# Patient Record
Sex: Female | Born: 1940
Health system: Southern US, Community
[De-identification: ages and names within clinical notes are randomized; demographics above are authoritative.]

## PROBLEM LIST (undated history)

## (undated) DIAGNOSIS — M5030 Other cervical disc degeneration, unspecified cervical region: Secondary | ICD-10-CM

## (undated) DIAGNOSIS — Z9889 Other specified postprocedural states: Secondary | ICD-10-CM

## (undated) DIAGNOSIS — R0600 Dyspnea, unspecified: Secondary | ICD-10-CM

## (undated) DIAGNOSIS — I700 Atherosclerosis of aorta: Secondary | ICD-10-CM

## (undated) DIAGNOSIS — F411 Generalized anxiety disorder: Secondary | ICD-10-CM

## (undated) DIAGNOSIS — K269 Duodenal ulcer, unspecified as acute or chronic, without hemorrhage or perforation: Secondary | ICD-10-CM

## (undated) DIAGNOSIS — Z8489 Family history of other specified conditions: Secondary | ICD-10-CM

## (undated) DIAGNOSIS — I10 Essential (primary) hypertension: Secondary | ICD-10-CM

## (undated) DIAGNOSIS — Z87898 Personal history of other specified conditions: Secondary | ICD-10-CM

## (undated) DIAGNOSIS — R519 Headache, unspecified: Secondary | ICD-10-CM

## (undated) DIAGNOSIS — M1990 Unspecified osteoarthritis, unspecified site: Secondary | ICD-10-CM

## (undated) DIAGNOSIS — J45909 Unspecified asthma, uncomplicated: Secondary | ICD-10-CM

## (undated) DIAGNOSIS — Z923 Personal history of irradiation: Secondary | ICD-10-CM

## (undated) DIAGNOSIS — Z9221 Personal history of antineoplastic chemotherapy: Secondary | ICD-10-CM

## (undated) DIAGNOSIS — K644 Residual hemorrhoidal skin tags: Secondary | ICD-10-CM

## (undated) DIAGNOSIS — R112 Nausea with vomiting, unspecified: Secondary | ICD-10-CM

## (undated) DIAGNOSIS — R001 Bradycardia, unspecified: Secondary | ICD-10-CM

## (undated) DIAGNOSIS — I351 Nonrheumatic aortic (valve) insufficiency: Secondary | ICD-10-CM

## (undated) DIAGNOSIS — E7800 Pure hypercholesterolemia, unspecified: Secondary | ICD-10-CM

## (undated) DIAGNOSIS — K859 Acute pancreatitis without necrosis or infection, unspecified: Secondary | ICD-10-CM

## (undated) DIAGNOSIS — C50919 Malignant neoplasm of unspecified site of unspecified female breast: Secondary | ICD-10-CM

## (undated) DIAGNOSIS — R51 Headache: Secondary | ICD-10-CM

## (undated) DIAGNOSIS — C801 Malignant (primary) neoplasm, unspecified: Secondary | ICD-10-CM

## (undated) DIAGNOSIS — E119 Type 2 diabetes mellitus without complications: Secondary | ICD-10-CM

## (undated) DIAGNOSIS — IMO0001 Reserved for inherently not codable concepts without codable children: Secondary | ICD-10-CM

## (undated) DIAGNOSIS — I639 Cerebral infarction, unspecified: Secondary | ICD-10-CM

## (undated) HISTORY — DX: Bradycardia, unspecified: R00.1

## (undated) HISTORY — DX: Acute pancreatitis without necrosis or infection, unspecified: K85.90

## (undated) HISTORY — DX: Cerebral infarction, unspecified: I63.9

## (undated) HISTORY — DX: Atherosclerosis of aorta: I70.0

## (undated) HISTORY — DX: Generalized anxiety disorder: F41.1

## (undated) HISTORY — PX: APPENDECTOMY: SHX54

## (undated) HISTORY — DX: Residual hemorrhoidal skin tags: K64.4

## (undated) HISTORY — DX: Headache, unspecified: R51.9

## (undated) HISTORY — DX: Pure hypercholesterolemia, unspecified: E78.00

## (undated) HISTORY — PX: ABDOMINAL HYSTERECTOMY: SHX81

## (undated) HISTORY — PX: CHOLECYSTECTOMY: SHX55

## (undated) HISTORY — DX: Malignant (primary) neoplasm, unspecified: C80.1

## (undated) HISTORY — DX: Headache: R51

## (undated) HISTORY — DX: Duodenal ulcer, unspecified as acute or chronic, without hemorrhage or perforation: K26.9

## (undated) HISTORY — DX: Nonrheumatic aortic (valve) insufficiency: I35.1

## (undated) HISTORY — PX: BREAST SURGERY: SHX581

## (undated) HISTORY — DX: Personal history of other specified conditions: Z87.898

## (undated) HISTORY — PX: TUBAL LIGATION: SHX77

## (undated) HISTORY — PX: OTHER SURGICAL HISTORY: SHX169

---

## 1991-09-13 HISTORY — PX: MASTECTOMY: SHX3

## 1998-01-17 ENCOUNTER — Emergency Department (HOSPITAL_COMMUNITY): Admission: EM | Admit: 1998-01-17 | Discharge: 1998-01-17 | Payer: Self-pay | Admitting: Emergency Medicine

## 1998-01-18 ENCOUNTER — Emergency Department (HOSPITAL_COMMUNITY): Admission: EM | Admit: 1998-01-18 | Discharge: 1998-01-18 | Payer: Self-pay | Admitting: Emergency Medicine

## 1998-04-08 ENCOUNTER — Other Ambulatory Visit: Admission: RE | Admit: 1998-04-08 | Discharge: 1998-04-08 | Payer: Self-pay | Admitting: Hematology and Oncology

## 1998-05-28 ENCOUNTER — Emergency Department (HOSPITAL_COMMUNITY): Admission: EM | Admit: 1998-05-28 | Discharge: 1998-05-28 | Payer: Self-pay | Admitting: Emergency Medicine

## 1999-08-26 ENCOUNTER — Encounter: Admission: RE | Admit: 1999-08-26 | Discharge: 1999-08-26 | Payer: Self-pay | Admitting: Hematology and Oncology

## 1999-08-26 ENCOUNTER — Encounter: Payer: Self-pay | Admitting: Hematology and Oncology

## 1999-10-04 ENCOUNTER — Encounter: Admission: RE | Admit: 1999-10-04 | Discharge: 1999-10-04 | Payer: Self-pay | Admitting: Family Medicine

## 1999-10-04 ENCOUNTER — Encounter: Payer: Self-pay | Admitting: Family Medicine

## 1999-11-29 ENCOUNTER — Encounter: Admission: RE | Admit: 1999-11-29 | Discharge: 1999-11-29 | Payer: Self-pay | Admitting: Hematology and Oncology

## 1999-11-29 ENCOUNTER — Encounter: Payer: Self-pay | Admitting: Oncology

## 2001-05-11 ENCOUNTER — Encounter: Payer: Self-pay | Admitting: Oncology

## 2001-05-11 ENCOUNTER — Encounter: Admission: RE | Admit: 2001-05-11 | Discharge: 2001-05-11 | Payer: Self-pay | Admitting: Oncology

## 2001-07-16 ENCOUNTER — Encounter: Payer: Self-pay | Admitting: Family Medicine

## 2001-07-16 ENCOUNTER — Encounter: Admission: RE | Admit: 2001-07-16 | Discharge: 2001-07-16 | Payer: Self-pay | Admitting: Family Medicine

## 2002-01-30 ENCOUNTER — Encounter: Admission: RE | Admit: 2002-01-30 | Discharge: 2002-01-30 | Payer: Self-pay | Admitting: Family Medicine

## 2002-01-30 ENCOUNTER — Encounter: Payer: Self-pay | Admitting: Family Medicine

## 2002-06-11 ENCOUNTER — Encounter: Payer: Self-pay | Admitting: Oncology

## 2002-06-11 ENCOUNTER — Encounter: Admission: RE | Admit: 2002-06-11 | Discharge: 2002-06-11 | Payer: Self-pay | Admitting: Oncology

## 2003-09-13 DIAGNOSIS — IMO0001 Reserved for inherently not codable concepts without codable children: Secondary | ICD-10-CM

## 2003-09-13 HISTORY — DX: Reserved for inherently not codable concepts without codable children: IMO0001

## 2003-12-25 ENCOUNTER — Inpatient Hospital Stay (HOSPITAL_COMMUNITY): Admission: EM | Admit: 2003-12-25 | Discharge: 2003-12-25 | Payer: Self-pay | Admitting: *Deleted

## 2004-02-18 ENCOUNTER — Encounter: Admission: RE | Admit: 2004-02-18 | Discharge: 2004-02-18 | Payer: Self-pay | Admitting: Obstetrics and Gynecology

## 2005-09-19 ENCOUNTER — Encounter: Admission: RE | Admit: 2005-09-19 | Discharge: 2005-09-19 | Payer: Self-pay | Admitting: Family Medicine

## 2006-03-24 DIAGNOSIS — Z87898 Personal history of other specified conditions: Secondary | ICD-10-CM

## 2006-03-24 HISTORY — DX: Personal history of other specified conditions: Z87.898

## 2006-03-28 DIAGNOSIS — I351 Nonrheumatic aortic (valve) insufficiency: Secondary | ICD-10-CM

## 2006-03-28 HISTORY — DX: Nonrheumatic aortic (valve) insufficiency: I35.1

## 2006-03-30 ENCOUNTER — Ambulatory Visit (HOSPITAL_COMMUNITY): Admission: RE | Admit: 2006-03-30 | Discharge: 2006-03-30 | Payer: Self-pay | Admitting: Cardiovascular Disease

## 2007-01-14 ENCOUNTER — Emergency Department (HOSPITAL_COMMUNITY): Admission: EM | Admit: 2007-01-14 | Discharge: 2007-01-14 | Payer: Self-pay | Admitting: Family Medicine

## 2007-02-07 ENCOUNTER — Ambulatory Visit: Payer: Self-pay | Admitting: Internal Medicine

## 2007-03-01 ENCOUNTER — Ambulatory Visit: Payer: Self-pay | Admitting: Internal Medicine

## 2007-04-19 ENCOUNTER — Ambulatory Visit: Payer: Self-pay | Admitting: Internal Medicine

## 2007-04-23 ENCOUNTER — Ambulatory Visit (HOSPITAL_COMMUNITY): Admission: RE | Admit: 2007-04-23 | Discharge: 2007-04-23 | Payer: Self-pay | Admitting: Internal Medicine

## 2007-06-28 ENCOUNTER — Emergency Department (HOSPITAL_COMMUNITY): Admission: EM | Admit: 2007-06-28 | Discharge: 2007-06-28 | Payer: Self-pay | Admitting: Emergency Medicine

## 2007-06-30 DIAGNOSIS — J45909 Unspecified asthma, uncomplicated: Secondary | ICD-10-CM | POA: Insufficient documentation

## 2007-06-30 DIAGNOSIS — R058 Other specified cough: Secondary | ICD-10-CM | POA: Insufficient documentation

## 2007-06-30 DIAGNOSIS — C50919 Malignant neoplasm of unspecified site of unspecified female breast: Secondary | ICD-10-CM | POA: Insufficient documentation

## 2007-06-30 DIAGNOSIS — R05 Cough: Secondary | ICD-10-CM

## 2007-07-02 ENCOUNTER — Ambulatory Visit: Payer: Self-pay | Admitting: Internal Medicine

## 2007-11-13 ENCOUNTER — Encounter: Payer: Self-pay | Admitting: Internal Medicine

## 2007-11-26 ENCOUNTER — Ambulatory Visit: Payer: Self-pay | Admitting: Internal Medicine

## 2007-11-28 ENCOUNTER — Telehealth: Payer: Self-pay | Admitting: Internal Medicine

## 2007-12-17 ENCOUNTER — Ambulatory Visit: Payer: Self-pay | Admitting: Internal Medicine

## 2008-01-06 ENCOUNTER — Encounter: Admission: RE | Admit: 2008-01-06 | Discharge: 2008-01-06 | Payer: Self-pay | Admitting: Family Medicine

## 2008-01-21 ENCOUNTER — Encounter: Payer: Self-pay | Admitting: Internal Medicine

## 2008-01-31 ENCOUNTER — Encounter: Payer: Self-pay | Admitting: Internal Medicine

## 2008-02-13 ENCOUNTER — Encounter: Payer: Self-pay | Admitting: Internal Medicine

## 2008-02-18 ENCOUNTER — Ambulatory Visit: Payer: Self-pay | Admitting: Internal Medicine

## 2008-02-27 ENCOUNTER — Encounter: Payer: Self-pay | Admitting: Internal Medicine

## 2008-02-29 ENCOUNTER — Encounter: Payer: Self-pay | Admitting: Internal Medicine

## 2008-03-19 ENCOUNTER — Ambulatory Visit: Payer: Self-pay | Admitting: Internal Medicine

## 2008-03-24 DIAGNOSIS — K219 Gastro-esophageal reflux disease without esophagitis: Secondary | ICD-10-CM | POA: Insufficient documentation

## 2008-08-27 ENCOUNTER — Encounter: Admission: RE | Admit: 2008-08-27 | Discharge: 2008-08-27 | Payer: Self-pay | Admitting: Family Medicine

## 2008-09-19 ENCOUNTER — Ambulatory Visit: Payer: Self-pay | Admitting: Internal Medicine

## 2010-05-08 ENCOUNTER — Emergency Department (HOSPITAL_COMMUNITY): Admission: EM | Admit: 2010-05-08 | Discharge: 2010-05-08 | Payer: Self-pay | Admitting: Family Medicine

## 2010-05-12 ENCOUNTER — Encounter: Admission: RE | Admit: 2010-05-12 | Discharge: 2010-05-12 | Payer: Self-pay | Admitting: Family Medicine

## 2010-11-07 ENCOUNTER — Ambulatory Visit (INDEPENDENT_AMBULATORY_CARE_PROVIDER_SITE_OTHER): Payer: Medicare Other

## 2010-11-07 ENCOUNTER — Inpatient Hospital Stay (INDEPENDENT_AMBULATORY_CARE_PROVIDER_SITE_OTHER)
Admission: RE | Admit: 2010-11-07 | Discharge: 2010-11-07 | Disposition: A | Discharge status: Home / Self Care | Payer: Medicare Other | Source: Ambulatory Visit | Attending: Emergency Medicine | Admitting: Emergency Medicine

## 2010-11-07 DIAGNOSIS — S90129A Contusion of unspecified lesser toe(s) without damage to nail, initial encounter: Secondary | ICD-10-CM

## 2010-12-08 ENCOUNTER — Inpatient Hospital Stay (INDEPENDENT_AMBULATORY_CARE_PROVIDER_SITE_OTHER)
Admission: RE | Admit: 2010-12-08 | Discharge: 2010-12-08 | Disposition: A | Discharge status: Home / Self Care | Payer: Medicare Other | Source: Ambulatory Visit | Attending: Family Medicine | Admitting: Family Medicine

## 2010-12-08 DIAGNOSIS — J069 Acute upper respiratory infection, unspecified: Secondary | ICD-10-CM

## 2011-01-25 NOTE — Assessment & Plan Note (Signed)
[facility]                             PULMONARY OFFICE NOTE   NAME:[name], [name]                        MRN:          [id number]  DATE:04/19/2007                            DOB:          [birth year]    PROBLEM:  1. Cough.  2. Asthma.  3. History of breast cancer/radiation/chemotherapy.   HISTORY:  Returns for followup reporting cough has been better, but  noting that cold drinks tend to trigger cough.  She feels she needs to  clear her throat repeatedly.  Symbicort seems to have helped some.   MEDICATIONS:  1. Singulair 10 mg.  2. Symbicort 160/4.5 two puffs b.i.d.  3. She has had some Phenergan with codeine cough syrup.   NO MEDICATION ALLERGY.   OBJECTIVE:  Weight 165 pounds.  BP 122/74.  Pulse 56.  Room air  saturation 97%.  Pharynx is reddened.  Voice quality is normal.  There is no hoarseness  or stridor.  No adenopathy.  No thyromegaly.  LUNGS:  Quiet and clear with no coughing noted now.  HEART:  Sounds normal.   IMPRESSION:  Relationship to cold drinks suggests that she may be  aspirating, or at least having some laryngopharyngeal penetration, which  we may be able to assess with a modified barium swallow evaluation.   PLAN:  1. Refill Singulair 10 mg.  2. Refill Symbicort 160/4.5.  3. Schedule modified barium swallow with speech therapy assist.  4. Schedule return in 1 month.  We may need to do a standard barium      swallow, depending on results of the speech therapy evaluation.     Clinton D. [name], MD, Tonny Bollman, FACP  Electronically Signed   CDY/MedQ  DD: 04/21/2007  DT: 04/22/2007  Job #: [id number]   cc:   Vesta Mixer, M.D.  Elsworth Soho, M.D.

## 2011-01-25 NOTE — Assessment & Plan Note (Signed)
[facility]                             PULMONARY OFFICE NOTE   NAME:[name], [name]                        MRN:          [id number]  DATE:02/07/2007                            DOB:          [birth year]    PROBLEM:  Pulmonary consultation at the kind request of Dr. Elease Hashimoto for  this 70 year old woman who complains of cough and intermittent dyspnea.   HISTORY:  She says that this has been going on episodically for six  months. She describes coughing spells during which she loses her breath.  She has to do a lot of throat clearing. She has averaged bronchitis  about twice a year with no real pattern. There is usually scant mucus  coughed up. She wakes occasionally feeling somewhat smothered and has to  sit up for a bit to get her breath. She does not recognize seasonal  rhinitis symptoms. She has had some wheeze occasionally and was told at  an urgent care that she had asthma. She recognizes no relationship to  swallowing or eating except that cold liquids sometimes will trigger  cough. There is no pattern of season, weather, location, or exposure.  She notices sinus drainage only when she has had a flare up of her  bronchitis. She completely denies reflux. Her cough is felt mostly at  the level of her throat. She has been Singulair about a year.   MEDICATIONS:  Limited to Singulair 10 mg.   ALLERGIES:  No medication allergy.   REVIEW OF SYSTEMS:  Shortness of breath with activity and at rest,  productive cough. Between flare ups she usually feels well. She is never  aware of reflux or heartburn and she denies fever, sweat, adenopathy, or  rash. Her weight has been stable. She tends to feel tired, but does  not give me symptoms otherwise suggestive of sleep apnea, except that  she wakes occasionally feeling smothered. She cannot define that very  well.   PAST HISTORY:  Breast cancer in 1993 with 9 months of chemotherapy,  right mastectomy and  radiation therapy. Asthma as a child and diagnosed  by an urgent care subsequently as an adult. She says that a work up by  Dr. Elease Hashimoto was negative for cardiac issues related to complaints of  chest pain which is now improved. She has had cholecystectomy, complete  hysterectomy, right mastectomy, and appendectomy. No problems with  latex, contrast dye, or aspirin.   SOCIAL HISTORY:  Never smoked. No alcohol or street drugs. 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   FAMILY HISTORY:  Mother with asthma, both parents with heart disease,  sister had colon cancer.   OBJECTIVE:  VITAL SIGNS:  Weight 160 pounds, blood pressure 148/80,  pulse 87, room air saturation 97%.  GENERAL:  This is a comfortable appearing woman.  SKIN:  No evident rash.  ADENOPATHY:  None at the neck or supraclavicular areas.  HEENT:  Ears, nose, and throat are clear. She has her teeth. Voice  quality is normal, but I  do notice a frequent throat clearing. I see no  glandularity, erythema, or other evidence of reflux to the throat level.  CHEST:  Clear, quiet, breath sounds, unlabored.  HEART:  Sounds are regular without murmur or gallop.  ABDOMEN:  No enlargement of liver or spleen.  EXTREMITIES:  No cyanosis, clubbing, or edema.   LABORATORY DATA:  Chest x-ray done May 8th described no acute  cardiopulmonary findings. There was some mild chronic bronchitic change  and post-op changes of a right mastectomy with lymph node dissection.   IMPRESSION:  1. Multifactorial cough.  2. Asthma.  3. History of breast cancer with radiation and chemotherapy.   PLAN:  1. Schedule PFT.  2. Try Symbicort 160/4.5 2 puffs b.i.d. with discussion.  3. Phenergan with codeine cough syrup, 5 ml, every 6 hours p.r.n.,      occasional use only when cough is a significant problem.  4. Reflux precautions.  5. Schedule return 2-4 weeks as able. We will look again at her      symptoms  and consider whether additional tests or medication      modifications would be important for her. Consider GI evaluation      for status of her reflux complaints.     Leslie D. [name], MD, Tonny Bollman, FACP  Electronically Signed    CDY/MedQ  DD: 02/11/2007  DT: 02/12/2007  Job #: [id number]   cc:   Vesta Mixer, M.D.  Elsworth Soho, M.D.

## 2011-01-25 NOTE — Assessment & Plan Note (Signed)
[facility]                             PULMONARY OFFICE NOTE   NAME:[name]                        MRN:          [id number]  DATE:07/02/2007                            DOB:          [birth year]    PROBLEM LIST:  1. Cough.  2. Asthma with bronchitis.  3. History of breast cancer with radiation and chemotherapy.  4. Rhinosinusitis.   HISTORY:  She returns now reporting 4 days on amoxicillin (or  Augmentin?) b.i.d. from an urgent care for sinusitis.  She is using a  nasal decongestant spray.  Purulent, thick nasal discharge, head stopped  up, no fever, some cough with this illness.  Otherwise, her cough had  done much better using Symbicort 160/4.5 and Singulair.  She has had flu  shot.  Modified barium swallow on August 11showed adequate clearance, no  definite penetration, although she did cough to protect her airway.  They recommended only that she eat and drink only when alert and follow  standard reflux precautions with regular diet and thin liquids.   MEDICATIONS:  1. Singulair 10 mg.  2. Symbicort 160/4.5.   She does have some codeine cough syrup used occasionally.   No medication allergy.   Currently on an amoxicillin product.   OBJECTIVE:  Weight 162 pounds, BP 116/64, pulse 68, room air saturation  98%.  Her turbinates are somewhat edematous.  Pharynx is a little bit  reddened.  There is no adenopathy, no obvious postnasal drainage.  Tympanic membranes are clear.  Conjunctivae are clear.  CHEST:  Clear.   IMPRESSION:  Rhinosinusitis.  Plain amoxicillin would not likely be  sufficient prescribed for 5 days.  If she is on generic Augmentin that  may prove sufficient, but I have discussed giving her an extension on  her antibiotic coverage.  Cough has responded to bronchodilators, and is  not clearly associated with a reflux aspiration process, so we will  treat it as an asthmatic bronchitis.   PLAN:  1. Finish current  antibiotic prescription.  If she is not cleared by      that time, she is given doxycycline 100 mg b.i.d. for 7 days.  2. Neo-Synephrine inhalation with Depo-Medrol 40 mg IM.  3. Continue Singulair and Symbicort.  4. May need saline lavage.  5. Schedule return 4 months, earlier p.r.n.     Clinton D. [name], MD, Tonny Bollman, FACP  Electronically Signed    CDY/MedQ  DD: 07/02/2007  DT: 07/03/2007  Job #: [id number]   cc:   L. Lupe Carney, M.D.  Vesta Mixer, M.D.

## 2011-01-28 NOTE — Consult Note (Signed)
[name], [name]                           ACCOUNT NO.:  [id number]   MEDICAL RECORD NO.:  [id number]                   PATIENT TYPE:  INP   LOCATION:  4742                                 FACILITY:  [facility]   PHYSICIAN:  Lesleigh Noe, M.D.            DATE OF BIRTH:  03-08-1941   DATE OF CONSULTATION:  12/25/2003  DATE OF DISCHARGE:  12/25/2003                                   CONSULTATION   REASON FOR CONSULTATION:  Chest discomfort.   CONCLUSION:  1. Chest discomfort characterized as being a fluttering in her chest lasting     minutes, etiology uncertain.  Rule out arrhythmia.  Rule out coronary     artery disease.  2. History of breast cancer, status post right mastectomy in 1995.   RECOMMENDATIONS:  1. Enzymes to rule out myocardial infarction.  2. Serial EKGs.  3. Replete potassium which could be causing palpitations/arrhythmia.  4. Cardiolite, rule out ischemic heart disease.   HISTORY OF PRESENT ILLNESS:  The patient is 41 and has a rather sudden onset  and history of fluttering/chest discomfort in the midsternal region.  She  had never before had this and trying to get her to describe this it was  really more of a fluttering than an actual discomfort.  These episodes will  last off and on for up to 30 minutes.  There was no radiation, shortness of  breath, diaphoresis, or other complaints.  Physical activity did not seem to  influence it.  She is currently without any symptoms.   SOCIAL HISTORY:  Married.  Has children.  She works as a Midwife.   FAMILY HISTORY:  Father died of coronary disease as did her mother.  One  sister has had a myocardial infarction.   ALLERGIES:  None.   MEDICATIONS:  None.   PAST MEDICAL HISTORY:  Breast cancer.   PHYSICAL EXAMINATION:  VITAL SIGNS:  Blood pressure 130/70, heart rate 60.  O2 saturation 99% on room air.  HEENT:  Unremarkable.  NECK:  No JVD, adenopathy, or thyromegaly.  LUNGS:  Clear.  CARDIOVASCULAR:  No gallop, no click, no rub, and no murmur.  ABDOMEN:  Soft.  Bowel sounds normal.  EXTREMITIES:  No edema.  PULSES:  Pulses are 2+ and symmetric in the upper and lower extremities.   LABORATORY DATA:  EKG is normal.   Potassium of 3.3.  BUN and creatinine are normal.  Hemoglobin 12.8.  CK-MB  and troponins x 2 were negative.                                               Lesleigh Noe, M.D.    HWS/MEDQ  D:  12/25/2003  T:  12/25/2003  Job:  [id number]

## 2011-04-20 ENCOUNTER — Inpatient Hospital Stay (INDEPENDENT_AMBULATORY_CARE_PROVIDER_SITE_OTHER)
Admission: RE | Admit: 2011-04-20 | Discharge: 2011-04-20 | Disposition: A | Discharge status: Home / Self Care | Payer: Medicare Other | Source: Ambulatory Visit | Attending: Emergency Medicine | Admitting: Emergency Medicine

## 2011-04-20 DIAGNOSIS — J45909 Unspecified asthma, uncomplicated: Secondary | ICD-10-CM

## 2011-05-12 ENCOUNTER — Other Ambulatory Visit: Payer: Self-pay | Admitting: Family Medicine

## 2011-05-12 DIAGNOSIS — Z1231 Encounter for screening mammogram for malignant neoplasm of breast: Secondary | ICD-10-CM

## 2011-05-12 DIAGNOSIS — Z9011 Acquired absence of right breast and nipple: Secondary | ICD-10-CM

## 2011-05-20 ENCOUNTER — Ambulatory Visit: Payer: Medicare Other

## 2011-05-24 ENCOUNTER — Ambulatory Visit: Payer: Medicare Other

## 2011-05-31 ENCOUNTER — Ambulatory Visit: Payer: Medicare Other

## 2011-08-31 ENCOUNTER — Ambulatory Visit
Admission: RE | Admit: 2011-08-31 | Discharge: 2011-08-31 | Disposition: A | Discharge status: Home / Self Care | Payer: Medicare Other | Source: Ambulatory Visit | Attending: Family Medicine | Admitting: Family Medicine

## 2011-08-31 DIAGNOSIS — Z1231 Encounter for screening mammogram for malignant neoplasm of breast: Secondary | ICD-10-CM

## 2011-08-31 DIAGNOSIS — Z9011 Acquired absence of right breast and nipple: Secondary | ICD-10-CM

## 2011-11-25 DIAGNOSIS — L259 Unspecified contact dermatitis, unspecified cause: Secondary | ICD-10-CM | POA: Diagnosis not present

## 2011-12-29 DIAGNOSIS — N760 Acute vaginitis: Secondary | ICD-10-CM | POA: Diagnosis not present

## 2011-12-29 DIAGNOSIS — T180XXA Foreign body in mouth, initial encounter: Secondary | ICD-10-CM | POA: Diagnosis not present

## 2012-01-12 DIAGNOSIS — M25539 Pain in unspecified wrist: Secondary | ICD-10-CM | POA: Diagnosis not present

## 2012-01-12 DIAGNOSIS — M654 Radial styloid tenosynovitis [de Quervain]: Secondary | ICD-10-CM | POA: Diagnosis not present

## 2012-01-18 ENCOUNTER — Encounter: Payer: Self-pay | Admitting: *Deleted

## 2012-01-18 DIAGNOSIS — Z87898 Personal history of other specified conditions: Secondary | ICD-10-CM | POA: Insufficient documentation

## 2012-01-18 DIAGNOSIS — R001 Bradycardia, unspecified: Secondary | ICD-10-CM | POA: Insufficient documentation

## 2012-01-18 DIAGNOSIS — I351 Nonrheumatic aortic (valve) insufficiency: Secondary | ICD-10-CM | POA: Insufficient documentation

## 2012-03-23 DIAGNOSIS — H02839 Dermatochalasis of unspecified eye, unspecified eyelid: Secondary | ICD-10-CM | POA: Diagnosis not present

## 2012-03-23 DIAGNOSIS — H02059 Trichiasis without entropian unspecified eye, unspecified eyelid: Secondary | ICD-10-CM | POA: Diagnosis not present

## 2012-03-23 DIAGNOSIS — H02109 Unspecified ectropion of unspecified eye, unspecified eyelid: Secondary | ICD-10-CM | POA: Diagnosis not present

## 2012-03-26 DIAGNOSIS — H1010 Acute atopic conjunctivitis, unspecified eye: Secondary | ICD-10-CM | POA: Diagnosis not present

## 2012-04-09 DIAGNOSIS — H0230 Blepharochalasis unspecified eye, unspecified eyelid: Secondary | ICD-10-CM | POA: Diagnosis not present

## 2012-04-16 ENCOUNTER — Telehealth: Payer: Self-pay | Admitting: Cardiovascular Disease

## 2012-04-16 NOTE — Telephone Encounter (Signed)
Cp off and on/ pt states cant tell if its from neck pain, sob with exertion but c/o cough/ no SOB in speech, denies edema, neck pain at base of neck/ right side, symptoms relieved with Alieve but returns when meds wear off. Started 3 days ago. Pt was seen several years ago and is only on ASA. Asked pt to start with pcp for eval and they will refer to cardiology if need. Pt was given a f/u/ re establish app for 2 months. Pt agreed to plan.

## 2012-04-16 NOTE — Telephone Encounter (Signed)
Pt having chest pain and pain in neck, SOB, for 4 days, pt states last time seen 1-2 years ago, pls advise

## 2012-04-19 DIAGNOSIS — R5383 Other fatigue: Secondary | ICD-10-CM | POA: Diagnosis not present

## 2012-04-19 DIAGNOSIS — R5381 Other malaise: Secondary | ICD-10-CM | POA: Diagnosis not present

## 2012-04-19 DIAGNOSIS — R059 Cough, unspecified: Secondary | ICD-10-CM | POA: Diagnosis not present

## 2012-04-19 DIAGNOSIS — M542 Cervicalgia: Secondary | ICD-10-CM | POA: Diagnosis not present

## 2012-04-19 DIAGNOSIS — R05 Cough: Secondary | ICD-10-CM | POA: Diagnosis not present

## 2012-04-23 DIAGNOSIS — Z961 Presence of intraocular lens: Secondary | ICD-10-CM | POA: Diagnosis not present

## 2012-05-22 ENCOUNTER — Encounter: Payer: Self-pay | Admitting: Cardiovascular Disease

## 2012-05-24 ENCOUNTER — Encounter: Payer: Self-pay | Admitting: Cardiovascular Disease

## 2012-05-30 ENCOUNTER — Telehealth: Payer: Self-pay | Admitting: *Deleted

## 2012-05-30 ENCOUNTER — Ambulatory Visit: Payer: Medicare Other | Admitting: Cardiovascular Disease

## 2012-05-30 NOTE — Telephone Encounter (Signed)
Spoke to patient and she states her PCP told her she looks fine. Pt states she is doing fine. Pt states she will call back back to reschedule an appointment.Marland Kitchen

## 2012-06-04 ENCOUNTER — Encounter: Payer: Self-pay | Admitting: Cardiovascular Disease

## 2012-07-23 DIAGNOSIS — R079 Chest pain, unspecified: Secondary | ICD-10-CM | POA: Diagnosis not present

## 2012-08-30 DIAGNOSIS — A088 Other specified intestinal infections: Secondary | ICD-10-CM | POA: Diagnosis not present

## 2012-11-06 DIAGNOSIS — R030 Elevated blood-pressure reading, without diagnosis of hypertension: Secondary | ICD-10-CM | POA: Diagnosis not present

## 2012-11-15 DIAGNOSIS — I10 Essential (primary) hypertension: Secondary | ICD-10-CM | POA: Diagnosis not present

## 2012-11-15 DIAGNOSIS — J309 Allergic rhinitis, unspecified: Secondary | ICD-10-CM | POA: Diagnosis not present

## 2012-11-15 DIAGNOSIS — Z0000 Encounter for general adult medical examination without abnormal findings: Secondary | ICD-10-CM | POA: Diagnosis not present

## 2012-11-15 DIAGNOSIS — Z1211 Encounter for screening for malignant neoplasm of colon: Secondary | ICD-10-CM | POA: Diagnosis not present

## 2012-11-15 DIAGNOSIS — K2100 Gastro-esophageal reflux disease with esophagitis, without bleeding: Secondary | ICD-10-CM | POA: Diagnosis not present

## 2012-11-15 DIAGNOSIS — Z136 Encounter for screening for cardiovascular disorders: Secondary | ICD-10-CM | POA: Diagnosis not present

## 2012-11-15 DIAGNOSIS — Z23 Encounter for immunization: Secondary | ICD-10-CM | POA: Diagnosis not present

## 2012-11-15 DIAGNOSIS — R7301 Impaired fasting glucose: Secondary | ICD-10-CM | POA: Diagnosis not present

## 2012-12-14 DIAGNOSIS — R059 Cough, unspecified: Secondary | ICD-10-CM | POA: Diagnosis not present

## 2012-12-14 DIAGNOSIS — J45901 Unspecified asthma with (acute) exacerbation: Secondary | ICD-10-CM | POA: Diagnosis not present

## 2012-12-14 DIAGNOSIS — R05 Cough: Secondary | ICD-10-CM | POA: Diagnosis not present

## 2012-12-20 DIAGNOSIS — J209 Acute bronchitis, unspecified: Secondary | ICD-10-CM | POA: Diagnosis not present

## 2012-12-20 DIAGNOSIS — J45909 Unspecified asthma, uncomplicated: Secondary | ICD-10-CM | POA: Diagnosis not present

## 2013-01-04 ENCOUNTER — Institutional Professional Consult (permissible substitution): Payer: Medicare Other | Admitting: Internal Medicine

## 2013-02-11 DIAGNOSIS — L259 Unspecified contact dermatitis, unspecified cause: Secondary | ICD-10-CM | POA: Diagnosis not present

## 2013-02-11 DIAGNOSIS — R109 Unspecified abdominal pain: Secondary | ICD-10-CM | POA: Diagnosis not present

## 2013-02-18 DIAGNOSIS — Z1211 Encounter for screening for malignant neoplasm of colon: Secondary | ICD-10-CM | POA: Diagnosis not present

## 2013-04-22 DIAGNOSIS — H10509 Unspecified blepharoconjunctivitis, unspecified eye: Secondary | ICD-10-CM | POA: Diagnosis not present

## 2013-05-09 DIAGNOSIS — Z961 Presence of intraocular lens: Secondary | ICD-10-CM | POA: Diagnosis not present

## 2013-05-15 DIAGNOSIS — H04129 Dry eye syndrome of unspecified lacrimal gland: Secondary | ICD-10-CM | POA: Diagnosis not present

## 2013-05-22 DIAGNOSIS — J069 Acute upper respiratory infection, unspecified: Secondary | ICD-10-CM | POA: Diagnosis not present

## 2013-06-11 ENCOUNTER — Other Ambulatory Visit: Payer: Self-pay

## 2013-06-11 DIAGNOSIS — Z1231 Encounter for screening mammogram for malignant neoplasm of breast: Secondary | ICD-10-CM

## 2013-06-11 DIAGNOSIS — Z9011 Acquired absence of right breast and nipple: Secondary | ICD-10-CM

## 2013-06-11 DIAGNOSIS — Z853 Personal history of malignant neoplasm of breast: Secondary | ICD-10-CM

## 2013-06-12 DIAGNOSIS — J45909 Unspecified asthma, uncomplicated: Secondary | ICD-10-CM | POA: Diagnosis not present

## 2013-06-12 DIAGNOSIS — M546 Pain in thoracic spine: Secondary | ICD-10-CM | POA: Diagnosis not present

## 2013-06-12 DIAGNOSIS — R05 Cough: Secondary | ICD-10-CM | POA: Diagnosis not present

## 2013-06-12 DIAGNOSIS — N644 Mastodynia: Secondary | ICD-10-CM | POA: Diagnosis not present

## 2013-06-12 DIAGNOSIS — R059 Cough, unspecified: Secondary | ICD-10-CM | POA: Diagnosis not present

## 2013-06-12 DIAGNOSIS — K2100 Gastro-esophageal reflux disease with esophagitis, without bleeding: Secondary | ICD-10-CM | POA: Diagnosis not present

## 2013-06-13 ENCOUNTER — Ambulatory Visit
Admission: RE | Admit: 2013-06-13 | Discharge: 2013-06-13 | Disposition: A | Discharge status: Home / Self Care | Payer: Medicare Other | Source: Ambulatory Visit

## 2013-06-13 DIAGNOSIS — Z853 Personal history of malignant neoplasm of breast: Secondary | ICD-10-CM

## 2013-06-13 DIAGNOSIS — Z9011 Acquired absence of right breast and nipple: Secondary | ICD-10-CM

## 2013-06-13 DIAGNOSIS — Z1231 Encounter for screening mammogram for malignant neoplasm of breast: Secondary | ICD-10-CM

## 2013-08-15 DIAGNOSIS — H04129 Dry eye syndrome of unspecified lacrimal gland: Secondary | ICD-10-CM | POA: Diagnosis not present

## 2013-08-15 DIAGNOSIS — Z961 Presence of intraocular lens: Secondary | ICD-10-CM | POA: Diagnosis not present

## 2013-08-15 DIAGNOSIS — D492 Neoplasm of unspecified behavior of bone, soft tissue, and skin: Secondary | ICD-10-CM | POA: Diagnosis not present

## 2013-08-19 DIAGNOSIS — R059 Cough, unspecified: Secondary | ICD-10-CM | POA: Diagnosis not present

## 2013-08-19 DIAGNOSIS — R05 Cough: Secondary | ICD-10-CM | POA: Diagnosis not present

## 2013-09-07 ENCOUNTER — Encounter (HOSPITAL_COMMUNITY): Payer: Self-pay | Admitting: Emergency Medicine

## 2013-09-07 ENCOUNTER — Emergency Department (INDEPENDENT_AMBULATORY_CARE_PROVIDER_SITE_OTHER)
Admission: EM | Admit: 2013-09-07 | Discharge: 2013-09-07 | Disposition: A | Discharge status: Home / Self Care | Payer: Medicare Other | Source: Home / Self Care | Attending: Emergency Medicine | Admitting: Emergency Medicine

## 2013-09-07 DIAGNOSIS — J329 Chronic sinusitis, unspecified: Secondary | ICD-10-CM

## 2013-09-07 DIAGNOSIS — R0982 Postnasal drip: Secondary | ICD-10-CM

## 2013-09-07 MED ORDER — BENZONATATE 100 MG PO CAPS: 100.0000 mg | ORAL_CAPSULE | Freq: Three times a day (TID) | ORAL | Status: DC | PRN

## 2013-09-07 MED ORDER — IPRATROPIUM BROMIDE 0.06 % NA SOLN: 2.0000 | Freq: Four times a day (QID) | NASAL | Status: DC

## 2013-09-07 NOTE — ED Notes (Signed)
Pt c/o cold sxs onset 2 days w/sxs that include: cough, runny nose, chest congestion, nauseas, fevers Denies: v/n/d, SOB, wheezing... Taking OTC cold meds w/no relief Alert w/no signs of acute distress.

## 2013-09-07 NOTE — ED Provider Notes (Signed)
Medical screening examination/treatment/procedure(s) were performed by non-physician practitioner and as supervising physician I was immediately available for consultation/collaboration.  Leslee Home, M.D.  Reuben Likes, MD 09/07/13 [phone]

## 2013-09-07 NOTE — ED Provider Notes (Signed)
CSN: [id number]     Arrival date & time 09/07/13  0907 History   First MD Initiated Contact with Patient 09/07/13 [phone]     Chief Complaint  Patient presents with  . URI   (Consider location/radiation/quality/duration/timing/severity/associated sxs/prior Treatment) HPI Comments: Patient reports she is recovering from recent mild URI. She states she has some persistent post nasal drainage and that the mucous accumulates in the back her throat and she tries to cough it up but often feels like she cannot clear it. This is causing some minor throat irritation , but no difficuly speaking, breathing or swallowing. No fever or dyspnea.   The history is provided by the patient.    Past Medical History  Diagnosis Date  . Mild aortic insufficiency 03/28/2006    echo  . H/O chest pain 03/24/2006    normal nuclear  study  . Sinus bradycardia on ECG   . Cancer     right breast mastectomy   Past Surgical History  Procedure Laterality Date  . Mastectomy  1993    right breast  . Other surgical history      appendectomy  . Other surgical history      hysterectomy  . Rectal fissure    . Childbirth      x 2  . Tubal ligation     Family History  Problem Relation Age of Onset  . Heart attack Mother   . Heart disease Mother   . Hypertension Mother   . Heart attack Father   . Heart disease Father   . Hypertension Sister   . Heart attack Sister   . Hypertension Sister   . Heart attack Sister    History  Substance Use Topics  . Smoking status: Never Smoker   . Smokeless tobacco: Not on file  . Alcohol Use: No   OB History   Grav Para Term Preterm Abortions TAB SAB Ect Mult Living                 Review of Systems  All other systems reviewed and are negative.    Allergies  Doxycycline  Home Medications   Current Outpatient Rx  Name  Route  Sig  Dispense  Refill  . aspirin 325 MG tablet   Oral   Take 325 mg by mouth daily.          BP 149/82  Pulse 62  Temp(Src)  98.5 F (36.9 C) (Oral)  Resp 18  SpO2 100% Physical Exam  Nursing note and vitals reviewed. Constitutional: She is oriented to person, place, and time. She appears well-developed and well-nourished. No distress.  HENT:  Head: Normocephalic and atraumatic.  Right Ear: Hearing, tympanic membrane, external ear and ear canal normal. No decreased hearing is noted.  Left Ear: Hearing, tympanic membrane, external ear and ear canal normal.  Nose: Nose normal.  Mouth/Throat: Uvula is midline, oropharynx is clear and moist and mucous membranes are normal.  Eyes: Conjunctivae are normal. Right eye exhibits no discharge. Left eye exhibits no discharge. No scleral icterus.  Neck: Normal range of motion. Neck supple.  Cardiovascular: Normal rate, regular rhythm and normal heart sounds.   Pulmonary/Chest: Effort normal and breath sounds normal. No respiratory distress.  Abdominal: There is no tenderness.  Musculoskeletal: Normal range of motion.  Lymphadenopathy:    She has no cervical adenopathy.  Neurological: She is alert and oriented to person, place, and time.  Skin: Skin is warm and dry.  Psychiatric: She has a  normal mood and affect. Her behavior is normal.    ED Course  Procedures (including critical care time) Labs Review Labs Reviewed - No data to display Imaging Review No results found.  EKG Interpretation    Date/Time:    Ventricular Rate:    PR Interval:    QRS Duration:   QT Interval:    QTC Calculation:   R Axis:     Text Interpretation:              MDM  Patient states she is most bothered by mucous at the back of her throat that she feels she cannot clear or that she needs to cough up. Denies sore throat. Will treat with atrovent nasal spray and tessalon for cough and advise PCP follow up if symptoms do not improve.    Farrell Broerman [place], [place] 09/07/13 [phone]

## 2013-09-09 ENCOUNTER — Encounter (HOSPITAL_COMMUNITY): Payer: Self-pay | Admitting: Emergency Medicine

## 2013-09-09 ENCOUNTER — Emergency Department (INDEPENDENT_AMBULATORY_CARE_PROVIDER_SITE_OTHER)
Admission: EM | Admit: 2013-09-09 | Discharge: 2013-09-09 | Disposition: A | Discharge status: Home / Self Care | Payer: Medicare Other | Source: Home / Self Care | Attending: Emergency Medicine | Admitting: Emergency Medicine

## 2013-09-09 DIAGNOSIS — J45909 Unspecified asthma, uncomplicated: Secondary | ICD-10-CM | POA: Diagnosis not present

## 2013-09-09 MED ORDER — HYDROCOD POLST-CHLORPHEN POLST 10-8 MG/5ML PO LQCR: 5.0000 mL | Freq: Two times a day (BID) | ORAL | Status: DC | PRN

## 2013-09-09 MED ORDER — METHYLPREDNISOLONE ACETATE 80 MG/ML IJ SUSP
INTRAMUSCULAR | Status: AC
  Filled 2013-09-09: qty 1

## 2013-09-09 MED ORDER — ALBUTEROL SULFATE HFA 108 (90 BASE) MCG/ACT IN AERS: 1.0000 | INHALATION_SPRAY | Freq: Four times a day (QID) | RESPIRATORY_TRACT | Status: DC | PRN

## 2013-09-09 MED ORDER — AZITHROMYCIN 250 MG PO TABS: ORAL_TABLET | ORAL | Status: DC

## 2013-09-09 MED ORDER — PREDNISONE 20 MG PO TABS: 20.0000 mg | ORAL_TABLET | Freq: Two times a day (BID) | ORAL | Status: DC

## 2013-09-09 MED ORDER — METHYLPREDNISOLONE ACETATE 80 MG/ML IJ SUSP
80.0000 mg | Freq: Once | INTRAMUSCULAR | Status: AC
  Administered 2013-09-09: 80 mg via INTRAMUSCULAR

## 2013-09-09 NOTE — ED Provider Notes (Signed)
  Chief Complaint   Chief Complaint  Patient presents with  . URI    History of Present Illness   [name] is a 72 year old female who's had a 3 to four-day history of nasal congestion with yellow drainage, headache, sinus pressure, cough productive yellow sputum, wheezing, and nausea. She denies any fever or chills, sore throat, chest pain, vomiting, or diarrhea. She has had no known sick exposures.  Review of Systems   Other than as noted above, the patient denies any of the following symptoms: Systemic:  No fevers, chills, sweats, or myalgias. Eye:  No redness or discharge. ENT:  No ear pain, headache, nasal congestion, drainage, sinus pressure, or sore throat. Neck:  No neck pain, stiffness, or swollen glands. Lungs:  No cough, sputum production, hemoptysis, wheezing, chest tightness, shortness of breath or chest pain. GI:  No abdominal pain, nausea, vomiting or diarrhea.  PMFSH   Past medical history, family history, social history, meds, and allergies were reviewed. She takes a blood pressure medication for high blood pressure.  Physical exam   Vital signs:  BP 136/55  Pulse 71  Temp(Src) 99.9 F (37.7 C) (Oral)  Resp 16  SpO2 100% General:  Alert and oriented.  In no distress.  Skin warm and dry. Eye:  No conjunctival injection or drainage. Lids were normal. ENT:  TMs and canals were normal, without erythema or inflammation.  Nasal mucosa was clear and uncongested, without drainage.  Mucous membranes were moist.  Pharynx was clear with no exudate or drainage.  There were no oral ulcerations or lesions. Neck:  Supple, no adenopathy, tenderness or mass. Lungs:  No respiratory distress.  She has scattered wheezes bilaterally, no rales or rhonchi, good air movement.  Heart:  Regular rhythm, without gallops, murmers or rubs. Skin:  Clear, warm, and dry, without rash or lesions.  Course in Urgent Care Center   She was given Depo-Medrol 80 mg IM.  Assessment      The encounter diagnosis was Asthmatic bronchitis.  Plan    1.  Meds:  The following meds were prescribed:   Discharge Medication List as of 09/09/2013 11:39 AM    START taking these medications   Details  albuterol (PROVENTIL HFA;VENTOLIN HFA) 108 (90 BASE) MCG/ACT inhaler Inhale 1-2 puffs into the lungs every 6 (six) hours as needed for wheezing or shortness of breath., Starting 09/09/2013, Until Discontinued, Normal    azithromycin (ZITHROMAX Z-PAK) 250 MG tablet Take as directed., Normal    chlorpheniramine-HYDROcodone (TUSSIONEX) 10-8 MG/5ML LQCR Take 5 mLs by mouth every 12 (twelve) hours as needed for cough., Starting 09/09/2013, Until Discontinued, Normal    predniSONE (DELTASONE) 20 MG tablet Take 1 tablet (20 mg total) by mouth 2 (two) times daily., Starting 09/09/2013, Until Discontinued, Normal        2.  Patient Education/Counseling:  The patient was given appropriate handouts, self care instructions, and instructed in symptomatic relief.  Instructed to get extra fluids, rest, and use a cool mist vaporizer.   3.  Follow up:  The patient was told to follow up here if no better in 3 to 4 days, or sooner if becoming worse in any way, and given some red flag symptoms such as increasing fever, difficulty breathing, chest pain, or persistent vomiting which would prompt immediate return.  Follow up here as needed.      Reuben Likes, MD 09/09/13 [phone]

## 2013-09-09 NOTE — ED Notes (Signed)
C/o  Productive cough with yellow sputum.  Chest congestion with sob.  Nausea from mucus build up.  Denies fever/v/d.  Pt recently seen on 12/27 and is taking meds as prescribed but having no relief of mucus build up.  Pt has tried mucinex D with no relief.  Symptoms present since 12/27

## 2013-11-18 ENCOUNTER — Ambulatory Visit (INDEPENDENT_AMBULATORY_CARE_PROVIDER_SITE_OTHER): Payer: Medicare Other | Admitting: Internal Medicine

## 2013-11-18 ENCOUNTER — Encounter: Payer: Self-pay | Admitting: Internal Medicine

## 2013-11-18 ENCOUNTER — Telehealth: Payer: Self-pay | Admitting: Internal Medicine

## 2013-11-18 VITALS — BP 118/60 | HR 71 | Temp 97.6°F | Ht 65.0 in | Wt 147.0 lb

## 2013-11-18 DIAGNOSIS — R059 Cough, unspecified: Secondary | ICD-10-CM

## 2013-11-18 DIAGNOSIS — R05 Cough: Secondary | ICD-10-CM

## 2013-11-18 DIAGNOSIS — R058 Other specified cough: Secondary | ICD-10-CM

## 2013-11-18 MED ORDER — ESOMEPRAZOLE MAGNESIUM 40 MG PO CPDR: DELAYED_RELEASE_CAPSULE | ORAL | Status: DC

## 2013-11-18 NOTE — Progress Notes (Signed)
Subjective:     Patient ID: RITHA SAMPEDRO, female   DOB: [birth year]  MRN: [id number]  HPI  45 yowf never smoker told she had asthma as child and does have h/o seasonal rhinitis going back to childhood and dx of asthma but no daily symptoms (just intermittent 3-4x per year) until aorund 2009 eval by Annamaria Boots and dx as ashthma "better on symbicort / singulair" referred back to pulmonary 11/18/2013 by Dr Alroy Dust.   11/18/2013 1st [facility] office visit/ Audrielle Vankuren cc  Chief Complaint  Patient presents with  . Follow-up    Pt feels like she cant breathe unless she coughs and feels as if something is "stuck" in her throat. SOB only when coughing. Denies CP    Day > night, worse x one year    Kouffman Reflux v Neurogenic Cough Differentiator Reflux Comments  Do you awaken from a sound sleep coughing violently?                            With trouble breathing? occ   Do you have choking episodes when you cannot  Get enough air, gasping for air ?              yes   Do you usually cough when you lie down into  The bed, or when you just lie down to rest ?                          No problem sleep with head propped up   Do you usually cough after meals or eating?         no   Do you cough when (or after) you bend over?    no   GERD SCORE     Kouffman Reflux v Neurogenic Cough Differentiator Neurogenic   Do you more-or-less cough all day long? no   Does change of temperature make you cough? no   Does laughing or chuckling cause you to cough? no   Do fumes (perfume, automobile fumes, burned  Toast, etc.,) cause you to cough ?      yes   Does speaking, singing, or talking on the phone cause you to cough   ?               no   Neurogenic/Airway score      Prednisone did not help, inhalers not helping , gerd rx not helpful, still on singulair and no better  Very min yellow mucus  No obvious other patterns in day to day or daytime variabilty or assoc chronic cough or cp or chest tightness, subjective  wheeze overt sinus or hb symptoms. No unusual exp hx or h/o childhood pna/ asthma or knowledge of premature birth.  Sleeping ok without nocturnal  or early am exacerbation  of respiratory  c/o's or need for noct saba. Also denies any obvious fluctuation of symptoms with weather or environmental changes or other aggravating or alleviating factors except as outlined above   Current Medications, Allergies, Complete Past Medical History, Past Surgical History, Family History, and Social History were reviewed in [employer] record.  ROS  The following are not active complaints unless bolded sore throat, dysphagia, dental problems, itching, sneezing,  nasal congestion or excess/ purulent secretions, ear ache,   fever, chills, sweats, unintended wt loss, pleuritic or exertional cp, hemoptysis,  orthopnea pnd or leg swelling, presyncope, palpitations, heartburn, abdominal  pain, anorexia, nausea, vomiting, diarrhea  or change in bowel or urinary habits, change in stools or urine, dysuria,hematuria,  rash, arthralgias, visual complaints, headache, numbness weakness or ataxia or problems with walking or coordination,  change in mood/affect or memory.       Review of Systems     Objective:   Physical Exam    amb wf with freq throat clear   Wt Readings from Last 3 Encounters:  11/18/13 147 lb (66.679 kg)  09/19/08 166 lb 4 oz (75.411 kg)  03/19/08 169 lb 6.1 oz (76.831 kg)     HEENT: nl dentition, turbinates, and orophanx. Nl external ear canals without cough reflex   NECK :  without JVD/Nodes/TM/ nl carotid upstrokes bilaterally   LUNGS: no acc muscle use, clear to A and P bilaterally without cough on insp or exp maneuvers   CV:  RRR  no s3 or murmur or increase in P2, no edema   ABD:  soft and nontender with nl excursion in the supine position. No bruits or organomegaly, bowel sounds nl  MS:  warm without deformities, calf tenderness, cyanosis or clubbing  SKIN: warm  and dry without lesions    NEURO:  alert, approp, no deficits        Assessment:

## 2013-11-18 NOTE — Patient Instructions (Addendum)
Nexium 40 mg Take 30-60 min before first meal of the day and Pepcid ac 20 mg and chlortrimeton 4 mg at bedtime until you return to see if can lie flat  Continue singulair  For throat tickle can also use the chlortrimeton 4 mg every 4 hours but may make you drowsy   GERD (REFLUX)  is an extremely common cause of respiratory symptoms, many times with no significant heartburn at all.    It can be treated with medication, but also with lifestyle changes including avoidance of late meals, excessive alcohol, smoking cessation, and avoid fatty foods, chocolate, peppermint, colas, red wine, and acidic juices such as orange juice.  NO MINT OR MENTHOL PRODUCTS SO NO COUGH DROPS  USE SUGARLESS CANDY INSTEAD (jolley ranchers or Stover's)  NO OIL BASED VITAMINS - use powdered substitutes.    See Tammy NP w/in 2 weeks with all your medications, even over the counter meds, separated in two separate bags, the ones you take no matter what vs the ones you stop once you feel better and take only as needed when you feel you need them.   Tammy  will generate for you a new user friendly medication calendar that will put [place] all on the same page re: your medication use.     Without this process, it simply isn't possible to assure that we are providing  your outpatient care  with  the attention to detail we feel you deserve.   If we cannot assure that you're getting that kind of care,  then we cannot manage your problem effectively from this clinic.  Once you have seen Tammy and we are sure that we're all on the same page with your medication use she will arrange follow up with me.  Late add needs a methacholine challnege and intiate trial of neutontin 100 tid until ov  Also needs updated cxr

## 2013-11-18 NOTE — Telephone Encounter (Signed)
Per patient instructions from today's visit 3.9.15 w/ MW: Patient Instructions     Nexium 40 mg Take 30-60 min before first meal of the day and Pepcid ac 20 mg and chlortrimeton 4 mg at bedtime until you return to see if can lie flat  Continue singulair  For throat tickle can also use the chlortrimeton 4 mg every 4 hours but may make you drowsy  GERD (REFLUX) is an extremely common cause of respiratory symptoms, many times with no significant heartburn at all.  It can be treated with medication, but also with lifestyle changes including avoidance of late meals, excessive alcohol, smoking cessation, and avoid fatty foods, chocolate, peppermint, colas, red wine, and acidic juices such as orange juice.  NO MINT OR MENTHOL PRODUCTS SO NO COUGH DROPS  USE SUGARLESS CANDY INSTEAD (jolley ranchers or Stover's)  NO OIL BASED VITAMINS - use powdered substitutes.  See Tammy NP w/in 2 weeks with all your medications, even over the counter meds, separated in two separate bags, the ones you take no matter what vs the ones you stop once you feel better and take only as needed when you feel you need them. Tammy will generate for you a new user friendly medication calendar that will put [place] all on the same page re: your medication use.  Without this process, it simply isn't possible to assure that we are providing your outpatient care with the attention to detail we feel you deserve. If we cannot assure that you're getting that kind of care, then we cannot manage your problem effectively from this clinic.  Once you have seen Tammy and we are sure that we're all on the same page with your medication use she will arrange follow up with me.  Late add needs a methacholine challnege and intiate trial of neutontin 100 tid until ov    Called spoke with patient and apologized for any inconvenience.  Also advised pt that the pepcid and chlortrimeton are both otc but will be happy to go ahead and call in the nexium.  Pt okay  with this and verbalized her understanding.  Pt denied any further questions or concerns.  Nothing further needed.  Called [facility] and spoke with pharmacist Tammy.  Rx for Nexium 40mg  30-60 mins before first meal of the day #30 with 5 refills given verbally.  Med list updated.  Will sign off.

## 2013-11-20 NOTE — Assessment & Plan Note (Signed)
The most common causes of chronic cough in immunocompetent adults include the following: upper airway cough syndrome (UACS), previously referred to as postnasal drip syndrome (PNDS), which is caused by variety of rhinosinus conditions; (2) asthma; (3) GERD; (4) chronic bronchitis from cigarette smoking or other inhaled environmental irritants; (5) nonasthmatic eosinophilic bronchitis; and (6) bronchiectasis.   These conditions, singly or in combination, have accounted for up to 94% of the causes of chronic cough in prospective studies.   Other conditions have constituted no >6% of the causes in prospective studies These have included bronchogenic carcinoma, chronic interstitial pneumonia, sarcoidosis, left ventricular failure, ACEI-induced cough, and aspiration from a condition associated with pharyngeal dysfunction.    Chronic cough is often simultaneously caused by more than one condition. A single cause has been found from 38 to 82% of the time, multiple causes from 18 to 62%. Multiply caused cough has been the result of three diseases up to 42% of the time.       Absence of response to prednisone against cough variant asthma and strongly favors  Classic Upper airway cough syndrome, so named because it's frequently impossible to sort out how much is  CR/sinusitis with freq throat clearing (which can be related to primary GERD)   vs  causing  secondary (" extra esophageal")  GERD from wide swings in gastric pressure that occur with throat clearing, often  promoting self use of mint and menthol lozenges that reduce the lower esophageal sphincter tone and exacerbate the problem further in a cyclical fashion.   These are the same pts (now being labeled as having "irritable larynx syndrome" by some cough centers) who not infrequently have a history of having failed to tolerate ace inhibitors,  dry powder inhalers or biphosphonates or report having atypical reflux symptoms that don't respond to standard  doses of PPI , and are easily confused as having aecopd or asthma flares by even experienced allergists/ pulmonologists.   For now rx for gerd, pnds and return to regroup with full med reconciliation/ cxr/  Next step is add neurontin/ complete a MCT to be sure re dx of asthma or not.

## 2013-12-02 ENCOUNTER — Encounter: Payer: Medicare Other | Admitting: Adult Health

## 2013-12-08 DIAGNOSIS — T1500XA Foreign body in cornea, unspecified eye, initial encounter: Secondary | ICD-10-CM | POA: Diagnosis not present

## 2013-12-11 DIAGNOSIS — J45909 Unspecified asthma, uncomplicated: Secondary | ICD-10-CM | POA: Diagnosis not present

## 2013-12-11 DIAGNOSIS — R6889 Other general symptoms and signs: Secondary | ICD-10-CM | POA: Diagnosis not present

## 2013-12-11 DIAGNOSIS — K219 Gastro-esophageal reflux disease without esophagitis: Secondary | ICD-10-CM | POA: Diagnosis not present

## 2014-01-02 DIAGNOSIS — H02839 Dermatochalasis of unspecified eye, unspecified eyelid: Secondary | ICD-10-CM | POA: Diagnosis not present

## 2014-01-02 DIAGNOSIS — H04129 Dry eye syndrome of unspecified lacrimal gland: Secondary | ICD-10-CM | POA: Diagnosis not present

## 2014-01-16 DIAGNOSIS — H10029 Other mucopurulent conjunctivitis, unspecified eye: Secondary | ICD-10-CM | POA: Diagnosis not present

## 2014-01-27 DIAGNOSIS — M79609 Pain in unspecified limb: Secondary | ICD-10-CM | POA: Diagnosis not present

## 2014-03-02 ENCOUNTER — Emergency Department (HOSPITAL_COMMUNITY): Payer: Medicare Other

## 2014-03-02 ENCOUNTER — Inpatient Hospital Stay (HOSPITAL_COMMUNITY)
Admission: EM | Admit: 2014-03-02 | Discharge: 2014-03-04 | DRG: 391 | Disposition: A | Discharge status: Home / Self Care | Payer: Medicare Other | Attending: Internal Medicine | Admitting: Internal Medicine

## 2014-03-02 ENCOUNTER — Encounter (HOSPITAL_COMMUNITY): Payer: Self-pay | Admitting: Emergency Medicine

## 2014-03-02 DIAGNOSIS — Z79899 Other long term (current) drug therapy: Secondary | ICD-10-CM

## 2014-03-02 DIAGNOSIS — K279 Peptic ulcer, site unspecified, unspecified as acute or chronic, without hemorrhage or perforation: Secondary | ICD-10-CM

## 2014-03-02 DIAGNOSIS — K219 Gastro-esophageal reflux disease without esophagitis: Secondary | ICD-10-CM

## 2014-03-02 DIAGNOSIS — K449 Diaphragmatic hernia without obstruction or gangrene: Secondary | ICD-10-CM | POA: Diagnosis not present

## 2014-03-02 DIAGNOSIS — R933 Abnormal findings on diagnostic imaging of other parts of digestive tract: Secondary | ICD-10-CM | POA: Diagnosis not present

## 2014-03-02 DIAGNOSIS — R058 Other specified cough: Secondary | ICD-10-CM

## 2014-03-02 DIAGNOSIS — K2970 Gastritis, unspecified, without bleeding: Secondary | ICD-10-CM | POA: Diagnosis not present

## 2014-03-02 DIAGNOSIS — I152 Hypertension secondary to endocrine disorders: Secondary | ICD-10-CM | POA: Diagnosis present

## 2014-03-02 DIAGNOSIS — I351 Nonrheumatic aortic (valve) insufficiency: Secondary | ICD-10-CM

## 2014-03-02 DIAGNOSIS — K2100 Gastro-esophageal reflux disease with esophagitis, without bleeding: Secondary | ICD-10-CM

## 2014-03-02 DIAGNOSIS — R109 Unspecified abdominal pain: Secondary | ICD-10-CM

## 2014-03-02 DIAGNOSIS — Z9010 Acquired absence of unspecified breast and nipple: Secondary | ICD-10-CM | POA: Diagnosis not present

## 2014-03-02 DIAGNOSIS — Z791 Long term (current) use of non-steroidal anti-inflammatories (NSAID): Secondary | ICD-10-CM

## 2014-03-02 DIAGNOSIS — K859 Acute pancreatitis without necrosis or infection, unspecified: Secondary | ICD-10-CM | POA: Diagnosis present

## 2014-03-02 DIAGNOSIS — Z8249 Family history of ischemic heart disease and other diseases of the circulatory system: Secondary | ICD-10-CM

## 2014-03-02 DIAGNOSIS — Z9089 Acquired absence of other organs: Secondary | ICD-10-CM | POA: Diagnosis not present

## 2014-03-02 DIAGNOSIS — K2900 Acute gastritis without bleeding: Principal | ICD-10-CM | POA: Diagnosis present

## 2014-03-02 DIAGNOSIS — I10 Essential (primary) hypertension: Secondary | ICD-10-CM

## 2014-03-02 DIAGNOSIS — R05 Cough: Secondary | ICD-10-CM

## 2014-03-02 DIAGNOSIS — R112 Nausea with vomiting, unspecified: Secondary | ICD-10-CM | POA: Diagnosis present

## 2014-03-02 DIAGNOSIS — K2990 Gastroduodenitis, unspecified, without bleeding: Secondary | ICD-10-CM | POA: Diagnosis not present

## 2014-03-02 DIAGNOSIS — R001 Bradycardia, unspecified: Secondary | ICD-10-CM

## 2014-03-02 DIAGNOSIS — K858 Other acute pancreatitis without necrosis or infection: Secondary | ICD-10-CM

## 2014-03-02 DIAGNOSIS — K269 Duodenal ulcer, unspecified as acute or chronic, without hemorrhage or perforation: Secondary | ICD-10-CM | POA: Diagnosis not present

## 2014-03-02 DIAGNOSIS — E1159 Type 2 diabetes mellitus with other circulatory complications: Secondary | ICD-10-CM | POA: Diagnosis present

## 2014-03-02 DIAGNOSIS — J45909 Unspecified asthma, uncomplicated: Secondary | ICD-10-CM | POA: Diagnosis not present

## 2014-03-02 DIAGNOSIS — R1011 Right upper quadrant pain: Secondary | ICD-10-CM | POA: Diagnosis not present

## 2014-03-02 DIAGNOSIS — Z87898 Personal history of other specified conditions: Secondary | ICD-10-CM

## 2014-03-02 DIAGNOSIS — R1013 Epigastric pain: Secondary | ICD-10-CM | POA: Diagnosis not present

## 2014-03-02 DIAGNOSIS — R091 Pleurisy: Secondary | ICD-10-CM | POA: Diagnosis not present

## 2014-03-02 DIAGNOSIS — C50919 Malignant neoplasm of unspecified site of unspecified female breast: Secondary | ICD-10-CM

## 2014-03-02 HISTORY — DX: Reserved for inherently not codable concepts without codable children: IMO0001

## 2014-03-02 HISTORY — DX: Unspecified asthma, uncomplicated: J45.909

## 2014-03-02 HISTORY — DX: Other specified postprocedural states: Z98.890

## 2014-03-02 HISTORY — DX: Other cervical disc degeneration, unspecified cervical region: M50.30

## 2014-03-02 HISTORY — DX: Other specified postprocedural states: R11.2

## 2014-03-02 LAB — CBC WITH DIFFERENTIAL/PLATELET
Basophils Absolute: 0 10*3/uL (ref 0.0–0.1)
Basophils Relative: 0 % (ref 0–1)
Eosinophils Absolute: 0.2 10*3/uL (ref 0.0–0.7)
Eosinophils Relative: 2 % (ref 0–5)
HEMATOCRIT: 36.3 % (ref 36.0–46.0)
HEMOGLOBIN: 12.7 g/dL (ref 12.0–15.0)
LYMPHS ABS: 1.6 10*3/uL (ref 0.7–4.0)
Lymphocytes Relative: 17 % (ref 12–46)
MCH: 30.3 pg (ref 26.0–34.0)
MCHC: 35 g/dL (ref 30.0–36.0)
MCV: 86.6 fL (ref 78.0–100.0)
MONO ABS: 0.7 10*3/uL (ref 0.1–1.0)
MONOS PCT: 8 % (ref 3–12)
NEUTROS ABS: 7 10*3/uL (ref 1.7–7.7)
NEUTROS PCT: 73 % (ref 43–77)
Platelets: 183 10*3/uL (ref 150–400)
RBC: 4.19 MIL/uL (ref 3.87–5.11)
RDW: 13.1 % (ref 11.5–15.5)
WBC: 9.5 10*3/uL (ref 4.0–10.5)

## 2014-03-02 LAB — COMPREHENSIVE METABOLIC PANEL
ALK PHOS: 84 U/L (ref 39–117)
ALT: 13 U/L (ref 0–35)
AST: 17 U/L (ref 0–37)
Albumin: 3.9 g/dL (ref 3.5–5.2)
BUN: 15 mg/dL (ref 6–23)
CHLORIDE: 102 meq/L (ref 96–112)
CO2: 24 meq/L (ref 19–32)
CREATININE: 0.74 mg/dL (ref 0.50–1.10)
Calcium: 9.6 mg/dL (ref 8.4–10.5)
GFR, EST NON AFRICAN AMERICAN: 82 mL/min — AB (ref >90)
GLUCOSE: 130 mg/dL — AB (ref 70–99)
Potassium: 4.2 mEq/L (ref 3.7–5.3)
Sodium: 142 mEq/L (ref 137–147)
Total Bilirubin: 0.4 mg/dL (ref 0.3–1.2)
Total Protein: 7.3 g/dL (ref 6.0–8.3)

## 2014-03-02 LAB — I-STAT TROPONIN, ED: Troponin i, poc: 0 ng/mL (ref 0.00–0.08)

## 2014-03-02 LAB — LACTIC ACID, PLASMA: Lactic Acid, Venous: 1.4 mmol/L (ref 0.5–2.2)

## 2014-03-02 LAB — TROPONIN I: Troponin I: <0.3 ng/mL (ref <0.30)

## 2014-03-02 LAB — LIPASE, BLOOD: LIPASE: 46 U/L (ref 11–59)

## 2014-03-02 MED ORDER — IOHEXOL 300 MG/ML  SOLN
20.0000 mL | INTRAMUSCULAR | Status: DC
  Administered 2014-03-02: 20 mL via ORAL

## 2014-03-02 MED ORDER — FAMOTIDINE IN NACL 20-0.9 MG/50ML-% IV SOLN
20.0000 mg | Freq: Once | INTRAVENOUS | Status: AC
  Administered 2014-03-02: 20 mg via INTRAVENOUS
  Filled 2014-03-02: qty 50

## 2014-03-02 MED ORDER — MORPHINE SULFATE 4 MG/ML IJ SOLN
4.0000 mg | INTRAMUSCULAR | Status: AC | PRN
  Administered 2014-03-02 – 2014-03-03 (×2): 4 mg via INTRAVENOUS
  Filled 2014-03-02 (×2): qty 1

## 2014-03-02 MED ORDER — ONDANSETRON HCL 4 MG/2ML IJ SOLN
4.0000 mg | INTRAMUSCULAR | Status: AC | PRN
  Administered 2014-03-02 – 2014-03-03 (×2): 4 mg via INTRAVENOUS
  Filled 2014-03-02 (×2): qty 2

## 2014-03-02 MED ORDER — SODIUM CHLORIDE 0.9 % IV SOLN
INTRAVENOUS | Status: DC
  Administered 2014-03-02: 23:00:00 via INTRAVENOUS

## 2014-03-02 MED ORDER — ASPIRIN 81 MG PO CHEW
324.0000 mg | CHEWABLE_TABLET | Freq: Once | ORAL | Status: AC
  Administered 2014-03-02: 324 mg via ORAL
  Filled 2014-03-02: qty 4

## 2014-03-02 NOTE — ED Notes (Addendum)
Patient here with chest pain, shortness of breath, and weakness. Started Friday 02/28/2014. States that it began as pain in her back and progressed to lower mid chest. Endorses no mitigating or agravating factors. No history of similar pain. Significant family history of cardiac problems.

## 2014-03-02 NOTE — ED Provider Notes (Signed)
CSN: [id number]     Arrival date & time 03/02/14  2224 History   First MD Initiated Contact with Patient 03/02/14 2301     Chief Complaint  Patient presents with  . Abdominal Pain      HPI Pt was seen at 2300. Per pt and her spouse, c/o gradual onset and worsening of persistent upper abd "pain" for the past 2 days. Describes the pain as "aching," and radiating around to her back. States she took OTC Tums with minimal relief. States her pain worsened tonight approximately 2 hours after eating. Denies N/V/D, no back pain, no CP/palpitations, no SOB/cough, no fevers, no rash.    Past Medical History  Diagnosis Date  . Mild aortic insufficiency 03/28/2006    echo  . H/O chest pain 03/24/2006    normal nuclear  study  . Sinus bradycardia on ECG   . Cancer     right breast mastectomy  . DDD (degenerative disc disease), cervical   . Normal cardiac stress test 2005   Past Surgical History  Procedure Laterality Date  . Mastectomy  1993    right breast  . Other surgical history      hysterectomy  . Rectal fissure    . Childbirth      x 2  . Tubal ligation    . Appendectomy     Family History  Problem Relation Age of Onset  . Heart attack Mother   . Heart disease Mother   . Hypertension Mother   . Heart attack Father   . Heart disease Father   . Hypertension Sister   . Heart attack Sister   . Hypertension Sister   . Heart attack Sister    History  Substance Use Topics  . Smoking status: Never Smoker   . Smokeless tobacco: Not on file  . Alcohol Use: No    Review of Systems ROS: Statement: All systems negative except as marked or noted in the HPI; Constitutional: Negative for fever and chills. ; ; Eyes: Negative for eye pain, redness and discharge. ; ; ENMT: Negative for ear pain, hoarseness, nasal congestion, sinus pressure and sore throat. ; ; Cardiovascular: Negative for chest pain, palpitations, diaphoresis, dyspnea and peripheral edema. ; ; Respiratory: Negative for  cough, wheezing and stridor. ; ; Gastrointestinal: +abd pain. Negative for nausea, vomiting, diarrhea, blood in stool, hematemesis, jaundice and rectal bleeding. . ; ; Genitourinary: Negative for dysuria, flank pain and hematuria. ; ; Musculoskeletal: Negative for back pain and neck pain. Negative for swelling and trauma.; ; Skin: Negative for pruritus, rash, abrasions, blisters, bruising and skin lesion.; ; Neuro: Negative for headache, lightheadedness and neck stiffness. Negative for weakness, altered level of consciousness , altered mental status, extremity weakness, paresthesias, involuntary movement, seizure and syncope.      Allergies  Doxycycline and Percocet  Home Medications   Prior to Admission medications   Medication Sig Start Date End Date Taking? Authorizing Provider  amLODipine (NORVASC) 5 MG tablet Take 5 mg by mouth daily.   Yes Historical Provider, MD  famotidine (PEPCID) 10 MG tablet Take 10 mg by mouth daily.   Yes Historical Provider, MD  Fluticasone-Salmeterol (ADVAIR) 250-50 MCG/DOSE AEPB Inhale 1 puff into the lungs daily as needed (for cough).   Yes Historical Provider, MD  loratadine (CLARITIN) 10 MG tablet Take 10 mg by mouth daily.   Yes Historical Provider, MD  montelukast (SINGULAIR) 10 MG tablet Take 10 mg by mouth at bedtime.   Yes Historical  Provider, MD  OVER THE COUNTER MEDICATION Take 1 tablet by mouth at bedtime. OTC sinus medication   Yes Historical Provider, MD  albuterol (PROAIR HFA) 108 (90 BASE) MCG/ACT inhaler Inhale 2 puffs into the lungs every 6 (six) hours as needed for wheezing or shortness of breath.    Historical Provider, MD   BP 172/60  Pulse 72  Temp(Src) 98.4 F (36.9 C) (Oral)  Resp 18  Ht 5\' 5"  (1.651 m)  Wt 146 lb (66.225 kg)  BMI 24.30 kg/m2  SpO2 98% Physical Exam 2305: Physical examination:  Nursing notes reviewed; Vital signs and O2 SAT reviewed;  Constitutional: Well developed, Well nourished, Well hydrated, Uncomfortable  appearing.; Head:  Normocephalic, atraumatic; Eyes: EOMI, PERRL, No scleral icterus; ENMT: Mouth and pharynx normal, Mucous membranes moist; Neck: Supple, Full range of motion, No lymphadenopathy; Cardiovascular: Regular rate and rhythm, No gallop; Respiratory: Breath sounds clear & equal bilaterally, No wheezes.  Speaking full sentences with ease, Normal respiratory effort/excursion; Chest: Nontender, Movement normal; Abdomen: Soft, +RUQ and mid-epigastric areas tender to palp. Nondistended, Normal bowel sounds; Genitourinary: No CVA tenderness; Extremities: Pulses normal, No tenderness, No edema, No calf edema or asymmetry.; Neuro: AA&Ox3, Major CN grossly intact.  Speech clear. No gross focal motor or sensory deficits in extremities. Climbs on and off stretcher easily by herself. Gait steady.; Skin: Color normal, Warm, Dry.    ED Course  Procedures     EKG Interpretation   Date/Time:  Sunday March 02 2014 22:34:01 EDT Ventricular Rate:  66 PR Interval:  170 QRS Duration: 80 QT Interval:  384 QTC Calculation: 402 R Axis:   -4 Text Interpretation:  Normal sinus rhythm Artifact Moderate voltage  criteria for LVH, may be normal variant Possible Anterior infarct , age  undetermined Abnormal ECG When compared with ECG of 12/25/2003 Poor data  quality Confirmed by [facility]  MD, Nunzio Cory [id number]) on 03/02/2014 11:16:07  PM     EKG Interpretation  Date/Time:  Sunday March 02 2014 23:23:52 EDT Ventricular Rate:  63 PR Interval:  168 QRS Duration: 74 QT Interval:  425 QTC Calculation: 435 R Axis:   4 Text Interpretation:  Sinus rhythm LVH by voltage Anteroseptal infarct, old When compared with ECG of 12/25/2003 No significant change was found Confirmed by [facility]  MD, Nunzio Cory [id number]) on 03/02/2014 11:29:55 PM         MDM  MDM Reviewed: previous chart, nursing note and vitals Reviewed previous: labs and ECG Interpretation: labs, ECG, x-ray and CT scan    Results for orders placed  during the hospital encounter of 03/02/14  URINALYSIS, ROUTINE W REFLEX MICROSCOPIC      Result Value Ref Range   Color, Urine YELLOW  YELLOW   APPearance CLEAR  CLEAR   Specific Gravity, Urine 1.010  1.005 - 1.030   pH 7.5  5.0 - 8.0   Glucose, UA NEGATIVE  NEGATIVE mg/dL   Hgb urine dipstick NEGATIVE  NEGATIVE   Bilirubin Urine NEGATIVE  NEGATIVE   Ketones, ur NEGATIVE  NEGATIVE mg/dL   Protein, ur NEGATIVE  NEGATIVE mg/dL   Urobilinogen, UA 0.2  0.0 - 1.0 mg/dL   Nitrite NEGATIVE  NEGATIVE   Leukocytes, UA MODERATE (*) NEGATIVE  CBC WITH DIFFERENTIAL      Result Value Ref Range   WBC 9.5  4.0 - 10.5 K/uL   RBC 4.19  3.87 - 5.11 MIL/uL   Hemoglobin 12.7  12.0 - 15.0 g/dL   HCT 36.3  36.0 - 46.0 %  MCV 86.6  78.0 - 100.0 fL   MCH 30.3  26.0 - 34.0 pg   MCHC 35.0  30.0 - 36.0 g/dL   RDW 13.1  11.5 - 15.5 %   Platelets 183  150 - 400 K/uL   Neutrophils Relative % 73  43 - 77 %   Neutro Abs 7.0  1.7 - 7.7 K/uL   Lymphocytes Relative 17  12 - 46 %   Lymphs Abs 1.6  0.7 - 4.0 K/uL   Monocytes Relative 8  3 - 12 %   Monocytes Absolute 0.7  0.1 - 1.0 K/uL   Eosinophils Relative 2  0 - 5 %   Eosinophils Absolute 0.2  0.0 - 0.7 K/uL   Basophils Relative 0  0 - 1 %   Basophils Absolute 0.0  0.0 - 0.1 K/uL  COMPREHENSIVE METABOLIC PANEL      Result Value Ref Range   Sodium 142  137 - 147 mEq/L   Potassium 4.2  3.7 - 5.3 mEq/L   Chloride 102  96 - 112 mEq/L   CO2 24  19 - 32 mEq/L   Glucose, Bld 130 (*) 70 - 99 mg/dL   BUN 15  6 - 23 mg/dL   Creatinine, Ser 0.74  0.50 - 1.10 mg/dL   Calcium 9.6  8.4 - 10.5 mg/dL   Total Protein 7.3  6.0 - 8.3 g/dL   Albumin 3.9  3.5 - 5.2 g/dL   AST 17  0 - 37 U/L   ALT 13  0 - 35 U/L   Alkaline Phosphatase 84  39 - 117 U/L   Total Bilirubin 0.4  0.3 - 1.2 mg/dL   GFR calc non Af Amer 82 (*) >90 mL/min   GFR calc Af Amer >90  >90 mL/min  LIPASE, BLOOD      Result Value Ref Range   Lipase 46  11 - 59 U/L  LACTIC ACID, PLASMA      Result  Value Ref Range   Lactic Acid, Venous 1.4  0.5 - 2.2 mmol/L  TROPONIN I      Result Value Ref Range   Troponin I <0.30  <0.30 ng/mL  URINE MICROSCOPIC-ADD ON      Result Value Ref Range   WBC, UA 3-6  <3 WBC/hpf   RBC / HPF 0-2  <3 RBC/hpf   Bacteria, UA RARE  RARE  I-STAT TROPOININ, ED      Result Value Ref Range   Troponin i, poc 0.00  0.00 - 0.08 ng/mL   Comment 3            Dg Chest 2 View 03/03/2014   CLINICAL DATA:  Epigastric pain and shortness of breath for 3 days.  EXAM: CHEST  2 VIEW  COMPARISON:  12/20/2012  FINDINGS: Bilateral apical pleural thickening. The heart size and mediastinal contours are within normal limits. Both lungs are clear. The visualized skeletal structures are unremarkable. Postoperative changes with right mastectomy and surgical clips in the right axilla. Degenerative changes in the spine and shoulders. Surgical clips in the right upper quadrant.  IMPRESSION: No active cardiopulmonary disease.   Electronically Signed   By: Lucienne Capers M.D.   On: 03/03/2014 00:17   Ct Abdomen Pelvis W Contrast 03/03/2014   CLINICAL DATA:  Upper abdominal and epigastric pain. History of breast cancer.  EXAM: CT ABDOMEN AND PELVIS WITH CONTRAST  TECHNIQUE: Multidetector CT imaging of the abdomen and pelvis was performed using the standard protocol  following bolus administration of intravenous contrast.  CONTRAST:  159mL OMNIPAQUE IOHEXOL 300 MG/ML  SOLN  COMPARISON:  None.  FINDINGS: Atelectasis in the lung bases.  Right breast prosthesis.  Surgical absence of the gallbladder. There is infiltration or edema around the head of the pancreas and extending into the right anterior perirenal fascia and right lateral colonic gutter. Infiltration or edema extends around the duodenum bulb. There is mild intra and extrahepatic bile duct dilatation. Changes likely represent focal pancreatitis in the head of the pancreas possibly related to penetrating ulcer disease in the adjacent duodenum. No  loculated fluid collections. Homogeneous enhancement of the pancreatic parenchyma. No significant pancreatic ductal dilatation.  No focal liver lesions. The spleen, adrenal glands, kidneys, abdominal aorta, inferior vena cava, and retroperitoneal lymph nodes are unremarkable. The stomach, small bowel, and colon are not abnormally distended. No free air in the abdomen.  Pelvis: Appendix is surgically absent. No diverticulitis. Bladder wall is not thickened. Small amount of free fluid in the pelvis is likely related to the upper abdominal inflammatory process. No pelvic mass or lymphadenopathy. No destructive bone lesions.  IMPRESSION: Inflammatory process involving the duodenum and head of pancreas likely representing focal acute pancreatitis and possibly associated with duodenal peptic ulcer disease.   Electronically Signed   By: Lucienne Capers M.D.   On: 03/03/2014 01:13    0150:  Acute pancreatitis on CT scan. Dx and testing d/w pt and family.  Questions answered.  Verb understanding, agreeable to admit.  T/C to [facility]. Hal Hope, case discussed, including:  HPI, pertinent PM/SHx, VS/PE, dx testing, ED course and treatment:  Agreeable to admit, requests to write temporary orders, obtain medical bed to team 10.   Alfonzo Feller, DO 03/04/14 1818

## 2014-03-03 ENCOUNTER — Inpatient Hospital Stay (HOSPITAL_COMMUNITY): Payer: Medicare Other

## 2014-03-03 ENCOUNTER — Emergency Department (HOSPITAL_COMMUNITY): Payer: Medicare Other

## 2014-03-03 ENCOUNTER — Encounter (HOSPITAL_COMMUNITY): Payer: Self-pay

## 2014-03-03 DIAGNOSIS — R112 Nausea with vomiting, unspecified: Secondary | ICD-10-CM | POA: Diagnosis present

## 2014-03-03 DIAGNOSIS — K859 Acute pancreatitis without necrosis or infection, unspecified: Secondary | ICD-10-CM | POA: Diagnosis present

## 2014-03-03 DIAGNOSIS — K219 Gastro-esophageal reflux disease without esophagitis: Secondary | ICD-10-CM

## 2014-03-03 DIAGNOSIS — I10 Essential (primary) hypertension: Secondary | ICD-10-CM | POA: Insufficient documentation

## 2014-03-03 DIAGNOSIS — E1159 Type 2 diabetes mellitus with other circulatory complications: Secondary | ICD-10-CM | POA: Insufficient documentation

## 2014-03-03 DIAGNOSIS — R109 Unspecified abdominal pain: Secondary | ICD-10-CM | POA: Diagnosis present

## 2014-03-03 DIAGNOSIS — K279 Peptic ulcer, site unspecified, unspecified as acute or chronic, without hemorrhage or perforation: Secondary | ICD-10-CM | POA: Diagnosis present

## 2014-03-03 LAB — URINALYSIS, ROUTINE W REFLEX MICROSCOPIC
Bilirubin Urine: NEGATIVE
GLUCOSE, UA: NEGATIVE mg/dL
Hgb urine dipstick: NEGATIVE
KETONES UR: NEGATIVE mg/dL
NITRITE: NEGATIVE
PH: 7.5 (ref 5.0–8.0)
Protein, ur: NEGATIVE mg/dL
SPECIFIC GRAVITY, URINE: 1.01 (ref 1.005–1.030)
Urobilinogen, UA: 0.2 mg/dL (ref 0.0–1.0)

## 2014-03-03 LAB — GLUCOSE, CAPILLARY
GLUCOSE-CAPILLARY: 134 mg/dL — AB (ref 70–99)
GLUCOSE-CAPILLARY: 104 mg/dL — AB (ref 70–99)
Glucose-Capillary: 101 mg/dL — ABNORMAL HIGH (ref 70–99)
Glucose-Capillary: 122 mg/dL — ABNORMAL HIGH (ref 70–99)

## 2014-03-03 LAB — COMPREHENSIVE METABOLIC PANEL
ALBUMIN: 3.5 g/dL (ref 3.5–5.2)
ALK PHOS: 77 U/L (ref 39–117)
ALT: 12 U/L (ref 0–35)
AST: 14 U/L (ref 0–37)
BUN: 10 mg/dL (ref 6–23)
CHLORIDE: 103 meq/L (ref 96–112)
CO2: 24 meq/L (ref 19–32)
Calcium: 8.5 mg/dL (ref 8.4–10.5)
Creatinine, Ser: 0.69 mg/dL (ref 0.50–1.10)
GFR calc Af Amer: >90 mL/min (ref >90)
GFR, EST NON AFRICAN AMERICAN: 84 mL/min — AB (ref >90)
Glucose, Bld: 162 mg/dL — ABNORMAL HIGH (ref 70–99)
POTASSIUM: 3.8 meq/L (ref 3.7–5.3)
Sodium: 138 mEq/L (ref 137–147)
Total Bilirubin: 0.4 mg/dL (ref 0.3–1.2)
Total Protein: 6.4 g/dL (ref 6.0–8.3)

## 2014-03-03 LAB — CBC WITH DIFFERENTIAL/PLATELET
BASOS PCT: 0 % (ref 0–1)
Basophils Absolute: 0 10*3/uL (ref 0.0–0.1)
EOS PCT: 1 % (ref 0–5)
Eosinophils Absolute: 0.1 10*3/uL (ref 0.0–0.7)
HCT: 34.8 % — ABNORMAL LOW (ref 36.0–46.0)
Hemoglobin: 11.8 g/dL — ABNORMAL LOW (ref 12.0–15.0)
Lymphocytes Relative: 10 % — ABNORMAL LOW (ref 12–46)
Lymphs Abs: 0.8 10*3/uL (ref 0.7–4.0)
MCH: 29.4 pg (ref 26.0–34.0)
MCHC: 33.9 g/dL (ref 30.0–36.0)
MCV: 86.8 fL (ref 78.0–100.0)
Monocytes Absolute: 0.4 10*3/uL (ref 0.1–1.0)
Monocytes Relative: 5 % (ref 3–12)
NEUTROS PCT: 84 % — AB (ref 43–77)
Neutro Abs: 7 10*3/uL (ref 1.7–7.7)
PLATELETS: 160 10*3/uL (ref 150–400)
RBC: 4.01 MIL/uL (ref 3.87–5.11)
RDW: 13.2 % (ref 11.5–15.5)
WBC: 8.3 10*3/uL (ref 4.0–10.5)

## 2014-03-03 LAB — LIPID PANEL
CHOLESTEROL: 169 mg/dL (ref 0–200)
HDL: 47 mg/dL (ref >39)
LDL Cholesterol: 102 mg/dL — ABNORMAL HIGH (ref 0–99)
Total CHOL/HDL Ratio: 3.6 RATIO
Triglycerides: 99 mg/dL (ref <150)
VLDL: 20 mg/dL (ref 0–40)

## 2014-03-03 LAB — URINE MICROSCOPIC-ADD ON

## 2014-03-03 LAB — AMYLASE: Amylase: 39 U/L (ref 0–105)

## 2014-03-03 LAB — TROPONIN I: Troponin I: <0.3 ng/mL (ref <0.30)

## 2014-03-03 LAB — LIPASE, BLOOD: LIPASE: 25 U/L (ref 11–59)

## 2014-03-03 MED ORDER — IOHEXOL 300 MG/ML  SOLN
100.0000 mL | Freq: Once | INTRAMUSCULAR | Status: AC | PRN
  Administered 2014-03-03: 100 mL via INTRAVENOUS

## 2014-03-03 MED ORDER — PROMETHAZINE HCL 25 MG/ML IJ SOLN: 12.5000 mg | Freq: Four times a day (QID) | INTRAMUSCULAR | Status: DC | PRN

## 2014-03-03 MED ORDER — SODIUM CHLORIDE 0.9 % IV SOLN
INTRAVENOUS | Status: DC
  Administered 2014-03-03: 75 mL/h via INTRAVENOUS

## 2014-03-03 MED ORDER — ONDANSETRON HCL 4 MG PO TABS: 4.0000 mg | ORAL_TABLET | Freq: Four times a day (QID) | ORAL | Status: DC | PRN

## 2014-03-03 MED ORDER — ENOXAPARIN SODIUM 40 MG/0.4ML ~~LOC~~ SOLN
40.0000 mg | SUBCUTANEOUS | Status: DC
  Administered 2014-03-03 – 2014-03-04 (×2): 40 mg via SUBCUTANEOUS
  Filled 2014-03-03 (×3): qty 0.4

## 2014-03-03 MED ORDER — ALBUTEROL SULFATE (2.5 MG/3ML) 0.083% IN NEBU: 2.5000 mg | INHALATION_SOLUTION | Freq: Four times a day (QID) | RESPIRATORY_TRACT | Status: DC | PRN

## 2014-03-03 MED ORDER — SODIUM CHLORIDE 0.9 % IV SOLN
INTRAVENOUS | Status: DC
  Administered 2014-03-04: 06:00:00 via INTRAVENOUS

## 2014-03-03 MED ORDER — ACETAMINOPHEN 325 MG PO TABS
650.0000 mg | ORAL_TABLET | Freq: Four times a day (QID) | ORAL | Status: DC | PRN
  Administered 2014-03-04: 650 mg via ORAL
  Filled 2014-03-03: qty 2

## 2014-03-03 MED ORDER — MOMETASONE FURO-FORMOTEROL FUM 100-5 MCG/ACT IN AERO
2.0000 | INHALATION_SPRAY | Freq: Two times a day (BID) | RESPIRATORY_TRACT | Status: DC
  Administered 2014-03-03 – 2014-03-04 (×3): 2 via RESPIRATORY_TRACT
  Filled 2014-03-03: qty 8.8

## 2014-03-03 MED ORDER — HYDROMORPHONE HCL PF 1 MG/ML IJ SOLN
1.0000 mg | INTRAMUSCULAR | Status: DC | PRN
  Administered 2014-03-03: 1 mg via INTRAVENOUS
  Filled 2014-03-03: qty 1

## 2014-03-03 MED ORDER — PROCHLORPERAZINE EDISYLATE 5 MG/ML IJ SOLN
10.0000 mg | Freq: Four times a day (QID) | INTRAMUSCULAR | Status: DC | PRN
  Filled 2014-03-03 (×2): qty 2

## 2014-03-03 MED ORDER — ACETAMINOPHEN 650 MG RE SUPP: 650.0000 mg | Freq: Four times a day (QID) | RECTAL | Status: DC | PRN

## 2014-03-03 MED ORDER — SODIUM CHLORIDE 0.9 % IV SOLN
INTRAVENOUS | Status: DC
  Administered 2014-03-03: 02:00:00 via INTRAVENOUS

## 2014-03-03 MED ORDER — PANTOPRAZOLE SODIUM 40 MG IV SOLR
40.0000 mg | Freq: Two times a day (BID) | INTRAVENOUS | Status: DC
  Administered 2014-03-03 – 2014-03-04 (×3): 40 mg via INTRAVENOUS
  Filled 2014-03-03 (×4): qty 40

## 2014-03-03 MED ORDER — ALBUTEROL SULFATE HFA 108 (90 BASE) MCG/ACT IN AERS: 2.0000 | INHALATION_SPRAY | Freq: Four times a day (QID) | RESPIRATORY_TRACT | Status: DC | PRN

## 2014-03-03 MED ORDER — PROMETHAZINE HCL 25 MG/ML IJ SOLN
12.5000 mg | INTRAMUSCULAR | Status: DC
  Filled 2014-03-03: qty 1

## 2014-03-03 MED ORDER — SODIUM CHLORIDE 0.9 % IV SOLN
INTRAVENOUS | Status: DC
  Administered 2014-03-03: 100 mL/h via INTRAVENOUS

## 2014-03-03 MED ORDER — HYDRALAZINE HCL 20 MG/ML IJ SOLN: 10.0000 mg | INTRAMUSCULAR | Status: DC | PRN

## 2014-03-03 MED ORDER — ONDANSETRON HCL 4 MG/2ML IJ SOLN
4.0000 mg | INTRAMUSCULAR | Status: DC | PRN
  Administered 2014-03-03 – 2014-03-04 (×5): 4 mg via INTRAVENOUS
  Filled 2014-03-03 (×5): qty 2

## 2014-03-03 MED ORDER — ONDANSETRON HCL 4 MG/2ML IJ SOLN
4.0000 mg | Freq: Four times a day (QID) | INTRAMUSCULAR | Status: DC | PRN
  Administered 2014-03-03: 4 mg via INTRAVENOUS
  Filled 2014-03-03: qty 2

## 2014-03-03 MED ORDER — MORPHINE SULFATE 2 MG/ML IJ SOLN
2.0000 mg | INTRAMUSCULAR | Status: DC | PRN
  Administered 2014-03-03 (×2): 2 mg via INTRAVENOUS
  Filled 2014-03-03 (×2): qty 1

## 2014-03-03 MED ORDER — ONDANSETRON HCL 4 MG/2ML IJ SOLN: 4.0000 mg | Freq: Four times a day (QID) | INTRAMUSCULAR | Status: DC | PRN

## 2014-03-03 MED ORDER — MORPHINE SULFATE 2 MG/ML IJ SOLN
1.0000 mg | INTRAMUSCULAR | Status: DC | PRN
  Administered 2014-03-03: 1 mg via INTRAVENOUS
  Filled 2014-03-03 (×2): qty 1

## 2014-03-03 NOTE — Progress Notes (Signed)
New Admission Note:   Arrival Method: per stretcher from ED with NT Estill Bamberg and daughter Tammy Mental Orientation: alert, oriented X4, calm, cooperative Telemetry: placed on box 5 after [facility] [employer] has been notified Assessment: Completed Skin: warm, dry, intact, no wounds noted IV: on the left antecubital, site is clean, dry and intact with transparent dressing Pain: 7 on a scale of 0-10, on the upper mid-abdomen. MD notified. PRN pain medicine ordered Tubes: IV line, on continuous pulse oxometry Safety Measures: Safety Fall Prevention Plan has been given, discussed and signed Admission: Completed 6 [place] Orientation: Patient has been orientated to the room, unit and staff.  Family: daughter at the bedside  Orders have been reviewed and implemented. Will continue to monitor the patient. Call light has been placed within reach and bed alarm has been activated.   Georgeanna Harrison BSN, RN [place] [address] [place]

## 2014-03-03 NOTE — ED Notes (Signed)
Report to receiving RN on 6E.

## 2014-03-03 NOTE — H&P (Signed)
[facility] History and Physical  [name] XNA:[id number] DOB: [birth year] DOA: 03/02/2014  Referring physician: ER physician. PCP: Donnie Coffin, MD   Chief Complaint: Abdominal pain.  HPI: [name] is a 73 y.o. female history of hypertension, bronchial asthma/cough, breast cancer status post mastectomy presents to the ER with complaints of abdominal pain. Patient has been having epigastric pain over the last 2 days which has been dull persistent. Patient has had some nausea but did not have any vomiting until patient received contrast for the CT abdomen and pelvis in the ER. Patient's last bowel movement was yesterday he denies any diarrhea. Denies having used any medication travel or any sick contacts. Denies any chest pain or shortness of breath. In the ER labs were unremarkable. CT abdomen and pelvis showed features concerning for acute focal pancreatitis with possible peptic ulcer disease. Patient has been admitted for further management. Patient has had multiple episodes of vomiting since admission and still has epigastric pain. Denies any blood in the vomitus.   Review of Systems: As presented in the history of presenting illness, rest negative.  Past Medical History  Diagnosis Date  . Mild aortic insufficiency 03/28/2006    echo  . H/O chest pain 03/24/2006    normal nuclear  study  . Sinus bradycardia on ECG   . Cancer     right breast mastectomy  . DDD (degenerative disc disease), cervical   . Normal cardiac stress test 2005  . Asthma    Past Surgical History  Procedure Laterality Date  . Mastectomy  1993    right breast  . Other surgical history      hysterectomy  . Rectal fissure    . Childbirth      x 2  . Tubal ligation    . Appendectomy      "30 years ago"  . Cholecystectomy      "15 years ago"   Social History:  reports that she has never smoked. She does not have any smokeless tobacco history on file. She reports that she does not drink alcohol  or use illicit drugs. Where does patient live home. Can patient participate in ADLs? Yes.  Allergies  Allergen Reactions  . Doxycycline     REACTION: GI upset  . Percocet [Oxycodone-Acetaminophen] Nausea And Vomiting    Family History:  Family History  Problem Relation Age of Onset  . Heart attack Mother   . Heart disease Mother   . Hypertension Mother   . Heart attack Father   . Heart disease Father   . Hypertension Sister   . Heart attack Sister   . Hypertension Sister   . Heart attack Sister       Prior to Admission medications   Medication Sig Start Date End Date Taking? Authorizing Provider  amLODipine (NORVASC) 5 MG tablet Take 5 mg by mouth daily.   Yes Historical Provider, MD  famotidine (PEPCID) 10 MG tablet Take 10 mg by mouth daily.   Yes Historical Provider, MD  Fluticasone-Salmeterol (ADVAIR) 250-50 MCG/DOSE AEPB Inhale 1 puff into the lungs daily as needed (for cough).   Yes Historical Provider, MD  loratadine (CLARITIN) 10 MG tablet Take 10 mg by mouth daily.   Yes Historical Provider, MD  montelukast (SINGULAIR) 10 MG tablet Take 10 mg by mouth at bedtime.   Yes Historical Provider, MD  OVER THE COUNTER MEDICATION Take 1 tablet by mouth at bedtime. OTC sinus medication   Yes Historical Provider, MD  albuterol (  PROAIR HFA) 108 (90 BASE) MCG/ACT inhaler Inhale 2 puffs into the lungs every 6 (six) hours as needed for wheezing or shortness of breath.    Historical Provider, MD    Physical Exam: Filed Vitals:   03/03/14 0030 03/03/14 0130 03/03/14 0200 03/03/14 0301  BP: 152/57 145/66 151/68 140/96  Pulse: 61 61 65 67  Temp:    97.8 F (36.6 C)  TempSrc:    Oral  Resp: 11 23 14 16   Height:    5\' 5"  (1.651 m)  Weight:    66.769 kg (147 lb 3.2 oz)  SpO2: 100% 94% 89% 94%     General:  Well-developed well-nourished.  Eyes: Anicteric no pallor.  ENT: No discharge from the ears eyes nose mouth.  Neck: No mass felt.  Cardiovascular: S1-S2  heard.  Respiratory: No rhonchi or crepitations.  Abdomen: Soft mild epigastric tenderness. No guarding or rigidity. Bowel sounds present.  Skin: No rash.  Musculoskeletal: No edema.  Psychiatric: Appears normal.  Neurologic: Alert awake oriented to time place and person. Moves all extremities.  Labs on Admission:  Basic Metabolic Panel:  Recent Labs Lab 03/02/14 2300  NA 142  K 4.2  CL 102  CO2 24  GLUCOSE 130*  BUN 15  CREATININE 0.74  CALCIUM 9.6   Liver Function Tests:  Recent Labs Lab 03/02/14 2300  AST 17  ALT 13  ALKPHOS 84  BILITOT 0.4  PROT 7.3  ALBUMIN 3.9    Recent Labs Lab 03/02/14 2300  LIPASE 46   No results found for this basename: AMMONIA,  in the last 168 hours CBC:  Recent Labs Lab 03/02/14 2300  WBC 9.5  NEUTROABS 7.0  HGB 12.7  HCT 36.3  MCV 86.6  PLT 183   Cardiac Enzymes:  Recent Labs Lab 03/02/14 2300  TROPONINI <0.30    BNP (last 3 results) No results found for this basename: PROBNP,  in the last 8760 hours CBG: No results found for this basename: GLUCAP,  in the last 168 hours  Radiological Exams on Admission: Dg Chest 2 View  03/03/2014   CLINICAL DATA:  Epigastric pain and shortness of breath for 3 days.  EXAM: CHEST  2 VIEW  COMPARISON:  12/20/2012  FINDINGS: Bilateral apical pleural thickening. The heart size and mediastinal contours are within normal limits. Both lungs are clear. The visualized skeletal structures are unremarkable. Postoperative changes with right mastectomy and surgical clips in the right axilla. Degenerative changes in the spine and shoulders. Surgical clips in the right upper quadrant.  IMPRESSION: No active cardiopulmonary disease.   Electronically Signed   By: Lucienne Capers M.D.   On: 03/03/2014 00:17   Ct Abdomen Pelvis W Contrast  03/03/2014   CLINICAL DATA:  Upper abdominal and epigastric pain. History of breast cancer.  EXAM: CT ABDOMEN AND PELVIS WITH CONTRAST  TECHNIQUE:  Multidetector CT imaging of the abdomen and pelvis was performed using the standard protocol following bolus administration of intravenous contrast.  CONTRAST:  128mL OMNIPAQUE IOHEXOL 300 MG/ML  SOLN  COMPARISON:  None.  FINDINGS: Atelectasis in the lung bases.  Right breast prosthesis.  Surgical absence of the gallbladder. There is infiltration or edema around the head of the pancreas and extending into the right anterior perirenal fascia and right lateral colonic gutter. Infiltration or edema extends around the duodenum bulb. There is mild intra and extrahepatic bile duct dilatation. Changes likely represent focal pancreatitis in the head of the pancreas possibly related to penetrating ulcer  disease in the adjacent duodenum. No loculated fluid collections. Homogeneous enhancement of the pancreatic parenchyma. No significant pancreatic ductal dilatation.  No focal liver lesions. The spleen, adrenal glands, kidneys, abdominal aorta, inferior vena cava, and retroperitoneal lymph nodes are unremarkable. The stomach, small bowel, and colon are not abnormally distended. No free air in the abdomen.  Pelvis: Appendix is surgically absent. No diverticulitis. Bladder wall is not thickened. Small amount of free fluid in the pelvis is likely related to the upper abdominal inflammatory process. No pelvic mass or lymphadenopathy. No destructive bone lesions.  IMPRESSION: Inflammatory process involving the duodenum and head of pancreas likely representing focal acute pancreatitis and possibly associated with duodenal peptic ulcer disease.   Electronically Signed   By: Lucienne Capers M.D.   On: 03/03/2014 01:13    EKG: Independently reviewed. Normal sinus rhythm.  Assessment/Plan Principal Problem:   Acute pancreatitis Active Problems:   ASTHMA   PUD (peptic ulcer disease)   HTN (hypertension)   Abdominal pain   1. Abdominal pain most likely secondary to acute pancreatitis versus peptic ulcer disease - patient  has been kept n.p.o. Protonix IV. Pain relief medication. Antiemetics. Gentle hydration. Repeat labs in a.m. including lipase amylase and LFTs. 2. Hypertension - since patient n.p.o. patient intake when necessary IV hydralazine. 3. History of asthma/chronic cough - continue inhalers.    Code Status: Full code.  Family Communication: Patient's daughter at the bedside.  Disposition Plan: Admit to inpatient.Marland Kitchen    KAKRAKANDY,ARSHAD N. [facility] Pager [phone].  If 7PM-7AM, please contact night-coverage [email or web address] Password [facility] 03/03/2014, 4:52 AM

## 2014-03-03 NOTE — Progress Notes (Signed)
Patient ID: RAIZY AUZENNE  female  GYF:[id number]    DOB: Oct 13, 1940    DOA: 03/02/2014  PCP: Donnie Coffin, MD  Assessment/Plan: Principal Problem:  Intractable nausea and vomiting, epigastric and right upper quadrant abdominal pain secondary to Acute pancreatitis versus peptic ulcer disease - Continue aggressive IV fluid hydration, lipase 25, n.p.o., antiemetics and IV pain control - CT abdomen and pelvis showed inflammatory process involving the duodenum and head of pancreas likely representing focal acute pancreatitis, possibly associated with duodenal peptic ulcer disease - The patient is post cholecystectomy, does not drink alcohol, will check lipid panel for triglycerides. - Discussed with gastroenterology, Dr. Benson Norway, recommended possible EGD in a.m. - Abdominal ultrasound unremarkable except acute pancreatitis and duodenal peptic ulcer disease  Active Problems:   ASTHMA: Currently stable, continue dulera    PUD (peptic ulcer disease) - Continue IV Protonix    HTN (hypertension) - Placed on hydralazine as needed  DVT Prophylaxis: Lovenox  Code Status: Full code  Family Communication: Discussed with family member at the bedside  Disposition:  Consultants:  Gastroenterology- Dr. Benson Norway  Procedures:  None  Antibiotics:  None    Subjective: Patient miserable with nausea and vomiting, epigastric pain, no fevers or chills  Objective: Weight change:   Intake/Output Summary (Last 24 hours) at 03/03/14 1042 Last data filed at 03/03/14 0854  Gross per 24 hour  Intake  56.25 ml  Output      0 ml  Net  56.25 ml   Blood pressure 129/78, pulse 61, temperature 97.6 F (36.4 C), temperature source Oral, resp. rate 16, height 5\' 5"  (1.651 m), weight 66.769 kg (147 lb 3.2 oz), SpO2 94.00%.  Physical Exam: General: Alert and awake, oriented x3, uncomfortable, nauseous and dry heaving CVS: S1-S2 clear, no murmur rubs or gallops Chest: clear to auscultation bilaterally,  no wheezing, rales or rhonchi Abdomen: soft epigastric and right upper quadrant tenderness  nondistended, normal bowel sounds  Extremities: no cyanosis, clubbing or edema noted bilaterally Neuro: Cranial nerves II-XII intact, no focal neurological deficits  Lab Results: Basic Metabolic Panel:  Recent Labs Lab 03/02/14 2300 03/03/14 0513  NA 142 138  K 4.2 3.8  CL 102 103  CO2 24 24  GLUCOSE 130* 162*  BUN 15 10  CREATININE 0.74 0.69  CALCIUM 9.6 8.5   Liver Function Tests:  Recent Labs Lab 03/02/14 2300 03/03/14 0513  AST 17 14  ALT 13 12  ALKPHOS 84 77  BILITOT 0.4 0.4  PROT 7.3 6.4  ALBUMIN 3.9 3.5    Recent Labs Lab 03/02/14 2300 03/03/14 0513  LIPASE 46 25  AMYLASE  --  39   No results found for this basename: AMMONIA,  in the last 168 hours CBC:  Recent Labs Lab 03/02/14 2300 03/03/14 0513  WBC 9.5 8.3  NEUTROABS 7.0 7.0  HGB 12.7 11.8*  HCT 36.3 34.8*  MCV 86.6 86.8  PLT 183 160   Cardiac Enzymes:  Recent Labs Lab 03/02/14 2300 03/03/14 0513  TROPONINI <0.30 <0.30   BNP: No components found with this basename: POCBNP,  CBG:  Recent Labs Lab 03/03/14 0546  GLUCAP 134*     Micro Results: No results found for this or any previous visit (from the past 240 hour(s)).  Studies/Results: Dg Chest 2 View  03/03/2014   CLINICAL DATA:  Epigastric pain and shortness of breath for 3 days.  EXAM: CHEST  2 VIEW  COMPARISON:  12/20/2012  FINDINGS: Bilateral apical pleural thickening. The heart size  and mediastinal contours are within normal limits. Both lungs are clear. The visualized skeletal structures are unremarkable. Postoperative changes with right mastectomy and surgical clips in the right axilla. Degenerative changes in the spine and shoulders. Surgical clips in the right upper quadrant.  IMPRESSION: No active cardiopulmonary disease.   Electronically Signed   By: Lucienne Capers M.D.   On: 03/03/2014 00:17   Ct Abdomen Pelvis W  Contrast  03/03/2014   CLINICAL DATA:  Upper abdominal and epigastric pain. History of breast cancer.  EXAM: CT ABDOMEN AND PELVIS WITH CONTRAST  TECHNIQUE: Multidetector CT imaging of the abdomen and pelvis was performed using the standard protocol following bolus administration of intravenous contrast.  CONTRAST:  167mL OMNIPAQUE IOHEXOL 300 MG/ML  SOLN  COMPARISON:  None.  FINDINGS: Atelectasis in the lung bases.  Right breast prosthesis.  Surgical absence of the gallbladder. There is infiltration or edema around the head of the pancreas and extending into the right anterior perirenal fascia and right lateral colonic gutter. Infiltration or edema extends around the duodenum bulb. There is mild intra and extrahepatic bile duct dilatation. Changes likely represent focal pancreatitis in the head of the pancreas possibly related to penetrating ulcer disease in the adjacent duodenum. No loculated fluid collections. Homogeneous enhancement of the pancreatic parenchyma. No significant pancreatic ductal dilatation.  No focal liver lesions. The spleen, adrenal glands, kidneys, abdominal aorta, inferior vena cava, and retroperitoneal lymph nodes are unremarkable. The stomach, small bowel, and colon are not abnormally distended. No free air in the abdomen.  Pelvis: Appendix is surgically absent. No diverticulitis. Bladder wall is not thickened. Small amount of free fluid in the pelvis is likely related to the upper abdominal inflammatory process. No pelvic mass or lymphadenopathy. No destructive bone lesions.  IMPRESSION: Inflammatory process involving the duodenum and head of pancreas likely representing focal acute pancreatitis and possibly associated with duodenal peptic ulcer disease.   Electronically Signed   By: Lucienne Capers M.D.   On: 03/03/2014 01:13    Medications: Scheduled Meds: . enoxaparin (LOVENOX) injection  40 mg Subcutaneous Q24H  . mometasone-formoterol  2 puff Inhalation BID  . pantoprazole  (PROTONIX) IV  40 mg Intravenous Q12H      LOS: 1 day   RAI,RIPUDEEP M.D. [facility] 03/03/2014, 10:42 AM Pager: [phone]  If 7PM-7AM, please contact night-coverage [email or web address] Password [email or web address]  **Disclaimer: This note was dictated with voice recognition software. Similar sounding words can inadvertently be transcribed and this note may contain transcription errors which may not have been corrected upon publication of note.**

## 2014-03-03 NOTE — Consult Note (Signed)
Unassigned Consult  Reason for Consult: Abnormal CT scan and Abdominal pain Referring Physician: [facility]  Francesco Runner HPI: This is a 73 year old female with  PMH of HTN, breast cancer s/p mastectomy, and asthma who presents to the ER with complaints of abdominal pain.  The epigastric pain started on Friday morning and it progressively worsened.  It was trivial initially, but the intensity increased to the point that she presented to the ER.  She denied these symptoms in the past and further evaluation with a CT scan reveals an acute focal pancreatis versus duodenal PUD.  She is s/p lap chole in the past and there is no abnormality in his liver enzymes or any ductal dilation.  No issues with vomiting prior to the ER admission, but it is now a problem for her.  Lipase level was normal.  She uses Aleve occasionally.  The patient denies any melena prior to Friday, but she did have black stools with the use of Peptobismol.  The medication did not relieve or improve her pain.  Past Medical History  Diagnosis Date  . Mild aortic insufficiency 03/28/2006    echo  . H/O chest pain 03/24/2006    normal nuclear  study  . Sinus bradycardia on ECG   . Cancer     right breast mastectomy  . DDD (degenerative disc disease), cervical   . Normal cardiac stress test 2005  . Asthma     Past Surgical History  Procedure Laterality Date  . Mastectomy  1993    right breast  . Other surgical history      hysterectomy  . Rectal fissure    . Childbirth      x 2  . Tubal ligation    . Appendectomy      "30 years ago"  . Cholecystectomy      "15 years ago"    Family History  Problem Relation Age of Onset  . Heart attack Mother   . Heart disease Mother   . Hypertension Mother   . Heart attack Father   . Heart disease Father   . Hypertension Sister   . Heart attack Sister   . Hypertension Sister   . Heart attack Sister     Social History:  reports that she has never smoked. She does not  have any smokeless tobacco history on file. She reports that she does not drink alcohol or use illicit drugs.  Allergies:  Allergies  Allergen Reactions  . Doxycycline     REACTION: GI upset  . Percocet [Oxycodone-Acetaminophen] Nausea And Vomiting    Medications:  Scheduled: . enoxaparin (LOVENOX) injection  40 mg Subcutaneous Q24H  . mometasone-formoterol  2 puff Inhalation BID  . pantoprazole (PROTONIX) IV  40 mg Intravenous Q12H   Continuous: . sodium chloride 125 mL/hr (03/03/14 0629)    Results for orders placed during the hospital encounter of 03/02/14 (from the past 24 hour(s))  CBC WITH DIFFERENTIAL     Status: None   Collection Time    03/02/14 11:00 PM      Result Value Ref Range   WBC 9.5  4.0 - 10.5 K/uL   RBC 4.19  3.87 - 5.11 MIL/uL   Hemoglobin 12.7  12.0 - 15.0 g/dL   HCT 36.3  36.0 - 46.0 %   MCV 86.6  78.0 - 100.0 fL   MCH 30.3  26.0 - 34.0 pg   MCHC 35.0  30.0 - 36.0 g/dL   RDW  13.1  11.5 - 15.5 %   Platelets 183  150 - 400 K/uL   Neutrophils Relative % 73  43 - 77 %   Neutro Abs 7.0  1.7 - 7.7 K/uL   Lymphocytes Relative 17  12 - 46 %   Lymphs Abs 1.6  0.7 - 4.0 K/uL   Monocytes Relative 8  3 - 12 %   Monocytes Absolute 0.7  0.1 - 1.0 K/uL   Eosinophils Relative 2  0 - 5 %   Eosinophils Absolute 0.2  0.0 - 0.7 K/uL   Basophils Relative 0  0 - 1 %   Basophils Absolute 0.0  0.0 - 0.1 K/uL  COMPREHENSIVE METABOLIC PANEL     Status: Abnormal   Collection Time    03/02/14 11:00 PM      Result Value Ref Range   Sodium 142  137 - 147 mEq/L   Potassium 4.2  3.7 - 5.3 mEq/L   Chloride 102  96 - 112 mEq/L   CO2 24  19 - 32 mEq/L   Glucose, Bld 130 (*) 70 - 99 mg/dL   BUN 15  6 - 23 mg/dL   Creatinine, Ser 0.74  0.50 - 1.10 mg/dL   Calcium 9.6  8.4 - 10.5 mg/dL   Total Protein 7.3  6.0 - 8.3 g/dL   Albumin 3.9  3.5 - 5.2 g/dL   AST 17  0 - 37 U/L   ALT 13  0 - 35 U/L   Alkaline Phosphatase 84  39 - 117 U/L   Total Bilirubin 0.4  0.3 - 1.2 mg/dL    GFR calc non Af Amer 82 (*) >90 mL/min   GFR calc Af Amer >90  >90 mL/min  LIPASE, BLOOD     Status: None   Collection Time    03/02/14 11:00 PM      Result Value Ref Range   Lipase 46  11 - 59 U/L  TROPONIN I     Status: None   Collection Time    03/02/14 11:00 PM      Result Value Ref Range   Troponin I <0.30  <0.30 ng/mL  I-STAT TROPOININ, ED     Status: None   Collection Time    03/02/14 11:05 PM      Result Value Ref Range   Troponin i, poc 0.00  0.00 - 0.08 ng/mL   Comment 3           LACTIC ACID, PLASMA     Status: None   Collection Time    03/02/14 11:10 PM      Result Value Ref Range   Lactic Acid, Venous 1.4  0.5 - 2.2 mmol/L  URINALYSIS, ROUTINE W REFLEX MICROSCOPIC     Status: Abnormal   Collection Time    03/03/14 12:25 AM      Result Value Ref Range   Color, Urine YELLOW  YELLOW   APPearance CLEAR  CLEAR   Specific Gravity, Urine 1.010  1.005 - 1.030   pH 7.5  5.0 - 8.0   Glucose, UA NEGATIVE  NEGATIVE mg/dL   Hgb urine dipstick NEGATIVE  NEGATIVE   Bilirubin Urine NEGATIVE  NEGATIVE   Ketones, ur NEGATIVE  NEGATIVE mg/dL   Protein, ur NEGATIVE  NEGATIVE mg/dL   Urobilinogen, UA 0.2  0.0 - 1.0 mg/dL   Nitrite NEGATIVE  NEGATIVE   Leukocytes, UA MODERATE (*) NEGATIVE  URINE MICROSCOPIC-ADD ON     Status: None  Collection Time    03/03/14 12:25 AM      Result Value Ref Range   WBC, UA 3-6  <3 WBC/hpf   RBC / HPF 0-2  <3 RBC/hpf   Bacteria, UA RARE  RARE  COMPREHENSIVE METABOLIC PANEL     Status: Abnormal   Collection Time    03/03/14  5:13 AM      Result Value Ref Range   Sodium 138  137 - 147 mEq/L   Potassium 3.8  3.7 - 5.3 mEq/L   Chloride 103  96 - 112 mEq/L   CO2 24  19 - 32 mEq/L   Glucose, Bld 162 (*) 70 - 99 mg/dL   BUN 10  6 - 23 mg/dL   Creatinine, Ser 0.69  0.50 - 1.10 mg/dL   Calcium 8.5  8.4 - 10.5 mg/dL   Total Protein 6.4  6.0 - 8.3 g/dL   Albumin 3.5  3.5 - 5.2 g/dL   AST 14  0 - 37 U/L   ALT 12  0 - 35 U/L   Alkaline  Phosphatase 77  39 - 117 U/L   Total Bilirubin 0.4  0.3 - 1.2 mg/dL   GFR calc non Af Amer 84 (*) >90 mL/min   GFR calc Af Amer >90  >90 mL/min  CBC WITH DIFFERENTIAL     Status: Abnormal   Collection Time    03/03/14  5:13 AM      Result Value Ref Range   WBC 8.3  4.0 - 10.5 K/uL   RBC 4.01  3.87 - 5.11 MIL/uL   Hemoglobin 11.8 (*) 12.0 - 15.0 g/dL   HCT 34.8 (*) 36.0 - 46.0 %   MCV 86.8  78.0 - 100.0 fL   MCH 29.4  26.0 - 34.0 pg   MCHC 33.9  30.0 - 36.0 g/dL   RDW 13.2  11.5 - 15.5 %   Platelets 160  150 - 400 K/uL   Neutrophils Relative % 84 (*) 43 - 77 %   Neutro Abs 7.0  1.7 - 7.7 K/uL   Lymphocytes Relative 10 (*) 12 - 46 %   Lymphs Abs 0.8  0.7 - 4.0 K/uL   Monocytes Relative 5  3 - 12 %   Monocytes Absolute 0.4  0.1 - 1.0 K/uL   Eosinophils Relative 1  0 - 5 %   Eosinophils Absolute 0.1  0.0 - 0.7 K/uL   Basophils Relative 0  0 - 1 %   Basophils Absolute 0.0  0.0 - 0.1 K/uL  LIPASE, BLOOD     Status: None   Collection Time    03/03/14  5:13 AM      Result Value Ref Range   Lipase 25  11 - 59 U/L  TROPONIN I     Status: None   Collection Time    03/03/14  5:13 AM      Result Value Ref Range   Troponin I <0.30  <0.30 ng/mL  AMYLASE     Status: None   Collection Time    03/03/14  5:13 AM      Result Value Ref Range   Amylase 39  0 - 105 U/L  LIPID PANEL     Status: Abnormal   Collection Time    03/03/14  5:13 AM      Result Value Ref Range   Cholesterol 169  0 - 200 mg/dL   Triglycerides 99  <150 mg/dL   HDL 47  >39 mg/dL  Total CHOL/HDL Ratio 3.6     VLDL 20  0 - 40 mg/dL   LDL Cholesterol 102 (*) 0 - 99 mg/dL  GLUCOSE, CAPILLARY     Status: Abnormal   Collection Time    03/03/14  5:46 AM      Result Value Ref Range   Glucose-Capillary 134 (*) 70 - 99 mg/dL     Dg Chest 2 View  03/03/2014   CLINICAL DATA:  Epigastric pain and shortness of breath for 3 days.  EXAM: CHEST  2 VIEW  COMPARISON:  12/20/2012  FINDINGS: Bilateral apical pleural thickening.  The heart size and mediastinal contours are within normal limits. Both lungs are clear. The visualized skeletal structures are unremarkable. Postoperative changes with right mastectomy and surgical clips in the right axilla. Degenerative changes in the spine and shoulders. Surgical clips in the right upper quadrant.  IMPRESSION: No active cardiopulmonary disease.   Electronically Signed   By: Lucienne Capers M.D.   On: 03/03/2014 00:17   US Abdomen Complete  03/03/2014   CLINICAL DATA:  Abdominal pain.  Pancreatitis.  Ductal dilation.  EXAM: ULTRASOUND ABDOMEN COMPLETE  COMPARISON:  CT abdomen and pelvis 03/03/2014  FINDINGS: Gallbladder:  The patient is status post cholecystectomy.  Common bile duct:  Diameter: 8 mm, within normal limits for age following cholecystectomy. No obstructing lesion is evident.  Liver:  No focal lesion identified. Within normal limits in parenchymal echogenicity.  IVC:  No abnormality visualized.  Pancreas:  Visualized portion unremarkable.  Spleen:  8.4 cm, within normal limits  Right Kidney:  Length: 9.0 cm, within normal limits. Echogenicity within normal limits. No mass or hydronephrosis visualized.  Left Kidney:  Length: 10.4 cm, within normal limits. Echogenicity within normal limits. No mass or hydronephrosis visualized.  Abdominal aorta:  No aneurysm visualized.  Other findings:  None.  IMPRESSION: 1. 8 mm common bile duct is within normal limits for age following cholecystectomy. 2. No obstructing lesion or stone identified within or around the duct. 3. Status post cholecystectomy. 4. Inflammatory changes surrounding the pancreas and duodenum are better appreciated on the CT scan.   Electronically Signed   By: Lawrence Santiago M.D.   On: 03/03/2014 09:14   Ct Abdomen Pelvis W Contrast  03/03/2014   CLINICAL DATA:  Upper abdominal and epigastric pain. History of breast cancer.  EXAM: CT ABDOMEN AND PELVIS WITH CONTRAST  TECHNIQUE: Multidetector CT imaging of the abdomen and  pelvis was performed using the standard protocol following bolus administration of intravenous contrast.  CONTRAST:  129mL OMNIPAQUE IOHEXOL 300 MG/ML  SOLN  COMPARISON:  None.  FINDINGS: Atelectasis in the lung bases.  Right breast prosthesis.  Surgical absence of the gallbladder. There is infiltration or edema around the head of the pancreas and extending into the right anterior perirenal fascia and right lateral colonic gutter. Infiltration or edema extends around the duodenum bulb. There is mild intra and extrahepatic bile duct dilatation. Changes likely represent focal pancreatitis in the head of the pancreas possibly related to penetrating ulcer disease in the adjacent duodenum. No loculated fluid collections. Homogeneous enhancement of the pancreatic parenchyma. No significant pancreatic ductal dilatation.  No focal liver lesions. The spleen, adrenal glands, kidneys, abdominal aorta, inferior vena cava, and retroperitoneal lymph nodes are unremarkable. The stomach, small bowel, and colon are not abnormally distended. No free air in the abdomen.  Pelvis: Appendix is surgically absent. No diverticulitis. Bladder wall is not thickened. Small amount of free fluid in the pelvis  is likely related to the upper abdominal inflammatory process. No pelvic mass or lymphadenopathy. No destructive bone lesions.  IMPRESSION: Inflammatory process involving the duodenum and head of pancreas likely representing focal acute pancreatitis and possibly associated with duodenal peptic ulcer disease.   Electronically Signed   By: Lucienne Capers M.D.   On: 03/03/2014 01:13    ROS:  As stated above in the HPI otherwise negative.  Blood pressure 129/78, pulse 61, temperature 97.6 F (36.4 C), temperature source Oral, resp. rate 16, height 5\' 5"  (1.651 m), weight 147 lb 3.2 oz (66.769 kg), SpO2 94.00%.    PE: Gen: Sleep and sick appearning HEENT:  [place]/AT, EOMI Neck: Supple, no LAD Lungs: CTA Bilaterally CV: RRR without  M/G/R ABM: Soft, epigastric tenderness, +BS Ext: No C/C/E  Assessment/Plan: 1) Acute pancreatitis versus PUD. 2) Epigastric pain. 3) Nausea and vomiting.   It is difficult to discern the etiology and further evaluation with an EGD will be beneficial.  She is hemodynamically stable and her HGB is at 11.8 g/dL.  Plan: 1) EGD tomorrow. 2) Continue with Protonix. 3) Monitor HGB and transfuse if necessary.  HUNG,PATRICK D 03/03/2014, 12:10 PM

## 2014-03-03 NOTE — ED Notes (Addendum)
Patient given ice chips per request. MD Thurnell Garbe approved.

## 2014-03-03 NOTE — Progress Notes (Addendum)
Received a call from Central Telemetry that patient is constantly tachycardic. Tachycardia triggered by vomiting. Parameters increased to 135/min. Will continue to monitor the patient.

## 2014-03-03 NOTE — ED Notes (Signed)
Pt had gone over to radiology for chest xray but states they did not do a CT scan.  Contacted CT.  They were unaware pt had finished oral contrast, which she has.  They will come after her now.

## 2014-03-03 NOTE — ED Notes (Signed)
Pt reports she thought the pain scale was opposite, high numbers were low pain and low numbers were high pain.  Based on this, she reports pain was 8-9 upon arrival in the ED.  Pain has improved with pain medication.

## 2014-03-04 ENCOUNTER — Encounter (HOSPITAL_COMMUNITY): Payer: Self-pay

## 2014-03-04 ENCOUNTER — Encounter (HOSPITAL_COMMUNITY)
Admission: EM | Disposition: A | Discharge status: Home / Self Care | Payer: Self-pay | Source: Home / Self Care | Attending: Internal Medicine

## 2014-03-04 DIAGNOSIS — K2100 Gastro-esophageal reflux disease with esophagitis, without bleeding: Secondary | ICD-10-CM

## 2014-03-04 DIAGNOSIS — J45909 Unspecified asthma, uncomplicated: Secondary | ICD-10-CM

## 2014-03-04 HISTORY — PX: ESOPHAGOGASTRODUODENOSCOPY: SHX5428

## 2014-03-04 LAB — COMPREHENSIVE METABOLIC PANEL
ALT: 10 U/L (ref 0–35)
AST: 18 U/L (ref 0–37)
Albumin: 3 g/dL — ABNORMAL LOW (ref 3.5–5.2)
Alkaline Phosphatase: 63 U/L (ref 39–117)
BILIRUBIN TOTAL: 0.3 mg/dL (ref 0.3–1.2)
BUN: 13 mg/dL (ref 6–23)
CHLORIDE: 109 meq/L (ref 96–112)
CO2: 23 meq/L (ref 19–32)
Calcium: 8.5 mg/dL (ref 8.4–10.5)
Creatinine, Ser: 0.75 mg/dL (ref 0.50–1.10)
GFR calc Af Amer: >90 mL/min (ref >90)
GFR, EST NON AFRICAN AMERICAN: 81 mL/min — AB (ref >90)
GLUCOSE: 88 mg/dL (ref 70–99)
Potassium: 4.2 mEq/L (ref 3.7–5.3)
Sodium: 143 mEq/L (ref 137–147)
Total Protein: 5.7 g/dL — ABNORMAL LOW (ref 6.0–8.3)

## 2014-03-04 LAB — CBC
HCT: 30.2 % — ABNORMAL LOW (ref 36.0–46.0)
Hemoglobin: 10.2 g/dL — ABNORMAL LOW (ref 12.0–15.0)
MCH: 29.7 pg (ref 26.0–34.0)
MCHC: 33.8 g/dL (ref 30.0–36.0)
MCV: 88 fL (ref 78.0–100.0)
PLATELETS: 155 10*3/uL (ref 150–400)
RBC: 3.43 MIL/uL — ABNORMAL LOW (ref 3.87–5.11)
RDW: 13.6 % (ref 11.5–15.5)
WBC: 5.8 10*3/uL (ref 4.0–10.5)

## 2014-03-04 LAB — URINE CULTURE: Colony Count: 60000

## 2014-03-04 LAB — GLUCOSE, CAPILLARY
GLUCOSE-CAPILLARY: 146 mg/dL — AB (ref 70–99)
Glucose-Capillary: 90 mg/dL (ref 70–99)
Glucose-Capillary: 90 mg/dL (ref 70–99)

## 2014-03-04 SURGERY — EGD (ESOPHAGOGASTRODUODENOSCOPY)
Anesthesia: Moderate Sedation

## 2014-03-04 MED ORDER — PANTOPRAZOLE SODIUM 40 MG PO TBEC
40.0000 mg | DELAYED_RELEASE_TABLET | Freq: Two times a day (BID) | ORAL | Status: DC
  Administered 2014-03-04: 40 mg via ORAL
  Filled 2014-03-04: qty 1

## 2014-03-04 MED ORDER — SODIUM CHLORIDE 0.9 % IV SOLN
INTRAVENOUS | Status: DC
  Administered 2014-03-04: 11:00:00 via INTRAVENOUS

## 2014-03-04 MED ORDER — MIDAZOLAM HCL 10 MG/2ML IJ SOLN
INTRAMUSCULAR | Status: DC | PRN
Start: 2014-03-04 — End: 2014-03-04
  Administered 2014-03-04: 2 mg via INTRAVENOUS
  Administered 2014-03-04: 1 mg via INTRAVENOUS

## 2014-03-04 MED ORDER — PANTOPRAZOLE SODIUM 40 MG PO TBEC: 40.0000 mg | DELAYED_RELEASE_TABLET | Freq: Two times a day (BID) | ORAL | Status: DC

## 2014-03-04 MED ORDER — FENTANYL CITRATE 0.05 MG/ML IJ SOLN
INTRAMUSCULAR | Status: AC
  Filled 2014-03-04: qty 2

## 2014-03-04 MED ORDER — ONDANSETRON 4 MG PO TBDP: 4.0000 mg | ORAL_TABLET | Freq: Three times a day (TID) | ORAL | Status: DC | PRN

## 2014-03-04 MED ORDER — TRAMADOL HCL 50 MG PO TABS: 50.0000 mg | ORAL_TABLET | Freq: Three times a day (TID) | ORAL | Status: DC | PRN

## 2014-03-04 MED ORDER — MIDAZOLAM HCL 5 MG/ML IJ SOLN
INTRAMUSCULAR | Status: AC
  Filled 2014-03-04: qty 2

## 2014-03-04 MED ORDER — FENTANYL CITRATE 0.05 MG/ML IJ SOLN
INTRAMUSCULAR | Status: DC | PRN
  Administered 2014-03-04: 15 ug via INTRAVENOUS
  Administered 2014-03-04: 25 ug via INTRAVENOUS

## 2014-03-04 NOTE — Op Note (Signed)
[facility] [place], [id number]   OPERATIVE PROCEDURE REPORT  PATIENT: [name], [name]  MR#: [id number] BIRTHDATE: 03/14/1941  GENDER: Female ENDOSCOPIST: Carol Ada, MD ASSISTANT:   Gunnar Fusi, RN and Corliss Parish, technician PROCEDURE DATE: 03/04/2014 PROCEDURE:   EGD w/ biopsy ASA CLASS:   Class II INDICATIONS:abnormal CT of the GI tract. MEDICATIONS: Versed 4 mg IV and Fentanyl 50 mcg IV TOPICAL ANESTHETIC:   none  DESCRIPTION OF PROCEDURE:   After the risks benefits and alternatives of the procedure were thoroughly explained, informed consent was obtained.  The Pentax Gastroscope [id number]  endoscope was introduced through the mouth  and advanced to the second portion of the duodenum Without limitations.      The instrument was slowly withdrawn as the mucosa was fully examined.    FINIDNGS: The esophagus was normal.  In the gastric lumen there was evidence of a mild gastritis and cold biopsies were obtained.  The duodenal bulb exhibited two large superficial, nonbleeding, clean-based ulcers.  The surrounding mucosa was raised and this may be from inflammation and edema, however, cold biopsies were obtained.   Retroflexed views revealed no abnormalities.     The scope was then withdrawn from the patient and the procedure terminated.  COMPLICATIONS: There were no complications. IMPRESSION: 1) Duodenal ulcers - nobleeding. 2) Gastritis.  RECOMMENDATIONS: 1) Continue with PPI. 2) Advance to a regular diet. 3) Await biopsy results. 4) Okay to D/C home.   _______________________________ eSigned:  Carol Ada, MD 03/04/2014 2:06 PM

## 2014-03-04 NOTE — Discharge Summary (Signed)
Physician Discharge Summary  Patient ID: [name] MRN: [id number] DOB/AGE: [birth year] 73 y.o.  Admit date: 03/02/2014 Discharge date: 03/04/2014  Primary Care Physician:  Donnie Coffin, MD  Discharge Diagnoses:    . Acute gastritis with peptic ulcer disease, with 2 duodenal ulcers  . ASTHMA . acute pancreatitis resolved  . HTN (hypertension) . Abdominal pain . Nausea with vomiting  Consults:  Gastroenterology, Dr. Benson Norway   Recommendations for Outpatient Follow-up:  Patient was recommended PPI bid x4 weeks, then once daily  Followup on the biopsy results  Allergies:   Allergies  Allergen Reactions  . Doxycycline     REACTION: GI upset  . Percocet [Oxycodone-Acetaminophen] Nausea And Vomiting     Discharge Medications:   Medication List    STOP taking these medications       famotidine 10 MG tablet  Commonly known as:  PEPCID      TAKE these medications       amLODipine 5 MG tablet  Commonly known as:  NORVASC  Take 5 mg by mouth daily.     Fluticasone-Salmeterol 250-50 MCG/DOSE Aepb  Commonly known as:  ADVAIR  Inhale 1 puff into the lungs daily as needed (for cough).     loratadine 10 MG tablet  Commonly known as:  CLARITIN  Take 10 mg by mouth daily.     montelukast 10 MG tablet  Commonly known as:  SINGULAIR  Take 10 mg by mouth at bedtime.     ondansetron 4 MG disintegrating tablet  Commonly known as:  ZOFRAN ODT  Take 1 tablet (4 mg total) by mouth every 8 (eight) hours as needed for nausea or vomiting.     OVER THE COUNTER MEDICATION  Take 1 tablet by mouth at bedtime. OTC sinus medication     pantoprazole 40 MG tablet  Commonly known as:  PROTONIX  Take 1 tablet (40 mg total) by mouth 2 (two) times daily before a meal. Twice a day for 4 weeks, then daily     PROAIR HFA 108 (90 BASE) MCG/ACT inhaler  Generic drug:  albuterol  Inhale 2 puffs into the lungs every 6 (six) hours as needed for wheezing or shortness of breath.     traMADol  50 MG tablet  Commonly known as:  ULTRAM  Take 1 tablet (50 mg total) by mouth every 8 (eight) hours as needed for severe pain.         Brief H and P: For complete details please refer to admission H and P, but in brief [name] is a 73 y.o. female history of hypertension, bronchial asthma/cough, breast cancer status post mastectomy presents to the ER with complaints of abdominal pain. Patient has been having epigastric pain over the last 2 days which has been dull persistent. Patient has had some nausea but did not have any vomiting until patient received contrast for the CT abdomen and pelvis in the ER. Patient's last bowel movement was a day before the admission, she denied any diarrhea. She denied having used any medication travel or any sick contacts. She denied any chest pain or shortness of breath. In the ER labs were unremarkable. CT abdomen and pelvis showed features concerning for acute focal pancreatitis with possible peptic ulcer disease. Patient was admitted for further management. Patient patient was still having multiple episodes of vomiting and epigastric pain and hence was admitted for further workup.   Hospital Course:   Acute peptic ulcer disease with duodenal ulcers, gastritis: Likely caused  by NSAIDs at home Patient was admitted with intractable nausea, vomiting and epigastric pain. Lipase was however normal at 46. Patient was placed on n.p.o. status with aggressive IV fluid hydration, antiemetics and pain control. CT abdomen and pelvis showed inflammatory process involving the duodenum and head of pancreas likely representing focal acute pancreatitis, possibly associated with duodenal peptic ulcer disease. The patient is post cholecystectomy, does not drink alcohol, triglycerides 99. Abdominal ultrasound was unremarkable except acute pancreatitis due to peptic ulcer disease. GI was consulted and patient underwent endoscopy which showed mild gastritis with 2 large  superficial, nonbleeding clean-based ulcers, biopsies obtained. Patient is placed on Protonix bid 4 weeks, then thereafter daily. Patient was recommended strongly to stop NSAIDs, she was given prescription for tramadol as needed. She is currently tolerating a solid diet without any difficulty or pain, nausea or vomiting   ASTHMA: Currently stable, continue dulera  PUD (peptic ulcer disease) : Continue PPI   HTN (hypertension) : Continually oral antihypertensives  Endoscopy/EGD 03/04/14 Dr Vilma Prader: The esophagus was normal. In the gastric lumen there was  evidence of a mild gastritis and cold biopsies were obtained. The  duodenal bulb exhibited two large superficial, nonbleeding,  clean-based ulcers. The surrounding mucosa was raised and this may  be from inflammation and edema, however, cold biopsies were  obtained. Retroflexed views revealed no abnormalities. The  scope was then withdrawn from the patient and the procedure  terminated.  COMPLICATIONS: There were no complications.  IMPRESSION:  1) Duodenal ulcers - nobleeding.  2) Gastritis.  RECOMMENDATIONS:  1) Continue with PPI.  2) Advance to a regular diet.  3) Await biopsy results.  Day of Discharge BP 144/62  Pulse 61  Temp(Src) 98 F (36.7 C) (Oral)  Resp 12  Ht 5\' 5"  (1.651 m)  Wt 67.223 kg (148 lb 3.2 oz)  BMI 24.66 kg/m2  SpO2 100%  Physical Exam: General: Alert and awake oriented x3 not in any acute distress. CVS: S1-S2 clear no murmur rubs or gallops Chest: clear to auscultation bilaterally, no wheezing rales or rhonchi Abdomen: soft mild epigastric tenderness significantly improved, nondistended, normal bowel sounds Extremities: no cyanosis, clubbing or edema noted bilaterally Neuro: Cranial nerves II-XII intact, no focal neurological deficits   The results of significant diagnostics from this hospitalization (including imaging, microbiology, ancillary and laboratory) are listed below for reference.     LAB RESULTS: Basic Metabolic Panel:  Recent Labs Lab 03/03/14 0513 03/04/14 0420  NA 138 143  K 3.8 4.2  CL 103 109  CO2 24 23  GLUCOSE 162* 88  BUN 10 13  CREATININE 0.69 0.75  CALCIUM 8.5 8.5   Liver Function Tests:  Recent Labs Lab 03/03/14 0513 03/04/14 0420  AST 14 18  ALT 12 10  ALKPHOS 77 63  BILITOT 0.4 0.3  PROT 6.4 5.7*  ALBUMIN 3.5 3.0*    Recent Labs Lab 03/02/14 2300 03/03/14 0513  LIPASE 46 25  AMYLASE  --  39   No results found for this basename: AMMONIA,  in the last 168 hours CBC:  Recent Labs Lab 03/03/14 0513 03/04/14 0420  WBC 8.3 5.8  NEUTROABS 7.0  --   HGB 11.8* 10.2*  HCT 34.8* 30.2*  MCV 86.8 88.0  PLT 160 155   Cardiac Enzymes:  Recent Labs Lab 03/02/14 2300 03/03/14 0513  TROPONINI <0.30 <0.30   BNP: No components found with this basename: POCBNP,  CBG:  Recent Labs Lab 03/04/14 0526 03/04/14 1125  GLUCAP 90  90    Significant Diagnostic Studies:  Dg Chest 2 View  03/03/2014   CLINICAL DATA:  Epigastric pain and shortness of breath for 3 days.  EXAM: CHEST  2 VIEW  COMPARISON:  12/20/2012  FINDINGS: Bilateral apical pleural thickening. The heart size and mediastinal contours are within normal limits. Both lungs are clear. The visualized skeletal structures are unremarkable. Postoperative changes with right mastectomy and surgical clips in the right axilla. Degenerative changes in the spine and shoulders. Surgical clips in the right upper quadrant.  IMPRESSION: No active cardiopulmonary disease.   Electronically Signed   By: Lucienne Capers M.D.   On: 03/03/2014 00:17   US Abdomen Complete  03/03/2014   CLINICAL DATA:  Abdominal pain.  Pancreatitis.  Ductal dilation.  EXAM: ULTRASOUND ABDOMEN COMPLETE  COMPARISON:  CT abdomen and pelvis 03/03/2014  FINDINGS: Gallbladder:  The patient is status post cholecystectomy.  Common bile duct:  Diameter: 8 mm, within normal limits for age following cholecystectomy. No  obstructing lesion is evident.  Liver:  No focal lesion identified. Within normal limits in parenchymal echogenicity.  IVC:  No abnormality visualized.  Pancreas:  Visualized portion unremarkable.  Spleen:  8.4 cm, within normal limits  Right Kidney:  Length: 9.0 cm, within normal limits. Echogenicity within normal limits. No mass or hydronephrosis visualized.  Left Kidney:  Length: 10.4 cm, within normal limits. Echogenicity within normal limits. No mass or hydronephrosis visualized.  Abdominal aorta:  No aneurysm visualized.  Other findings:  None.  IMPRESSION: 1. 8 mm common bile duct is within normal limits for age following cholecystectomy. 2. No obstructing lesion or stone identified within or around the duct. 3. Status post cholecystectomy. 4. Inflammatory changes surrounding the pancreas and duodenum are better appreciated on the CT scan.   Electronically Signed   By: Lawrence Santiago M.D.   On: 03/03/2014 09:14   Ct Abdomen Pelvis W Contrast  03/03/2014   CLINICAL DATA:  Upper abdominal and epigastric pain. History of breast cancer.  EXAM: CT ABDOMEN AND PELVIS WITH CONTRAST  TECHNIQUE: Multidetector CT imaging of the abdomen and pelvis was performed using the standard protocol following bolus administration of intravenous contrast.  CONTRAST:  126mL OMNIPAQUE IOHEXOL 300 MG/ML  SOLN  COMPARISON:  None.  FINDINGS: Atelectasis in the lung bases.  Right breast prosthesis.  Surgical absence of the gallbladder. There is infiltration or edema around the head of the pancreas and extending into the right anterior perirenal fascia and right lateral colonic gutter. Infiltration or edema extends around the duodenum bulb. There is mild intra and extrahepatic bile duct dilatation. Changes likely represent focal pancreatitis in the head of the pancreas possibly related to penetrating ulcer disease in the adjacent duodenum. No loculated fluid collections. Homogeneous enhancement of the pancreatic parenchyma. No  significant pancreatic ductal dilatation.  No focal liver lesions. The spleen, adrenal glands, kidneys, abdominal aorta, inferior vena cava, and retroperitoneal lymph nodes are unremarkable. The stomach, small bowel, and colon are not abnormally distended. No free air in the abdomen.  Pelvis: Appendix is surgically absent. No diverticulitis. Bladder wall is not thickened. Small amount of free fluid in the pelvis is likely related to the upper abdominal inflammatory process. No pelvic mass or lymphadenopathy. No destructive bone lesions.  IMPRESSION: Inflammatory process involving the duodenum and head of pancreas likely representing focal acute pancreatitis and possibly associated with duodenal peptic ulcer disease.   Electronically Signed   By: Lucienne Capers M.D.   On: 03/03/2014 01:13  Disposition and Follow-up: Discharge Instructions   Diet - low sodium heart healthy    Complete by:  As directed      Discharge instructions    Complete by:  As directed   Please take protonix 40mg  twice a day for 4 weeks then daily after that.     Increase activity slowly    Complete by:  As directed             DISPOSITION: HOME  DIET: HEART HEALTHY   DISCHARGE FOLLOW-UP Follow-up Information   Follow up with [facility], [place]. Schedule an appointment as soon as possible for a visit in 2 weeks. (for hospital follow-up)    Specialty:  Family Medicine   Contact information:   [address] [place] [address] [phone]       Follow up with HUNG,PATRICK D, MD. Schedule an appointment as soon as possible for a visit in 4 weeks. (for hospital follow-up, As needed, If symptoms worsen)    Specialty:  Gastroenterology   Contact information:   [address] [place]  [address] [phone]       Time spent on Discharge: 58 MINS  Signed:   RAI,RIPUDEEP M.D. [facility] 03/04/2014, 3:41 PM Pager: [phone]   **Disclaimer: This note was  dictated with voice recognition software. Similar sounding words can inadvertently be transcribed and this note may contain transcription errors which may not have been corrected upon publication of note.**

## 2014-03-04 NOTE — Interval H&P Note (Signed)
History and Physical Interval Note:  03/04/2014 1:41 PM  [name]  has presented today for surgery, with the diagnosis of Epigastric pain and abnormal CT scan  The various methods of treatment have been discussed with the patient and family. After consideration of risks, benefits and other options for treatment, the patient has consented to  Procedure(s): ESOPHAGOGASTRODUODENOSCOPY (EGD) (N/A) as a surgical intervention .  The patient's history has been reviewed, patient examined, no change in status, stable for surgery.  I have reviewed the patient's chart and labs.  Questions were answered to the patient's satisfaction.     HUNG,PATRICK D

## 2014-03-04 NOTE — Progress Notes (Signed)
Patient ID: [name]  female  OVZ:[id number]    DOB: [birth year]    DOA: 03/02/2014  PCP: Donnie Coffin, MD  Assessment/Plan: Principal Problem:  Intractable nausea and vomiting, epigastric and right upper quadrant abdominal pain secondary to Acute pancreatitis versus peptic ulcer disease -Significantly improved today, no vomiting, still having epigastric pain, decrease IV fluid hydration - CT abdomen and pelvis showed inflammatory process involving the duodenum and head of pancreas likely representing focal acute pancreatitis, possibly associated with duodenal peptic ulcer disease - The patient is post cholecystectomy, does not drink alcohol, triglycerides 99 - Abdominal ultrasound unremarkable except acute pancreatitis and duodenal peptic ulcer disease - GI consulted, Dr. Benson Norway, plan for EGD today  Active Problems:   ASTHMA: Currently stable, continue dulera    PUD (peptic ulcer disease) - Continue IV Protonix    HTN (hypertension) - Placed on hydralazine as needed  DVT Prophylaxis: Lovenox  Code Status: Full code  Family Communication: Discussed with patient's husband at the bedside  Disposition: Will start on liquid diet after the EGD, hopefully will be stable for DC tomorrow  Consultants:  Gastroenterology- Dr. Benson Norway  Procedures:  None  Antibiotics:  None    Subjective: Patient feels a whole lot better today, still having some epigastric pain but no nausea vomiting  Objective: Weight change: 0.998 kg (2 lb 3.2 oz)  Intake/Output Summary (Last 24 hours) at 03/04/14 1221 Last data filed at 03/04/14 0930  Gross per 24 hour  Intake      0 ml  Output      0 ml  Net      0 ml   Blood pressure 132/73, pulse 58, temperature 97.3 F (36.3 C), temperature source Oral, resp. rate 18, height 5\' 5"  (1.651 m), weight 67.223 kg (148 lb 3.2 oz), SpO2 92.00%.  Physical Exam: General: Alert and awake, oriented x3, pleasant CVS: S1-S2 clear, no murmur rubs or  gallops Chest: clear to auscultation bilaterally, no wheezing, rales or rhonchi Abdomen: soft epigastric and right upper quadrant tenderness  nondistended, normal bowel sounds  Extremities: no cyanosis, clubbing or edema noted bilaterally   Lab Results: Basic Metabolic Panel:  Recent Labs Lab 03/03/14 0513 03/04/14 0420  NA 138 143  K 3.8 4.2  CL 103 109  CO2 24 23  GLUCOSE 162* 88  BUN 10 13  CREATININE 0.69 0.75  CALCIUM 8.5 8.5   Liver Function Tests:  Recent Labs Lab 03/03/14 0513 03/04/14 0420  AST 14 18  ALT 12 10  ALKPHOS 77 63  BILITOT 0.4 0.3  PROT 6.4 5.7*  ALBUMIN 3.5 3.0*    Recent Labs Lab 03/02/14 2300 03/03/14 0513  LIPASE 46 25  AMYLASE  --  39   No results found for this basename: AMMONIA,  in the last 168 hours CBC:  Recent Labs Lab 03/03/14 0513 03/04/14 0420  WBC 8.3 5.8  NEUTROABS 7.0  --   HGB 11.8* 10.2*  HCT 34.8* 30.2*  MCV 86.8 88.0  PLT 160 155   Cardiac Enzymes:  Recent Labs Lab 03/02/14 2300 03/03/14 0513  TROPONINI <0.30 <0.30   BNP: No components found with this basename: POCBNP,  CBG:  Recent Labs Lab 03/03/14 1209 03/03/14 1738 03/03/14 2343 03/04/14 0526 03/04/14 1125  GLUCAP 122* 104* 101* 90 90     Micro Results: Recent Results (from the past 240 hour(s))  URINE CULTURE     Status: None   Collection Time    03/03/14 12:25 AM  Result Value Ref Range Status   Specimen Description URINE, RANDOM   Final   Culture  Setup Time     Final   Value: 03/03/2014 01:03     Performed at [facility]     Final   Value: 60,000 COLONIES/ML     Performed at [facility]   Culture     Final   Value: Multiple bacterial morphotypes present, none predominant. Suggest appropriate recollection if clinically indicated.     Performed at [facility]   Report Status 03/04/2014 FINAL   Final    Studies/Results: Dg Chest 2 View  03/03/2014   CLINICAL DATA:  Epigastric  pain and shortness of breath for 3 days.  EXAM: CHEST  2 VIEW  COMPARISON:  12/20/2012  FINDINGS: Bilateral apical pleural thickening. The heart size and mediastinal contours are within normal limits. Both lungs are clear. The visualized skeletal structures are unremarkable. Postoperative changes with right mastectomy and surgical clips in the right axilla. Degenerative changes in the spine and shoulders. Surgical clips in the right upper quadrant.  IMPRESSION: No active cardiopulmonary disease.   Electronically Signed   By: Lucienne Capers M.D.   On: 03/03/2014 00:17   Ct Abdomen Pelvis W Contrast  03/03/2014   CLINICAL DATA:  Upper abdominal and epigastric pain. History of breast cancer.  EXAM: CT ABDOMEN AND PELVIS WITH CONTRAST  TECHNIQUE: Multidetector CT imaging of the abdomen and pelvis was performed using the standard protocol following bolus administration of intravenous contrast.  CONTRAST:  121mL OMNIPAQUE IOHEXOL 300 MG/ML  SOLN  COMPARISON:  None.  FINDINGS: Atelectasis in the lung bases.  Right breast prosthesis.  Surgical absence of the gallbladder. There is infiltration or edema around the head of the pancreas and extending into the right anterior perirenal fascia and right lateral colonic gutter. Infiltration or edema extends around the duodenum bulb. There is mild intra and extrahepatic bile duct dilatation. Changes likely represent focal pancreatitis in the head of the pancreas possibly related to penetrating ulcer disease in the adjacent duodenum. No loculated fluid collections. Homogeneous enhancement of the pancreatic parenchyma. No significant pancreatic ductal dilatation.  No focal liver lesions. The spleen, adrenal glands, kidneys, abdominal aorta, inferior vena cava, and retroperitoneal lymph nodes are unremarkable. The stomach, small bowel, and colon are not abnormally distended. No free air in the abdomen.  Pelvis: Appendix is surgically absent. No diverticulitis. Bladder wall is not  thickened. Small amount of free fluid in the pelvis is likely related to the upper abdominal inflammatory process. No pelvic mass or lymphadenopathy. No destructive bone lesions.  IMPRESSION: Inflammatory process involving the duodenum and head of pancreas likely representing focal acute pancreatitis and possibly associated with duodenal peptic ulcer disease.   Electronically Signed   By: Lucienne Capers M.D.   On: 03/03/2014 01:13    Medications: Scheduled Meds: . enoxaparin (LOVENOX) injection  40 mg Subcutaneous Q24H  . mometasone-formoterol  2 puff Inhalation BID  . pantoprazole (PROTONIX) IV  40 mg Intravenous Q12H      LOS: 2 days   RAI,RIPUDEEP M.D. [facility] 03/04/2014, 12:21 PM Pager: [phone]  If 7PM-7AM, please contact night-coverage [email or web address] Password [email or web address]  **Disclaimer: This note was dictated with voice recognition software. Similar sounding words can inadvertently be transcribed and this note may contain transcription errors which may not have been corrected upon publication of note.**

## 2014-03-04 NOTE — Progress Notes (Signed)
Pt back from endo via bed. Pt alert and oriented.

## 2014-03-04 NOTE — Progress Notes (Signed)
Pt NPO for EGD today. Denies pain. Pleasant, husband at bedside.

## 2014-03-04 NOTE — H&P (View-Only) (Signed)
Patient ID: [name]  female  [id number]    DOB: 02/14/1941    DOA: 03/02/2014  PCP: Donnie Coffin, MD  Assessment/Plan: Principal Problem:  Intractable nausea and vomiting, epigastric and right upper quadrant abdominal pain secondary to Acute pancreatitis versus peptic ulcer disease -Significantly improved today, no vomiting, still having epigastric pain, decrease IV fluid hydration - CT abdomen and pelvis showed inflammatory process involving the duodenum and head of pancreas likely representing focal acute pancreatitis, possibly associated with duodenal peptic ulcer disease - The patient is post cholecystectomy, does not drink alcohol, triglycerides 99 - Abdominal ultrasound unremarkable except acute pancreatitis and duodenal peptic ulcer disease - GI consulted, Dr. Benson Norway, plan for EGD today  Active Problems:   ASTHMA: Currently stable, continue dulera    PUD (peptic ulcer disease) - Continue IV Protonix    HTN (hypertension) - Placed on hydralazine as needed  DVT Prophylaxis: Lovenox  Code Status: Full code  Family Communication: Discussed with patient's husband at the bedside  Disposition: Will start on liquid diet after the EGD, hopefully will be stable for DC tomorrow  Consultants:  Gastroenterology- Dr. Benson Norway  Procedures:  None  Antibiotics:  None    Subjective: Patient feels a whole lot better today, still having some epigastric pain but no nausea vomiting  Objective: Weight change: 0.998 kg (2 lb 3.2 oz)  Intake/Output Summary (Last 24 hours) at 03/04/14 1221 Last data filed at 03/04/14 0930  Gross per 24 hour  Intake      0 ml  Output      0 ml  Net      0 ml   Blood pressure 132/73, pulse 58, temperature 97.3 F (36.3 C), temperature source Oral, resp. rate 18, height 5\' 5"  (1.651 m), weight 67.223 kg (148 lb 3.2 oz), SpO2 92.00%.  Physical Exam: General: Alert and awake, oriented x3, pleasant CVS: S1-S2 clear, no murmur rubs or  gallops Chest: clear to auscultation bilaterally, no wheezing, rales or rhonchi Abdomen: soft epigastric and right upper quadrant tenderness  nondistended, normal bowel sounds  Extremities: no cyanosis, clubbing or edema noted bilaterally   Lab Results: Basic Metabolic Panel:  Recent Labs Lab 03/03/14 0513 03/04/14 0420  NA 138 143  K 3.8 4.2  CL 103 109  CO2 24 23  GLUCOSE 162* 88  BUN 10 13  CREATININE 0.69 0.75  CALCIUM 8.5 8.5   Liver Function Tests:  Recent Labs Lab 03/03/14 0513 03/04/14 0420  AST 14 18  ALT 12 10  ALKPHOS 77 63  BILITOT 0.4 0.3  PROT 6.4 5.7*  ALBUMIN 3.5 3.0*    Recent Labs Lab 03/02/14 2300 03/03/14 0513  LIPASE 46 25  AMYLASE  --  39   No results found for this basename: AMMONIA,  in the last 168 hours CBC:  Recent Labs Lab 03/03/14 0513 03/04/14 0420  WBC 8.3 5.8  NEUTROABS 7.0  --   HGB 11.8* 10.2*  HCT 34.8* 30.2*  MCV 86.8 88.0  PLT 160 155   Cardiac Enzymes:  Recent Labs Lab 03/02/14 2300 03/03/14 0513  TROPONINI <0.30 <0.30   BNP: No components found with this basename: POCBNP,  CBG:  Recent Labs Lab 03/03/14 1209 03/03/14 1738 03/03/14 2343 03/04/14 0526 03/04/14 1125  GLUCAP 122* 104* 101* 90 90     Micro Results: Recent Results (from the past 240 hour(s))  URINE CULTURE     Status: None   Collection Time    03/03/14 12:25 AM  Result Value Ref Range Status   Specimen Description URINE, RANDOM   Final   Culture  Setup Time     Final   Value: 03/03/2014 01:03     Performed at [place]     Final   Value: 60,000 COLONIES/ML     Performed at [facility]   Culture     Final   Value: Multiple bacterial morphotypes present, none predominant. Suggest appropriate recollection if clinically indicated.     Performed at [facility]   Report Status 03/04/2014 FINAL   Final    Studies/Results: Dg Chest 2 View  03/03/2014   CLINICAL DATA:  Epigastric  pain and shortness of breath for 3 days.  EXAM: CHEST  2 VIEW  COMPARISON:  12/20/2012  FINDINGS: Bilateral apical pleural thickening. The heart size and mediastinal contours are within normal limits. Both lungs are clear. The visualized skeletal structures are unremarkable. Postoperative changes with right mastectomy and surgical clips in the right axilla. Degenerative changes in the spine and shoulders. Surgical clips in the right upper quadrant.  IMPRESSION: No active cardiopulmonary disease.   Electronically Signed   By: Lucienne Capers M.D.   On: 03/03/2014 00:17   Ct Abdomen Pelvis W Contrast  03/03/2014   CLINICAL DATA:  Upper abdominal and epigastric pain. History of breast cancer.  EXAM: CT ABDOMEN AND PELVIS WITH CONTRAST  TECHNIQUE: Multidetector CT imaging of the abdomen and pelvis was performed using the standard protocol following bolus administration of intravenous contrast.  CONTRAST:  172mL OMNIPAQUE IOHEXOL 300 MG/ML  SOLN  COMPARISON:  None.  FINDINGS: Atelectasis in the lung bases.  Right breast prosthesis.  Surgical absence of the gallbladder. There is infiltration or edema around the head of the pancreas and extending into the right anterior perirenal fascia and right lateral colonic gutter. Infiltration or edema extends around the duodenum bulb. There is mild intra and extrahepatic bile duct dilatation. Changes likely represent focal pancreatitis in the head of the pancreas possibly related to penetrating ulcer disease in the adjacent duodenum. No loculated fluid collections. Homogeneous enhancement of the pancreatic parenchyma. No significant pancreatic ductal dilatation.  No focal liver lesions. The spleen, adrenal glands, kidneys, abdominal aorta, inferior vena cava, and retroperitoneal lymph nodes are unremarkable. The stomach, small bowel, and colon are not abnormally distended. No free air in the abdomen.  Pelvis: Appendix is surgically absent. No diverticulitis. Bladder wall is not  thickened. Small amount of free fluid in the pelvis is likely related to the upper abdominal inflammatory process. No pelvic mass or lymphadenopathy. No destructive bone lesions.  IMPRESSION: Inflammatory process involving the duodenum and head of pancreas likely representing focal acute pancreatitis and possibly associated with duodenal peptic ulcer disease.   Electronically Signed   By: Lucienne Capers M.D.   On: 03/03/2014 01:13    Medications: Scheduled Meds: . enoxaparin (LOVENOX) injection  40 mg Subcutaneous Q24H  . mometasone-formoterol  2 puff Inhalation BID  . pantoprazole (PROTONIX) IV  40 mg Intravenous Q12H      LOS: 2 days   RAI,RIPUDEEP M.D. [facility] 03/04/2014, 12:21 PM Pager: [phone]  If 7PM-7AM, please contact night-coverage [email or web address] Password [email or web address]  **Disclaimer: This note was dictated with voice recognition software. Similar sounding words can inadvertently be transcribed and this note may contain transcription errors which may not have been corrected upon publication of note.**

## 2014-03-04 NOTE — Progress Notes (Signed)
Pt discharge instructions given, prescriptions given. Pt left floor via wheelchair accompanied by staff and family.

## 2014-03-05 ENCOUNTER — Encounter (HOSPITAL_COMMUNITY): Payer: Self-pay | Admitting: Gastroenterology

## 2014-03-05 DIAGNOSIS — H10029 Other mucopurulent conjunctivitis, unspecified eye: Secondary | ICD-10-CM | POA: Diagnosis not present

## 2014-03-24 DIAGNOSIS — K2970 Gastritis, unspecified, without bleeding: Secondary | ICD-10-CM | POA: Diagnosis not present

## 2014-03-24 DIAGNOSIS — K2990 Gastroduodenitis, unspecified, without bleeding: Secondary | ICD-10-CM | POA: Diagnosis not present

## 2014-03-24 DIAGNOSIS — R933 Abnormal findings on diagnostic imaging of other parts of digestive tract: Secondary | ICD-10-CM | POA: Diagnosis not present

## 2014-03-24 DIAGNOSIS — R1013 Epigastric pain: Secondary | ICD-10-CM | POA: Diagnosis not present

## 2014-03-24 DIAGNOSIS — K269 Duodenal ulcer, unspecified as acute or chronic, without hemorrhage or perforation: Secondary | ICD-10-CM | POA: Diagnosis not present

## 2014-04-08 ENCOUNTER — Other Ambulatory Visit: Payer: Self-pay

## 2014-04-08 DIAGNOSIS — Z1231 Encounter for screening mammogram for malignant neoplasm of breast: Secondary | ICD-10-CM

## 2014-04-08 DIAGNOSIS — Z9011 Acquired absence of right breast and nipple: Secondary | ICD-10-CM

## 2014-06-16 ENCOUNTER — Ambulatory Visit: Payer: Medicare Other

## 2014-06-30 ENCOUNTER — Emergency Department (HOSPITAL_COMMUNITY)
Admission: EM | Admit: 2014-06-30 | Discharge: 2014-07-01 | Disposition: A | Discharge status: Home / Self Care | Payer: Medicare Other | Attending: Emergency Medicine | Admitting: Emergency Medicine

## 2014-06-30 ENCOUNTER — Encounter (HOSPITAL_COMMUNITY): Payer: Self-pay | Admitting: Emergency Medicine

## 2014-06-30 ENCOUNTER — Emergency Department (HOSPITAL_COMMUNITY): Payer: Medicare Other

## 2014-06-30 DIAGNOSIS — Z79899 Other long term (current) drug therapy: Secondary | ICD-10-CM | POA: Diagnosis not present

## 2014-06-30 DIAGNOSIS — I7774 Dissection of vertebral artery: Secondary | ICD-10-CM | POA: Diagnosis not present

## 2014-06-30 DIAGNOSIS — Z7982 Long term (current) use of aspirin: Secondary | ICD-10-CM | POA: Insufficient documentation

## 2014-06-30 DIAGNOSIS — M5030 Other cervical disc degeneration, unspecified cervical region: Secondary | ICD-10-CM | POA: Insufficient documentation

## 2014-06-30 DIAGNOSIS — R42 Dizziness and giddiness: Secondary | ICD-10-CM

## 2014-06-30 DIAGNOSIS — I10 Essential (primary) hypertension: Secondary | ICD-10-CM | POA: Insufficient documentation

## 2014-06-30 DIAGNOSIS — J45909 Unspecified asthma, uncomplicated: Secondary | ICD-10-CM | POA: Insufficient documentation

## 2014-06-30 DIAGNOSIS — I6521 Occlusion and stenosis of right carotid artery: Secondary | ICD-10-CM | POA: Diagnosis not present

## 2014-06-30 DIAGNOSIS — H538 Other visual disturbances: Secondary | ICD-10-CM | POA: Diagnosis not present

## 2014-06-30 DIAGNOSIS — R51 Headache: Secondary | ICD-10-CM | POA: Diagnosis not present

## 2014-06-30 DIAGNOSIS — Z7951 Long term (current) use of inhaled steroids: Secondary | ICD-10-CM | POA: Diagnosis not present

## 2014-06-30 DIAGNOSIS — G450 Vertebro-basilar artery syndrome: Secondary | ICD-10-CM | POA: Diagnosis not present

## 2014-06-30 DIAGNOSIS — R112 Nausea with vomiting, unspecified: Secondary | ICD-10-CM | POA: Diagnosis not present

## 2014-06-30 DIAGNOSIS — Z859 Personal history of malignant neoplasm, unspecified: Secondary | ICD-10-CM | POA: Insufficient documentation

## 2014-06-30 HISTORY — DX: Essential (primary) hypertension: I10

## 2014-06-30 LAB — COMPREHENSIVE METABOLIC PANEL
ALT: 17 U/L (ref 0–35)
ANION GAP: 15 (ref 5–15)
AST: 19 U/L (ref 0–37)
Albumin: 4.4 g/dL (ref 3.5–5.2)
Alkaline Phosphatase: 86 U/L (ref 39–117)
BILIRUBIN TOTAL: 0.4 mg/dL (ref 0.3–1.2)
BUN: 21 mg/dL (ref 6–23)
CHLORIDE: 102 meq/L (ref 96–112)
CO2: 24 meq/L (ref 19–32)
Calcium: 9.6 mg/dL (ref 8.4–10.5)
Creatinine, Ser: 0.98 mg/dL (ref 0.50–1.10)
GFR, EST AFRICAN AMERICAN: 64 mL/min — AB (ref >90)
GFR, EST NON AFRICAN AMERICAN: 55 mL/min — AB (ref >90)
Glucose, Bld: 102 mg/dL — ABNORMAL HIGH (ref 70–99)
Potassium: 4.1 mEq/L (ref 3.7–5.3)
Sodium: 141 mEq/L (ref 137–147)
Total Protein: 7.8 g/dL (ref 6.0–8.3)

## 2014-06-30 LAB — URINALYSIS, ROUTINE W REFLEX MICROSCOPIC
Bilirubin Urine: NEGATIVE
GLUCOSE, UA: NEGATIVE mg/dL
Hgb urine dipstick: NEGATIVE
Ketones, ur: NEGATIVE mg/dL
Nitrite: NEGATIVE
PH: 7 (ref 5.0–8.0)
Protein, ur: 30 mg/dL — AB
SPECIFIC GRAVITY, URINE: 1.017 (ref 1.005–1.030)
Urobilinogen, UA: 0.2 mg/dL (ref 0.0–1.0)

## 2014-06-30 LAB — RAPID URINE DRUG SCREEN, HOSP PERFORMED
Amphetamines: NOT DETECTED
BARBITURATES: NOT DETECTED
Benzodiazepines: NOT DETECTED
Cocaine: NOT DETECTED
Opiates: NOT DETECTED
Tetrahydrocannabinol: NOT DETECTED

## 2014-06-30 LAB — DIFFERENTIAL
BASOS ABS: 0 10*3/uL (ref 0.0–0.1)
Basophils Relative: 1 % (ref 0–1)
EOS PCT: 2 % (ref 0–5)
Eosinophils Absolute: 0.1 10*3/uL (ref 0.0–0.7)
LYMPHS PCT: 35 % (ref 12–46)
Lymphs Abs: 2.1 10*3/uL (ref 0.7–4.0)
MONO ABS: 0.3 10*3/uL (ref 0.1–1.0)
MONOS PCT: 5 % (ref 3–12)
NEUTROS ABS: 3.4 10*3/uL (ref 1.7–7.7)
Neutrophils Relative %: 57 % (ref 43–77)

## 2014-06-30 LAB — I-STAT CHEM 8, ED
BUN: 21 mg/dL (ref 6–23)
CALCIUM ION: 1.15 mmol/L (ref 1.13–1.30)
CREATININE: 1 mg/dL (ref 0.50–1.10)
Chloride: 106 mEq/L (ref 96–112)
GLUCOSE: 104 mg/dL — AB (ref 70–99)
HCT: 42 % (ref 36.0–46.0)
Hemoglobin: 14.3 g/dL (ref 12.0–15.0)
Potassium: 3.9 mEq/L (ref 3.7–5.3)
Sodium: 140 mEq/L (ref 137–147)
TCO2: 23 mmol/L (ref 0–100)

## 2014-06-30 LAB — CBC
HEMATOCRIT: 38.9 % (ref 36.0–46.0)
HEMOGLOBIN: 14.2 g/dL (ref 12.0–15.0)
MCH: 30.9 pg (ref 26.0–34.0)
MCHC: 36.5 g/dL — ABNORMAL HIGH (ref 30.0–36.0)
MCV: 84.7 fL (ref 78.0–100.0)
Platelets: 200 10*3/uL (ref 150–400)
RBC: 4.59 MIL/uL (ref 3.87–5.11)
RDW: 13.1 % (ref 11.5–15.5)
WBC: 6 10*3/uL (ref 4.0–10.5)

## 2014-06-30 LAB — I-STAT TROPONIN, ED: TROPONIN I, POC: 0 ng/mL (ref 0.00–0.08)

## 2014-06-30 LAB — PROTIME-INR
INR: 1 (ref 0.00–1.49)
Prothrombin Time: 13.3 seconds (ref 11.6–15.2)

## 2014-06-30 LAB — URINE MICROSCOPIC-ADD ON

## 2014-06-30 LAB — APTT: aPTT: 28 seconds (ref 24–37)

## 2014-06-30 LAB — ETHANOL: Alcohol, Ethyl (B): <11 mg/dL (ref 0–11)

## 2014-06-30 MED ORDER — ACETAMINOPHEN 325 MG PO TABS
650.0000 mg | ORAL_TABLET | Freq: Once | ORAL | Status: AC
  Administered 2014-06-30: 650 mg via ORAL
  Filled 2014-06-30: qty 2

## 2014-06-30 MED ORDER — PROMETHAZINE HCL 25 MG/ML IJ SOLN
12.5000 mg | Freq: Once | INTRAMUSCULAR | Status: DC
  Filled 2014-06-30: qty 1

## 2014-06-30 MED ORDER — ONDANSETRON HCL 4 MG/2ML IJ SOLN
INTRAMUSCULAR | Status: AC
  Administered 2014-06-30: 4 mg via INTRAVENOUS
  Filled 2014-06-30: qty 2

## 2014-06-30 MED ORDER — ONDANSETRON HCL 4 MG/2ML IJ SOLN
4.0000 mg | Freq: Once | INTRAMUSCULAR | Status: AC
  Administered 2014-06-30: 4 mg via INTRAVENOUS
  Filled 2014-06-30: qty 2

## 2014-06-30 MED ORDER — IOHEXOL 350 MG/ML SOLN
50.0000 mL | Freq: Once | INTRAVENOUS | Status: AC | PRN
  Administered 2014-06-30: 50 mL via INTRAVENOUS

## 2014-06-30 MED ORDER — ASPIRIN 81 MG PO CHEW: 324.0000 mg | CHEWABLE_TABLET | Freq: Every day | ORAL | Status: DC

## 2014-06-30 MED ORDER — ONDANSETRON HCL 4 MG/2ML IJ SOLN
4.0000 mg | Freq: Once | INTRAMUSCULAR | Status: AC
  Administered 2014-06-30: 4 mg via INTRAVENOUS

## 2014-06-30 MED ORDER — MECLIZINE HCL 25 MG PO TABS: 25.0000 mg | ORAL_TABLET | Freq: Three times a day (TID) | ORAL | Status: DC | PRN

## 2014-06-30 NOTE — ED Notes (Addendum)
Pt in c/o sudden onset of dizziness, this caused nausea and vomiting, pt states during this episode her vision was blurred, also reports severe headache at this time- symptoms started around 1630 but they seem to be improving at this time. No neuro deficits noted to extremities.  MD Wilson Singer to triage to evaluate patient.

## 2014-06-30 NOTE — Discharge Instructions (Signed)
Vertebral Artery Dissection The vertebral arteries are major arteries at the base of the neck. They carry blood from the heart to the brain. Vertebral artery dissection is a condition in which a tear develops in the artery wall, allowing blood to collect within the layers of the wall. The blood that collects between the layers may form a clot. This can lead to a stroke if not diagnosed and treated right away. Vertebral artery dissection occurs most often in middle-aged people. It is a common cause of stroke in people younger than 45 years. CAUSES   Injury to the head or neck. The injury may range from mild to severe.  Weak blood vessel walls. The walls may tear even when no outside injury occurs. This is called spontaneous dissection. SYMPTOMS  Symptoms usually appear within days of an injury, but sometimes they do not appear for weeks or years. Symptoms may include:  Pain in the head, neck, eye, or face. The pain is typically stabbing, sharp, and unusual.  Difficulty speaking.  Hoarseness.  Loss of feeling in the torso, legs, or arms.  Loss of sense of taste.  Hiccups.  Vertigo.  Dizziness.  Double vision.  Nausea and vomiting.  Difficulty swallowing.  Loss of balance.  Hearing loss.  Ear pain. DIAGNOSIS  A physical exam will be performed. To diagnose this condition, your caregiver may order various tests, such as:  Computed tomography (CT).  Magnetic resonance imaging (MRI).  Magnetic resonance angiography.  Ultrasonography.  Blood tests. TREATMENT  Immediate treatment is often required to reduce the risk of stroke. Vertebral artery dissection is usually treated with medicines that prevent blood from clotting (anticoagulant and antiplatelet medicines). In some cases, surgery is done to repair the tear in the blood vessel. HOME CARE INSTRUCTIONS  Only take over-the-counter or prescription medicines as directed by your caregiver. Take all medicines exactly as  instructed.  Maintain a healthy weight.  Get regular exercise. Check with your caregiver before starting any new type of exercise.  Do not smoke.  Follow up with your caregiver as directed. SEEK MEDICAL CARE IF:  Your symptoms are not getting better. SEEK IMMEDIATE MEDICAL CARE IF:  You feel weak or dizzy.  You notice changes in your vision or speech.  You have trouble breathing.  You have a sudden, severe headache with no known cause.  You have a loss of balance or coordination.  You have numbness in your face, arm, or leg. Any of these symptoms may represent a serious problem that is an emergency. Do not wait to see if the symptoms will go away. Get medical help right away. Call your local emergency services (911 in [place] Do not drive yourself to the hospital. MAKE SURE YOU:  Understand these instructions.  Will watch your condition.  Will get help right away if you are not doing well or get worse. Document Released: 08/15/2012 Document Reviewed: 08/15/2012 [facility] Patient Information 2015 [place]. This information is not intended to replace advice given to you by your health care provider. Make sure you discuss any questions you have with your health care provider.  Vertigo Vertigo means you feel like you or your surroundings are moving when they are not. Vertigo can be dangerous if it occurs when you are at work, driving, or performing difficult activities.  CAUSES  Vertigo occurs when there is a conflict of signals sent to your brain from the visual and sensory systems in your body. There are many different causes of vertigo, including:  Infections, especially in the inner ear.  A bad reaction to a drug or misuse of alcohol and medicines.  Withdrawal from drugs or alcohol.  Rapidly changing positions, such as lying down or rolling over in bed.  A migraine headache.  Decreased blood flow to the brain.  Increased pressure in the brain from a head  injury, infection, tumor, or bleeding. SYMPTOMS  You may feel as though the world is spinning around or you are falling to the ground. Because your balance is upset, vertigo can cause nausea and vomiting. You may have involuntary eye movements (nystagmus). DIAGNOSIS  Vertigo is usually diagnosed by physical exam. If the cause of your vertigo is unknown, your caregiver may perform imaging tests, such as an MRI scan (magnetic resonance imaging). TREATMENT  Most cases of vertigo resolve on their own, without treatment. Depending on the cause, your caregiver may prescribe certain medicines. If your vertigo is related to body position issues, your caregiver may recommend movements or procedures to correct the problem. In rare cases, if your vertigo is caused by certain inner ear problems, you may need surgery. HOME CARE INSTRUCTIONS   Follow your caregiver's instructions.  Avoid driving.  Avoid operating heavy machinery.  Avoid performing any tasks that would be dangerous to you or others during a vertigo episode.  Tell your caregiver if you notice that certain medicines seem to be causing your vertigo. Some of the medicines used to treat vertigo episodes can actually make them worse in some people. SEEK IMMEDIATE MEDICAL CARE IF:   Your medicines do not relieve your vertigo or are making it worse.  You develop problems with talking, walking, weakness, or using your arms, hands, or legs.  You develop severe headaches.  Your nausea or vomiting continues or gets worse.  You develop visual changes.  A family member notices behavioral changes.  Your condition gets worse. MAKE SURE YOU:  Understand these instructions.  Will watch your condition.  Will get help right away if you are not doing well or get worse. Document Released: 06/08/2005 Document Revised: 11/21/2011 Document Reviewed: 03/17/2011 [employer] Patient Information 2015 [place], [place]. This information is not intended to  replace advice given to you by your health care provider. Make sure you discuss any questions you have with your health care provider.

## 2014-06-30 NOTE — ED Notes (Signed)
Pt reports continues nausea and 8/10 HA. Neuro remains intact. Pt denies wanting any medication at this time. AO x4.

## 2014-06-30 NOTE — ED Provider Notes (Signed)
CSN: [id number]     Arrival date & time 06/30/14  1717 History   First MD Initiated Contact with Patient 06/30/14 1727     Chief Complaint  Patient presents with  . Dizziness     (Consider location/radiation/quality/duration/timing/severity/associated sxs/prior Treatment) Patient is a 73 y.o. female presenting with dizziness. The history is provided by the patient.  Dizziness Associated symptoms: headaches   Associated symptoms: no chest pain, no diarrhea, no nausea, no shortness of breath and no vomiting    patient had acute onset of vertigo while sitting down today. She states it felt as if she is floating her everything was spinning around. States it was hard to look at things. Some nausea no vomiting. She also had a headache in the back or head began prior to this. She states the dizziness is improved somewhat. No other numbness or weakness. No confusion. No fevers. No trauma. No history of stroke.  Past Medical History  Diagnosis Date  . Mild aortic insufficiency 03/28/2006    echo  . H/O chest pain 03/24/2006    normal nuclear  study  . Sinus bradycardia on ECG   . Cancer     right breast mastectomy  . DDD (degenerative disc disease), cervical   . Normal cardiac stress test 2005  . Asthma   . PONV (postoperative nausea and vomiting)   . Hypertension    Past Surgical History  Procedure Laterality Date  . Mastectomy  1993    right breast  . Other surgical history      hysterectomy  . Rectal fissure    . Childbirth      x 2  . Tubal ligation    . Appendectomy      "30 years ago"  . Cholecystectomy      "15 years ago"  . Esophagogastroduodenoscopy N/A 03/04/2014    Procedure: ESOPHAGOGASTRODUODENOSCOPY (EGD);  Surgeon: [name], MD;  Location: [facility] ENDOSCOPY;  Service: Endoscopy;  Laterality: N/A;   Family History  Problem Relation Age of Onset  . Heart attack Mother   . Heart disease Mother   . Hypertension Mother   . Heart attack Father   . Heart disease  Father   . Hypertension Sister   . Heart attack Sister   . Hypertension Sister   . Heart attack Sister    History  Substance Use Topics  . Smoking status: Never Smoker   . Smokeless tobacco: Not on file  . Alcohol Use: No   OB History   Grav Para Term Preterm Abortions TAB SAB Ect Mult Living                 Review of Systems  Constitutional: Negative for activity change and appetite change.  Eyes: Negative for pain.  Respiratory: Negative for chest tightness and shortness of breath.   Cardiovascular: Negative for chest pain and leg swelling.  Gastrointestinal: Negative for nausea, vomiting, abdominal pain and diarrhea.  Genitourinary: Negative for flank pain.  Musculoskeletal: Negative for back pain and neck stiffness.  Skin: Negative for rash.  Neurological: Positive for dizziness and headaches. Negative for weakness and numbness.  Psychiatric/Behavioral: Negative for behavioral problems.      Allergies  Doxycycline and Percocet  Home Medications   Prior to Admission medications   Medication Sig Start Date End Date Taking? Authorizing Provider  albuterol (PROAIR HFA) 108 (90 BASE) MCG/ACT inhaler Inhale 2 puffs into the lungs every 6 (six) hours as needed for wheezing or shortness of breath.  Yes Historical Provider, MD  amLODipine (NORVASC) 5 MG tablet Take 5 mg by mouth daily.   Yes Historical Provider, MD  Fluticasone-Salmeterol (ADVAIR) 250-50 MCG/DOSE AEPB Inhale 1 puff into the lungs daily as needed (for cough).   Yes Historical Provider, MD  loratadine (CLARITIN) 10 MG tablet Take 10 mg by mouth daily.   Yes Historical Provider, MD  montelukast (SINGULAIR) 10 MG tablet Take 10 mg by mouth at bedtime.   Yes Historical Provider, MD  OVER THE COUNTER MEDICATION Take 1 tablet by mouth at bedtime. OTC sinus medication   Yes Historical Provider, MD  pantoprazole (PROTONIX) 40 MG tablet Take 1 tablet (40 mg total) by mouth 2 (two) times daily before a meal. Twice a day  for 4 weeks, then daily 03/04/14  Yes Ripudeep Krystal Eaton, MD  aspirin 81 MG chewable tablet Chew 4 tablets (324 mg total) by mouth daily. 06/30/14   Jasper Riling. Alvino Chapel, MD  meclizine (ANTIVERT) 25 MG tablet Take 1 tablet (25 mg total) by mouth 3 (three) times daily as needed for dizziness. 06/30/14   Jasper Riling. Khori Rosevear, MD   BP 181/68  Pulse 57  Temp(Src) 97.8 F (36.6 C) (Oral)  Resp 16  SpO2 99% Physical Exam  Nursing note and vitals reviewed. Constitutional: She is oriented to person, place, and time. She appears well-developed and well-nourished.  HENT:  Head: Normocephalic and atraumatic.  Bilateral TMs normal  Eyes: EOM are normal. Pupils are equal, round, and reactive to light.  Neck: Normal range of motion. Neck supple.  Cardiovascular: Normal rate, regular rhythm and normal heart sounds.   No murmur heard. Pulmonary/Chest: Effort normal and breath sounds normal. No respiratory distress. She has no wheezes. She has no rales.  Abdominal: Soft. Bowel sounds are normal. She exhibits no distension. There is no tenderness. There is no rebound and no guarding.  Musculoskeletal: Normal range of motion.  Neurological: She is alert and oriented to person, place, and time. No cranial nerve deficit.  Finger-nose intact bilaterally. Extraocular movements intact. Nystagmus with end gaze to right and left, but worse on left. Good grip strength bilaterally.  Skin: Skin is warm and dry.  Psychiatric: She has a normal mood and affect. Her speech is normal.    ED Course  Procedures (including critical care time) Labs Review Labs Reviewed  CBC - Abnormal; Notable for the following:    MCHC 36.5 (*)    All other components within normal limits  COMPREHENSIVE METABOLIC PANEL - Abnormal; Notable for the following:    Glucose, Bld 102 (*)    GFR calc non Af Amer 55 (*)    GFR calc Af Amer 64 (*)    All other components within normal limits  URINALYSIS, ROUTINE W REFLEX MICROSCOPIC - Abnormal;  Notable for the following:    Protein, ur 30 (*)    Leukocytes, UA TRACE (*)    All other components within normal limits  I-STAT CHEM 8, ED - Abnormal; Notable for the following:    Glucose, Bld 104 (*)    All other components within normal limits  ETHANOL  PROTIME-INR  APTT  DIFFERENTIAL  URINE RAPID DRUG SCREEN (HOSP PERFORMED)  URINE MICROSCOPIC-ADD ON  I-STAT TROPOININ, ED    Imaging Review Ct Angio Head W/cm &/or Wo Cm  06/30/2014   CLINICAL DATA:  Headache for 1 month, RIGHT neck pain. Acute onset dizziness today. Assess vertigo.  EXAM: CT ANGIOGRAPHY HEAD AND NECK  TECHNIQUE: Multidetector CT imaging of the  head and neck was performed using the standard protocol during bolus administration of intravenous contrast. Multiplanar CT image reconstructions and MIPs were obtained to evaluate the vascular anatomy. Carotid stenosis measurements (when applicable) are obtained utilizing NASCET criteria, using the distal internal carotid diameter as the denominator.  CONTRAST:  73mL OMNIPAQUE IOHEXOL 350 MG/ML SOLN  COMPARISON:  CT of the head June 30, 2014 at 1838 hr and, MRI of the head June 30, 2014 at 2113 hr  FINDINGS: CTA HEAD FINDINGS  Anterior circulation: Normal appearance of the cervical internal carotid arteries, petrous, cavernous and supra clinoid internal carotid arteries. Widely patent anterior communicating artery. Normal appearance of the anterior and middle cerebral arteries with exception of mild distal luminal irregularity suggesting atherosclerosis. Congenitally diminutive versus absent LEFT A1 segment, the LEFT anterior cerebral artery appears arise from the RIGHT A1-2 junction.  Posterior circulation: RIGHT vertebral artery is dominant eccentric calcification of the intradural RIGHT vertebral artery which remains patent, approximately 50% narrowed. Vertebral artery terminates in the posterior inferior cerebellar artery. There is complete occlusion of the basilar artery at  the level the pons, bilateral posterior communicating arteries are present, with retrograde flow into the distal basilar artery. Thready irregular appearance of the RIGHT greater than LEFT P1 segments, posterior cerebral arteries remain patent.  No contrast extravasation or aneurysm within the anterior nor posterior circulation.  Review of the MIP images confirms the above findings.  CTA NECK FINDINGS  Normal appearance of the thoracic arch, normal branch pattern. The origins of the innominate, left Common carotid artery and subclavian artery are widely patent. 2 mm eccentric calcific atherosclerosis at origin LEFT subclavian vein. Mild calcific atherosclerosis of the aortic arch. Luminal irregularity of the RIGHT subclavian artery most consistent with atherosclerosis, for example carinal 136/179.  Bilateral Common carotid arteries are widely patent, coursing in a straight line fashion. Eccentric intimal thickening and to lesser extent calcific atherosclerosis of the carotid bulbs resultant 50% narrowing of the origin of the RIGHT internal are carotid artery by NASCET criteria, no hemodynamically significant stenosis of LEFT internal carotid artery. Normal appearance of the included internal carotid arteries.  Left vertebral artery is dominant. Normal appearance of the vertebral arteries to the level of the distal foraminal segment, however on the LEFT (coronal 120 1/179) areas there is intimal irregularity of the distal foraminal LEFT vertebral artery without dissection flap. Focal luminal irregularity of the LEFT vertebral artery transitioning to the intradural segment with less than 50% narrowing, axial 86/153. The LEFT vertebral artery terminates in the LEFT posterior inferior cerebellar artery.  No hemodynamically significant stenosis by NASCET criteria. No pseudoaneurysm. No contrast extravasation.  Soft tissues are non acute; RIGHT apical reticular nodular scarring and pleural thickening. Sub cm nodules in  the thyroid gland, below follow-up recommendations. No acute osseous process though bone windows have not been submitted. Mild cervical spondylosis without acute fracture nor malalignment.  Review of the MIP images confirms the above findings.  IMPRESSION: CTA HEAD: Mid basilar artery occlusion, likely chronic. Bilateral posterior communicating arteries present and patent.  At least 50% stenosis of the RIGHT intradural vertebral artery, the LEFT vertebral artery terminates in posterior inferior cerebellar artery.  Luminal irregularity of the posterior cerebral arteries, to lesser extent the anterior and middle cerebral arteries consistent with atherosclerosis.  CTA NECK: Luminal irregularity of the distal foraminal LEFT vertebral artery suggests chronic dissection, without dissection flap or occlusion.  50% stenosis the origin the RIGHT internal carotid artery by NASCET criteria.  Acute findings discussed  with and reconfirmed by Dr.Calin Fantroy on 06/30/2014 at 11:15 pm.   Electronically Signed   By: Elon Alas   On: 06/30/2014 23:20   Ct Head Wo Contrast  06/30/2014   CLINICAL DATA:  Dizziness.  EXAM: CT HEAD WITHOUT CONTRAST  TECHNIQUE: Contiguous axial images were obtained from the base of the skull through the vertex without intravenous contrast.  COMPARISON:  None.  FINDINGS: Bony calvarium appears intact. No mass effect or midline shift is noted. Ventricular size is within normal limits. There is no evidence of mass lesion, hemorrhage or acute infarction.  IMPRESSION: Normal head CT.   Electronically Signed   By: Sabino Dick M.D.   On: 06/30/2014 18:46   Ct Angio Neck W/cm &/or Wo/cm  06/30/2014   CLINICAL DATA:  Headache for 1 month, RIGHT neck pain. Acute onset dizziness today. Assess vertigo.  EXAM: CT ANGIOGRAPHY HEAD AND NECK  TECHNIQUE: Multidetector CT imaging of the head and neck was performed using the standard protocol during bolus administration of intravenous contrast.  Multiplanar CT image reconstructions and MIPs were obtained to evaluate the vascular anatomy. Carotid stenosis measurements (when applicable) are obtained utilizing NASCET criteria, using the distal internal carotid diameter as the denominator.  CONTRAST:  79mL OMNIPAQUE IOHEXOL 350 MG/ML SOLN  COMPARISON:  CT of the head June 30, 2014 at 1838 hr and, MRI of the head June 30, 2014 at 2113 hr  FINDINGS: CTA HEAD FINDINGS  Anterior circulation: Normal appearance of the cervical internal carotid arteries, petrous, cavernous and supra clinoid internal carotid arteries. Widely patent anterior communicating artery. Normal appearance of the anterior and middle cerebral arteries with exception of mild distal luminal irregularity suggesting atherosclerosis. Congenitally diminutive versus absent LEFT A1 segment, the LEFT anterior cerebral artery appears arise from the RIGHT A1-2 junction.  Posterior circulation: RIGHT vertebral artery is dominant eccentric calcification of the intradural RIGHT vertebral artery which remains patent, approximately 50% narrowed. Vertebral artery terminates in the posterior inferior cerebellar artery. There is complete occlusion of the basilar artery at the level the pons, bilateral posterior communicating arteries are present, with retrograde flow into the distal basilar artery. Thready irregular appearance of the RIGHT greater than LEFT P1 segments, posterior cerebral arteries remain patent.  No contrast extravasation or aneurysm within the anterior nor posterior circulation.  Review of the MIP images confirms the above findings.  CTA NECK FINDINGS  Normal appearance of the thoracic arch, normal branch pattern. The origins of the innominate, left Common carotid artery and subclavian artery are widely patent. 2 mm eccentric calcific atherosclerosis at origin LEFT subclavian vein. Mild calcific atherosclerosis of the aortic arch. Luminal irregularity of the RIGHT subclavian artery most  consistent with atherosclerosis, for example carinal 136/179.  Bilateral Common carotid arteries are widely patent, coursing in a straight line fashion. Eccentric intimal thickening and to lesser extent calcific atherosclerosis of the carotid bulbs resultant 50% narrowing of the origin of the RIGHT internal are carotid artery by NASCET criteria, no hemodynamically significant stenosis of LEFT internal carotid artery. Normal appearance of the included internal carotid arteries.  Left vertebral artery is dominant. Normal appearance of the vertebral arteries to the level of the distal foraminal segment, however on the LEFT (coronal 120 1/179) areas there is intimal irregularity of the distal foraminal LEFT vertebral artery without dissection flap. Focal luminal irregularity of the LEFT vertebral artery transitioning to the intradural segment with less than 50% narrowing, axial 86/153. The LEFT vertebral artery terminates in the LEFT posterior inferior  cerebellar artery.  No hemodynamically significant stenosis by NASCET criteria. No pseudoaneurysm. No contrast extravasation.  Soft tissues are non acute; RIGHT apical reticular nodular scarring and pleural thickening. Sub cm nodules in the thyroid gland, below follow-up recommendations. No acute osseous process though bone windows have not been submitted. Mild cervical spondylosis without acute fracture nor malalignment.  Review of the MIP images confirms the above findings.  IMPRESSION: CTA HEAD: Mid basilar artery occlusion, likely chronic. Bilateral posterior communicating arteries present and patent.  At least 50% stenosis of the RIGHT intradural vertebral artery, the LEFT vertebral artery terminates in posterior inferior cerebellar artery.  Luminal irregularity of the posterior cerebral arteries, to lesser extent the anterior and middle cerebral arteries consistent with atherosclerosis.  CTA NECK: Luminal irregularity of the distal foraminal LEFT vertebral artery  suggests chronic dissection, without dissection flap or occlusion.  50% stenosis the origin the RIGHT internal carotid artery by NASCET criteria.  Acute findings discussed with and reconfirmed by Dr.Lashawnda Hancox on 06/30/2014 at 11:15 pm.   Electronically Signed   By: Elon Alas   On: 06/30/2014 23:20   [name] Contrast  06/30/2014   CLINICAL DATA:  Sudden onset of dizziness, nausea and vomiting. Blurred vision. Severe headache. Symptoms began at 1630 today but are improving.  EXAM: MRI HEAD WITHOUT CONTRAST  TECHNIQUE: Multiplanar, multiecho pulse sequences of the brain and surrounding structures were obtained without intravenous contrast.  COMPARISON:  CT head 06/30/2014.  FINDINGS: No evidence for acute infarction, hemorrhage, mass lesion, hydrocephalus, or extra-axial fluid. Mild cerebral and cerebellar atrophy. Mild to moderate subcortical and periventricular white matter T2 and FLAIR hyperintensity consistent with chronic microvascular ischemic change.  Flow voids are maintained throughout the BILATERAL carotid and BILATERAL vertebral arteries. The basilar artery appears mildly thick-walled, without as robust the flow void as the other vessels. Consider CTA of the head/neck for further evaluation, to exclude a symptomatic basilar stenosis, given in the symptoms suggestive of vertebrobasilar insufficiency.  There are no areas of chronic hemorrhage. Pituitary, pineal, and cerebellar tonsils unremarkable. No upper cervical lesions. Visualized calvarium, skull base, and upper cervical osseous structures unremarkable. Scalp and extracranial soft tissues, orbits, sinuses, and mastoids show no acute process. Shotty cervical lymph nodes. Good general agreement with prior normal CT.  IMPRESSION: No acute stroke or intracranial mass lesion.  Mild atrophy and mild-to-moderate small vessel disease.  The basilar artery appears diffusely thick-walled, without as robust a flow void as might be expected  from the size of the carotid arteries and vertebral arteries. Consider CTA head/neck for further evaluation.   Electronically Signed   By: Rolla Flatten M.D.   On: 06/30/2014 21:40     EKG Interpretation None      MDM   Final diagnoses:  Vertigo  Basilar insufficiency  Vertebral artery dissection    Patient with vertigo. Improved. CT scan reassuring. Discussed with neurology and will get MRIs since symptoms have improved. MRI showed some questionable vertebral basilar issues. CTA done and showed chronic basilar insufficiency and chronic vertebral artery dissection. Discussed with neurology again, he recommended starting aspirin and followup as an outpatient with neurology. Will discharge home    Jasper Riling. Alvino Chapel, MD 06/30/14 [phone]

## 2014-07-01 ENCOUNTER — Telehealth (HOSPITAL_COMMUNITY): Payer: Self-pay

## 2014-07-02 ENCOUNTER — Encounter: Payer: Self-pay | Admitting: Neurology

## 2014-07-02 ENCOUNTER — Ambulatory Visit (INDEPENDENT_AMBULATORY_CARE_PROVIDER_SITE_OTHER): Payer: Medicare Other | Admitting: Neurology

## 2014-07-02 VITALS — BP 143/71 | HR 59 | Temp 96.9°F | Resp 14 | Ht 65.5 in | Wt 151.0 lb

## 2014-07-02 DIAGNOSIS — J4520 Mild intermittent asthma, uncomplicated: Secondary | ICD-10-CM

## 2014-07-02 DIAGNOSIS — G43801 Other migraine, not intractable, with status migrainosus: Secondary | ICD-10-CM | POA: Diagnosis not present

## 2014-07-02 DIAGNOSIS — G450 Vertebro-basilar artery syndrome: Secondary | ICD-10-CM

## 2014-07-02 DIAGNOSIS — J45909 Unspecified asthma, uncomplicated: Secondary | ICD-10-CM | POA: Insufficient documentation

## 2014-07-02 DIAGNOSIS — K2100 Gastro-esophageal reflux disease with esophagitis, without bleeding: Secondary | ICD-10-CM

## 2014-07-02 DIAGNOSIS — H8110 Benign paroxysmal vertigo, unspecified ear: Secondary | ICD-10-CM

## 2014-07-02 MED ORDER — TIZANIDINE HCL 4 MG PO TABS: 4.0000 mg | ORAL_TABLET | Freq: Four times a day (QID) | ORAL | Status: DC | PRN

## 2014-07-02 MED ORDER — MECLIZINE HCL 25 MG PO TABS: 25.0000 mg | ORAL_TABLET | Freq: Three times a day (TID) | ORAL | Status: DC | PRN

## 2014-07-02 MED ORDER — PANTOPRAZOLE SODIUM 40 MG PO TBEC: 40.0000 mg | DELAYED_RELEASE_TABLET | Freq: Two times a day (BID) | ORAL | Status: DC

## 2014-07-02 NOTE — Progress Notes (Signed)
Provider:  Larey Seat, M D  Referring Provider: Donnie Coffin, MD Primary Care Physician:  Donnie Coffin, MD  Chief Complaint  Patient presents with  . NP Dizziness, headaches    Rm 10, Husband, daughter, [name]    HPI:  [name] is a 73 y.o. female , who is seen here as a referral  from Dr. Alroy Dust for a dizziness evaluation;   Mrs. Richer reports that she had suffered from a headache characterized by a stiff neck for about a month - she had severe coughing spells at the onset , the HA remained for several weeks  before she became dizzy and vertiginous. She stated that she felt limited in her range of motion of the neck , and this was hindering her to turn her head while driving.  There was neck pain and occipital headaches with that maneuver, and the headaches lasted well over many hours.  These headaches arise from the neck and are associated with a pounding pulsation to the temples and forehead. . Now the headaches have remained for several days uninterrupted, waxing and waning. On the 19th of October she became nauseated, unable to stand without fear of falling and she felt completely disoriented to the room, as if floating . H she was nauseated and vomited.   This occurred after esting on the couch- there was no trigger- just suddenly the room seemed to move. The stance made it all worse. Her husband brought  her to the ED at [facility].   The patient underwent a CT angiogram which revealed a left vertebral artery dissection , chronic - and the patient was placed on 4 baby ASA daily .  BrainMRI was unremarkable , without contrast.  Vertigo stopped , but headaches persitsed.           Review of Systems: Out of a complete 14 system review, the patient complains of only the following symptoms, and all other reviewed systems are negative. Nausea, vomiting, wrenching, coughing, sore neck .  Eye pain, headaches worsening with activity, keeping her form sleep  and associated with photophobia. Not nausea.    History   Social History  . Marital Status: Married    Spouse Name: N/A    Number of Children: N/A  . Years of Education: N/A   Occupational History  . Not on file.   Social History Main Topics  . Smoking status: Never Smoker   . Smokeless tobacco: Not on file  . Alcohol Use: No  . Drug Use: No  . Sexual Activity: Not Currently   Other Topics Concern  . Not on file   Social History Narrative   Right handed, caffeine 2 cups daily, married, 4 kids, Retired.  [employer] Grad.     Family History  Problem Relation Age of Onset  . Heart attack Mother   . Heart disease Mother   . Hypertension Mother   . Heart attack Father   . Heart disease Father   . Hypertension Sister   . Heart attack Sister   . Hypertension Sister   . Heart attack Sister     Past Medical History  Diagnosis Date  . Mild aortic insufficiency 03/28/2006    echo  . H/O chest pain 03/24/2006    normal nuclear  study  . Sinus bradycardia on ECG   . Cancer     right breast mastectomy  . DDD (degenerative disc disease), cervical   . Normal cardiac stress test 2005  .  Asthma   . PONV (postoperative nausea and vomiting)   . Hypertension     Past Surgical History  Procedure Laterality Date  . Mastectomy  1993    right breast  . Other surgical history      hysterectomy  . Rectal fissure    . Childbirth      x 2  . Tubal ligation    . Appendectomy      "30 years ago"  . Cholecystectomy      "15 years ago"  . Esophagogastroduodenoscopy N/A 03/04/2014    Procedure: ESOPHAGOGASTRODUODENOSCOPY (EGD);  Surgeon: [name], MD;  Location: [facility] ENDOSCOPY;  Service: Endoscopy;  Laterality: N/A;    Current Outpatient Prescriptions  Medication Sig Dispense Refill  . amLODipine (NORVASC) 5 MG tablet Take 5 mg by mouth daily.      Marland Kitchen aspirin 81 MG chewable tablet Chew 4 tablets (324 mg total) by mouth daily.  30 tablet  0  . loratadine (CLARITIN) 10 MG tablet  Take 10 mg by mouth daily.      . meclizine (ANTIVERT) 25 MG tablet Take 1 tablet (25 mg total) by mouth 3 (three) times daily as needed for dizziness.  20 tablet  0  . montelukast (SINGULAIR) 10 MG tablet Take 10 mg by mouth at bedtime.      . pantoprazole (PROTONIX) 40 MG tablet Take 1 tablet (40 mg total) by mouth 2 (two) times daily before a meal. Twice a day for 4 weeks, then daily  60 tablet  3  . albuterol (PROAIR HFA) 108 (90 BASE) MCG/ACT inhaler Inhale 2 puffs into the lungs every 6 (six) hours as needed for wheezing or shortness of breath.      . Fluticasone-Salmeterol (ADVAIR) 250-50 MCG/DOSE AEPB Inhale 1 puff into the lungs daily as needed (for cough).       No current facility-administered medications for this visit.    Allergies as of 07/02/2014 - Review Complete 07/02/2014  Allergen Reaction Noted  . Doxycycline  11/28/2007  . Percocet [oxycodone-acetaminophen] Nausea And Vomiting 03/02/2014    Vitals: BP 143/71  Pulse 59  Temp(Src) 96.9 F (36.1 C) (Oral)  Resp 14  Ht 5' 5.5" (1.664 m)  Wt 151 lb (68.493 kg)  BMI 24.74 kg/m2 Last Weight:  Wt Readings from Last 1 Encounters:  07/02/14 151 lb (68.493 kg)   Last Height:   Ht Readings from Last 1 Encounters:  07/02/14 5' 5.5" (1.664 m)    Physical exam:  General: The patient is awake, alert and appears not in acute distress. The patient is well groomed. Head: Normocephalic, atraumatic. Neck is supple. Mallampati 3 , uvula lower on the left , neck circumference: 14 inches Cardiovascular:  Regular rate and rhythm, without  murmurs or carotid bruit, and without distended neck veins. Respiratory: Lungs are clear to auscultation. Skin:  Without evidence of edema, or rash Trunk: BMI is elevated and patient  has normal posture.  Neurologic exam : The patient is awake and alert, oriented to place and time.  Memory subjective  described as intact.  There is a normal attention span & concentration ability. Speech is  fluent without  dysarthria, dysphonia or aphasia. Mood and affect are appropriate.  Cranial nerves: Pupils are equal and briskly reactive to light. Funduscopic exam without  evidence of pallor or edema. Extraocular movements  in vertical and horizontal planes intact. There was nystagmus to the right , and left - not up or down, and a coarse saccade  correcting the eye movements when turning from left to right.  Visual fields by finger perimetry are restricted in upper peripheral quadrant to the right.  Hearing to finger rub intact.  Rinne weber non lateralizing.  Facial sensation intact to fine touch. Facial motor strength is symmetric and tongue  midline.  Tongue protrusion into either cheek is normal. Shoulder shrug is normal.   Motor exam:  Normal tone, muscle bulk and symmetric  strength in all extremities. Grip intact.   Sensory:  Fine touch, pinprick and vibration were tested in all extremities.  Proprioception was normal.  Coordination: Rapid alternating movements in the fingers/hands were normal. Finger-to-nose maneuver  normal without evidence of ataxia, missed the nose by 1/2 inch dysmetria , no tremor.  Gait and station: Patient walks without assistive device and is able unassisted to climb up to the exam table.  Strength within normal limits. Stance is stable and normal. Tandem gait is unfragmented. Romberg testing is negative .  Deep tendon reflexes: in the  upper and lower extremities are symmetric and intact. Babinski maneuver response is  downgoing.   Assessment:  After physical and neurologic examination, review of laboratory studies, imaging, neurophysiology testing and pre-existing records, assessment is that of :  Chronic vertebral dissection suggested by CT angiogram, left vertebral artery only.  She has had a severe single spell of vertigo with nausea.  She had 4 weeks of headaches prior to the vertigo. This is still unexplained. The photophobia signifies a migraine  .     Plan:  Treatment plan and additional workup :  TCD to evaluate the basilar flow. Vertebral artery flow . ASA to continue.  Migraine headaches with Bickerstaff symptomatology?  No triptan to be used.      Asencion Partridge Nyheim Seufert MD 07/02/2014

## 2014-07-02 NOTE — Patient Instructions (Addendum)
Bickerstaff's Syndrome (Basilar Migraine) CAUSES  When migraine affects the circulation in back of the brain or neck, it can cause basilar migraine or Bickerstaff's syndrome.  SYMPTOMS  It occurs most frequently in young women. Symptoms include:  Dizziness.  Double vision.  Loss of balance.  Confusion.  Slurred speech.  Fainting.  Disorientation.  During the severe (acute) headache, some people lose consciousness. Often these patients are mistakenly thought to be intoxicated, under the influence of drugs, or suffering from other conditions. A previous history of migraine is helpful in making the diagnosis.  TREATMENT  Basilar migraines are treated medically the same as all other migraines. HOME CARE INSTRUCTIONS   If this is your first diagnosed migraine headache, you may simply choose to wait and watch. You can wait to see if you have another headache before deciding on a further treatment.  You may consult your caregiver or do as suggested by the current treating caregiver.  Numerous medications can prevent these headaches if they are recurrent or should they become recurrent. Your caregiver can help you with a medication or treatment program that will be helpful to you.  If this has been a chronic (long-standing) condition, using continuous narcotics is not recommended. Using long-term narcotics can cause recurrent migraines. Narcotics are a temporary measure only. They are used for the infrequent migraine that fails to respond to all other measures. SEEK IMMEDIATE MEDICAL CARE IF:   You do not get relief from the medications given to you or you have a recurrence of pain.  You have an unexplained oral temperature above 102 F (38.9 C), or as your caregiver suggests.  You have a stiff neck.  You have loss of vision.  You have muscular weakness.  You have loss of muscular control.  You develop severe symptoms different from your first symptoms.  You start losing  your balance or have trouble walking.  You feel faint or pass out. MAKE SURE YOU:   Understand these instructions.  Will watch your condition.  Will get help right away if you are not doing well or get worse. Document Released: 08/29/2005 Document Revised: 11/21/2011 Document Reviewed: 04/16/2008 [facility] Patient Information 2015 [place], [place]. This information is not intended to replace advice given to you by your health care provider. Make sure you discuss any questions you have with your health care provider. Vertigo Vertigo means you feel like you or your surroundings are moving when they are not. Vertigo can be dangerous if it occurs when you are at work, driving, or performing difficult activities.  CAUSES  Vertigo occurs when there is a conflict of signals sent to your brain from the visual and sensory systems in your body. There are many different causes of vertigo, including:  Infections, especially in the inner ear.  A bad reaction to a drug or misuse of alcohol and medicines.  Withdrawal from drugs or alcohol.  Rapidly changing positions, such as lying down or rolling over in bed.  A migraine headache.  Decreased blood flow to the brain.  Increased pressure in the brain from a head injury, infection, tumor, or bleeding. SYMPTOMS  You may feel as though the world is spinning around or you are falling to the ground. Because your balance is upset, vertigo can cause nausea and vomiting. You may have involuntary eye movements (nystagmus). DIAGNOSIS  Vertigo is usually diagnosed by physical exam. If the cause of your vertigo is unknown, your caregiver may perform imaging tests, such as an MRI scan (magnetic  resonance imaging). TREATMENT  Most cases of vertigo resolve on their own, without treatment. Depending on the cause, your caregiver may prescribe certain medicines. If your vertigo is related to body position issues, your caregiver may recommend movements or procedures  to correct the problem. In rare cases, if your vertigo is caused by certain inner ear problems, you may need surgery. HOME CARE INSTRUCTIONS   Follow your caregiver's instructions.  Avoid driving.  Avoid operating heavy machinery.  Avoid performing any tasks that would be dangerous to you or others during a vertigo episode.  Tell your caregiver if you notice that certain medicines seem to be causing your vertigo. Some of the medicines used to treat vertigo episodes can actually make them worse in some people. SEEK IMMEDIATE MEDICAL CARE IF:   Your medicines do not relieve your vertigo or are making it worse.  You develop problems with talking, walking, weakness, or using your arms, hands, or legs.  You develop severe headaches.  Your nausea or vomiting continues or gets worse.  You develop visual changes.  A family member notices behavioral changes.  Your condition gets worse. MAKE SURE YOU:  Understand these instructions.  Will watch your condition.  Will get help right away if you are not doing well or get worse. Document Released: 06/08/2005 Document Revised: 11/21/2011 Document Reviewed: 03/17/2011 [facility] Patient Information 2015 [place], [place]. This information is not intended to replace advice given to you by your health care provider. Make sure you discuss any questions you have with your health care provider.

## 2014-07-03 ENCOUNTER — Other Ambulatory Visit: Payer: Self-pay | Admitting: Neurology

## 2014-07-03 DIAGNOSIS — G450 Vertebro-basilar artery syndrome: Secondary | ICD-10-CM

## 2014-07-10 ENCOUNTER — Other Ambulatory Visit: Payer: Medicare Other

## 2014-07-24 ENCOUNTER — Ambulatory Visit (INDEPENDENT_AMBULATORY_CARE_PROVIDER_SITE_OTHER): Payer: Medicare Other

## 2014-07-24 DIAGNOSIS — G450 Vertebro-basilar artery syndrome: Secondary | ICD-10-CM

## 2014-07-31 ENCOUNTER — Encounter: Payer: Self-pay | Admitting: Adult Health

## 2014-07-31 ENCOUNTER — Ambulatory Visit (INDEPENDENT_AMBULATORY_CARE_PROVIDER_SITE_OTHER): Payer: Medicare Other | Admitting: Adult Health

## 2014-07-31 VITALS — BP 154/77 | HR 65 | Ht 65.5 in | Wt 153.0 lb

## 2014-07-31 DIAGNOSIS — I779 Disorder of arteries and arterioles, unspecified: Secondary | ICD-10-CM

## 2014-07-31 DIAGNOSIS — I739 Peripheral vascular disease, unspecified: Secondary | ICD-10-CM

## 2014-07-31 DIAGNOSIS — R51 Headache: Secondary | ICD-10-CM | POA: Diagnosis not present

## 2014-07-31 DIAGNOSIS — R519 Headache, unspecified: Secondary | ICD-10-CM

## 2014-07-31 MED ORDER — NORTRIPTYLINE HCL 10 MG PO CAPS: 10.0000 mg | ORAL_CAPSULE | Freq: Every day | ORAL | Status: DC

## 2014-07-31 MED ORDER — ASPIRIN EC 325 MG PO TBEC: 325.0000 mg | DELAYED_RELEASE_TABLET | Freq: Every day | ORAL | Status: DC

## 2014-07-31 NOTE — Progress Notes (Deleted)
PATIENT: [name] DOB: 1941/01/16  REASON FOR VISIT: follow up HISTORY FROM: patient  HISTORY OF PRESENT ILLNESS: [name] is a 73 year old female with a history of veterbral artery dissection, vertigo and migraines. She returns today for follow-up. She was started on 4 ASA by the ED for vertebral artery dissection. She continued to have migraines she was started on tizanidine. She reports that her headache daily and she will take tylenol. She will also have blurred vision with the headache. She states she is up twice a night to take tylenol. Denies photophobia and phonophobia. She denies any dizziness.    HISTORY 07/02/14 [facility]): [name] reports that she had suffered from a headache characterized by a stiff neck for about a month - she had severe coughing spells at the onset , the HA remained for several weeks  before she became dizzy and vertiginous. She stated that she felt limited in her range of motion of the neck , and this was hindering her to turn her head while driving.  There was neck pain and occipital headaches with that maneuver, and the headaches lasted well over many hours.  These headaches arise from the neck and are associated with a pounding pulsation to the temples and forehead. . Now the headaches have remained for several days uninterrupted, waxing and waning.On the 19th of October she became nauseated, unable to stand without fear of falling and she felt completely disoriented to the room, as if floating . H she was nauseated and vomited.  This occurred after esting on the couch- there was no trigger- just suddenly the room seemed to move. The stance made it all worse. Her husband brought  her to the ED at [facility]. The patient underwent a CT angiogram which revealed a left vertebral artery dissection , chronic - and the patient was placed on 4 baby ASA daily . BrainMRI was unremarkable , without contrast.  Vertigo stopped , but headaches persitsed.   REVIEW OF  SYSTEMS: Out of a complete 14 system review of symptoms, the patient complains only of the following symptoms, and all other reviewed systems are negative.  ALLERGIES: Allergies  Allergen Reactions  . Doxycycline     REACTION: GI upset  . Percocet [Oxycodone-Acetaminophen] Nausea And Vomiting    HOME MEDICATIONS: Outpatient Prescriptions Prior to Visit  Medication Sig Dispense Refill  . albuterol (PROAIR HFA) 108 (90 BASE) MCG/ACT inhaler Inhale 2 puffs into the lungs every 6 (six) hours as needed for wheezing or shortness of breath.    Marland Kitchen amLODipine (NORVASC) 5 MG tablet Take 5 mg by mouth daily.    Marland Kitchen aspirin 81 MG chewable tablet Chew 4 tablets (324 mg total) by mouth daily. 30 tablet 0  . Fluticasone-Salmeterol (ADVAIR) 250-50 MCG/DOSE AEPB Inhale 1 puff into the lungs daily as needed (for cough).    . loratadine (CLARITIN) 10 MG tablet Take 10 mg by mouth daily.    . meclizine (ANTIVERT) 25 MG tablet Take 1 tablet (25 mg total) by mouth 3 (three) times daily as needed for dizziness. 20 tablet 0  . montelukast (SINGULAIR) 10 MG tablet Take 10 mg by mouth at bedtime.    . pantoprazole (PROTONIX) 40 MG tablet Take 1 tablet (40 mg total) by mouth 2 (two) times daily before a meal. Twice a day for 4 weeks, then daily 60 tablet 3  . tiZANidine (ZANAFLEX) 4 MG tablet Take 1 tablet (4 mg total) by mouth every 6 (  six) hours as needed for muscle spasms. 30 tablet 1   No facility-administered medications prior to visit.    PAST MEDICAL HISTORY: Past Medical History  Diagnosis Date  . Mild aortic insufficiency 03/28/2006    echo  . H/O chest pain 03/24/2006    normal nuclear  study  . Sinus bradycardia on ECG   . Cancer     right breast mastectomy  . DDD (degenerative disc disease), cervical   . Normal cardiac stress test 2005  . Asthma   . PONV (postoperative nausea and vomiting)   . Hypertension     PAST SURGICAL HISTORY: Past Surgical History  Procedure Laterality Date  .  Mastectomy  1993    right breast  . Other surgical history      hysterectomy  . Rectal fissure    . Childbirth      x 2  . Tubal ligation    . Appendectomy      "30 years ago"  . Cholecystectomy      "15 years ago"  . Esophagogastroduodenoscopy N/A 03/04/2014    Procedure: ESOPHAGOGASTRODUODENOSCOPY (EGD);  Surgeon: [name], MD;  Location: [facility] ENDOSCOPY;  Service: Endoscopy;  Laterality: N/A;    FAMILY HISTORY: Family History  Problem Relation Age of Onset  . Heart attack Mother   . Heart disease Mother   . Hypertension Mother   . Heart attack Father   . Heart disease Father   . Hypertension Sister   . Heart attack Sister   . Hypertension Sister   . Heart attack Sister     SOCIAL HISTORY: History   Social History  . Marital Status: Married    Spouse Name: Tommy     Number of Children: 4  . Years of Education: 12+   Occupational History  . Not on file.   Social History Main Topics  . Smoking status: Never Smoker   . Smokeless tobacco: Never Used  . Alcohol Use: No  . Drug Use: No  . Sexual Activity: Not Currently   Other Topics Concern  . Not on file   Social History Narrative   Right handed, caffeine 2 cups daily, married, 4 kids, Retired.  [employer] Grad.       PHYSICAL EXAM  Filed Vitals:   07/31/14 0905  BP: 154/77  Pulse: 65  Height: 5' 5.5" (1.664 m)  Weight: 153 lb (69.4 kg)   Body mass index is 25.06 kg/(m^2).  Generalized: Well developed, in no acute distress  Head: normocephalic and atraumatic. Oropharynx benign  Neck: Supple, no carotid bruits  Cardiac: Regular rate rhythm, no murmur  Musculoskeletal: No deformity   Neurological examination  Mentation: Alert oriented to time, place, history taking. Follows all commands speech and language fluent Cranial nerve II-XII: Fundoscopic exam reveals sharp disc margins.Pupils were equal round reactive to light extraocular movements were full, visual field were full on confrontational test.  Facial sensation and strength were normal. hearing was intact to finger rubbing bilaterally. Uvula tongue midline. Head turning and shoulder shrug  were normal and symmetric.Tongue protrusion into cheek strength was normal. Motor: The motor testing reveals 5 over 5 strength of all 4 extremities. Good symmetric motor tone is noted throughout.  Sensory: Sensory testing is intact to pinprick, soft touch, vibration sensation, and position sense on all 4 extremities. No evidence of extinction is noted.  Coordination: Cerebellar testing reveals good finger-nose-finger and heel-to-shin bilaterally.  Gait and station: Gait is normal. Tandem gait is normal. Romberg is  negative. No drift is seen.  Reflexes: Deep tendon reflexes are symmetric and normal bilaterally. Toes are downgoing bilaterally.   DIAGNOSTIC DATA (LABS, IMAGING, TESTING) - I reviewed patient records, labs, notes, testing and imaging myself where available.  Lab Results  Component Value Date   WBC 6.0 06/30/2014   HGB 14.3 06/30/2014   HCT 42.0 06/30/2014   MCV 84.7 06/30/2014   PLT 200 06/30/2014      Component Value Date/Time   NA 140 06/30/2014 1810   K 3.9 06/30/2014 1810   CL 106 06/30/2014 1810   CO2 24 06/30/2014 1745   GLUCOSE 104* 06/30/2014 1810   BUN 21 06/30/2014 1810   CREATININE 1.00 06/30/2014 1810   CALCIUM 9.6 06/30/2014 1745   PROT 7.8 06/30/2014 1745   ALBUMIN 4.4 06/30/2014 1745   AST 19 06/30/2014 1745   ALT 17 06/30/2014 1745   ALKPHOS 86 06/30/2014 1745   BILITOT 0.4 06/30/2014 1745   GFRNONAA 55* 06/30/2014 1745   GFRAA 64* 06/30/2014 1745   Lab Results  Component Value Date   CHOL 169 03/03/2014   HDL 47 03/03/2014   LDLCALC 102* 03/03/2014   TRIG 99 03/03/2014   CHOLHDL 3.6 03/03/2014   No results found for: HGBA1C No results found for: VITAMINB12 No results found for: TSH    ASSESSMENT AND PLAN 73 y.o. year old female  has a past medical history of Mild aortic insufficiency  (03/28/2006); H/O chest pain (03/24/2006); Sinus bradycardia on ECG; Cancer; DDD (degenerative disc disease), cervical; Normal cardiac stress test (2005); Asthma; PONV (postoperative nausea and vomiting); and Hypertension. here with ***     Ward Givens, MSN, NP-C 07/31/2014, 9:17 AM [facility] [address], [place], [place] [address] [phone]  Note: This document was prepared with digital dictation and possible smart phrase technology. Any transcriptional errors that result from this process are unintentional.

## 2014-07-31 NOTE — Progress Notes (Signed)
I agree with the assessment and plan as directed by NP .The patient is known to me .   Careli Luzader, MD  

## 2014-07-31 NOTE — Progress Notes (Signed)
PATIENT: [name] DOB: Jun 30, 1941  REASON FOR VISIT: follow up HISTORY FROM: patient  HISTORY OF PRESENT ILLNESS: Ms. [name] is a 73 year old female with a history of veterbral artery dissection, vertigo and migraines. She returns today for follow-up. She was started on 4 ASA by the ED for vertebral artery dissection. She continued to have headaches. she was started on tizanidine. She reports that she has a headache daily and she will take tylenol with no relief. She states that her headaches seem to be coming for a stiff neck. She states that she was having discomfort turning her head to the right for 2 months prior to going to the ED. She will occasionally have blurred vision with the headache. She states she is up twice a night to take tylenol. Denies photophobia and phonophobia. She denies any dizziness. MRI of the brain was unremarkable. Angiogram showed chronic vertebral artery dissection. The patient had several questions regarding the dissection. I consulted with Dr. Brett Fairy and Dr. Erlinda Hong regarding this.  HISTORY 07/02/14 [facility]): [name] reports that she had suffered from a headache characterized by a stiff neck for about a month - she had severe coughing spells at the onset , the HA remained for several weeks  before she became dizzy and vertiginous. She stated that she felt limited in her range of motion of the neck , and this was hindering her to turn her head while driving.  There was neck pain and occipital headaches with that maneuver, and the headaches lasted well over many hours.  These headaches arise from the neck and are associated with a pounding pulsation to the temples and forehead. . Now the headaches have remained for several days uninterrupted, waxing and waning.On the 19th of October she became nauseated, unable to stand without fear of falling and she felt completely disoriented to the room, as if floating . H she was nauseated and vomited.  This occurred after esting on  the couch- there was no trigger- just suddenly the room seemed to move. The stance made it all worse. Her husband brought  her to the ED at [facility]. The patient underwent a CT angiogram which revealed a left vertebral artery dissection , chronic - and the patient was placed on 4 baby ASA daily . BrainMRI was unremarkable , without contrast.  Vertigo stopped , but headaches persitsed.   REVIEW OF SYSTEMS: Out of a complete 14 system review of symptoms, the patient complains only of the following symptoms, and all other reviewed systems are negative.  ALLERGIES: Allergies  Allergen Reactions  . Doxycycline     REACTION: GI upset  . Percocet [Oxycodone-Acetaminophen] Nausea And Vomiting    HOME MEDICATIONS: Outpatient Prescriptions Prior to Visit  Medication Sig Dispense Refill  . albuterol (PROAIR HFA) 108 (90 BASE) MCG/ACT inhaler Inhale 2 puffs into the lungs every 6 (six) hours as needed for wheezing or shortness of breath.    Marland Kitchen amLODipine (NORVASC) 5 MG tablet Take 5 mg by mouth daily.    Marland Kitchen aspirin 81 MG chewable tablet Chew 4 tablets (324 mg total) by mouth daily. 30 tablet 0  . Fluticasone-Salmeterol (ADVAIR) 250-50 MCG/DOSE AEPB Inhale 1 puff into the lungs daily as needed (for cough).    . loratadine (CLARITIN) 10 MG tablet Take 10 mg by mouth daily.    . meclizine (ANTIVERT) 25 MG tablet Take 1 tablet (25 mg total) by mouth 3 (three) times daily as needed for dizziness. [place]  tablet 0  . montelukast (SINGULAIR) 10 MG tablet Take 10 mg by mouth at bedtime.    . pantoprazole (PROTONIX) 40 MG tablet Take 1 tablet (40 mg total) by mouth 2 (two) times daily before a meal. Twice a day for 4 weeks, then daily 60 tablet 3  . tiZANidine (ZANAFLEX) 4 MG tablet Take 1 tablet (4 mg total) by mouth every 6 (six) hours as needed for muscle spasms. 30 tablet 1   No facility-administered medications prior to visit.    PAST MEDICAL HISTORY: Past Medical History  Diagnosis Date  . Mild aortic  insufficiency 03/28/2006    echo  . H/O chest pain 03/24/2006    normal nuclear  study  . Sinus bradycardia on ECG   . Cancer     right breast mastectomy  . DDD (degenerative disc disease), cervical   . Normal cardiac stress test 2005  . Asthma   . PONV (postoperative nausea and vomiting)   . Hypertension     PAST SURGICAL HISTORY: Past Surgical History  Procedure Laterality Date  . Mastectomy  1993    right breast  . Other surgical history      hysterectomy  . Rectal fissure    . Childbirth      x 2  . Tubal ligation    . Appendectomy      "30 years ago"  . Cholecystectomy      "15 years ago"  . Esophagogastroduodenoscopy N/A 03/04/2014    Procedure: ESOPHAGOGASTRODUODENOSCOPY (EGD);  Surgeon: [name], MD;  Location: [facility] ENDOSCOPY;  Service: Endoscopy;  Laterality: N/A;    FAMILY HISTORY: Family History  Problem Relation Age of Onset  . Heart attack Mother   . Heart disease Mother   . Hypertension Mother   . Heart attack Father   . Heart disease Father   . Hypertension Sister   . Heart attack Sister   . Hypertension Sister   . Heart attack Sister     SOCIAL HISTORY: History   Social History  . Marital Status: Married    Spouse Name: Tommy     Number of Children: 4  . Years of Education: 12+   Occupational History  . Not on file.   Social History Main Topics  . Smoking status: Never Smoker   . Smokeless tobacco: Never Used  . Alcohol Use: No  . Drug Use: No  . Sexual Activity: Not Currently   Other Topics Concern  . Not on file   Social History Narrative   Right handed, caffeine 2 cups daily, married, 4 kids, Retired.  [employer].       PHYSICAL EXAM  Filed Vitals:   07/31/14 0905  BP: 154/77  Pulse: 65  Height: 5' 5.5" (1.664 m)  Weight: 153 lb (69.4 kg)   Body mass index is 25.06 kg/(m^2).  Generalized: Well developed, in no acute distress   Neurological examination  Mentation: Alert oriented to time, place, history taking.  Follows all commands speech and language fluent Cranial nerve II-XII: Pupils were equal round reactive to light. Extraocular movements were full, visual field were full on confrontational test. Facial sensation and strength were normal. Uvula tongue midline. Head turning and shoulder shrug  were normal and symmetric. Discomfort when turning the head to the right. Motor: The motor testing reveals 5 over 5 strength of all 4 extremities. Good symmetric motor tone is noted throughout.  Sensory: Sensory testing is intact to soft touch on all 4 extremities. No  evidence of extinction is noted.  Coordination: Cerebellar testing reveals good finger-nose-finger and heel-to-shin bilaterally.  Gait and station: Gait is normal. Tandem gait is normal. Romberg is negative. No drift is seen.  Reflexes: Deep tendon reflexes are symmetric and normal bilaterally.   DIAGNOSTIC DATA (LABS, IMAGING, TESTING) - I reviewed patient records, labs, notes, testing and imaging myself where available.  Lab Results  Component Value Date   WBC 6.0 06/30/2014   HGB 14.3 06/30/2014   HCT 42.0 06/30/2014   MCV 84.7 06/30/2014   PLT 200 06/30/2014      Component Value Date/Time   NA 140 06/30/2014 1810   K 3.9 06/30/2014 1810   CL 106 06/30/2014 1810   CO2 24 06/30/2014 1745   GLUCOSE 104* 06/30/2014 1810   BUN 21 06/30/2014 1810   CREATININE 1.00 06/30/2014 1810   CALCIUM 9.6 06/30/2014 1745   PROT 7.8 06/30/2014 1745   ALBUMIN 4.4 06/30/2014 1745   AST 19 06/30/2014 1745   ALT 17 06/30/2014 1745   ALKPHOS 86 06/30/2014 1745   BILITOT 0.4 06/30/2014 1745   GFRNONAA 55* 06/30/2014 1745   GFRAA 64* 06/30/2014 1745   Lab Results  Component Value Date   CHOL 169 03/03/2014   HDL 47 03/03/2014   LDLCALC 102* 03/03/2014   TRIG 99 03/03/2014   CHOLHDL 3.6 03/03/2014      ASSESSMENT AND PLAN 73 y.o. year old female  has a past medical history of Mild aortic insufficiency (03/28/2006); H/O chest pain  (03/24/2006); Sinus bradycardia on ECG; Cancer; DDD (degenerative disc disease), cervical; Normal cardiac stress test (2005); Asthma; PONV (postoperative nausea and vomiting); and Hypertension. here with:  1. Headache 2. Chronic vertebral artery dissection  Patient reports that she is still having daily headaches. She believes this is stemming from her neck. She states that she's had ongoing neck discomfort for over 3 months. She states this started 2 months before going to the emergency room. The patient will stop the tizanidine. She will be started on nortriptyline 10 mg at bedtime. Patient had several questions about the vetebral artery dissection. I consulted with Dr. Brett Fairy who advised the patient to continue Aspirin. The Dissection will heal on it's on. I also consulted with Dr. Erlinda Hong and he agreed that the patient should take 325 mg of Aspirin daily. Continue with all stroke prevention measures. He recommended that an angiogram be rechecked in 3-6 months. I spoke in depth with the patient regarding these suggestions. She reports that her PCP is managing her BP and cholesterol. For now she will take nortriptyline and let us know if it is beneficial for her neck and headache pain. The patient will follow-up in 2 months or sooner if needed.     Ward Givens, MSN, NP-C 07/31/2014, 9:17 AM [facility] [address], [place], [place] [address] [phone]  Note: This document was prepared with digital dictation and possible smart phrase technology. Any transcriptional errors that result from this process are unintentional.

## 2014-08-05 ENCOUNTER — Telehealth: Payer: Self-pay | Admitting: Adult Health

## 2014-08-05 NOTE — Telephone Encounter (Signed)
Pt states she had a test done 2 weeks ago, a ultrasound and she wants to get her results and she would also like to get a copy of her results. She also would like for you to know that she is still having problems with her neck. Please call and advise.

## 2014-08-05 NOTE — Telephone Encounter (Signed)
[name] please call patient

## 2014-08-05 NOTE — Telephone Encounter (Signed)
Doctor,   This patient is asking about the [place] result and wants a copy.

## 2014-08-06 MED ORDER — NORTRIPTYLINE HCL 10 MG PO CAPS: 20.0000 mg | ORAL_CAPSULE | Freq: Every day | ORAL | Status: DC

## 2014-08-06 NOTE — Telephone Encounter (Signed)
Jinny Blossom, is the [place] not scanned into EPIC. Ask Towana Badger , CD

## 2014-08-06 NOTE — Telephone Encounter (Signed)
I called  The patient regarding her transcranial Doppler study. She does have some moderate stenosis in the right and left vertebral arteries. I consulted with Dr. Brett Fairy and at this time the patient should continue taking aspirin daily. The patient verbalized understanding. She also reports that she continues to have headaches. Located in the back of her head. She is also having discomfort when she turns her head to the right. She is currently taking nortriptyline 10 mg at bedtime. I will increase this to 20 mg at bedtime. The patient will let us know if she does not find this beneficial.

## 2014-08-11 DIAGNOSIS — R51 Headache: Secondary | ICD-10-CM | POA: Diagnosis not present

## 2014-08-11 DIAGNOSIS — F419 Anxiety disorder, unspecified: Secondary | ICD-10-CM | POA: Diagnosis not present

## 2014-08-11 DIAGNOSIS — Z23 Encounter for immunization: Secondary | ICD-10-CM | POA: Diagnosis not present

## 2014-08-11 DIAGNOSIS — I7774 Dissection of vertebral artery: Secondary | ICD-10-CM | POA: Diagnosis not present

## 2014-08-27 ENCOUNTER — Ambulatory Visit
Admission: RE | Admit: 2014-08-27 | Discharge: 2014-08-27 | Disposition: A | Discharge status: Home / Self Care | Payer: Medicare Other | Source: Ambulatory Visit

## 2014-08-27 DIAGNOSIS — Z1231 Encounter for screening mammogram for malignant neoplasm of breast: Secondary | ICD-10-CM

## 2014-08-27 DIAGNOSIS — Z9011 Acquired absence of right breast and nipple: Secondary | ICD-10-CM

## 2014-10-21 ENCOUNTER — Ambulatory Visit (INDEPENDENT_AMBULATORY_CARE_PROVIDER_SITE_OTHER): Payer: Medicare Other | Admitting: Adult Health

## 2014-10-21 ENCOUNTER — Encounter: Payer: Self-pay | Admitting: Adult Health

## 2014-10-21 VITALS — BP 156/71 | HR 56 | Ht 65.0 in | Wt 153.0 lb

## 2014-10-21 DIAGNOSIS — G43009 Migraine without aura, not intractable, without status migrainosus: Secondary | ICD-10-CM | POA: Diagnosis not present

## 2014-10-21 DIAGNOSIS — R42 Dizziness and giddiness: Secondary | ICD-10-CM | POA: Diagnosis not present

## 2014-10-21 DIAGNOSIS — I739 Peripheral vascular disease, unspecified: Principal | ICD-10-CM

## 2014-10-21 DIAGNOSIS — I779 Disorder of arteries and arterioles, unspecified: Secondary | ICD-10-CM | POA: Diagnosis not present

## 2014-10-21 MED ORDER — NORTRIPTYLINE HCL 10 MG PO CAPS: 20.0000 mg | ORAL_CAPSULE | Freq: Every day | ORAL | Status: DC

## 2014-10-21 NOTE — Patient Instructions (Signed)
Continue Aspirin 325 mg daily Continue Nortriptyline 20 mg at bedtime. We will repeat CT Angiogram of head and neck. You will be contacted to set up an appointment.  If your symptoms worsen or you develop new symptoms please let us know.

## 2014-10-21 NOTE — Progress Notes (Signed)
PATIENT: [name] DOB: [birth year]  REASON FOR VISIT: follow up- vertebral artery dissection, vertigo, migraines.  HISTORY FROM: patient  HISTORY OF PRESENT ILLNESS: [name] is a 74 year old female with a history of vertebral artery dissection, vertigo and migraines. She returns today for follow-up. The patient was started on Nortriptyline 20 mg at bedtime of neck pain and headaches. She reports that her neck pain and headache has resolved. She states that she may have a small headache every once in a while but that resolves with Tylenol.  Patient continues to take ASA 325 mg daily. She is no longer having dizziness. Overall she feels that she is doing much better this visit.   HISTORY 07/31/14: [name] is a 74 year old female with a history of vertebral artery dissection, vertigo and migraines. She returns today for follow-up. She was started on 4 ASA by the ED for vertebral artery dissection. She continued to have headaches. she was started on tizanidine. She reports that she has a headache daily and she will take tylenol with no relief. She states that her headaches seem to be coming for a stiff neck. She states that she was having discomfort turning her head to the right for 2 months prior to going to the ED. She will occasionally have blurred vision with the headache. She states she is up twice a night to take tylenol. Denies photophobia and phonophobia. She denies any dizziness. MRI of the brain was unremarkable. Angiogram showed chronic vertebral artery dissection. The patient had several questions regarding the dissection. I consulted with Dr. Brett Fairy and Dr. Erlinda Hong regarding this.  HISTORY 07/02/14 [facility]): [name] reports that she had suffered from a headache characterized by a stiff neck for about a month - she had severe coughing spells at the onset , the HA remained for several weeks  before she became dizzy and vertiginous. She stated that she felt limited in her range of motion of  the neck , and this was hindering her to turn her head while driving.  There was neck pain and occipital headaches with that maneuver, and the headaches lasted well over many hours.  These headaches arise from the neck and are associated with a pounding pulsation to the temples and forehead. . Now the headaches have remained for several days uninterrupted, waxing and waning.On the 19th of October she became nauseated, unable to stand without fear of falling and she felt completely disoriented to the room, as if floating . H she was nauseated and vomited.  This occurred after esting on the couch- there was no trigger- just suddenly the room seemed to move. The stance made it all worse. Her husband brought  her to the ED at [facility]. The patient underwent a CT angiogram which revealed a left vertebral artery dissection , chronic - and the patient was placed on 4 baby ASA daily . BrainMRI was unremarkable , without contrast.  Vertigo stopped , but headaches persisted.  REVIEW OF SYSTEMS: Out of a complete 14 system review of symptoms, the patient complains only of the following symptoms, and all other reviewed systems are negative. See HPI  ALLERGIES: Allergies  Allergen Reactions  . Doxycycline     REACTION: GI upset  . Percocet [Oxycodone-Acetaminophen] Nausea And Vomiting    HOME MEDICATIONS: Outpatient Prescriptions Prior to Visit  Medication Sig Dispense Refill  . amLODipine (NORVASC) 5 MG tablet Take 5 mg by mouth daily.    Marland Kitchen aspirin EC  325 MG tablet Take 1 tablet (325 mg total) by mouth daily. 30 tablet 0  . loratadine (CLARITIN) 10 MG tablet Take 10 mg by mouth daily.    . nortriptyline (PAMELOR) 10 MG capsule Take 2 capsules (20 mg total) by mouth at bedtime. 60 capsule 3  . pantoprazole (PROTONIX) 40 MG tablet Take 1 tablet (40 mg total) by mouth 2 (two) times daily before a meal. Twice a day for 4 weeks, then daily 60 tablet 3  . albuterol (PROAIR HFA) 108 (90 BASE) MCG/ACT inhaler  Inhale 2 puffs into the lungs every 6 (six) hours as needed for wheezing or shortness of breath.    . Fluticasone-Salmeterol (ADVAIR) 250-50 MCG/DOSE AEPB Inhale 1 puff into the lungs daily as needed (for cough).    . meclizine (ANTIVERT) 25 MG tablet Take 1 tablet (25 mg total) by mouth 3 (three) times daily as needed for dizziness. (Patient not taking: Reported on 10/21/2014) 20 tablet 0  . montelukast (SINGULAIR) 10 MG tablet Take 10 mg by mouth at bedtime.     No facility-administered medications prior to visit.    PAST MEDICAL HISTORY: Past Medical History  Diagnosis Date  . Mild aortic insufficiency 03/28/2006    echo  . H/O chest pain 03/24/2006    normal nuclear  study  . Sinus bradycardia on ECG   . Cancer     right breast mastectomy  . DDD (degenerative disc disease), cervical   . Normal cardiac stress test 2005  . Asthma   . PONV (postoperative nausea and vomiting)   . Hypertension     PAST SURGICAL HISTORY: Past Surgical History  Procedure Laterality Date  . Mastectomy  1993    right breast  . Other surgical history      hysterectomy  . Rectal fissure    . Childbirth      x 2  . Tubal ligation    . Appendectomy      "30 years ago"  . Cholecystectomy      "15 years ago"  . Esophagogastroduodenoscopy N/A 03/04/2014    Procedure: ESOPHAGOGASTRODUODENOSCOPY (EGD);  Surgeon: [name], MD;  Location: [facility] ENDOSCOPY;  Service: Endoscopy;  Laterality: N/A;    FAMILY HISTORY: Family History  Problem Relation Age of Onset  . Heart attack Mother   . Heart disease Mother   . Hypertension Mother   . Heart attack Father   . Heart disease Father   . Hypertension Sister   . Heart attack Sister   . Hypertension Sister   . Heart attack Sister      PHYSICAL EXAM  Filed Vitals:   10/21/14 0819  BP: 156/71  Pulse: 56  Height: 5\' 5"  (1.651 m)  Weight: 153 lb (69.4 kg)   Body mass index is 25.46 kg/(m^2).  Generalized: Well developed, in no acute distress    Neurological examination  Mentation: Alert oriented to time, place, history taking. Follows all commands speech and language fluent Cranial nerve II-XII: Pupils were equal round reactive to light. Extraocular movements were full, visual field were full on confrontational test. Facial sensation and strength were normal. Uvula tongue midline. Head turning and shoulder shrug  were normal and symmetric. Motor: The motor testing reveals 5 over 5 strength of all 4 extremities. Good symmetric motor tone is noted throughout.  Sensory: Sensory testing is intact to soft touch on all 4 extremities. No evidence of extinction is noted.  Coordination: Cerebellar testing reveals good finger-nose-finger and heel-to-shin bilaterally.  Gait and station: Gait is normal. Tandem gait is normal. Romberg is negative. No drift is seen.  Reflexes: Deep tendon reflexes are symmetric and normal bilaterally.     DIAGNOSTIC DATA (LABS, IMAGING, TESTING) - I reviewed patient records, labs, notes, testing and imaging myself where available.  Lab Results  Component Value Date   WBC 6.0 06/30/2014   HGB 14.3 06/30/2014   HCT 42.0 06/30/2014   MCV 84.7 06/30/2014   PLT 200 06/30/2014      Component Value Date/Time   NA 140 06/30/2014 1810   K 3.9 06/30/2014 1810   CL 106 06/30/2014 1810   CO2 24 06/30/2014 1745   GLUCOSE 104* 06/30/2014 1810   BUN 21 06/30/2014 1810   CREATININE 1.00 06/30/2014 1810   CALCIUM 9.6 06/30/2014 1745   PROT 7.8 06/30/2014 1745   ALBUMIN 4.4 06/30/2014 1745   AST 19 06/30/2014 1745   ALT 17 06/30/2014 1745   ALKPHOS 86 06/30/2014 1745   BILITOT 0.4 06/30/2014 1745   GFRNONAA 55* 06/30/2014 1745   GFRAA 64* 06/30/2014 1745   Lab Results  Component Value Date   CHOL 169 03/03/2014   HDL 47 03/03/2014   LDLCALC 102* 03/03/2014   TRIG 99 03/03/2014   CHOLHDL 3.6 03/03/2014      ASSESSMENT AND PLAN 74 y.o. year old female  has a past medical history of Mild aortic  insufficiency (03/28/2006); H/O chest pain (03/24/2006); Sinus bradycardia on ECG; Cancer; DDD (degenerative disc disease), cervical; Normal cardiac stress test (2005); Asthma; PONV (postoperative nausea and vomiting); and Hypertension. here with:  1. Vertebral artery disease 2. Migraines 3. Vertigo  Overall the patient is doing better. She states she is no longer having the  Migraines and vertigo. Patient will continue Aspirin and nortriptyline. We will also repeat CT angio of head and neck to reevaluation the vertebral artery dissection. If patient symptoms worsen or she develops new symptoms she should let us know. F/U in 6 months or sooner if needed.     Ward Givens, MSN, NP-C 10/21/2014, 8:25 AM [facility] [address], [place],  [address] [phone]  Note: This document was prepared with digital dictation and possible smart phrase technology. Any transcriptional errors that result from this process are unintentional.

## 2014-10-21 NOTE — Progress Notes (Signed)
I agree with the assessment and plan as directed by NP .The patient is known to me .   Davida Falconi, MD  

## 2014-10-25 ENCOUNTER — Other Ambulatory Visit: Payer: Self-pay | Admitting: Neurology

## 2014-11-19 ENCOUNTER — Other Ambulatory Visit: Payer: Self-pay | Admitting: Adult Health

## 2014-11-19 DIAGNOSIS — M489 Spondylopathy, unspecified: Secondary | ICD-10-CM | POA: Diagnosis not present

## 2014-11-19 LAB — CREATININE, SERUM: Creat: 0.9 mg/dL (ref 0.50–1.10)

## 2014-11-19 LAB — BUN: BUN: 13 mg/dL (ref 6–23)

## 2014-11-21 ENCOUNTER — Ambulatory Visit
Admission: RE | Admit: 2014-11-21 | Discharge: 2014-11-21 | Disposition: A | Discharge status: Home / Self Care | Payer: Medicare Other | Source: Ambulatory Visit | Attending: Adult Health | Admitting: Adult Health

## 2014-11-21 ENCOUNTER — Telehealth: Payer: Self-pay | Admitting: Adult Health

## 2014-11-21 DIAGNOSIS — I739 Peripheral vascular disease, unspecified: Principal | ICD-10-CM

## 2014-11-21 DIAGNOSIS — I779 Disorder of arteries and arterioles, unspecified: Secondary | ICD-10-CM

## 2014-11-21 DIAGNOSIS — I6523 Occlusion and stenosis of bilateral carotid arteries: Secondary | ICD-10-CM | POA: Diagnosis not present

## 2014-11-21 MED ORDER — CLOPIDOGREL BISULFATE 75 MG PO TABS: 75.0000 mg | ORAL_TABLET | Freq: Every day | ORAL | Status: DC

## 2014-11-21 MED ORDER — ASPIRIN EC 81 MG PO TBEC: 81.0000 mg | DELAYED_RELEASE_TABLET | Freq: Every day | ORAL | Status: DC

## 2014-11-21 MED ORDER — IOPAMIDOL (ISOVUE-370) INJECTION 76%
75.0000 mL | Freq: Once | INTRAVENOUS | Status: AC | PRN
  Administered 2014-11-21: 75 mL via INTRAVENOUS

## 2014-11-21 NOTE — Telephone Encounter (Signed)
I called the patient. Her angiogram of the head and neck remained unchanged. However there was a new finding of severe distal M1/proximal into left MCA stenosis. I consulted with Dr.Xu and he recommended did start the patient on aspirin 81 mg in conjunction with Plavix 75 mg daily. I have called the patient and reviewed the results as well as the new medication. She verbalized understanding. I also consulted with Dr. Brett Fairy and she agrees with this plan.

## 2014-12-01 DIAGNOSIS — H04129 Dry eye syndrome of unspecified lacrimal gland: Secondary | ICD-10-CM | POA: Diagnosis not present

## 2014-12-02 DIAGNOSIS — I10 Essential (primary) hypertension: Secondary | ICD-10-CM | POA: Diagnosis not present

## 2014-12-02 DIAGNOSIS — K210 Gastro-esophageal reflux disease with esophagitis: Secondary | ICD-10-CM | POA: Diagnosis not present

## 2014-12-02 DIAGNOSIS — R42 Dizziness and giddiness: Secondary | ICD-10-CM | POA: Diagnosis not present

## 2014-12-02 DIAGNOSIS — R51 Headache: Secondary | ICD-10-CM | POA: Diagnosis not present

## 2014-12-02 DIAGNOSIS — I7774 Dissection of vertebral artery: Secondary | ICD-10-CM | POA: Diagnosis not present

## 2014-12-26 DIAGNOSIS — Z780 Asymptomatic menopausal state: Secondary | ICD-10-CM | POA: Diagnosis not present

## 2014-12-26 DIAGNOSIS — K210 Gastro-esophageal reflux disease with esophagitis: Secondary | ICD-10-CM | POA: Diagnosis not present

## 2014-12-26 DIAGNOSIS — I10 Essential (primary) hypertension: Secondary | ICD-10-CM | POA: Diagnosis not present

## 2014-12-26 DIAGNOSIS — J309 Allergic rhinitis, unspecified: Secondary | ICD-10-CM | POA: Diagnosis not present

## 2014-12-26 DIAGNOSIS — Z0000 Encounter for general adult medical examination without abnormal findings: Secondary | ICD-10-CM | POA: Diagnosis not present

## 2014-12-26 DIAGNOSIS — F411 Generalized anxiety disorder: Secondary | ICD-10-CM | POA: Diagnosis not present

## 2014-12-26 DIAGNOSIS — I7774 Dissection of vertebral artery: Secondary | ICD-10-CM | POA: Diagnosis not present

## 2015-03-11 DIAGNOSIS — H04123 Dry eye syndrome of bilateral lacrimal glands: Secondary | ICD-10-CM | POA: Diagnosis not present

## 2015-03-11 DIAGNOSIS — H1033 Unspecified acute conjunctivitis, bilateral: Secondary | ICD-10-CM | POA: Diagnosis not present

## 2015-04-02 DIAGNOSIS — H1033 Unspecified acute conjunctivitis, bilateral: Secondary | ICD-10-CM | POA: Diagnosis not present

## 2015-04-15 DIAGNOSIS — M545 Low back pain: Secondary | ICD-10-CM | POA: Diagnosis not present

## 2015-04-21 ENCOUNTER — Ambulatory Visit: Payer: Medicare Other | Admitting: Adult Health

## 2015-04-22 ENCOUNTER — Ambulatory Visit: Payer: Medicare Other | Admitting: Adult Health

## 2015-04-23 ENCOUNTER — Ambulatory Visit (INDEPENDENT_AMBULATORY_CARE_PROVIDER_SITE_OTHER): Payer: Medicare Other | Admitting: Adult Health

## 2015-04-23 ENCOUNTER — Encounter: Payer: Self-pay | Admitting: Adult Health

## 2015-04-23 VITALS — BP 166/79 | HR 60 | Ht 65.0 in | Wt 153.0 lb

## 2015-04-23 DIAGNOSIS — R51 Headache: Secondary | ICD-10-CM

## 2015-04-23 DIAGNOSIS — I7774 Dissection of vertebral artery: Secondary | ICD-10-CM | POA: Diagnosis not present

## 2015-04-23 DIAGNOSIS — I6602 Occlusion and stenosis of left middle cerebral artery: Secondary | ICD-10-CM

## 2015-04-23 DIAGNOSIS — R519 Headache, unspecified: Secondary | ICD-10-CM

## 2015-04-23 DIAGNOSIS — R42 Dizziness and giddiness: Secondary | ICD-10-CM

## 2015-04-23 NOTE — Progress Notes (Signed)
I agree with the assessment and plan as directed by NP .The patient is known to me .   Giannamarie Paulus, MD  

## 2015-04-23 NOTE — Progress Notes (Signed)
PATIENT: [name] DOB: 05-Nov-1940  REASON FOR VISIT: follow up- vertebral artery dissection, vertigo, migraines HISTORY FROM: patient  HISTORY OF PRESENT ILLNESS: [name] is a 74 year old female with a history of vertebral artery dissection, vertigo and migraines. She returns today for follow-up. The patient continues to take nortriptyline 20 mg at bedtime. She reports that her neck pain and headaches have resolved. She denies having any additional episodes of dizziness. She is currently on aspirin and Plavix.  She reports that since starting Plavix she has had additional bruising.  She states that she did stop aspirin for short period of time to see if it was beneficial. She reports that she continued to have bruising. She denies any strokelike symptoms. She returns today for an evaluation.  HISTORY 10/21/14:  [name] is a 74 year old female with a history of vertebral artery dissection, vertigo and migraines. She returns today for follow-up. The patient was started on Nortriptyline 20 mg at bedtime of neck pain and headaches. She reports that her neck pain and headache has resolved. She states that she may have a small headache every once in a while but that resolves with Tylenol. Patient continues to take ASA 325 mg daily. She is no longer having dizziness. Overall she feels that she is doing much better this visit.   REVIEW OF SYSTEMS: Out of a complete 14 system review of symptoms, the patient complains only of the following symptoms, and all other reviewed systems are negative.  ALLERGIES: Allergies  Allergen Reactions  . Doxycycline     REACTION: GI upset  . Percocet [Oxycodone-Acetaminophen] Nausea And Vomiting    HOME MEDICATIONS: Outpatient Prescriptions Prior to Visit  Medication Sig Dispense Refill  . albuterol (PROAIR HFA) 108 (90 BASE) MCG/ACT inhaler Inhale 2 puffs into the lungs every 6 (six) hours as needed for wheezing or shortness of breath.    Marland Kitchen amLODipine  (NORVASC) 5 MG tablet Take 5 mg by mouth daily.    Marland Kitchen aspirin EC 81 MG tablet Take 1 tablet (81 mg total) by mouth daily. 30 tablet 6  . clopidogrel (PLAVIX) 75 MG tablet Take 1 tablet (75 mg total) by mouth daily. 30 tablet 6  . Fluticasone-Salmeterol (ADVAIR) 250-50 MCG/DOSE AEPB Inhale 1 puff into the lungs daily as needed (for cough).    . loratadine (CLARITIN) 10 MG tablet Take 10 mg by mouth daily.    . meclizine (ANTIVERT) 25 MG tablet TAKE 1 TABLET BY MOUTH 3 TIMES A DAY AS NEEDED 30 tablet 3  . montelukast (SINGULAIR) 10 MG tablet Take 10 mg by mouth at bedtime.    . nortriptyline (PAMELOR) 10 MG capsule Take 2 capsules (20 mg total) by mouth at bedtime. 60 capsule 5  . pantoprazole (PROTONIX) 40 MG tablet Take 1 tablet (40 mg total) by mouth 2 (two) times daily before a meal. Twice a day for 4 weeks, then daily 60 tablet 3   No facility-administered medications prior to visit.    PAST MEDICAL HISTORY: Past Medical History  Diagnosis Date  . Mild aortic insufficiency 03/28/2006    echo  . H/O chest pain 03/24/2006    normal nuclear  study  . Sinus bradycardia on ECG   . Cancer     right breast mastectomy  . DDD (degenerative disc disease), cervical   . Normal cardiac stress test 2005  . Asthma   . PONV (postoperative nausea and vomiting)   . Hypertension     PAST  SURGICAL HISTORY: Past Surgical History  Procedure Laterality Date  . Mastectomy  1993    right breast  . Other surgical history      hysterectomy  . Rectal fissure    . Childbirth      x 2  . Tubal ligation    . Appendectomy      "30 years ago"  . Cholecystectomy      "15 years ago"  . Esophagogastroduodenoscopy N/A 03/04/2014    Procedure: ESOPHAGOGASTRODUODENOSCOPY (EGD);  Surgeon: [name], MD;  Location: [facility] ENDOSCOPY;  Service: Endoscopy;  Laterality: N/A;    FAMILY HISTORY: Family History  Problem Relation Age of Onset  . Heart attack Mother   . Heart disease Mother   . Hypertension  Mother   . Heart attack Father   . Heart disease Father   . Hypertension Sister   . Heart attack Sister   . Hypertension Sister   . Heart attack Sister     SOCIAL HISTORY: Social History   Social History  . Marital Status: Married    Spouse Name: [name]   . Number of Children: 4  . Years of Education: 12+   Occupational History  . Not on file.   Social History Main Topics  . Smoking status: Never Smoker   . Smokeless tobacco: Never Used  . Alcohol Use: No  . Drug Use: No  . Sexual Activity: Not Currently   Other Topics Concern  . Not on file   Social History Narrative   Right handed, caffeine 2 cups daily, married, 4 kids, Retired.  [employer].       PHYSICAL EXAM  Filed Vitals:   04/23/15 0750  BP: 166/79  Pulse: 60  Height: 5\' 5"  (1.651 m)  Weight: 153 lb (69.4 kg)   Body mass index is 25.46 kg/(m^2).  Generalized: Well developed, in no acute distress   Neurological examination  Mentation: Alert oriented to time, place, history taking. Follows all commands speech and language fluent Cranial nerve II-XII: Pupils were equal round reactive to light. Extraocular movements were full, visual field were full on confrontational test. Facial sensation and strength were normal. Uvula tongue midline. Head turning and shoulder shrug  were normal and symmetric. Motor: The motor testing reveals 5 over 5 strength of all 4 extremities. Good symmetric motor tone is noted throughout.  Sensory: Sensory testing is intact to soft touch on all 4 extremities. No evidence of extinction is noted.  Coordination: Cerebellar testing reveals good finger-nose-finger and heel-to-shin bilaterally.  Gait and station: Gait is normal. Tandem gait is normal. Romberg is negative. No drift is seen.  Reflexes: Deep tendon reflexes are symmetric and normal bilaterally.   DIAGNOSTIC DATA (LABS, IMAGING, TESTING) - I reviewed patient records, labs, notes, testing and imaging myself where  available.   CT Angio head and neck 11/21/14:  1. Unchanged irregularity and mild narrowing of the distal left vertebral artery in the neck, which could reflect a chronic dissection or sequelae of atherosclerosis. 2. Unchanged narrowing of the proximal vertebral arteries, mild on the right and moderate on the left. 3. 45% proximal right ICA stenosis, unchanged. 4. Mild to moderate intracranial right vertebral artery stenosis, unchanged. 5. 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 6. New, severe distal M1/proximal M2 left MCA stenosis.   ASSESSMENT AND PLAN 74 y.o. year old female  has a past medical history of Mild aortic insufficiency (03/28/2006); H/O chest pain (03/24/2006);  Sinus bradycardia on ECG; Cancer; DDD (degenerative disc disease), cervical; Normal cardiac stress test (2005); Asthma; PONV (postoperative nausea and vomiting); and Hypertension. here with:  1. Vertebral artery dissection 2. Headache 3. Vertigo  Overall the patient is doing well. She is no longer having headaches or dizziness. She will continue on nortriptyline 20 mg at bedtime.  Patient is questioning if she can stop Plavix and aspirin. I have explained that her last angiogram showed that she had severe stenosis in the left MCA.  I consulted with Dr. Erlinda Hong  And he recommended that the  Patient will get the best benefit being on both of these medications considering the stenosis. However if she has significant bruising we can consider one  medication. At this time the patient has opted to remain on both medications. Patient's blood pressure is elevated today. I advised her to follow-up with her primary care provider provider. I explained that keeping good control of her blood pressure helps minimize her risk for a stroke.  Patient recently had her cholesterol levels checked and she states they were in normal range. Advised the patient that we  will consider repeating the angiogram at the next visit. She will follow-up in 6 months with Dr. Mechele Claude, MSN, NP-C 04/23/2015, 7:46 AM [facility] [address] [place], [place] [address] [phone]

## 2015-04-23 NOTE — Patient Instructions (Signed)
Stop Plavix, Take Aspirin 325 mg daily.  I will consult with Dr. Erlinda Hong and will call you with any changes to our plan.

## 2015-04-27 ENCOUNTER — Telehealth: Payer: Self-pay | Admitting: Neurology

## 2015-04-27 ENCOUNTER — Inpatient Hospital Stay (HOSPITAL_COMMUNITY): Payer: Medicare Other

## 2015-04-27 ENCOUNTER — Inpatient Hospital Stay (HOSPITAL_COMMUNITY)
Admission: EM | Admit: 2015-04-27 | Discharge: 2015-04-28 | DRG: 064 | Disposition: A | Discharge status: Home / Self Care | Payer: Medicare Other | Attending: Internal Medicine | Admitting: Internal Medicine

## 2015-04-27 ENCOUNTER — Ambulatory Visit: Payer: Self-pay | Admitting: Adult Health

## 2015-04-27 ENCOUNTER — Encounter (HOSPITAL_COMMUNITY): Payer: Self-pay | Admitting: *Deleted

## 2015-04-27 ENCOUNTER — Emergency Department (HOSPITAL_COMMUNITY): Payer: Medicare Other

## 2015-04-27 DIAGNOSIS — G43909 Migraine, unspecified, not intractable, without status migrainosus: Secondary | ICD-10-CM | POA: Diagnosis present

## 2015-04-27 DIAGNOSIS — Z9049 Acquired absence of other specified parts of digestive tract: Secondary | ICD-10-CM | POA: Diagnosis present

## 2015-04-27 DIAGNOSIS — Z7902 Long term (current) use of antithrombotics/antiplatelets: Secondary | ICD-10-CM | POA: Diagnosis not present

## 2015-04-27 DIAGNOSIS — E785 Hyperlipidemia, unspecified: Secondary | ICD-10-CM | POA: Diagnosis not present

## 2015-04-27 DIAGNOSIS — I10 Essential (primary) hypertension: Secondary | ICD-10-CM | POA: Diagnosis not present

## 2015-04-27 DIAGNOSIS — Z9011 Acquired absence of right breast and nipple: Secondary | ICD-10-CM | POA: Diagnosis present

## 2015-04-27 DIAGNOSIS — Z7982 Long term (current) use of aspirin: Secondary | ICD-10-CM

## 2015-04-27 DIAGNOSIS — I651 Occlusion and stenosis of basilar artery: Secondary | ICD-10-CM | POA: Diagnosis present

## 2015-04-27 DIAGNOSIS — Z8249 Family history of ischemic heart disease and other diseases of the circulatory system: Secondary | ICD-10-CM | POA: Diagnosis not present

## 2015-04-27 DIAGNOSIS — R4701 Aphasia: Secondary | ICD-10-CM | POA: Diagnosis present

## 2015-04-27 DIAGNOSIS — Z79899 Other long term (current) drug therapy: Secondary | ICD-10-CM | POA: Diagnosis not present

## 2015-04-27 DIAGNOSIS — I6789 Other cerebrovascular disease: Secondary | ICD-10-CM | POA: Diagnosis not present

## 2015-04-27 DIAGNOSIS — Z90710 Acquired absence of both cervix and uterus: Secondary | ICD-10-CM

## 2015-04-27 DIAGNOSIS — K279 Peptic ulcer, site unspecified, unspecified as acute or chronic, without hemorrhage or perforation: Secondary | ICD-10-CM | POA: Diagnosis present

## 2015-04-27 DIAGNOSIS — I639 Cerebral infarction, unspecified: Secondary | ICD-10-CM | POA: Diagnosis present

## 2015-04-27 DIAGNOSIS — Z885 Allergy status to narcotic agent status: Secondary | ICD-10-CM

## 2015-04-27 DIAGNOSIS — I351 Nonrheumatic aortic (valve) insufficiency: Secondary | ICD-10-CM | POA: Diagnosis present

## 2015-04-27 DIAGNOSIS — J45909 Unspecified asthma, uncomplicated: Secondary | ICD-10-CM | POA: Diagnosis present

## 2015-04-27 DIAGNOSIS — I7774 Dissection of vertebral artery: Secondary | ICD-10-CM | POA: Diagnosis present

## 2015-04-27 DIAGNOSIS — I63512 Cerebral infarction due to unspecified occlusion or stenosis of left middle cerebral artery: Principal | ICD-10-CM | POA: Insufficient documentation

## 2015-04-27 DIAGNOSIS — R51 Headache: Secondary | ICD-10-CM

## 2015-04-27 DIAGNOSIS — Z853 Personal history of malignant neoplasm of breast: Secondary | ICD-10-CM | POA: Diagnosis not present

## 2015-04-27 DIAGNOSIS — Z881 Allergy status to other antibiotic agents status: Secondary | ICD-10-CM | POA: Diagnosis not present

## 2015-04-27 DIAGNOSIS — R4781 Slurred speech: Secondary | ICD-10-CM | POA: Diagnosis not present

## 2015-04-27 DIAGNOSIS — I679 Cerebrovascular disease, unspecified: Secondary | ICD-10-CM | POA: Insufficient documentation

## 2015-04-27 DIAGNOSIS — M5030 Other cervical disc degeneration, unspecified cervical region: Secondary | ICD-10-CM | POA: Diagnosis present

## 2015-04-27 DIAGNOSIS — E1159 Type 2 diabetes mellitus with other circulatory complications: Secondary | ICD-10-CM | POA: Diagnosis present

## 2015-04-27 DIAGNOSIS — K219 Gastro-esophageal reflux disease without esophagitis: Secondary | ICD-10-CM | POA: Diagnosis present

## 2015-04-27 DIAGNOSIS — R519 Headache, unspecified: Secondary | ICD-10-CM | POA: Insufficient documentation

## 2015-04-27 DIAGNOSIS — I152 Hypertension secondary to endocrine disorders: Secondary | ICD-10-CM | POA: Diagnosis present

## 2015-04-27 HISTORY — DX: Cerebral infarction, unspecified: I63.9

## 2015-04-27 LAB — DIFFERENTIAL
BASOS PCT: 1 % (ref 0–1)
Basophils Absolute: 0 10*3/uL (ref 0.0–0.1)
EOS PCT: 2 % (ref 0–5)
Eosinophils Absolute: 0.1 10*3/uL (ref 0.0–0.7)
Lymphocytes Relative: 28 % (ref 12–46)
Lymphs Abs: 1.7 10*3/uL (ref 0.7–4.0)
MONO ABS: 0.4 10*3/uL (ref 0.1–1.0)
MONOS PCT: 7 % (ref 3–12)
Neutro Abs: 3.8 10*3/uL (ref 1.7–7.7)
Neutrophils Relative %: 62 % (ref 43–77)

## 2015-04-27 LAB — COMPREHENSIVE METABOLIC PANEL
ALT: 19 U/L (ref 14–54)
ANION GAP: 10 (ref 5–15)
AST: 19 U/L (ref 15–41)
Albumin: 3.7 g/dL (ref 3.5–5.0)
Alkaline Phosphatase: 78 U/L (ref 38–126)
BUN: 14 mg/dL (ref 6–20)
CALCIUM: 9.3 mg/dL (ref 8.9–10.3)
CHLORIDE: 104 mmol/L (ref 101–111)
CO2: 26 mmol/L (ref 22–32)
CREATININE: 0.81 mg/dL (ref 0.44–1.00)
Glucose, Bld: 149 mg/dL — ABNORMAL HIGH (ref 65–99)
Potassium: 4.1 mmol/L (ref 3.5–5.1)
SODIUM: 140 mmol/L (ref 135–145)
Total Bilirubin: 0.3 mg/dL (ref 0.3–1.2)
Total Protein: 6 g/dL — ABNORMAL LOW (ref 6.5–8.1)

## 2015-04-27 LAB — PROTIME-INR
INR: 1.01 (ref 0.00–1.49)
PROTHROMBIN TIME: 13.5 s (ref 11.6–15.2)

## 2015-04-27 LAB — CBC
HEMATOCRIT: 37.5 % (ref 36.0–46.0)
Hemoglobin: 13.1 g/dL (ref 12.0–15.0)
MCH: 30.9 pg (ref 26.0–34.0)
MCHC: 34.9 g/dL (ref 30.0–36.0)
MCV: 88.4 fL (ref 78.0–100.0)
Platelets: 162 10*3/uL (ref 150–400)
RBC: 4.24 MIL/uL (ref 3.87–5.11)
RDW: 13.2 % (ref 11.5–15.5)
WBC: 6.1 10*3/uL (ref 4.0–10.5)

## 2015-04-27 LAB — SEDIMENTATION RATE: Sed Rate: 25 mm/hr — ABNORMAL HIGH (ref 0–22)

## 2015-04-27 LAB — I-STAT TROPONIN, ED: TROPONIN I, POC: 0 ng/mL (ref 0.00–0.08)

## 2015-04-27 LAB — APTT: aPTT: 25 seconds (ref 24–37)

## 2015-04-27 MED ORDER — AMLODIPINE BESYLATE 5 MG PO TABS
5.0000 mg | ORAL_TABLET | Freq: Every day | ORAL | Status: DC
  Administered 2015-04-28: 5 mg via ORAL
  Filled 2015-04-27: qty 1

## 2015-04-27 MED ORDER — ASPIRIN EC 325 MG PO TBEC
325.0000 mg | DELAYED_RELEASE_TABLET | Freq: Every day | ORAL | Status: DC
  Administered 2015-04-27 – 2015-04-28 (×2): 325 mg via ORAL
  Filled 2015-04-27 (×2): qty 1

## 2015-04-27 MED ORDER — NORTRIPTYLINE HCL 10 MG PO CAPS
20.0000 mg | ORAL_CAPSULE | Freq: Every day | ORAL | Status: DC
  Administered 2015-04-27: 20 mg via ORAL
  Filled 2015-04-27 (×2): qty 2

## 2015-04-27 MED ORDER — STROKE: EARLY STAGES OF RECOVERY BOOK
Freq: Once | Status: AC
  Administered 2015-04-27: 19:00:00
  Filled 2015-04-27: qty 1

## 2015-04-27 MED ORDER — ENOXAPARIN SODIUM 40 MG/0.4ML ~~LOC~~ SOLN
40.0000 mg | SUBCUTANEOUS | Status: DC
  Administered 2015-04-27 – 2015-04-28 (×2): 40 mg via SUBCUTANEOUS
  Filled 2015-04-27 (×2): qty 0.4

## 2015-04-27 MED ORDER — CLOPIDOGREL BISULFATE 75 MG PO TABS
75.0000 mg | ORAL_TABLET | Freq: Every day | ORAL | Status: DC
  Administered 2015-04-28: 75 mg via ORAL
  Filled 2015-04-27: qty 1

## 2015-04-27 MED ORDER — SENNOSIDES-DOCUSATE SODIUM 8.6-50 MG PO TABS: 1.0000 | ORAL_TABLET | Freq: Every evening | ORAL | Status: DC | PRN

## 2015-04-27 MED ORDER — ALBUTEROL SULFATE (2.5 MG/3ML) 0.083% IN NEBU: 3.0000 mL | INHALATION_SOLUTION | Freq: Four times a day (QID) | RESPIRATORY_TRACT | Status: DC | PRN

## 2015-04-27 MED ORDER — FAMOTIDINE 20 MG PO TABS
20.0000 mg | ORAL_TABLET | Freq: Every day | ORAL | Status: DC
  Administered 2015-04-27 – 2015-04-28 (×2): 20 mg via ORAL
  Filled 2015-04-27 (×2): qty 1

## 2015-04-27 MED ORDER — ACETAMINOPHEN 325 MG PO TABS
650.0000 mg | ORAL_TABLET | Freq: Once | ORAL | Status: AC
  Administered 2015-04-27: 650 mg via ORAL
  Filled 2015-04-27: qty 2

## 2015-04-27 NOTE — ED Provider Notes (Signed)
CSN: [id number]     Arrival date & time 04/27/15  1132 History   First MD Initiated Contact with Patient 04/27/15 1248     Chief Complaint  Patient presents with  . Aphasia  . Headache  . Fatigue     (Consider location/radiation/quality/duration/timing/severity/associated sxs/prior Treatment) HPI [name] is a 74 y.o. female presents to emergency department complaining of headaches and slurred speech. Patient states symptoms started 4 days ago. States onset of headache is gradual, comes and goes, currently 7 out of 10. States she has also had 4 episodes of slurred speech, last episode this morning. She states during these episodes her speech is slurred to wear her husband could not understand her. Patient states his up in the last few minutes at a time. She denies any head injuries. She denies any neck pain or stiffness. She denies any blurred vision. She denies any nausea, vomiting. No other complaints.  Past Medical History  Diagnosis Date  . Mild aortic insufficiency 03/28/2006    echo  . H/O chest pain 03/24/2006    normal nuclear  study  . Sinus bradycardia on ECG   . Cancer     right breast mastectomy  . DDD (degenerative disc disease), cervical   . Normal cardiac stress test 2005  . Asthma   . PONV (postoperative nausea and vomiting)   . Hypertension    Past Surgical History  Procedure Laterality Date  . Mastectomy  1993    right breast  . Other surgical history      hysterectomy  . Rectal fissure    . Childbirth      x 2  . Tubal ligation    . Appendectomy      "30 years ago"  . Cholecystectomy      "15 years ago"  . Esophagogastroduodenoscopy N/A 03/04/2014    Procedure: ESOPHAGOGASTRODUODENOSCOPY (EGD);  Surgeon: [name], MD;  Location: [facility] ENDOSCOPY;  Service: Endoscopy;  Laterality: N/A;   Family History  Problem Relation Age of Onset  . Heart attack Mother   . Heart disease Mother   . Hypertension Mother   . Heart attack Father   . Heart  disease Father   . Hypertension Sister   . Heart attack Sister   . Hypertension Sister   . Heart attack Sister    Social History  Substance Use Topics  . Smoking status: Never Smoker   . Smokeless tobacco: Never Used  . Alcohol Use: No   OB History    No data available     Review of Systems  Constitutional: Negative for fever and chills.  Respiratory: Negative for cough, chest tightness and shortness of breath.   Cardiovascular: Negative for chest pain, palpitations and leg swelling.  Gastrointestinal: Negative for nausea, vomiting, abdominal pain and diarrhea.  Genitourinary: Negative for dysuria and flank pain.  Musculoskeletal: Negative for myalgias, arthralgias, neck pain and neck stiffness.  Skin: Negative for rash.  Neurological: Positive for speech difficulty and headaches. Negative for dizziness, weakness and light-headedness.  All other systems reviewed and are negative.     Allergies  Doxycycline and Percocet  Home Medications   Prior to Admission medications   Medication Sig Start Date End Date Taking? Authorizing Provider  albuterol (PROAIR HFA) 108 (90 BASE) MCG/ACT inhaler Inhale 2 puffs into the lungs every 6 (six) hours as needed for wheezing or shortness of breath.    Historical Provider, MD  amLODipine (NORVASC) 5 MG tablet Take 5 mg by  mouth daily.    Historical Provider, MD  aspirin EC 81 MG tablet Take 1 tablet (81 mg total) by mouth daily. Patient not taking: Reported on 04/23/2015 11/21/14   Ward Givens, NP  clopidogrel (PLAVIX) 75 MG tablet Take 1 tablet (75 mg total) by mouth daily. 11/21/14   Ward Givens, NP  Fluticasone-Salmeterol (ADVAIR) 250-50 MCG/DOSE AEPB Inhale 1 puff into the lungs daily as needed (for cough).    Historical Provider, MD  loratadine (CLARITIN) 10 MG tablet Take 10 mg by mouth daily.    Historical Provider, MD  meclizine (ANTIVERT) 25 MG tablet TAKE 1 TABLET BY MOUTH 3 TIMES A DAY AS NEEDED 10/25/14   Asencion Partridge Dohmeier, MD   montelukast (SINGULAIR) 10 MG tablet Take 10 mg by mouth at bedtime.    Historical Provider, MD  nortriptyline (PAMELOR) 10 MG capsule Take 2 capsules (20 mg total) by mouth at bedtime. 10/21/14   Ward Givens, NP  pantoprazole (PROTONIX) 40 MG tablet Take 1 tablet (40 mg total) by mouth 2 (two) times daily before a meal. Twice a day for 4 weeks, then daily 07/02/14   Asencion Partridge Dohmeier, MD   BP 160/69 mmHg  Pulse 73  Temp(Src) 98 F (36.7 C) (Oral)  Resp 22  Ht 5\' 4"  (1.626 m)  Wt 151 lb 1.6 oz (68.539 kg)  BMI 25.92 kg/m2  SpO2 99% Physical Exam  Constitutional: She is oriented to person, place, and time. She appears well-developed and well-nourished. No distress.  HENT:  Head: Normocephalic.  Eyes: Conjunctivae and EOM are normal. Pupils are equal, round, and reactive to light.  Neck: Neck supple.  Cardiovascular: Normal rate, regular rhythm and normal heart sounds.   Pulmonary/Chest: Effort normal and breath sounds normal. No respiratory distress. She has no wheezes. She has no rales.  Abdominal: Soft. Bowel sounds are normal. She exhibits no distension. There is no tenderness. There is no rebound.  Musculoskeletal: She exhibits no edema.  Neurological: She is oriented to person, place, and time.  5/5 and equal upper and lower extremity strength bilaterally. Equal grip strength bilaterally. Normal finger to nose and heel to shin. No pronator drift.  Skin: Skin is warm and dry.  Psychiatric: She has a normal mood and affect. Her behavior is normal.  Nursing note and vitals reviewed.   ED Course  Procedures (including critical care time) Labs Review Labs Reviewed  COMPREHENSIVE METABOLIC PANEL - Abnormal; Notable for the following:    Glucose, Bld 149 (*)    Total Protein 6.0 (*)    All other components within normal limits  PROTIME-INR  APTT  CBC  DIFFERENTIAL  Randolm Idol, ED    Imaging Review [name] Contrast  04/27/2015   CLINICAL DATA:  Slurred speech  developed 3 days ago. Difficulty word forming. Headache.  EXAM: MRI HEAD WITHOUT CONTRAST  TECHNIQUE: Multiplanar, multiecho pulse sequences of the brain and surrounding structures were obtained without intravenous contrast.  COMPARISON:  Head CT 11/21/2014.  MRI 06/30/2014.  FINDINGS: Diffusion imaging shows a sub cm acute infarction in the left parietal subcortical white matter. No large vessel territory infarction. The brainstem and cerebellum are normal. The cerebral hemispheres show moderate chronic small-vessel ischemic changes throughout the white matter. No mass lesion, hemorrhage, hydrocephalus or extra-axial collection. No pituitary mass. No inflammatory sinus disease. No skull or skullbase lesion. Major vessels at the base of the brain show flow.  IMPRESSION: Sub cm acute infarction in the left parietal subcortical white matter. No hemorrhage, swelling  or mass effect.  Moderate chronic small vessel ischemic changes elsewhere throughout the brain.  These results will be called to the ordering clinician or representative by the Radiologist Assistant, and communication documented in the PACS or zVision Dashboard.   Electronically Signed   By: Nelson Chimes M.D.   On: 04/27/2015 15:24   I, Jeannett Senior A, personally reviewed and evaluated these images and lab results as part of my medical decision-making.   EKG Interpretation None      MDM   Final diagnoses:  CVA (cerebral vascular accident)     patient with intermittent slurred speech and headache. Blood pressure is elevated, 160/69 here. Exam unremarkable this time. Discussed with Dr. Venora Maples, will get MRI. Labs are back and unremarkable.   3:38 PM MRI showing acute infarct in left parietal subcortical white matter. Will admit for further evaluation.   3:49 PM Spoke with triad. Will admit.    Jeannett Senior, PA-C 04/28/15 [place], MD 05/01/15 [phone]

## 2015-04-27 NOTE — Consult Note (Signed)
Referring Physician: ED    Chief Complaint: HA, dysarthria, stroke on MRI  HPI:                                                                                                                                         [name] is an 74 y.o. female with a past medical history that is relevant for HTN,vertebral artery dissection, vertigo, migraines  breast cancer s/p right mastectomy, and asthma, admitted to [facility] for evaluation of acute left parietal infarct. Patient reports having episodes of getting words out, " my words were not coming out correctly". She said that in addition to that she has been having head pain for several days. She denies associated vertigo, double vision, difficulty swallowing, focal weakness or numbness, imbalance, confusion, or vision impairment. Stated that she was advised to take aspirin and plavix in combination after she was found to have a vertebral artery dissection few months ago but this is causing her to bruise easily and for that reason she stopped it for a short while and resumed it taking both one week ago. MRI.MRA brain performed today were independently reviewed and showed a sub cm acute infarction in the left parietal subcortical white matter as well as mid basilar occlusion, severely diseased distal right VA, and diseased or occluded LEFT M2 MCA trifurcation branch.  Date last known well: uncertain  Time last known well: uncertain tPA Given: no, late presentation   Past Medical History  Diagnosis Date  . Mild aortic insufficiency 03/28/2006    echo  . H/O chest pain 03/24/2006    normal nuclear  study  . Sinus bradycardia on ECG   . Cancer     right breast mastectomy  . DDD (degenerative disc disease), cervical   . Normal cardiac stress test 2005  . Asthma   . PONV (postoperative nausea and vomiting)   . Hypertension     Past Surgical History  Procedure Laterality Date  . Mastectomy  1993    right breast  . Other surgical history     hysterectomy  . Rectal fissure    . Childbirth      x 2  . Tubal ligation    . Appendectomy      "30 years ago"  . Cholecystectomy      "15 years ago"  . Esophagogastroduodenoscopy N/A 03/04/2014    Procedure: ESOPHAGOGASTRODUODENOSCOPY (EGD);  Surgeon: [name], MD;  Location: [facility] ENDOSCOPY;  Service: Endoscopy;  Laterality: N/A;    Family History  Problem Relation Age of Onset  . Heart attack Mother   . Heart disease Mother   . Hypertension Mother   . Heart attack Father   . Heart disease Father   . Hypertension Sister   . Heart attack Sister   . Hypertension Sister   . Heart attack Sister    Social History:  reports that she  has never smoked. She has never used smokeless tobacco. She reports that she does not drink alcohol or use illicit drugs. Family history: no MS, PD, epilepsy, brain tumor, or rupture brain aneurysm Allergies:  Allergies  Allergen Reactions  . Doxycycline     REACTION: GI upset  . Percocet [Oxycodone-Acetaminophen] Nausea And Vomiting    Medications:                                                                                                                           Scheduled: . [START ON 04/28/2015] amLODipine  5 mg Oral Daily  . aspirin EC  325 mg Oral Daily  . [START ON 04/28/2015] clopidogrel  75 mg Oral Daily  . enoxaparin (LOVENOX) injection  40 mg Subcutaneous Q24H  . famotidine  20 mg Oral Daily  . nortriptyline  20 mg Oral QHS    ROS:                                                                                                                                       History obtained from chart review and the patient  General ROS: negative for - chills, fatigue, fever, night sweats, weight gain or weight loss Psychological ROS: negative for - behavioral disorder, hallucinations, memory difficulties, mood swings or suicidal ideation Ophthalmic ROS: negative for - blurry vision, double vision, eye pain or loss of vision ENT ROS:  negative for - epistaxis, nasal discharge, oral lesions, sore throat, tinnitus or vertigo Allergy and Immunology ROS: negative for - hives or itchy/watery eyes Hematological and Lymphatic ROS: negative for - bleeding problems, bruising or swollen lymph nodes Endocrine ROS: negative for - galactorrhea, hair pattern changes, polydipsia/polyuria or temperature intolerance Respiratory ROS: negative for - cough, hemoptysis, shortness of breath or wheezing Cardiovascular ROS: negative for - chest pain, dyspnea on exertion, edema or irregular heartbeat Gastrointestinal ROS: negative for - abdominal pain, diarrhea, hematemesis, nausea/vomiting or stool incontinence Genito-Urinary ROS: negative for - dysuria, hematuria, incontinence or urinary frequency/urgency Musculoskeletal ROS: negative for - joint swelling or muscular weakness Neurological ROS: as noted in HPI Dermatological ROS: negative for rash and skin lesion changes   Physical exam:  Constitutional: well developed, pleasant female in no apparent distress. Blood pressure 141/71, pulse 60, temperature 97.9 F (36.6 C), temperature source Oral, resp. rate 17, height '5\' 4"'  (1.626 m), weight 67.949 kg (149 lb  12.8 oz), SpO2 96 %. Eyes: no jaundice or exophthalmos.  Head: normocephalic. Neck: supple, no bruits, no JVD. Cardiac: no murmurs. Lungs: clear. Abdomen: soft, no tender, no mass. Extremities: no edema, clubbing, or cyanosis.  Skin: no rash  Neurologic Examination:                                                                                                      General: Mental Status: Alert, oriented, thought content appropriate.  Speech fluent without evidence of aphasia.  Able to follow 3 step commands without difficulty. Cranial Nerves: II: Discs flat bilaterally; Visual fields grossly normal, pupils equal, round, reactive to light and accommodation III,IV, VI: ptosis not present, extra-ocular motions intact bilaterally V,VII:  smile symmetric, facial light touch sensation normal bilaterally VIII: hearing normal bilaterally IX,X: uvula rises symmetrically XI: bilateral shoulder shrug XII: midline tongue extension without atrophy or fasciculations Motor: Right : Upper extremity   5/5    Left:     Upper extremity   5/5  Lower extremity   5/5     Lower extremity   5/5 Tone and bulk:normal tone throughout; no atrophy noted Sensory: Pinprick and light touch intact throughout, bilaterally Deep Tendon Reflexes:  Right: Upper Extremity   Left: Upper extremity   biceps (C-5 to C-6) 2/4   biceps (C-5 to C-6) 2/4 tricep (C7) 2/4    triceps (C7) 2/4 Brachioradialis (C6) 2/4  Brachioradialis (C6) 2/4  Lower Extremity Lower Extremity  quadriceps (L-2 to L-4) 2/4   quadriceps (L-2 to L-4) 2/4 Achilles (S1) 2/4   Achilles (S1) 2/4  Plantars: Right: downgoing   Left: downgoing Cerebellar: normal finger-to-nose,  normal heel-to-shin test Gait:  No ataxia    Results for orders placed or performed during the hospital encounter of 04/27/15 (from the past 48 hour(s))  Protime-INR     Status: None   Collection Time: 04/27/15 12:05 PM  Result Value Ref Range   Prothrombin Time 13.5 11.6 - 15.2 seconds   INR 1.01 0.00 - 1.49  APTT     Status: None   Collection Time: 04/27/15 12:05 PM  Result Value Ref Range   aPTT 25 24 - 37 seconds  CBC     Status: None   Collection Time: 04/27/15 12:05 PM  Result Value Ref Range   WBC 6.1 4.0 - 10.5 K/uL   RBC 4.24 3.87 - 5.11 MIL/uL   Hemoglobin 13.1 12.0 - 15.0 g/dL   HCT 37.5 36.0 - 46.0 %   MCV 88.4 78.0 - 100.0 fL   MCH 30.9 26.0 - 34.0 pg   MCHC 34.9 30.0 - 36.0 g/dL   RDW 13.2 11.5 - 15.5 %   Platelets 162 150 - 400 K/uL  Differential     Status: None   Collection Time: 04/27/15 12:05 PM  Result Value Ref Range   Neutrophils Relative % 62 43 - 77 %   Neutro Abs 3.8 1.7 - 7.7 K/uL   Lymphocytes Relative 28 12 - 46 %   Lymphs Abs 1.7 0.7 - 4.0 K/uL   Monocytes  Relative 7 3 - 12 %   Monocytes Absolute 0.4 0.1 - 1.0 K/uL   Eosinophils Relative 2 0 - 5 %   Eosinophils Absolute 0.1 0.0 - 0.7 K/uL   Basophils Relative 1 0 - 1 %   Basophils Absolute 0.0 0.0 - 0.1 K/uL  Comprehensive metabolic panel     Status: Abnormal   Collection Time: 04/27/15 12:05 PM  Result Value Ref Range   Sodium 140 135 - 145 mmol/L   Potassium 4.1 3.5 - 5.1 mmol/L   Chloride 104 101 - 111 mmol/L   CO2 26 22 - 32 mmol/L   Glucose, Bld 149 (H) 65 - 99 mg/dL   BUN 14 6 - 20 mg/dL   Creatinine, Ser 0.81 0.44 - 1.00 mg/dL   Calcium 9.3 8.9 - 10.3 mg/dL   Total Protein 6.0 (L) 6.5 - 8.1 g/dL   Albumin 3.7 3.5 - 5.0 g/dL   AST 19 15 - 41 U/L   ALT 19 14 - 54 U/L   Alkaline Phosphatase 78 38 - 126 U/L   Total Bilirubin 0.3 0.3 - 1.2 mg/dL   GFR calc non Af Amer >60 >60 mL/min   GFR calc Af Amer >60 >60 mL/min    Comment: (NOTE) The eGFR has been calculated using the CKD EPI equation. This calculation has not been validated in all clinical situations. eGFR's persistently <60 mL/min signify possible Chronic Kidney Disease.    Anion gap 10 5 - 15  I-stat troponin, ED (not at [facility], [facility])     Status: None   Collection Time: 04/27/15 12:11 PM  Result Value Ref Range   Troponin i, poc 0.00 0.00 - 0.08 ng/mL   Comment 3            Comment: Due to the release kinetics of cTnI, a negative result within the first hours of the onset of symptoms does not rule out myocardial infarction with certainty. If myocardial infarction is still suspected, repeat the test at appropriate intervals.   Sedimentation rate     Status: Abnormal   Collection Time: 04/27/15  7:58 PM  Result Value Ref Range   Sed Rate 25 (H) 0 - 22 mm/hr   Dg Chest 2 View  04/27/2015   CLINICAL DATA:  Stroke  EXAM: CHEST  2 VIEW  COMPARISON:  03/02/2014  FINDINGS: Lungs are clear.  No pleural effusion or pneumothorax.  The heart is normal in size.  Degenerative changes of the visualized thoracolumbar spine.   Status post right mastectomy with right axillary lymph node dissection.  IMPRESSION: No evidence of acute cardiopulmonary disease.   Electronically Signed   By: Julian Hy M.D.   On: 04/27/2015 18:38   [name] Contrast  04/27/2015   CLINICAL DATA:  Slurred speech developed 3 days ago. Difficulty word forming. Headache.  EXAM: MRI HEAD WITHOUT CONTRAST  TECHNIQUE: Multiplanar, multiecho pulse sequences of the brain and surrounding structures were obtained without intravenous contrast.  COMPARISON:  Head CT 11/21/2014.  MRI 06/30/2014.  FINDINGS: Diffusion imaging shows a sub cm acute infarction in the left parietal subcortical white matter. No large vessel territory infarction. The brainstem and cerebellum are normal. The cerebral hemispheres show moderate chronic small-vessel ischemic changes throughout the white matter. No mass lesion, hemorrhage, hydrocephalus or extra-axial collection. No pituitary mass. No inflammatory sinus disease. No skull or skullbase lesion. Major vessels at the base of the brain show flow.  IMPRESSION: Sub cm acute infarction in the left parietal  subcortical white matter. No hemorrhage, swelling or mass effect.  Moderate chronic small vessel ischemic changes elsewhere throughout the brain.  These results will be called to the ordering clinician or representative by the Radiologist Assistant, and communication documented in the PACS or zVision Dashboard.   Electronically Signed   By: Nelson Chimes M.D.   On: 04/27/2015 15:24   [name] Head/brain Wo Cm  04/27/2015   CLINICAL DATA:  Intermittent slurred speech beginning 3-4 days ago. History of vertebral artery dissection. MRI demonstrated acute LEFT parietal infarct.  EXAM: MRA HEAD WITHOUT CONTRAST  TECHNIQUE: Angiographic images of the Circle of Willis were obtained using MRA technique without intravenous contrast.  COMPARISON:  MRI brain earlier today. MRI brain 06/30/14. CTA head and neck 06/30/14.  FINDINGS: The internal  carotid arteries demonstrate dolichoectasia but otherwise widely patent. The A1 segment of the LEFT anterior cerebral artery is hypoplastic or severely diseased, and does not contribute appreciably to the anterior cerebral flow. The RIGHT A1 ACA is robust and supplies both distal anterior cerebral is. Mild disease in the LEFT A2 segment proximally.  Widely patent RIGHT M1 MCA and RIGHT MCA bifurcation branches. Slightly narrowed distal M1 MCA on the LEFT. Severely diseased or occluded LEFT MCA M2 trifurcation branch, with suspected abrupt termination image 134 series 2.  LEFT vertebral appears to terminate in the LEFT PICA. RIGHT vertebral is the apparent dominant contributor to the basilar, severely diseased in its distal V4 segment. Lack of flow related enhancement in the proximal to mid basilar correlates with prior CTA demonstrating a mid basilar occlusion. Robust posterior cerebral arteries may be fetal in origin, but contribute to distal basilar flow via posterior communicating arteries. The PICA and superior cerebellar arteries are patent. Nonvisualized AICAs. No intracranial aneurysm.  IMPRESSION: Mid basilar occlusion is probably stable from priors. Severely diseased distal RIGHT vertebral is redemonstrated.  Diseased or occluded LEFT M2 MCA trifurcation branch appears qualitatively different when compared with similar area on prior CTA.  The observed pattern of infarction in the LEFT parietal subcortical white matter could represent a watershed insult.   Electronically Signed   By: Staci Righter M.D.   On: 04/27/2015 18:49    Assessment: 74 y.o. female with complains of intermittent episodes of speech/language impairment and HA for the past 3 days. MRI/MRA brain revealed a sub cm acute infarction in the left parietal subcortical white matter as well as mid basilar occlusion, severely diseased distal right VA, and diseased or occluded LEFT M2 MCA trifurcation branch. Patient back on dual antiplatelet  therapy for the past week (stopped aspirin on her own but resumed taking it 1 week ago). Presently, asymptomatic from a neurological standpoint. Patient has symptomatic intracranial atherosclerotic disease, thus continue current therapy pending results stroke work up. Stroke team will resume care tomorrow.  Stroke Risk Factors - age, HTN, symptomatic intracranial atherosclerotic disease.   Dorian Pod, MD [facility] [phone]  04/27/2015, 10:11 PM

## 2015-04-27 NOTE — Telephone Encounter (Signed)
Patient has been having slurred speech intermittently for the past 4 days. She also reports that her headaches have increased. She is worried that she has had a stroke. Advised the patient that if she is having slurred speech she should go to the ED. She refused. I will see her in the office this afternoon.

## 2015-04-27 NOTE — H&P (Signed)
[facility] History and Physical                                                                                    [name], is a 74 y.o. female  MRN: [id number]   DOB - [birth year]  Admit Date - 04/27/2015  Outpatient Primary MD for the patient is Donnie Coffin, MD  Referring Physician:  Dr. Roddie Mc and Lahoma Rocker, ER PA  Chief Complaint:   Chief Complaint  Patient presents with  . Aphasia  . Headache  . Fatigue     HPI  [name]  is a 74 y.o. female, with hypertension and asthma with a history of vertebral artery dissection and breast cancer who presents to the emergency department after being directed here by her neurologist for slurred speech. The patient reports that on Saturday, August 6 she was having a phone conversation with her son when her speech became garbled. Soon afterward the problem resolved but happened again 3-4 days ago. The patient works part-time at [employer] in [employer]. 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 Again the problem resolved. The speech difficulties have happened intermittently several times since. She has also had a severe posterior headache, increased fatigue, and left eye pain for the past 3-4 days. She is concerned because she has missed taking her medications (including Plavix, aspirin, and blood pressure medications) for a day or 2 recently.  She denies dizziness, changes in vision, focal weakness in her extremities, fever, recent illness, vomiting, changes in bowel habits, dysuria, chest pain.  MRI done in the ER shows a subcentimeter acute infarction in the left parietal white matter.  Labs are otherwise normal other than a slightly elevated serum glucose   Review of Systems   In addition to the HPI above, she has a chronic cough that is related to GERD and is nonproductive. No Fever-chills, No problems swallowing food or Liquids, No Chest pain, Cough or Shortness of Breath, No Abdominal pain, No Nausea  or Vomiting, Bowel movements are regular, but sometimes hard to pass  No Blood in stool or Urine, No dysuria, No new skin rashes or bruises, No new joints pains-aches,  No new weakness, tingling, numbness in any extremity, No recent weight gain or loss, A full 10 point Review of Systems was done, except as stated above, all other Review of Systems were negative.  Past Medical History  Past Medical History  Diagnosis Date  . Mild aortic insufficiency 03/28/2006    echo  . H/O chest pain 03/24/2006    normal nuclear  study  . Sinus bradycardia on ECG   . Cancer     right breast mastectomy  . DDD (degenerative disc disease), cervical   . Normal cardiac stress test 2005  . Asthma   . PONV (postoperative nausea and vomiting)   . Hypertension     Past Surgical History  Procedure Laterality Date  . Mastectomy  1993    right breast  . Other surgical history      hysterectomy  . Rectal fissure    . Childbirth      x 2  .  Tubal ligation    . Appendectomy      "30 years ago"  . Cholecystectomy      "15 years ago"  . Esophagogastroduodenoscopy N/A 03/04/2014    Procedure: ESOPHAGOGASTRODUODENOSCOPY (EGD);  Surgeon: [name], MD;  Location: [facility] ENDOSCOPY;  Service: Endoscopy;  Laterality: N/A;      Social History Social History  Substance Use Topics  . Smoking status: Never Smoker   . Smokeless tobacco: Never Used  . 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 Heart attack Mother   . Heart disease Mother   . Hypertension Mother   . Heart attack Father   . Heart disease Father   . Hypertension Sister   . Heart attack Sister   . Hypertension Sister   . Heart attack Sister     Prior to Admission medications   Medication Sig Start Date End Date Taking? Authorizing Provider  amLODipine  (NORVASC) 5 MG tablet Take 5 mg by mouth daily.   Yes Historical Provider, MD  aspirin EC 81 MG tablet Take 1 tablet (81 mg total) by mouth daily. 11/21/14  Yes Ward Givens, NP  clopidogrel (PLAVIX) 75 MG tablet Take 1 tablet (75 mg total) by mouth daily. 11/21/14  Yes Ward Givens, NP  loratadine (CLARITIN) 10 MG tablet Take 10 mg by mouth daily.   Yes Historical Provider, MD  nortriptyline (PAMELOR) 10 MG capsule Take 2 capsules (20 mg total) by mouth at bedtime. 10/21/14  Yes Ward Givens, NP  albuterol (PROAIR HFA) 108 (90 BASE) MCG/ACT inhaler Inhale 2 puffs into the lungs every 6 (six) hours as needed for wheezing or shortness of breath.    Historical Provider, MD    Allergies  Allergen Reactions  . Doxycycline     REACTION: GI upset  . Percocet [Oxycodone-Acetaminophen] Nausea And Vomiting    Physical Exam  Vitals  Blood pressure 153/78, pulse 65, temperature 98 F (36.7 C), temperature source Oral, resp. rate 20, height 5\' 4"  (1.626 m), weight 68.539 kg (151 lb 1.6 oz), SpO2 99 %.   General: Well-developed, well-nourished female, perky, lying in bed in NAD, husband bedside  Psych:  Normal affect and insight, Not Suicidal or Homicidal, Awake Alert, Oriented X 3.  Neuro:   No F.N deficits, ALL C.Nerves Intact, Strength 5/5 all 4 extremities, Sensation intact all 4 extremities.  ENT:  Ears and Eyes appear Normal, Conjunctivae clear, PER. Moist oral mucosa without erythema or exudates.  Neck:  Supple, No lymphadenopathy appreciated  Respiratory:  Symmetrical chest wall movement, Good air movement bilaterally, CTAB.  Cardiac:  RRR, No Murmurs, no LE edema noted, no JVD.    Abdomen:  Positive bowel sounds, Soft, Non tender, Non distended,  No masses appreciated  Skin:  No Cyanosis, Normal Skin Turgor, No Skin Rash or Bruise.  Extremities:  Able to move all 4. 5/5 strength in each,  no effusions.  Data Review  CBC  Recent Labs Lab 04/27/15 1205  WBC 6.1  HGB  13.1  HCT 37.5  PLT 162  MCV 88.4  MCH 30.9  MCHC 34.9  RDW 13.2  LYMPHSABS 1.7  MONOABS 0.4  EOSABS 0.1  BASOSABS 0.0    Chemistries   Recent Labs Lab 04/27/15 1205  NA 140  K 4.1  CL  104  CO2 26  GLUCOSE 149*  BUN 14  CREATININE 0.81  CALCIUM 9.3  AST 19  ALT 19  ALKPHOS 78  BILITOT 0.3     Coagulation profile  Recent Labs Lab 04/27/15 1205  INR 1.01   Urinalysis is pending.  Imaging results:   Mr Herby Abraham Contrast  04/27/2015   CLINICAL DATA:  Slurred speech developed 3 days ago. Difficulty word forming. Headache.  EXAM: MRI HEAD WITHOUT CONTRAST  TECHNIQUE: Multiplanar, multiecho pulse sequences of the brain and surrounding structures were obtained without intravenous contrast.  COMPARISON:  Head CT 11/21/2014.  MRI 06/30/2014.  FINDINGS: Diffusion imaging shows a sub cm acute infarction in the left parietal subcortical white matter. No large vessel territory infarction. The brainstem and cerebellum are normal. The cerebral hemispheres show moderate chronic small-vessel ischemic changes throughout the white matter. No mass lesion, hemorrhage, hydrocephalus or extra-axial collection. No pituitary mass. No inflammatory sinus disease. No skull or skullbase lesion. Major vessels at the base of the brain show flow.  IMPRESSION: Sub cm acute infarction in the left parietal subcortical white matter. No hemorrhage, swelling or mass effect.  Moderate chronic small vessel ischemic changes elsewhere throughout the brain.  These results will be called to the ordering clinician or representative by the Radiologist Assistant, and communication documented in the PACS or zVision Dashboard.   Electronically Signed   By: Nelson Chimes M.D.   On: 04/27/2015 15:24    My personal review of EKG: NSR, No ST changes noted. No QTC prolongation   Assessment & Plan  Principal Problem:   Stroke Active Problems:   G E R D   PUD (peptic ulcer disease)   HTN (hypertension)  Left  parietal infarct The patient was previously taking an 81 mg aspirin and Plavix. The patient actually mentioned that she heard she should not take Protonix with Plavix. I will discontinue her Protonix and place her on Pepcid. Continue Plavix, add 325 mg aspirin. Neurology/Stroke team to finalize antiplatelets therapy. MRA, 2-D echo, carotid Dopplers, serologies, PT, OT, speech evaluations pending Anticipate the patient will be able to discharge 8/16.  GERD Will change Protonix to Pepcid.  Hypertension Patient laments that her systolic blood pressure is usually in the 160s-170s. Will hold amlodipine for now as MR indicates this is an acute stroke. Would increase amlodipine dose to 10 mg at discharge and consider adding a second anti-hypertensive medication.   Consultants Called:  Neurology  Family Communication:   Husband bedside  Code Status:  Full  Condition:  Stable  Potential Disposition: To home in 24-48 hours  Time spent in minutes : [address]  PA-C on 04/27/2015 at 5:14 PM Between 7am to 7pm - Pager - [phone] After 7pm go to [email or web address] - password [email or web address] And look for the night coverage person covering me after hours  [facility]

## 2015-04-27 NOTE — ED Notes (Signed)
Patient transported to CT 

## 2015-04-27 NOTE — Telephone Encounter (Signed)
Pt called, states she is having slurred speech, a lot of headaches, eyes, especially left eye is hurting. She feels like she may be having a stroke and may need to be seen. Yesterday she had a real bad cramp on left side from hip all the way down leg. She was coming back from the beach when this happened. She has never had a cramp like this before. BP is still up, 164/78 last night.

## 2015-04-27 NOTE — ED Notes (Signed)
Family at bedside. 

## 2015-04-27 NOTE — Telephone Encounter (Signed)
Spoke to pt. She has been having slurred speech on and off for four days. She says that when she is trying to say something, it comes out garbled.However, these spells go away. I advised pt that she needs to go to ER immediately if she is having slurred speech, because this is a sign of a stroke. Pt refuses to go to ER at this time. She says that she should come to the office and have an MRI done here. I advised pt that if the pt is having a stroke, she needs immediate treatment that can only be given in the hospital and the longer she waits, the more brain that can be damaged. Pt is insisting that I speak to Dr. Brett Fairy and ask her to order an MRI to be done today so that she does not have to go to the ER.  I was able to speak to [facility], NP and she still advised me to tell the pt to go to the ER. Even if the MRI was ordered by our office today, it still would not be performed today, and that only the ER could do it ermergently. Pt still does not agree with this. Jinny Blossom, NP agreed to speak to pt.

## 2015-04-27 NOTE — ED Notes (Signed)
Secretary states the bed hasn't been assigned and charge RN will call back.

## 2015-04-27 NOTE — ED Notes (Signed)
Pt reports onset 3 days ago of slurred speech, difficulty getting her words out, headache and fatigue. pcp sent her here for MRI. Grips are equal, no arm drift, no facial droop noted.

## 2015-04-27 NOTE — ED Notes (Signed)
Pt and family made aware of bed assignment 

## 2015-04-27 NOTE — Progress Notes (Signed)
Patient arrived to 5W10 from ED. Safety precautions and orders reviewed with patient/family. TELE applied and confirmed. Patient c/o slight headache which starting to get better. No other distress noted. MD paged of arrival. Will continue to monitor.  Ave Filter, RN

## 2015-04-28 ENCOUNTER — Inpatient Hospital Stay (HOSPITAL_COMMUNITY): Payer: Medicare Other

## 2015-04-28 DIAGNOSIS — I63512 Cerebral infarction due to unspecified occlusion or stenosis of left middle cerebral artery: Secondary | ICD-10-CM | POA: Insufficient documentation

## 2015-04-28 DIAGNOSIS — E785 Hyperlipidemia, unspecified: Secondary | ICD-10-CM | POA: Insufficient documentation

## 2015-04-28 DIAGNOSIS — I679 Cerebrovascular disease, unspecified: Secondary | ICD-10-CM

## 2015-04-28 DIAGNOSIS — I6789 Other cerebrovascular disease: Secondary | ICD-10-CM

## 2015-04-28 DIAGNOSIS — I10 Essential (primary) hypertension: Secondary | ICD-10-CM

## 2015-04-28 DIAGNOSIS — I639 Cerebral infarction, unspecified: Secondary | ICD-10-CM

## 2015-04-28 LAB — LIPID PANEL
CHOLESTEROL: 220 mg/dL — AB (ref 0–200)
HDL: 36 mg/dL — ABNORMAL LOW (ref >40)
LDL Cholesterol: 124 mg/dL — ABNORMAL HIGH (ref 0–99)
Total CHOL/HDL Ratio: 6.1 RATIO
Triglycerides: 301 mg/dL — ABNORMAL HIGH (ref <150)
VLDL: 60 mg/dL — ABNORMAL HIGH (ref 0–40)

## 2015-04-28 MED ORDER — ACETAMINOPHEN 325 MG PO TABS
650.0000 mg | ORAL_TABLET | ORAL | Status: DC | PRN
  Administered 2015-04-28: 650 mg via ORAL
  Filled 2015-04-28: qty 2

## 2015-04-28 MED ORDER — ATORVASTATIN CALCIUM 80 MG PO TABS
80.0000 mg | ORAL_TABLET | Freq: Every day | ORAL | Status: DC
  Administered 2015-04-28: 80 mg via ORAL
  Filled 2015-04-28: qty 1

## 2015-04-28 MED ORDER — ASPIRIN 325 MG PO TBEC: 325.0000 mg | DELAYED_RELEASE_TABLET | Freq: Every day | ORAL | Status: DC

## 2015-04-28 MED ORDER — ATORVASTATIN CALCIUM 80 MG PO TABS: 80.0000 mg | ORAL_TABLET | Freq: Every day | ORAL | Status: DC

## 2015-04-28 MED ORDER — FAMOTIDINE 20 MG PO TABS: 20.0000 mg | ORAL_TABLET | Freq: Every day | ORAL | Status: DC

## 2015-04-28 NOTE — Evaluation (Signed)
Speech Language Pathology Evaluation Patient Details Name: [name] MRN: [id number] DOB: 06/30/1941 Today's Date: 04/28/2015 Time: 3664-4034 SLP Time Calculation (min) (ACUTE ONLY): 12 min  Problem List:  Patient Active Problem List   Diagnosis Date Noted  . CVA (cerebral vascular accident) 04/27/2015  . Stroke 04/27/2015  . Cephalalgia   . Extrinsic asthma 07/02/2014  . Migraine variant with status migrainosus 07/02/2014  . Benign paroxysmal positional vertigo 07/02/2014  . 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 Acute pancreatitis 03/03/2014  . PUD (peptic ulcer disease) 03/03/2014  . HTN (hypertension) 03/03/2014  . Abdominal pain 03/03/2014  . Nausea with vomiting 03/03/2014  . Mild aortic insufficiency   . H/O chest pain   . Sinus bradycardia on ECG   . G E R D 03/24/2008  . ADENOCARCINOMA, BREAST, RIGHT 06/30/2007  . ASTHMA 06/30/2007  . Upper airway cough syndrome 06/30/2007   Past Medical History:  Past Medical History  Diagnosis Date  . Mild aortic insufficiency 03/28/2006    echo  . H/O chest pain 03/24/2006    normal nuclear  study  . Sinus bradycardia on ECG   . Cancer     right breast mastectomy  . DDD (degenerative disc disease), cervical   . Normal cardiac stress test 2005  . Asthma   . PONV (postoperative nausea and vomiting)   . Hypertension    Past Surgical History:  Past Surgical History  Procedure Laterality Date  . Mastectomy  1993    right breast  . Other surgical history      hysterectomy  . Rectal fissure    . Childbirth      x 2  . Tubal ligation    . Appendectomy      "30 years ago"  . Cholecystectomy      "15 years ago"  . Esophagogastroduodenoscopy N/A 03/04/2014    Procedure: ESOPHAGOGASTRODUODENOSCOPY (EGD);  Surgeon: [name], MD;  Location: [facility] ENDOSCOPY;  Service: Endoscopy;  Laterality: N/A;   HPI:  [name] is a 74 year old female, with hypertension and asthma with a history of vertebral artery  dissection and breast cancer who presents to the emergency department with slurred speech. The patient reports that on Saturday, August 6 she was having a phone conversation with her son when her speech became garbled. Soon afterward the problem resolved but happened again 3-4 times. MRI shows an acute infarction in the left parietal white matter. Orders recieved for cognitive-linguisitc evaluation.   Assessment / Plan / Recommendation Clinical Impression  Cognitive-linguistic evaluation complete.  Oral motor exam is unremarkable, speech is 100% intelligible and receptive/expressive language abilities are intact.  Patient reports that she is at her baseline again but that when her speech goes she is unintelligible.  Given full intelligibility upon eval no skilled SLP services are warranted at this time.  SLP educated patient regarding s/s of stroke which patient reported not knowing speech changes were one symptom; she verbalized understanding at the end of the session.  SLP signing off.        SLP Assessment  Patient does not need any further Speech Lanaguage Pathology Services    Follow Up Recommendations  None            Pertinent Vitals/Pain Pain Assessment: No/denies pain   SLP Goals  Progression toward goals:  (Eval ) Patient/Family Stated Goal: to go home   SLP Evaluation Prior Functioning  Cognitive/Linguistic Baseline: Within functional limits Vocation: Part time employment   Cognition  Overall Cognitive Status: Within Functional Limits for tasks assessed Arousal/Alertness: Awake/alert Orientation Level: Oriented X4 Attention: Selective Selective Attention: Appears intact Memory: Appears intact Awareness: Appears intact Problem Solving: Appears intact Safety/Judgment: Appears intact    Comprehension  Auditory Comprehension Overall Auditory Comprehension: Appears within functional limits for tasks assessed Visual Recognition/Discrimination Discrimination: Within  Function Limits Reading Comprehension Reading Status: Not tested    Expression Expression Primary Mode of Expression: Verbal Verbal Expression Overall Verbal Expression: Appears within functional limits for tasks assessed Written Expression Written Expression: Not tested   Oral / Motor Oral Motor/Sensory Function Overall Oral Motor/Sensory Function: Appears within functional limits for tasks assessed Motor Speech Overall Motor Speech: Appears within functional limits for tasks assessed   GO    Carmelia Roller., CCC-SLP [phone]  [place] 04/28/2015, 3:10 PM

## 2015-04-28 NOTE — Progress Notes (Signed)
  Echocardiogram 2D Echocardiogram has been performed.  Topher Buenaventura 04/28/2015, 11:35 AM

## 2015-04-28 NOTE — Progress Notes (Signed)
SLP Cancellation Note  Patient Details Name: [name] MRN: [id number] DOB: Mar 01, 1941   Cancelled treatment:       Reason Eval/Treat Not Completed: Patient at procedure or test/unavailable.  SLP will follow up as able.   Gunnar Fusi, M.A., CCC-SLP [phone]  [place] 04/28/2015, 11:33 AM

## 2015-04-28 NOTE — Progress Notes (Signed)
PT Cancellation Note  Patient Details Name: [name] MRN: [id number] DOB: 29-Nov-1940   Cancelled Treatment:    Reason Eval/Treat Not Completed: Patient at procedure or test/unavailable;Other (comment) (second attempt)   Ramond Dial 04/28/2015, 3:48 PM   Mee Hives, PT MS Acute Rehab Dept. Number: [facility] [id number] and [place] [phone]

## 2015-04-28 NOTE — Progress Notes (Signed)
Pt's IV was removed clean, dry, intact. Discharge instructions and prescriptions were provided to pt and her husband. All questions and concerns were addressed. Pt's skin was clean, dry, and intact. Pt was instructed to follow up with Neurology.  Patient and husband have no other questions at this time.

## 2015-04-28 NOTE — Discharge Summary (Signed)
Physician Discharge Summary  [name]:[id number] DOB: 04/25/1941 DOA: 04/27/2015  PCP: Donnie Coffin, MD  Admit date: 04/27/2015 Discharge date: 04/28/2015  Time spent: 35 minutes  Recommendations for Outpatient Follow-up:  1. Outpatient neuro follow up 2. Started statin- FLP and LFT follow up 3. HgbA1C pending  Discharge Diagnoses:  Principal Problem:   Stroke Active Problems:   G E R D   PUD (peptic ulcer disease)   HTN (hypertension)   Cephalalgia   Discharge Condition: improved  Diet recommendation: cardiac  Filed Weights   04/27/15 1154 04/27/15 1800  Weight: 68.539 kg (151 lb 1.6 oz) 67.949 kg (149 lb 12.8 oz)    History of present illness:  [name] is a 74 y.o. female, with hypertension and asthma with a history of vertebral artery dissection and breast cancer who presents to the emergency department after being directed here by her neurologist for slurred speech. The patient reports that on Saturday, August 6 she was having a phone conversation with her son when her speech became garbled. Soon afterward the problem resolved but happened again 3-4 days ago. The patient works part-time at [employer] in [employer]. 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 Again the problem resolved. The speech difficulties have happened intermittently several times since. She has also had a severe posterior headache, increased fatigue, and left eye pain for the past 3-4 days. She is concerned because she has missed taking her medications (including Plavix, aspirin, and blood pressure medications) for a day or 2 recently. She denies dizziness, changes in vision, focal weakness in her extremities, fever, recent illness, vomiting, changes in bowel habits, dysuria, chest pain.  MRI done in the ER shows a subcentimeter acute infarction in the left parietal white matter. Labs are otherwise normal other than a slightly elevated serum glucose   Hospital  Course:  Stroke: left parietal subcortical white matter infarct secondary to intracranial atherosclerosis progression in L M2 since March 2016  MRI L parietal subcortical white matter infarct. small vessel disease   MRA Mid BA occlusion, diseased distal R VA. Diseased or occluded L M2.  Carotid Doppler No significant stenosis  2D Echo ok   LDL 124- high dose statin  HgbA1c pending  Lovenox 40 mg sq daily for VTE prophylaxis  aspirin 81 mg orally every day and clopidogrel 75 mg orally every day prior to admission, now on aspirin 325 mg orally every day and clopidogrel 75 mg orally every day  Patient counseled to be compliant with her antithrombotic medications  Ongoing aggressive stroke risk factor management  Therapy recommendations: no need as patient ambulatory  Follow up with Dr. Erlinda Hong at [facility] (does not want to see Dr. Brett Fairy or Jinny Blossom)  [facility] for discharge per neuro  Follow up Dr. Erlinda Hong in 2 months  Hypertension  Permissive hypertension (OK if < 220/120) but gradually normalize in 5-7 days  Hyperlipidemia  Home meds: No statin  LDL 124, goal < 70  Add statin - lipitor 80 mg daily  Continue statin at discharge No need for PT as patient ambulatory  Procedures:  echo  Consultations:  neuro  Discharge Exam: Filed Vitals:   04/28/15 1011  BP: 146/66  Pulse: 62  Temp: 97.8 F (36.6 C)  Resp: 16    General: A+Ox3, NAD   Discharge Instructions   Discharge Instructions    Ambulatory referral to Neurology    Complete by:  As directed   Dr. Erlinda Hong requests  followup in 2 months. Pt has seen Dohmeier and Megan in the past. She no longer wants to follow up with them. Dr. Erlinda Hong said he would follow up with her.          Current Discharge Medication List    CONTINUE these medications which have NOT CHANGED   Details  amLODipine (NORVASC) 5 MG tablet Take 5 mg by mouth daily.    aspirin EC 81 MG tablet Take 1 tablet (81 mg total) by mouth daily. Qty:  30 tablet, Refills: 6    clopidogrel (PLAVIX) 75 MG tablet Take 1 tablet (75 mg total) by mouth daily. Qty: 30 tablet, Refills: 6    loratadine (CLARITIN) 10 MG tablet Take 10 mg by mouth daily.    nortriptyline (PAMELOR) 10 MG capsule Take 2 capsules (20 mg total) by mouth at bedtime. Qty: 60 capsule, Refills: 5    albuterol (PROAIR HFA) 108 (90 BASE) MCG/ACT inhaler Inhale 2 puffs into the lungs every 6 (six) hours as needed for wheezing or shortness of breath.       Allergies  Allergen Reactions  . Doxycycline     REACTION: GI upset  . Percocet [Oxycodone-Acetaminophen] Nausea And Vomiting   Follow-up Information    Follow up with Xu,Jindong, MD In 2 months.   Specialty:  Neurology   Why:  Stroke Clinic, Office will call you with appointment date & time   Contact information:   [address] [place] [address] [phone]        The results of significant diagnostics from this hospitalization (including imaging, microbiology, ancillary and laboratory) are listed below for reference.    Significant Diagnostic Studies: Dg Chest 2 View  04/27/2015   CLINICAL DATA:  Stroke  EXAM: CHEST  2 VIEW  COMPARISON:  03/02/2014  FINDINGS: Lungs are clear.  No pleural effusion or pneumothorax.  The heart is normal in size.  Degenerative changes of the visualized thoracolumbar spine.  Status post right mastectomy with right axillary lymph node dissection.  IMPRESSION: No evidence of acute cardiopulmonary disease.   Electronically Signed   By: Julian Hy M.D.   On: 04/27/2015 18:38   [name] Contrast  04/27/2015   CLINICAL DATA:  Slurred speech developed 3 days ago. Difficulty word forming. Headache.  EXAM: MRI HEAD WITHOUT CONTRAST  TECHNIQUE: Multiplanar, multiecho pulse sequences of the brain and surrounding structures were obtained without intravenous contrast.  COMPARISON:  Head CT 11/21/2014.  MRI 06/30/2014.  FINDINGS: Diffusion imaging shows a sub cm  acute infarction in the left parietal subcortical white matter. No large vessel territory infarction. The brainstem and cerebellum are normal. The cerebral hemispheres show moderate chronic small-vessel ischemic changes throughout the white matter. No mass lesion, hemorrhage, hydrocephalus or extra-axial collection. No pituitary mass. No inflammatory sinus disease. No skull or skullbase lesion. Major vessels at the base of the brain show flow.  IMPRESSION: Sub cm acute infarction in the left parietal subcortical white matter. No hemorrhage, swelling or mass effect.  Moderate chronic small vessel ischemic changes elsewhere throughout the brain.  These results will be called to the ordering clinician or representative by the Radiologist Assistant, and communication documented in the PACS or zVision Dashboard.   Electronically Signed   By: Nelson Chimes M.D.   On: 04/27/2015 15:24   [name] Head/brain Wo Cm  04/27/2015   CLINICAL DATA:  Intermittent slurred speech beginning 3-4 days ago. History of vertebral artery dissection. MRI demonstrated acute LEFT  parietal infarct.  EXAM: MRA HEAD WITHOUT CONTRAST  TECHNIQUE: Angiographic images of the Circle of Willis were obtained using MRA technique without intravenous contrast.  COMPARISON:  MRI brain earlier today. MRI brain 06/30/14. CTA head and neck 06/30/14.  FINDINGS: The internal carotid arteries demonstrate dolichoectasia but otherwise widely patent. The A1 segment of the LEFT anterior cerebral artery is hypoplastic or severely diseased, and does not contribute appreciably to the anterior cerebral flow. The RIGHT A1 ACA is robust and supplies both distal anterior cerebral is. Mild disease in the LEFT A2 segment proximally.  Widely patent RIGHT M1 MCA and RIGHT MCA bifurcation branches. Slightly narrowed distal M1 MCA on the LEFT. Severely diseased or occluded LEFT MCA M2 trifurcation branch, with suspected abrupt termination image 134 series 2.  LEFT vertebral  appears to terminate in the LEFT PICA. RIGHT vertebral is the apparent dominant contributor to the basilar, severely diseased in its distal V4 segment. Lack of flow related enhancement in the proximal to mid basilar correlates with prior CTA demonstrating a mid basilar occlusion. Robust posterior cerebral arteries may be fetal in origin, but contribute to distal basilar flow via posterior communicating arteries. The PICA and superior cerebellar arteries are patent. Nonvisualized AICAs. No intracranial aneurysm.  IMPRESSION: Mid basilar occlusion is probably stable from priors. Severely diseased distal RIGHT vertebral is redemonstrated.  Diseased or occluded LEFT M2 MCA trifurcation branch appears qualitatively different when compared with similar area on prior CTA.  The observed pattern of infarction in the LEFT parietal subcortical white matter could represent a watershed insult.   Electronically Signed   By: Staci Righter M.D.   On: 04/27/2015 18:49    Microbiology: No results found for this or any previous visit (from the past 240 hour(s)).   Labs: Basic Metabolic Panel:  Recent Labs Lab 04/27/15 1205  NA 140  K 4.1  CL 104  CO2 26  GLUCOSE 149*  BUN 14  CREATININE 0.81  CALCIUM 9.3   Liver Function Tests:  Recent Labs Lab 04/27/15 1205  AST 19  ALT 19  ALKPHOS 78  BILITOT 0.3  PROT 6.0*  ALBUMIN 3.7   No results for input(s): LIPASE, AMYLASE in the last 168 hours. No results for input(s): AMMONIA in the last 168 hours. CBC:  Recent Labs Lab 04/27/15 1205  WBC 6.1  NEUTROABS 3.8  HGB 13.1  HCT 37.5  MCV 88.4  PLT 162   Cardiac Enzymes: No results for input(s): CKTOTAL, CKMB, CKMBINDEX, TROPONINI in the last 168 hours. BNP: BNP (last 3 results) No results for input(s): BNP in the last 8760 hours.  ProBNP (last 3 results) No results for input(s): PROBNP in the last 8760 hours.  CBG: No results for input(s): GLUCAP in the last 168  hours.     SignedEulogio Bear  [facility] 04/28/2015, 2:12 PM

## 2015-04-28 NOTE — Progress Notes (Signed)
Utilization Review completed. Tayvien Kane RN BSN CM 

## 2015-04-28 NOTE — Progress Notes (Signed)
PT Cancellation Note  Patient Details Name: [name] MRN: [id number] DOB: 09/08/1941   Cancelled Treatment:    Reason Eval/Treat Not Completed: Patient at procedure or test/unavailable.  Retry as time allows.   Ramond Dial 04/28/2015, 10:39 AM   Mee Hives, PT MS Acute Rehab Dept. Number: [facility] [id number] and [place] [phone]

## 2015-04-28 NOTE — Care Management Note (Signed)
Case Management Note  Patient Details  Name: REHA MARTINOVICH MRN: [id number] Date of Birth: 07/16/1941  Subjective/Objective:                 Spoke with patient and family at bedside. Patient from home with husband, independent, drives, works part time. Patient anticipates discharge to home. Admitted with CVA.    Action/Plan:  Will continue to follow and offer resources and [facility] as needed.  Expected Discharge Date:                  Expected Discharge Plan:  Home/Self Care  In-House Referral:     Discharge planning Services  CM Consult  Post Acute Care Choice:    Choice offered to:     DME Arranged:    DME Agency:     HH Arranged:    HH Agency:     Status of Service:  In process, will continue to follow  Medicare Important Message Given:    Date Medicare IM Given:    Medicare IM give by:    Date Additional Medicare IM Given:    Additional Medicare Important Message give by:     If discussed at [place] of Stay Meetings, dates discussed:    Additional Comments:  Carles Collet, RN 04/28/2015, 2:53 PM

## 2015-04-28 NOTE — Progress Notes (Signed)
STROKE TEAM PROGRESS NOTE   HISTORY [name] is an 74 y.o. female with a past medical history that is relevant for HTN,vertebral artery dissection, vertigo, migraines breast cancer s/p right mastectomy, and asthma, admitted to [facility] for evaluation of acute left parietal infarct. Patient reports having episodes of getting words out, " my words were not coming out correctly". She said that in addition to that she has been having head pain for several days. She denies associated vertigo, double vision, difficulty swallowing, focal weakness or numbness, imbalance, confusion, or vision impairment. Stated that she was advised to take aspirin and plavix in combination after she was found to have a vertebral artery dissection few months ago but this is causing her to bruise easily and for that reason she stopped it for a short while and resumed it taking both one week ago. MRI.MRA brain performed today were reviewed and showed a sub cm acute infarction in the left parietal subcortical white matter as well as mid basilar occlusion, severely diseased distal right VA, and diseased or occluded LEFT M2 MCA trifurcation branch. Last known well is uncertain. Patient was not administered TPA secondary to late presentation. She was admitted for further evaluation and treatment.   SUBJECTIVE (INTERVAL HISTORY) Her multiple family members are at the bedside (6 people).  Overall she feels her condition is stable. Patient requests that she be followed up in the office, not by a NP but by other MD. She has seen Dr. Brett Fairy in the past, she wants to follow with Dr. Erlinda Hong.  OBJECTIVE Temp:  [97.3 F (36.3 C)-98.2 F (36.8 C)] 97.8 F (36.6 C) (08/16 1011) Pulse Rate:  [60-73] 62 (08/16 1011) Cardiac Rhythm:  [-] Normal sinus rhythm (08/15 1940) Resp:  [12-22] 16 (08/16 1011) BP: (127-180)/(53-78) 146/66 mmHg (08/16 1011) SpO2:  [96 %-100 %] 96 % (08/16 1011) Weight:  [67.949 kg (149 lb 12.8 oz)] 67.949 kg (149 lb 12.8  oz) (08/15 1800)  No results for input(s): GLUCAP in the last 168 hours.  Recent Labs Lab 04/27/15 1205  NA 140  K 4.1  CL 104  CO2 26  GLUCOSE 149*  BUN 14  CREATININE 0.81  CALCIUM 9.3    Recent Labs Lab 04/27/15 1205  AST 19  ALT 19  ALKPHOS 78  BILITOT 0.3  PROT 6.0*  ALBUMIN 3.7    Recent Labs Lab 04/27/15 1205  WBC 6.1  NEUTROABS 3.8  HGB 13.1  HCT 37.5  MCV 88.4  PLT 162   No results for input(s): CKTOTAL, CKMB, CKMBINDEX, TROPONINI in the last 168 hours.  Recent Labs  04/27/15 1205  LABPROT 13.5  INR 1.01   No results for input(s): COLORURINE, LABSPEC, PHURINE, GLUCOSEU, HGBUR, BILIRUBINUR, KETONESUR, PROTEINUR, UROBILINOGEN, NITRITE, LEUKOCYTESUR in the last 72 hours.  Invalid input(s): APPERANCEUR     Component Value Date/Time   CHOL 220* 04/28/2015 0645   TRIG 301* 04/28/2015 0645   HDL 36* 04/28/2015 0645   CHOLHDL 6.1 04/28/2015 0645   VLDL 60* 04/28/2015 0645   LDLCALC 124* 04/28/2015 0645   No results found for: HGBA1C    Component Value Date/Time   LABOPIA NONE DETECTED 06/30/2014 1930   COCAINSCRNUR NONE DETECTED 06/30/2014 1930   LABBENZ NONE DETECTED 06/30/2014 1930   AMPHETMU NONE DETECTED 06/30/2014 1930   THCU NONE DETECTED 06/30/2014 1930   LABBARB NONE DETECTED 06/30/2014 1930    No results for input(s): ETH in the last 168 hours.   IMAGING  Dg Chest 2  View 04/27/2015   No evidence of acute cardiopulmonary disease.     [name] Contrast 04/27/2015   Sub cm acute infarction in the left parietal subcortical white matter. No hemorrhage, swelling or mass effect.  Moderate chronic small vessel ischemic changes elsewhere throughout the brain.    [name] Head/brain Wo Cm 04/27/2015   Mid basilar occlusion is probably stable from priors. Severely diseased distal RIGHT vertebral is redemonstrated.  Diseased or occluded LEFT M2 MCA trifurcation branch appears qualitatively different when compared with similar area on prior  CTA.  The observed pattern of infarction in the LEFT parietal subcortical white matter could represent a watershed insult.     CUS - There is 1-39% bilateral ICA stenosis. Vertebral artery flow is antegrade.    2D echo - - Normal LV size with mild LV hypertrophy. EF 55-60%. Normal RV size and systolic function. Mild AI.  PHYSICAL EXAM Physical exam  Temp:  [97.3 F (36.3 C)-98.2 F (36.8 C)] 97.8 F (36.6 C) (08/16 1011) Pulse Rate:  [61-66] 62 (08/16 1011) Resp:  [12-16] 16 (08/16 1011) BP: (142-180)/(59-66) 146/66 mmHg (08/16 1011) SpO2:  [96 %-100 %] 96 % (08/16 1011)  General - Well nourished, well developed, in no apparent distress.  Ophthalmologic - Sharp disc margins OU. F  Cardiovascular - Regular rate and rhythm with no murmur.  Mental Status -  Level of arousal and orientation to time, place, and person were intact. Language including expression, naming, repetition, comprehension was assessed and found intact. Fund of Knowledge was assessed and was intact.  Cranial Nerves II - XII - II - Visual field intact OU. III, IV, VI - Extraocular movements intact. V - Facial sensation intact bilaterally. VII - Facial movement intact bilaterally. VIII - Hearing & vestibular intact bilaterally. X - Palate elevates symmetrically. XI - Chin turning & shoulder shrug intact bilaterally. XII - Tongue protrusion intact.  Motor Strength - The patient's strength was normal in all extremities and pronator drift was absent.  Bulk was normal and fasciculations were absent.   Motor Tone - Muscle tone was assessed at the neck and appendages and was normal.  Reflexes - The patient's reflexes were 1+ in all extremities and she had no pathological reflexes.  Sensory - Light touch, temperature/pinprick were assessed and were symmetrical.    Coordination - The patient had normal movements in the hands and feet with no ataxia or dysmetria.  Tremor was absent.  Gait and Station - The  patient's transfers, posture, gait, station, and turns were observed as normal.   ASSESSMENT/PLAN Ms. [name] is a 74 y.o. female with history of HTN, vertebral artery stenosis vs. dissection, vertigo, migraines, breast cancer s/p right mastectomy, and asthma presenting with slurred speech. She did not receive IV t-PA due to delay in arrival.   Stroke:  left parietal subcortical white matter infarct secondary to intracranial atherosclerosis progression in L M2 since March 2016  MRI  L parietal subcortical white matter infarct.   MRA  Mid BA occlusion, diseased distal R VA. Occluded L M2 which is progressed from 11/2014 CTA   Carotid Doppler  No significant stenosis   2D Echo  unremarkable   LDL 124, not at goal  HgbA1c pending  Lovenox 40 mg sq daily for VTE prophylaxis  aspirin 81 mg orally every day and clopidogrel 75 mg orally every day prior to admission, now on aspirin 325 mg orally every day and clopidogrel 75 mg orally every day  Patient counseled to be compliant with her antithrombotic medications  Ongoing aggressive stroke risk factor management  Therapy recommendations:  pending   Disposition:  pending (anticipate return home)  Follow up with Dr. Erlinda Hong at [facility] (does not want to see Dr. Brett Fairy or Jinny Blossom)  [facility] for discharge once stroke work up completed  Follow up Dr. Erlinda Hong in 2 months  Intracranial stenosis  Left M2 occlusion which is progressed from 11/2014 CTA which showed stenosis of left M2  Right VA high grade stenosis distally.  Left VA non dominant and irregularity question dissection vs. Stenosis  Mid BA occlusion, bilateral PCOM patent to supply b/l PCAs  Need maximized medical therapy with dural antiplatelet and high dose statin  Close observation  If recurrent stroke, may consider stenting  BP goal 120-150  Hypertension Permissive hypertension (OK if < 220/120) but gradually normalize in 5-7 days Long term BP goal 120-150 due to intracranial  athero On norvasc 5mg    Hyperlipidemia  Home meds:  No statin  LDL 124, goal < 70  Add statin - lipitor 80 mg daily  Continue statin at discharge  Other Stroke Risk Factors  Advanced age  Other Active Problems  GERD  Migraine HA - continue nortriptyline  Hospital day # 1  Neurology will sign off. Please call with questions. Pt will follow up with Dr. Erlinda Hong at [facility] in about 2 months. Thanks for the consult.  Rosalin Hawking, MD PhD Stroke Neurology 04/28/2015 10:52 PM   To contact Stroke Continuity provider, please refer to [email or web address] After hours, contact General Neurology

## 2015-04-28 NOTE — Progress Notes (Signed)
*  PRELIMINARY RESULTS* Vascular Ultrasound Carotid Duplex (Doppler) has been completed.   Findings suggest 1-39% internal carotid artery stenosis bilaterally. Vertebral arteries are patent with antegrade flow.  04/28/2015 11:07 AM Maudry Mayhew, RVT, RDCS, RDMS

## 2015-04-29 LAB — HEMOGLOBIN A1C
HEMOGLOBIN A1C: 5.9 % — AB (ref 4.8–5.6)
MEAN PLASMA GLUCOSE: 123 mg/dL

## 2015-05-03 ENCOUNTER — Emergency Department (HOSPITAL_COMMUNITY): Payer: Medicare Other

## 2015-05-03 ENCOUNTER — Emergency Department (HOSPITAL_COMMUNITY)
Admission: EM | Admit: 2015-05-03 | Discharge: 2015-05-03 | Disposition: A | Discharge status: Home / Self Care | Payer: Medicare Other | Attending: Emergency Medicine | Admitting: Emergency Medicine

## 2015-05-03 ENCOUNTER — Encounter (HOSPITAL_COMMUNITY): Payer: Self-pay | Admitting: *Deleted

## 2015-05-03 DIAGNOSIS — I639 Cerebral infarction, unspecified: Secondary | ICD-10-CM | POA: Diagnosis not present

## 2015-05-03 DIAGNOSIS — R471 Dysarthria and anarthria: Secondary | ICD-10-CM | POA: Insufficient documentation

## 2015-05-03 DIAGNOSIS — Z79899 Other long term (current) drug therapy: Secondary | ICD-10-CM | POA: Insufficient documentation

## 2015-05-03 DIAGNOSIS — Z853 Personal history of malignant neoplasm of breast: Secondary | ICD-10-CM | POA: Diagnosis not present

## 2015-05-03 DIAGNOSIS — Z7902 Long term (current) use of antithrombotics/antiplatelets: Secondary | ICD-10-CM | POA: Diagnosis not present

## 2015-05-03 DIAGNOSIS — I10 Essential (primary) hypertension: Secondary | ICD-10-CM | POA: Insufficient documentation

## 2015-05-03 DIAGNOSIS — R4781 Slurred speech: Secondary | ICD-10-CM | POA: Diagnosis not present

## 2015-05-03 DIAGNOSIS — Z7982 Long term (current) use of aspirin: Secondary | ICD-10-CM | POA: Diagnosis not present

## 2015-05-03 DIAGNOSIS — J45909 Unspecified asthma, uncomplicated: Secondary | ICD-10-CM | POA: Diagnosis not present

## 2015-05-03 DIAGNOSIS — G459 Transient cerebral ischemic attack, unspecified: Secondary | ICD-10-CM

## 2015-05-03 DIAGNOSIS — R4701 Aphasia: Secondary | ICD-10-CM | POA: Diagnosis present

## 2015-05-03 LAB — COMPREHENSIVE METABOLIC PANEL
ALT: 42 U/L (ref 14–54)
ANION GAP: 9 (ref 5–15)
AST: 38 U/L (ref 15–41)
Albumin: 4.4 g/dL (ref 3.5–5.0)
Alkaline Phosphatase: 94 U/L (ref 38–126)
BILIRUBIN TOTAL: 0.9 mg/dL (ref 0.3–1.2)
BUN: 15 mg/dL (ref 6–20)
CALCIUM: 9.4 mg/dL (ref 8.9–10.3)
CO2: 24 mmol/L (ref 22–32)
Chloride: 103 mmol/L (ref 101–111)
Creatinine, Ser: 0.99 mg/dL (ref 0.44–1.00)
GFR calc Af Amer: >60 mL/min (ref >60)
GFR, EST NON AFRICAN AMERICAN: 54 mL/min — AB (ref >60)
Glucose, Bld: 109 mg/dL — ABNORMAL HIGH (ref 65–99)
POTASSIUM: 3.7 mmol/L (ref 3.5–5.1)
Sodium: 136 mmol/L (ref 135–145)
TOTAL PROTEIN: 7.3 g/dL (ref 6.5–8.1)

## 2015-05-03 LAB — I-STAT CHEM 8, ED
BUN: 18 mg/dL (ref 6–20)
CALCIUM ION: 1.28 mmol/L (ref 1.13–1.30)
CREATININE: 1 mg/dL (ref 0.44–1.00)
Chloride: 103 mmol/L (ref 101–111)
Glucose, Bld: 110 mg/dL — ABNORMAL HIGH (ref 65–99)
HEMATOCRIT: 48 % — AB (ref 36.0–46.0)
HEMOGLOBIN: 16.3 g/dL — AB (ref 12.0–15.0)
Potassium: 3.7 mmol/L (ref 3.5–5.1)
Sodium: 140 mmol/L (ref 135–145)
TCO2: 22 mmol/L (ref 0–100)

## 2015-05-03 LAB — CBC
HEMATOCRIT: 42.7 % (ref 36.0–46.0)
HEMOGLOBIN: 15.3 g/dL — AB (ref 12.0–15.0)
MCH: 31.2 pg (ref 26.0–34.0)
MCHC: 35.8 g/dL (ref 30.0–36.0)
MCV: 87 fL (ref 78.0–100.0)
Platelets: 197 10*3/uL (ref 150–400)
RBC: 4.91 MIL/uL (ref 3.87–5.11)
RDW: 13 % (ref 11.5–15.5)
WBC: 7 10*3/uL (ref 4.0–10.5)

## 2015-05-03 LAB — DIFFERENTIAL
Basophils Absolute: 0 10*3/uL (ref 0.0–0.1)
Basophils Relative: 0 % (ref 0–1)
EOS ABS: 0.1 10*3/uL (ref 0.0–0.7)
EOS PCT: 2 % (ref 0–5)
LYMPHS ABS: 1.5 10*3/uL (ref 0.7–4.0)
Lymphocytes Relative: 22 % (ref 12–46)
MONOS PCT: 6 % (ref 3–12)
Monocytes Absolute: 0.4 10*3/uL (ref 0.1–1.0)
Neutro Abs: 4.9 10*3/uL (ref 1.7–7.7)
Neutrophils Relative %: 70 % (ref 43–77)

## 2015-05-03 LAB — I-STAT TROPONIN, ED: TROPONIN I, POC: 0 ng/mL (ref 0.00–0.08)

## 2015-05-03 LAB — APTT: aPTT: 28 seconds (ref 24–37)

## 2015-05-03 LAB — PROTIME-INR
INR: 1.04 (ref 0.00–1.49)
Prothrombin Time: 13.8 seconds (ref 11.6–15.2)

## 2015-05-03 NOTE — ED Notes (Signed)
Pt placed into gown and on monitor upon arrival to room. Pt monitored by blood pressure, pulse ox, and 5 lead. pts family at bedside.

## 2015-05-03 NOTE — Discharge Instructions (Signed)
Follow-up with the neurologist as you have been instructed

## 2015-05-03 NOTE — Consult Note (Signed)
Referring Physician: ED    Chief Complaint: transient dysarthria, HA, sleepiness.   HPI:                                                                                                                                         [name] is an 74 y.o. female with a past medical history that is relevant for HTN,vertebral artery dissection, vertigo, migraines breast cancer s/p right mastectomy, and asthma, released from [facility] just 1 week ago after sustaining a small acute left parietal infarct, returns to ED today for evaluation of the aforementioned symptoms. She indicated that since discharge from the hospital she has been experiencing transient episodes of slurred speech lasting from few seconds to a couple of minutes. Today, her family became concerned because she had another episode that this time was associated with increased sleepiness, she put her head down and they were afraid that she was having another stroke. Patient denies associated vertigo, double vision, focal weakness or numbness, language or speech impairment. She has been taking aspirin, plavix, and statin faithfully. MRI brain performed today was personally reviewed and showed no acute intracranial abnormality, perhaps slight increase in side of recent left parietal infarct. It it worth mentioning that patient has been under significant stress due to death of close family members in the last days.    Date last known well: 05/03/15 Time last known well: uncertain tPA Given: no, symptoms resolved   Past Medical History  Diagnosis Date  . Mild aortic insufficiency 03/28/2006    echo  . H/O chest pain 03/24/2006    normal nuclear  study  . Sinus bradycardia on ECG   . Cancer     right breast mastectomy  . DDD (degenerative disc disease), cervical   . Normal cardiac stress test 2005  . Asthma   . PONV (postoperative nausea and vomiting)   . Hypertension     Past Surgical History  Procedure Laterality Date  . Mastectomy   1993    right breast  . Other surgical history      hysterectomy  . Rectal fissure    . Childbirth      x 2  . Tubal ligation    . Appendectomy      "30 years ago"  . Cholecystectomy      "15 years ago"  . Esophagogastroduodenoscopy N/A 03/04/2014    Procedure: ESOPHAGOGASTRODUODENOSCOPY (EGD);  Surgeon: [name], MD;  Location: [facility] ENDOSCOPY;  Service: Endoscopy;  Laterality: N/A;    Family History  Problem Relation Age of Onset  . Heart attack Mother   . Heart disease Mother   . Hypertension Mother   . Heart attack Father   . Heart disease Father   . Hypertension Sister   . Heart attack Sister   . Hypertension Sister   . Heart attack Sister    Social History:  reports  that she has never smoked. She has never used smokeless tobacco. She reports that she does not drink alcohol or use illicit drugs. Family history: no MS, epilepsy, brain tumor, or brain aneurysm Allergies:  Allergies  Allergen Reactions  . Doxycycline Nausea And Vomiting    REACTION: GI upset  . Percocet [Oxycodone-Acetaminophen] Nausea And Vomiting    Medications:                                                                                                                           I have reviewed the patient's current medications.  ROS:                                                                                                                                       History obtained from family, chart review and the patient  General ROS: negative for - chills, fatigue, fever, night sweats, weight gain or weight loss Psychological ROS: negative for - behavioral disorder, hallucinations, memory difficulties, mood swings or suicidal ideation Ophthalmic ROS: negative for - blurry vision, double vision, eye pain or loss of vision ENT ROS: negative for - epistaxis, nasal discharge, oral lesions, sore throat, tinnitus or vertigo Allergy and Immunology ROS: negative for - hives or itchy/watery  eyes Hematological and Lymphatic ROS: negative for - bleeding problems, bruising or swollen lymph nodes Endocrine ROS: negative for - galactorrhea, hair pattern changes, polydipsia/polyuria or temperature intolerance Respiratory ROS: negative for - cough, hemoptysis, shortness of breath or wheezing Cardiovascular ROS: negative for - chest pain, dyspnea on exertion, edema or irregular heartbeat Gastrointestinal ROS: negative for - abdominal pain, hematemesis, nausea/vomiting or stool incontinence Genito-Urinary ROS: negative for - dysuria, hematuria, incontinence or urinary frequency/urgency Musculoskeletal ROS: negative for - joint swelling or muscular weakness Neurological ROS: as noted in HPI Dermatological ROS: negative for rash and skin lesion changes    Physical exam:  Constitutional: well developed, pleasant female in no apparent distress. Blood pressure 139/58, pulse 62, temperature 98.3 F (36.8 C), temperature source Oral, resp. rate 17, SpO2 98 %. Eyes: no jaundice or exophthalmos.  Head: normocephalic. Neck: supple, no bruits, no JVD. Cardiac: no murmurs. Lungs: clear. Abdomen: soft, no tender, no mass. Extremities: no edema, clubbing, or cyanosis.  Skin: no rash   Neurologic Examination:  General: Mental Status: Alert, oriented, thought content appropriate.  Speech fluent without evidence of aphasia.  Able to follow 3 step commands without difficulty. Cranial Nerves: II: Discs flat bilaterally; Visual fields grossly normal, pupils equal, round, reactive to light and accommodation III,IV, VI: ptosis not present, extra-ocular motions intact bilaterally V,VII: smile symmetric, facial light touch sensation normal bilaterally VIII: hearing normal bilaterally IX,X: uvula rises symmetrically XI: bilateral shoulder shrug XII: midline tongue extension without atrophy or  fasciculations  Motor: Right : Upper extremity   5/5    Left:     Upper extremity   5/5  Lower extremity   5/5     Lower extremity   5/5 Tone and bulk:normal tone throughout; no atrophy noted Sensory: Pinprick and light touch intact throughout, bilaterally Deep Tendon Reflexes:  1+ all over Plantars: Right: downgoing   Left: downgoing Cerebellar: normal finger-to-nose,  normal heel-to-shin test Gait:  No tested due to multiple leads  Results for orders placed or performed during the hospital encounter of 05/03/15 (from the past 48 hour(s))  I-Stat Chem 8, ED  (not at [facility], [facility])     Status: Abnormal   Collection Time: 05/03/15  4:48 PM  Result Value Ref Range   Sodium 140 135 - 145 mmol/L   Potassium 3.7 3.5 - 5.1 mmol/L   Chloride 103 101 - 111 mmol/L   BUN 18 6 - 20 mg/dL   Creatinine, Ser 1.00 0.44 - 1.00 mg/dL   Glucose, Bld 110 (H) 65 - 99 mg/dL   Calcium, Ion 1.28 1.13 - 1.30 mmol/L   TCO2 22 0 - 100 mmol/L   Hemoglobin 16.3 (H) 12.0 - 15.0 g/dL   HCT 48.0 (H) 36.0 - 46.0 %  I-stat troponin, ED (not at [facility], [facility])     Status: None   Collection Time: 05/03/15  5:03 PM  Result Value Ref Range   Troponin i, poc 0.00 0.00 - 0.08 ng/mL   Comment 3            Comment: Due to the release kinetics of cTnI, a negative result within the first hours of the onset of symptoms does not rule out myocardial infarction with certainty. If myocardial infarction is still suspected, repeat the test at appropriate intervals.   Protime-INR     Status: None   Collection Time: 05/03/15  5:10 PM  Result Value Ref Range   Prothrombin Time 13.8 11.6 - 15.2 seconds   INR 1.04 0.00 - 1.49  APTT     Status: None   Collection Time: 05/03/15  5:10 PM  Result Value Ref Range   aPTT 28 24 - 37 seconds  CBC     Status: Abnormal   Collection Time: 05/03/15  5:10 PM  Result Value Ref Range   WBC 7.0 4.0 - 10.5 K/uL   RBC 4.91 3.87 - 5.11 MIL/uL   Hemoglobin 15.3 (H) 12.0 - 15.0 g/dL   HCT 42.7  36.0 - 46.0 %   MCV 87.0 78.0 - 100.0 fL   MCH 31.2 26.0 - 34.0 pg   MCHC 35.8 30.0 - 36.0 g/dL   RDW 13.0 11.5 - 15.5 %   Platelets 197 150 - 400 K/uL  Differential     Status: None   Collection Time: 05/03/15  5:10 PM  Result Value Ref Range   Neutrophils Relative % 70 43 - 77 %   Neutro Abs 4.9 1.7 - 7.7 K/uL   Lymphocytes Relative 22 12 - 46 %  Lymphs Abs 1.5 0.7 - 4.0 K/uL   Monocytes Relative 6 3 - 12 %   Monocytes Absolute 0.4 0.1 - 1.0 K/uL   Eosinophils Relative 2 0 - 5 %   Eosinophils Absolute 0.1 0.0 - 0.7 K/uL   Basophils Relative 0 0 - 1 %   Basophils Absolute 0.0 0.0 - 0.1 K/uL  Comprehensive metabolic panel     Status: Abnormal   Collection Time: 05/03/15  5:10 PM  Result Value Ref Range   Sodium 136 135 - 145 mmol/L   Potassium 3.7 3.5 - 5.1 mmol/L   Chloride 103 101 - 111 mmol/L   CO2 24 22 - 32 mmol/L   Glucose, Bld 109 (H) 65 - 99 mg/dL   BUN 15 6 - 20 mg/dL   Creatinine, Ser 0.99 0.44 - 1.00 mg/dL   Calcium 9.4 8.9 - 10.3 mg/dL   Total Protein 7.3 6.5 - 8.1 g/dL   Albumin 4.4 3.5 - 5.0 g/dL   AST 38 15 - 41 U/L   ALT 42 14 - 54 U/L   Alkaline Phosphatase 94 38 - 126 U/L   Total Bilirubin 0.9 0.3 - 1.2 mg/dL   GFR calc non Af Amer 54 (L) >60 mL/min   GFR calc Af Amer >60 >60 mL/min    Comment: (NOTE) The eGFR has been calculated using the CKD EPI equation. This calculation has not been validated in all clinical situations. eGFR's persistently <60 mL/min signify possible Chronic Kidney Disease.    Anion gap 9 5 - 15   [name] Contrast  05/03/2015   CLINICAL DATA:  TIA. Recent stroke. New onset of aphasia and headache today.  EXAM: MRI HEAD WITHOUT CONTRAST  TECHNIQUE: Multiplanar, multiecho pulse sequences of the brain and surrounding structures were obtained without intravenous contrast.  COMPARISON:  MRI 04/27/2015  FINDINGS: Sub cm area of restricted diffusion in the left parietal white matter similar to the prior study. This may shows slight  progression in size. No other areas of restricted diffusion are identified.  Mild to moderate chronic microvascular ischemic change in the cerebral white matter. Brainstem and cerebellum intact.  Negative for hemorrhage or mass lesion  Ventricle size normal.  No shift of the midline structures.  Paranasal sinuses clear.  Normal orbit.  Normal pituitary size  IMPRESSION: Subacute infarct left parietal white matter is similar to the prior study and may show slight interval enlargement. No other areas of acute infarct.   Electronically Signed   By: Franchot Gallo M.D.   On: 05/03/2015 20:25    Assessment: 74 y.o. female presents with complains of recurrent, very transient episodes of slurred speech that today was associated with HA and increased sleepiness. Non focal neuro-exam. MRI brain revealed no new stroke. Patient had complete stroke work up a week ago, is on dual antiplatelet therapy and statins. Lot of stress due to death in the family. No further neurological intervention needed at this moment. Continue current secondary stroke prevention regimen.  Stroke Risk Factors - age, HTN,vertebral artery dissection,   Dorian Pod, MD [facility] [phone]  05/03/2015, 8:42 PM

## 2015-05-03 NOTE — ED Notes (Signed)
Pt transported to MRI 

## 2015-05-03 NOTE — ED Notes (Signed)
Dr Allen at bedside  

## 2015-05-03 NOTE — ED Notes (Signed)
Pt states that she suffered a stroke on Tuesday, symptoms were headache and aphasia. States that she was discharged on Wednesday with no residual symptoms. Pt states that she has been under a lot of stress with her family and this morning at [place] suffered a short period of aphasia and headache. Symptoms have resolved.

## 2015-05-03 NOTE — ED Provider Notes (Signed)
CSN: [id number]     Arrival date & time 05/03/15  1532 History   First MD Initiated Contact with Patient 05/03/15 1747     Chief Complaint  Patient presents with  . Aphasia     (Consider location/radiation/quality/duration/timing/severity/associated sxs/prior Treatment) HPI Comments: Patient here complaining of intermittent expressive aphasia which began today. Recently discharged after having a stroke. Denies any focal weakness. No confusion noted. Patient states that she can fill when the symptoms started and described as trouble getting worse. Symptoms last less than a minute. Nothing exacerbates this. Nothing makes it better. She says that she is unsure if this is similar to what she had when she had a prior CVA symptoms. Denies any somatic complaints  The history is provided by the patient.    Past Medical History  Diagnosis Date  . Mild aortic insufficiency 03/28/2006    echo  . H/O chest pain 03/24/2006    normal nuclear  study  . Sinus bradycardia on ECG   . Cancer     right breast mastectomy  . DDD (degenerative disc disease), cervical   . Normal cardiac stress test 2005  . Asthma   . PONV (postoperative nausea and vomiting)   . Hypertension    Past Surgical History  Procedure Laterality Date  . Mastectomy  1993    right breast  . Other surgical history      hysterectomy  . Rectal fissure    . Childbirth      x 2  . Tubal ligation    . Appendectomy      "30 years ago"  . Cholecystectomy      "15 years ago"  . Esophagogastroduodenoscopy N/A 03/04/2014    Procedure: ESOPHAGOGASTRODUODENOSCOPY (EGD);  Surgeon: [name], MD;  Location: [facility] ENDOSCOPY;  Service: Endoscopy;  Laterality: N/A;   Family History  Problem Relation Age of Onset  . Heart attack Mother   . Heart disease Mother   . Hypertension Mother   . Heart attack Father   . Heart disease Father   . Hypertension Sister   . Heart attack Sister   . Hypertension Sister   . Heart attack Sister     Social History  Substance Use Topics  . Smoking status: Never Smoker   . Smokeless tobacco: Never Used  . Alcohol Use: No   OB History    No data available     Review of Systems  All other systems reviewed and are negative.     Allergies  Doxycycline and Percocet  Home Medications   Prior to Admission medications   Medication Sig Start Date End Date Taking? Authorizing Provider  albuterol (PROAIR HFA) 108 (90 BASE) MCG/ACT inhaler Inhale 2 puffs into the lungs every 6 (six) hours as needed for wheezing or shortness of breath.    Historical Provider, MD  amLODipine (NORVASC) 5 MG tablet Take 5 mg by mouth daily.    Historical Provider, MD  aspirin EC 325 MG EC tablet Take 1 tablet (325 mg total) by mouth daily. 04/28/15   Geradine Girt, DO  atorvastatin (LIPITOR) 80 MG tablet Take 1 tablet (80 mg total) by mouth daily at 6 PM. 04/28/15   Geradine Girt, DO  clopidogrel (PLAVIX) 75 MG tablet Take 1 tablet (75 mg total) by mouth daily. 11/21/14   Ward Givens, NP  famotidine (PEPCID) 20 MG tablet Take 1 tablet (20 mg total) by mouth daily. 04/28/15   Geradine Girt, DO  loratadine (CLARITIN)  10 MG tablet Take 10 mg by mouth daily.    Historical Provider, MD  nortriptyline (PAMELOR) 10 MG capsule Take 2 capsules (20 mg total) by mouth at bedtime. 10/21/14   Ward Givens, NP   BP 140/65 mmHg  Pulse 59  Temp(Src) 98.2 F (36.8 C) (Oral)  Resp 21  SpO2 100% Physical Exam  Constitutional: She is oriented to person, place, and time. She appears well-developed and well-nourished.  Non-toxic appearance. No distress.  HENT:  Head: Normocephalic and atraumatic.  Eyes: Conjunctivae, EOM and lids are normal. Pupils are equal, round, and reactive to light.  Neck: Normal range of motion. Neck supple. No tracheal deviation present. No thyroid mass present.  Cardiovascular: Normal rate, regular rhythm and normal heart sounds.  Exam reveals no gallop.   No murmur heard. Pulmonary/Chest:  Effort normal and breath sounds normal. No stridor. No respiratory distress. She has no decreased breath sounds. She has no wheezes. She has no rhonchi. She has no rales.  Abdominal: Soft. Normal appearance and bowel sounds are normal. She exhibits no distension. There is no tenderness. There is no rebound and no CVA tenderness.  Musculoskeletal: Normal range of motion. She exhibits no edema or tenderness.  Neurological: She is alert and oriented to person, place, and time. She has normal strength. No cranial nerve deficit or sensory deficit. GCS eye subscore is 4. GCS verbal subscore is 5. GCS motor subscore is 6.  Skin: Skin is warm and dry. No abrasion and no rash noted.  Psychiatric: She has a normal mood and affect. Her speech is normal and behavior is normal.  Nursing note and vitals reviewed.   ED Course  Procedures (including critical care time) Labs Review Labs Reviewed  CBC - Abnormal; Notable for the following:    Hemoglobin 15.3 (*)    All other components within normal limits  COMPREHENSIVE METABOLIC PANEL - Abnormal; Notable for the following:    Glucose, Bld 109 (*)    GFR calc non Af Amer 54 (*)    All other components within normal limits  I-STAT CHEM 8, ED - Abnormal; Notable for the following:    Glucose, Bld 110 (*)    Hemoglobin 16.3 (*)    HCT 48.0 (*)    All other components within normal limits  PROTIME-INR  APTT  DIFFERENTIAL  I-STAT TROPOININ, ED    Imaging Review No results found. I have personally reviewed and evaluated these images and lab results as part of my medical decision-making.   EKG Interpretation   Date/Time:  Sunday May 03 2015 15:56:36 EDT Ventricular Rate:  75 PR Interval:  154 QRS Duration: 78 QT Interval:  400 QTC Calculation: 446 R Axis:   12 Text Interpretation:  Normal sinus rhythm Septal infarct , age  undetermined Abnormal ECG No significant change since last tracing  Confirmed by Dannetta Lekas  MD, Elwyn Klosinski ([id number]) on  05/03/2015 5:48:47 PM      MDM   Final diagnoses:  None    Patient seen by neurology and they reviewed the patient's MRI and patient stable for discharge per them    Lacretia Leigh, MD 05/03/15 2044

## 2015-05-05 DIAGNOSIS — I639 Cerebral infarction, unspecified: Secondary | ICD-10-CM | POA: Diagnosis not present

## 2015-05-05 DIAGNOSIS — F439 Reaction to severe stress, unspecified: Secondary | ICD-10-CM | POA: Diagnosis not present

## 2015-05-19 ENCOUNTER — Telehealth: Payer: Self-pay | Admitting: Adult Health

## 2015-05-19 NOTE — Telephone Encounter (Signed)
Patient is calling and states that Lipitor 80 mg gives her headaches and nausea, also weakness. Please call.  Thanks!

## 2015-05-20 DIAGNOSIS — N760 Acute vaginitis: Secondary | ICD-10-CM | POA: Diagnosis not present

## 2015-05-20 DIAGNOSIS — R51 Headache: Secondary | ICD-10-CM | POA: Diagnosis not present

## 2015-05-20 NOTE — Telephone Encounter (Addendum)
Called and spoke to patient and she relayed she still has a headache . Headache is at a level 8 . Patient states she has had three death's in the last two weeks and she also saw Dr. Erlinda Hong in the hospital for stroke. Patient has follow in October with Dr.Xu. Dr. Brett Fairy what is your advise on this . Thanks [name].

## 2015-05-20 NOTE — Telephone Encounter (Signed)
Mrs. Tyndall indicated that her brother-in-law suddenly died and the next following night her nephew died. Her sister suffered a heart attack and she found her the day after. This has been a terrible stressful time for her. She describes her headache as a vice around the head- clearly a tension component ! I asked her to take Tylenol but not to exceed 2 a day. I also encouraged her to speak to her primary care physician to see if an antidepressive and or antianxiety medication is in order. She may attend grief self-help groups. She understood my instructions and thanked me for my phone call.

## 2015-06-02 ENCOUNTER — Ambulatory Visit: Payer: Medicare Other | Admitting: Neurology

## 2015-06-17 ENCOUNTER — Encounter: Payer: Self-pay | Admitting: Neurology

## 2015-06-17 ENCOUNTER — Ambulatory Visit (INDEPENDENT_AMBULATORY_CARE_PROVIDER_SITE_OTHER): Payer: Medicare Other | Admitting: Neurology

## 2015-06-17 VITALS — BP 146/74 | HR 62 | Ht 65.0 in | Wt 150.0 lb

## 2015-06-17 DIAGNOSIS — I679 Cerebrovascular disease, unspecified: Secondary | ICD-10-CM

## 2015-06-17 DIAGNOSIS — I63512 Cerebral infarction due to unspecified occlusion or stenosis of left middle cerebral artery: Secondary | ICD-10-CM

## 2015-06-17 DIAGNOSIS — I639 Cerebral infarction, unspecified: Secondary | ICD-10-CM | POA: Diagnosis not present

## 2015-06-17 DIAGNOSIS — I10 Essential (primary) hypertension: Secondary | ICD-10-CM | POA: Diagnosis not present

## 2015-06-17 DIAGNOSIS — E785 Hyperlipidemia, unspecified: Secondary | ICD-10-CM | POA: Insufficient documentation

## 2015-06-17 DIAGNOSIS — R002 Palpitations: Secondary | ICD-10-CM | POA: Diagnosis not present

## 2015-06-17 NOTE — Patient Instructions (Signed)
-   continue ASA and plavix and lipitor for stroke prevention - will do 30 day cardiac montioring to rule out irregular heart beat - Follow up with your primary care physician for stroke risk factor modification. Recommend maintain blood pressure goal <130/80, diabetes with hemoglobin A1c goal below 6.5% and lipids with LDL cholesterol goal below 70 mg/dL.  - check BP at home and BP goal 130-150. Avoid low BP - healthy diet and regular exercise - follow up in 3 months.

## 2015-06-17 NOTE — Progress Notes (Signed)
STROKE NEUROLOGY FOLLOW UP NOTE  NAME: [name] DOB: November 08, 1940  REASON FOR VISIT: stroke follow up HISTORY FROM: pt and chart  Today we had the pleasure of seeing [name] in follow-up at our Neurology Clinic. Pt was accompanied by no one.   History Summary Ms. [name] is a 74 y.o. female with history of HTN, vertebral artery stenosis vs. dissection, vertigo, migraines, breast cancer s/p right mastectomy, and asthma was admitted 04/27/15 for slurred speech vs. aphasia. MRI showed left parietal subcortical WM infarct, MRA showed left M2 occlusion, mid BA occlusion, high-grade stenosis distal R VA, and left VA nondominant but irregular question dissection vs stenosis. Her stroke etiology is likely due to large vessel atherosclerosis and intracranial atherosclerosis. CUS and 2D echo unremarkable, LDL 124 and A1c 5.9. She was continued on dual antiplatelet except increased aspirin 81 to 325. Started high-dose Lipitor 80. Blood pressure goal 120-150.   Interval History During the interval time, the patient has been doing well. Blood pressure 146/74 today, on Norvasc. He had ED visit on 05/03/2015 due to transient dysarthria, headache, and sleepiness. MRI negative for acute stroke. However, patient denied the symptoms. Instead, she stated that around that time, there is 3 family deaths in just 2 days, which made her very stressed, depressed and not herself. She stated compliance with medications.  REVIEW OF SYSTEMS: Full 14 system review of systems performed and notable only for those listed below and in HPI above, all others are negative:  Constitutional:   Cardiovascular:  Ear/Nose/Throat:   Skin:  Eyes:   Respiratory:   Gastroitestinal:   Genitourinary:  Hematology/Lymphatic:   Endocrine:  Musculoskeletal:   Allergy/Immunology:   Neurological:   Psychiatric:  Sleep:   The following represents the patient's updated allergies and side effects list: Allergies  Allergen  Reactions  . Doxycycline Nausea And Vomiting    REACTION: GI upset  . Percocet [Oxycodone-Acetaminophen] Nausea And Vomiting    The neurologically relevant items on the patient's problem list were reviewed on today's visit.  Neurologic Examination  A problem focused neurological exam (12 or more points of the single system neurologic examination, vital signs counts as 1 point, cranial nerves count for 8 points) was performed.  Blood pressure 146/74, pulse 62, height 5\' 5"  (1.651 m), weight 150 lb (68.04 kg).  General - Well nourished, well developed, in no apparent distress.  Ophthalmologic - Sharp disc margins OU.   Cardiovascular - Regular rate and rhythm with no murmur.  Mental Status -  Level of arousal and orientation to time, place, and person were intact. Language including expression, naming, repetition, comprehension was assessed and found intact. Fund of Knowledge was assessed and was intact.  Cranial Nerves II - XII - II - Visual field intact OU. III, IV, VI - Extraocular movements intact. V - Facial sensation intact bilaterally. VII - Facial movement intact bilaterally. VIII - Hearing & vestibular intact bilaterally. X - Palate elevates symmetrically. XI - Chin turning & shoulder shrug intact bilaterally. XII - Tongue protrusion intact.  Motor Strength - The patient's strength was normal in all extremities and pronator drift was absent.  Bulk was normal and fasciculations were absent.   Motor Tone - Muscle tone was assessed at the neck and appendages and was normal.  Reflexes - The patient's reflexes were 1+ in all extremities and she had no pathological reflexes.  Sensory - Light touch, temperature/pinprick were assessed and were normal.    Coordination - The  patient had normal movements in the hands and feet with no ataxia or dysmetria.  Tremor was absent.  Gait and Station - The patient's transfers, posture, gait, station, and turns were observed as  normal.  Data reviewed: I personally reviewed the images and agree with the radiology interpretations.  Dg Chest 2 View 04/27/2015 No evidence of acute cardiopulmonary disease.   [name] Contrast 04/27/2015 Sub cm acute infarction in the left parietal subcortical white matter. No hemorrhage, swelling or mass effect. Moderate chronic small vessel ischemic changes elsewhere throughout the brain.  05/03/15 -  Subacute infarct left parietal white matter is similar to the prior study and may show slight interval enlargement. No other areas of acute infarct.  [name] Head/brain Wo Cm 04/27/2015 Mid basilar occlusion is probably stable from priors. Severely diseased distal RIGHT vertebral is redemonstrated. Diseased or occluded LEFT M2 MCA trifurcation branch appears qualitatively different when compared with similar area on prior CTA. The observed pattern of infarction in the LEFT parietal subcortical white matter could represent a watershed insult.   CUS - There is 1-39% bilateral ICA stenosis. Vertebral artery flow is antegrade.   2D echo - Normal LV size with mild LV hypertrophy. EF 55-60%. Normal RV size and systolic function. Mild AI.  Component     Latest Ref Rng 04/27/2015 04/28/2015  Cholesterol     0 - 200 mg/dL  220 (H)  Triglycerides     <150 mg/dL  301 (H)  HDL Cholesterol     >40 mg/dL  36 (L)  Total CHOL/HDL Ratio       6.1  VLDL     0 - 40 mg/dL  60 (H)  LDL (calc)     0 - 99 mg/dL  124 (H)  Hemoglobin A1C     4.8 - 5.6 %  5.9 (H)  Mean Plasma Glucose       123  Sed Rate     0 - 22 mm/hr 25 (H)     Assessment: As you may recall, she is a 74 y.o. Caucasian female with PMH of HTN, vertebral artery stenosis vs. dissection, vertigo, migraines, breast cancer s/p right mastectomy, and asthma was admitted 04/27/15 for slurred speech vs. aphasia. MRI showed left parietal subcortical WM infarct, MRA showed left M2 occlusion, mid BA occlusion, high-grade  stenosis distal R VA, and left VA nondominant but irregular question dissection vs stenosis. Her stroke etiology is likely due to large vessel atherosclerosis and intracranial atherosclerosis. CUS and 2D echo unremarkable, LDL 124 and A1c 5.9. She was continued on dual antiplatelet except increased aspirin from 81 to 325. Started high-dose Lipitor 80. Blood pressure goal 120-150. During the interval time, she was doing well. Had one ED visit for AMS but MRI negative, likely due to extreme stress from death of family members. Due to new left M2 occlusion this time comparing with CTA in 11/2014, and lack of significant stroke risk factors, will do 30 day cardiac event monitoring.  Plan:  - continue ASA 325 and plavix and lipitor for stroke prevention - 30 day cardiac montioring to rule out afib. - Follow up with your primary care physician for stroke risk factor modification. Recommend maintain blood pressure goal <130/80, diabetes with hemoglobin A1c goal below 6.5% and lipids with LDL cholesterol goal below 70 mg/dL.  - check BP at home and BP goal 130-150. Avoid low BP - healthy diet and regular exercise - RTC in 3 months.  I spent more than  25 minutes of face to face time with the patient. Greater than 50% of time was spent in counseling and coordination of care.    Orders Placed This Encounter  Procedures  . Cardiac event monitor    Standing Status: Future     Number of Occurrences:      Standing Expiration Date: 06/17/2016    Scheduling Instructions:     Request cardionet setup. Thank you.    Order Specific Question:  Where should this test be performed?    Answer:  [facility]    Meds ordered this encounter  Medications  . ketoconazole (NIZORAL) 2 % cream    Sig: APPLY TO AFFECTED AREA EVERY DAY    Refill:  0    Patient Instructions  - continue ASA and plavix and lipitor for stroke prevention - will do 30 day cardiac montioring to rule out irregular heart beat - Follow up with  your primary care physician for stroke risk factor modification. Recommend maintain blood pressure goal <130/80, diabetes with hemoglobin A1c goal below 6.5% and lipids with LDL cholesterol goal below 70 mg/dL.  - check BP at home and BP goal 130-150. Avoid low BP - healthy diet and regular exercise - follow up in 3 months.    Rosalin Hawking, MD PhD [facility] [address] [place], [place] [address] [phone]

## 2015-06-22 ENCOUNTER — Other Ambulatory Visit: Payer: Self-pay | Admitting: Adult Health

## 2015-06-23 ENCOUNTER — Other Ambulatory Visit: Payer: Self-pay | Admitting: Neurology

## 2015-06-23 DIAGNOSIS — R002 Palpitations: Secondary | ICD-10-CM

## 2015-06-23 DIAGNOSIS — I4891 Unspecified atrial fibrillation: Secondary | ICD-10-CM

## 2015-06-23 DIAGNOSIS — I63512 Cerebral infarction due to unspecified occlusion or stenosis of left middle cerebral artery: Secondary | ICD-10-CM

## 2015-07-06 ENCOUNTER — Telehealth: Payer: Self-pay | Admitting: Neurology

## 2015-07-06 MED ORDER — ATORVASTATIN CALCIUM 80 MG PO TABS: 80.0000 mg | ORAL_TABLET | Freq: Every day | ORAL | Status: AC

## 2015-07-06 NOTE — Telephone Encounter (Signed)
Pt called and needs refill on atorvastatin (LIPITOR) 80 MG tablet. She is out of her medication. Did not know it needed to be authorized. May call pt at [phone]

## 2015-07-06 NOTE — Telephone Encounter (Signed)
Rx has been sent.  Receipt confirmed by pharmacy.  I called back to advise.  Patient expressed understanding.

## 2015-08-12 ENCOUNTER — Emergency Department (INDEPENDENT_AMBULATORY_CARE_PROVIDER_SITE_OTHER): Payer: Medicare Other

## 2015-08-12 ENCOUNTER — Emergency Department (INDEPENDENT_AMBULATORY_CARE_PROVIDER_SITE_OTHER)
Admission: EM | Admit: 2015-08-12 | Discharge: 2015-08-12 | Disposition: A | Discharge status: Home / Self Care | Payer: Medicare Other | Source: Home / Self Care

## 2015-08-12 ENCOUNTER — Encounter (HOSPITAL_COMMUNITY): Payer: Self-pay | Admitting: Emergency Medicine

## 2015-08-12 DIAGNOSIS — M533 Sacrococcygeal disorders, not elsewhere classified: Secondary | ICD-10-CM

## 2015-08-12 DIAGNOSIS — G8929 Other chronic pain: Secondary | ICD-10-CM

## 2015-08-12 DIAGNOSIS — M549 Dorsalgia, unspecified: Secondary | ICD-10-CM | POA: Diagnosis not present

## 2015-08-12 MED ORDER — PREDNISONE 10 MG PO TABS: ORAL_TABLET | ORAL | Status: DC

## 2015-08-12 MED ORDER — TRIAMCINOLONE ACETONIDE 40 MG/ML IJ SUSP
INTRAMUSCULAR | Status: AC
  Filled 2015-08-12: qty 1

## 2015-08-12 MED ORDER — BUPIVACAINE HCL (PF) 0.5 % IJ SOLN
INTRAMUSCULAR | Status: AC
  Filled 2015-08-12: qty 10

## 2015-08-12 NOTE — Discharge Instructions (Signed)
Sacroiliac Joint Dysfunction Sacroiliac joint dysfunction is a condition that causes inflammation on one or both sides of the sacroiliac (SI) joint. The SI joint connects the lower part of the spine (sacrum) with the two upper portions of the pelvis (ilium). This condition causes deep aching or burning pain in the low back. In some cases, the pain may also spread into one or both buttocks or hips or spread down the legs. CAUSES This condition may be caused by:  Pregnancy. During pregnancy, extra stress is put on the SI joints because the pelvis widens.  Injury, such as:  Car accidents.  Sport-related injuries.  Work-related injuries.  Having one leg that is shorter than the other.  Conditions that affect the joints, such as:  Rheumatoid arthritis.  Gout.  Psoriatic arthritis.  Joint infection (septic arthritis). Sometimes, the cause of SI joint dysfunction is not known. SYMPTOMS Symptoms of this condition include:  Aching or burning pain in the lower back. The pain may also spread to other areas, such as:  Buttocks.  Groin.  Thighs and legs.  Muscle spasms in or around the painful areas.  Increased pain when standing, walking, running, stair climbing, bending, or lifting. DIAGNOSIS Your health care provider will do a physical exam and take your medical history. During the exam, the health care provider may move one or both of your legs to different positions to check for pain. Various tests may be done to help verify the diagnosis, including:  Imaging tests to look for other causes of pain. These may include:  MRI.  CT scan.  Bone scan.  Diagnostic injection. A numbing medicine is injected into the SI joint using a needle. If the pain is temporarily improved or stopped after the injection, this can indicate that SI joint dysfunction is the problem. TREATMENT Treatment may vary depending on the cause and severity of your condition. Treatment options may  include:  Applying ice or heat to the lower back area. This can help to reduce pain and muscle spasms.  Medicines to relieve pain or inflammation or to relax the muscles.  Wearing a back brace (sacroiliac brace) to help support the joint while your back is healing.  Physical therapy to increase muscle strength around the joint and flexibility at the joint. This may also involve learning proper body positions and ways of moving to relieve stress on the joint.  Direct manipulation of the SI joint.  Injections of steroid medicine into the joint in order to reduce pain and swelling.  Radiofrequency ablation to burn away nerves that are carrying pain messages from the joint.  Use of a device that provides electrical stimulation in order to reduce pain at the joint.  Surgery to put in screws and plates that limit or prevent joint motion. This is rare. HOME CARE INSTRUCTIONS  Rest as needed. Limit your activities as directed by your health care provider.  Take medicines only as directed by your health care provider.  If directed, apply ice to the affected area:  Put ice in a plastic bag.  Place a towel between your skin and the bag.  Leave the ice on for 20 minutes, 2-3 times per day.  Use a heating pad or a moist heat pack as directed by your health care provider.  Exercise as directed by your health care provider or physical therapist.  Keep all follow-up visits as directed by your health care provider. This is important. SEEK MEDICAL CARE IF:  Your pain is not controlled   with medicine.  You have a fever.  You have increasingly severe pain. SEEK IMMEDIATE MEDICAL CARE IF:  You have weakness, numbness, or tingling in your legs or feet.  You lose control of your bladder or bowel.   This information is not intended to replace advice given to you by your health care provider. Make sure you discuss any questions you have with your health care provider.   Document Released:  11/25/2008 Document Revised: 01/13/2015 Document Reviewed: 05/06/2014 Elsevier Interactive Patient Education 2016 [employer]  

## 2015-08-12 NOTE — ED Provider Notes (Signed)
CSN: [id number]     Arrival date & time 08/12/15  1259 History   None    No chief complaint on file.  (Consider location/radiation/quality/duration/timing/severity/associated sxs/prior Treatment) HPI History obtained from patient:   LOCATION:right lower back SEVERITY: DURATION:over 6 weeks CONTEXT:sudden onset QUALITY: MODIFYING FACTORS: was taking prednisone,  ASSOCIATED SYMPTOMS: pain with sitting TIMING: constant    Past Medical History  Diagnosis Date  . Mild aortic insufficiency 03/28/2006    echo  . H/O chest pain 03/24/2006    normal nuclear  study  . Sinus bradycardia on ECG   . Cancer [facility])     right breast mastectomy  . DDD (degenerative disc disease), cervical   . Normal cardiac stress test 2005  . Asthma   . PONV (postoperative nausea and vomiting)   . Hypertension   . Headache   . Stroke [facility])    Past Surgical History  Procedure Laterality Date  . Mastectomy  1993    right breast  . Other surgical history      hysterectomy  . Rectal fissure    . Childbirth      x 2  . Tubal ligation    . Appendectomy      "30 years ago"  . Cholecystectomy      "15 years ago"  . Esophagogastroduodenoscopy N/A 03/04/2014    Procedure: ESOPHAGOGASTRODUODENOSCOPY (EGD);  Surgeon: [name], MD;  Location: [facility] ENDOSCOPY;  Service: Endoscopy;  Laterality: N/A;   Family History  Problem Relation Age of Onset  . Heart attack Mother   . Heart disease Mother   . Hypertension Mother   . Heart attack Father   . Heart disease Father   . Hypertension Sister   . Heart attack Sister   . Hypertension Sister   . Heart attack Sister    Social History  Substance Use Topics  . Smoking status: Never Smoker   . Smokeless tobacco: Never Used  . Alcohol Use: No   OB History    No data available     Review of Systems ROS +'ve back pain  Denies: HEADACHE, NAUSEA, ABDOMINAL PAIN, CHEST PAIN, CONGESTION, DYSURIA, SHORTNESS OF BREATH  Allergies  Doxycycline and  Percocet  Home Medications   Prior to Admission medications   Medication Sig Start Date End Date Taking? Authorizing Provider  amLODipine (NORVASC) 5 MG tablet Take 5 mg by mouth daily.    Historical Provider, MD  aspirin EC 325 MG EC tablet Take 1 tablet (325 mg total) by mouth daily. 04/28/15   Geradine Girt, DO  atorvastatin (LIPITOR) 80 MG tablet Take 1 tablet (80 mg total) by mouth daily at 6 PM. 07/06/15   Rosalin Hawking, MD  clopidogrel (PLAVIX) 75 MG tablet TAKE 1 TABLET (75 MG TOTAL) BY MOUTH DAILY. 06/22/15   Rosalin Hawking, MD  famotidine (PEPCID) 20 MG tablet Take 1 tablet (20 mg total) by mouth daily. 04/28/15   Geradine Girt, DO  ketoconazole (NIZORAL) 2 % cream APPLY TO AFFECTED AREA EVERY DAY 05/20/15   Historical Provider, MD  loratadine (CLARITIN) 10 MG tablet Take 10 mg by mouth daily as needed for allergies or rhinitis.     Historical Provider, MD  nortriptyline (PAMELOR) 10 MG capsule Take 2 capsules (20 mg total) by mouth at bedtime. Patient taking differently: Take 20 mg by mouth at bedtime.  10/21/14   Ward Givens, NP   Meds Ordered and Administered this Visit  Medications - No data to display  There  were no vitals taken for this visit. No data found.   Physical Exam  Constitutional: She is oriented to person, place, and time. She appears well-developed and well-nourished.  HENT:  Head: Normocephalic and atraumatic.  Pulmonary/Chest: Effort normal and breath sounds normal.  Abdominal: Soft.  Musculoskeletal: She exhibits tenderness.       Lumbar back: She exhibits tenderness and pain.       Back:  Tender over right SI joint  Neurological: She is alert and oriented to person, place, and time.  Skin: Skin is warm and dry.  Psychiatric: She has a normal mood and affect. Her behavior is normal. Judgment and thought content normal.    ED Course  Injection of joint Date/Time: 08/12/2015 2:52 PM Performed by: Konrad Felix Authorized by: Linde Gillis  C Consent: Verbal consent obtained. Consent given by: patient Patient identity confirmed: verbally with patient and arm band Time out: Immediately prior to procedure a "time out" was called to verify the correct patient, procedure, equipment, support staff and site/side marked as required. Preparation: Patient was prepped and draped in the usual sterile fashion. Local anesthesia used: yes Anesthesia: local infiltration Local anesthetic: bupivacaine 0.5% without epinephrine Anesthetic total: 4 ml Patient sedated: no Patient tolerance: Patient tolerated the procedure well with no immediate complications Comments: Pt moving around and feels much better   (including critical care time)  Labs Review Labs Reviewed - No data to display  Imaging Review Dg Lumbar Spine 2-3 Views  08/12/2015  CLINICAL DATA:  Back pain for 6 weeks, some improvement with steroids, increased pain today especially with sitting, intense pain lower RIGHT lumbar, remote history breast cancer, no acute injury EXAM: LUMBAR SPINE - 2-3 VIEW COMPARISON:  None; correlation CT abdomen and pelvis 03/03/2014 FINDINGS: Osseous demineralization. Five non-rib-bearing lumbar vertebra. Dextro convex thoracolumbar scoliosis apex L2. Vertebral body heights maintained. Scattered mild disc space narrowing. Minimal anterolisthesis L4-L5. No acute fracture, additional subluxation or bone destruction. SI joints preserved and symmetric. Surgical clips RIGHT upper quadrant question cholecystectomy. Scattered atherosclerotic calcifications. IMPRESSION: Osseous demineralization with mild degenerative disc disease changes in dextro convex thoracolumbar scoliosis. No acute abnormalities. Electronically Signed   By: Lavonia Dana M.D.   On: 08/12/2015 14:03     Visual Acuity Review  Right Eye Distance:   Left Eye Distance:   Bilateral Distance:    Right Eye Near:   Left Eye Near:    Bilateral Near:         MDM   1. Chronic right SI  joint pain          Konrad Felix, [place] 08/12/15 1453

## 2015-08-12 NOTE — ED Notes (Signed)
Lower back , right side pain.  Pain for 6 weeks.  Patient has tried prednisone.

## 2015-08-24 ENCOUNTER — Other Ambulatory Visit: Payer: Self-pay | Admitting: Adult Health

## 2015-09-09 DIAGNOSIS — B354 Tinea corporis: Secondary | ICD-10-CM | POA: Diagnosis not present

## 2015-09-09 DIAGNOSIS — J309 Allergic rhinitis, unspecified: Secondary | ICD-10-CM | POA: Diagnosis not present

## 2015-09-09 DIAGNOSIS — M109 Gout, unspecified: Secondary | ICD-10-CM | POA: Diagnosis not present

## 2015-09-09 DIAGNOSIS — M461 Sacroiliitis, not elsewhere classified: Secondary | ICD-10-CM | POA: Diagnosis not present

## 2015-09-16 ENCOUNTER — Encounter: Payer: Self-pay | Admitting: *Deleted

## 2015-09-16 NOTE — Progress Notes (Signed)
Patient ID: [name], female   DOB: May 07, 1941, 75 y.o.   MRN: [id number] Patient did not show up for 09/16/2015, 11:00 AM, appointment to have a 30 day cardiac event monitor applied.

## 2015-09-23 ENCOUNTER — Encounter: Payer: Self-pay | Admitting: Neurology

## 2015-09-23 ENCOUNTER — Ambulatory Visit (INDEPENDENT_AMBULATORY_CARE_PROVIDER_SITE_OTHER): Payer: Medicare Other | Admitting: Neurology

## 2015-09-23 VITALS — BP 148/80 | HR 61 | Ht 65.0 in | Wt 150.6 lb

## 2015-09-23 DIAGNOSIS — I6602 Occlusion and stenosis of left middle cerebral artery: Secondary | ICD-10-CM

## 2015-09-23 DIAGNOSIS — I10 Essential (primary) hypertension: Secondary | ICD-10-CM | POA: Diagnosis not present

## 2015-09-23 DIAGNOSIS — E785 Hyperlipidemia, unspecified: Secondary | ICD-10-CM | POA: Diagnosis not present

## 2015-09-23 DIAGNOSIS — I63512 Cerebral infarction due to unspecified occlusion or stenosis of left middle cerebral artery: Secondary | ICD-10-CM | POA: Diagnosis not present

## 2015-09-23 DIAGNOSIS — I679 Cerebrovascular disease, unspecified: Secondary | ICD-10-CM

## 2015-09-23 DIAGNOSIS — I7774 Dissection of vertebral artery: Secondary | ICD-10-CM

## 2015-09-23 NOTE — Patient Instructions (Addendum)
-   discontinue ASA for now - continue plavix and lipitor for stroke prevention  - 30 day cardiac montioring to rule out afib.  - Follow up with your primary care physician for stroke risk factor modification. Recommend maintain blood pressure goal 130-150, diabetes with hemoglobin A1c goal below 6.5% and lipids with LDL cholesterol goal below 70 mg/dL.  - check BP at home and BP goal 130-150. Avoid low BP  - healthy diet and regular exercise - will consider CT vessel imaging next time.  - follow up in 6 months.
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
## 2015-09-24 DIAGNOSIS — R51 Headache: Secondary | ICD-10-CM | POA: Diagnosis not present

## 2015-09-24 DIAGNOSIS — S161XXA Strain of muscle, fascia and tendon at neck level, initial encounter: Secondary | ICD-10-CM | POA: Diagnosis not present

## 2015-09-24 DIAGNOSIS — T148 Other injury of unspecified body region: Secondary | ICD-10-CM | POA: Diagnosis not present

## 2015-09-30 DIAGNOSIS — S46911A Strain of unspecified muscle, fascia and tendon at shoulder and upper arm level, right arm, initial encounter: Secondary | ICD-10-CM | POA: Diagnosis not present

## 2015-09-30 DIAGNOSIS — S139XXA Sprain of joints and ligaments of unspecified parts of neck, initial encounter: Secondary | ICD-10-CM | POA: Diagnosis not present

## 2015-10-01 DIAGNOSIS — M62838 Other muscle spasm: Secondary | ICD-10-CM | POA: Diagnosis not present

## 2015-10-01 DIAGNOSIS — M542 Cervicalgia: Secondary | ICD-10-CM | POA: Diagnosis not present

## 2015-10-07 DIAGNOSIS — M62838 Other muscle spasm: Secondary | ICD-10-CM | POA: Diagnosis not present

## 2015-10-07 DIAGNOSIS — M542 Cervicalgia: Secondary | ICD-10-CM | POA: Diagnosis not present

## 2015-10-08 DIAGNOSIS — M542 Cervicalgia: Secondary | ICD-10-CM | POA: Diagnosis not present

## 2015-10-08 DIAGNOSIS — M62838 Other muscle spasm: Secondary | ICD-10-CM | POA: Diagnosis not present

## 2015-10-14 DIAGNOSIS — M62838 Other muscle spasm: Secondary | ICD-10-CM | POA: Diagnosis not present

## 2015-10-14 DIAGNOSIS — M542 Cervicalgia: Secondary | ICD-10-CM | POA: Diagnosis not present

## 2015-10-27 ENCOUNTER — Other Ambulatory Visit: Payer: Self-pay | Admitting: Neurology

## 2015-10-28 ENCOUNTER — Ambulatory Visit: Payer: Self-pay | Admitting: Neurology

## 2015-10-28 DIAGNOSIS — M542 Cervicalgia: Secondary | ICD-10-CM | POA: Diagnosis not present

## 2015-10-28 DIAGNOSIS — M62838 Other muscle spasm: Secondary | ICD-10-CM | POA: Diagnosis not present

## 2015-10-29 DIAGNOSIS — M62838 Other muscle spasm: Secondary | ICD-10-CM | POA: Diagnosis not present

## 2015-10-29 DIAGNOSIS — M542 Cervicalgia: Secondary | ICD-10-CM | POA: Diagnosis not present

## 2015-11-04 DIAGNOSIS — M62838 Other muscle spasm: Secondary | ICD-10-CM | POA: Diagnosis not present

## 2015-11-04 DIAGNOSIS — M542 Cervicalgia: Secondary | ICD-10-CM | POA: Diagnosis not present

## 2015-11-05 DIAGNOSIS — M62838 Other muscle spasm: Secondary | ICD-10-CM | POA: Diagnosis not present

## 2015-11-05 DIAGNOSIS — M542 Cervicalgia: Secondary | ICD-10-CM | POA: Diagnosis not present

## 2015-11-11 DIAGNOSIS — M62838 Other muscle spasm: Secondary | ICD-10-CM | POA: Diagnosis not present

## 2015-11-11 DIAGNOSIS — M542 Cervicalgia: Secondary | ICD-10-CM | POA: Diagnosis not present

## 2015-11-19 DIAGNOSIS — M542 Cervicalgia: Secondary | ICD-10-CM | POA: Diagnosis not present

## 2015-11-19 DIAGNOSIS — M62838 Other muscle spasm: Secondary | ICD-10-CM | POA: Diagnosis not present

## 2015-11-25 DIAGNOSIS — M542 Cervicalgia: Secondary | ICD-10-CM | POA: Diagnosis not present

## 2015-11-25 DIAGNOSIS — M62838 Other muscle spasm: Secondary | ICD-10-CM | POA: Diagnosis not present

## 2015-11-26 DIAGNOSIS — M62838 Other muscle spasm: Secondary | ICD-10-CM | POA: Diagnosis not present

## 2015-11-26 DIAGNOSIS — M542 Cervicalgia: Secondary | ICD-10-CM | POA: Diagnosis not present

## 2015-12-03 DIAGNOSIS — M542 Cervicalgia: Secondary | ICD-10-CM | POA: Diagnosis not present

## 2015-12-03 DIAGNOSIS — M62838 Other muscle spasm: Secondary | ICD-10-CM | POA: Diagnosis not present

## 2015-12-10 DIAGNOSIS — M62838 Other muscle spasm: Secondary | ICD-10-CM | POA: Diagnosis not present

## 2015-12-10 DIAGNOSIS — M542 Cervicalgia: Secondary | ICD-10-CM | POA: Diagnosis not present

## 2015-12-17 DIAGNOSIS — L2089 Other atopic dermatitis: Secondary | ICD-10-CM | POA: Diagnosis not present

## 2016-01-13 DIAGNOSIS — L259 Unspecified contact dermatitis, unspecified cause: Secondary | ICD-10-CM | POA: Diagnosis not present

## 2016-01-13 DIAGNOSIS — K210 Gastro-esophageal reflux disease with esophagitis: Secondary | ICD-10-CM | POA: Diagnosis not present

## 2016-02-18 DIAGNOSIS — S93601A Unspecified sprain of right foot, initial encounter: Secondary | ICD-10-CM | POA: Diagnosis not present

## 2016-02-24 ENCOUNTER — Ambulatory Visit (INDEPENDENT_AMBULATORY_CARE_PROVIDER_SITE_OTHER): Payer: Medicare Other | Admitting: Podiatry

## 2016-02-24 ENCOUNTER — Encounter: Payer: Self-pay | Admitting: Podiatry

## 2016-02-24 VITALS — BP 145/76 | HR 64

## 2016-02-24 DIAGNOSIS — M722 Plantar fascial fibromatosis: Secondary | ICD-10-CM | POA: Diagnosis not present

## 2016-02-24 DIAGNOSIS — M7740 Metatarsalgia, unspecified foot: Secondary | ICD-10-CM | POA: Diagnosis not present

## 2016-02-24 NOTE — Progress Notes (Signed)
SUBJECTIVE: 75 y.o. year old female presents complaining of foot pain. She has taken a job and on feet 6 hours a day, 5 days a week. Has changed to a new pair shoes and feet are still hurting.   OBJECTIVE: DERMATOLOGIC EXAMINATION: Nails: no abnormal nails. Positive of plantar keratosis great toe.    VASCULAR EXAMINATION OF LOWER LIMBS: Pedal pulses: All pedal pulses are palpable with normal pulsation.  Temperature gradient from tibial crest to dorsum of foot is within normal bilateral.  NEUROLOGIC EXAMINATION OF THE LOWER LIMBS: All epicritic and tactile sensations grossly intact.   MUSCULOSKELETAL EXAMINATION: Positive for forefoot varus. Pain in balls and arches after prolonged standing and walking.  Elevated first ray bilateral.   ASSESSMENT: Metatarsalgia bilateral. Forefoot varus bilateral.   PLAN: Reviewed clinical findings and available treatment options. Calluses debrided.  OTC Orthotics dispensed.

## 2016-02-24 NOTE — Patient Instructions (Signed)
OTC Orthotics dispensed.

## 2016-03-23 ENCOUNTER — Ambulatory Visit (INDEPENDENT_AMBULATORY_CARE_PROVIDER_SITE_OTHER): Payer: Medicare Other | Admitting: Neurology

## 2016-03-23 ENCOUNTER — Encounter: Payer: Self-pay | Admitting: Neurology

## 2016-03-23 VITALS — BP 133/79 | HR 72 | Ht 65.0 in | Wt 148.8 lb

## 2016-03-23 DIAGNOSIS — E785 Hyperlipidemia, unspecified: Secondary | ICD-10-CM | POA: Diagnosis not present

## 2016-03-23 DIAGNOSIS — I10 Essential (primary) hypertension: Secondary | ICD-10-CM

## 2016-03-23 DIAGNOSIS — R002 Palpitations: Secondary | ICD-10-CM

## 2016-03-23 DIAGNOSIS — I679 Cerebrovascular disease, unspecified: Secondary | ICD-10-CM | POA: Diagnosis not present

## 2016-03-23 DIAGNOSIS — L300 Nummular dermatitis: Secondary | ICD-10-CM | POA: Diagnosis not present

## 2016-03-23 DIAGNOSIS — I63512 Cerebral infarction due to unspecified occlusion or stenosis of left middle cerebral artery: Secondary | ICD-10-CM

## 2016-03-23 DIAGNOSIS — N3090 Cystitis, unspecified without hematuria: Secondary | ICD-10-CM | POA: Diagnosis not present

## 2016-03-23 DIAGNOSIS — R3915 Urgency of urination: Secondary | ICD-10-CM | POA: Diagnosis not present
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
## 2016-03-23 NOTE — Patient Instructions (Addendum)
-   continue plavix and lipitor for stroke prevention  - 30 day cardiac montioring to rule out afib.  - Follow up with your primary care physician for stroke risk factor modification. Recommend maintain blood pressure goal 130-150, diabetes with hemoglobin A1c goal below 6.5% and lipids with LDL cholesterol goal below 70 mg/dL.  - check BP at home and BP goal 130-150. Avoid low BP  - healthy diet and regular exercise - will repeat CTA head and neck - follow up in 6 months.

## 2016-03-24 ENCOUNTER — Telehealth: Payer: Self-pay

## 2016-03-24 LAB — COMPREHENSIVE METABOLIC PANEL
ALBUMIN: 4.5 g/dL (ref 3.5–4.8)
ALT: 17 IU/L (ref 0–32)
AST: 16 IU/L (ref 0–40)
Albumin/Globulin Ratio: 2.4 — ABNORMAL HIGH (ref 1.2–2.2)
Alkaline Phosphatase: 97 IU/L (ref 39–117)
BILIRUBIN TOTAL: 0.6 mg/dL (ref 0.0–1.2)
BUN / CREAT RATIO: 28 (ref 12–28)
BUN: 21 mg/dL (ref 8–27)
CHLORIDE: 102 mmol/L (ref 96–106)
CO2: 23 mmol/L (ref 18–29)
Calcium: 9.5 mg/dL (ref 8.7–10.3)
Creatinine, Ser: 0.75 mg/dL (ref 0.57–1.00)
GFR, EST AFRICAN AMERICAN: 90 mL/min/{1.73_m2} (ref >59)
GFR, EST NON AFRICAN AMERICAN: 78 mL/min/{1.73_m2} (ref >59)
GLUCOSE: 107 mg/dL — AB (ref 65–99)
Globulin, Total: 1.9 g/dL (ref 1.5–4.5)
Potassium: 5.2 mmol/L (ref 3.5–5.2)
Sodium: 142 mmol/L (ref 134–144)
Total Protein: 6.4 g/dL (ref 6.0–8.5)

## 2016-03-24 LAB — LIPID PANEL
CHOL/HDL RATIO: 2.2 ratio (ref 0.0–4.4)
Cholesterol, Total: 107 mg/dL (ref 100–199)
HDL: 49 mg/dL (ref >39)
LDL CALC: 40 mg/dL (ref 0–99)
TRIGLYCERIDES: 91 mg/dL (ref 0–149)
VLDL Cholesterol Cal: 18 mg/dL (ref 5–40)

## 2016-03-24 LAB — CBC
HEMATOCRIT: 38.7 % (ref 34.0–46.6)
HEMOGLOBIN: 12.8 g/dL (ref 11.1–15.9)
MCH: 30.2 pg (ref 26.6–33.0)
MCHC: 33.1 g/dL (ref 31.5–35.7)
MCV: 91 fL (ref 79–97)
Platelets: 188 10*3/uL (ref 150–379)
RBC: 4.24 x10E6/uL (ref 3.77–5.28)
RDW: 14 % (ref 12.3–15.4)
WBC: 6.6 10*3/uL (ref 3.4–10.8)

## 2016-03-24 NOTE — Telephone Encounter (Signed)
RN call patient about her blood test. Rn stated she had normal liver function, kidney function and normal cholesterol too. Please continue current treatment. Pt verbalized understanding.

## 2016-03-24 NOTE — Telephone Encounter (Signed)
-----   Message from Rosalin Hawking, MD sent at 03/24/2016  6:52 AM EDT ----- Could you please let the patient know that the blood test done recently in our office was all good. Normal liver, kidney function and normal cholesterol too. Please continue current treatment. Thanks.  Rosalin Hawking, MD PhD Stroke Neurology 03/24/2016 6:52 AM

## 2016-03-30 ENCOUNTER — Other Ambulatory Visit: Payer: Self-pay | Admitting: Neurology

## 2016-03-30 ENCOUNTER — Ambulatory Visit
Admission: RE | Admit: 2016-03-30 | Discharge: 2016-03-30 | Disposition: A | Discharge status: Home / Self Care | Payer: Medicare Other | Source: Ambulatory Visit | Attending: Neurology | Admitting: Neurology

## 2016-03-30 DIAGNOSIS — I63233 Cerebral infarction due to unspecified occlusion or stenosis of bilateral carotid arteries: Secondary | ICD-10-CM | POA: Diagnosis not present

## 2016-03-30 DIAGNOSIS — I63512 Cerebral infarction due to unspecified occlusion or stenosis of left middle cerebral artery: Secondary | ICD-10-CM

## 2016-03-30 DIAGNOSIS — I639 Cerebral infarction, unspecified: Secondary | ICD-10-CM

## 2016-03-30 DIAGNOSIS — I4891 Unspecified atrial fibrillation: Secondary | ICD-10-CM

## 2016-03-30 DIAGNOSIS — R002 Palpitations: Secondary | ICD-10-CM

## 2016-03-30 MED ORDER — IOPAMIDOL (ISOVUE-370) INJECTION 76%
100.0000 mL | Freq: Once | INTRAVENOUS | Status: AC | PRN
  Administered 2016-03-30: 100 mL via INTRAVENOUS

## 2016-03-31 ENCOUNTER — Ambulatory Visit (INDEPENDENT_AMBULATORY_CARE_PROVIDER_SITE_OTHER): Payer: Medicare Other

## 2016-03-31 DIAGNOSIS — I639 Cerebral infarction, unspecified: Secondary | ICD-10-CM | POA: Diagnosis not present

## 2016-03-31 DIAGNOSIS — I4891 Unspecified atrial fibrillation: Secondary | ICD-10-CM | POA: Diagnosis not present

## 2016-03-31 DIAGNOSIS — R002 Palpitations: Secondary | ICD-10-CM

## 2016-04-06 ENCOUNTER — Telehealth: Payer: Self-pay | Admitting: *Deleted

## 2016-04-06 NOTE — Telephone Encounter (Signed)
Per Dr Erlinda Hong, spoke with patient and informed her that her CT angio head and neck study done recently showed essentially the same as the previous finding in the MRI, still showing multiple vessel occlusion and narrowing but no new changes or worsening. Dr Erlinda Hong advised to continue current treatment, medications and avoid low BP. Advised she call sooner than Jan 2018 follow up if needed. She verbalized understanding, appreciation.

## 2016-05-14 DIAGNOSIS — L309 Dermatitis, unspecified: Secondary | ICD-10-CM | POA: Diagnosis not present

## 2016-05-14 DIAGNOSIS — J069 Acute upper respiratory infection, unspecified: Secondary | ICD-10-CM | POA: Diagnosis not present

## 2016-06-03 ENCOUNTER — Other Ambulatory Visit: Payer: Self-pay | Admitting: Neurology

## 2016-06-08 DIAGNOSIS — L309 Dermatitis, unspecified: Secondary | ICD-10-CM | POA: Diagnosis not present

## 2016-06-15 ENCOUNTER — Other Ambulatory Visit: Payer: Self-pay | Admitting: Adult Health

## 2016-07-07 ENCOUNTER — Other Ambulatory Visit: Payer: Self-pay | Admitting: Family Medicine

## 2016-07-07 ENCOUNTER — Ambulatory Visit
Admission: RE | Admit: 2016-07-07 | Discharge: 2016-07-07 | Disposition: A | Discharge status: Home / Self Care | Payer: Medicare Other | Source: Ambulatory Visit | Attending: Family Medicine | Admitting: Family Medicine

## 2016-07-07 DIAGNOSIS — R059 Cough, unspecified: Secondary | ICD-10-CM

## 2016-07-07 DIAGNOSIS — R05 Cough: Secondary | ICD-10-CM

## 2016-07-07 DIAGNOSIS — Z23 Encounter for immunization: Secondary | ICD-10-CM | POA: Diagnosis not present

## 2016-08-07 IMAGING — CT CT ANGIO HEAD
1 of 6 series · 5 of 30 positions shown · IV contrast (APPLIED)
Comparison: MRI April 2015.  CTA 11/21/2014.

CLINICAL DATA: Followup stroke which occurred in Monday April, 2015.
Left M2 occlusion.

EXAM:
CT ANGIOGRAPHY HEAD AND NECK
TECHNIQUE: Multidetector CT imaging of the head and neck was performed using
the standard protocol during bolus administration of intravenous
contrast. Multiplanar CT image reconstructions and MIPs were
obtained to evaluate the vascular anatomy. Carotid stenosis
measurements (when applicable) are obtained utilizing NASCET
criteria, using the distal internal carotid diameter as the
denominator.
CONTRAST:  100 cc Isovue 370

[Series 7: head/neck angio · axial · 0.48mm/px · z∈[+888,+1116]mm · 5 of 114 slices shown]
[im 19/114  brain]
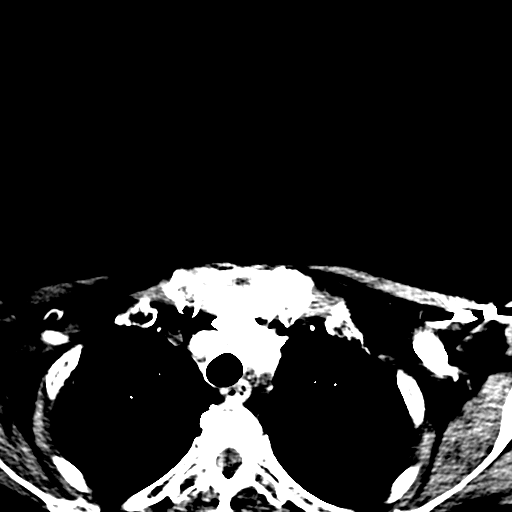
[im 38/114  bone]
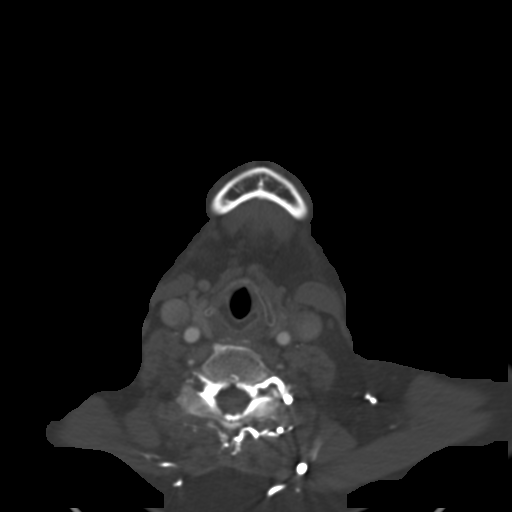
[im 57/114  brain]
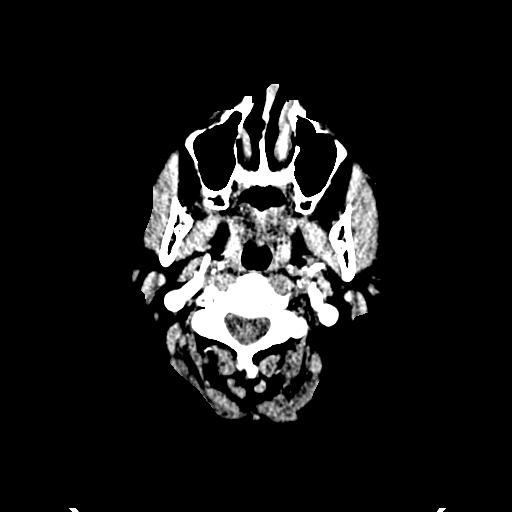
[im 76/114  bone]
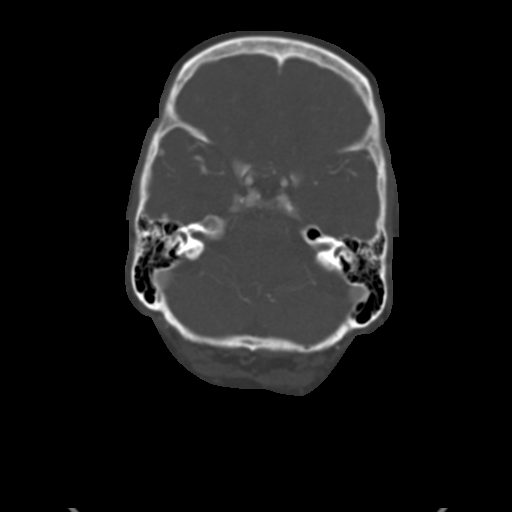
[im 95/114  brain]
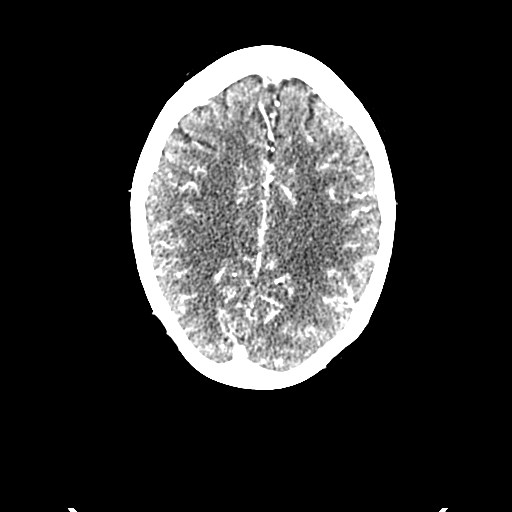

[5 of 30 positions shown; findings below may reference images not displayed]

FINDINGS: CT HEAD

No sign of acute infarction by CT. Chronic small-vessel ischemic
changes evident in the hemispheric white matter, particularly the
left frontoparietal junction region. No evidence of mass lesion,
hemorrhage, hydrocephalus or extra-axial collection. Sinuses are
clear. There is atherosclerotic calcification of the major vessels
at the base of the brain.

CTA NECK

Aortic arch: Aortic atherosclerosis. No aneurysm or dissection.
Branching pattern of the brachiocephalic vessels from the arch is
normal.

Right carotid system: Common carotid artery widely patent to the
bifurcation. Atherosclerotic disease at the carotid bifurcation.
Minimal diameter of the proximal ICA is 3.5 mm. Compared to a more
distal cervical ICA diameter of 5 mm, this indicates a 30% stenosis.

Left carotid system: Common carotid artery widely patent to the
bifurcation. Atherosclerotic disease at the carotid bifurcation.
Minimal diameter of the proximal ICA is 4 mm. Compared to a more
distal cervical ICA diameter of 5 mm, this indicates a 20% stenosis.

Vertebral arteries:50% stenosis of the right vertebral artery. The
right vertebral artery shows mild atherosclerotic irregularity
beyond that in its proximal extent. No more distal stenosis. Left
vertebral artery origin shows 50% stenosis. Beyond that, the vessel
shows atherosclerotic narrowing proximally with the more distal
extent being moderately irregular and narrow.

Skeleton: Ordinary spondylosis.

Other neck: No mass or lymphadenopathy. Pleural and parenchymal
scarring at the lung apices, right worse than left. This is similar
to the previous studies.

CTA HEAD

Anterior circulation: Both internal carotid arteries widely patent
through the skullbase. Atherosclerotic calcification in both carotid
siphon regions. No stenosis greater than 30% suspected in that
region. Supraclinoid internal carotid arteries are widely patent.
Anterior and middle cerebral vessels are patent without measurable
proximal stenosis. There is an missing M2 branch on the left
compared to the right. More distal branch vessels show
atherosclerotic irregularity diffusely.

Posterior circulation: Both vertebral arteries are patent at the
foramen magnum level. The left vertebral artery terminates in PICA
without visible contribution to the basilar. Right vertebral artery
shows focal calcified plaque with stenosis of 50% just beyond the
foramen magnum. The distal vertebral artery is a small vessel which
does supply the proximal basilar artery. The basilar artery is
occluded 1 or 2 mm beyond its origin. No flow is seen in the body of
the basilar artery. There is flow at the basilar tip and in the
superior cerebellar and posterior cerebral arteries do to patent
bilateral posterior communicating arteries. Distal branches are
atherosclerotic.

Venous sinuses: Patent and normal
IMPRESSION: Atherosclerotic disease at both carotid bifurcations. Maximal ICA
stenosis 30% on the right and 20% on the left.

50% vertebral artery stenoses bilaterally. Some atherosclerotic
irregularity in the vessels distal to that.

30% stenosis in the carotid siphon regions bilaterally.

Interval occlusion of the stenotic left M2 branch vessel seen
previously. No other critical anterior circulation stenosis
identified.

Posterior circulation appears the same. Left vertebral artery
terminates in PICA. Right vertebral artery shows 50% stenosis of the
foramen magnum level and is patent to supply the very proximal
basilar artery. Basilar artery is occluded at that point. Posterior
communicating arteries bilaterally supply the superior cerebellar
and posterior cerebral arteries, which show distal atherosclerotic
irregularity.

## 2016-08-11 DIAGNOSIS — R0600 Dyspnea, unspecified: Secondary | ICD-10-CM | POA: Diagnosis not present

## 2016-08-11 DIAGNOSIS — I7774 Dissection of vertebral artery: Secondary | ICD-10-CM | POA: Diagnosis not present

## 2016-08-11 DIAGNOSIS — K210 Gastro-esophageal reflux disease with esophagitis: Secondary | ICD-10-CM | POA: Diagnosis not present

## 2016-08-11 DIAGNOSIS — Z0000 Encounter for general adult medical examination without abnormal findings: Secondary | ICD-10-CM | POA: Diagnosis not present

## 2016-08-11 DIAGNOSIS — J309 Allergic rhinitis, unspecified: Secondary | ICD-10-CM | POA: Diagnosis not present

## 2016-08-11 DIAGNOSIS — I10 Essential (primary) hypertension: Secondary | ICD-10-CM | POA: Diagnosis not present

## 2016-08-11 DIAGNOSIS — N3000 Acute cystitis without hematuria: Secondary | ICD-10-CM | POA: Diagnosis not present

## 2016-08-11 DIAGNOSIS — R51 Headache: Secondary | ICD-10-CM | POA: Diagnosis not present

## 2016-08-11 DIAGNOSIS — R8290 Unspecified abnormal findings in urine: Secondary | ICD-10-CM | POA: Diagnosis not present

## 2016-09-01 DIAGNOSIS — R350 Frequency of micturition: Secondary | ICD-10-CM | POA: Diagnosis not present

## 2016-09-01 DIAGNOSIS — L309 Dermatitis, unspecified: Secondary | ICD-10-CM | POA: Diagnosis not present

## 2016-09-01 DIAGNOSIS — L03012 Cellulitis of left finger: Secondary | ICD-10-CM | POA: Diagnosis not present

## 2016-09-22 ENCOUNTER — Ambulatory Visit: Payer: Medicare Other | Admitting: Neurology

## 2016-11-02 ENCOUNTER — Ambulatory Visit (INDEPENDENT_AMBULATORY_CARE_PROVIDER_SITE_OTHER): Payer: Medicare Other | Admitting: Neurology

## 2016-11-02 ENCOUNTER — Other Ambulatory Visit: Payer: Self-pay | Admitting: Neurology

## 2016-11-02 ENCOUNTER — Encounter: Payer: Self-pay | Admitting: Neurology

## 2016-11-02 VITALS — BP 120/60 | HR 78 | Ht 65.0 in | Wt 156.0 lb

## 2016-11-02 DIAGNOSIS — E785 Hyperlipidemia, unspecified: Secondary | ICD-10-CM | POA: Diagnosis not present

## 2016-11-02 DIAGNOSIS — I63512 Cerebral infarction due to unspecified occlusion or stenosis of left middle cerebral artery: Secondary | ICD-10-CM | POA: Diagnosis not present

## 2016-11-02 DIAGNOSIS — I10 Essential (primary) hypertension: Secondary | ICD-10-CM | POA: Diagnosis not present

## 2016-11-02 DIAGNOSIS — M5481 Occipital neuralgia: Secondary | ICD-10-CM | POA: Diagnosis not present

## 2016-11-02 DIAGNOSIS — I679 Cerebrovascular disease, unspecified: Secondary | ICD-10-CM | POA: Diagnosis not present

## 2016-11-02 NOTE — Progress Notes (Signed)
Procedure Date:  11/02/16 Procedure: Occipital Nerve Block, right  Pre-procedure Diagnosis: Headache  Post-procedure Diagnosis: same as above  Prior to Procedure:  Informed Consent: The risks, benefits, indications, potential complications, and alternatives were explained to the patient/family and informed consent obtained.  Attending Staff:  Dr. Erlinda Hong Skin Prep: Cleansed with alcohol.  Anesthesia: 58ml Lidocaine 1% without epinephrine without added sodium bicarbonate, decadron 29ml (4mg ) Indications: 76 yo F here for right occipital neuralgia. The identity of the patient was confirmed and a bedside time out was performed.  Description of Procedure: Pt's skin was prepped with alcohol and lidocaine 1% and decadron 4mg /ml was injected over the occipital ridge in a fan-like fashion with appropriate procedure on the right. Pt had immediate relief.  Findings: immediate HA relief  Complications: The patient tolerated the procedure well with no complications.  Specimens:none  Estimated blood loss: zero    Rosalin Hawking, MD PhD Stroke Neurology 11/02/2016 3:06 PM

## 2016-11-02 NOTE — Patient Instructions (Addendum)
-   continue plavix and lipitor for stroke prevention  - Follow up with your primary care physician for stroke risk factor modification. Recommend maintain blood pressure goal 130-150, diabetes with hemoglobin A1c goal below 6.5% and lipids with LDL cholesterol goal below 70 mg/dL.  - check BP at home and BP goal 120-150. Avoid low BP  - healthy diet and regular exercise - follow up in 6 months with me.
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
## 2016-11-18 DIAGNOSIS — L290 Pruritus ani: Secondary | ICD-10-CM | POA: Diagnosis not present

## 2016-11-18 DIAGNOSIS — I10 Essential (primary) hypertension: Secondary | ICD-10-CM | POA: Diagnosis not present

## 2016-11-18 DIAGNOSIS — R51 Headache: Secondary | ICD-10-CM | POA: Diagnosis not present

## 2016-11-29 ENCOUNTER — Other Ambulatory Visit: Payer: Self-pay | Admitting: Neurology

## 2017-01-05 ENCOUNTER — Encounter: Payer: Self-pay | Admitting: Obstetrics & Gynecology

## 2017-01-05 ENCOUNTER — Ambulatory Visit (INDEPENDENT_AMBULATORY_CARE_PROVIDER_SITE_OTHER): Payer: Medicare Other | Admitting: Obstetrics & Gynecology

## 2017-01-05 VITALS — BP 130/72 | Ht 64.0 in | Wt 154.0 lb

## 2017-01-05 DIAGNOSIS — L292 Pruritus vulvae: Secondary | ICD-10-CM

## 2017-01-05 DIAGNOSIS — R8290 Unspecified abnormal findings in urine: Secondary | ICD-10-CM

## 2017-01-05 LAB — URINALYSIS W MICROSCOPIC + REFLEX CULTURE
Bilirubin Urine: NEGATIVE
Casts: NONE SEEN [LPF]
Crystals: NONE SEEN [HPF]
Glucose, UA: NEGATIVE
Hgb urine dipstick: NEGATIVE
Ketones, ur: NEGATIVE
Nitrite: NEGATIVE
Protein, ur: NEGATIVE
RBC / HPF: NONE SEEN RBC/HPF (ref <=2)
SPECIFIC GRAVITY, URINE: 1.015 (ref 1.001–1.035)
Yeast: NONE SEEN [HPF]
pH: 7 (ref 5.0–8.0)

## 2017-01-05 LAB — WET PREP FOR TRICH, YEAST, CLUE
CLUE CELLS WET PREP: NONE SEEN
Trich, Wet Prep: NONE SEEN

## 2017-01-05 MED ORDER — CLOBETASOL PROPIONATE 0.05 % EX OINT
1.0000 | TOPICAL_OINTMENT | Freq: Every day | CUTANEOUS | 3 refills | Status: DC
Start: 2017-01-05 — End: 2017-05-24

## 2017-01-05 NOTE — Addendum Note (Signed)
Addended by: Thurnell Garbe A on: 01/05/2017 12:13 PM   Modules accepted: Orders

## 2017-01-05 NOTE — Addendum Note (Signed)
Addended by: Thurnell Garbe A on: 01/05/2017 12:12 PM   Modules accepted: Orders

## 2017-01-05 NOTE — Progress Notes (Signed)
    [name] 1941-07-29 [id number]        76 y.o.  G2P2  Married.  Not sexually active.  H/O TAH/BSO.  Rt Mastectomy for Breast Ca.  H/O Strokes.  RP:  Vulvar itching x many months  Started on Triamcinolone cream 0.1% BID x 4 months without improvement.  C/O odor in urine.  No increase in vaginal d/c.  No pelvic pain.  No recent Annual/Gyn exam.  Past medical history,surgical history, problem list, medications, allergies, family history and social history were all reviewed and documented in the EPIC chart.  Directed ROS with pertinent positives and negatives documented in the history of present illness/assessment and plan.  Exam:  Vitals:   01/05/17 1106  Weight: 154 lb (69.9 kg)  Height: 5\' 4"  (1.626 m)   General appearance:  Normal  Gyn exam:  Assessment/Plan:  76 y.o.   1. Vulvar itching Severe Vulvar atrophy c/w Lichen sclerosis.  Dx/Prognosis and management discussed.    Clobetasol 0.05% ointment on affected areas of Vulva at bed time.  F/U in 3 wks for reassessment and Annual/Gyn exam.   2. Bad odor of urine U/A and Wet prep done.  Counseling >50% x 30 min on above issues  Princess Bruins MD, 11:26 AM 01/05/2017

## 2017-01-07 LAB — URINE CULTURE

## 2017-02-01 ENCOUNTER — Encounter: Payer: Medicare Other | Admitting: Obstetrics & Gynecology

## 2017-02-02 DIAGNOSIS — J069 Acute upper respiratory infection, unspecified: Secondary | ICD-10-CM | POA: Diagnosis not present

## 2017-02-03 DIAGNOSIS — J208 Acute bronchitis due to other specified organisms: Secondary | ICD-10-CM | POA: Diagnosis not present

## 2017-02-03 DIAGNOSIS — J040 Acute laryngitis: Secondary | ICD-10-CM | POA: Diagnosis not present

## 2017-02-03 DIAGNOSIS — J3489 Other specified disorders of nose and nasal sinuses: Secondary | ICD-10-CM | POA: Diagnosis not present

## 2017-02-07 DIAGNOSIS — J208 Acute bronchitis due to other specified organisms: Secondary | ICD-10-CM | POA: Diagnosis not present

## 2017-02-07 DIAGNOSIS — B349 Viral infection, unspecified: Secondary | ICD-10-CM | POA: Diagnosis not present

## 2017-02-08 DIAGNOSIS — J45909 Unspecified asthma, uncomplicated: Secondary | ICD-10-CM | POA: Diagnosis not present

## 2017-02-08 DIAGNOSIS — I10 Essential (primary) hypertension: Secondary | ICD-10-CM | POA: Diagnosis not present

## 2017-02-08 DIAGNOSIS — J209 Acute bronchitis, unspecified: Secondary | ICD-10-CM | POA: Diagnosis not present

## 2017-02-15 DIAGNOSIS — J029 Acute pharyngitis, unspecified: Secondary | ICD-10-CM | POA: Diagnosis not present

## 2017-02-15 DIAGNOSIS — R05 Cough: Secondary | ICD-10-CM | POA: Diagnosis not present

## 2017-04-26 ENCOUNTER — Other Ambulatory Visit: Payer: Self-pay | Admitting: Family Medicine

## 2017-04-26 DIAGNOSIS — R51 Headache: Secondary | ICD-10-CM | POA: Diagnosis not present

## 2017-04-26 DIAGNOSIS — R5383 Other fatigue: Secondary | ICD-10-CM | POA: Diagnosis not present

## 2017-04-26 DIAGNOSIS — L820 Inflamed seborrheic keratosis: Secondary | ICD-10-CM | POA: Diagnosis not present

## 2017-04-26 DIAGNOSIS — I10 Essential (primary) hypertension: Secondary | ICD-10-CM | POA: Diagnosis not present

## 2017-04-26 DIAGNOSIS — F411 Generalized anxiety disorder: Secondary | ICD-10-CM | POA: Diagnosis not present

## 2017-05-03 ENCOUNTER — Encounter: Payer: Self-pay | Admitting: Cardiovascular Disease

## 2017-05-03 ENCOUNTER — Ambulatory Visit: Payer: Medicare Other | Admitting: Neurology

## 2017-05-04 ENCOUNTER — Ambulatory Visit: Payer: Medicare Other | Admitting: Cardiovascular Disease

## 2017-05-11 DIAGNOSIS — R21 Rash and other nonspecific skin eruption: Secondary | ICD-10-CM | POA: Diagnosis not present

## 2017-05-24 ENCOUNTER — Ambulatory Visit (INDEPENDENT_AMBULATORY_CARE_PROVIDER_SITE_OTHER): Payer: Medicare Other | Admitting: Cardiovascular Disease

## 2017-05-24 ENCOUNTER — Encounter: Payer: Self-pay | Admitting: Cardiovascular Disease

## 2017-05-24 VITALS — BP 120/62 | HR 70 | Ht 64.0 in | Wt 157.0 lb

## 2017-05-24 DIAGNOSIS — I10 Essential (primary) hypertension: Secondary | ICD-10-CM | POA: Diagnosis not present

## 2017-05-24 DIAGNOSIS — R0609 Other forms of dyspnea: Secondary | ICD-10-CM | POA: Diagnosis not present

## 2017-05-24 DIAGNOSIS — E782 Mixed hyperlipidemia: Secondary | ICD-10-CM | POA: Diagnosis not present

## 2017-05-24 NOTE — Progress Notes (Signed)
Cardiology Office Note:    Date:  05/24/2017   ID:  [name], DOB 05/22/1941, MRN [id number]  PCP:  [name], MD  Cardiologist:  [name], MD    Referring MD: [name], MD   Chief Complaint  Patient presents with  . Shortness of Breath   Problem list 1. Shortness of breath with exertion 2. Hyperlipidemia 3. Hypertension 4. CVA - on Plavix    History of Present Illness:    [name] is a 76 y.o. female with a hx of DOE Hx of TIA years ago, was started on Plavix at that time Has no energy.   Gets short of breath walking to the mailbox and back  ( 100 yards)  No CP or tightness.    No syncope.    Has had a chronic cough / throat clearing . Had blood work at primary MD Alroy Dust)   Has significant vertebral Luevenia Maxin arterial disease. Dr. Erlinda Hong has given her a minimum  BP goal of 120-150  Dr. Mickle Plumb records from [place] reviewed   Past Medical History:  Diagnosis Date  . Asthma   . Cancer [facility])    right breast mastectomy  . DDD (degenerative disc disease), cervical   . H/O chest pain 03/24/2006   normal nuclear  study  . Headache   . High cholesterol   . Hypertension   . Mild aortic insufficiency 03/28/2006   echo  . Normal cardiac stress test 2005  . PONV (postoperative nausea and vomiting)   . Sinus bradycardia on ECG   . Stroke [facility])     Past Surgical History:  Procedure Laterality Date  . APPENDECTOMY     "30 years ago"  . childbirth     x 2  . CHOLECYSTECTOMY     "15 years ago"  . ESOPHAGOGASTRODUODENOSCOPY N/A 03/04/2014   Procedure: ESOPHAGOGASTRODUODENOSCOPY (EGD);  Surgeon: [name], MD;  Location: [facility] ENDOSCOPY;  Service: Endoscopy;  Laterality: N/A;  . MASTECTOMY  1993   right breast  . OTHER SURGICAL HISTORY     hysterectomy  . rectal fissure    . TUBAL LIGATION      Current Medications: Current Meds  Medication Sig  . ADVAIR DISKUS 250-50 MCG/DOSE AEPB INHALE 1 PUFF INTO LUNGS EVERY 12 HOURS  .  amLODipine (NORVASC) 5 MG tablet Take 5 mg by mouth daily.  Marland Kitchen atorvastatin (LIPITOR) 80 MG tablet Take 1 tablet (80 mg total) by mouth daily at 6 PM.  . clopidogrel (PLAVIX) 75 MG tablet TAKE 1 TABLET (75 MG TOTAL) BY MOUTH DAILY.  Marland Kitchen diclofenac sodium (VOLTAREN) 1 % GEL APPLY 2 TO 4 GRAMS TO AFFECTED AREA 4 TIMES A DAY AS DIRECTED  . famotidine (PEPCID) 20 MG tablet Take 1 tablet (20 mg total) by mouth daily.  Marland Kitchen loratadine (CLARITIN) 10 MG tablet Take 10 mg by mouth daily as needed for allergies or rhinitis.   Marland Kitchen montelukast (SINGULAIR) 10 MG tablet TAKE 1 TABLET BY MOUTH ONCE DAILY AS NEEDED FOR ALLERGIES OR ASTHMA  . triamcinolone cream (KENALOG) 0.1 % APPLY TOPICALLY DAILY FOR 14 DAYS. DO NOT USE ON FACE.     Allergies:   Doxycycline and Percocet [oxycodone-acetaminophen]   Social History   Social History  . Marital status: Married    Spouse name: [name]   . Number of children: 4  . Years of education: 12+   Social History Main Topics  . Smoking status: Never Smoker  . Smokeless tobacco: Never  Used  . Alcohol use No  . Drug use: No  . Sexual activity: Not Currently   Other Topics Concern  . None   Social History Narrative   Right handed, caffeine 2 cups daily, married, 4 kids, Retired.  [employer].      Family History: The patient's family history includes Cancer (age of onset: 55) in her sister; Heart attack in her father, mother, sister, and sister; Heart disease in her father and mother; Hypertension in her mother, sister, and sister. ROS:   Please see the history of present illness.     All other systems reviewed and are negative.  EKGs/Labs/Other Studies Reviewed:    The following studies were reviewed today:   EKG:  EKG is  ordered today.   Sept 12, 2018:    NSR at 70.  Normal ECG   Recent Labs: No results found for requested labs within last 8760 hours.  Recent Lipid Panel    Component Value Date/Time   CHOL 107 03/23/2016 0900   TRIG 91 03/23/2016 0900    HDL 49 03/23/2016 0900   CHOLHDL 2.2 03/23/2016 0900   CHOLHDL 6.1 04/28/2015 0645   VLDL 60 (H) 04/28/2015 0645   LDLCALC 40 03/23/2016 0900    Physical Exam:    VS:  BP 120/62   Pulse 70   Ht 5\' 4"  (1.626 m)   Wt 157 lb (71.2 kg)   BMI 26.95 kg/m     Wt Readings from Last 3 Encounters:  05/24/17 157 lb (71.2 kg)  01/05/17 154 lb (69.9 kg)  11/02/16 156 lb (70.8 kg)     GEN:  Well nourished, well developed in no acute distress HEENT: Normal NECK: No JVD; No carotid bruits LYMPHATICS: No lymphadenopathy CARDIAC: RR, no murmurs, rubs, gallops RESPIRATORY:  Clear to auscultation without rales, wheezing or rhonchi  ABDOMEN: Soft, non-tender, non-distended MUSCULOSKELETAL:  No edema; No deformity  SKIN: Warm and dry NEUROLOGIC:  Alert and oriented x 3 PSYCHIATRIC:  Normal affect   ASSESSMENT:    No diagnosis found. PLAN:    1. Shortness of breath with exertion: Tamsyn presents with several months of shortness of breath with exertion. Her blood pressure is well-controlled. She has also been having some lung issues and is going to see a pulmonologist. We will get an echocardiogram for further evaluation of her left ventricular function. She had an echo in 2016 which revealed normal left ventricle systolic function, grade 1 diastolic dysfunction, mild aortic insufficiency. Her cardiac exam is normal   She denies any angina or other symptoms that would suggest that this is coronary artery disease.  2. Hypertension: Her blood pressure is well-controlled. Continue current medications. Her neurologist has recommended that she keep her blood pressure above the 20 range.  3. Hyperlipidemia: labs from July 2017 shows  Chol = 107, HDL = 49 LDL = 40, Trigs = 91 Continue atorvastatin   In order of problems listed above: Medication Adjustments/Labs and Tests Ordered: Current medicines are reviewed at length with the patient today.  Concerns regarding medicines are outlined above.    No orders of the defined types were placed in this encounter.  No orders of the defined types were placed in this encounter.   Signed, [name], MD  05/24/2017 9:01 AM    [place]

## 2017-05-24 NOTE — Patient Instructions (Signed)
Medication Instructions:  Your physician recommends that you continue on your current medications as directed. Please refer to the Current Medication list given to you today.   Labwork: None Ordered   Testing/Procedures: Your physician has requested that you have an echocardiogram. Echocardiography is a painless test that uses sound waves to create images of your heart. It provides your doctor with information about the size and shape of your heart and how well your heart's chambers and valves are working. This procedure takes approximately one hour. There are no restrictions for this procedure.    Follow-Up: Your physician recommends that you schedule a follow-up appointment in: 3 months with Dr. [name].    If you need a refill on your cardiac medications before your next appointment, please call your pharmacy.   Thank you for choosing [facility]! Michelle Swinyer, RN [phone]    

## 2017-06-01 ENCOUNTER — Other Ambulatory Visit: Payer: Self-pay

## 2017-06-01 MED ORDER — CLOPIDOGREL BISULFATE 75 MG PO TABS: 75.0000 mg | ORAL_TABLET | Freq: Every day | ORAL | 2 refills | Status: DC

## 2017-06-13 ENCOUNTER — Ambulatory Visit (INDEPENDENT_AMBULATORY_CARE_PROVIDER_SITE_OTHER): Payer: Medicare Other | Admitting: Neurology

## 2017-06-13 ENCOUNTER — Encounter: Payer: Self-pay | Admitting: Neurology

## 2017-06-13 VITALS — BP 154/84 | HR 71 | Ht 64.0 in | Wt 155.2 lb

## 2017-06-13 DIAGNOSIS — Z8673 Personal history of transient ischemic attack (TIA), and cerebral infarction without residual deficits: Secondary | ICD-10-CM | POA: Diagnosis not present

## 2017-06-13 DIAGNOSIS — I679 Cerebrovascular disease, unspecified: Secondary | ICD-10-CM

## 2017-06-13 DIAGNOSIS — M5481 Occipital neuralgia: Secondary | ICD-10-CM | POA: Diagnosis not present

## 2017-06-13 DIAGNOSIS — I63512 Cerebral infarction due to unspecified occlusion or stenosis of left middle cerebral artery: Secondary | ICD-10-CM | POA: Diagnosis not present

## 2017-06-13 DIAGNOSIS — I10 Essential (primary) hypertension: Secondary | ICD-10-CM | POA: Diagnosis not present

## 2017-06-13 DIAGNOSIS — E785 Hyperlipidemia, unspecified: Secondary | ICD-10-CM
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
## 2017-06-13 NOTE — Patient Instructions (Addendum)
-   continue plavix and lipitor for stroke prevention  - keep hydrated and avoid dehydration. If has dizzy spells again, sit down or lie down and check BP.  - Follow up with your primary care physician for stroke risk factor modification. Recommend maintain blood pressure goal 130-150, diabetes with hemoglobin A1c goal below 6.5% and lipids with LDL cholesterol goal below 70 mg/dL.  - check BP at home and BP goal 120-150. Avoid low BP  - healthy diet and regular exercise - follow up in one year

## 2017-06-15 ENCOUNTER — Encounter (HOSPITAL_COMMUNITY): Payer: Self-pay

## 2017-06-15 ENCOUNTER — Emergency Department (HOSPITAL_COMMUNITY)
Admission: EM | Admit: 2017-06-15 | Discharge: 2017-06-15 | Disposition: A | Discharge status: Home / Self Care | Payer: Medicare Other | Attending: Emergency Medicine | Admitting: Emergency Medicine

## 2017-06-15 DIAGNOSIS — Z7902 Long term (current) use of antithrombotics/antiplatelets: Secondary | ICD-10-CM | POA: Diagnosis not present

## 2017-06-15 DIAGNOSIS — Z79899 Other long term (current) drug therapy: Secondary | ICD-10-CM | POA: Insufficient documentation

## 2017-06-15 DIAGNOSIS — Z853 Personal history of malignant neoplasm of breast: Secondary | ICD-10-CM | POA: Diagnosis not present

## 2017-06-15 DIAGNOSIS — R350 Frequency of micturition: Secondary | ICD-10-CM | POA: Diagnosis not present

## 2017-06-15 DIAGNOSIS — Z8673 Personal history of transient ischemic attack (TIA), and cerebral infarction without residual deficits: Secondary | ICD-10-CM | POA: Insufficient documentation

## 2017-06-15 DIAGNOSIS — N3090 Cystitis, unspecified without hematuria: Secondary | ICD-10-CM

## 2017-06-15 DIAGNOSIS — N3000 Acute cystitis without hematuria: Secondary | ICD-10-CM | POA: Diagnosis not present

## 2017-06-15 DIAGNOSIS — Z9049 Acquired absence of other specified parts of digestive tract: Secondary | ICD-10-CM | POA: Diagnosis not present

## 2017-06-15 DIAGNOSIS — R300 Dysuria: Secondary | ICD-10-CM | POA: Diagnosis not present

## 2017-06-15 DIAGNOSIS — I10 Essential (primary) hypertension: Secondary | ICD-10-CM | POA: Insufficient documentation

## 2017-06-15 DIAGNOSIS — R339 Retention of urine, unspecified: Secondary | ICD-10-CM | POA: Diagnosis not present

## 2017-06-15 DIAGNOSIS — J45909 Unspecified asthma, uncomplicated: Secondary | ICD-10-CM | POA: Insufficient documentation

## 2017-06-15 LAB — URINALYSIS, ROUTINE W REFLEX MICROSCOPIC
Bilirubin Urine: NEGATIVE
GLUCOSE, UA: NEGATIVE mg/dL
Ketones, ur: NEGATIVE mg/dL
Nitrite: NEGATIVE
PH: 6 (ref 5.0–8.0)
Protein, ur: 30 mg/dL — AB
SPECIFIC GRAVITY, URINE: 1.013 (ref 1.005–1.030)

## 2017-06-15 MED ORDER — CEPHALEXIN 500 MG PO CAPS: 500.0000 mg | ORAL_CAPSULE | Freq: Two times a day (BID) | ORAL | 0 refills | Status: AC

## 2017-06-15 MED ORDER — PHENAZOPYRIDINE HCL 100 MG PO TABS
200.0000 mg | ORAL_TABLET | Freq: Once | ORAL | Status: AC
  Administered 2017-06-15: 200 mg via ORAL
  Filled 2017-06-15: qty 2

## 2017-06-15 MED ORDER — PHENAZOPYRIDINE HCL 200 MG PO TABS: 200.0000 mg | ORAL_TABLET | Freq: Three times a day (TID) | ORAL | 0 refills | Status: DC

## 2017-06-15 MED ORDER — CEPHALEXIN 250 MG PO CAPS
500.0000 mg | ORAL_CAPSULE | Freq: Once | ORAL | Status: AC
  Administered 2017-06-15: 500 mg via ORAL
  Filled 2017-06-15: qty 2

## 2017-06-15 NOTE — ED Provider Notes (Signed)
[facility] DEPT Provider Note   CSN: [id number] Arrival date & time: 06/15/17  1705     History   Chief Complaint Chief Complaint  Patient presents with  . Urinary Retention    HPI [name] is a 76 y.o. female.  76 year old female history of CVA and hypertension who presents for dysuria, increased urinary frequency and decreased output sensation. Onset yesterday evening. Noticed dysuria earlier, drank less water as did not like painful sensation with urinating. Saw PCP today, was noted to have 1+ leukocytes. PCP prescibed bactrim and recommended pt present to ED for further evaluation. Pt continues to feel her bladder is full, though likely urinary frequency (BSUS showing 66cc of urine). Denies fevers. Previous urine cultures without obvious organism.  The history is provided by the patient and medical records. No language interpreter was used.    Past Medical History:  Diagnosis Date  . Asthma   . Cancer [facility])    right breast mastectomy  . DDD (degenerative disc disease), cervical   . H/O chest pain 03/24/2006   normal nuclear  study  . Headache   . High cholesterol   . Hypertension   . Mild aortic insufficiency 03/28/2006   echo  . Normal cardiac stress test 2005  . PONV (postoperative nausea and vomiting)   . Sinus bradycardia on ECG   . Stroke [facility])     Patient Active Problem List   Diagnosis Date Noted  . History of stroke 06/13/2017  . DOE (dyspnea on exertion) 05/24/2017  . Occipital neuralgia of right side 11/02/2016  . Essential hypertension 09/23/2015  . Palpitations 06/17/2015  . Cerebrovascular accident (CVA) due to occlusion of left middle cerebral artery ([place]) 06/17/2015  . Hyperlipidemia 06/17/2015  . HLD (hyperlipidemia)   . Intracranial vascular stenosis   . Cerebral infarction due to occlusion of left middle cerebral artery ([place])   . CVA (cerebral vascular accident) ([place]) 04/27/2015  . Stroke ([place]) 04/27/2015  . Cephalalgia   .  Extrinsic asthma 07/02/2014  . Migraine variant with status migrainosus 07/02/2014  . Benign paroxysmal positional vertigo 07/02/2014  . 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 Acute pancreatitis 03/03/2014  . PUD (peptic ulcer disease) 03/03/2014  . HTN (hypertension) 03/03/2014  . Abdominal pain 03/03/2014  . Nausea with vomiting 03/03/2014  . Mild aortic insufficiency   . H/O chest pain   . Sinus bradycardia on ECG   . G E R D 03/24/2008  . ADENOCARCINOMA, BREAST, RIGHT 06/30/2007  . ASTHMA 06/30/2007  . Upper airway cough syndrome 06/30/2007    Past Surgical History:  Procedure Laterality Date  . APPENDECTOMY     "30 years ago"  . childbirth     x 2  . CHOLECYSTECTOMY     "15 years ago"  . ESOPHAGOGASTRODUODENOSCOPY N/A 03/04/2014   Procedure: ESOPHAGOGASTRODUODENOSCOPY (EGD);  Surgeon: [name], MD;  Location: [facility] ENDOSCOPY;  Service: Endoscopy;  Laterality: N/A;  . MASTECTOMY  1993   right breast  . OTHER SURGICAL HISTORY     hysterectomy  . rectal fissure    . TUBAL LIGATION      OB History    Gravida Para Term Preterm AB Living   2 2       2    SAB TAB Ectopic Multiple Live Births                   Home Medications    Prior to Admission medications   Medication Sig Start Date End  Date Taking? Authorizing Provider  ADVAIR DISKUS 250-50 MCG/DOSE AEPB INHALE 1 PUFF INTO LUNGS EVERY 12 HOURS 08/11/16  Yes [provider]  amLODipine (NORVASC) 5 MG tablet Take 5 mg by mouth daily.   Yes [provider]  atorvastatin (LIPITOR) 80 MG tablet Take 1 tablet (80 mg total) by mouth daily at 6 PM. 07/06/15  Yes Rosalin Hawking, MD  busPIRone (BUSPAR) 10 MG tablet Take 5 mg by mouth 2 (two) times daily. 06/01/17  Yes [provider]  clopidogrel (PLAVIX) 75 MG tablet Take 1 tablet (75 mg total) by mouth daily. 06/01/17  Yes Rosalin Hawking, MD  diclofenac sodium (VOLTAREN) 1 % GEL APPLY 2 TO 4 GRAMS TO AFFECTED AREA 4 TIMES A DAY AS DIRECTED  10/09/16  Yes [provider]  loratadine (CLARITIN) 10 MG tablet Take 10 mg by mouth daily as needed for allergies or rhinitis.    Yes [provider]  nortriptyline (PAMELOR) 10 MG capsule TAKE 2 CAPSULES (20 MG TOTAL) BY MOUTH AT BEDTIME. 06/01/17  Yes [provider]  pantoprazole (PROTONIX) 40 MG tablet Take 40 mg by mouth daily as needed. 06/05/17  Yes [provider]  cephALEXin (KEFLEX) 500 MG capsule Take 1 capsule (500 mg total) by mouth 2 (two) times daily. 06/15/17 06/29/17  Payton Emerald, MD  phenazopyridine (PYRIDIUM) 200 MG tablet Take 1 tablet (200 mg total) by mouth 3 (three) times daily. 06/15/17   Payton Emerald, MD    Family History Family History  Problem Relation Age of Onset  . Heart attack Mother   . Heart disease Mother   . Hypertension Mother   . Heart attack Father   . Heart disease Father   . Hypertension Sister   . Heart attack Sister   . Cancer Sister 27       colon  . Hypertension Sister   . Heart attack Sister     Social History Social History  Substance Use Topics  . Smoking status: Never Smoker  . Smokeless tobacco: Never Used  . Alcohol use No     Allergies   Doxycycline and Percocet [oxycodone-acetaminophen]   Review of Systems Review of Systems  Constitutional: Negative for chills and fever.  HENT: Negative for ear pain and sore throat.   Eyes: Negative for pain and visual disturbance.  Respiratory: Negative for cough and shortness of breath.   Cardiovascular: Negative for chest pain and palpitations.  Gastrointestinal: Negative for abdominal pain and vomiting.  Genitourinary: Positive for difficulty urinating, dysuria and frequency. Negative for hematuria, vaginal bleeding and vaginal discharge.  Musculoskeletal: Negative for arthralgias and back pain.  Skin: Negative for color change and rash.  Neurological: Negative for seizures and syncope.  All other systems reviewed and are negative.    Physical  Exam Updated Vital Signs BP (!) 142/67   Pulse 67   Temp 98.1 F (36.7 C) (Oral)   Resp 19   Ht 5\' 4"  (1.626 m)   Wt 70.3 kg (155 lb)   SpO2 95%   BMI 26.61 kg/m   Physical Exam  Constitutional: She appears well-developed. No distress.  HENT:  Head: Normocephalic and atraumatic.  Eyes: Conjunctivae are normal.  Neck: Neck supple.  Cardiovascular: Normal rate and regular rhythm.   No murmur heard. Pulmonary/Chest: Effort normal and breath sounds normal. No respiratory distress.  Abdominal: Soft. There is no tenderness.  Genitourinary:  Genitourinary Comments: BSUS showing 66 cc of urine  Musculoskeletal: She exhibits no edema.  Neurological: She  is alert. No cranial nerve deficit. Coordination normal.  5/5 motor strength and intact sensation in all extremities. Finger-to-nose intact bilaterally  Skin: Skin is warm and dry.  Nursing note and vitals reviewed.    ED Treatments / Results  Labs (all labs ordered are listed, but only abnormal results are displayed) Labs Reviewed  URINALYSIS, ROUTINE W REFLEX MICROSCOPIC - Abnormal; Notable for the following:       Result Value   APPearance HAZY (*)    Hgb urine dipstick MODERATE (*)    Protein, ur 30 (*)    Leukocytes, UA MODERATE (*)    Bacteria, UA RARE (*)    Squamous Epithelial / LPF 0-5 (*)    All other components within normal limits  URINE CULTURE    EKG  EKG Interpretation None       Radiology No results found.  Procedures Procedures (including critical care time)  Medications Ordered in ED Medications  phenazopyridine (PYRIDIUM) tablet 200 mg (200 mg Oral Given 06/15/17 2128)  cephALEXin (KEFLEX) capsule 500 mg (500 mg Oral Given 06/15/17 2128)     Initial Impression / Assessment and Plan / ED Course  I have reviewed the triage vital signs and the nursing notes.  Pertinent labs & imaging results that were available during my care of the patient were reviewed by me and considered in my medical  decision making (see chart for details).     59 yoF who p/w dysuria and sensation of incomplete emptying. Likely urinary frequency and bladder spasms as only 66 cc of urine on BSUS. AF, VSS. Lungs CTAB.  UA showing evidence of UTI with moderate leukocytes and TNTC Urine WBCs and rare bacteria. Oral Keflex given. Keflex rx provided. Pt tolerating PO and ambulating well, stable for close PCP f/u.  Return precautions provided for worsening symptoms. Pt will f/u with PCP at first availability. Pt verbalized agreement with plan.  Pt care d/w Dr. Rex Kras  Final Clinical Impressions(s) / ED Diagnoses   Final diagnoses:  Cystitis  Urinary frequency    New Prescriptions Discharge Medication List as of 06/15/2017 11:04 PM    START taking these medications   Details  cephALEXin (KEFLEX) 500 MG capsule Take 1 capsule (500 mg total) by mouth 2 (two) times daily., Starting Thu 06/15/2017, Until Thu 06/29/2017, Print         Payton Emerald, MD 06/16/17 [place], Wenda Overland, MD 06/21/17 1359

## 2017-06-15 NOTE — ED Notes (Signed)
Bladder scan 209

## 2017-06-15 NOTE — Discharge Instructions (Signed)
Please take Keflex as directed. Maintain hydration. Please return for worsening fevers or pain. Please followup with Dr. Alroy Dust within 1 week.

## 2017-06-15 NOTE — ED Notes (Signed)
Patient ambulating to the restroom

## 2017-06-15 NOTE — ED Notes (Signed)
Patient Alert and oriented X4. Stable and ambulatory. Patient verbalized understanding of the discharge instructions.  Patient belongings were taken by the patient.  

## 2017-06-15 NOTE — ED Triage Notes (Signed)
Per Pt, Pt is coming from home with complaints of urinary retention that started yesterday with burning and pressure. Pt was told by PCP to be sent over for potential catheter. NO fever or vagina discharge.

## 2017-06-18 LAB — URINE CULTURE

## 2017-06-19 ENCOUNTER — Telehealth: Payer: Self-pay | Admitting: Emergency Medicine

## 2017-06-19 NOTE — Telephone Encounter (Signed)
Post ED Visit - Positive Culture Follow-up  Culture report reviewed by antimicrobial stewardship pharmacist:  []  Elenor Quinones, Pharm.D. [x]  Heide Guile, Pharm.D., BCPS AQ-ID []  Parks Ne[place]une, Pharm.D., BCPS []  Alycia Rossetti, Pharm.D., BCPS []  [place], Pharm.D., BCPS, AAHIVP []  Legrand Como, Pharm.D., BCPS, AAHIVP []  Salome Arnt, PharmD, BCPS []  Dimitri Ped, PharmD, BCPS []  Vincenza Hews, PharmD, BCPS  Positive urine culture Treated with cephalexin, organism sensitive to the same and no further patient follow-up is required at this time.  Hazle Nordmann 06/19/2017, 11:48 AM

## 2017-06-20 ENCOUNTER — Ambulatory Visit (HOSPITAL_COMMUNITY): Payer: Medicare Other | Attending: Internal Medicine

## 2017-06-20 ENCOUNTER — Other Ambulatory Visit: Payer: Self-pay

## 2017-06-20 DIAGNOSIS — R0609 Other forms of dyspnea: Secondary | ICD-10-CM | POA: Diagnosis not present

## 2017-06-20 DIAGNOSIS — I119 Hypertensive heart disease without heart failure: Secondary | ICD-10-CM | POA: Insufficient documentation

## 2017-06-20 DIAGNOSIS — I071 Rheumatic tricuspid insufficiency: Secondary | ICD-10-CM | POA: Diagnosis not present

## 2017-06-22 DIAGNOSIS — I10 Essential (primary) hypertension: Secondary | ICD-10-CM | POA: Diagnosis not present

## 2017-06-22 DIAGNOSIS — Z79899 Other long term (current) drug therapy: Secondary | ICD-10-CM | POA: Diagnosis not present

## 2017-06-22 DIAGNOSIS — Z853 Personal history of malignant neoplasm of breast: Secondary | ICD-10-CM | POA: Diagnosis not present

## 2017-06-22 DIAGNOSIS — J4530 Mild persistent asthma, uncomplicated: Secondary | ICD-10-CM | POA: Diagnosis not present

## 2017-06-22 DIAGNOSIS — R5383 Other fatigue: Secondary | ICD-10-CM | POA: Diagnosis not present

## 2017-06-22 DIAGNOSIS — Z9011 Acquired absence of right breast and nipple: Secondary | ICD-10-CM | POA: Diagnosis not present

## 2017-06-22 DIAGNOSIS — Z8673 Personal history of transient ischemic attack (TIA), and cerebral infarction without residual deficits: Secondary | ICD-10-CM | POA: Diagnosis not present

## 2017-06-22 DIAGNOSIS — R05 Cough: Secondary | ICD-10-CM | POA: Diagnosis not present

## 2017-06-22 DIAGNOSIS — R0600 Dyspnea, unspecified: Secondary | ICD-10-CM | POA: Diagnosis not present

## 2017-06-22 DIAGNOSIS — K219 Gastro-esophageal reflux disease without esophagitis: Secondary | ICD-10-CM | POA: Diagnosis not present

## 2017-06-27 ENCOUNTER — Other Ambulatory Visit: Payer: Self-pay | Admitting: *Deleted

## 2017-06-27 MED ORDER — NORTRIPTYLINE HCL 10 MG PO CAPS: 20.0000 mg | ORAL_CAPSULE | Freq: Every day | ORAL | 1 refills | Status: DC

## 2017-07-03 DIAGNOSIS — S40011A Contusion of right shoulder, initial encounter: Secondary | ICD-10-CM | POA: Diagnosis not present

## 2017-07-13 DIAGNOSIS — M189 Osteoarthritis of first carpometacarpal joint, unspecified: Secondary | ICD-10-CM | POA: Diagnosis not present

## 2017-07-27 DIAGNOSIS — M79645 Pain in left finger(s): Secondary | ICD-10-CM | POA: Diagnosis not present

## 2017-07-27 DIAGNOSIS — M654 Radial styloid tenosynovitis [de Quervain]: Secondary | ICD-10-CM | POA: Diagnosis not present

## 2017-08-23 ENCOUNTER — Ambulatory Visit: Payer: Medicare Other | Admitting: Cardiovascular Disease

## 2017-08-24 DIAGNOSIS — M654 Radial styloid tenosynovitis [de Quervain]: Secondary | ICD-10-CM | POA: Diagnosis not present

## 2017-08-25 ENCOUNTER — Encounter: Payer: Self-pay | Admitting: Cardiovascular Disease

## 2017-09-04 DIAGNOSIS — M654 Radial styloid tenosynovitis [de Quervain]: Secondary | ICD-10-CM | POA: Diagnosis not present

## 2017-09-04 DIAGNOSIS — M1812 Unilateral primary osteoarthritis of first carpometacarpal joint, left hand: Secondary | ICD-10-CM | POA: Diagnosis not present

## 2017-09-27 DIAGNOSIS — L308 Other specified dermatitis: Secondary | ICD-10-CM | POA: Diagnosis not present

## 2017-10-03 DIAGNOSIS — M1812 Unilateral primary osteoarthritis of first carpometacarpal joint, left hand: Secondary | ICD-10-CM | POA: Diagnosis not present

## 2017-10-03 DIAGNOSIS — M654 Radial styloid tenosynovitis [de Quervain]: Secondary | ICD-10-CM | POA: Insufficient documentation

## 2017-10-05 DIAGNOSIS — R0981 Nasal congestion: Secondary | ICD-10-CM | POA: Diagnosis not present

## 2017-10-05 DIAGNOSIS — Z79899 Other long term (current) drug therapy: Secondary | ICD-10-CM | POA: Diagnosis not present

## 2017-10-05 DIAGNOSIS — R42 Dizziness and giddiness: Secondary | ICD-10-CM | POA: Diagnosis not present

## 2017-10-05 DIAGNOSIS — G43909 Migraine, unspecified, not intractable, without status migrainosus: Secondary | ICD-10-CM | POA: Diagnosis not present

## 2017-10-05 DIAGNOSIS — Z9011 Acquired absence of right breast and nipple: Secondary | ICD-10-CM | POA: Diagnosis not present

## 2017-10-05 DIAGNOSIS — J4530 Mild persistent asthma, uncomplicated: Secondary | ICD-10-CM | POA: Diagnosis not present

## 2017-10-05 DIAGNOSIS — R0982 Postnasal drip: Secondary | ICD-10-CM | POA: Diagnosis not present

## 2017-10-05 DIAGNOSIS — Z23 Encounter for immunization: Secondary | ICD-10-CM | POA: Diagnosis not present

## 2017-10-05 DIAGNOSIS — Z7902 Long term (current) use of antithrombotics/antiplatelets: Secondary | ICD-10-CM | POA: Diagnosis not present

## 2017-10-05 DIAGNOSIS — Z885 Allergy status to narcotic agent status: Secondary | ICD-10-CM | POA: Diagnosis not present

## 2017-10-05 DIAGNOSIS — R05 Cough: Secondary | ICD-10-CM | POA: Diagnosis not present

## 2017-10-05 DIAGNOSIS — Z853 Personal history of malignant neoplasm of breast: Secondary | ICD-10-CM | POA: Diagnosis not present

## 2017-10-05 DIAGNOSIS — I10 Essential (primary) hypertension: Secondary | ICD-10-CM | POA: Diagnosis not present

## 2017-10-05 DIAGNOSIS — Z923 Personal history of irradiation: Secondary | ICD-10-CM | POA: Diagnosis not present

## 2017-10-05 DIAGNOSIS — K219 Gastro-esophageal reflux disease without esophagitis: Secondary | ICD-10-CM | POA: Diagnosis not present

## 2017-10-05 DIAGNOSIS — Z9221 Personal history of antineoplastic chemotherapy: Secondary | ICD-10-CM | POA: Diagnosis not present

## 2017-10-05 DIAGNOSIS — Z8673 Personal history of transient ischemic attack (TIA), and cerebral infarction without residual deficits: Secondary | ICD-10-CM | POA: Diagnosis not present

## 2017-10-05 DIAGNOSIS — I6509 Occlusion and stenosis of unspecified vertebral artery: Secondary | ICD-10-CM | POA: Diagnosis not present

## 2017-10-05 DIAGNOSIS — Z7951 Long term (current) use of inhaled steroids: Secondary | ICD-10-CM | POA: Diagnosis not present

## 2017-10-05 DIAGNOSIS — R0609 Other forms of dyspnea: Secondary | ICD-10-CM | POA: Diagnosis not present

## 2017-10-20 DIAGNOSIS — R300 Dysuria: Secondary | ICD-10-CM | POA: Diagnosis not present

## 2017-10-20 DIAGNOSIS — R5383 Other fatigue: Secondary | ICD-10-CM | POA: Diagnosis not present

## 2017-10-23 ENCOUNTER — Other Ambulatory Visit: Payer: Self-pay

## 2017-10-23 MED ORDER — NORTRIPTYLINE HCL 10 MG PO CAPS: 20.0000 mg | ORAL_CAPSULE | Freq: Every day | ORAL | 1 refills | Status: DC

## 2017-11-08 DIAGNOSIS — B349 Viral infection, unspecified: Secondary | ICD-10-CM | POA: Diagnosis not present

## 2017-11-27 ENCOUNTER — Ambulatory Visit: Payer: Medicare Other | Admitting: Neurology

## 2017-11-29 ENCOUNTER — Encounter (INDEPENDENT_AMBULATORY_CARE_PROVIDER_SITE_OTHER): Payer: Self-pay

## 2017-11-29 ENCOUNTER — Encounter: Payer: Self-pay | Admitting: Neurology

## 2017-11-29 ENCOUNTER — Ambulatory Visit (INDEPENDENT_AMBULATORY_CARE_PROVIDER_SITE_OTHER): Payer: Medicare Other | Admitting: Neurology

## 2017-11-29 VITALS — BP 151/79 | HR 67 | Wt 161.0 lb

## 2017-11-29 DIAGNOSIS — M5481 Occipital neuralgia: Secondary | ICD-10-CM | POA: Diagnosis not present

## 2017-11-29 DIAGNOSIS — I679 Cerebrovascular disease, unspecified: Secondary | ICD-10-CM

## 2017-11-29 DIAGNOSIS — I10 Essential (primary) hypertension: Secondary | ICD-10-CM | POA: Diagnosis not present

## 2017-11-29 DIAGNOSIS — E785 Hyperlipidemia, unspecified: Secondary | ICD-10-CM

## 2017-11-29 DIAGNOSIS — Z8673 Personal history of transient ischemic attack (TIA), and cerebral infarction without residual deficits: Secondary | ICD-10-CM | POA: Diagnosis not present

## 2017-11-29 MED ORDER — NORTRIPTYLINE HCL 10 MG PO CAPS: 20.0000 mg | ORAL_CAPSULE | Freq: Every day | ORAL | 3 refills | Status: DC
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
## 2017-11-29 NOTE — Patient Instructions (Addendum)
-   continue plavix and lipitor for stroke prevention  - keep hydrated and avoid dehydration. If has dizzy spells again, sit down or lie down and check BP.  - Follow up with your primary care physician for stroke risk factor modification. Recommend maintain blood pressure goal 130-150, diabetes with hemoglobin A1c goal below 6.5% and lipids with LDL cholesterol goal below 70 mg/dL.  - refill nortriptyline for you and take at night for HA and nerve pain.  - check BP at home and BP goal 120-150. Avoid low BP  - healthy diet and regular exercise - follow up in 6 months with [name].

## 2017-12-11 DIAGNOSIS — M545 Low back pain: Secondary | ICD-10-CM | POA: Diagnosis not present

## 2017-12-11 DIAGNOSIS — J309 Allergic rhinitis, unspecified: Secondary | ICD-10-CM | POA: Diagnosis not present

## 2017-12-11 DIAGNOSIS — L309 Dermatitis, unspecified: Secondary | ICD-10-CM | POA: Diagnosis not present

## 2018-03-07 DIAGNOSIS — M79642 Pain in left hand: Secondary | ICD-10-CM | POA: Diagnosis not present

## 2018-03-28 ENCOUNTER — Telehealth: Payer: Self-pay | Admitting: Neurology

## 2018-03-28 NOTE — Telephone Encounter (Signed)
Dr. Leonie Man stated pt can see NP but if she insist he can see her in November. RN will call pt tomorrow.

## 2018-03-28 NOTE — Telephone Encounter (Signed)
Rn call patient about refusal to see a NP or PA at [facility]. Rn explain that Dr. Erlinda Hong saw her on 11/29/2017 and recommend she NP ongoing for her neurological issues. PT stated she saw a NP and felt they could not treat her headaches ,and neurological issues.She states when she was here the NP stated well let me consult with the MD. Pt stated she just does not feel comfortable with a NP or PA. Pt states they are not a MD, they cant treat symptoms or issues. RN tried to explain that NP and PA work with the MD. They can read test, order scans,  Order medications do etc. PT was insisted on not wanting to see NP. Pt stated well if I cant see a NP, I will go to another neurology office. But before I do that I want the MD to tell me they will not see me. Rn explain that the MD recommend she see a NP for her next visit. Rn also stated the MD is here for support if needed. RN stated a message will be sent to Dr .Leonie Man. RN also stated Dr. Leonie Man is only here every two weeks and his schedule gets full a lot because he works at the hospital. Rn stated if she wants to see him and insist on it, it will probably be NOvember before she is seen . PT was fine with that.

## 2018-03-28 NOTE — Telephone Encounter (Signed)
Pt called stating she did not want to see an NP, stating "if my insurance is paying for a doctor I'm seeing a doctor". Pt requesting a call to discuss being scheduled with an MD

## 2018-03-29 NOTE — Telephone Encounter (Addendum)
Dr. Leonie Man had appts in October. Rn stated Dr.SEthi will see her because of he refusal of not wanting to see NP. Pt gave new appt and check in time.Appt with Hoyle Sauer NP cancel.

## 2018-04-19 ENCOUNTER — Other Ambulatory Visit: Payer: Self-pay

## 2018-04-24 ENCOUNTER — Other Ambulatory Visit: Payer: Self-pay | Admitting: Family Medicine

## 2018-04-24 DIAGNOSIS — Z1231 Encounter for screening mammogram for malignant neoplasm of breast: Secondary | ICD-10-CM

## 2018-04-25 DIAGNOSIS — J309 Allergic rhinitis, unspecified: Secondary | ICD-10-CM | POA: Diagnosis not present

## 2018-04-25 DIAGNOSIS — I10 Essential (primary) hypertension: Secondary | ICD-10-CM | POA: Diagnosis not present

## 2018-04-25 DIAGNOSIS — I7774 Dissection of vertebral artery: Secondary | ICD-10-CM | POA: Diagnosis not present

## 2018-04-25 DIAGNOSIS — R8290 Unspecified abnormal findings in urine: Secondary | ICD-10-CM | POA: Diagnosis not present

## 2018-04-25 DIAGNOSIS — R5383 Other fatigue: Secondary | ICD-10-CM | POA: Diagnosis not present

## 2018-05-17 ENCOUNTER — Ambulatory Visit (INDEPENDENT_AMBULATORY_CARE_PROVIDER_SITE_OTHER): Payer: Medicare Other | Admitting: Cardiovascular Disease

## 2018-05-17 ENCOUNTER — Encounter: Payer: Self-pay | Admitting: Cardiovascular Disease

## 2018-05-17 ENCOUNTER — Encounter: Payer: Self-pay | Admitting: Nurse Practitioner

## 2018-05-17 VITALS — BP 128/62 | HR 70 | Ht 64.0 in | Wt 158.0 lb

## 2018-05-17 DIAGNOSIS — I208 Other forms of angina pectoris: Secondary | ICD-10-CM | POA: Diagnosis not present

## 2018-05-17 DIAGNOSIS — R0609 Other forms of dyspnea: Secondary | ICD-10-CM | POA: Diagnosis not present

## 2018-05-17 NOTE — Progress Notes (Signed)
Cardiology Office Note:    Date:  05/17/2018   ID:  [name], DOB Dec 23, 1940, MRN [id number]  PCP:  [name], MD  Cardiologist:  [name], MD    Referring MD: [name], MD   Chief Complaint  Patient presents with  . Shortness of Breath   Problem list 1. Shortness of breath with exertion - Asthma  2. Hyperlipidemia 3. Hypertension 4. CVA - on Plavix       [name] is a 77 y.o. female with a hx of DOE Hx of TIA years ago, was started on Plavix at that time Has no energy.   Gets short of breath walking to the mailbox and back  ( 100 yards)  No CP or tightness.    No syncope.    Has had a chronic cough / throat clearing . Had blood work at primary MD Alroy Dust)   Has significant vertebral [place] Maxin arterial disease. Dr. Erlinda Hong has given her a minimum  BP goal of 120-150  Dr. Mickle Plumb records from [place] reviewed  Sept. 5, 2019:  Seen for DOE and generalized fatigue Went to [facility] pulmonary and was told she had asthma Takes her inhalers.  Went to her primary care for her fatigue - was told to decrease her carbs   Denies any chest pain  Has headaches and has some dizziness ( history of a TIA in the past )  Does not get any regular exercise   Past Medical History:  Diagnosis Date  . Asthma   . Cancer [facility])    right breast mastectomy  . DDD (degenerative disc disease), cervical   . H/O chest pain 03/24/2006   normal nuclear  study  . Headache   . High cholesterol   . Hypertension   . Mild aortic insufficiency 03/28/2006   echo  . Normal cardiac stress test 2005  . PONV (postoperative nausea and vomiting)   . Sinus bradycardia on ECG   . Stroke [facility])     Past Surgical History:  Procedure Laterality Date  . APPENDECTOMY     "30 years ago"  . childbirth     x 2  . CHOLECYSTECTOMY     "15 years ago"  . ESOPHAGOGASTRODUODENOSCOPY N/A 03/04/2014   Procedure: ESOPHAGOGASTRODUODENOSCOPY (EGD);  Surgeon: [name], MD;   Location[facility] ENDOSCOPY;  Service: Endoscopy;  Laterality: N/A;  . MASTECTOMY  1993   right breast  . OTHER SURGICAL HISTORY     hysterectomy  . rectal fissure    . TUBAL LIGATION      Current Medications: Current Meds  Medication Sig  . albuterol (PROVENTIL HFA;VENTOLIN HFA) 108 (90 Base) MCG/ACT inhaler Inhale 2 puffs into the lungs every 4 (four) hours as needed for shortness of breath.   Marland Kitchen amLODipine (NORVASC) 5 MG tablet Take 5 mg by mouth daily.  Marland Kitchen atorvastatin (LIPITOR) 80 MG tablet Take 1 tablet (80 mg total) by mouth daily at 6 PM.  . clopidogrel (PLAVIX) 75 MG tablet Take 1 tablet (75 mg total) by mouth daily.  . diclofenac sodium (VOLTAREN) 1 % GEL APPLY 2 TO 4 GRAMS TO AFFECTED AREA 4 TIMES A DAY AS DIRECTED  . famotidine (PEPCID) 20 MG tablet Take 20 mg by mouth daily.   Marland Kitchen loratadine (CLARITIN) 10 MG tablet Take 10 mg by mouth daily as needed for allergies or rhinitis.   Marland Kitchen nortriptyline (PAMELOR) 10 MG capsule Take 2 capsules (20 mg total) by mouth at bedtime.  Marland Kitchen  pantoprazole (PROTONIX) 40 MG tablet Take 40 mg by mouth daily as needed.  . phenazopyridine (PYRIDIUM) 200 MG tablet Take 1 tablet (200 mg total) by mouth 3 (three) times daily.     Allergies:   Doxycycline and Percocet [oxycodone-acetaminophen]   Social History   Socioeconomic History  . Marital status: Married    Spouse name: [name]   . Number of children: 4  . Years of education: 12+  . Highest education level: Not on file  Occupational History  . Not on file  Social Needs  . Financial resource strain: Not on file  . Food insecurity:    Worry: Not on file    Inability: Not on file  . Transportation needs:    Medical: Not on file    Non-medical: Not on file  Tobacco Use  . Smoking status: Never Smoker  . Smokeless tobacco: Never Used  Substance and Sexual Activity  . Alcohol use: No    Alcohol/week: 0.0 standard drinks  . Drug use: No  . Sexual activity: Not Currently  Lifestyle  . Physical  activity:    Days per week: Not on file    Minutes per session: Not on file  . Stress: Not on file  Relationships  . Social connections:    Talks on phone: Not on file    Gets together: Not on file    Attends religious service: Not on file    Active member of club or organization: Not on file    Attends meetings of clubs or organizations: Not on file    Relationship status: Not on file  Other Topics Concern  . Not on file  Social History Narrative   Right handed, caffeine 2 cups daily, married, 4 kids, Retired.  [employer].      Family History: The patient's family history includes Cancer (age of onset: 72) in her sister; Heart attack in her father, mother, sister, and sister; Heart disease in her father and mother; Hypertension in her mother, sister, and sister. ROS:   Please see the history of present illness.     All other systems reviewed and are negative.  EKGs/Labs/Other Studies Reviewed:    The following studies were reviewed today:   EKG: May 17, 2018: Normal sinus rhythm at 70 beats a minute.  Minimal voltage criteria for left ventricular hypertrophy.  Poor R wave progression. Recent Labs: No results found for requested labs within last 8760 hours.  Recent Lipid Panel    Component Value Date/Time   CHOL 107 03/23/2016 0900   TRIG 91 03/23/2016 0900   HDL 49 03/23/2016 0900   CHOLHDL 2.2 03/23/2016 0900   CHOLHDL 6.1 04/28/2015 0645   VLDL 60 (H) 04/28/2015 0645   LDLCALC 40 03/23/2016 0900    Physical Exam: Blood pressure 128/62, pulse 70, height 5\' 4"  (1.626 m), weight 158 lb (71.7 kg), SpO2 99 %.  GEN:  Well nourished, well developed in no acute distress HEENT: Normal NECK: No JVD; No carotid bruits LYMPHATICS: No lymphadenopathy CARDIAC: RR, no murmurs, rubs, gallops RESPIRATORY:  Clear to auscultation without rales, wheezing or rhonchi  ABDOMEN: Soft, non-tender, non-distended MUSCULOSKELETAL:  No edema; No deformity  SKIN: Warm and  dry NEUROLOGIC:  Alert and oriented x 3   ASSESSMENT:    1. DOE (dyspnea on exertion)   2. Anginal equivalent ([place])    PLAN:    1. Shortness of breath with exertion:  Larkin Ina continues to have lots of shortness of breath with exertion.  She was diagnosed with asthma but she has no wheezing today on exam.  She becomes short of breath walking from the parking lot into the office.  I am worried that this might be an angina equivalent.  We we will do a [place] study to evaluate her for the possibility of ischemia.  She does have a history of hyperlipidemia as well.  See her back in 3 months for follow-up visit.     2. Hypertension:    Pressure is well controlled.  3. Hyperlipidemia: Primary medical doctor.  In order of problems listed above: Medication Adjustments/Labs and Tests Ordered: Current medicines are reviewed at length with the patient today.  Concerns regarding medicines are outlined above.  Orders Placed This Encounter  Procedures  . Myocardial Perfusion Imaging  . EKG 12-Lead   No orders of the defined types were placed in this encounter.    Signed, [name], MD  05/17/2018 1:18 PM    [facility]

## 2018-05-17 NOTE — Patient Instructions (Signed)
Medication Instructions:  Your physician recommends that you continue on your current medications as directed. Please refer to the Current Medication list given to you today.   Labwork: None Ordered   Testing/Procedures: Your physician has requested that you have a lexiscan myoview. For further information please visit [email or web address]. Please follow instruction sheet, as given.    Follow-Up: Your physician recommends that you schedule a follow-up appointment in: 3 months with Dr. [name]   If you need a refill on your cardiac medications before your next appointment, please call your pharmacy.   Thank you for choosing [facility]! Michelle Swinyer, RN [phone]    

## 2018-05-21 ENCOUNTER — Encounter: Payer: Self-pay | Admitting: Neurology

## 2018-05-21 ENCOUNTER — Ambulatory Visit (INDEPENDENT_AMBULATORY_CARE_PROVIDER_SITE_OTHER): Payer: Medicare Other | Admitting: Neurology

## 2018-05-21 ENCOUNTER — Telehealth: Payer: Self-pay | Admitting: Neurology

## 2018-05-21 VITALS — BP 148/84 | HR 78 | Wt 158.0 lb

## 2018-05-21 DIAGNOSIS — G441 Vascular headache, not elsewhere classified: Secondary | ICD-10-CM

## 2018-05-21 DIAGNOSIS — I6529 Occlusion and stenosis of unspecified carotid artery: Secondary | ICD-10-CM

## 2018-05-21 DIAGNOSIS — G4452 New daily persistent headache (NDPH): Secondary | ICD-10-CM

## 2018-05-21 MED ORDER — TOPIRAMATE 25 MG PO TABS
25.0000 mg | ORAL_TABLET | Freq: Two times a day (BID) | ORAL | 3 refills | Status: DC
Start: 2018-05-21 — End: 2018-06-21

## 2018-05-21 NOTE — Patient Instructions (Signed)
I had a long discussion with the patient regarding her knee oh persistent daily headache for the last 1 week. I encouraged her to discontinue Tylenol as she may have a component of an allergic analgesic rebound headache. Trial of Topamax 25 mg twice daily with meals for headaches. Check MRI scan of the brain and also check MRA of the brain and neck to follow her intracranial stenosis. I also encouraged her to do regular neck stretching exercises as well as participate in activities for stress laxation like regular exercise, meditation and yoga. Continue Plavix for stroke prevention with strict control of hypertension with blood pressure goal below 130/90, lipids with LDL cholesterol goal below 70 mg percent and diabetes with inflammation A1c goal below 6.5%. She will return for follow-up in 2 months with Janett Billow my nurse practitioner Janett Billow or call earlier if necessary.  Analgesic Rebound Headache An analgesic rebound headache, sometimes called a medication overuse headache, is a headache that comes after pain medicine (analgesic) taken to treat the original (primary) headache has worn off. Any type of primary headache can return as a rebound headache if a person regularly takes analgesics more than three times a week to treat it. The types of primary headaches that are commonly associated with rebound headaches include:  Migraines.  Headaches that arise from tense muscles in the head and neck area (tension headaches).  Headaches that develop and happen again (recur) on one side of the head and around the eye (cluster headaches).  If rebound headaches continue, they become chronic daily headaches. What are the causes? This condition may be caused by frequent use of:  Over-the-counter medicines such as aspirin, ibuprofen, and acetaminophen.  Sinus relief medicines and other medicines that contain caffeine.  Narcotic pain medicines such as codeine and oxycodone.  What are the signs or  symptoms? The symptoms of a rebound headache are the same as the symptoms of the original headache. Some of the symptoms of specific types of headaches include: Migraine headache  Pulsing or throbbing pain on one or both sides of the head.  Severe pain that interferes with daily activities.  Pain that is worsened by physical activity.  Nausea, vomiting, or both.  Pain with exposure to bright light, loud noises, or strong smells.  General sensitivity to bright light, loud noises, or strong smells.  Visual changes.  Numbness of one or both arms. Tension headache  Pressure around the head.  Dull, aching head pain.  Pain felt over the front and sides of the head.  Tenderness in the muscles of the head, neck, and shoulders. Cluster headache  Severe pain that begins in or around one eye or temple.  Redness and tearing in the eye on the same side as the pain.  Droopy or swollen eyelid.  One-sided head pain.  Nausea.  Runny nose.  Sweaty, pale facial skin.  Restlessness. How is this diagnosed? This condition is diagnosed by:  Reviewing your medical history. This includes the nature of your primary headaches.  Reviewing the types of pain medicines that you have been using to treat your headaches and how often you take them.  How is this treated? This condition may be treated or managed by:  Discontinuing frequent use of the analgesic medicine. Doing this may worsen your headaches at first, but the pain should eventually become more manageable, less frequent, and less severe.  Seeing a headache specialist. He or she may be able to help you manage your headaches and help make sure there  is not another cause of the headaches.  Using methods of stress relief, such as acupuncture, counseling, biofeedback, and massage. Talk with your health care provider about which methods might be good for you.  Follow these instructions at home:  Take over-the-counter and  prescription medicines only as told by your health care provider.  Stop the repeated use of pain medicine as told by your health care provider. Stopping can be difficult. Carefully follow instructions from your health care provider.  Avoid triggers that are known to cause your primary headaches.  Keep all follow-up visits as told by your health care provider. This is important. Contact a health care provider if:  You continue to experience headaches after following treatments that your health care provider recommended. Get help right away if:  You develop new headache pain.  You develop headache pain that is different than what you have experienced in the past.  You develop numbness or tingling in your arms or legs.  You develop changes in your speech or vision. This information is not intended to replace advice given to you by your health care provider. Make sure you discuss any questions you have with your health care provider. Document Released: 11/19/2003 Document Revised: 03/18/2016 Document Reviewed: 02/01/2016 Elsevier Interactive Patient Education  [employer].

## 2018-05-21 NOTE — Telephone Encounter (Signed)
Medicare order sent to [facility]. No auth they will reach out to the pt to schedule.  °
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
## 2018-05-22 ENCOUNTER — Ambulatory Visit
Admission: RE | Admit: 2018-05-22 | Discharge: 2018-05-22 | Disposition: A | Discharge status: Home / Self Care | Payer: Medicare Other | Source: Ambulatory Visit | Attending: Family Medicine | Admitting: Family Medicine

## 2018-05-22 ENCOUNTER — Ambulatory Visit: Payer: Medicare Other | Admitting: Podiatry

## 2018-05-22 DIAGNOSIS — Z1231 Encounter for screening mammogram for malignant neoplasm of breast: Secondary | ICD-10-CM | POA: Diagnosis not present

## 2018-05-22 HISTORY — DX: Personal history of antineoplastic chemotherapy: Z92.21

## 2018-05-22 HISTORY — DX: Personal history of irradiation: Z92.3

## 2018-05-23 ENCOUNTER — Encounter (HOSPITAL_COMMUNITY): Payer: Medicare Other

## 2018-05-24 DIAGNOSIS — J069 Acute upper respiratory infection, unspecified: Secondary | ICD-10-CM | POA: Diagnosis not present

## 2018-05-31 ENCOUNTER — Encounter (HOSPITAL_COMMUNITY): Payer: Medicare Other

## 2018-06-05 ENCOUNTER — Ambulatory Visit: Payer: Medicare Other | Admitting: Podiatry

## 2018-06-11 ENCOUNTER — Telehealth (HOSPITAL_COMMUNITY): Payer: Self-pay

## 2018-06-11 NOTE — Telephone Encounter (Signed)
Pt contacted with her instructions for Nuclear Stress Test. Pt stated that she would be here. S.Eilan Mcinerny EMTP

## 2018-06-12 ENCOUNTER — Ambulatory Visit
Admission: RE | Admit: 2018-06-12 | Discharge: 2018-06-12 | Disposition: A | Discharge status: Home / Self Care | Payer: Medicare Other | Source: Ambulatory Visit | Attending: Neurology | Admitting: Neurology

## 2018-06-12 ENCOUNTER — Encounter: Payer: Self-pay | Admitting: Radiology

## 2018-06-12 DIAGNOSIS — G441 Vascular headache, not elsewhere classified: Secondary | ICD-10-CM

## 2018-06-12 DIAGNOSIS — I6529 Occlusion and stenosis of unspecified carotid artery: Secondary | ICD-10-CM

## 2018-06-12 MED ORDER — GADOBENATE DIMEGLUMINE 529 MG/ML IV SOLN
15.0000 mL | Freq: Once | INTRAVENOUS | Status: AC | PRN
  Administered 2018-06-12: 15 mL via INTRAVENOUS

## 2018-06-13 ENCOUNTER — Ambulatory Visit: Payer: Medicare Other | Admitting: Nurse Practitioner

## 2018-06-14 ENCOUNTER — Ambulatory Visit (HOSPITAL_COMMUNITY): Payer: Medicare Other | Attending: Cardiovascular Disease

## 2018-06-14 DIAGNOSIS — I208 Other forms of angina pectoris: Secondary | ICD-10-CM | POA: Diagnosis not present

## 2018-06-14 DIAGNOSIS — R0609 Other forms of dyspnea: Secondary | ICD-10-CM | POA: Diagnosis not present

## 2018-06-14 LAB — MYOCARDIAL PERFUSION IMAGING
LV dias vol: 47 mL (ref 46–106)
LV sys vol: 16 mL
Peak HR: 85 {beats}/min
Rest HR: 68 {beats}/min
SDS: 0
SRS: 0
SSS: 0
TID: 0.82

## 2018-06-14 MED ORDER — REGADENOSON 0.4 MG/5ML IV SOLN
0.4000 mg | Freq: Once | INTRAVENOUS | Status: AC
  Administered 2018-06-14: 0.4 mg via INTRAVENOUS

## 2018-06-14 MED ORDER — TECHNETIUM TC 99M TETROFOSMIN IV KIT
10.1000 | PACK | Freq: Once | INTRAVENOUS | Status: AC | PRN
  Administered 2018-06-14: 10.1 via INTRAVENOUS
  Filled 2018-06-14: qty 11

## 2018-06-14 MED ORDER — TECHNETIUM TC 99M TETROFOSMIN IV KIT
30.6000 | PACK | Freq: Once | INTRAVENOUS | Status: AC | PRN
  Administered 2018-06-14: 30.6 via INTRAVENOUS
  Filled 2018-06-14: qty 31

## 2018-06-19 ENCOUNTER — Ambulatory Visit: Payer: Self-pay | Admitting: Neurology

## 2018-06-21 ENCOUNTER — Other Ambulatory Visit: Payer: Self-pay

## 2018-06-21 ENCOUNTER — Emergency Department (HOSPITAL_BASED_OUTPATIENT_CLINIC_OR_DEPARTMENT_OTHER): Payer: Medicare Other

## 2018-06-21 ENCOUNTER — Encounter (HOSPITAL_BASED_OUTPATIENT_CLINIC_OR_DEPARTMENT_OTHER): Payer: Self-pay | Admitting: *Deleted

## 2018-06-21 ENCOUNTER — Emergency Department (HOSPITAL_BASED_OUTPATIENT_CLINIC_OR_DEPARTMENT_OTHER)
Admission: EM | Admit: 2018-06-21 | Discharge: 2018-06-21 | Disposition: A | Discharge status: Home / Self Care | Payer: Medicare Other | Attending: Emergency Medicine | Admitting: Emergency Medicine

## 2018-06-21 DIAGNOSIS — J45909 Unspecified asthma, uncomplicated: Secondary | ICD-10-CM | POA: Insufficient documentation

## 2018-06-21 DIAGNOSIS — Z8673 Personal history of transient ischemic attack (TIA), and cerebral infarction without residual deficits: Secondary | ICD-10-CM | POA: Insufficient documentation

## 2018-06-21 DIAGNOSIS — S2232XA Fracture of one rib, left side, initial encounter for closed fracture: Secondary | ICD-10-CM | POA: Diagnosis not present

## 2018-06-21 DIAGNOSIS — Z9011 Acquired absence of right breast and nipple: Secondary | ICD-10-CM | POA: Diagnosis not present

## 2018-06-21 DIAGNOSIS — Y999 Unspecified external cause status: Secondary | ICD-10-CM | POA: Diagnosis not present

## 2018-06-21 DIAGNOSIS — W1789XA Other fall from one level to another, initial encounter: Secondary | ICD-10-CM | POA: Insufficient documentation

## 2018-06-21 DIAGNOSIS — Y9389 Activity, other specified: Secondary | ICD-10-CM | POA: Diagnosis not present

## 2018-06-21 DIAGNOSIS — S299XXA Unspecified injury of thorax, initial encounter: Secondary | ICD-10-CM | POA: Diagnosis not present

## 2018-06-21 DIAGNOSIS — Z853 Personal history of malignant neoplasm of breast: Secondary | ICD-10-CM | POA: Insufficient documentation

## 2018-06-21 DIAGNOSIS — I10 Essential (primary) hypertension: Secondary | ICD-10-CM | POA: Diagnosis not present

## 2018-06-21 DIAGNOSIS — Z7901 Long term (current) use of anticoagulants: Secondary | ICD-10-CM | POA: Diagnosis not present

## 2018-06-21 DIAGNOSIS — R0781 Pleurodynia: Secondary | ICD-10-CM | POA: Diagnosis not present

## 2018-06-21 DIAGNOSIS — Z79899 Other long term (current) drug therapy: Secondary | ICD-10-CM | POA: Insufficient documentation

## 2018-06-21 DIAGNOSIS — Y92812 Truck as the place of occurrence of the external cause: Secondary | ICD-10-CM | POA: Insufficient documentation

## 2018-06-21 DIAGNOSIS — W19XXXA Unspecified fall, initial encounter: Secondary | ICD-10-CM

## 2018-06-21 HISTORY — DX: Malignant neoplasm of unspecified site of unspecified female breast: C50.919

## 2018-06-21 MED ORDER — OXYCODONE HCL 5 MG PO TABS
5.0000 mg | ORAL_TABLET | Freq: Once | ORAL | Status: DC
  Filled 2018-06-21: qty 1

## 2018-06-21 MED ORDER — ACETAMINOPHEN 500 MG PO TABS
1000.0000 mg | ORAL_TABLET | Freq: Once | ORAL | Status: AC
  Administered 2018-06-21: 1000 mg via ORAL
  Filled 2018-06-21: qty 2

## 2018-06-21 NOTE — Discharge Instructions (Signed)
Take Tylenol 1000 mg 4 times a day for 1 week. This is the maximum dose of Tylenol you can take from all sources.

## 2018-06-21 NOTE — ED Notes (Signed)
Patient transported to X-ray 

## 2018-06-21 NOTE — ED Provider Notes (Signed)
[facility] EMERGENCY DEPARTMENT Provider Note   CSN: [id number] Arrival date & time: 06/21/18  1605     History   Chief Complaint Chief Complaint  Patient presents with  . Fall    HPI COLISHA REDLER is a 77 y.o. female.  Patient with history of breast cancer, stroke, on Plavix --presents to the emergency department today with complaint of left chest and rib pain after a fall this morning.  Patient was on the side of a truck and fell approximately 2 feet onto her left side.  She denies hitting her head or losing consciousness.  She has had no confusion or vomiting since that time.  She reports pain along the left lateral chest and into her left anterior chest, worse with breathing.  Pain is worse with movement.  She denies any hip pain and is ambulatory.  She does not have any pain in her neck or her lower back.  She took Tylenol approximately an hour ago without much improvement. The onset of this condition was acute. The course is constant. Aggravating factors: movement, deep breathing. Alleviating factors: none.       Past Medical History:  Diagnosis Date  . Asthma   . Breast CA ([place])   . Cancer [facility])    right breast mastectomy  . DDD (degenerative disc disease), cervical   . H/O chest pain 03/24/2006   normal nuclear  study  . Headache   . High cholesterol   . Hypertension   . Mild aortic insufficiency 03/28/2006   echo  . Normal cardiac stress test 2005  . Personal history of chemotherapy   . Personal history of radiation therapy   . PONV (postoperative nausea and vomiting)   . Sinus bradycardia on ECG   . Stroke [facility])     Patient Active Problem List   Diagnosis Date Noted  . History of stroke 06/13/2017  . DOE (dyspnea on exertion) 05/24/2017  . Occipital neuralgia of right side 11/02/2016  . Essential hypertension 09/23/2015  . Palpitations 06/17/2015  . Cerebrovascular accident (CVA) due to occlusion of left middle cerebral artery ([place]) 06/17/2015   . Hyperlipidemia 06/17/2015  . HLD (hyperlipidemia)   . Intracranial vascular stenosis   . Cerebral infarction due to occlusion of left middle cerebral artery ([place])   . CVA (cerebral vascular accident) ([place]) 04/27/2015  . Stroke ([place]) 04/27/2015  . Cephalalgia   . Extrinsic asthma 07/02/2014  . Migraine variant with status migrainosus 07/02/2014  . Benign paroxysmal positional vertigo 07/02/2014  . 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 Acute pancreatitis 03/03/2014  . PUD (peptic ulcer disease) 03/03/2014  . HTN (hypertension) 03/03/2014  . Abdominal pain 03/03/2014  . Nausea with vomiting 03/03/2014  . Mild aortic insufficiency   . H/O chest pain   . Sinus bradycardia on ECG   . G E R D 03/24/2008  . ADENOCARCINOMA, BREAST, RIGHT 06/30/2007  . ASTHMA 06/30/2007  . Upper airway cough syndrome 06/30/2007    Past Surgical History:  Procedure Laterality Date  . APPENDECTOMY     "30 years ago"  . BREAST SURGERY    . childbirth     x 2  . CHOLECYSTECTOMY     "15 years ago"  . ESOPHAGOGASTRODUODENOSCOPY N/A 03/04/2014   Procedure: ESOPHAGOGASTRODUODENOSCOPY (EGD);  Surgeon: [name], MD;  Location[facility] ENDOSCOPY;  Service: Endoscopy;  Laterality: N/A;  . MASTECTOMY  1993   right breast  . OTHER SURGICAL HISTORY     hysterectomy  .  rectal fissure    . TUBAL LIGATION       OB History    Gravida  2   Para  2   Term      Preterm      AB      Living  2     SAB      TAB      Ectopic      Multiple      Live Births               Home Medications    Prior to Admission medications   Medication Sig Start Date End Date Taking? Authorizing Provider  albuterol (PROVENTIL HFA;VENTOLIN HFA) 108 (90 Base) MCG/ACT inhaler Inhale 2 puffs into the lungs every 4 (four) hours as needed for shortness of breath.  10/10/17 10/10/18  [provider]  amLODipine (NORVASC) 5 MG tablet Take 5 mg by mouth daily.    [provider]    atorvastatin (LIPITOR) 80 MG tablet Take 1 tablet (80 mg total) by mouth daily at 6 PM. 07/06/15   Rosalin Hawking, MD  clopidogrel (PLAVIX) 75 MG tablet Take 1 tablet (75 mg total) by mouth daily. 06/01/17   Rosalin Hawking, MD  famotidine (PEPCID) 20 MG tablet Take 20 mg by mouth daily.  11/24/15   [provider]  Fluticasone-Salmeterol (ADVAIR) 250-50 MCG/DOSE AEPB Inhale 1 puff into the lungs 2 (two) times daily.    [provider]  loratadine (CLARITIN) 10 MG tablet Take 10 mg by mouth daily as needed for allergies or rhinitis.     [provider]  nortriptyline (PAMELOR) 10 MG capsule Take 2 capsules (20 mg total) by mouth at bedtime. 11/29/17   Rosalin Hawking, MD  pantoprazole (PROTONIX) 40 MG tablet Take 40 mg by mouth daily as needed. 06/05/17   [provider]    Family History Family History  Problem Relation Age of Onset  . Heart attack Mother   . Heart disease Mother   . Hypertension Mother   . Heart attack Father   . Heart disease Father   . Hypertension Sister   . Heart attack Sister   . Cancer Sister 23       colon  . Hypertension Sister   . Heart attack Sister     Social History Social History   Tobacco Use  . Smoking status: Never Smoker  . Smokeless tobacco: Never Used  Substance Use Topics  . Alcohol use: No    Alcohol/week: 0.0 standard drinks  . Drug use: No     Allergies   Doxycycline and Percocet [oxycodone-acetaminophen]   Review of Systems Review of Systems  Constitutional: Negative for fever.  HENT: Negative for rhinorrhea and sore throat.   Eyes: Negative for redness.  Respiratory: Negative for cough and shortness of breath.   Cardiovascular: Positive for chest pain.  Gastrointestinal: Negative for abdominal pain, diarrhea, nausea and vomiting.  Genitourinary: Negative for dysuria.  Musculoskeletal: Positive for back pain. Negative for gait problem, myalgias, neck pain and neck stiffness.  Skin: Negative for rash.   Neurological: Negative for headaches.     Physical Exam Updated Vital Signs BP 140/79   Pulse 84   Temp 97.9 F (36.6 C) (Oral)   Resp 18   Ht 5\' 5"  (1.651 m)   Wt 72.6 kg   SpO2 100%   BMI 26.63 kg/m   Physical Exam  Constitutional: She appears well-developed and well-nourished.  HENT:  Head: Normocephalic  and atraumatic.  Mouth/Throat: Oropharynx is clear and moist.  Eyes: Conjunctivae are normal. Right eye exhibits no discharge. Left eye exhibits no discharge.  Neck: Normal range of motion. Neck supple.  Cardiovascular: Normal rate, regular rhythm and normal heart sounds.  Pulmonary/Chest: Effort normal and breath sounds normal. No respiratory distress. She has no wheezes. She has no rales. She exhibits tenderness (Patient is diffusely tender from her left lateral back across her left lateral ribs onto her anterior chest wall.).  Abdominal: Soft. There is no tenderness.  No abdominal pain exhibited on exam including in the left upper quadrant.  She does not react with light or deep palpation in these areas.  Neurological: She is alert.  Skin: Skin is warm and dry.  Psychiatric: She has a normal mood and affect.  Nursing note and vitals reviewed.    ED Treatments / Results  Labs (all labs ordered are listed, but only abnormal results are displayed) Labs Reviewed - No data to display  EKG None  Radiology Dg Ribs Unilateral W/chest Left  Result Date: 06/21/2018 CLINICAL DATA:  Left rib pain status post fall off a truck EXAM: LEFT RIBS AND CHEST - 3+ VIEW COMPARISON:  Chest x-ray 07/07/2016 FINDINGS: No fracture or other bone lesions are seen involving the ribs. There is no evidence of pneumothorax or pleural effusion. Bilateral diffuse mild interstitial thickening likely chronic. No focal consolidation, pleural effusion or pneumothorax. Heart size and mediastinal contours are within normal limits. IMPRESSION: No acute osseous injury of the left ribs. Electronically  Signed   By: Kathreen Devoid   On: 06/21/2018 17:24   Ct Chest Wo Contrast  Result Date: 06/21/2018 CLINICAL DATA:  Status post fall.  Left flank pain. EXAM: CT CHEST WITHOUT CONTRAST TECHNIQUE: Multidetector CT imaging of the chest was performed following the standard protocol without IV contrast. COMPARISON:  Chest x-ray 07/07/2016 FINDINGS: Cardiovascular: Normal heart size. No pericardial effusion. Normal caliber thoracic aorta with atherosclerosis. Coronary artery atherosclerosis in the LAD, circumflex and RCA. Mediastinum/Nodes: No enlarged mediastinal or axillary lymph nodes. Thyroid gland, trachea, and esophagus demonstrate no significant findings. Lungs/Pleura: No focal consolidation, pleural effusion or pneumothorax. Mild bilateral interstitial thickening peripherally in the upper and lower lobes as can be seen with mild chronic interstitial lung disease. Upper Abdomen: No acute upper abdominal abnormality. Musculoskeletal: Subtle irregularity of left anterior fifth rib which may reflect a subtle nondisplaced fracture (image 85/series 3). No aggressive osseous lesion. IMPRESSION: 1. Subtle irregularity of left anterior fifth rib which may reflect a subtle nondisplaced fracture (image 85/series 3). 2.  Aortic Atherosclerosis (ICD10-I70.0). Electronically Signed   By: Kathreen Devoid   On: 06/21/2018 20:01    Procedures Procedures (including critical care time)  Medications Ordered in ED Medications - No data to display   Initial Impression / Assessment and Plan / ED Course  I have reviewed the triage vital signs and the nursing notes.  Pertinent labs & imaging results that were available during my care of the patient were reviewed by me and considered in my medical decision making (see chart for details).     Patient seen and examined. Work-up initiated. No abd pain, back or midline pain, head or neck injury.   Vital signs reviewed and are as follows: BP 140/79   Pulse 84   Temp 97.9 F  (36.6 C) (Oral)   Resp 18   Ht 5\' 5"  (1.651 m)   Wt 72.6 kg   SpO2 100%   BMI 26.63 kg/m  8:42 PM Patient discussed with Dr. Billy Fischer -- ordered CT imaging.   CT shows ? subtle rib fracture.   Patient discharged by Dr. Billy Fischer.   Final Clinical Impressions(s) / ED Diagnoses   Final diagnoses:  Fall, initial encounter  Closed fracture of one rib of left side, initial encounter   Patient with rib fracture/contusion after a fall.  No other concerning injury.  Conservative measures indicated at this point.  Home with incentive spirometer.  ED Discharge Orders    None       Carlisle Cater, Hershal Coria 06/21/18 2044    Gareth Morgan, MD 06/22/18 1243

## 2018-06-21 NOTE — ED Triage Notes (Addendum)
Pt c/o fall from getting out of a truck , c/o left flank/rib  and lower back pain

## 2018-06-21 NOTE — ED Notes (Addendum)
Pt states that she took 2 "extra strength tylenol"  Approx. 1 hour ago

## 2018-07-03 DIAGNOSIS — B354 Tinea corporis: Secondary | ICD-10-CM | POA: Diagnosis not present

## 2018-07-03 DIAGNOSIS — S2232XD Fracture of one rib, left side, subsequent encounter for fracture with routine healing: Secondary | ICD-10-CM | POA: Diagnosis not present

## 2018-07-03 DIAGNOSIS — Z23 Encounter for immunization: Secondary | ICD-10-CM | POA: Diagnosis not present

## 2018-07-03 DIAGNOSIS — J309 Allergic rhinitis, unspecified: Secondary | ICD-10-CM | POA: Diagnosis not present

## 2018-07-23 ENCOUNTER — Ambulatory Visit: Payer: Medicare Other | Admitting: Adult Health

## 2018-07-23 DIAGNOSIS — Z961 Presence of intraocular lens: Secondary | ICD-10-CM | POA: Diagnosis not present

## 2018-07-23 DIAGNOSIS — H04123 Dry eye syndrome of bilateral lacrimal glands: Secondary | ICD-10-CM | POA: Diagnosis not present

## 2018-07-30 DIAGNOSIS — M5442 Lumbago with sciatica, left side: Secondary | ICD-10-CM | POA: Diagnosis not present

## 2018-08-15 ENCOUNTER — Telehealth: Payer: Self-pay

## 2018-08-15 NOTE — Telephone Encounter (Signed)
IF patient calls back please schedule her with Janett Billow NP.Pt cancel appt in 07/2018 with NP stating its not needed. THis is per Dr. Leonie Man note.

## 2018-08-20 ENCOUNTER — Ambulatory Visit: Payer: Medicare Other | Admitting: Cardiovascular Disease

## 2018-08-21 ENCOUNTER — Encounter: Payer: Self-pay | Admitting: Cardiovascular Disease

## 2018-08-28 DIAGNOSIS — S93602A Unspecified sprain of left foot, initial encounter: Secondary | ICD-10-CM | POA: Diagnosis not present

## 2018-09-10 DIAGNOSIS — S93602D Unspecified sprain of left foot, subsequent encounter: Secondary | ICD-10-CM | POA: Diagnosis not present

## 2018-09-11 DIAGNOSIS — J209 Acute bronchitis, unspecified: Secondary | ICD-10-CM | POA: Diagnosis not present

## 2018-10-02 DIAGNOSIS — S93602D Unspecified sprain of left foot, subsequent encounter: Secondary | ICD-10-CM | POA: Diagnosis not present

## 2018-10-05 DIAGNOSIS — H10503 Unspecified blepharoconjunctivitis, bilateral: Secondary | ICD-10-CM | POA: Diagnosis not present

## 2019-01-03 DIAGNOSIS — L821 Other seborrheic keratosis: Secondary | ICD-10-CM | POA: Diagnosis not present

## 2019-01-03 DIAGNOSIS — C44319 Basal cell carcinoma of skin of other parts of face: Secondary | ICD-10-CM | POA: Diagnosis not present

## 2019-02-26 DIAGNOSIS — H9202 Otalgia, left ear: Secondary | ICD-10-CM | POA: Diagnosis not present

## 2019-03-07 DIAGNOSIS — M1812 Unilateral primary osteoarthritis of first carpometacarpal joint, left hand: Secondary | ICD-10-CM | POA: Diagnosis not present

## 2019-03-07 DIAGNOSIS — M654 Radial styloid tenosynovitis [de Quervain]: Secondary | ICD-10-CM | POA: Diagnosis not present

## 2019-04-22 ENCOUNTER — Telehealth: Payer: Self-pay | Admitting: Cardiovascular Disease

## 2019-04-22 NOTE — Telephone Encounter (Signed)
° °  Pt c/o of Chest Pain: STAT if CP now or developed within 24 hours  1. Are you having CP right now? yes  2. Are you experiencing any other symptoms (ex. SOB, nausea, vomiting, sweating)? SOB with movement  3. How long have you been experiencing CP? Just started  4. Is your CP continuous or coming and going? continuous  5. Have you taken Nitroglycerin? No. Patient states she doesn't have any ?  Patient feels like she has a knife in her back that is going all the way through her chest. This is a new feeling for her , and she has a family history of heart issues and is nervous

## 2019-04-22 NOTE — Telephone Encounter (Signed)
Agree with plan as outlined.   She should call back if she has further symptoms

## 2019-04-22 NOTE — Telephone Encounter (Signed)
Spoke with pt and over the weekend noted stabbing chest pain that radiated to the back also had a "nausea feeling for a short period " and SOB with walking no other symptoms Offered pt an appt with APP declined at this time Per pt will call if worsens or will go to urgent care. Encouraged pt to go to ED for eval and tx if has increase in S/S Pt also requested an appt with Dr [name] Appt made for 06/17/19 at 9:00 am .Adonis Housekeeper

## 2019-05-03 ENCOUNTER — Other Ambulatory Visit: Payer: Self-pay | Admitting: Family Medicine

## 2019-05-03 DIAGNOSIS — C44511 Basal cell carcinoma of skin of breast: Secondary | ICD-10-CM | POA: Diagnosis not present

## 2019-05-03 DIAGNOSIS — R0600 Dyspnea, unspecified: Secondary | ICD-10-CM | POA: Diagnosis not present

## 2019-05-07 ENCOUNTER — Ambulatory Visit (INDEPENDENT_AMBULATORY_CARE_PROVIDER_SITE_OTHER): Payer: Medicare Other | Admitting: Cardiovascular Disease

## 2019-05-07 ENCOUNTER — Encounter (INDEPENDENT_AMBULATORY_CARE_PROVIDER_SITE_OTHER): Payer: Self-pay

## 2019-05-07 ENCOUNTER — Encounter: Payer: Self-pay | Admitting: Cardiovascular Disease

## 2019-05-07 ENCOUNTER — Other Ambulatory Visit: Payer: Self-pay

## 2019-05-07 VITALS — BP 124/62 | HR 88 | Ht 65.0 in | Wt 157.8 lb

## 2019-05-07 DIAGNOSIS — I208 Other forms of angina pectoris: Secondary | ICD-10-CM | POA: Diagnosis not present

## 2019-05-07 DIAGNOSIS — R0789 Other chest pain: Secondary | ICD-10-CM

## 2019-05-07 DIAGNOSIS — I200 Unstable angina: Secondary | ICD-10-CM | POA: Diagnosis not present

## 2019-05-07 DIAGNOSIS — R079 Chest pain, unspecified: Secondary | ICD-10-CM

## 2019-05-07 MED ORDER — METOPROLOL TARTRATE 25 MG PO TABS: 25.0000 mg | ORAL_TABLET | Freq: Two times a day (BID) | ORAL | 11 refills | Status: DC

## 2019-05-07 MED ORDER — NITROGLYCERIN 0.4 MG SL SUBL: 0.4000 mg | SUBLINGUAL_TABLET | SUBLINGUAL | 6 refills | Status: DC | PRN

## 2019-05-07 NOTE — Progress Notes (Signed)
Cardiology Office Note:    Date:  05/07/2019   ID:  [name], DOB Jun 05, 1941, MRN [id number]  PCP:  [name], MD  Cardiologist:  [name], MD    Referring MD: [name], MD   No chief complaint on file.  Problem list 1. Shortness of breath with exertion - Asthma  2. Hyperlipidemia 3. Hypertension 4. CVA - on Plavix       [name] is a 78 y.o. female with a hx of DOE Hx of TIA years ago, was started on Plavix at that time Has no energy.   Gets short of breath walking to the mailbox and back  ( 100 yards)  No CP or tightness.    No syncope.    Has had a chronic cough / throat clearing . Had blood work at primary MD Alroy Dust)   Has significant vertebral L[place]venia Maxin arterial disease. Dr. Erlinda Hong has given her a minimum  BP goal of 120-150  Dr. Mickle Plumb records from [place] reviewed  Sept. 5, 2019:  Seen for DOE and generalized fatigue Went to [facility] pulmonary and was told she had asthma Takes her inhalers.  Went to her primary care for her fatigue - was told to decrease her carbs   Denies any chest pain  Has headaches and has some dizziness ( history of a TIA in the past )  Does not get any regular exercise   Aug. 25, 2020   Has had worsening DOE recently  Similar to last year  Having severe DOE  Has seen pulmonary md in [place] of chest tightness.  I hear no wheezing on exam today    Past Medical History:  Diagnosis Date  . Asthma   . Breast CA ([place])   . Cancer [facility])    right breast mastectomy  . DDD (degenerative disc disease), cervical   . H/O chest pain 03/24/2006   normal nuclear  study  . Headache   . High cholesterol   . Hypertension   . Mild aortic insufficiency 03/28/2006   echo  . Normal cardiac stress test 2005  . Personal history of chemotherapy   . Personal history of radiation therapy   . PONV (postoperative nausea and vomiting)   . Sinus bradycardia on ECG   . Stroke [facility])     Past  Surgical History:  Procedure Laterality Date  . APPENDECTOMY     "30 years ago"  . BREAST SURGERY    . childbirth     x 2  . CHOLECYSTECTOMY     "15 years ago"  . ESOPHAGOGASTRODUODENOSCOPY N/A 03/04/2014   Procedure: ESOPHAGOGASTRODUODENOSCOPY (EGD);  Surgeon: [name], MD;  Location[facility] ENDOSCOPY;  Service: Endoscopy;  Laterality: N/A;  . MASTECTOMY  1993   right breast  . OTHER SURGICAL HISTORY     hysterectomy  . rectal fissure    . TUBAL LIGATION      Current Medications: Current Meds  Medication Sig  . amLODipine (NORVASC) 5 MG tablet Take 5 mg by mouth daily.  Marland Kitchen atorvastatin (LIPITOR) 80 MG tablet Take 1 tablet (80 mg total) by mouth daily at 6 PM.  . clopidogrel (PLAVIX) 75 MG tablet Take 1 tablet (75 mg total) by mouth daily.  . famotidine (PEPCID) 20 MG tablet Take 20 mg by mouth daily.   . Fluticasone-Salmeterol (ADVAIR) 250-50 MCG/DOSE AEPB Inhale 1 puff into the lungs 2 (two) times daily.  Marland Kitchen loratadine (CLARITIN) 10 MG tablet  Take 10 mg by mouth daily as needed for allergies or rhinitis.   Marland Kitchen nortriptyline (PAMELOR) 10 MG capsule Take 2 capsules (20 mg total) by mouth at bedtime.  . pantoprazole (PROTONIX) 40 MG tablet Take 40 mg by mouth daily as needed.     Allergies:   Doxycycline and Percocet [oxycodone-acetaminophen]   Social History   Socioeconomic History  . Marital status: Married    Spouse name: [name]   . Number of children: 4  . Years of education: 12+  . Highest education level: Not on file  Occupational History  . Not on file  Social Needs  . Financial resource strain: Not on file  . Food insecurity    Worry: Not on file    Inability: Not on file  . Transportation needs    Medical: Not on file    Non-medical: Not on file  Tobacco Use  . Smoking status: Never Smoker  . Smokeless tobacco: Never Used  Substance and Sexual Activity  . Alcohol use: No    Alcohol/week: 0.0 standard drinks  . Drug use: No  . Sexual activity: Not  Currently  Lifestyle  . Physical activity    Days per week: Not on file    Minutes per session: Not on file  . Stress: Not on file  Relationships  . Social Herbalist on phone: Not on file    Gets together: Not on file    Attends religious service: Not on file    Active member of club or organization: Not on file    Attends meetings of clubs or organizations: Not on file    Relationship status: Not on file  Other Topics Concern  . Not on file  Social History Narrative   Right handed, caffeine 2 cups daily, married, 4 kids, Retired.  [employer] Grad.      Family History: The patient's family history includes Cancer (age of onset: 39) in her sister; Heart attack in her father, mother, sister, and sister; Heart disease in her father and mother; Hypertension in her mother, sister, and sister. ROS:   Please see the history of present illness.     All other systems reviewed and are negative.  EKGs/Labs/Other Studies Reviewed:    The following studies were reviewed today:   EKG:    May 07, 2019: Normal sinus rhythm at 88.  Occasional premature atrial contractions.  Old septal MI    Recent Labs: No results found for requested labs within last 8760 hours.  Recent Lipid Panel    Component Value Date/Time   CHOL 107 03/23/2016 0900   TRIG 91 03/23/2016 0900   HDL 49 03/23/2016 0900   CHOLHDL 2.2 03/23/2016 0900   CHOLHDL 6.1 04/28/2015 0645   VLDL 60 (H) 04/28/2015 0645   LDLCALC 40 03/23/2016 0900    Physical Exam: Blood pressure 124/62, pulse 88, height 5\' 5"  (1.651 m), weight 157 lb 12.8 oz (71.6 kg), SpO2 95 %.  GEN:  Well nourished, well developed in no acute distress HEENT: Normal NECK: No JVD; No carotid bruits LYMPHATICS: No lymphadenopathy CARDIAC: RRR   RESPIRATORY:  Clear to auscultation , no wheezing  ABDOMEN: Soft, non-tender, non-distended MUSCULOSKELETAL:  No edema; No deformity  SKIN: Warm and dry NEUROLOGIC:  Alert and oriented x 3    ASSESSMENT:    1. Chest pain of uncertain etiology   2. Anginal equivalent ([place])   3. Unstable angina pectoris ([place])    PLAN:  1. Shortness of breath with exertion:  i'm concerned that this is an angina equivalent.  She was given the dx of asthma but has no wheezing on exam . Will try metoprolol 25 BID to help slow her HR  Will get a cornoary CT angiogram.   Will prescribe NTG to take as needed.   Office visit in 3 months  2. Hypertension:   BP is ok .    3. Hyperlipidemia: lipids looked good at last check   In order of problems listed above: Medication Adjustments/Labs and Tests Ordered: Current medicines are reviewed at length with the patient today.  Concerns regarding medicines are outlined above.  Orders Placed This Encounter  Procedures  . CT CORONARY MORPH W/CTA COR W/SCORE W/CA W/CM &/OR WO/CM  . CT CORONARY FRACTIONAL FLOW RESERVE DATA PREP  . CT CORONARY FRACTIONAL FLOW RESERVE FLUID ANALYSIS  . Basic Metabolic Panel (BMET)  . EKG 12-Lead   Meds ordered this encounter  Medications  . nitroGLYCERIN (NITROSTAT) 0.4 MG SL tablet    Sig: Place 1 tablet (0.4 mg total) under the tongue every 5 (five) minutes as needed for chest pain.    Dispense:  25 tablet    Refill:  6  . metoprolol tartrate (LOPRESSOR) 25 MG tablet    Sig: Take 1 tablet (25 mg total) by mouth 2 (two) times daily.    Dispense:  60 tablet    Refill:  11     Signed, [name], MD  05/07/2019 5:05 PM    [place]

## 2019-05-07 NOTE — Patient Instructions (Addendum)
Medication Instructions:  Your physician has recommended you make the following change in your medication:  START Metoprolol tartrate (Lopressor) 25 mg twice daily USE Nitroglycerin 0.4 mg under your tongue when you have chest tightness. You may repeat the dose 2 more times if needed for a total of 3 pills in 15 minutes. If chest pain persists, call 911 or go to ED  If you need a refill on your cardiac medications before your next appointment, please call your pharmacy.   Lab work: Your physician recommends that you return for lab work (basic metabolic panel) in: 1 week before your CT   Testing/Procedures: Your cardiac CT will be scheduled at one of the below locations:   [facility] [address] [place], [place] [address] [phone] [place] [address] [place], [place] [address] [phone]  Please arrive at the [facility] main entrance of [facility] 30-45 minutes prior to test start time. Proceed to the [facility] Radiology Department (first floor) to check-in and test prep.  Please follow these instructions carefully (unless otherwise directed):   On the Night Before the Test: . Be sure to Drink plenty of water. . Do not consume any caffeinated/decaffeinated beverages or chocolate 12 hours prior to your test. . Do not take any antihistamines 12 hours prior to your test.   On the Day of the Test: . Drink plenty of water. Do not drink any water within one hour of the test. . Do not eat any food 4 hours prior to the test. . You may take your regular medications prior to the test.  . Take metoprolol (Lopressor) two hours prior to test. . FEMALES- please wear underwire-free bra if available       After the Test: . Drink plenty of water. . After receiving IV contrast, you may experience a mild flushed feeling. This is normal. . On occasion, you may experience a mild rash up to 24 hours  after the test. This is not dangerous. If this occurs, you can take Benadryl 25 mg and increase your fluid intake. . If you experience trouble breathing, this can be serious. If it is severe call 911 IMMEDIATELY. If it is mild, please call our office. . If you take any of these medications: Glipizide/Metformin, Avandament, Glucavance, please do not take 48 hours after completing test.    Please contact the cardiac imaging nurse navigator should you have any questions/concerns Marchia Bond, RN Navigator Cardiac [facility] [phone] Office  [phone] Cell    Follow-Up: Your physician recommends that you schedule a follow-up appointment on Monday Nov. 30 at 2:00 pm

## 2019-05-13 ENCOUNTER — Telehealth (HOSPITAL_COMMUNITY): Payer: Self-pay | Admitting: Emergency Medicine

## 2019-05-13 NOTE — Telephone Encounter (Signed)
Reaching out to patient to offer assistance regarding upcoming cardiac imaging study; pt verbalizes understanding of appt date/time, parking situation and where to check in, pre-test NPO status and medications ordered, and verified current allergies; name and call back number provided for further questions should they arise Marchia Bond RN [facility] [phone] office [phone] cell  Pt stating she got nauseated last time she got contrast, requesting antinausea medication prior to test.

## 2019-05-14 ENCOUNTER — Other Ambulatory Visit: Payer: Self-pay

## 2019-05-14 ENCOUNTER — Ambulatory Visit (HOSPITAL_COMMUNITY)
Admission: RE | Admit: 2019-05-14 | Discharge: 2019-05-14 | Disposition: A | Discharge status: Home / Self Care | Payer: Medicare Other | Source: Ambulatory Visit | Attending: Cardiovascular Disease | Admitting: Cardiovascular Disease

## 2019-05-14 DIAGNOSIS — I200 Unstable angina: Secondary | ICD-10-CM

## 2019-05-14 DIAGNOSIS — I700 Atherosclerosis of aorta: Secondary | ICD-10-CM | POA: Diagnosis not present

## 2019-05-14 LAB — POCT I-STAT CREATININE: Creatinine, Ser: 0.8 mg/dL (ref 0.44–1.00)

## 2019-05-14 MED ORDER — METOPROLOL TARTRATE 5 MG/5ML IV SOLN
5.0000 mg | INTRAVENOUS | Status: DC | PRN
  Filled 2019-05-14: qty 5

## 2019-05-14 MED ORDER — NITROGLYCERIN 0.4 MG SL SUBL
SUBLINGUAL_TABLET | SUBLINGUAL | Status: AC
  Filled 2019-05-14: qty 2

## 2019-05-14 MED ORDER — IOHEXOL 350 MG/ML SOLN
80.0000 mL | Freq: Once | INTRAVENOUS | Status: AC | PRN
  Administered 2019-05-14: 80 mL via INTRAVENOUS

## 2019-05-14 MED ORDER — NITROGLYCERIN 0.4 MG SL SUBL
0.8000 mg | SUBLINGUAL_TABLET | SUBLINGUAL | Status: DC | PRN
  Administered 2019-05-14: 0.8 mg via SUBLINGUAL
  Filled 2019-05-14: qty 25

## 2019-05-15 ENCOUNTER — Telehealth: Payer: Self-pay | Admitting: Cardiovascular Disease

## 2019-05-15 NOTE — Telephone Encounter (Signed)
Advised pt that we would call her once her CT results have been reviewed. She verbalized understanding.

## 2019-05-15 NOTE — Telephone Encounter (Signed)
Follow Up:     Pt wants to know if her CT results are ready from yesterday.

## 2019-05-16 ENCOUNTER — Telehealth: Payer: Self-pay | Admitting: Nurse Practitioner

## 2019-05-16 DIAGNOSIS — I2089 Other forms of angina pectoris: Secondary | ICD-10-CM

## 2019-05-16 DIAGNOSIS — I25118 Atherosclerotic heart disease of native coronary artery with other forms of angina pectoris: Secondary | ICD-10-CM

## 2019-05-16 DIAGNOSIS — R5383 Other fatigue: Secondary | ICD-10-CM

## 2019-05-16 DIAGNOSIS — I2510 Atherosclerotic heart disease of native coronary artery without angina pectoris: Secondary | ICD-10-CM | POA: Diagnosis not present

## 2019-05-16 DIAGNOSIS — I208 Other forms of angina pectoris: Secondary | ICD-10-CM

## 2019-05-16 MED ORDER — ISOSORBIDE MONONITRATE ER 30 MG PO TB24: 30.0000 mg | ORAL_TABLET | Freq: Every day | ORAL | 11 refills | Status: DC

## 2019-05-16 NOTE — Telephone Encounter (Signed)
-----   Message from Thayer Headings, MD sent at 05/16/2019  2:17 PM EDT ----- She may have a stenosis in her mid/distal LAD. She is better after started the metoprolol.   Will start Imdur 30 mg a day  Lets set up a virtual visit in 1-2 weeks . I would have a low threshold to do a cath

## 2019-05-16 NOTE — Telephone Encounter (Signed)
Reviewed results of coronary CT with patient which showed mild non-obstructive CAD. Patient was advised to continue current medications and keep her follow up appointment for 11/30. She verbalized understanding and agreement and thanked me for the call.

## 2019-05-16 NOTE — Telephone Encounter (Signed)
Follow-up:  Patient was calling to see if her CT results were ready yet. Please call when they are available.

## 2019-05-16 NOTE — Telephone Encounter (Signed)
Spoke with patient to get local pharmacy for Isosorbide 30 mg  [place]. She states she will return home Sunday. I advised her to use Tylenol if headache occurs with starting isosorbide. Echo has been ordered and message sent to scheduling to get echo prior to virtual appointment with Dr. [name] on 9/11. I advised patient to call back with questions or concerns prior to appointment. She verbalized understanding and agreement and thanked me for the call.

## 2019-05-23 ENCOUNTER — Ambulatory Visit
Admission: RE | Admit: 2019-05-23 | Discharge: 2019-05-23 | Disposition: A | Discharge status: Home / Self Care | Payer: Medicare Other | Source: Ambulatory Visit | Attending: Family Medicine | Admitting: Family Medicine

## 2019-05-23 ENCOUNTER — Other Ambulatory Visit: Payer: Self-pay | Admitting: Family Medicine

## 2019-05-23 ENCOUNTER — Other Ambulatory Visit: Payer: Self-pay

## 2019-05-23 ENCOUNTER — Ambulatory Visit (HOSPITAL_COMMUNITY): Payer: Medicare Other | Attending: Internal Medicine

## 2019-05-23 DIAGNOSIS — R0600 Dyspnea, unspecified: Secondary | ICD-10-CM

## 2019-05-23 DIAGNOSIS — R5383 Other fatigue: Secondary | ICD-10-CM | POA: Diagnosis not present

## 2019-05-23 DIAGNOSIS — I25118 Atherosclerotic heart disease of native coronary artery with other forms of angina pectoris: Secondary | ICD-10-CM | POA: Diagnosis not present

## 2019-05-23 DIAGNOSIS — I208 Other forms of angina pectoris: Secondary | ICD-10-CM

## 2019-05-24 ENCOUNTER — Telehealth (INDEPENDENT_AMBULATORY_CARE_PROVIDER_SITE_OTHER): Payer: Medicare Other | Admitting: Cardiovascular Disease

## 2019-05-24 ENCOUNTER — Encounter: Payer: Self-pay | Admitting: Cardiovascular Disease

## 2019-05-24 DIAGNOSIS — I200 Unstable angina: Secondary | ICD-10-CM | POA: Diagnosis not present

## 2019-05-24 NOTE — Patient Instructions (Addendum)
Medication Instructions:  Your physician recommends that you continue on your current medications as directed. Please refer to the Current Medication list given to you today.  If you need a refill on your cardiac medications before your next appointment, please call your pharmacy.   Lab work: Your physician recommends that you return for lab work on Monday 05/27/19 CBC, BMET  If you have labs (blood work) drawn today and your tests are completely normal, you will receive your results only by: Marland Kitchen MyChart Message (if you have MyChart) OR . A paper copy in the mail If you have any lab test that is abnormal or we need to change your treatment, we will call you to review the results.  Testing/Procedures:    [name]  05/24/2019  You are scheduled for a Cardiac Catheterization on Thursday, September 17 with Dr. [name].  1. Please arrive at the [facility] (Main Entrance A) at [facility]: [address] [place], [place] [address] at 8:30 AM (This time is two hours before your procedure to ensure your preparation). Free valet parking service is available.   Special note: Every effort is made to have your procedure done on time. Please understand that emergencies sometimes delay scheduled procedures.  2. Diet: Do not eat solid foods after midnight.  The patient may have clear liquids until 5am upon the day of the procedure.  3. Labs: You will need to have blood drawn on Monday, September 14 (Before your COVID test) at [facility] at [facility]. [address], [place]      Phone: [phone]. You do not need to be fasting.  Your Pre-procedure COVID-19 Testing will be done on Mon. 05/27/19 at 1:55 at [facility] at [id number], [place], [place] [address]. Once you arrive at the testing site, stay in the right hand lane, go under the building overhang not the tent. If you are tested under the tent your results may not be  back before your procedure. Please be on time for your appointment.  After your swab you will be given a mask to wear and instructed to go home and quarantine/no visitors until after your procedure. If you test positive you will be notified and your procedure will be cancelled.    4. Medication instructions in preparation for your procedure:   Contrast Allergy: No  On the morning of your procedure, take your Plavix/Clopidogrel and any morning medicines NOT listed above.  You may use sips of water.  5. Plan for one night stay--bring personal belongings. 6. Bring a current list of your medications and current insurance cards. 7. You MUST have a responsible person to drive you home. 8. Someone MUST be with you the first 24 hours after you arrive home or your discharge will be delayed. 9. Please wear clothes that are easy to get on and off and wear slip-on shoes.  Thank you for allowing [place] to care for you!   -- [place] [facility]  Follow-Up: At [facility], you and your health needs are our priority.  As part of our continuing mission to provide you with exceptional heart care, we have created designated Provider Care Teams.  These Care Teams include your primary Cardiologist (physician) and Advanced Practice Providers (APPs -  Physician Assistants and Nurse Practitioners) who all work together to provide you with the care you need, when you need it. You will need a follow up appointment  in:  6 weeks.  Please call our office 2 months in advance to schedule this appointment.  You may see [name], MD or one of the following Advanced Practice Providers on your designated Care Team: Richardson Dopp, PA-C [place], [place] . Daune Perch, NP  Any Other Special Instructions Will Be Listed Below (If Applicable)

## 2019-05-24 NOTE — H&P (View-Only) (Signed)
Cardiology Office Note:    Date:  05/24/2019   ID:  [name], DOB 06/12/1941, MRN [id number]  PCP:  [name], MD  Cardiologist:  [name], MD  Electrophysiologist:  None   Referring MD: [name], MD   Chief Complaint  Patient presents with  . Chest Pain    Telephone office visit     [name] is a 78 y.o. female with a hx of CP. She had a coronary CT angiogram performed on September 1.  She was found to have moderate coronary artery disease.  FFR evaluation was essentially normal but in the distal aspect of the LAD the FFR was 0.78 which suggests a possible stenosis in the mid/distal LAD. We started her on Imdur and metoprolol Still has some DOE but is better.   Past Medical History:  Diagnosis Date  . Asthma   . Breast CA ([place])   . Cancer [facility])    right breast mastectomy  . DDD (degenerative disc disease), cervical   . H/O chest pain 03/24/2006   normal nuclear  study  . Headache   . High cholesterol   . Hypertension   . Mild aortic insufficiency 03/28/2006   echo  . Normal cardiac stress test 2005  . Personal history of chemotherapy   . Personal history of radiation therapy   . PONV (postoperative nausea and vomiting)   . Sinus bradycardia on ECG   . Stroke [facility])     Past Surgical History:  Procedure Laterality Date  . APPENDECTOMY     "30 years ago"  . BREAST SURGERY    . childbirth     x 2  . CHOLECYSTECTOMY     "15 years ago"  . ESOPHAGOGASTRODUODENOSCOPY N/A 03/04/2014   Procedure: ESOPHAGOGASTRODUODENOSCOPY (EGD);  Surgeon: [name], MD;  Location: [facility] ENDOSCOPY;  Service: Endoscopy;  Laterality: N/A;  . MASTECTOMY  1993   right breast  . OTHER SURGICAL HISTORY     hysterectomy  . rectal fissure    . TUBAL LIGATION      Current Medications: Current Meds  Medication Sig  . amLODipine (NORVASC) 5 MG tablet Take 5 mg by mouth daily.  Marland Kitchen atorvastatin (LIPITOR) 80 MG tablet Take 1 tablet (80 mg total) by mouth daily  at 6 PM.  . clopidogrel (PLAVIX) 75 MG tablet Take 1 tablet (75 mg total) by mouth daily.  . famotidine (PEPCID) 20 MG tablet Take 20 mg by mouth daily.   . Fluticasone-Salmeterol (ADVAIR) 250-50 MCG/DOSE AEPB Inhale 1 puff into the lungs 2 (two) times daily.  . isosorbide mononitrate (IMDUR) 30 MG 24 hr tablet Take 1 tablet (30 mg total) by mouth daily.  Marland Kitchen loratadine (CLARITIN) 10 MG tablet Take 10 mg by mouth daily as needed for allergies or rhinitis.   . metoprolol tartrate (LOPRESSOR) 25 MG tablet Take 1 tablet (25 mg total) by mouth 2 (two) times daily.  . nitroGLYCERIN (NITROSTAT) 0.4 MG SL tablet Place 1 tablet (0.4 mg total) under the tongue every 5 (five) minutes as needed for chest pain.  . nortriptyline (PAMELOR) 10 MG capsule Take 2 capsules (20 mg total) by mouth at bedtime.  . pantoprazole (PROTONIX) 40 MG tablet Take 40 mg by mouth daily as needed.     Allergies:   Doxycycline and Percocet [oxycodone-acetaminophen]   Social History   Socioeconomic History  . Marital status: Married    Spouse name: [name]   . Number of children: 4  . Years  of education: 12+  . Highest education level: Not on file  Occupational History  . Not on file  Social Needs  . Financial resource strain: Not on file  . Food insecurity    Worry: Not on file    Inability: Not on file  . Transportation needs    Medical: Not on file    Non-medical: Not on file  Tobacco Use  . Smoking status: Never Smoker  . Smokeless tobacco: Never Used  Substance and Sexual Activity  . Alcohol use: No    Alcohol/week: 0.0 standard drinks  . Drug use: No  . Sexual activity: Not Currently  Lifestyle  . Physical activity    Days per week: Not on file    Minutes per session: Not on file  . Stress: Not on file  Relationships  . Social Herbalist on phone: Not on file    Gets together: Not on file    Attends religious service: Not on file    Active member of club or organization: Not on file     Attends meetings of clubs or organizations: Not on file    Relationship status: Not on file  Other Topics Concern  . Not on file  Social History Narrative   Right handed, caffeine 2 cups daily, married, 4 kids, Retired.  Hs Grad.      Family History: The patient's family history includes Cancer (age of onset: 53) in her sister; Heart attack in her father, mother, sister, and sister; Heart disease in her father and mother; Hypertension in her mother, sister, and sister.  ROS:   Please see the history of present illness.     All other systems reviewed and are negative.  EKGs/Labs/Other Studies Reviewed:    The following studies were reviewed today:   EKG:    Recent Labs: 05/14/2019: Creatinine, Ser 0.80  Recent Lipid Panel    Component Value Date/Time   CHOL 107 03/23/2016 0900   TRIG 91 03/23/2016 0900   HDL 49 03/23/2016 0900   CHOLHDL 2.2 03/23/2016 0900   CHOLHDL 6.1 04/28/2015 0645   VLDL 60 (H) 04/28/2015 0645   LDLCALC 40 03/23/2016 0900    Physical Exam:    VS:  BP 130/70   Temp (!) 97.5 F (36.4 C)   Ht 5\' 5"  (1.651 m)   Wt 157 lb (71.2 kg)   BMI 26.13 kg/m     Wt Readings from Last 3 Encounters:  05/24/19 157 lb (71.2 kg)  05/07/19 157 lb 12.8 oz (71.6 kg)  06/21/18 160 lb (72.6 kg)      ASSESSMENT:    1. Unstable angina pectoris ([place])    PLAN:    In order of problems listed above:  1.  Coronary artery disease: She is been found to have at least moderate to severe coronary artery disease involving the mid/distal LAD.  We have started her on isosorbide and metoprolol and she states that her symptoms are somewhat better although she still having severe shortness of breath walking as little as 50 feet.  Given her symptoms and the progressiveness of her shortness of breath, I think our best option is to proceed with heart catheterization.  We discussed the risks, benefits and options of heart catheterization.  She understands and agrees to proceed.  We  will schedule it for next Thursday.  Time spent on office visit 20 minutes.   Medication Adjustments/Labs and Tests Ordered: Current medicines are reviewed at length with the  patient today.  Concerns regarding medicines are outlined above.  Orders Placed This Encounter  Procedures  . CBC  . Basic metabolic panel   No orders of the defined types were placed in this encounter.   Patient Instructions  Medication Instructions:  Your physician recommends that you continue on your current medications as directed. Please refer to the Current Medication list given to you today.  If you need a refill on your cardiac medications before your next appointment, please call your pharmacy.   Lab work: Your physician recommends that you return for lab work on Monday 05/27/19 CBC, BMET  If you have labs (blood work) drawn today and your tests are completely normal, you will receive your results only by: Marland Kitchen MyChart Message (if you have MyChart) OR . A paper copy in the mail If you have any lab test that is abnormal or we need to change your treatment, we will call you to review the results.  Testing/Procedures:    [name]  05/24/2019  You are scheduled for a Cardiac Catheterization on Thursday, September 17 with Dr. [name].  1. Please arrive at the [facility] (Main Entrance A) at [facility]: [address] [place], [place] [address] at 8:30 AM (This time is two hours before your procedure to ensure your preparation). Free valet parking service is available.   Special note: Every effort is made to have your procedure done on time. Please understand that emergencies sometimes delay scheduled procedures.  2. Diet: Do not eat solid foods after midnight.  The patient may have clear liquids until 5am upon the day of the procedure.  3. Labs: You will need to have blood drawn on Monday, September 14 at [facility] at [facility]. [address]   Open: 7:30am - 5pm    Phone: [phone]. You do not need to be fasting.  4. Medication instructions in preparation for your procedure:   Contrast Allergy: No  On the morning of your procedure, take your Plavix/Clopidogrel and any morning medicines NOT listed above.  You may use sips of water.  5. Plan for one night stay--bring personal belongings. 6. Bring a current list of your medications and current insurance cards. 7. You MUST have a responsible person to drive you home. 8. Someone MUST be with you the first 24 hours after you arrive home or your discharge will be delayed. 9. Please wear clothes that are easy to get on and off and wear slip-on shoes.  Thank you for allowing [place] to care for you!   -- [place] [facility]  Follow-Up: At [facility], you and your health needs are our priority.  As part of our continuing mission to provide you with exceptional heart care, we have created designated Provider Care Teams.  These Care Teams include your primary Cardiologist (physician) and Advanced Practice Providers (APPs -  Physician Assistants and Nurse Practitioners) who all work together to provide you with the care you need, when you need it. You will need a follow up appointment in:  6 weeks.  Please call our office 2 months in advance to schedule this appointment.  You may see [name], MD or one of the following Advanced Practice Providers on your designated Care Team: Richardson Dopp, PA-C [place], [place] . Daune Perch, NP  Any Other Special Instructions Will Be Listed Below (If Applicable)       Signed, [name], MD  05/24/2019 1:42  PM    [facility]

## 2019-05-24 NOTE — Progress Notes (Signed)
Cardiology Office Note:    Date:  05/24/2019   ID:  [name], DOB 1940-10-31, MRN [id number]  PCP:  [name], MD  Cardiologist:  [name], MD  Electrophysiologist:  None   Referring MD: [name], MD   Chief Complaint  Patient presents with  . Chest Pain    Telephone office visit     [name] is a 78 y.o. female with a hx of CP. She had a coronary CT angiogram performed on September 1.  She was found to have moderate coronary artery disease.  FFR evaluation was essentially normal but in the distal aspect of the LAD the FFR was 0.78 which suggests a possible stenosis in the mid/distal LAD. We started her on Imdur and metoprolol Still has some DOE but is better.   Past Medical History:  Diagnosis Date  . Asthma   . Breast CA ([place])   . Cancer [facility])    right breast mastectomy  . DDD (degenerative disc disease), cervical   . H/O chest pain 03/24/2006   normal nuclear  study  . Headache   . High cholesterol   . Hypertension   . Mild aortic insufficiency 03/28/2006   echo  . Normal cardiac stress test 2005  . Personal history of chemotherapy   . Personal history of radiation therapy   . PONV (postoperative nausea and vomiting)   . Sinus bradycardia on ECG   . Stroke [facility])     Past Surgical History:  Procedure Laterality Date  . APPENDECTOMY     "30 years ago"  . BREAST SURGERY    . childbirth     x 2  . CHOLECYSTECTOMY     "15 years ago"  . ESOPHAGOGASTRODUODENOSCOPY N/A 03/04/2014   Procedure: ESOPHAGOGASTRODUODENOSCOPY (EGD);  Surgeon: [name], MD;  Location: [facility] ENDOSCOPY;  Service: Endoscopy;  Laterality: N/A;  . MASTECTOMY  1993   right breast  . OTHER SURGICAL HISTORY     hysterectomy  . rectal fissure    . TUBAL LIGATION      Current Medications: Current Meds  Medication Sig  . amLODipine (NORVASC) 5 MG tablet Take 5 mg by mouth daily.  Marland Kitchen atorvastatin (LIPITOR) 80 MG tablet Take 1 tablet (80 mg total) by mouth daily  at 6 PM.  . clopidogrel (PLAVIX) 75 MG tablet Take 1 tablet (75 mg total) by mouth daily.  . famotidine (PEPCID) 20 MG tablet Take 20 mg by mouth daily.   . Fluticasone-Salmeterol (ADVAIR) 250-50 MCG/DOSE AEPB Inhale 1 puff into the lungs 2 (two) times daily.  . isosorbide mononitrate (IMDUR) 30 MG 24 hr tablet Take 1 tablet (30 mg total) by mouth daily.  Marland Kitchen loratadine (CLARITIN) 10 MG tablet Take 10 mg by mouth daily as needed for allergies or rhinitis.   . metoprolol tartrate (LOPRESSOR) 25 MG tablet Take 1 tablet (25 mg total) by mouth 2 (two) times daily.  . nitroGLYCERIN (NITROSTAT) 0.4 MG SL tablet Place 1 tablet (0.4 mg total) under the tongue every 5 (five) minutes as needed for chest pain.  . nortriptyline (PAMELOR) 10 MG capsule Take 2 capsules (20 mg total) by mouth at bedtime.  . pantoprazole (PROTONIX) 40 MG tablet Take 40 mg by mouth daily as needed.     Allergies:   Doxycycline and Percocet [oxycodone-acetaminophen]   Social History   Socioeconomic History  . Marital status: Married    Spouse name: [name]   . Number of children: 4  . Years  of education: 12+  . Highest education level: Not on file  Occupational History  . Not on file  Social Needs  . Financial resource strain: Not on file  . Food insecurity    Worry: Not on file    Inability: Not on file  . Transportation needs    Medical: Not on file    Non-medical: Not on file  Tobacco Use  . Smoking status: Never Smoker  . Smokeless tobacco: Never Used  Substance and Sexual Activity  . Alcohol use: No    Alcohol/week: 0.0 standard drinks  . Drug use: No  . Sexual activity: Not Currently  Lifestyle  . Physical activity    Days per week: Not on file    Minutes per session: Not on file  . Stress: Not on file  Relationships  . Social Herbalist on phone: Not on file    Gets together: Not on file    Attends religious service: Not on file    Active member of club or organization: Not on file     Attends meetings of clubs or organizations: Not on file    Relationship status: Not on file  Other Topics Concern  . Not on file  Social History Narrative   Right handed, caffeine 2 cups daily, married, 4 kids, Retired.  Hs Grad.      Family History: The patient's family history includes Cancer (age of onset: 59) in her sister; Heart attack in her father, mother, sister, and sister; Heart disease in her father and mother; Hypertension in her mother, sister, and sister.  ROS:   Please see the history of present illness.     All other systems reviewed and are negative.  EKGs/Labs/Other Studies Reviewed:    The following studies were reviewed today:   EKG:    Recent Labs: 05/14/2019: Creatinine, Ser 0.80  Recent Lipid Panel    Component Value Date/Time   CHOL 107 03/23/2016 0900   TRIG 91 03/23/2016 0900   HDL 49 03/23/2016 0900   CHOLHDL 2.2 03/23/2016 0900   CHOLHDL 6.1 04/28/2015 0645   VLDL 60 (H) 04/28/2015 0645   LDLCALC 40 03/23/2016 0900    Physical Exam:    VS:  BP 130/70   Temp (!) 97.5 F (36.4 C)   Ht 5\' 5"  (1.651 m)   Wt 157 lb (71.2 kg)   BMI 26.13 kg/m     Wt Readings from Last 3 Encounters:  05/24/19 157 lb (71.2 kg)  05/07/19 157 lb 12.8 oz (71.6 kg)  06/21/18 160 lb (72.6 kg)      ASSESSMENT:    1. Unstable angina pectoris ([place])    PLAN:    In order of problems listed above:  1.  Coronary artery disease: She is been found to have at least moderate to severe coronary artery disease involving the mid/distal LAD.  We have started her on isosorbide and metoprolol and she states that her symptoms are somewhat better although she still having severe shortness of breath walking as little as 50 feet.  Given her symptoms and the progressiveness of her shortness of breath, I think our best option is to proceed with heart catheterization.  We discussed the risks, benefits and options of heart catheterization.  She understands and agrees to proceed.  We  will schedule it for next Thursday.  Time spent on office visit 20 minutes.   Medication Adjustments/Labs and Tests Ordered: Current medicines are reviewed at length with the  patient today.  Concerns regarding medicines are outlined above.  Orders Placed This Encounter  Procedures  . CBC  . Basic metabolic panel   No orders of the defined types were placed in this encounter.   Patient Instructions  Medication Instructions:  Your physician recommends that you continue on your current medications as directed. Please refer to the Current Medication list given to you today.  If you need a refill on your cardiac medications before your next appointment, please call your pharmacy.   Lab work: Your physician recommends that you return for lab work on Monday 05/27/19 CBC, BMET  If you have labs (blood work) drawn today and your tests are completely normal, you will receive your results only by: Marland Kitchen MyChart Message (if you have MyChart) OR . A paper copy in the mail If you have any lab test that is abnormal or we need to change your treatment, we will call you to review the results.  Testing/Procedures:    AVERYL ZERINGUE  05/24/2019  You are scheduled for a Cardiac Catheterization on Thursday, September 17 with Dr. [name].  1. Please arrive at the [facility] (Main Entrance A) at [facility]: [address] [place], [place] [address] at 8:30 AM (This time is two hours before your procedure to ensure your preparation). Free valet parking service is available.   Special note: Every effort is made to have your procedure done on time. Please understand that emergencies sometimes delay scheduled procedures.  2. Diet: Do not eat solid foods after midnight.  The patient may have clear liquids until 5am upon the day of the procedure.  3. Labs: You will need to have blood drawn on Monday, September 14 at [facility] at [facility]. [address]   Open: 7:30am - 5pm    Phone: [phone]. You do not need to be fasting.  4. Medication instructions in preparation for your procedure:   Contrast Allergy: No  On the morning of your procedure, take your Plavix/Clopidogrel and any morning medicines NOT listed above.  You may use sips of water.  5. Plan for one night stay--bring personal belongings. 6. Bring a current list of your medications and current insurance cards. 7. You MUST have a responsible person to drive you home. 8. Someone MUST be with you the first 24 hours after you arrive home or your discharge will be delayed. 9. Please wear clothes that are easy to get on and off and wear slip-on shoes.  Thank you for allowing [place] to care for you!   -- [place] [facility]  Follow-Up: At [facility], you and your health needs are our priority.  As part of our continuing mission to provide you with exceptional heart care, we have created designated Provider Care Teams.  These Care Teams include your primary Cardiologist (physician) and Advanced Practice Providers (APPs -  Physician Assistants and Nurse Practitioners) who all work together to provide you with the care you need, when you need it. You will need a follow up appointment in:  6 weeks.  Please call our office 2 months in advance to schedule this appointment.  You may see [name], MD or one of the following Advanced Practice Providers on your designated Care Team: Richardson Dopp, PA-C [place], [place] . Daune Perch, NP  Any Other Special Instructions Will Be Listed Below (If Applicable)       Signed, [name], MD  05/24/2019 1:42  PM    [facility]

## 2019-05-27 ENCOUNTER — Other Ambulatory Visit (HOSPITAL_COMMUNITY)
Admission: RE | Admit: 2019-05-27 | Discharge: 2019-05-27 | Disposition: A | Discharge status: Home / Self Care | Payer: Medicare Other | Source: Ambulatory Visit | Attending: Interventional Cardiology | Admitting: Interventional Cardiology

## 2019-05-27 ENCOUNTER — Other Ambulatory Visit: Payer: Self-pay

## 2019-05-27 ENCOUNTER — Other Ambulatory Visit: Payer: Medicare Other | Admitting: *Deleted

## 2019-05-27 DIAGNOSIS — Z01812 Encounter for preprocedural laboratory examination: Secondary | ICD-10-CM | POA: Diagnosis not present

## 2019-05-27 DIAGNOSIS — Z20828 Contact with and (suspected) exposure to other viral communicable diseases: Secondary | ICD-10-CM | POA: Insufficient documentation

## 2019-05-27 DIAGNOSIS — I200 Unstable angina: Secondary | ICD-10-CM | POA: Diagnosis not present

## 2019-05-27 LAB — BASIC METABOLIC PANEL
BUN/Creatinine Ratio: 14 (ref 12–28)
BUN: 12 mg/dL (ref 8–27)
CO2: 25 mmol/L (ref 20–29)
Calcium: 9.3 mg/dL (ref 8.7–10.3)
Chloride: 100 mmol/L (ref 96–106)
Creatinine, Ser: 0.85 mg/dL (ref 0.57–1.00)
GFR calc Af Amer: 75 mL/min/{1.73_m2} (ref >59)
GFR calc non Af Amer: 65 mL/min/{1.73_m2} (ref >59)
Glucose: 173 mg/dL — ABNORMAL HIGH (ref 65–99)
Potassium: 4.2 mmol/L (ref 3.5–5.2)
Sodium: 141 mmol/L (ref 134–144)

## 2019-05-27 LAB — CBC
Hematocrit: 37.6 % (ref 34.0–46.6)
Hemoglobin: 12.5 g/dL (ref 11.1–15.9)
MCH: 30.3 pg (ref 26.6–33.0)
MCHC: 33.2 g/dL (ref 31.5–35.7)
MCV: 91 fL (ref 79–97)
Platelets: 237 10*3/uL (ref 150–450)
RBC: 4.13 x10E6/uL (ref 3.77–5.28)
RDW: 13.3 % (ref 11.7–15.4)
WBC: 6.2 10*3/uL (ref 3.4–10.8)

## 2019-05-28 LAB — NOVEL CORONAVIRUS, NAA (HOSP ORDER, SEND-OUT TO REF LAB; TAT 18-24 HRS): SARS-CoV-2, NAA: NOT DETECTED

## 2019-05-29 ENCOUNTER — Telehealth: Payer: Self-pay | Admitting: *Deleted

## 2019-05-29 NOTE — Telephone Encounter (Signed)
Patient is agreeable and verbalized understanding to all instructions listed below.   Pt contacted pre-catheterization scheduled at [facility] for: Right & left heart catheterization with Dr. Irish Lack on 05/30/2019.  Verified arrival time and place: [facility]) at: 8:30 am.   No solid food after midnight prior to cath, clear liquids until 5 AM day of procedure. Contrast allergy: None Verified no diabetes medications.  AM meds can be  taken pre-cath with sip of water including: Plavix 75 mg   Confirmed patient has responsible person to drive home post procedure and observe 24 hours after arriving home: yes  Currently, due to Covid-19 pandemic, only one support person will be allowed with patient. Must be the same support person for that patient's entire stay, will be screened and required to wear a mask. They will be asked to wait in the waiting room for the duration of the patient's stay.  Patients are required to wear a mask when they enter the hospital.       COVID-19 Pre-Screening Questions:  . In the past 7 to 10 days have you had a cough,  shortness of breath, headache, congestion, fever (100 or greater) body aches, chills, sore throat, or sudden loss of taste or sense of smell? No . Have you been around anyone with known Covid 19? No . Have you been around anyone who is awaiting Covid 19 test results in the past 7 to 10 days? No . Have you been around anyone who has been exposed to Covid 19, or has mentioned symptoms of Covid 19 within the past 7 to 10 days? No

## 2019-05-30 ENCOUNTER — Other Ambulatory Visit: Payer: Self-pay

## 2019-05-30 ENCOUNTER — Ambulatory Visit (HOSPITAL_COMMUNITY)
Admission: RE | Admit: 2019-05-30 | Discharge: 2019-05-30 | Disposition: A | Discharge status: Home / Self Care | Payer: Medicare Other | Attending: Interventional Cardiology | Admitting: Interventional Cardiology

## 2019-05-30 ENCOUNTER — Encounter (HOSPITAL_COMMUNITY): Payer: Self-pay | Admitting: Interventional Cardiology

## 2019-05-30 ENCOUNTER — Encounter (HOSPITAL_COMMUNITY)
Admission: RE | Disposition: A | Discharge status: Home / Self Care | Payer: Medicare Other | Source: Home / Self Care | Attending: Interventional Cardiology

## 2019-05-30 DIAGNOSIS — Z90710 Acquired absence of both cervix and uterus: Secondary | ICD-10-CM | POA: Insufficient documentation

## 2019-05-30 DIAGNOSIS — Z7951 Long term (current) use of inhaled steroids: Secondary | ICD-10-CM | POA: Diagnosis not present

## 2019-05-30 DIAGNOSIS — Z881 Allergy status to other antibiotic agents status: Secondary | ICD-10-CM | POA: Diagnosis not present

## 2019-05-30 DIAGNOSIS — Z885 Allergy status to narcotic agent status: Secondary | ICD-10-CM | POA: Insufficient documentation

## 2019-05-30 DIAGNOSIS — Z8673 Personal history of transient ischemic attack (TIA), and cerebral infarction without residual deficits: Secondary | ICD-10-CM | POA: Insufficient documentation

## 2019-05-30 DIAGNOSIS — I200 Unstable angina: Secondary | ICD-10-CM | POA: Diagnosis present

## 2019-05-30 DIAGNOSIS — J45909 Unspecified asthma, uncomplicated: Secondary | ICD-10-CM | POA: Insufficient documentation

## 2019-05-30 DIAGNOSIS — Z8249 Family history of ischemic heart disease and other diseases of the circulatory system: Secondary | ICD-10-CM | POA: Diagnosis not present

## 2019-05-30 DIAGNOSIS — I10 Essential (primary) hypertension: Secondary | ICD-10-CM | POA: Insufficient documentation

## 2019-05-30 DIAGNOSIS — Z79899 Other long term (current) drug therapy: Secondary | ICD-10-CM | POA: Diagnosis not present

## 2019-05-30 DIAGNOSIS — I25110 Atherosclerotic heart disease of native coronary artery with unstable angina pectoris: Secondary | ICD-10-CM | POA: Insufficient documentation

## 2019-05-30 DIAGNOSIS — Z9011 Acquired absence of right breast and nipple: Secondary | ICD-10-CM | POA: Insufficient documentation

## 2019-05-30 DIAGNOSIS — Z7902 Long term (current) use of antithrombotics/antiplatelets: Secondary | ICD-10-CM | POA: Diagnosis not present

## 2019-05-30 DIAGNOSIS — I209 Angina pectoris, unspecified: Secondary | ICD-10-CM

## 2019-05-30 DIAGNOSIS — I25119 Atherosclerotic heart disease of native coronary artery with unspecified angina pectoris: Secondary | ICD-10-CM

## 2019-05-30 DIAGNOSIS — E7800 Pure hypercholesterolemia, unspecified: Secondary | ICD-10-CM | POA: Insufficient documentation

## 2019-05-30 HISTORY — PX: RIGHT/LEFT HEART CATH AND CORONARY ANGIOGRAPHY: CATH118266

## 2019-05-30 SURGERY — RIGHT/LEFT HEART CATH AND CORONARY ANGIOGRAPHY
Anesthesia: LOCAL

## 2019-05-30 MED ORDER — SODIUM CHLORIDE 0.9 % WEIGHT BASED INFUSION
3.0000 mL/kg/h | INTRAVENOUS | Status: AC
  Administered 2019-05-30: 3 mL/kg/h via INTRAVENOUS

## 2019-05-30 MED ORDER — HEPARIN (PORCINE) IN NACL 1000-0.9 UT/500ML-% IV SOLN
INTRAVENOUS | Status: AC
  Filled 2019-05-30: qty 1000

## 2019-05-30 MED ORDER — HEPARIN (PORCINE) IN NACL 1000-0.9 UT/500ML-% IV SOLN
INTRAVENOUS | Status: DC | PRN
  Administered 2019-05-30 (×2): 500 mL

## 2019-05-30 MED ORDER — ASPIRIN 81 MG PO CHEW
CHEWABLE_TABLET | ORAL | Status: AC
  Administered 2019-05-30: 81 mg via ORAL
  Filled 2019-05-30: qty 1

## 2019-05-30 MED ORDER — HEPARIN SODIUM (PORCINE) 1000 UNIT/ML IJ SOLN
INTRAMUSCULAR | Status: DC | PRN
  Administered 2019-05-30: 3500 [IU] via INTRAVENOUS

## 2019-05-30 MED ORDER — SODIUM CHLORIDE 0.9 % WEIGHT BASED INFUSION: 1.0000 mL/kg/h | INTRAVENOUS | Status: DC

## 2019-05-30 MED ORDER — IOHEXOL 350 MG/ML SOLN
INTRAVENOUS | Status: DC | PRN
  Administered 2019-05-30: 60 mL via INTRA_ARTERIAL

## 2019-05-30 MED ORDER — LIDOCAINE HCL (PF) 1 % IJ SOLN
INTRAMUSCULAR | Status: DC | PRN
  Administered 2019-05-30 (×2): 2 mL

## 2019-05-30 MED ORDER — MIDAZOLAM HCL 2 MG/2ML IJ SOLN
INTRAMUSCULAR | Status: DC | PRN
  Administered 2019-05-30: 2 mg via INTRAVENOUS

## 2019-05-30 MED ORDER — MIDAZOLAM HCL 2 MG/2ML IJ SOLN
INTRAMUSCULAR | Status: AC
  Filled 2019-05-30: qty 2

## 2019-05-30 MED ORDER — LIDOCAINE HCL (PF) 1 % IJ SOLN
INTRAMUSCULAR | Status: AC
  Filled 2019-05-30: qty 30

## 2019-05-30 MED ORDER — VERAPAMIL HCL 2.5 MG/ML IV SOLN
INTRAVENOUS | Status: DC | PRN
  Administered 2019-05-30: 10 mL via INTRA_ARTERIAL

## 2019-05-30 MED ORDER — HEPARIN SODIUM (PORCINE) 1000 UNIT/ML IJ SOLN
INTRAMUSCULAR | Status: AC
  Filled 2019-05-30: qty 1

## 2019-05-30 MED ORDER — SODIUM CHLORIDE 0.9 % IV SOLN: 250.0000 mL | INTRAVENOUS | Status: DC | PRN

## 2019-05-30 MED ORDER — ASPIRIN 81 MG PO CHEW
81.0000 mg | CHEWABLE_TABLET | ORAL | Status: AC
  Administered 2019-05-30: 09:00:00 81 mg via ORAL

## 2019-05-30 MED ORDER — SODIUM CHLORIDE 0.9% FLUSH: 3.0000 mL | INTRAVENOUS | Status: DC | PRN

## 2019-05-30 MED ORDER — FENTANYL CITRATE (PF) 100 MCG/2ML IJ SOLN
INTRAMUSCULAR | Status: AC
  Filled 2019-05-30: qty 2

## 2019-05-30 MED ORDER — VERAPAMIL HCL 2.5 MG/ML IV SOLN
INTRAVENOUS | Status: AC
  Filled 2019-05-30: qty 2

## 2019-05-30 MED ORDER — FENTANYL CITRATE (PF) 100 MCG/2ML IJ SOLN
INTRAMUSCULAR | Status: DC | PRN
  Administered 2019-05-30: 25 ug via INTRAVENOUS

## 2019-05-30 SURGICAL SUPPLY — 11 items
CATH 5FR JL3.5 JR4 ANG PIG MP (CATHETERS) ×1 IMPLANT
CATH BALLN WEDGE 5F 110CM (CATHETERS) ×1 IMPLANT
DEVICE RAD TR BAND REGULAR (VASCULAR PRODUCTS) ×1 IMPLANT
GLIDESHEATH SLEND SS 6F .021 (SHEATH) ×1 IMPLANT
GUIDEWIRE INQWIRE 1.5J.035X260 (WIRE) IMPLANT
INQWIRE 1.5J .035X260CM (WIRE) ×2
KIT HEART LEFT (KITS) ×2 IMPLANT
PACK CARDIAC CATHETERIZATION (CUSTOM PROCEDURE TRAY) ×2 IMPLANT
SHEATH GLIDE SLENDER 4/5FR (SHEATH) ×1 IMPLANT
TRANSDUCER W/STOPCOCK (MISCELLANEOUS) ×2 IMPLANT
TUBING CIL FLEX 10 FLL-RA (TUBING) ×2 IMPLANT

## 2019-05-30 NOTE — Discharge Instructions (Signed)
Radial Site Care ° °This sheet gives you information about how to care for yourself after your procedure. Your health care provider may also give you more specific instructions. If you have problems or questions, contact your health care provider. °What can I expect after the procedure? °After the procedure, it is common to have: °· Bruising and tenderness at the catheter insertion area. °Follow these instructions at home: °Medicines °· Take over-the-counter and prescription medicines only as told by your health care provider. °Insertion site care °· Follow instructions from your health care provider about how to take care of your insertion site. Make sure you: °? Wash your hands with soap and water before you change your bandage (dressing). If soap and water are not available, use hand sanitizer. °? Change your dressing as told by your health care provider. °? Leave stitches (sutures), skin glue, or adhesive strips in place. These skin closures may need to stay in place for 2 weeks or longer. If adhesive strip edges start to loosen and curl up, you may trim the loose edges. Do not remove adhesive strips completely unless your health care provider tells you to do that. °· Check your insertion site every day for signs of infection. Check for: °? Redness, swelling, or pain. °? Fluid or blood. °? Pus or a bad smell. °? Warmth. °· Do not take baths, swim, or use a hot tub until your health care provider approves. °· You may shower 24-48 hours after the procedure, or as directed by your health care provider. °? Remove the dressing and gently wash the site with plain soap and water. °? Pat the area dry with a clean towel. °? Do not rub the site. That could cause bleeding. °· Do not apply powder or lotion to the site. °Activity ° °· For 24 hours after the procedure, or as directed by your health care provider: °? Do not flex or bend the affected arm. °? Do not push or pull heavy objects with the affected arm. °? Do not  drive yourself home from the hospital or clinic. You may drive 24 hours after the procedure unless your health care provider tells you not to. °? Do not operate machinery or power tools. °· Do not lift anything that is heavier than 10 lb (4.5 kg), or the limit that you are told, until your health care provider says that it is safe. °· Ask your health care provider when it is okay to: °? Return to work or school. °? Resume usual physical activities or sports. °? Resume sexual activity. °General instructions °· If the catheter site starts to bleed, raise your arm and put firm pressure on the site. If the bleeding does not stop, get help right away. This is a medical emergency. °· If you went home on the same day as your procedure, a responsible adult should be with you for the first 24 hours after you arrive home. °· Keep all follow-up visits as told by your health care provider. This is important. °Contact a health care provider if: °· You have a fever. °· You have redness, swelling, or yellow drainage around your insertion site. °Get help right away if: °· You have unusual pain at the radial site. °· The catheter insertion area swells very fast. °· The insertion area is bleeding, and the bleeding does not stop when you hold steady pressure on the area. °· Your arm or hand becomes pale, cool, tingly, or numb. °These symptoms may represent a serious problem   that is an emergency. Do not wait to see if the symptoms will go away. Get medical help right away. Call your local emergency services (911 in the [place] Do not drive yourself to the hospital. °Summary °· After the procedure, it is common to have bruising and tenderness at the site. °· Follow instructions from your health care provider about how to take care of your radial site wound. Check the wound every day for signs of infection. °· Do not lift anything that is heavier than 10 lb (4.5 kg), or the limit that you are told, until your health care provider says  that it is safe. °This information is not intended to replace advice given to you by your health care provider. Make sure you discuss any questions you have with your health care provider. °Document Released: 10/01/2010 Document Revised: 10/04/2017 Document Reviewed: 10/04/2017 °Elsevier Patient Education © 2020 [employer] ° °

## 2019-05-30 NOTE — Interval H&P Note (Signed)
Cath Lab Visit (complete for each Cath Lab visit)  Clinical Evaluation Leading to the Procedure:   ACS: No.  Non-ACS:    Anginal Classification: CCS III  Anti-ischemic medical therapy: Maximal Therapy (2 or more classes of medications)  Non-Invasive Test Results: Intermediate-risk stress test findings: cardiac mortality 1-3%/year  Prior CABG: No previous CABG      History and Physical Interval Note:  05/30/2019 10:27 AM  [name]  has presented today for surgery, with the diagnosis of unstable angina.  The various methods of treatment have been discussed with the patient and family. After consideration of risks, benefits and other options for treatment, the patient has consented to  Procedure(s): RIGHT/LEFT HEART CATH AND CORONARY ANGIOGRAPHY (N/A) as a surgical intervention.  The patient's history has been reviewed, patient examined, no change in status, stable for surgery.  I have reviewed the patient's chart and labs.  Questions were answered to the patient's satisfaction.     [name]

## 2019-05-31 LAB — POCT I-STAT EG7
Acid-base deficit: 1 mmol/L (ref 0.0–2.0)
Bicarbonate: 25.3 mmol/L (ref 20.0–28.0)
Calcium, Ion: 1.25 mmol/L (ref 1.15–1.40)
HCT: 35 % — ABNORMAL LOW (ref 36.0–46.0)
Hemoglobin: 11.9 g/dL — ABNORMAL LOW (ref 12.0–15.0)
O2 Saturation: 81 %
Potassium: 4 mmol/L (ref 3.5–5.1)
Sodium: 141 mmol/L (ref 135–145)
TCO2: 27 mmol/L (ref 22–32)
pCO2, Ven: 49 mmHg (ref 44.0–60.0)
pH, Ven: 7.32 (ref 7.250–7.430)
pO2, Ven: 49 mmHg — ABNORMAL HIGH (ref 32.0–45.0)

## 2019-05-31 LAB — POCT I-STAT 7, (LYTES, BLD GAS, ICA,H+H)
Acid-base deficit: 2 mmol/L (ref 0.0–2.0)
Bicarbonate: 23.3 mmol/L (ref 20.0–28.0)
Calcium, Ion: 1.19 mmol/L (ref 1.15–1.40)
HCT: 34 % — ABNORMAL LOW (ref 36.0–46.0)
Hemoglobin: 11.6 g/dL — ABNORMAL LOW (ref 12.0–15.0)
O2 Saturation: 100 %
Potassium: 3.9 mmol/L (ref 3.5–5.1)
Sodium: 141 mmol/L (ref 135–145)
TCO2: 25 mmol/L (ref 22–32)
pCO2 arterial: 40.8 mmHg (ref 32.0–48.0)
pH, Arterial: 7.365 (ref 7.350–7.450)
pO2, Arterial: 213 mmHg — ABNORMAL HIGH (ref 83.0–108.0)

## 2019-06-13 ENCOUNTER — Other Ambulatory Visit: Payer: Self-pay | Admitting: Cardiovascular Disease

## 2019-06-13 MED ORDER — METOPROLOL TARTRATE 25 MG PO TABS: 25.0000 mg | ORAL_TABLET | Freq: Two times a day (BID) | ORAL | 3 refills | Status: DC

## 2019-06-13 MED ORDER — ISOSORBIDE MONONITRATE ER 30 MG PO TB24: 30.0000 mg | ORAL_TABLET | Freq: Every day | ORAL | 3 refills | Status: DC

## 2019-06-17 ENCOUNTER — Telehealth: Payer: Self-pay | Admitting: Nurse Practitioner

## 2019-06-17 ENCOUNTER — Ambulatory Visit: Payer: Medicare Other | Admitting: Cardiovascular Disease

## 2019-06-17 DIAGNOSIS — I25118 Atherosclerotic heart disease of native coronary artery with other forms of angina pectoris: Secondary | ICD-10-CM

## 2019-06-17 DIAGNOSIS — R0609 Other forms of dyspnea: Secondary | ICD-10-CM

## 2019-06-17 NOTE — Telephone Encounter (Signed)
Received message from Dr. [name] requesting that we order PFT on patient who continues to have DOE. She had nonobstructive mild CAD on her coronary cath but still cannot walk any distance without stopping to catch her breath. I advised that she will receive a call to schedule the test.

## 2019-06-18 DIAGNOSIS — M1812 Unilateral primary osteoarthritis of first carpometacarpal joint, left hand: Secondary | ICD-10-CM | POA: Diagnosis not present

## 2019-07-01 ENCOUNTER — Telehealth: Payer: Self-pay | Admitting: Internal Medicine

## 2019-07-01 NOTE — Telephone Encounter (Signed)
Printed

## 2019-07-12 ENCOUNTER — Other Ambulatory Visit: Payer: Self-pay

## 2019-07-12 ENCOUNTER — Ambulatory Visit (INDEPENDENT_AMBULATORY_CARE_PROVIDER_SITE_OTHER): Payer: Medicare Other | Admitting: Cardiovascular Disease

## 2019-07-12 ENCOUNTER — Encounter: Payer: Self-pay | Admitting: Cardiovascular Disease

## 2019-07-12 VITALS — BP 132/68 | HR 60 | Ht 65.0 in | Wt 159.1 lb

## 2019-07-12 DIAGNOSIS — E785 Hyperlipidemia, unspecified: Secondary | ICD-10-CM

## 2019-07-12 DIAGNOSIS — I2510 Atherosclerotic heart disease of native coronary artery without angina pectoris: Secondary | ICD-10-CM

## 2019-07-12 DIAGNOSIS — R0600 Dyspnea, unspecified: Secondary | ICD-10-CM | POA: Diagnosis not present

## 2019-07-12 DIAGNOSIS — I200 Unstable angina: Secondary | ICD-10-CM | POA: Diagnosis not present

## 2019-07-12 DIAGNOSIS — R0609 Other forms of dyspnea: Secondary | ICD-10-CM

## 2019-07-12 LAB — LIPID PANEL
Chol/HDL Ratio: 2.4 ratio (ref 0.0–4.4)
Cholesterol, Total: 109 mg/dL (ref 100–199)
HDL: 46 mg/dL (ref >39)
LDL Chol Calc (NIH): 38 mg/dL (ref 0–99)
Triglycerides: 148 mg/dL (ref 0–149)
VLDL Cholesterol Cal: 25 mg/dL (ref 5–40)

## 2019-07-12 LAB — HEPATIC FUNCTION PANEL
ALT: 17 IU/L (ref 0–32)
AST: 16 IU/L (ref 0–40)
Albumin: 4.3 g/dL (ref 3.7–4.7)
Alkaline Phosphatase: 89 IU/L (ref 39–117)
Bilirubin Total: 0.6 mg/dL (ref 0.0–1.2)
Bilirubin, Direct: 0.22 mg/dL (ref 0.00–0.40)
Total Protein: 6.3 g/dL (ref 6.0–8.5)

## 2019-07-12 NOTE — Progress Notes (Signed)
Cardiology Office Note:    Date:  07/12/2019   ID:  [name], DOB 04/15/1941, MRN [id number]  PCP:  [name], MD  Cardiologist:  [name], MD    Referring MD: [name], MD   Chief Complaint  Patient presents with  . Shortness of Breath   Problem list 1. Shortness of breath with exertion - Asthma  2. Hyperlipidemia 3. Hypertension 4. CVA - on Plavix       [name] is a 78 y.o. female with a hx of DOE Hx of TIA years ago, was started on Plavix at that time Has no energy.   Gets short of breath walking to the mailbox and back  ( 100 yards)  No CP or tightness.    No syncope.    Has had a chronic cough / throat clearing . Had blood work at primary MD Alroy Dust)   Has significant vertebral Luevenia Maxin arterial disease. Dr. Erlinda Hong has given her a minimum  BP goal of 120-150  Dr. Mickle Plumb records from [place] reviewed  Sept. 5, 2019:  Seen for DOE and generalized fatigue Went to [facility] pulmonary and was told she had asthma Takes her inhalers.  Went to her primary care for her fatigue - was told to decrease her carbs   Denies any chest pain  Has headaches and has some dizziness ( history of a TIA in the past )  Does not get any regular exercise   Aug. 25, 2020   Has had worsening DOE recently  Similar to last year  Having severe DOE  Has seen pulmonary md in [place] of chest tightness.  I hear no wheezing on exam today   Oct. 30, 2020   [place] is seen today for follow-up of her severe shortness of breath with exertion.  We performed a right and left heart catheterization on her on March 29, 2019. The mid LAD had a 30% stenosis.  Fed normal left ventricular end-diastolic pressure with an EDP of 6 mmHg.  The left ventricular systolic function was normal with an ejection fraction of 55 to 65%. Right heart pressures were unremarkable.  Mean PA pressure was 40 mmHg.  Pulmonary capillary wedge pressure was 5.  Cardiac output  was 7.3 L/min.  Feeling some better.   Still does not have the breathing capacity .  Has been walking some.   Seems to feel better on the Imdur    Past Medical History:  Diagnosis Date  . Asthma   . Breast CA ([place])   . Cancer [facility])    right breast mastectomy  . DDD (degenerative disc disease), cervical   . H/O chest pain 03/24/2006   normal nuclear  study  . Headache   . High cholesterol   . Hypertension   . Mild aortic insufficiency 03/28/2006   echo  . Normal cardiac stress test 2005  . Personal history of chemotherapy   . Personal history of radiation therapy   . PONV (postoperative nausea and vomiting)   . Sinus bradycardia on ECG   . Stroke [facility])     Past Surgical History:  Procedure Laterality Date  . APPENDECTOMY     "30 years ago"  . BREAST SURGERY    . childbirth     x 2  . CHOLECYSTECTOMY     "15 years ago"  . ESOPHAGOGASTRODUODENOSCOPY N/A 03/04/2014   Procedure: ESOPHAGOGASTRODUODENOSCOPY (EGD);  Surgeon: [name], MD;  Location: [place];  Service: Endoscopy;  Laterality: N/A;  . MASTECTOMY  1993   right breast  . OTHER SURGICAL HISTORY     hysterectomy  . rectal fissure    . RIGHT/LEFT HEART CATH AND CORONARY ANGIOGRAPHY N/A 05/30/2019   Procedure: RIGHT/LEFT HEART CATH AND CORONARY ANGIOGRAPHY;  Surgeon: [name], MD;  Location: [place] [facility];  Service: Cardiovascular;  Laterality: N/A;  . TUBAL LIGATION      Current Medications: Current Meds  Medication Sig  . albuterol (VENTOLIN HFA) 108 (90 Base) MCG/ACT inhaler Inhale 1-2 puffs into the lungs every 4 (four) hours as needed for wheezing or shortness of breath.  Marland Kitchen amLODipine (NORVASC) 5 MG tablet Take 5 mg by mouth daily.  Marland Kitchen atorvastatin (LIPITOR) 80 MG tablet Take 1 tablet (80 mg total) by mouth daily at 6 PM.  . clopidogrel (PLAVIX) 75 MG tablet Take 1 tablet (75 mg total) by mouth daily.  . Fluticasone-Salmeterol (ADVAIR) 250-50 MCG/DOSE AEPB Inhale 1 puff into the  lungs 2 (two) times daily.  . isosorbide mononitrate (IMDUR) 30 MG 24 hr tablet Take 1 tablet (30 mg total) by mouth daily.  Marland Kitchen loratadine (CLARITIN) 10 MG tablet Take 10 mg by mouth daily as needed for allergies or rhinitis.   . metoprolol tartrate (LOPRESSOR) 25 MG tablet Take 1 tablet (25 mg total) by mouth 2 (two) times daily.  . montelukast (SINGULAIR) 10 MG tablet Take 10 mg by mouth at bedtime.  . nitroGLYCERIN (NITROSTAT) 0.4 MG SL tablet Place 1 tablet (0.4 mg total) under the tongue every 5 (five) minutes as needed for chest pain.  . nortriptyline (PAMELOR) 10 MG capsule Take 2 capsules (20 mg total) by mouth at bedtime.  . pantoprazole (PROTONIX) 40 MG tablet Take 40 mg by mouth daily as needed (Acid Reflux).      Allergies:   Doxycycline and Percocet [oxycodone-acetaminophen]   Social History   Socioeconomic History  . Marital status: Married    Spouse name: [name]   . Number of children: 4  . Years of education: 12+  . Highest education level: Not on file  Occupational History  . Not on file  Social Needs  . Financial resource strain: Not on file  . Food insecurity    Worry: Not on file    Inability: Not on file  . Transportation needs    Medical: Not on file    Non-medical: Not on file  Tobacco Use  . Smoking status: Never Smoker  . Smokeless tobacco: Never Used  Substance and Sexual Activity  . Alcohol use: No    Alcohol/week: 0.0 standard drinks  . Drug use: No  . Sexual activity: Not Currently  Lifestyle  . Physical activity    Days per week: Not on file    Minutes per session: Not on file  . Stress: Not on file  Relationships  . Social Herbalist on phone: Not on file    Gets together: Not on file    Attends religious service: Not on file    Active member of club or organization: Not on file    Attends meetings of clubs or organizations: Not on file    Relationship status: Not on file  Other Topics Concern  . Not on file  Social History  Narrative   Right handed, caffeine 2 cups daily, married, 4 kids, Retired.  [employer] Grad.      Family History: The patient's family history includes Cancer (age of onset: 13)  in her sister; Heart attack in her father, mother, sister, and sister; Heart disease in her father and mother; Hypertension in her mother, sister, and sister. ROS:   Please see the history of present illness.     All other systems reviewed and are negative.  EKGs/Labs/Other Studies Reviewed:    The following studies were reviewed today:   EKG:    May 07, 2019: Normal sinus rhythm at 88.  Occasional premature atrial contractions.  Old septal MI    Recent Labs: 05/27/2019: BUN 12; Creatinine, Ser 0.85; Platelets 237 05/30/2019: Hemoglobin 11.6; Potassium 3.9; Sodium 141  Recent Lipid Panel    Component Value Date/Time   CHOL 107 03/23/2016 0900   TRIG 91 03/23/2016 0900   HDL 49 03/23/2016 0900   CHOLHDL 2.2 03/23/2016 0900   CHOLHDL 6.1 04/28/2015 0645   VLDL 60 (H) 04/28/2015 0645   LDLCALC 40 03/23/2016 0900    Physical Exam: Blood pressure 132/68, pulse 60, height 5\' 5"  (1.651 m), weight 159 lb 1.9 oz (72.2 kg), SpO2 97 %.  GEN:  Well nourished, well developed in no acute distress HEENT: Normal NECK: No JVD; No carotid bruits LYMPHATICS: No lymphadenopathy CARDIAC: RRR , no murmurs, rubs, gallops RESPIRATORY:  Clear to auscultation without rales, wheezing or rhonchi  ABDOMEN: Soft, non-tender, non-distended MUSCULOSKELETAL:  No edema; No deformity  SKIN: Warm and dry NEUROLOGIC:  Alert and oriented x 3    ASSESSMENT:    1. Coronary artery disease involving native coronary artery of native heart without angina pectoris   2. Hyperlipidemia, unspecified hyperlipidemia type   3. DOE (dyspnea on exertion)    PLAN:    1. Shortness of breath with exertion: Heart catheterization revealed only minimal plaque in her LAD.  Her filling pressures were normal.  The cath was basically unremarkable. She  is scheduled to have pulmonary function test at this would not be until December.  I have advised her to call me or her primary medical doctor if her breathing acutely worsens.  2. Hypertension:    Blood pressure looks great.  Continue current medications.  3. Hyperlipidemia: She is on atorvastatin.  She is not had lipids or liver enzymes checked in several years.  We will recheck today.  In order of problems listed above: Medication Adjustments/Labs and Tests Ordered: Current medicines are reviewed at length with the patient today.  Concerns regarding medicines are outlined above.  Orders Placed This Encounter  Procedures  . Lipid Profile  . Hepatic function panel   No orders of the defined types were placed in this encounter.    Signed, [name], MD  07/12/2019 9:37 AM    [facility]

## 2019-07-12 NOTE — Patient Instructions (Signed)
Medication Instructions:  Your physician recommends that you continue on your current medications as directed. Please refer to the Current Medication list given to you today.  *If you need a refill on your cardiac medications before your next appointment, please call your pharmacy*  Lab Work: TODAY - cholesterol, liver panel  If you have labs (blood work) drawn today and your tests are completely normal, you will receive your results only by: Marland Kitchen MyChart Message (if you have MyChart) OR . A paper copy in the mail If you have any lab test that is abnormal or we need to change your treatment, we will call you to review the results.   Testing/Procedures: Keep your appointment for your PFT    Follow-Up: At [facility], you and your health needs are our priority.  As part of our continuing mission to provide you with exceptional heart care, we have created designated Provider Care Teams.  These Care Teams include your primary Cardiologist (physician) and Advanced Practice Providers (APPs -  Physician Assistants and Nurse Practitioners) who all work together to provide you with the care you need, when you need it.  Your next appointment:   6 months  The format for your next appointment:   Either In Person or Virtual  Provider:   You may see [name], MD or one of the following Advanced Practice Providers on your designated Care Team:    Richardson Dopp, PA-C  [place], [place]  Daune Perch, [place]

## 2019-07-25 DIAGNOSIS — Z23 Encounter for immunization: Secondary | ICD-10-CM | POA: Diagnosis not present

## 2019-08-12 ENCOUNTER — Ambulatory Visit: Payer: Medicare Other | Admitting: Cardiovascular Disease

## 2019-08-19 ENCOUNTER — Telehealth: Payer: Self-pay | Admitting: Neurology

## 2019-08-19 NOTE — Telephone Encounter (Signed)
Patient called to schedule an appointment for her sharp side headaches. Patient states she has stroke concerns and would like for someone to FU. Please follow up .

## 2019-08-19 NOTE — Telephone Encounter (Signed)
I called the patient, left a message, I will call back later.   I called the patient again.  3 nights ago when she was at the beach she began having sharp jabbing pains lasting only a second or 2 on the left side just behind her left ear.  The episodes would last only a second or so, go away, and then recur.  Since that time, the episodes have become much less frequent and now are only occasional.  No other associated symptoms are noted.  The patient denies any significant neck pain.  The episodes above sound most consistent with an irritation of the lesser branch of the occipital nerve with a form of occipital neuralgia.  If the episodes get worse, would consider treatment with gabapentin, this does not appear to be a typical pain for an arterial dissection or occlusion.

## 2019-08-19 NOTE — Telephone Encounter (Signed)
I called pt about having sharp on left side of her headache which radiates to her neck. Pt stated she has been taking tylenol every 4 to 5 hours and it goes away after taking the medication. Per notes pt has had this pain before since 2018. PT is not having any blurred vision, dizzy spells, or other neurological issues. Pt is able to function and do daily ADL. I ask pt did she stop taking the topamax that was prescribed yesterday. Pt stated she has not taken the medication in a year. I stated message will be sent to work in [place]. I advise pt if her headache pain gets worse and she cannot function to seek the nearest ED. Pt is afraid because she had a stroke in the past.

## 2019-08-24 ENCOUNTER — Other Ambulatory Visit (HOSPITAL_COMMUNITY)
Admission: RE | Admit: 2019-08-24 | Discharge: 2019-08-24 | Disposition: A | Discharge status: Home / Self Care | Payer: Medicare Other | Source: Ambulatory Visit | Attending: Internal Medicine | Admitting: Internal Medicine

## 2019-08-24 DIAGNOSIS — Z20828 Contact with and (suspected) exposure to other viral communicable diseases: Secondary | ICD-10-CM | POA: Insufficient documentation

## 2019-08-24 DIAGNOSIS — Z01812 Encounter for preprocedural laboratory examination: Secondary | ICD-10-CM | POA: Diagnosis present

## 2019-08-24 LAB — SARS CORONAVIRUS 2 (TAT 6-24 HRS): SARS Coronavirus 2: NEGATIVE

## 2019-08-27 ENCOUNTER — Ambulatory Visit (INDEPENDENT_AMBULATORY_CARE_PROVIDER_SITE_OTHER): Payer: Medicare Other | Admitting: Internal Medicine

## 2019-08-27 ENCOUNTER — Other Ambulatory Visit: Payer: Self-pay

## 2019-08-27 DIAGNOSIS — R0600 Dyspnea, unspecified: Secondary | ICD-10-CM | POA: Diagnosis not present

## 2019-08-27 DIAGNOSIS — R0609 Other forms of dyspnea: Secondary | ICD-10-CM

## 2019-08-27 DIAGNOSIS — I25118 Atherosclerotic heart disease of native coronary artery with other forms of angina pectoris: Secondary | ICD-10-CM

## 2019-08-27 LAB — PULMONARY FUNCTION TEST
DL/VA % pred: 105 %
DL/VA: 4.28 ml/min/mmHg/L
DLCO unc % pred: 72 %
DLCO unc: 14.13 ml/min/mmHg
FEF 25-75 Post: 2.28 L/sec
FEF 25-75 Pre: 1.87 L/sec
FEF2575-%Change-Post: 21 %
FEF2575-%Pred-Post: 150 %
FEF2575-%Pred-Pre: 123 %
FEV1-%Change-Post: 3 %
FEV1-%Pred-Post: 81 %
FEV1-%Pred-Pre: 78 %
FEV1-Post: 1.69 L
FEV1-Pre: 1.63 L
FEV1FVC-%Change-Post: 5 %
FEV1FVC-%Pred-Pre: 110 %
FEV6-%Change-Post: -1 %
FEV6-%Pred-Post: 73 %
FEV6-%Pred-Pre: 75 %
FEV6-Post: 1.95 L
FEV6-Pre: 1.98 L
FEV6FVC-%Change-Post: 0 %
FEV6FVC-%Pred-Post: 105 %
FEV6FVC-%Pred-Pre: 104 %
FVC-%Change-Post: -2 %
FVC-%Pred-Post: 70 %
FVC-%Pred-Pre: 71 %
FVC-Post: 1.95 L
FVC-Pre: 1.99 L
Post FEV1/FVC ratio: 87 %
Post FEV6/FVC ratio: 100 %
Pre FEV1/FVC ratio: 82 %
Pre FEV6/FVC Ratio: 99 %
RV % pred: 97 %
RV: 2.37 L
TLC % pred: 81 %
TLC: 4.22 L

## 2019-08-27 NOTE — Progress Notes (Signed)
Full PFT performed today. °

## 2019-08-30 ENCOUNTER — Telehealth: Payer: Self-pay | Admitting: Nurse Practitioner

## 2019-08-30 DIAGNOSIS — R942 Abnormal results of pulmonary function studies: Secondary | ICD-10-CM

## 2019-08-30 DIAGNOSIS — R058 Other specified cough: Secondary | ICD-10-CM

## 2019-08-30 DIAGNOSIS — R0609 Other forms of dyspnea: Secondary | ICD-10-CM

## 2019-08-30 DIAGNOSIS — R05 Cough: Secondary | ICD-10-CM

## 2019-08-30 NOTE — Telephone Encounter (Signed)
Results and plan of care reviewed with patient. Patient requests referral to pulmonology. I advised her that someone from their office will call her to schedule. She thanked me for the call.

## 2019-08-30 NOTE — Telephone Encounter (Signed)
-----   Message from Thayer Headings, MD sent at 08/30/2019  5:27 PM EST ----- She has mildly reduced DLCO-diffusion capacity.  I discussed with Dr. Annamaria Boots.  I think we should send her for pulmonary consultation given her persistent shortness of breath and mildly abnormal PFTs.  Have not found any cardiac etiology for shortness of breath.

## 2019-09-16 ENCOUNTER — Ambulatory Visit (INDEPENDENT_AMBULATORY_CARE_PROVIDER_SITE_OTHER): Payer: Medicare Other | Admitting: Internal Medicine

## 2019-09-16 ENCOUNTER — Other Ambulatory Visit: Payer: Self-pay

## 2019-09-16 ENCOUNTER — Encounter: Payer: Self-pay | Admitting: Internal Medicine

## 2019-09-16 VITALS — BP 124/80 | HR 84 | Temp 97.5°F | Ht 65.0 in | Wt 160.4 lb

## 2019-09-16 DIAGNOSIS — R0982 Postnasal drip: Secondary | ICD-10-CM

## 2019-09-16 MED ORDER — MONTELUKAST SODIUM 10 MG PO TABS: 10.0000 mg | ORAL_TABLET | Freq: Every day | ORAL | 3 refills | Status: DC

## 2019-09-16 MED ORDER — FLUTICASONE PROPIONATE 50 MCG/ACT NA SUSP: 1.0000 | Freq: Every day | NASAL | 2 refills | Status: DC

## 2019-09-16 NOTE — Patient Instructions (Addendum)
The patient should have follow up scheduled with myself in 3 months.    Keep taking singulair  Flonase - 1 spray on each side of your nose twice a day for first week, then 1 spray on each side.   Instructions for use:  If you also use a saline nasal spray or rinse, use that first.  Position the head with the chin slightly tucked. Use the right hand to spray into the left nostril and the right hand to spray into the left nostril.   Point the bottle away from the septum of your nose (cartilage that divides the two sides of your nose).   Hold the nostril closed on the opposite side from where you will spray  Spray once and gently sniff to pull the medicine into the higher parts of your nose.  Don't sniff too hard as the medicine will drain down the back of your throat instead.  Repeat with a second spray on the same side if prescribed.  Repeat on the other side of your nose.

## 2019-09-16 NOTE — Progress Notes (Signed)
[name]    [id number]    06/10/1941  Primary Care Physician:Mitchell, L.Marlou Sa, MD Date of Appointment: 09/16/2019 New Patient Evaluation, Self Referred  Chief complaint:   Chief Complaint  Patient presents with  . Pulmonary Consult    Referred for SOB and cough. Patient reports that she has sob with exertion and a dry cough that she has had for years that she can't get rid of.      HPI:  Cough and Shortness of breath  Cough has been going on for 2 years has frequent throat clearing. Has not tried any medications for this was given prednisone by PCP at some point, not sure if it helped. Mostly dry but has scant yellow mucus. She coughs all day and it also comes up at night - it does wake her up at night. Feels its worse during the day. Not worse after eating.   She was given an albuterol by her PCP Dr. Alroy Dust. Has been on that about a year and a half - uses it less than once/week.  She feels immediate relief from that.    Doesn't take claritin although it is on her medication list. She has both albuterol and advair on her medication list but no longer takes advair. She thinks the advair helped but never made it go away completely, but she was just taking it as needed.  She also takes singulair - unsure why she is on that .   Takes protonix once a day - has been on that for a couple of years. Unsure if this is helping her cough.   She had childhood asthma.  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  Smoking history: never smoker  Social History   Occupational History  . Not on file  Tobacco Use  . Smoking status: Never Smoker  . Smokeless tobacco: Never Used  Substance and Sexual Activity  . Alcohol use: No    Alcohol/week: 0.0 standard drinks  . Drug use: No  . Sexual activity: Not Currently    Relevant family history:  Family History  Problem Relation Age of  Onset  . Heart attack Mother   . Heart disease Mother   . Hypertension Mother   . Heart attack Father   . Heart disease Father   . Hypertension Sister   . Heart attack Sister   . Cancer Sister 31       colon  . Hypertension Sister   . Heart attack Sister     Past Medical History:  Diagnosis Date  . Asthma   . Breast CA ([place])   . Cancer [facility])    right breast mastectomy  . DDD (degenerative disc disease), cervical   . H/O chest pain 03/24/2006   normal nuclear  study  . Headache   . High cholesterol   . Hypertension   . Mild aortic insufficiency 03/28/2006   echo  . Normal cardiac stress test 2005  . Personal history of chemotherapy   . Personal history of radiation therapy   . PONV (postoperative nausea and vomiting)   . Sinus bradycardia on ECG   . Stroke [facility])     Past Surgical History:  Procedure Laterality Date  . APPENDECTOMY     "30 years ago"  . BREAST SURGERY    . childbirth     x 2  . CHOLECYSTECTOMY     "  15 years ago"  . ESOPHAGOGASTRODUODENOSCOPY N/A 03/04/2014   Procedure: ESOPHAGOGASTRODUODENOSCOPY (EGD);  Surgeon: [name], MD;  Location: [facility] ENDOSCOPY;  Service: Endoscopy;  Laterality: N/A;  . MASTECTOMY  1993   right breast  . OTHER SURGICAL HISTORY     hysterectomy  . rectal fissure    . RIGHT/LEFT HEART CATH AND CORONARY ANGIOGRAPHY N/A 05/30/2019   Procedure: RIGHT/LEFT HEART CATH AND CORONARY ANGIOGRAPHY;  Surgeon: [name], MD;  Location: [place] CV LAB;  Service: Cardiovascular;  Laterality: N/A;  . TUBAL LIGATION       Review of systems: Review of Systems  Constitutional: Negative for chills, fever and weight loss.  HENT: Positive for congestion. Negative for sinus pain and sore throat.        Watery eyes   Eyes: Negative for discharge and redness.  Respiratory: Positive for cough and wheezing. Negative for hemoptysis, sputum production and shortness of breath.   Cardiovascular: Negative for chest pain,  palpitations and leg swelling.  Gastrointestinal: Negative for heartburn, nausea and vomiting.  Musculoskeletal: Negative for joint pain and myalgias.  Skin: Negative for rash.  Neurological: Negative for dizziness, tremors, focal weakness and headaches.  Endo/Heme/Allergies: Positive for environmental allergies.  Psychiatric/Behavioral: Negative for depression. The patient is not nervous/anxious.   All other systems reviewed and are negative.   Physical Exam: Blood pressure 124/80, pulse 84, temperature (!) 97.5 F (36.4 C), temperature source Temporal, height 5\' 5"  (1.651 m), weight 160 lb 6.4 oz (72.8 kg), SpO2 98 %. Gen:      No acute distress Eyes: EOMI, sclera anicteric ENT:  no nasal polyps, mucus membranes moist. +cobblestoning in the oropharynx Neck:     Supple, no thyromegaly Lungs:    No increased respiratory effort, symmetric chest wall excursion, clear to auscultation bilaterally, no wheezes or crackles CV:         Regular rate and rhythm; no murmurs, rubs, or gallops.  No pedal edema Abd:      + bowel sounds; soft, non-tender; no distension MSK: no acute synovitis of DIP or PIP joints, no mechanics hands.  Skin:      Warm and dry; no rashes Neuro: normal speech, no focal facial asymmetry Psych: alert and oriented x3, normal mood and affect  Data Reviewed: Imaging: I have personally reviewed the spirometry from 08/27/2019 - spirometry is without airflow limitation  PFTs:  PFT Results Latest Ref Rng & Units 08/27/2019  FVC-Pre L 1.99  FVC-Predicted Pre % 71  FVC-Post L 1.95  FVC-Predicted Post % 70  Pre FEV1/FVC % % 82  Post FEV1/FCV % % 87  FEV1-Pre L 1.63  FEV1-Predicted Pre % 78  FEV1-Post L 1.69  DLCO UNC% % 72  DLCO COR %Predicted % 105  TLC L 4.22  TLC % Predicted % 81  RV % Predicted % 97      Assessment:  Chronic Cough Upper Airway Cough Syndrome  Plan/Recommendations: Will add intranasal steroid with flonase today Continue montelukast.    May need to add anti-histamine.  Discussed the systematic nature of addressing chronic cough. My approach is to treat one condition at a time instead of using multiple medications to treat multiple conditions. She would like to come off some of the medications she is on but is unfortunately not very knowledgeable of her medications and how long she has been on them.    I spent 48 minutes on 09/16/2019 in care of this patient including face to face time and non-face  to face time spent charting, review of outside records, and coordination of care.   Lenice Llamas, MD Pulmonary and [place]

## 2019-10-03 DIAGNOSIS — M1812 Unilateral primary osteoarthritis of first carpometacarpal joint, left hand: Secondary | ICD-10-CM | POA: Diagnosis not present

## 2019-10-07 ENCOUNTER — Ambulatory Visit: Payer: Medicare Other | Admitting: Neurology

## 2019-10-24 ENCOUNTER — Ambulatory Visit: Payer: Medicare Other | Admitting: Neurology

## 2019-10-30 ENCOUNTER — Other Ambulatory Visit: Payer: Self-pay | Admitting: Family Medicine

## 2019-10-30 DIAGNOSIS — Z1231 Encounter for screening mammogram for malignant neoplasm of breast: Secondary | ICD-10-CM

## 2019-12-04 ENCOUNTER — Other Ambulatory Visit: Payer: Self-pay

## 2019-12-04 ENCOUNTER — Ambulatory Visit
Admission: RE | Admit: 2019-12-04 | Discharge: 2019-12-04 | Disposition: A | Discharge status: Home / Self Care | Payer: Medicare Other | Source: Ambulatory Visit | Attending: Family Medicine | Admitting: Family Medicine

## 2019-12-04 DIAGNOSIS — Z1231 Encounter for screening mammogram for malignant neoplasm of breast: Secondary | ICD-10-CM

## 2019-12-16 DIAGNOSIS — M180 Bilateral primary osteoarthritis of first carpometacarpal joints: Secondary | ICD-10-CM | POA: Diagnosis not present

## 2020-01-23 ENCOUNTER — Encounter: Payer: Self-pay | Admitting: Cardiovascular Disease

## 2020-01-23 NOTE — Progress Notes (Signed)
No show

## 2020-01-24 ENCOUNTER — Ambulatory Visit (INDEPENDENT_AMBULATORY_CARE_PROVIDER_SITE_OTHER): Payer: Medicare Other | Admitting: Cardiovascular Disease

## 2020-01-24 DIAGNOSIS — Z5329 Procedure and treatment not carried out because of patient's decision for other reasons: Secondary | ICD-10-CM

## 2020-01-27 DIAGNOSIS — N3000 Acute cystitis without hematuria: Secondary | ICD-10-CM | POA: Diagnosis not present

## 2020-01-27 DIAGNOSIS — R300 Dysuria: Secondary | ICD-10-CM | POA: Diagnosis not present

## 2020-01-28 DIAGNOSIS — Z91199 Patient's noncompliance with other medical treatment and regimen due to unspecified reason: Secondary | ICD-10-CM | POA: Insufficient documentation

## 2020-02-12 ENCOUNTER — Ambulatory Visit (INDEPENDENT_AMBULATORY_CARE_PROVIDER_SITE_OTHER): Payer: Medicare Other | Admitting: Cardiovascular Disease

## 2020-02-12 ENCOUNTER — Other Ambulatory Visit: Payer: Self-pay

## 2020-02-12 ENCOUNTER — Encounter: Payer: Self-pay | Admitting: Cardiovascular Disease

## 2020-02-12 VITALS — BP 114/62 | HR 66 | Ht 64.0 in | Wt 160.0 lb

## 2020-02-12 DIAGNOSIS — I351 Nonrheumatic aortic (valve) insufficiency: Secondary | ICD-10-CM | POA: Diagnosis not present

## 2020-02-12 DIAGNOSIS — I10 Essential (primary) hypertension: Secondary | ICD-10-CM | POA: Diagnosis not present

## 2020-02-12 NOTE — Progress Notes (Signed)
Cardiology Office Note:    Date:  02/12/2020   ID:  [name], DOB 02/17/1941, MRN [id number]  PCP:  [name], MD  Cardiologist:  [name], MD    Referring MD: [name], MD   Chief Complaint  Patient presents with  . Coronary Artery Disease  . Hypertension   Problem list 1. Shortness of breath with exertion - Asthma  2. Hyperlipidemia 3. Hypertension 4. CVA - on Plavix       [name] is a 79 y.o. female with a hx of DOE Hx of TIA years ago, was started on Plavix at that time Has no energy.   Gets short of breath walking to the mailbox and back  ( 100 yards)  No CP or tightness.    No syncope.    Has had a chronic cough / throat clearing . Had blood work at primary MD Alroy Dust)   Has significant vertebral Luevenia Maxin arterial disease. Dr. Erlinda Hong has given her a minimum  BP goal of 120-150  Dr. Mickle Plumb records from [place] reviewed  Sept. 5, 2019:  Seen for DOE and generalized fatigue [facility] and was told she had asthma Takes her inhalers.  Went to her primary care for her fatigue - was told to decrease her carbs   Denies any chest pain  Has headaches and has some dizziness ( history of a TIA in the past )  Does not get any regular exercise   Aug. 25, 2020   Has had worsening DOE recently  Similar to last year  Having severe DOE  Has seen pulmonary md in [place] of chest tightness.  I hear no wheezing on exam today   Oct. 30, 2020   [name] is seen today for follow-up of her severe shortness of breath with exertion.  We performed a right and left heart catheterization on her on March 29, 2019. The mid LAD had a 30% stenosis.  Fed normal left ventricular end-diastolic pressure with an EDP of 6 mmHg.  The left ventricular systolic function was normal with an ejection fraction of 55 to 65%. Right heart pressures were unremarkable.  Mean PA pressure was 40 mmHg.  Pulmonary capillary wedge pressure  was 5.  Cardiac output was 7.3 L/min.  Feeling some better.   Still does not have the breathing capacity .  Has been walking some.   Seems to feel better on the Imdur   February 12, 2020: [name] is seen today for follow-up of her shortness of breath.  We performed a heart catheterization in July, 2020.   Their son is still in the hospital following surgery on his spine.   Lots of complications / infection Stressed but is otherwise doing well from a cardiac standpoint   Past Medical History:  Diagnosis Date  . Asthma   . Breast CA ([place])   . Cancer [facility])    right breast mastectomy  . DDD (degenerative disc disease), cervical   . H/O chest pain 03/24/2006   normal nuclear  study  . Headache   . High cholesterol   . Hypertension   . Mild aortic insufficiency 03/28/2006   echo  . Normal cardiac stress test 2005  . Personal history of chemotherapy   . Personal history of radiation therapy   . PONV (postoperative nausea and vomiting)   . Sinus bradycardia on ECG   . Stroke [facility])     Past Surgical History:  Procedure Laterality Date  . APPENDECTOMY     "30 years ago"  . BREAST SURGERY    . childbirth     x 2  . CHOLECYSTECTOMY     "15 years ago"  . ESOPHAGOGASTRODUODENOSCOPY N/A 03/04/2014   Procedure: ESOPHAGOGASTRODUODENOSCOPY (EGD);  Surgeon: [name], MD;  Location[facility] ENDOSCOPY;  Service: Endoscopy;  Laterality: N/A;  . MASTECTOMY  1993   right breast  . OTHER SURGICAL HISTORY     hysterectomy  . rectal fissure    . RIGHT/LEFT HEART CATH AND CORONARY ANGIOGRAPHY N/A 05/30/2019   Procedure: RIGHT/LEFT HEART CATH AND CORONARY ANGIOGRAPHY;  Surgeon: [name], MD;  Location: [place] CV LAB;  Service: Cardiovascular;  Laterality: N/A;  . TUBAL LIGATION      Current Medications: Current Meds  Medication Sig  . albuterol (VENTOLIN HFA) 108 (90 Base) MCG/ACT inhaler Inhale 1-2 puffs into the lungs every 4 (four) hours as needed for wheezing or shortness of  breath.  Marland Kitchen amLODipine (NORVASC) 5 MG tablet Take 5 mg by mouth daily.  Marland Kitchen atorvastatin (LIPITOR) 80 MG tablet Take 1 tablet (80 mg total) by mouth daily at 6 PM.  . clopidogrel (PLAVIX) 75 MG tablet Take 1 tablet (75 mg total) by mouth daily.  . fluticasone (FLONASE) 50 MCG/ACT nasal spray Place 1 spray into both nostrils daily.  . Fluticasone-Salmeterol (ADVAIR) 250-50 MCG/DOSE AEPB Inhale 1 puff into the lungs 2 (two) times daily.  . isosorbide mononitrate (IMDUR) 30 MG 24 hr tablet Take 1 tablet (30 mg total) by mouth daily.  Marland Kitchen loratadine (CLARITIN) 10 MG tablet Take 10 mg by mouth daily as needed for allergies or rhinitis.   . metoprolol tartrate (LOPRESSOR) 25 MG tablet Take 1 tablet (25 mg total) by mouth 2 (two) times daily.  . montelukast (SINGULAIR) 10 MG tablet Take 1 tablet (10 mg total) by mouth at bedtime.  . nitroGLYCERIN (NITROSTAT) 0.4 MG SL tablet Place 1 tablet (0.4 mg total) under the tongue every 5 (five) minutes as needed for chest pain.  . nortriptyline (PAMELOR) 10 MG capsule Take 2 capsules (20 mg total) by mouth at bedtime.  . pantoprazole (PROTONIX) 40 MG tablet Take 40 mg by mouth daily as needed (Acid Reflux).      Allergies:   Doxycycline and Percocet [oxycodone-acetaminophen]   Social History   Socioeconomic History  . Marital status: Married    Spouse name: [name]   . Number of children: 4  . Years of education: 12+  . Highest education level: Not on file  Occupational History  . Not on file  Tobacco Use  . Smoking status: Never Smoker  . Smokeless tobacco: Never Used  Substance and Sexual Activity  . Alcohol use: No    Alcohol/week: 0.0 standard drinks  . Drug use: No  . Sexual activity: Not Currently  Other Topics Concern  . Not on file  Social History Narrative   Right handed, caffeine 2 cups daily, married, 4 kids, Retired.  [employer].    Social Determinants of Health   Financial Resource Strain:   . Difficulty of Paying Living Expenses:     Food Insecurity:   . Worried About Charity fundraiser in the Last Year:   . Arboriculturist in the Last Year:   Transportation Needs:   . Film/video editor (Medical):   Marland Kitchen Lack of Transportation (Non-Medical):   Physical Activity:   . Days of Exercise per Week:   .  Minutes of Exercise per Session:   Stress:   . Feeling of Stress :   Social Connections:   . Frequency of Communication with Friends and Family:   . Frequency of Social Gatherings with Friends and Family:   . Attends Religious Services:   . Active Member of Clubs or Organizations:   . Attends Archivist Meetings:   Marland Kitchen Marital Status:      Family History: The patient's family history includes Asthma in her mother; Cancer (age of onset: 73) in her sister; Heart attack in her father, mother, sister, and sister; Heart disease in her father and mother; Hypertension in her mother, sister, and sister. ROS:   Please see the history of present illness.     All other systems reviewed and are negative.  EKGs/Labs/Other Studies Reviewed:    The following studies were reviewed today:   EKG:    May 07, 2019: Normal sinus rhythm at 88.  Occasional premature atrial contractions.  Old septal MI    Recent Labs: 05/27/2019: BUN 12; Creatinine, Ser 0.85; Platelets 237 05/30/2019: Hemoglobin 11.6; Potassium 3.9; Sodium 141 07/12/2019: ALT 17  Recent Lipid Panel    Component Value Date/Time   CHOL 109 07/12/2019 0925   TRIG 148 07/12/2019 0925   HDL 46 07/12/2019 0925   CHOLHDL 2.4 07/12/2019 0925   CHOLHDL 6.1 04/28/2015 0645   VLDL 60 (H) 04/28/2015 0645   LDLCALC 38 07/12/2019 0925    Physical Exam: Blood pressure 114/62, pulse 66, height 5\' 4"  (1.626 m), weight 160 lb (72.6 kg), SpO2 99 %.  GEN:  Well nourished, well developed in no acute distress HEENT: Normal NECK: No JVD; No carotid bruits LYMPHATICS: No lymphadenopathy CARDIAC: RRR , no murmurs, rubs, gallops RESPIRATORY:  Clear to auscultation  without rales, wheezing or rhonchi  ABDOMEN: Soft, non-tender, non-distended MUSCULOSKELETAL:  No edema; No deformity  SKIN: Warm and dry NEUROLOGIC:  Alert and oriented x 3    ASSESSMENT:    1. Mild aortic insufficiency   2. Essential hypertension    PLAN:    1. Shortness of breath with exertion: X. She is scheduled to have pulmonary function test at this would not be until December.  I have advised her to call me or her primary medical doctor if her breathing acutely worsens.  2. Hypertension:    Blood pressure looks great.  Continue current medications.  3. Hyperlipidemia: She is on atorvastatin.  She is not had lipids or liver enzymes checked in several years.  We will recheck today.  In order of problems listed above: Medication Adjustments/Labs and Tests Ordered: Current medicines are reviewed at length with the patient today.  Concerns regarding medicines are outlined above.  No orders of the defined types were placed in this encounter.  No orders of the defined types were placed in this encounter.    Signed, [name], MD  02/12/2020 5:24 PM    [place]

## 2020-02-12 NOTE — Patient Instructions (Signed)
Your physician recommends that you continue on your current medications as directed. Please refer to the Current Medication list given to you today.   Your physician wants you to follow-up in: [place] will receive a reminder letter in the mail two months in advance. If you don't receive a letter, please call our office to schedule the follow-up appointment.

## 2020-03-19 DIAGNOSIS — M19071 Primary osteoarthritis, right ankle and foot: Secondary | ICD-10-CM | POA: Diagnosis not present

## 2020-03-31 ENCOUNTER — Ambulatory Visit: Payer: Medicare Other | Admitting: Podiatry

## 2020-04-16 DIAGNOSIS — M1812 Unilateral primary osteoarthritis of first carpometacarpal joint, left hand: Secondary | ICD-10-CM | POA: Diagnosis not present

## 2020-05-10 ENCOUNTER — Other Ambulatory Visit: Payer: Self-pay | Admitting: Cardiovascular Disease

## 2020-06-05 DIAGNOSIS — R300 Dysuria: Secondary | ICD-10-CM | POA: Diagnosis not present

## 2020-06-16 DIAGNOSIS — Z23 Encounter for immunization: Secondary | ICD-10-CM | POA: Diagnosis not present

## 2020-06-29 DIAGNOSIS — R0600 Dyspnea, unspecified: Secondary | ICD-10-CM | POA: Diagnosis not present

## 2020-08-11 DIAGNOSIS — M79642 Pain in left hand: Secondary | ICD-10-CM | POA: Diagnosis not present

## 2020-08-24 DIAGNOSIS — F419 Anxiety disorder, unspecified: Secondary | ICD-10-CM | POA: Diagnosis not present

## 2020-08-24 DIAGNOSIS — F4321 Adjustment disorder with depressed mood: Secondary | ICD-10-CM | POA: Diagnosis not present

## 2020-09-13 ENCOUNTER — Other Ambulatory Visit: Payer: Self-pay | Admitting: Internal Medicine

## 2020-09-13 DIAGNOSIS — R0982 Postnasal drip: Secondary | ICD-10-CM

## 2020-09-29 ENCOUNTER — Other Ambulatory Visit: Payer: Self-pay | Admitting: Internal Medicine

## 2020-09-29 DIAGNOSIS — R0982 Postnasal drip: Secondary | ICD-10-CM

## 2020-10-01 ENCOUNTER — Other Ambulatory Visit: Payer: Self-pay

## 2020-10-01 ENCOUNTER — Other Ambulatory Visit: Payer: Medicare Other

## 2020-10-01 DIAGNOSIS — Z20822 Contact with and (suspected) exposure to covid-19: Secondary | ICD-10-CM

## 2020-10-03 ENCOUNTER — Telehealth: Payer: Self-pay

## 2020-10-03 LAB — NOVEL CORONAVIRUS, NAA: SARS-CoV-2, NAA: NOT DETECTED

## 2020-10-03 LAB — SARS-COV-2, NAA 2 DAY TAT

## 2020-10-03 NOTE — Telephone Encounter (Signed)

## 2020-10-06 DIAGNOSIS — M1812 Unilateral primary osteoarthritis of first carpometacarpal joint, left hand: Secondary | ICD-10-CM | POA: Diagnosis not present

## 2020-10-06 DIAGNOSIS — M654 Radial styloid tenosynovitis [de Quervain]: Secondary | ICD-10-CM | POA: Diagnosis not present

## 2020-10-06 DIAGNOSIS — F4321 Adjustment disorder with depressed mood: Secondary | ICD-10-CM | POA: Diagnosis not present

## 2020-10-06 DIAGNOSIS — F411 Generalized anxiety disorder: Secondary | ICD-10-CM | POA: Diagnosis not present

## 2020-10-06 DIAGNOSIS — R0600 Dyspnea, unspecified: Secondary | ICD-10-CM | POA: Diagnosis not present

## 2020-10-08 ENCOUNTER — Other Ambulatory Visit: Payer: Medicare Other

## 2020-11-12 DIAGNOSIS — K529 Noninfective gastroenteritis and colitis, unspecified: Secondary | ICD-10-CM | POA: Diagnosis not present

## 2020-11-12 DIAGNOSIS — R5383 Other fatigue: Secondary | ICD-10-CM | POA: Diagnosis not present

## 2020-11-19 ENCOUNTER — Other Ambulatory Visit: Payer: Self-pay

## 2020-11-19 ENCOUNTER — Encounter (HOSPITAL_COMMUNITY): Payer: Self-pay | Admitting: Pharmacy Technician

## 2020-11-19 ENCOUNTER — Encounter (HOSPITAL_COMMUNITY): Payer: Self-pay | Admitting: Emergency Medicine

## 2020-11-19 ENCOUNTER — Ambulatory Visit (HOSPITAL_COMMUNITY)
Admission: EM | Admit: 2020-11-19 | Discharge: 2020-11-19 | Disposition: A | Discharge status: Home / Self Care | Payer: Medicare Other

## 2020-11-19 ENCOUNTER — Emergency Department (HOSPITAL_COMMUNITY)
Admission: EM | Admit: 2020-11-19 | Discharge: 2020-11-19 | Disposition: A | Discharge status: Home / Self Care | Payer: Medicare Other | Attending: Emergency Medicine | Admitting: Emergency Medicine

## 2020-11-19 ENCOUNTER — Emergency Department (HOSPITAL_COMMUNITY): Payer: Medicare Other

## 2020-11-19 DIAGNOSIS — R079 Chest pain, unspecified: Secondary | ICD-10-CM | POA: Diagnosis not present

## 2020-11-19 DIAGNOSIS — J45909 Unspecified asthma, uncomplicated: Secondary | ICD-10-CM | POA: Insufficient documentation

## 2020-11-19 DIAGNOSIS — Z853 Personal history of malignant neoplasm of breast: Secondary | ICD-10-CM | POA: Diagnosis not present

## 2020-11-19 DIAGNOSIS — R0789 Other chest pain: Secondary | ICD-10-CM | POA: Insufficient documentation

## 2020-11-19 DIAGNOSIS — Z79899 Other long term (current) drug therapy: Secondary | ICD-10-CM | POA: Diagnosis not present

## 2020-11-19 DIAGNOSIS — M79602 Pain in left arm: Secondary | ICD-10-CM | POA: Diagnosis not present

## 2020-11-19 DIAGNOSIS — R519 Headache, unspecified: Secondary | ICD-10-CM | POA: Diagnosis not present

## 2020-11-19 DIAGNOSIS — I10 Essential (primary) hypertension: Secondary | ICD-10-CM | POA: Diagnosis not present

## 2020-11-19 DIAGNOSIS — R0602 Shortness of breath: Secondary | ICD-10-CM | POA: Insufficient documentation

## 2020-11-19 DIAGNOSIS — Z7902 Long term (current) use of antithrombotics/antiplatelets: Secondary | ICD-10-CM | POA: Diagnosis not present

## 2020-11-19 DIAGNOSIS — Z8673 Personal history of transient ischemic attack (TIA), and cerebral infarction without residual deficits: Secondary | ICD-10-CM | POA: Insufficient documentation

## 2020-11-19 LAB — CBC
HCT: 39.8 % (ref 36.0–46.0)
Hemoglobin: 13.8 g/dL (ref 12.0–15.0)
MCH: 30.1 pg (ref 26.0–34.0)
MCHC: 34.7 g/dL (ref 30.0–36.0)
MCV: 86.7 fL (ref 80.0–100.0)
Platelets: 215 10*3/uL (ref 150–400)
RBC: 4.59 MIL/uL (ref 3.87–5.11)
RDW: 13 % (ref 11.5–15.5)
WBC: 6.9 10*3/uL (ref 4.0–10.5)
nRBC: 0 % (ref 0.0–0.2)

## 2020-11-19 LAB — TROPONIN I (HIGH SENSITIVITY)
Troponin I (High Sensitivity): 4 ng/L (ref <18)
Troponin I (High Sensitivity): 4 ng/L (ref <18)

## 2020-11-19 LAB — BASIC METABOLIC PANEL
Anion gap: 8 (ref 5–15)
BUN: 11 mg/dL (ref 8–23)
CO2: 25 mmol/L (ref 22–32)
Calcium: 9.1 mg/dL (ref 8.9–10.3)
Chloride: 106 mmol/L (ref 98–111)
Creatinine, Ser: 0.8 mg/dL (ref 0.44–1.00)
GFR, Estimated: >60 mL/min (ref >60)
Glucose, Bld: 150 mg/dL — ABNORMAL HIGH (ref 70–99)
Potassium: 3.4 mmol/L — ABNORMAL LOW (ref 3.5–5.1)
Sodium: 139 mmol/L (ref 135–145)

## 2020-11-19 MED ORDER — IOHEXOL 350 MG/ML SOLN
65.0000 mL | Freq: Once | INTRAVENOUS | Status: AC | PRN
  Administered 2020-11-19: 65 mL via INTRAVENOUS

## 2020-11-19 NOTE — ED Triage Notes (Signed)
Pt presents with left arm pain that started 4 days ago. Also complaining of SOB, headache, and increased blood pressure. Denies chest pain but states having discomfort in chest. Denies any nausea, vomiting, changes in vision or speech.

## 2020-11-19 NOTE — ED Notes (Signed)
Pt ambulatory to [facility]

## 2020-11-19 NOTE — ED Provider Notes (Signed)
[place] EMERGENCY DEPARTMENT Provider Note   CSN: [id number] Arrival date & time: 11/19/20  1626     History Chief Complaint  Patient presents with  . Chest Pain  . Shortness of Breath    [name] is a 80 y.o. female.  80 year old female presents with 3 days of persistent left arm discomfort with new exertional chest discomfort x1 day.  Denies any fever or chills.  No cough or congestion.  Has also been having increased blood pressure at home.  Denies any headache or nausea.  Chest discomfort is better with rest and does not radiate into her back.  It has been associated with shortness of breath.  Left arm discomfort is somewhat positional.  Denies any focal neurological deficits.  Went to urgent care center and sent here for further management        Past Medical History:  Diagnosis Date  . Asthma   . Breast CA ([place])   . Cancer [facility])    right breast mastectomy  . DDD (degenerative disc disease), cervical   . H/O chest pain 03/24/2006   normal nuclear  study  . Headache   . High cholesterol   . Hypertension   . Mild aortic insufficiency 03/28/2006   echo  . Normal cardiac stress test 2005  . Personal history of chemotherapy   . Personal history of radiation therapy   . PONV (postoperative nausea and vomiting)   . Sinus bradycardia on ECG   . Stroke [facility])     Patient Active Problem List   Diagnosis Date Noted  . No-show for appointment 01/28/2020  . Angina pectoris ([place])   . History of stroke 06/13/2017  . DOE (dyspnea on exertion) 05/24/2017  . Occipital neuralgia of right side 11/02/2016  . Essential hypertension 09/23/2015  . Palpitations 06/17/2015  . Cerebrovascular accident (CVA) due to occlusion of left middle cerebral artery ([place]) 06/17/2015  . Hyperlipidemia 06/17/2015  . HLD (hyperlipidemia)   . Intracranial vascular stenosis   . Cerebral infarction due to occlusion of left middle cerebral artery ([place])   . CVA (cerebral  vascular accident) ([place]) 04/27/2015  . Stroke ([place]) 04/27/2015  . Cephalalgia   . Extrinsic asthma 07/02/2014  . Migraine variant with status migrainosus 07/02/2014  . Benign paroxysmal positional vertigo 07/02/2014  . 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 Acute pancreatitis 03/03/2014  . PUD (peptic ulcer disease) 03/03/2014  . HTN (hypertension) 03/03/2014  . Abdominal pain 03/03/2014  . Nausea with vomiting 03/03/2014  . Mild aortic insufficiency   . H/O chest pain   . Sinus bradycardia on ECG   . G E R D 03/24/2008  . ADENOCARCINOMA, BREAST, RIGHT 06/30/2007  . ASTHMA 06/30/2007  . Upper airway cough syndrome 06/30/2007    Past Surgical History:  Procedure Laterality Date  . APPENDECTOMY     "30 years ago"  . BREAST SURGERY    . childbirth     x 2  . CHOLECYSTECTOMY     "15 years ago"  . ESOPHAGOGASTRODUODENOSCOPY N/A 03/04/2014   Procedure: ESOPHAGOGASTRODUODENOSCOPY (EGD);  Surgeon: [name], MD;  Location: [facility] ENDOSCOPY;  Service: Endoscopy;  Laterality: N/A;  . MASTECTOMY  1993   right breast  . OTHER SURGICAL HISTORY     hysterectomy  . rectal fissure    . RIGHT/LEFT HEART CATH AND CORONARY ANGIOGRAPHY N/A 05/30/2019   Procedure: RIGHT/LEFT HEART CATH AND CORONARY ANGIOGRAPHY;  Surgeon: [name], MD;  Location: [facility] INVASIVE CV  LAB;  Service: Cardiovascular;  Laterality: N/A;  . TUBAL LIGATION       OB History    Gravida  2   Para  2   Term      Preterm      AB      Living  2     SAB      IAB      Ectopic      Multiple      Live Births              Family History  Problem Relation Age of Onset  . Heart attack Mother   . Heart disease Mother   . Hypertension Mother   . Asthma Mother   . Heart attack Father   . Heart disease Father   . Hypertension Sister   . Heart attack Sister   . Cancer Sister 72       colon  . Hypertension Sister   . Heart attack Sister     Social History   Tobacco Use  . Smoking  status: Never Smoker  . Smokeless tobacco: Never Used  Vaping Use  . Vaping Use: Never used  Substance Use Topics  . Alcohol use: No    Alcohol/week: 0.0 standard drinks  . Drug use: No    Home Medications Prior to Admission medications   Medication Sig Start Date End Date Taking? Authorizing Provider  albuterol (VENTOLIN HFA) 108 (90 Base) MCG/ACT inhaler Inhale 1-2 puffs into the lungs every 4 (four) hours as needed for wheezing or shortness of breath.    [provider]  amLODipine (NORVASC) 5 MG tablet Take 5 mg by mouth daily.    [provider]  atorvastatin (LIPITOR) 80 MG tablet Take 1 tablet (80 mg total) by mouth daily at 6 PM. 07/06/15   Rosalin Hawking, MD  clonazePAM (KLONOPIN) 1 MG tablet Take 1 mg by mouth 2 (two) times daily as needed. 08/24/20   [provider]  clopidogrel (PLAVIX) 75 MG tablet Take 1 tablet (75 mg total) by mouth daily. 06/01/17   Rosalin Hawking, MD  fluticasone (FLONASE) 50 MCG/ACT nasal spray Place 1 spray into both nostrils daily. 09/16/19   Spero Geralds, MD  Fluticasone-Salmeterol (ADVAIR) 250-50 MCG/DOSE AEPB Inhale 1 puff into the lungs 2 (two) times daily.    [provider]  isosorbide mononitrate (IMDUR) 30 MG 24 hr tablet TAKE 1 TABLET BY MOUTH  DAILY 05/12/20   [name], Wonda Cheng, MD  loratadine (CLARITIN) 10 MG tablet Take 10 mg by mouth daily as needed for allergies or rhinitis.     [provider]  metoprolol tartrate (LOPRESSOR) 25 MG tablet TAKE 1 TABLET BY MOUTH  TWICE DAILY 05/12/20   [name], Wonda Cheng, MD  montelukast (SINGULAIR) 10 MG tablet Take 1 tablet (10 mg total) by mouth at bedtime. 09/16/19   Spero Geralds, MD  nitroGLYCERIN (NITROSTAT) 0.4 MG SL tablet Place 1 tablet (0.4 mg total) under the tongue every 5 (five) minutes as needed for chest pain. 05/07/19 02/12/20  [name], Wonda Cheng, MD  nortriptyline (PAMELOR) 10 MG capsule Take 2 capsules (20 mg total) by mouth at bedtime. 11/29/17   Rosalin Hawking,  MD  pantoprazole (PROTONIX) 40 MG tablet Take 40 mg by mouth daily as needed (Acid Reflux).  06/05/17   [provider]  sertraline (ZOLOFT) 100 MG tablet Take 100 mg by mouth daily. 10/06/20   [provider]    Allergies  Doxycycline and Percocet [oxycodone-acetaminophen]  Review of Systems   Review of Systems  All other systems reviewed and are negative.   Physical Exam Updated Vital Signs BP (!) 179/88   Pulse 60   Temp 98.2 F (36.8 C)   Resp 18   Ht 1.651 m (5\' 5" )   Wt 70.3 kg   SpO2 96%   BMI 25.79 kg/m   Physical Exam Vitals and nursing note reviewed.  Constitutional:      General: [name] is not in acute distress.    Appearance: Normal appearance. [name] is well-developed. [name] is not toxic-appearing.  HENT:     Head: Normocephalic and atraumatic.  Eyes:     General: Lids are normal.     Conjunctiva/sclera: Conjunctivae normal.     Pupils: Pupils are equal, round, and reactive to light.  Neck:     Thyroid: No thyroid mass.     Trachea: No tracheal deviation.  Cardiovascular:     Rate and Rhythm: Normal rate and regular rhythm.     Heart sounds: Normal heart sounds. No murmur heard. No gallop.   Pulmonary:     Effort: Pulmonary effort is normal. No respiratory distress.     Breath sounds: Normal breath sounds. No stridor. No decreased breath sounds, wheezing, rhonchi or rales.  Abdominal:     General: Bowel sounds are normal. There is no distension.     Palpations: Abdomen is soft.     Tenderness: There is no abdominal tenderness. There is no rebound.  Musculoskeletal:        General: No tenderness. Normal range of motion.     Cervical back: Normal range of motion and neck supple.  Skin:    General: Skin is warm and dry.     Findings: No abrasion or rash.  Neurological:     Mental Status: [name] is alert and oriented to person, place, and time.     GCS: GCS eye subscore is 4. GCS verbal subscore is 5. GCS motor subscore is 6.     Cranial  Nerves: No cranial nerve deficit.     Sensory: No sensory deficit.  Psychiatric:        Speech: Speech normal.        Behavior: Behavior normal.     ED Results / Procedures / Treatments   Labs (all labs ordered are listed, but only abnormal results are displayed) Labs Reviewed  BASIC METABOLIC PANEL - Abnormal; Notable for the following components:      Result Value   Potassium 3.4 (*)    Glucose, Bld 150 (*)    All other components within normal limits  CBC  TROPONIN I (HIGH SENSITIVITY)  TROPONIN I (HIGH SENSITIVITY)    EKG None   ED ECG REPORT   Date: 11/19/2020  Rate: 67  Rhythm: normal sinus rhythm  QRS Axis: normal  Intervals: normal  ST/T Wave abnormalities: nonspecific ST changes  Conduction Disutrbances:none  Narrative Interpretation:   Old EKG Reviewed: unchanged  I have personally reviewed the EKG tracing and agree with the computerized printout as noted. Radiology DG Chest 2 View  Result Date: 11/19/2020 CLINICAL DATA:  Chest pain EXAM: CHEST - 2 VIEW COMPARISON:  05/23/2019 FINDINGS: Aortic calcifications are noted. There is no pneumothorax. No large pleural effusion. Chronic bronchitic changes are noted bilaterally. There is no acute osseous abnormality. There are old healed left-sided rib fractures. IMPRESSION: No active cardiopulmonary disease. Electronically Signed   By: Constance Holster M.D.   On:  11/19/2020 17:53    Procedures Procedures   Medications Ordered in ED Medications - No data to display  ED Course  I have reviewed the triage vital signs and the nursing notes.  Pertinent labs & imaging results that were available during my care of the patient were reviewed by me and considered in my medical decision making (see chart for details).    MDM Rules/Calculators/A&P                          Patient with negative delta troponin here.  CT of the chest was negative.  Labs are reassuring here.  Patient's arm pain upon further questioning  comes and goes and not associated with any symptoms concerning for limb ischemia.  Chest discomfort is more when [name] feels that [name] cannot get a deep breath.  No anginal quality to it.  Will discharge home Final Clinical Impression(s) / ED Diagnoses Final diagnoses:  None    Rx / DC Orders ED Discharge Orders    None       Lacretia Leigh, MD 11/19/20 2208

## 2020-11-19 NOTE — ED Provider Notes (Addendum)
[place]  ____________________________________________  Time seen: Approximately 4:01 PM  I have reviewed the triage vital signs and the nursing notes.   HISTORY  Chief Complaint Hypertension and Shortness of Breath   Historian Patient     HPI [name] is a 80 y.o. female with a history of CVA, aortic insufficiency, and hypertension, presents to the emergency department with headache, chest discomfort, shortness of breath and elevated blood pressure at home.  Patient states that she has been experiencing these symptoms over the past 3 days.  Shortness of breath and chest discomfort are worsened by exertion.  She denies changes in vision, nausea, vomiting or recent falls.  She states that her symptoms seemed to start with left upper extremity discomfort.  She denies numbness or tingling in the upper extremities. No other alleviating measures have been attempted.    Past Medical History:  Diagnosis Date  . Asthma   . Breast CA ([place])   . Cancer [facility])    right breast mastectomy  . DDD (degenerative disc disease), cervical   . H/O chest pain 03/24/2006   normal nuclear  study  . Headache   . High cholesterol   . Hypertension   . Mild aortic insufficiency 03/28/2006   echo  . Normal cardiac stress test 2005  . Personal history of chemotherapy   . Personal history of radiation therapy   . PONV (postoperative nausea and vomiting)   . Sinus bradycardia on ECG   . Stroke [facility])      Immunizations up to date:  Yes.     Past Medical History:  Diagnosis Date  . Asthma   . Breast CA ([place])   . Cancer [facility])    right breast mastectomy  . DDD (degenerative disc disease), cervical   . H/O chest pain 03/24/2006   normal nuclear  study  . Headache   . High cholesterol   . Hypertension   . Mild aortic insufficiency 03/28/2006   echo  . Normal cardiac stress test 2005  . Personal history of chemotherapy   . Personal history of radiation therapy   . PONV  (postoperative nausea and vomiting)   . Sinus bradycardia on ECG   . Stroke [facility])     Patient Active Problem List   Diagnosis Date Noted  . No-show for appointment 01/28/2020  . Angina pectoris ([place])   . History of stroke 06/13/2017  . DOE (dyspnea on exertion) 05/24/2017  . Occipital neuralgia of right side 11/02/2016  . Essential hypertension 09/23/2015  . Palpitations 06/17/2015  . Cerebrovascular accident (CVA) due to occlusion of left middle cerebral artery ([place]) 06/17/2015  . Hyperlipidemia 06/17/2015  . HLD (hyperlipidemia)   . Intracranial vascular stenosis   . Cerebral infarction due to occlusion of left middle cerebral artery ([place])   . CVA (cerebral vascular accident) ([place]) 04/27/2015  . Stroke ([place]) 04/27/2015  . Cephalalgia   . Extrinsic asthma 07/02/2014  . Migraine variant with status migrainosus 07/02/2014  . Benign paroxysmal positional vertigo 07/02/2014  . 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 Acute pancreatitis 03/03/2014  . PUD (peptic ulcer disease) 03/03/2014  . HTN (hypertension) 03/03/2014  . Abdominal pain 03/03/2014  . Nausea with vomiting 03/03/2014  . Mild aortic insufficiency   . H/O chest pain   . Sinus bradycardia on ECG   . G E R D 03/24/2008  . ADENOCARCINOMA, BREAST, RIGHT 06/30/2007  . ASTHMA 06/30/2007  . Upper airway cough syndrome 06/30/2007    Past  Surgical History:  Procedure Laterality Date  . APPENDECTOMY     "30 years ago"  . BREAST SURGERY    . childbirth     x 2  . CHOLECYSTECTOMY     "15 years ago"  . ESOPHAGOGASTRODUODENOSCOPY N/A 03/04/2014   Procedure: ESOPHAGOGASTRODUODENOSCOPY (EGD);  Surgeon: [name], MD;  Location: [facility] ENDOSCOPY;  Service: Endoscopy;  Laterality: N/A;  . MASTECTOMY  1993   right breast  . OTHER SURGICAL HISTORY     hysterectomy  . rectal fissure    . RIGHT/LEFT HEART CATH AND CORONARY ANGIOGRAPHY N/A 05/30/2019   Procedure: RIGHT/LEFT HEART CATH AND CORONARY ANGIOGRAPHY;   Surgeon: [name], MD;  Location: [place] CV LAB;  Service: Cardiovascular;  Laterality: N/A;  . TUBAL LIGATION      Prior to Admission medications   Medication Sig Start Date End Date Taking? Authorizing Provider  albuterol (VENTOLIN HFA) 108 (90 Base) MCG/ACT inhaler Inhale 1-2 puffs into the lungs every 4 (four) hours as needed for wheezing or shortness of breath.    [provider]  amLODipine (NORVASC) 5 MG tablet Take 5 mg by mouth daily.    [provider]  atorvastatin (LIPITOR) 80 MG tablet Take 1 tablet (80 mg total) by mouth daily at 6 PM. 07/06/15   Rosalin Hawking, MD  clonazePAM (KLONOPIN) 1 MG tablet Take 1 mg by mouth 2 (two) times daily as needed. 08/24/20   [provider]  clopidogrel (PLAVIX) 75 MG tablet Take 1 tablet (75 mg total) by mouth daily. 06/01/17   Rosalin Hawking, MD  fluticasone (FLONASE) 50 MCG/ACT nasal spray Place 1 spray into both nostrils daily. 09/16/19   Spero Geralds, MD  Fluticasone-Salmeterol (ADVAIR) 250-50 MCG/DOSE AEPB Inhale 1 puff into the lungs 2 (two) times daily.    [provider]  isosorbide mononitrate (IMDUR) 30 MG 24 hr tablet TAKE 1 TABLET BY MOUTH  DAILY 05/12/20   [name], Wonda Cheng, MD  loratadine (CLARITIN) 10 MG tablet Take 10 mg by mouth daily as needed for allergies or rhinitis.     [provider]  metoprolol tartrate (LOPRESSOR) 25 MG tablet TAKE 1 TABLET BY MOUTH  TWICE DAILY 05/12/20   [name], Wonda Cheng, MD  montelukast (SINGULAIR) 10 MG tablet Take 1 tablet (10 mg total) by mouth at bedtime. 09/16/19   Spero Geralds, MD  nitroGLYCERIN (NITROSTAT) 0.4 MG SL tablet Place 1 tablet (0.4 mg total) under the tongue every 5 (five) minutes as needed for chest pain. 05/07/19 02/12/20  [name], Wonda Cheng, MD  nortriptyline (PAMELOR) 10 MG capsule Take 2 capsules (20 mg total) by mouth at bedtime. 11/29/17   Rosalin Hawking, MD  pantoprazole (PROTONIX) 40 MG tablet Take 40 mg by mouth daily as needed  (Acid Reflux).  06/05/17   [provider]  sertraline (ZOLOFT) 100 MG tablet Take 100 mg by mouth daily. 10/06/20   [provider]    Allergies Doxycycline and Percocet [oxycodone-acetaminophen]  Family History  Problem Relation Age of Onset  . Heart attack Mother   . Heart disease Mother   . Hypertension Mother   . Asthma Mother   . Heart attack Father   . Heart disease Father   . Hypertension Sister   . Heart attack Sister   . Cancer Sister 57       colon  . Hypertension Sister   . Heart attack Sister     Social History Social History   Tobacco  Use  . Smoking status: Never Smoker  . Smokeless tobacco: Never Used  Vaping Use  . Vaping Use: Never used  Substance Use Topics  . Alcohol use: No    Alcohol/week: 0.0 standard drinks  . Drug use: No     Review of Systems  Constitutional: No fever/chills Eyes:  No discharge ENT: No upper respiratory complaints. Respiratory: no cough. No SOB/ use of accessory muscles to breath Gastrointestinal:   No nausea, no vomiting.  No diarrhea.  No constipation. Musculoskeletal: Negative for musculoskeletal pain. Skin: Negative for rash, abrasions, lacerations, ecchymosis.   ____________________________________________   PHYSICAL EXAM:  VITAL SIGNS: ED Triage Vitals  Enc Vitals Group     BP 11/19/20 1538 (!) 181/73     Pulse Rate 11/19/20 1538 71     Resp 11/19/20 1538 18     Temp 11/19/20 1538 98.2 F (36.8 C)     Temp Source 11/19/20 1538 Oral     SpO2 11/19/20 1538 97 %     Weight --      Height --      Head Circumference --      Peak Flow --      Pain Score 11/19/20 1534 4     Pain Loc --      Pain Edu? --      Excl. in [place]? --      Constitutional: Alert and oriented. Well appearing and in no acute distress. Eyes: Conjunctivae are normal. PERRL. EOMI. Head: Atraumatic. ENT:      Nose: No congestion/rhinnorhea.      Mouth/Throat: Mucous membranes are moist.  Neck: No stridor.  No  cervical spine tenderness to palpation. Hematological/Lymphatic/Immunilogical: No cervical lymphadenopathy. Cardiovascular: Normal rate, regular rhythm. Normal S1 and S2.  Good peripheral circulation. Respiratory: Normal respiratory effort without tachypnea or retractions. Lungs CTAB. Good air entry to the bases with no decreased or absent breath sounds Gastrointestinal: Bowel sounds x 4 quadrants. Soft and nontender to palpation. No guarding or rigidity. No distention. Musculoskeletal: Full range of motion to all extremities. No obvious deformities noted Neurologic:  Normal for age. No gross focal neurologic deficits are appreciated.  Skin:  Skin is warm, dry and intact. No rash noted. Psychiatric: Mood and affect are normal for age. Speech and behavior are normal.   ____________________________________________   LABS (all labs ordered are listed, but only abnormal results are displayed)  Labs Reviewed - No data to display ____________________________________________  EKG   ____________________________________________  RADIOLOGY   No results found.  ____________________________________________    PROCEDURES  Procedure(s) performed:     Procedures     Medications - No data to display   ____________________________________________   INITIAL IMPRESSION / ASSESSMENT AND PLAN / ED COURSE  Pertinent labs & imaging results that were available during my care of the patient were reviewed by me and considered in my medical decision making (see chart for details).       Assessment and plan Headache Shortness of breath Chest pain 80 year old female presents to the urgent care with left upper extremity pain, chest discomfort, shortness of breath and headache for the past 3 days.  Patient was hypertensive at triage but other vital signs are reassuring.  She was able to provide historical details.  Patient did seem breathless during history.  Given patient's history  and presenting symptoms, patient was referred to the emergency department for further care and management as diagnostic work-up is limited at the urgent care.    ____________________________________________  FINAL CLINICAL IMPRESSION(S) / ED DIAGNOSES  Final diagnoses:  Acute nonintractable headache, unspecified headache type  Chest pain, unspecified type  SOB (shortness of breath)      NEW MEDICATIONS STARTED DURING THIS VISIT:  ED Discharge Orders    None          This chart was dictated using voice recognition software/Dragon. Despite best efforts to proofread, errors can occur which can change the meaning. Any change was purely unintentional.     Lannie Fields, PA-C 11/19/20 [place], Clessie Karras [place], [place] 11/19/20 1607

## 2020-11-19 NOTE — ED Notes (Signed)
Patient is being discharged from the Urgent Care and sent to the Emergency Department via POV with family member . Per Aquilla Solian, PA, patient is in need of higher level of care due to HTN, headache, SOB, and chest discomfort. Patient is aware and verbalizes understanding of plan of care.  Vitals:   11/19/20 1538  BP: (!) 181/73  Pulse: 71  Resp: 18  Temp: 98.2 F (36.8 C)  SpO2: 97%

## 2020-11-19 NOTE — ED Triage Notes (Signed)
Pt here with L arm pain, chest "heaviness", shob and hypertension. Pt went to UC and sent here for further workup. Pt reports all symptoms started approx 4 days ago.

## 2020-11-19 NOTE — Discharge Instructions (Addendum)
Please go to local emergency department for further care and management

## 2020-12-03 DIAGNOSIS — F411 Generalized anxiety disorder: Secondary | ICD-10-CM | POA: Diagnosis not present

## 2020-12-03 DIAGNOSIS — R197 Diarrhea, unspecified: Secondary | ICD-10-CM | POA: Diagnosis not present

## 2020-12-03 DIAGNOSIS — R0600 Dyspnea, unspecified: Secondary | ICD-10-CM | POA: Diagnosis not present

## 2020-12-03 DIAGNOSIS — F4321 Adjustment disorder with depressed mood: Secondary | ICD-10-CM | POA: Diagnosis not present

## 2020-12-03 DIAGNOSIS — I10 Essential (primary) hypertension: Secondary | ICD-10-CM | POA: Diagnosis not present

## 2020-12-10 ENCOUNTER — Other Ambulatory Visit: Payer: Self-pay | Admitting: Podiatrist

## 2020-12-10 ENCOUNTER — Ambulatory Visit (INDEPENDENT_AMBULATORY_CARE_PROVIDER_SITE_OTHER): Payer: Medicare Other | Admitting: Podiatrist

## 2020-12-10 ENCOUNTER — Encounter: Payer: Self-pay | Admitting: Podiatrist

## 2020-12-10 ENCOUNTER — Ambulatory Visit (INDEPENDENT_AMBULATORY_CARE_PROVIDER_SITE_OTHER): Payer: Medicare Other

## 2020-12-10 ENCOUNTER — Other Ambulatory Visit: Payer: Self-pay

## 2020-12-10 DIAGNOSIS — M21612 Bunion of left foot: Secondary | ICD-10-CM

## 2020-12-10 DIAGNOSIS — R600 Localized edema: Secondary | ICD-10-CM | POA: Diagnosis not present

## 2020-12-10 DIAGNOSIS — M109 Gout, unspecified: Secondary | ICD-10-CM | POA: Diagnosis not present

## 2020-12-10 MED ORDER — COLCHICINE 0.6 MG PO TABS: 0.6000 mg | ORAL_TABLET | Freq: Every day | ORAL | 1 refills | Status: DC

## 2020-12-10 MED ORDER — TRIAMCINOLONE ACETONIDE 10 MG/ML IJ SUSP: 10.0000 mg | Freq: Once | INTRAMUSCULAR | Status: DC

## 2020-12-10 NOTE — Patient Instructions (Signed)

## 2020-12-10 NOTE — Progress Notes (Signed)
Chief Complaint  Patient presents with  . Foot Pain    (np) left foot pain/swollen     HPI: Patient is 80 y.o. female who presents today for the concerns as listed above.  She relates the left foot became painful approximately 3 days ago and she cannot recall any injury to the foot.  She states she woke up and the foot was swollen, hot and red.    Patient Active Problem List   Diagnosis Date Noted  . No-show for appointment 01/28/2020  . Angina pectoris ([place])   . History of stroke 06/13/2017  . DOE (dyspnea on exertion) 05/24/2017  . Occipital neuralgia of right side 11/02/2016  . Essential hypertension 09/23/2015  . Palpitations 06/17/2015  . Cerebrovascular accident (CVA) due to occlusion of left middle cerebral artery ([place]) 06/17/2015  . Hyperlipidemia 06/17/2015  . HLD (hyperlipidemia)   . Intracranial vascular stenosis   . Cerebral infarction due to occlusion of left middle cerebral artery ([place])   . CVA (cerebral vascular accident) ([place]) 04/27/2015  . Stroke ([place]) 04/27/2015  . Cephalalgia   . Extrinsic asthma 07/02/2014  . Migraine variant with status migrainosus 07/02/2014  . Benign paroxysmal positional vertigo 07/02/2014  . 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 Acute pancreatitis 03/03/2014  . PUD (peptic ulcer disease) 03/03/2014  . HTN (hypertension) 03/03/2014  . Abdominal pain 03/03/2014  . Nausea with vomiting 03/03/2014  . Mild aortic insufficiency   . H/O chest pain   . Sinus bradycardia on ECG   . G E R D 03/24/2008  . ADENOCARCINOMA, BREAST, RIGHT 06/30/2007  . ASTHMA 06/30/2007  . Upper airway cough syndrome 06/30/2007    Current Outpatient Medications on File Prior to Visit  Medication Sig Dispense Refill  . albuterol (VENTOLIN HFA) 108 (90 Base) MCG/ACT inhaler Inhale 1-2 puffs into the lungs every 4 (four) hours as needed for wheezing or shortness of breath.    Marland Kitchen amLODipine (NORVASC) 5 MG tablet Take 5 mg by mouth daily.    Marland Kitchen atorvastatin  (LIPITOR) 80 MG tablet Take 1 tablet (80 mg total) by mouth daily at 6 PM. 30 tablet 3  . clonazePAM (KLONOPIN) 1 MG tablet Take 1 mg by mouth 2 (two) times daily as needed.    . clopidogrel (PLAVIX) 75 MG tablet Take 1 tablet (75 mg total) by mouth daily. 90 tablet 2  . fluticasone (FLONASE) 50 MCG/ACT nasal spray Place 1 spray into both nostrils daily. 16 g 2  . Fluticasone-Salmeterol (ADVAIR) 250-50 MCG/DOSE AEPB Inhale 1 puff into the lungs 2 (two) times daily.    . isosorbide mononitrate (IMDUR) 30 MG 24 hr tablet TAKE 1 TABLET BY MOUTH  DAILY 90 tablet 2  . loratadine (CLARITIN) 10 MG tablet Take 10 mg by mouth daily as needed for allergies or rhinitis.     . metoprolol tartrate (LOPRESSOR) 25 MG tablet TAKE 1 TABLET BY MOUTH  TWICE DAILY 180 tablet 2  . montelukast (SINGULAIR) 10 MG tablet Take 1 tablet (10 mg total) by mouth at bedtime. 90 tablet 3  . nitroGLYCERIN (NITROSTAT) 0.4 MG SL tablet Place 1 tablet (0.4 mg total) under the tongue every 5 (five) minutes as needed for chest pain. 25 tablet 6  . nortriptyline (PAMELOR) 10 MG capsule Take 2 capsules (20 mg total) by mouth at bedtime. 180 capsule 3  . pantoprazole (PROTONIX) 40 MG tablet Take 40 mg by mouth daily as needed (Acid Reflux).   2  . sertraline (ZOLOFT) 100 MG tablet  Take 100 mg by mouth daily.     No current facility-administered medications on file prior to visit.    Allergies  Allergen Reactions  . Doxycycline Nausea And Vomiting    REACTION: GI upset  . Percocet [Oxycodone-Acetaminophen] Nausea And Vomiting    Review of Systems No fevers, chills, nausea, muscle aches, no difficulty breathing, no calf pain, no chest pain or shortness of breath.   Physical Exam  GENERAL APPEARANCE: Alert, conversant. Appropriately groomed. No acute distress.   VASCULAR: Pedal pulses palpable DP and PT bilateral.  Capillary refill time is immediate to all digits,  Proximal to distal cooling it warm to warm.  Digital perfusion  adequate.   NEUROLOGIC: sensation is intact to 5.07 monofilament at 5/5 sites bilateral.  Light touch is intact bilateral, vibratory sensation intact bilateral  MUSCULOSKELETAL: acceptable muscle strength, tone and stability bilateral.  Swelling and calor noted at the first metatarsal phalangeal joint.  Pain with range of motion noted. Mild bunion deformity is present.   DERMATOLOGIC: skin is warm, supple, and dry.  No open lesions noted.  No rash, no pre ulcerative lesions. Digital nails are asymptomatic.    Radiographic exam:  Normal osseous mineralization.  No fracture or dislocation or acute osseous abnormalities present.  Joint spaces are normal.  Hallux abducto valgus deformity is present.,,  Arthritic changes at the head of the first metatarsal seen. Soft tissue swelling at the first mp joint is also noted.    Assessment     ICD-10-CM   1. Localized edema  R60.0 CANCELED: DG Foot Complete Left  2. Acute gout involving toe of left foot, unspecified cause  M10.9 triamcinolone acetonide (KENALOG) 10 MG/ML injection 10 mg     Plan  - discussed exam and xray findings and discussed the possiblity of this being gout.  She relates she had gout several years ago but doesn't recall where- it was not in her foot at that time. - I recommended a steroid injectin and she agreed.  I injected. 10mg  kenalog and 0.5% marcaine plain in to the first mpj without complication.  - rx for colchicine written and instructions for use were given to the patient. -she will be seen back in 2-3 weeks for a recheck and will call if there is no improvement in 3-5 days.

## 2020-12-16 ENCOUNTER — Other Ambulatory Visit: Payer: Self-pay | Admitting: Family Medicine

## 2020-12-16 DIAGNOSIS — Z1231 Encounter for screening mammogram for malignant neoplasm of breast: Secondary | ICD-10-CM

## 2020-12-21 ENCOUNTER — Ambulatory Visit: Payer: Medicare Other | Admitting: Podiatry

## 2020-12-24 DIAGNOSIS — L650 Telogen effluvium: Secondary | ICD-10-CM | POA: Diagnosis not present

## 2020-12-24 DIAGNOSIS — L821 Other seborrheic keratosis: Secondary | ICD-10-CM | POA: Diagnosis not present

## 2020-12-24 DIAGNOSIS — T148XXA Other injury of unspecified body region, initial encounter: Secondary | ICD-10-CM | POA: Diagnosis not present

## 2021-01-04 ENCOUNTER — Other Ambulatory Visit: Payer: Self-pay | Admitting: Podiatrist

## 2021-01-05 NOTE — Telephone Encounter (Signed)
OK'd request for colchicine refill.

## 2021-01-05 NOTE — Telephone Encounter (Signed)
Please advise 

## 2021-01-14 ENCOUNTER — Other Ambulatory Visit: Payer: Self-pay | Admitting: Cardiovascular Disease

## 2021-01-19 ENCOUNTER — Other Ambulatory Visit: Payer: Self-pay | Admitting: Podiatrist

## 2021-01-19 DIAGNOSIS — M1812 Unilateral primary osteoarthritis of first carpometacarpal joint, left hand: Secondary | ICD-10-CM | POA: Diagnosis not present

## 2021-01-19 DIAGNOSIS — M654 Radial styloid tenosynovitis [de Quervain]: Secondary | ICD-10-CM | POA: Diagnosis not present

## 2021-01-19 NOTE — Telephone Encounter (Signed)
Please advise 

## 2021-01-25 DIAGNOSIS — R350 Frequency of micturition: Secondary | ICD-10-CM | POA: Diagnosis not present

## 2021-01-26 DIAGNOSIS — N3000 Acute cystitis without hematuria: Secondary | ICD-10-CM | POA: Diagnosis not present

## 2021-01-26 DIAGNOSIS — F419 Anxiety disorder, unspecified: Secondary | ICD-10-CM | POA: Diagnosis not present

## 2021-01-26 DIAGNOSIS — R0602 Shortness of breath: Secondary | ICD-10-CM | POA: Diagnosis not present

## 2021-02-02 ENCOUNTER — Telehealth: Payer: Self-pay | Admitting: Physician Assistant

## 2021-02-02 NOTE — Telephone Encounter (Signed)
Spoke with pt who states she saw her PCP last week for SOB and irregular heart beat.  Pt has been scheduled to see Leslie Husk, PA 02/03/2021 for further evaluation.  Pt reports her PCP prescribed an inhaler for the SOB on exertion which she has had for 2 months.  Pt states PCP also advised she was having PAC's.  Pt remains SOB with exertion and states she can feel the PAC's.  Pt denies current CP or new SOB.  Reassured pt PAC's are usually benign but can be bothersome for patients that can feel them.   Pt advised to continue current medications, keep 02/03/2021 appointment with M.Lenze, PA.  Reviewed ED precautions.  Pt verbalizes understanding and agrees with current plan.

## 2021-02-02 NOTE — Telephone Encounter (Signed)
Patient c/o Palpitations:  High priority if patient c/o lightheadedness, shortness of breath, or chest pain  1) How long have you had palpitations/irregular HR/ Afib? Are you having the symptoms now? Not sure how long it has been occurring, but is symptoms now was advised to call and schedule by her PCP she saw him on 01/28/21  2) Are you currently experiencing lightheadedness, SOB or CP? SOB   3) Do you have a history of afib (atrial fibrillation) or irregular heart rhythm? Not sure if she has a history of it  4) Have you checked your BP or HR? (document readings if available): Has not been checked since appt with PCP   5) Are you experiencing any other symptoms? Headaches, PAC (per PCP), cough and occasional CP   Pt c/o of Chest Pain: STAT if CP now or developed within 24 hours  1. Are you having CP right now? No   2. Are you experiencing any other symptoms (ex. SOB, nausea, vomiting, sweating)? See above answer for question 5   3. How long have you been experiencing CP? Off and on since 01/28/21. Had an episode 01/28/21 and yesterday   4. Is your CP continuous or coming and going? Coming and going   5. Have you taken Nitroglycerin? No, doesn't feel it was severe enough    Pt c/o Shortness Of Breath: STAT if SOB developed within the last 24 hours or pt is noticeably SOB on the phone  1. Are you currently SOB (can you hear that pt is SOB on the phone)? Yes, cannot hear over the phone   2. How long have you been experiencing SOB? Couple months   3. Are you SOB when sitting or when up moving around? Moving around   4. Are you currently experiencing any other symptoms? See answer 5 to "PALPS" dot phrase  ?

## 2021-02-03 ENCOUNTER — Ambulatory Visit: Payer: Medicare Other

## 2021-02-03 ENCOUNTER — Encounter: Payer: Self-pay | Admitting: Radiology

## 2021-02-03 ENCOUNTER — Other Ambulatory Visit: Payer: Self-pay

## 2021-02-03 ENCOUNTER — Encounter: Payer: Self-pay | Admitting: Physician Assistant

## 2021-02-03 ENCOUNTER — Ambulatory Visit (INDEPENDENT_AMBULATORY_CARE_PROVIDER_SITE_OTHER): Payer: Medicare Other | Admitting: Physician Assistant

## 2021-02-03 VITALS — BP 138/72 | HR 72 | Ht 65.0 in | Wt 154.2 lb

## 2021-02-03 DIAGNOSIS — E785 Hyperlipidemia, unspecified: Secondary | ICD-10-CM | POA: Diagnosis not present

## 2021-02-03 DIAGNOSIS — I491 Atrial premature depolarization: Secondary | ICD-10-CM

## 2021-02-03 DIAGNOSIS — Z634 Disappearance and death of family member: Secondary | ICD-10-CM

## 2021-02-03 DIAGNOSIS — R0600 Dyspnea, unspecified: Secondary | ICD-10-CM | POA: Diagnosis not present

## 2021-02-03 DIAGNOSIS — I10 Essential (primary) hypertension: Secondary | ICD-10-CM

## 2021-02-03 DIAGNOSIS — I25118 Atherosclerotic heart disease of native coronary artery with other forms of angina pectoris: Secondary | ICD-10-CM

## 2021-02-03 DIAGNOSIS — F4321 Adjustment disorder with depressed mood: Secondary | ICD-10-CM

## 2021-02-03 DIAGNOSIS — R0609 Other forms of dyspnea: Secondary | ICD-10-CM

## 2021-02-03 LAB — BASIC METABOLIC PANEL
BUN/Creatinine Ratio: 11 — ABNORMAL LOW (ref 12–28)
BUN: 8 mg/dL (ref 8–27)
CO2: 23 mmol/L (ref 20–29)
Calcium: 9.2 mg/dL (ref 8.7–10.3)
Chloride: 102 mmol/L (ref 96–106)
Creatinine, Ser: 0.74 mg/dL (ref 0.57–1.00)
Glucose: 135 mg/dL — ABNORMAL HIGH (ref 65–99)
Potassium: 3.9 mmol/L (ref 3.5–5.2)
Sodium: 140 mmol/L (ref 134–144)
eGFR: 81 mL/min/{1.73_m2} (ref >59)

## 2021-02-03 NOTE — Progress Notes (Signed)
Cardiology Office Note    Date:  02/03/2021   ID:  [name] Jan 15, 1941, MRN [id number]   PCP:  [name], [place]  Cardiologist:  [name], MD  Advanced Practice Provider:  No care team member to display Electrophysiologist:  None   [phone]   No chief complaint on file.   History of Present Illness:  [name] is a 80 y.o. female with tree of hypertension, HLD, CVA on Plavix, chronic dyspnea on exertion felt secondary to asthma sees pulmonary in [place].  Had severe dyspnea on exertion 05/2019 and underwent right and left heart cath.  This showed 30% mid LAD, normal LV function, right heart pressures unremarkable, mild AI.  Patient saw Dr. [name] 02/2020 with chronic dyspnea on exertion no other complaints.  Patient was added onto my schedule today because of worsening dyspnea on exertion for the past 2 months treated with inhalers.  Was also told by PCP that she was having PACs. Patient says she has a UTI and nausea. DOE seems to have worsened and coughing. Having pain in back moves to chest -difficult to explain-takes a tylenol and lays down. Has been going on since she had her UTI. No worse with activity. Happens 4-5 times a day.Feels like she has to burp. Scattered thoughts and complaints. Asking if it could be from stress-she lost her son 6 months ago. Has been in grief counseling over the phone but doesn't think it's helping. She wants to go into an office.    Past Medical History:  Diagnosis Date  . Asthma   . Breast CA ([place])   . Cancer [facility])    right breast mastectomy  . DDD (degenerative disc disease), cervical   . H/O chest pain 03/24/2006   normal nuclear  study  . Headache   . High cholesterol   . Hypertension   . Mild aortic insufficiency 03/28/2006   echo  . Normal cardiac stress test 2005  . Personal history of chemotherapy   . Personal history of radiation therapy   . PONV (postoperative nausea  and vomiting)   . Sinus bradycardia on ECG   . Stroke [facility])     Past Surgical History:  Procedure Laterality Date  . APPENDECTOMY     "30 years ago"  . BREAST SURGERY    . childbirth     x 2  . CHOLECYSTECTOMY     "15 years ago"  . ESOPHAGOGASTRODUODENOSCOPY N/A 03/04/2014   Procedure: ESOPHAGOGASTRODUODENOSCOPY (EGD);  Surgeon: [name], MD;  Location: [facility] ENDOSCOPY;  Service: Endoscopy;  Laterality: N/A;  . MASTECTOMY  1993   right breast  . OTHER SURGICAL HISTORY     hysterectomy  . rectal fissure    . RIGHT/LEFT HEART CATH AND CORONARY ANGIOGRAPHY N/A 05/30/2019   Procedure: RIGHT/LEFT HEART CATH AND CORONARY ANGIOGRAPHY;  Surgeon: [name], MD;  Location: [facility] CV LAB;  Service: Cardiovascular;  Laterality: N/A;  . TUBAL LIGATION      Current Medications: Current Meds  Medication Sig  . albuterol (VENTOLIN HFA) 108 (90 Base) MCG/ACT inhaler Inhale 1-2 puffs into the lungs every 4 (four) hours as needed for wheezing or shortness of breath.  Marland Kitchen amLODipine (NORVASC) 5 MG tablet Take 5 mg by mouth daily.  Marland Kitchen atorvastatin (LIPITOR) 80 MG tablet Take 1 tablet (80 mg total) by mouth daily at 6 PM.  . clonazePAM (KLONOPIN) 1 MG tablet Take 1 mg  by mouth 2 (two) times daily as needed.  . clopidogrel (PLAVIX) 75 MG tablet Take 1 tablet (75 mg total) by mouth daily.  . colchicine 0.6 MG tablet TAKE 1 TABLET (0.6 MG TOTAL) BY MOUTH DAILY. TAKE AT ONSET OF GOUT ATTACK DAILY FOR 3-5 DAYS  . Fluticasone-Salmeterol (ADVAIR) 250-50 MCG/DOSE AEPB Inhale 1 puff into the lungs 2 (two) times daily.  . isosorbide mononitrate (IMDUR) 30 MG 24 hr tablet TAKE 1 TABLET BY MOUTH  DAILY  . loratadine (CLARITIN) 10 MG tablet Take 10 mg by mouth daily as needed for allergies or rhinitis.   . metoprolol tartrate (LOPRESSOR) 25 MG tablet TAKE 1 TABLET BY MOUTH  TWICE DAILY  . montelukast (SINGULAIR) 10 MG tablet Take 1 tablet (10 mg total) by mouth at bedtime.  . nortriptyline  (PAMELOR) 10 MG capsule Take 2 capsules (20 mg total) by mouth at bedtime.  . pantoprazole (PROTONIX) 40 MG tablet Take 40 mg by mouth daily as needed (Acid Reflux).   . sertraline (ZOLOFT) 100 MG tablet Take 100 mg by mouth daily.   Current Facility-Administered Medications for the 02/03/21 encounter (Office Visit) with [name], PA-C  Medication  . triamcinolone acetonide (KENALOG) 10 MG/ML injection 10 mg     Allergies:   Doxycycline and Percocet [oxycodone-acetaminophen]   Social History   Socioeconomic History  . Marital status: Married    Spouse name: [name]   . Number of children: 4  . Years of education: 12+  . Highest education level: Not on file  Occupational History  . Not on file  Tobacco Use  . Smoking status: Never Smoker  . Smokeless tobacco: Never Used  Vaping Use  . Vaping Use: Never used  Substance and Sexual Activity  . Alcohol use: No    Alcohol/week: 0.0 standard drinks  . Drug use: No  . Sexual activity: Not Currently  Other Topics Concern  . Not on file  Social History Narrative   Right handed, caffeine 2 cups daily, married, 4 kids, Retired.  [employer].    Social Determinants of Health   Financial Resource Strain: Not on file  Food Insecurity: Not on file  Transportation Needs: Not on file  Physical Activity: Not on file  Stress: Not on file  Social Connections: Not on file     Family History:  The patient's family history includes Asthma in her mother; Cancer (age of onset: 51) in her sister; Heart attack in her father, mother, sister, and sister; Heart disease in her father and mother; Hypertension in her mother, sister, and sister.   ROS:   Please see the history of present illness.    ROS All other systems reviewed and are negative.   PHYSICAL EXAM:   VS:  BP 138/72   Pulse 72   Ht '5\' 5"'  (1.651 m)   Wt 154 lb 3.2 oz (69.9 kg)   SpO2 95%   BMI 25.66 kg/m   Physical Exam  GEN: Well nourished, well developed, in no acute  distress  Neck: no JVD, carotid bruits, or masses Cardiac:RRR; no murmurs, rubs, or gallops  Respiratory:  clear to auscultation bilaterally, normal work of breathing GI: soft, nontender, nondistended, + BS Ext: without cyanosis, clubbing, or edema, Good distal pulses bilaterally Neuro:  Alert and Oriented x 3 Psych: euthymic mood, full affect  Wt Readings from Last 3 Encounters:  02/03/21 154 lb 3.2 oz (69.9 kg)  11/19/20 155 lb (70.3 kg)  02/12/20 160 lb (72.6 kg)  Studies/Labs Reviewed:   EKG:  EKG is ordered today.  The ekg ordered today demonstrates normal sinus rhythm with poor anterior R waves, no acute change  Recent Labs: 11/19/2020: BUN 11; Creatinine, Ser 0.80; Hemoglobin 13.8; Platelets 215; Potassium 3.4; Sodium 139   Lipid Panel    Component Value Date/Time   CHOL 109 07/12/2019 0925   TRIG 148 07/12/2019 0925   HDL 46 07/12/2019 0925   CHOLHDL 2.4 07/12/2019 0925   CHOLHDL 6.1 04/28/2015 0645   VLDL 60 (H) 04/28/2015 0645   LDLCALC 38 07/12/2019 0925    Additional studies/ records that were reviewed today include:  Cardiac cath 05/30/2019   Mid LAD lesion is 30% stenosed.  The left ventricular systolic function is normal.  LV end diastolic pressure is normal. LVEDP 6 mm Hg.  The left ventricular ejection fraction is 55-65% by visual estimate.  There is no aortic valve stenosis.  Ao sat 100%, PA sat 81%, mean PA pressure 14 mm Hg; mean PCWP 5 mm Hg, CO 7.3 L/min; CI 4.1   Nonobstructive CAD.  Continue preventive therapy.     Risk Assessment/Calculations:         ASSESSMENT:    1. Premature atrial contractions   2. Grief at loss of child   3. DOE (dyspnea on exertion)   4. Coronary artery disease of native artery of native heart with stable angina pectoris ([place])   5. Essential hypertension   6. Hyperlipidemia, unspecified hyperlipidemia type      PLAN:  In order of problems listed above:  PACs per PCP.  Heart rate regular on  exam and only 1 PAC on EKG today.  Potassium 3.4 in March.  We will check be met and order monitor.  Patient having multiple unrelated complaints with recent UTI, nausea, sensation of burping, back pain into her chest, stress and anxiety over the loss of her son 6 months ago.  EKG without acute change today.  Cath less than 2 years ago reassuring.  Will await monitor before any further testing is ordered.  Grief over the loss of her son.  Grief counseling through [facility] over the phone does not seem to be helping.  We will refer her to psychiatry at her request-Dr. Toy Care  Dyspnea on exertion chronic but has worsened over the past 2 months associated with dry cough treated with inhalers by PCP.  Right and left heart cath unremarkable/2020.  Has not seen her pulmonologist in [facility] in a long time.  I have asked her to make an appointment.  Nonobstructive CAD atypical chest pain  Hypertension blood pressure controlled  Hyperlipidemia managed by PCP  Shared Decision Making/Informed Consent        Medication Adjustments/Labs and Tests Ordered: Current medicines are reviewed at length with the patient today.  Concerns regarding medicines are outlined above.  Medication changes, Labs and Tests ordered today are listed in the Patient Instructions below. Patient Instructions  Medication Instructions:  Your physician recommends that you continue on your current medications as directed. Please refer to the Current Medication list given to you today.   Labwork: You will have labs drawn today: BMP  Testing/Procedures:  ZIO XT- Long Term Monitor Instructions   Your physician has requested you wear a ZIO patch monitor for ___ days.  This is a single patch monitor.   [employer] supplies one patch monitor per enrollment. Additional stickers are not available. Please do not apply patch if you will be having a Nuclear Stress Test,  Echocardiogram, Cardiac CT, MRI, or Chest Xray during the period you would  be wearing the monitor. The patch cannot be worn during these tests. You cannot remove and re-apply the ZIO XT patch monitor.  Your ZIO patch monitor will be sent Fed Ex from [employer] directly to your home address. It may take 3-5 days to receive your monitor after you have been enrolled.  Once you have received your monitor, please review the enclosed instructions. Your monitor has already been registered assigning a specific monitor serial # to you.  Billing and Patient Assistance Program Information   We have supplied [employer] with any of your insurance information on file for billing purposes. [employer] offers a sliding scale Patient Assistance Program for patients that do not have insurance, or whose insurance does not completely cover the cost of the ZIO monitor.   You must apply for the Patient Assistance Program to qualify for this discounted rate.     To apply, please call [employer] at [phone], select option 4, then select option 2, and ask to apply for Patient Assistance Program.  Theodore Demark will ask your household income, and how many people are in your household.  They will quote your out-of-pocket cost based on that information.  [employer] will also be able to set up a 39-month interest-free payment plan if needed.  Applying the monitor   Shave hair from upper left chest.  Hold abrader disc by orange tab. Rub abrader in 40 strokes over the upper left chest as indicated in your monitor instructions.  Clean area with 4 enclosed alcohol pads. Let dry.  Apply patch as indicated in monitor instructions. Patch will be placed under collarbone on left side of chest with arrow pointing upward.  Rub patch adhesive wings for 2 minutes. Remove white label marked "1". Remove the white label marked "2". Rub patch adhesive wings for 2 additional minutes.  While looking in a mirror, press and release button in center of patch. A small green light will flash 3-4 times. This will be your only  indicator that the monitor has been turned on. ?  Do not shower for the first 24 hours. You may shower after the first 24 hours.  Press the button if you feel a symptom. You will hear a small click. Record Date, Time and Symptom in the Patient Logbook.  When you are ready to remove the patch, follow instructions on the last 2 pages of the Patient Logbook. Stick patch monitor onto the last page of Patient Logbook.  Place Patient Logbook in the blue and white box.  Use locking tab on box and tape box closed securely.  The blue and white box has prepaid postage on it. Please place it in the mailbox as soon as possible. Your physician should have your test results approximately 7 days after the monitor has been mailed back to [facility]  Call [employer] [phone] you have questions regarding your ZIO XT patch monitor. Call them immediately if you see an orange light blinking on your monitor.  If your monitor falls off in less than 4 days, contact our Monitor department at [phone] ?If your monitor becomes loose or falls off after 4 days call [employer] at [phone] suggestions on securing your monitor.?   Follow-Up:  Follow up appointment with Dr. NAcie Fredrickson- Monday July 25 @ 9:45am  Follow up with your pulmonologist within the month.   [facility] [phone]  Any Other Special Instructions Will Be Listed Below (If Applicable).     If you need a refill on your cardiac medications before your next appointment, please call your pharmacy.      Sumner Boast, PA-C  02/03/2021 9:57 AM    [facility] [place], [place], [place]  [address] Phone: [phone]; Fax: [phone]

## 2021-02-03 NOTE — Progress Notes (Signed)
Enrolled patient for a 14 day Zio XT  monitor to be mailed to patients home  °

## 2021-02-03 NOTE — Patient Instructions (Addendum)
Medication Instructions:  Your physician recommends that you continue on your current medications as directed. Please refer to the Current Medication list given to you today.   Labwork: You will have labs drawn today: BMP  Testing/Procedures:  ZIO XT- Long Term Monitor Instructions   Your physician has requested you wear a ZIO patch monitor for ___ days.  This is a single patch monitor.   [employer] supplies one patch monitor per enrollment. Additional stickers are not available. Please do not apply patch if you will be having a Nuclear Stress Test, Echocardiogram, Cardiac CT, MRI, or Chest Xray during the period you would be wearing the monitor. The patch cannot be worn during these tests. You cannot remove and re-apply the ZIO XT patch monitor.  Your ZIO patch monitor will be sent Fed Ex from [employer] directly to your home address. It may take 3-5 days to receive your monitor after you have been enrolled.  Once you have received your monitor, please review the enclosed instructions. Your monitor has already been registered assigning a specific monitor serial # to you.  Billing and Patient Assistance Program Information   We have supplied [employer] with any of your insurance information on file for billing purposes. [employer] offers a sliding scale Patient Assistance Program for patients that do not have insurance, or whose insurance does not completely cover the cost of the ZIO monitor.   You must apply for the Patient Assistance Program to qualify for this discounted rate.     To apply, please call [employer] at [phone], select option 4, then select option 2, and ask to apply for Patient Assistance Program.  Theodore Demark will ask your household income, and how many people are in your household.  They will quote your out-of-pocket cost based on that information.  [employer] will also be able to set up a 61-month, interest-free payment plan if needed.  Applying the monitor   Shave hair from  upper left chest.  Hold abrader disc by orange tab. Rub abrader in 40 strokes over the upper left chest as indicated in your monitor instructions.  Clean area with 4 enclosed alcohol pads. Let dry.  Apply patch as indicated in monitor instructions. Patch will be placed under collarbone on left side of chest with arrow pointing upward.  Rub patch adhesive wings for 2 minutes. Remove white label marked "1". Remove the white label marked "2". Rub patch adhesive wings for 2 additional minutes.  While looking in a mirror, press and release button in center of patch. A small green light will flash 3-4 times. This will be your only indicator that the monitor has been turned on. ?  Do not shower for the first 24 hours. You may shower after the first 24 hours.  Press the button if you feel a symptom. You will hear a small click. Record Date, Time and Symptom in the Patient Logbook.  When you are ready to remove the patch, follow instructions on the last 2 pages of the Patient Logbook. Stick patch monitor onto the last page of Patient Logbook.  Place Patient Logbook in the blue and white box.  Use locking tab on box and tape box closed securely.  The blue and white box has prepaid postage on it. Please place it in the mailbox as soon as possible. Your physician should have your test results approximately 7 days after the monitor has been mailed back to [facility].  Call [place] at [phone] if you have questions regarding  your ZIO XT patch monitor. Call them immediately if you see an orange light blinking on your monitor.  If your monitor falls off in less than 4 days, contact our Monitor department at [phone]. ?If your monitor becomes loose or falls off after 4 days call [employer] at [phone] for suggestions on securing your monitor.?   Follow-Up:  Follow up appointment with Dr. [name] - Monday July 25 @ 9:45am  Follow up with your pulmonologist within the month.    Referral to psychiatry has been sent to:   [facility] [phone]  Any Other Special Instructions Will Be Listed Below (If Applicable).     If you need a refill on your cardiac medications before your next appointment, please call your pharmacy.

## 2021-02-04 ENCOUNTER — Ambulatory Visit: Payer: Medicare Other

## 2021-02-04 ENCOUNTER — Telehealth: Payer: Self-pay | Admitting: Cardiovascular Disease

## 2021-02-04 NOTE — Telephone Encounter (Signed)
Patient called in asking for a new referral to be sent for her because that office that Dr Cathie [name] sent her they cant see her until December. Patient doesn't want to wait that long. Please advice

## 2021-02-04 NOTE — Telephone Encounter (Signed)
RN returned call to patient who states that Dr. Starleen Arms is not seeing new patients until Dec. 31st. Patient was hoping to get in much sooner than that and would like to know if Ermalinda Barrios has any alternative suggestions on who to see. Please advise.

## 2021-02-09 NOTE — Telephone Encounter (Signed)
Can you let patient know I don't have any other psychiatrist to recommend. We can try going through [facility] to refer her to one. Can you look into that? Also waiting for Dr. [name] to see if he has any further recommendations.

## 2021-02-26 NOTE — Telephone Encounter (Signed)
RN left message for patient regarding referral to Dr. Starleen Arms office. RN advised Dr. [name] was unaware of any other offices at this time. Referral was sent to Dr. Starleen Arms office for first available appointment.

## 2021-03-29 IMAGING — DX DG CHEST 2V
2 series · 2 of 2 positions shown · non-contrast
Comparison: 05/23/2019

CLINICAL DATA: Chest pain

EXAM:
CHEST - 2 VIEW

[chest pa]
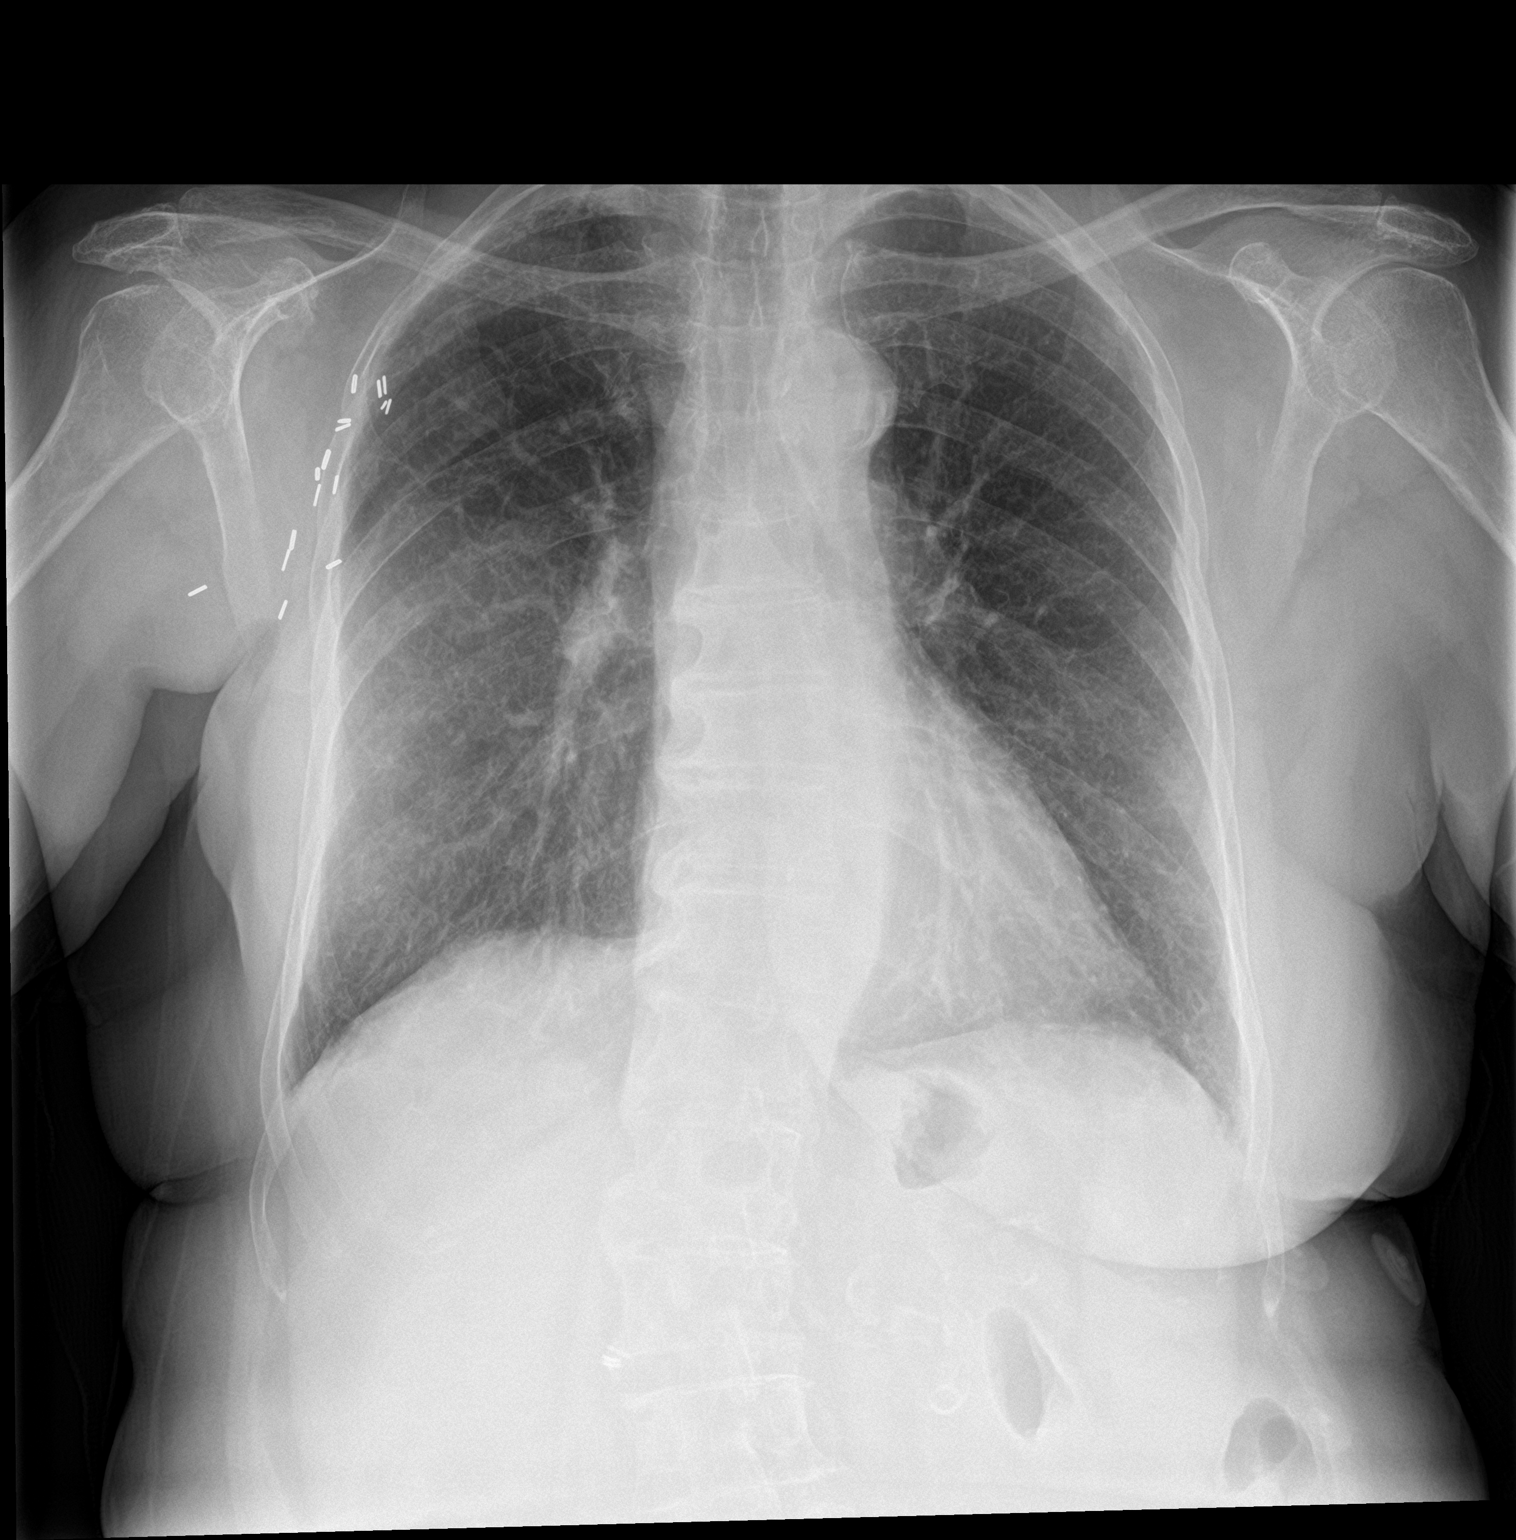

[chest lat]
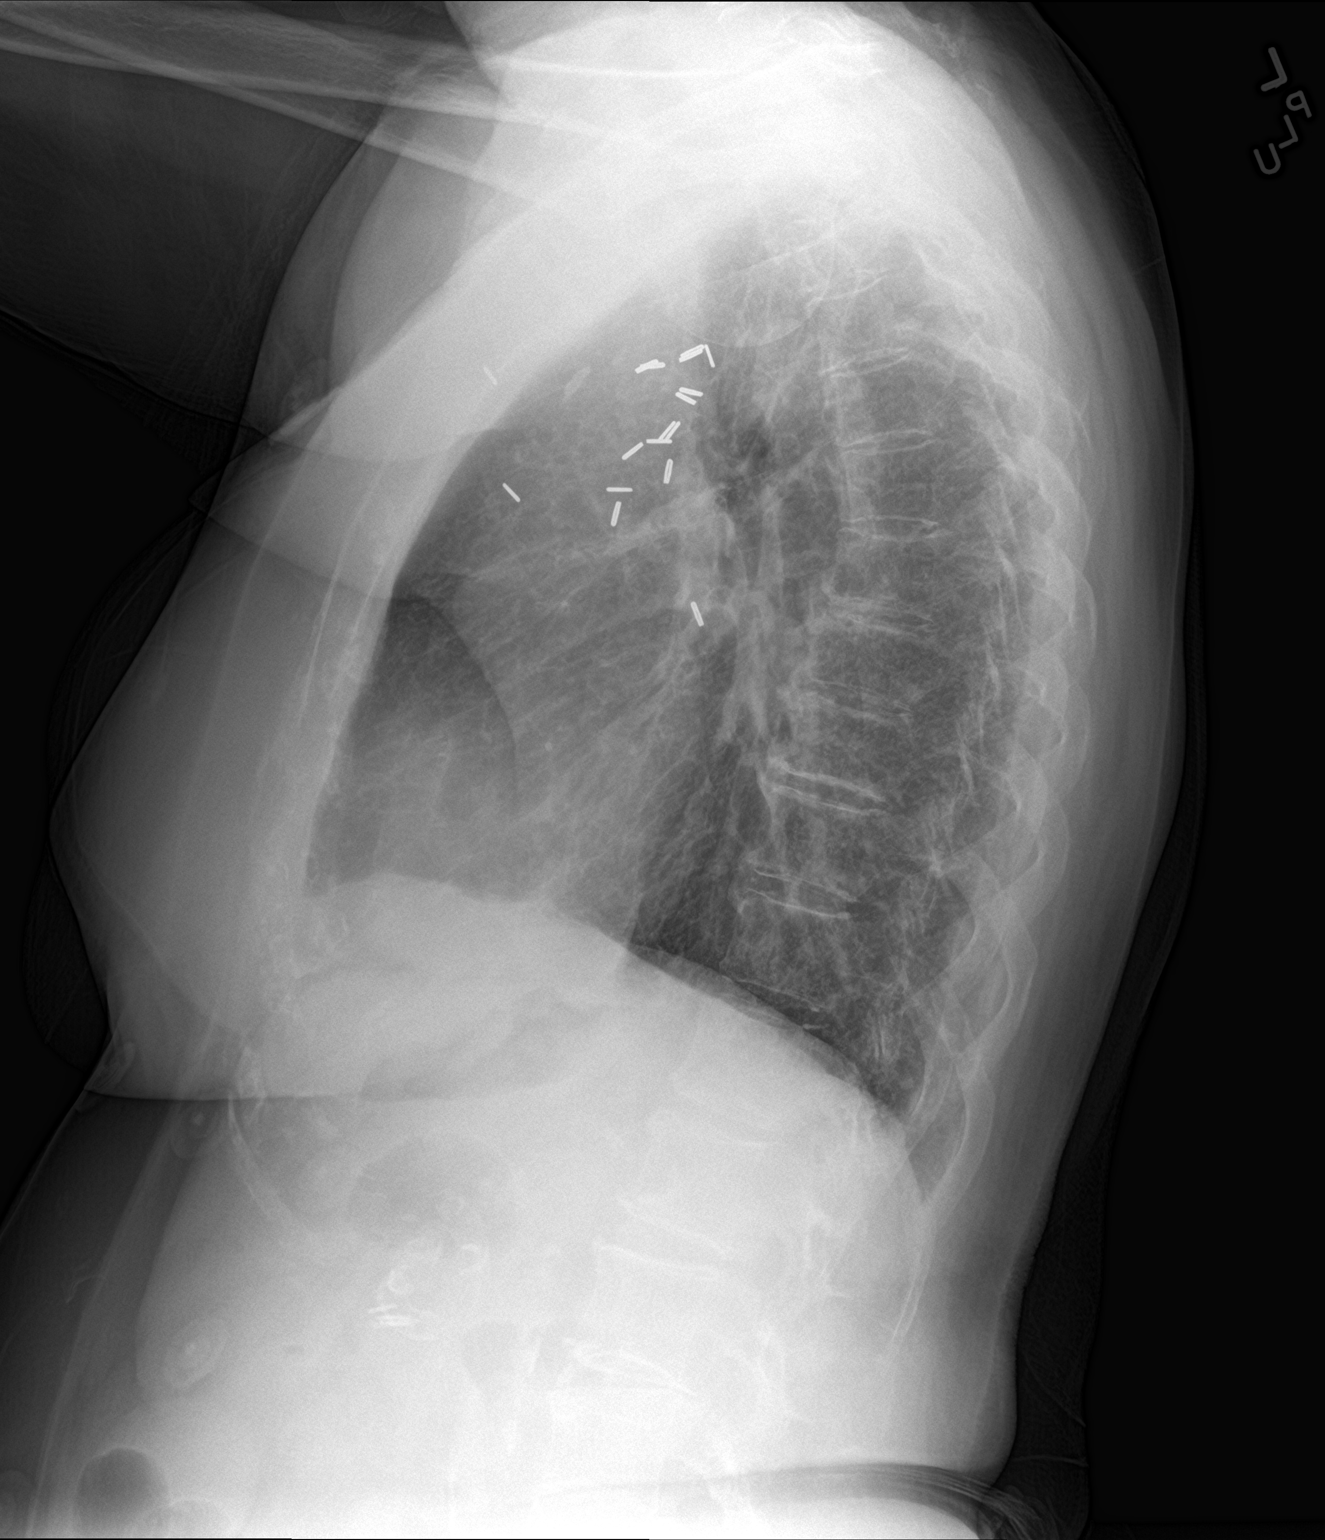

[2 of 2 positions shown; findings below may reference images not displayed]

FINDINGS: Aortic calcifications are noted. There is no pneumothorax. No large
pleural effusion. Chronic bronchitic changes are noted bilaterally.
There is no acute osseous abnormality. There are old healed
left-sided rib fractures.
IMPRESSION: No active cardiopulmonary disease.

## 2021-04-04 ENCOUNTER — Encounter: Payer: Self-pay | Admitting: Cardiovascular Disease

## 2021-04-04 NOTE — Progress Notes (Signed)
This encounter was created in error - please disregard.

## 2021-04-05 ENCOUNTER — Encounter: Payer: Medicare Other | Admitting: Cardiovascular Disease

## 2021-04-06 DIAGNOSIS — M1812 Unilateral primary osteoarthritis of first carpometacarpal joint, left hand: Secondary | ICD-10-CM | POA: Diagnosis not present

## 2021-04-15 DIAGNOSIS — L820 Inflamed seborrheic keratosis: Secondary | ICD-10-CM | POA: Diagnosis not present

## 2021-05-05 ENCOUNTER — Other Ambulatory Visit: Payer: Self-pay | Admitting: Family Medicine

## 2021-05-05 DIAGNOSIS — E2839 Other primary ovarian failure: Secondary | ICD-10-CM

## 2021-05-05 DIAGNOSIS — K2100 Gastro-esophageal reflux disease with esophagitis, without bleeding: Secondary | ICD-10-CM | POA: Diagnosis not present

## 2021-05-05 DIAGNOSIS — R300 Dysuria: Secondary | ICD-10-CM | POA: Diagnosis not present

## 2021-05-05 DIAGNOSIS — Z0000 Encounter for general adult medical examination without abnormal findings: Secondary | ICD-10-CM | POA: Diagnosis not present

## 2021-05-05 DIAGNOSIS — F411 Generalized anxiety disorder: Secondary | ICD-10-CM | POA: Diagnosis not present

## 2021-05-05 DIAGNOSIS — N3000 Acute cystitis without hematuria: Secondary | ICD-10-CM | POA: Diagnosis not present

## 2021-05-05 DIAGNOSIS — I700 Atherosclerosis of aorta: Secondary | ICD-10-CM | POA: Diagnosis not present

## 2021-05-05 DIAGNOSIS — J439 Emphysema, unspecified: Secondary | ICD-10-CM | POA: Diagnosis not present

## 2021-05-05 DIAGNOSIS — I10 Essential (primary) hypertension: Secondary | ICD-10-CM | POA: Diagnosis not present

## 2021-05-08 ENCOUNTER — Ambulatory Visit: Payer: Medicare Other

## 2021-05-08 ENCOUNTER — Ambulatory Visit
Admission: RE | Admit: 2021-05-08 | Discharge: 2021-05-08 | Disposition: A | Discharge status: Home / Self Care | Payer: Medicare Other | Source: Ambulatory Visit | Attending: Family Medicine | Admitting: Family Medicine

## 2021-05-08 DIAGNOSIS — Z1231 Encounter for screening mammogram for malignant neoplasm of breast: Secondary | ICD-10-CM

## 2021-05-21 DIAGNOSIS — Z03818 Encounter for observation for suspected exposure to other biological agents ruled out: Secondary | ICD-10-CM | POA: Diagnosis not present

## 2021-05-21 DIAGNOSIS — J209 Acute bronchitis, unspecified: Secondary | ICD-10-CM | POA: Diagnosis not present

## 2021-05-21 DIAGNOSIS — R0981 Nasal congestion: Secondary | ICD-10-CM | POA: Diagnosis not present

## 2021-05-21 DIAGNOSIS — Z20822 Contact with and (suspected) exposure to covid-19: Secondary | ICD-10-CM | POA: Diagnosis not present

## 2021-05-25 ENCOUNTER — Other Ambulatory Visit: Payer: Self-pay

## 2021-05-25 ENCOUNTER — Inpatient Hospital Stay (HOSPITAL_BASED_OUTPATIENT_CLINIC_OR_DEPARTMENT_OTHER)
Admission: EM | Admit: 2021-05-25 | Discharge: 2021-05-30 | DRG: 202 | Disposition: A | Discharge status: Home / Self Care | Payer: Medicare Other | Attending: Internal Medicine | Admitting: Internal Medicine

## 2021-05-25 ENCOUNTER — Emergency Department (HOSPITAL_BASED_OUTPATIENT_CLINIC_OR_DEPARTMENT_OTHER): Payer: Medicare Other

## 2021-05-25 ENCOUNTER — Encounter (HOSPITAL_BASED_OUTPATIENT_CLINIC_OR_DEPARTMENT_OTHER): Payer: Self-pay | Admitting: Emergency Medicine

## 2021-05-25 DIAGNOSIS — Z8673 Personal history of transient ischemic attack (TIA), and cerebral infarction without residual deficits: Secondary | ICD-10-CM

## 2021-05-25 DIAGNOSIS — T380X5A Adverse effect of glucocorticoids and synthetic analogues, initial encounter: Secondary | ICD-10-CM | POA: Diagnosis present

## 2021-05-25 DIAGNOSIS — R0602 Shortness of breath: Secondary | ICD-10-CM

## 2021-05-25 DIAGNOSIS — J4521 Mild intermittent asthma with (acute) exacerbation: Secondary | ICD-10-CM

## 2021-05-25 DIAGNOSIS — Z20822 Contact with and (suspected) exposure to covid-19: Secondary | ICD-10-CM | POA: Diagnosis not present

## 2021-05-25 DIAGNOSIS — J40 Bronchitis, not specified as acute or chronic: Secondary | ICD-10-CM | POA: Diagnosis present

## 2021-05-25 DIAGNOSIS — J189 Pneumonia, unspecified organism: Secondary | ICD-10-CM | POA: Diagnosis not present

## 2021-05-25 DIAGNOSIS — Z9011 Acquired absence of right breast and nipple: Secondary | ICD-10-CM | POA: Diagnosis not present

## 2021-05-25 DIAGNOSIS — Z79899 Other long term (current) drug therapy: Secondary | ICD-10-CM

## 2021-05-25 DIAGNOSIS — N179 Acute kidney failure, unspecified: Secondary | ICD-10-CM | POA: Diagnosis present

## 2021-05-25 DIAGNOSIS — Z885 Allergy status to narcotic agent status: Secondary | ICD-10-CM | POA: Diagnosis not present

## 2021-05-25 DIAGNOSIS — Z923 Personal history of irradiation: Secondary | ICD-10-CM | POA: Diagnosis not present

## 2021-05-25 DIAGNOSIS — R0982 Postnasal drip: Secondary | ICD-10-CM

## 2021-05-25 DIAGNOSIS — F32A Depression, unspecified: Secondary | ICD-10-CM | POA: Diagnosis present

## 2021-05-25 DIAGNOSIS — Z8249 Family history of ischemic heart disease and other diseases of the circulatory system: Secondary | ICD-10-CM

## 2021-05-25 DIAGNOSIS — Z825 Family history of asthma and other chronic lower respiratory diseases: Secondary | ICD-10-CM

## 2021-05-25 DIAGNOSIS — I152 Hypertension secondary to endocrine disorders: Secondary | ICD-10-CM | POA: Diagnosis present

## 2021-05-25 DIAGNOSIS — I5033 Acute on chronic diastolic (congestive) heart failure: Secondary | ICD-10-CM | POA: Diagnosis not present

## 2021-05-25 DIAGNOSIS — J9601 Acute respiratory failure with hypoxia: Secondary | ICD-10-CM | POA: Diagnosis not present

## 2021-05-25 DIAGNOSIS — E7800 Pure hypercholesterolemia, unspecified: Secondary | ICD-10-CM | POA: Diagnosis not present

## 2021-05-25 DIAGNOSIS — K279 Peptic ulcer, site unspecified, unspecified as acute or chronic, without hemorrhage or perforation: Secondary | ICD-10-CM | POA: Diagnosis not present

## 2021-05-25 DIAGNOSIS — I639 Cerebral infarction, unspecified: Secondary | ICD-10-CM | POA: Diagnosis present

## 2021-05-25 DIAGNOSIS — R0682 Tachypnea, not elsewhere classified: Secondary | ICD-10-CM

## 2021-05-25 DIAGNOSIS — R058 Other specified cough: Secondary | ICD-10-CM | POA: Diagnosis present

## 2021-05-25 DIAGNOSIS — Z853 Personal history of malignant neoplasm of breast: Secondary | ICD-10-CM

## 2021-05-25 DIAGNOSIS — I110 Hypertensive heart disease with heart failure: Secondary | ICD-10-CM | POA: Diagnosis not present

## 2021-05-25 DIAGNOSIS — E876 Hypokalemia: Secondary | ICD-10-CM | POA: Diagnosis present

## 2021-05-25 DIAGNOSIS — Z9221 Personal history of antineoplastic chemotherapy: Secondary | ICD-10-CM | POA: Diagnosis not present

## 2021-05-25 DIAGNOSIS — J209 Acute bronchitis, unspecified: Secondary | ICD-10-CM | POA: Diagnosis not present

## 2021-05-25 DIAGNOSIS — Z886 Allergy status to analgesic agent status: Secondary | ICD-10-CM | POA: Diagnosis not present

## 2021-05-25 DIAGNOSIS — J45901 Unspecified asthma with (acute) exacerbation: Principal | ICD-10-CM | POA: Diagnosis present

## 2021-05-25 DIAGNOSIS — R739 Hyperglycemia, unspecified: Secondary | ICD-10-CM | POA: Diagnosis present

## 2021-05-25 DIAGNOSIS — K219 Gastro-esophageal reflux disease without esophagitis: Secondary | ICD-10-CM | POA: Diagnosis not present

## 2021-05-25 DIAGNOSIS — Z7902 Long term (current) use of antithrombotics/antiplatelets: Secondary | ICD-10-CM | POA: Diagnosis not present

## 2021-05-25 DIAGNOSIS — Z881 Allergy status to other antibiotic agents status: Secondary | ICD-10-CM | POA: Diagnosis not present

## 2021-05-25 DIAGNOSIS — I10 Essential (primary) hypertension: Secondary | ICD-10-CM | POA: Diagnosis not present

## 2021-05-25 DIAGNOSIS — F419 Anxiety disorder, unspecified: Secondary | ICD-10-CM | POA: Diagnosis present

## 2021-05-25 DIAGNOSIS — E1159 Type 2 diabetes mellitus with other circulatory complications: Secondary | ICD-10-CM | POA: Diagnosis present

## 2021-05-25 DIAGNOSIS — E785 Hyperlipidemia, unspecified: Secondary | ICD-10-CM | POA: Diagnosis present

## 2021-05-25 DIAGNOSIS — Z7951 Long term (current) use of inhaled steroids: Secondary | ICD-10-CM

## 2021-05-25 LAB — CBC WITH DIFFERENTIAL/PLATELET
Abs Immature Granulocytes: 0.15 10*3/uL — ABNORMAL HIGH (ref 0.00–0.07)
Basophils Absolute: 0.1 10*3/uL (ref 0.0–0.1)
Basophils Relative: 1 %
Eosinophils Absolute: 0.1 10*3/uL (ref 0.0–0.5)
Eosinophils Relative: 1 %
HCT: 36.3 % (ref 36.0–46.0)
Hemoglobin: 12.9 g/dL (ref 12.0–15.0)
Immature Granulocytes: 1 %
Lymphocytes Relative: 36 %
Lymphs Abs: 3.9 10*3/uL (ref 0.7–4.0)
MCH: 30.3 pg (ref 26.0–34.0)
MCHC: 35.5 g/dL (ref 30.0–36.0)
MCV: 85.2 fL (ref 80.0–100.0)
Monocytes Absolute: 0.9 10*3/uL (ref 0.1–1.0)
Monocytes Relative: 8 %
Neutro Abs: 5.7 10*3/uL (ref 1.7–7.7)
Neutrophils Relative %: 53 %
Platelets: 254 10*3/uL (ref 150–400)
RBC: 4.26 MIL/uL (ref 3.87–5.11)
RDW: 13.5 % (ref 11.5–15.5)
WBC: 10.7 10*3/uL — ABNORMAL HIGH (ref 4.0–10.5)
nRBC: 0.2 % (ref 0.0–0.2)

## 2021-05-25 LAB — BASIC METABOLIC PANEL
Anion gap: 12 (ref 5–15)
BUN: 18 mg/dL (ref 8–23)
CO2: 25 mmol/L (ref 22–32)
Calcium: 9.3 mg/dL (ref 8.9–10.3)
Chloride: 98 mmol/L (ref 98–111)
Creatinine, Ser: 0.87 mg/dL (ref 0.44–1.00)
GFR, Estimated: >60 mL/min (ref >60)
Glucose, Bld: 326 mg/dL — ABNORMAL HIGH (ref 70–99)
Potassium: 2.9 mmol/L — ABNORMAL LOW (ref 3.5–5.1)
Sodium: 135 mmol/L (ref 135–145)

## 2021-05-25 LAB — MAGNESIUM: Magnesium: 1.6 mg/dL — ABNORMAL LOW (ref 1.7–2.4)

## 2021-05-25 LAB — RESP PANEL BY RT-PCR (FLU A&B, COVID) ARPGX2
Influenza A by PCR: NEGATIVE
Influenza B by PCR: NEGATIVE
SARS Coronavirus 2 by RT PCR: NEGATIVE

## 2021-05-25 LAB — GLUCOSE, CAPILLARY: Glucose-Capillary: 269 mg/dL — ABNORMAL HIGH (ref 70–99)

## 2021-05-25 MED ORDER — ACETAMINOPHEN 325 MG PO TABS
650.0000 mg | ORAL_TABLET | Freq: Four times a day (QID) | ORAL | Status: DC | PRN
  Administered 2021-05-25 – 2021-05-29 (×3): 650 mg via ORAL
  Filled 2021-05-25 (×4): qty 2

## 2021-05-25 MED ORDER — ALBUTEROL SULFATE (2.5 MG/3ML) 0.083% IN NEBU
2.5000 mg | INHALATION_SOLUTION | Freq: Once | RESPIRATORY_TRACT | Status: AC
  Administered 2021-05-25: 2.5 mg via RESPIRATORY_TRACT
  Filled 2021-05-25: qty 3

## 2021-05-25 MED ORDER — POTASSIUM CHLORIDE CRYS ER 20 MEQ PO TBCR
40.0000 meq | EXTENDED_RELEASE_TABLET | Freq: Once | ORAL | Status: AC
  Administered 2021-05-25: 40 meq via ORAL
  Filled 2021-05-25: qty 2

## 2021-05-25 MED ORDER — AMLODIPINE BESYLATE 5 MG PO TABS
5.0000 mg | ORAL_TABLET | Freq: Every day | ORAL | Status: DC
  Administered 2021-05-25 – 2021-05-30 (×6): 5 mg via ORAL
  Filled 2021-05-25 (×6): qty 1

## 2021-05-25 MED ORDER — IPRATROPIUM BROMIDE 0.02 % IN SOLN
0.5000 mg | Freq: Four times a day (QID) | RESPIRATORY_TRACT | Status: DC
  Administered 2021-05-25: 0.5 mg via RESPIRATORY_TRACT
  Filled 2021-05-25: qty 2.5

## 2021-05-25 MED ORDER — INSULIN ASPART 100 UNIT/ML IJ SOLN
0.0000 [IU] | Freq: Three times a day (TID) | INTRAMUSCULAR | Status: DC
  Administered 2021-05-26 (×2): 7 [IU] via SUBCUTANEOUS
  Administered 2021-05-26: 3 [IU] via SUBCUTANEOUS
  Administered 2021-05-27: 7 [IU] via SUBCUTANEOUS
  Administered 2021-05-27: 3 [IU] via SUBCUTANEOUS
  Administered 2021-05-27: 2 [IU] via SUBCUTANEOUS
  Administered 2021-05-28 (×2): 3 [IU] via SUBCUTANEOUS
  Administered 2021-05-28: 5 [IU] via SUBCUTANEOUS
  Administered 2021-05-29: 9 [IU] via SUBCUTANEOUS
  Administered 2021-05-29: 5 [IU] via SUBCUTANEOUS
  Administered 2021-05-29 – 2021-05-30 (×2): 7 [IU] via SUBCUTANEOUS
  Administered 2021-05-30: 3 [IU] via SUBCUTANEOUS

## 2021-05-25 MED ORDER — SENNOSIDES-DOCUSATE SODIUM 8.6-50 MG PO TABS: 1.0000 | ORAL_TABLET | Freq: Every evening | ORAL | Status: DC | PRN

## 2021-05-25 MED ORDER — ALBUTEROL SULFATE (2.5 MG/3ML) 0.083% IN NEBU: 2.5000 mg | INHALATION_SOLUTION | RESPIRATORY_TRACT | Status: DC | PRN

## 2021-05-25 MED ORDER — ENOXAPARIN SODIUM 40 MG/0.4ML IJ SOSY
40.0000 mg | PREFILLED_SYRINGE | INTRAMUSCULAR | Status: DC
  Administered 2021-05-25 – 2021-05-29 (×5): 40 mg via SUBCUTANEOUS
  Filled 2021-05-25 (×5): qty 0.4

## 2021-05-25 MED ORDER — METOPROLOL TARTRATE 25 MG PO TABS
25.0000 mg | ORAL_TABLET | Freq: Every day | ORAL | Status: DC
  Administered 2021-05-25: 25 mg via ORAL
  Filled 2021-05-25: qty 1

## 2021-05-25 MED ORDER — ALBUTEROL SULFATE HFA 108 (90 BASE) MCG/ACT IN AERS
2.0000 | INHALATION_SPRAY | RESPIRATORY_TRACT | Status: DC | PRN
  Administered 2021-05-25: 2 via RESPIRATORY_TRACT
  Filled 2021-05-25: qty 6.7

## 2021-05-25 MED ORDER — BISACODYL 5 MG PO TBEC: 5.0000 mg | DELAYED_RELEASE_TABLET | Freq: Every day | ORAL | Status: DC | PRN

## 2021-05-25 MED ORDER — NITROGLYCERIN 0.4 MG SL SUBL: 0.4000 mg | SUBLINGUAL_TABLET | SUBLINGUAL | Status: DC | PRN

## 2021-05-25 MED ORDER — METHYLPREDNISOLONE SODIUM SUCC 125 MG IJ SOLR
60.0000 mg | Freq: Two times a day (BID) | INTRAMUSCULAR | Status: DC
  Administered 2021-05-25 – 2021-05-26 (×2): 60 mg via INTRAVENOUS
  Filled 2021-05-25 (×2): qty 2

## 2021-05-25 MED ORDER — ALPRAZOLAM 0.25 MG PO TABS: 0.2500 mg | ORAL_TABLET | Freq: Two times a day (BID) | ORAL | Status: DC | PRN

## 2021-05-25 MED ORDER — POTASSIUM CHLORIDE CRYS ER 20 MEQ PO TBCR: 40.0000 meq | EXTENDED_RELEASE_TABLET | ORAL | Status: DC

## 2021-05-25 MED ORDER — GUAIFENESIN ER 600 MG PO TB12
600.0000 mg | ORAL_TABLET | Freq: Two times a day (BID) | ORAL | Status: DC
  Administered 2021-05-25 – 2021-05-30 (×10): 600 mg via ORAL
  Filled 2021-05-25 (×10): qty 1

## 2021-05-25 MED ORDER — METHYLPREDNISOLONE SODIUM SUCC 125 MG IJ SOLR
125.0000 mg | Freq: Once | INTRAMUSCULAR | Status: AC
  Administered 2021-05-25: 125 mg via INTRAVENOUS
  Filled 2021-05-25: qty 2

## 2021-05-25 MED ORDER — ALBUTEROL SULFATE (2.5 MG/3ML) 0.083% IN NEBU
5.0000 mg | INHALATION_SOLUTION | Freq: Once | RESPIRATORY_TRACT | Status: AC
  Administered 2021-05-25: 5 mg via RESPIRATORY_TRACT
  Filled 2021-05-25: qty 6

## 2021-05-25 MED ORDER — CLONAZEPAM 1 MG PO TABS: 1.0000 mg | ORAL_TABLET | Freq: Two times a day (BID) | ORAL | Status: DC | PRN

## 2021-05-25 MED ORDER — TRAZODONE HCL 50 MG PO TABS
50.0000 mg | ORAL_TABLET | Freq: Every evening | ORAL | Status: DC | PRN
  Administered 2021-05-26 – 2021-05-29 (×4): 50 mg via ORAL
  Filled 2021-05-25 (×4): qty 1

## 2021-05-25 MED ORDER — IPRATROPIUM-ALBUTEROL 0.5-2.5 (3) MG/3ML IN SOLN
3.0000 mL | Freq: Once | RESPIRATORY_TRACT | Status: AC
  Administered 2021-05-25: 3 mL via RESPIRATORY_TRACT
  Filled 2021-05-25: qty 3

## 2021-05-25 MED ORDER — CLOPIDOGREL BISULFATE 75 MG PO TABS
75.0000 mg | ORAL_TABLET | Freq: Every day | ORAL | Status: DC
  Administered 2021-05-25 – 2021-05-29 (×5): 75 mg via ORAL
  Filled 2021-05-25 (×5): qty 1

## 2021-05-25 MED ORDER — ISOSORBIDE MONONITRATE ER 30 MG PO TB24
30.0000 mg | ORAL_TABLET | Freq: Every day | ORAL | Status: DC
  Administered 2021-05-26 – 2021-05-30 (×5): 30 mg via ORAL
  Filled 2021-05-25 (×6): qty 1

## 2021-05-25 MED ORDER — DULOXETINE HCL 30 MG PO CPEP
30.0000 mg | ORAL_CAPSULE | Freq: Every day | ORAL | Status: DC
  Administered 2021-05-25 – 2021-05-30 (×6): 30 mg via ORAL
  Filled 2021-05-25 (×6): qty 1

## 2021-05-25 MED ORDER — METHYLPREDNISOLONE SODIUM SUCC 125 MG IJ SOLR: 120.0000 mg | INTRAMUSCULAR | Status: DC

## 2021-05-25 MED ORDER — LORATADINE 10 MG PO TABS
10.0000 mg | ORAL_TABLET | Freq: Every day | ORAL | Status: DC
  Administered 2021-05-25 – 2021-05-30 (×6): 10 mg via ORAL
  Filled 2021-05-25 (×6): qty 1

## 2021-05-25 MED ORDER — MAGNESIUM SULFATE 50 % IJ SOLN
1.0000 g | Freq: Once | INTRAMUSCULAR | Status: DC
  Filled 2021-05-25: qty 2

## 2021-05-25 MED ORDER — ATORVASTATIN CALCIUM 80 MG PO TABS
80.0000 mg | ORAL_TABLET | Freq: Every day | ORAL | Status: DC
  Administered 2021-05-25 – 2021-05-29 (×5): 80 mg via ORAL
  Filled 2021-05-25 (×5): qty 1

## 2021-05-25 MED ORDER — MOMETASONE FURO-FORMOTEROL FUM 200-5 MCG/ACT IN AERO
2.0000 | INHALATION_SPRAY | Freq: Two times a day (BID) | RESPIRATORY_TRACT | Status: DC
  Administered 2021-05-25 – 2021-05-30 (×9): 2 via RESPIRATORY_TRACT
  Filled 2021-05-25: qty 8.8

## 2021-05-25 MED ORDER — HYDROCOD POLST-CPM POLST ER 10-8 MG/5ML PO SUER
5.0000 mL | Freq: Every evening | ORAL | Status: DC | PRN
  Administered 2021-05-25 – 2021-05-27 (×3): 5 mL via ORAL
  Filled 2021-05-25 (×4): qty 5

## 2021-05-25 MED ORDER — ACETAMINOPHEN 650 MG RE SUPP: 650.0000 mg | Freq: Four times a day (QID) | RECTAL | Status: DC | PRN

## 2021-05-25 MED ORDER — MAGNESIUM SULFATE IN D5W 1-5 GM/100ML-% IV SOLN
1.0000 g | Freq: Once | INTRAVENOUS | Status: AC
  Administered 2021-05-25: 1 g via INTRAVENOUS
  Filled 2021-05-25: qty 100

## 2021-05-25 NOTE — Progress Notes (Signed)
Pt received to unit via [facility], pt alert and oriented x4, history of right mastectomy, pink armband placed.

## 2021-05-25 NOTE — ED Notes (Signed)
Pt states that she has had 2 covid tests  in the last week and last was send out it , they were all neg she states

## 2021-05-25 NOTE — H&P (Addendum)
History and Physical    [name] [id number] DOB: [birth year] DOA: 05/25/2021  PCP: Alroy Dust, L.Marlou Sa, MD  Patient coming from: home  I have personally briefly reviewed patient's old medical records in [place]  Chief Complaint: SOB and cough  HPI: [name] is a 80 y.o. female with medical history significant of asthma, HTN, Hx of CVA, PUD, GERD, breast cancer who presented to [facility] with persistent shortness of breath, cough and wheezing.  She had been treated as outpatient for bronchitis with a Z-pack and prednisone, but failed to improve.  Denies fevers/chills.  Cough is occasionally productive.  States she gets bronchitis a couple of times each year, but typically it does not cause any issues with her asthma.  She reports some sinus congestion and rhinorrhea, and rib cage pain due to coughing.  Poor sleep due to cough keeping her up.  Otherwise she reports recently being in good health.  Denies abdominal pain, N/V/D, urinary symptoms, headaches, focal weakness numbness or tingling or any other recent symptoms or illnesses.    In the ED at [facility], afebrile, tachypneic, BP elevated 163/80.  Labs notable for K 2.9 and glucose 326.  No prior history of diabetes, has been on prednisone.  Chest xray negative.  Patient was not hypoxic but despite IV steroids and nebs given in the ED, patient remained extremely dyspneic on minimal exertion.  Admitted to [facility] for observation and further management of acute asthma exacerbation.    Review of Systems: As per HPI otherwise 10 point review of systems negative.    Past Medical History:  Diagnosis Date   Asthma    Breast CA ([place])    Cancer ([place])    right breast mastectomy   DDD (degenerative disc disease), cervical    H/O chest pain 03/24/2006   normal nuclear  study   Headache    High cholesterol    Hypertension    Mild aortic insufficiency 03/28/2006   echo   Normal cardiac stress test 2005   Personal history of chemotherapy     Personal history of radiation therapy    PONV (postoperative nausea and vomiting)    Sinus bradycardia on ECG    Stroke [facility])     Past Surgical History:  Procedure Laterality Date   APPENDECTOMY     "30 years ago"   BREAST SURGERY     childbirth     x 2   CHOLECYSTECTOMY     "15 years ago"   ESOPHAGOGASTRODUODENOSCOPY N/A 03/04/2014   Procedure: ESOPHAGOGASTRODUODENOSCOPY (EGD);  Surgeon: [name], MD;  Location[facility] ENDOSCOPY;  Service: Endoscopy;  Laterality: N/A;   MASTECTOMY  1993   right breast   OTHER SURGICAL HISTORY     hysterectomy   rectal fissure     RIGHT/LEFT HEART CATH AND CORONARY ANGIOGRAPHY N/A 05/30/2019   Procedure: RIGHT/LEFT HEART CATH AND CORONARY ANGIOGRAPHY;  Surgeon: [name], MD;  Location: [facility];  Service: Cardiovascular;  Laterality: N/A;   TUBAL LIGATION       reports that she has never smoked. She has never used smokeless tobacco. She reports that she does not drink alcohol and does not use drugs.  Allergies  Allergen Reactions   Codeine Nausea And Vomiting   Doxycycline Nausea And Vomiting    REACTION: GI upset   Percocet [Oxycodone-Acetaminophen] Nausea And Vomiting   Tramadol     Other reaction(s): Other (See Comments)    Family History  Problem  Relation Age of Onset   Heart attack Mother    Heart disease Mother    Hypertension Mother    Asthma Mother    Heart attack Father    Heart disease Father    Hypertension Sister    Heart attack Sister    Cancer Sister 37       colon   Hypertension Sister    Heart attack Sister      Prior to Admission medications   Medication Sig Start Date End Date Taking? Authorizing Provider  albuterol (VENTOLIN HFA) 108 (90 Base) MCG/ACT inhaler Inhale 1-2 puffs into the lungs every 4 (four) hours as needed for wheezing or shortness of breath.    [provider]  amLODipine (NORVASC) 5 MG tablet Take 5 mg by mouth daily.    [provider]   atorvastatin (LIPITOR) 80 MG tablet Take 1 tablet (80 mg total) by mouth daily at 6 PM. 07/06/15   Rosalin Hawking, MD  clonazePAM (KLONOPIN) 1 MG tablet Take 1 mg by mouth 2 (two) times daily as needed. 08/24/20   [provider]  clopidogrel (PLAVIX) 75 MG tablet Take 1 tablet (75 mg total) by mouth daily. 06/01/17   Rosalin Hawking, MD  colchicine 0.6 MG tablet TAKE 1 TABLET (0.6 MG TOTAL) BY MOUTH DAILY. TAKE AT ONSET OF GOUT ATTACK DAILY FOR 3-5 DAYS 01/21/21   Bronson Ing, DPM  Fluticasone-Salmeterol (ADVAIR) 250-50 MCG/DOSE AEPB Inhale 1 puff into the lungs 2 (two) times daily.    [provider]  isosorbide mononitrate (IMDUR) 30 MG 24 hr tablet TAKE 1 TABLET BY MOUTH  DAILY 01/14/21   [name], Wonda Cheng, MD  loratadine (CLARITIN) 10 MG tablet Take 10 mg by mouth daily as needed for allergies or rhinitis.     [provider]  metoprolol tartrate (LOPRESSOR) 25 MG tablet TAKE 1 TABLET BY MOUTH  TWICE DAILY 01/14/21   [name], Wonda Cheng, MD  montelukast (SINGULAIR) 10 MG tablet Take 1 tablet (10 mg total) by mouth at bedtime. 09/16/19   Spero Geralds, MD  nitroGLYCERIN (NITROSTAT) 0.4 MG SL tablet Place 1 tablet (0.4 mg total) under the tongue every 5 (five) minutes as needed for chest pain. 05/07/19 02/12/20  [name], Wonda Cheng, MD  nortriptyline (PAMELOR) 10 MG capsule Take 2 capsules (20 mg total) by mouth at bedtime. 11/29/17   Rosalin Hawking, MD  pantoprazole (PROTONIX) 40 MG tablet Take 40 mg by mouth daily as needed (Acid Reflux).  06/05/17   [provider]  sertraline (ZOLOFT) 100 MG tablet Take 100 mg by mouth daily. 10/06/20   [provider]    Physical Exam: Vitals:   05/25/21 1400 05/25/21 1430 05/25/21 1500 05/25/21 1653  BP: (!) 115/50 (!) 163/67 (!) 173/71 (!) 161/77  Pulse: 83 76 71 78  Resp: (!) 27 (!) 25 (!) 21   Temp:      TempSrc:      SpO2: 94% 96% 92%   Weight:    71.9 kg  Height:    '5\' 5"'$  (1.651 m)     Constitutional: NAD, calm,  comfortable, seated edge of bed Eyes: PERRL, lids and conjunctivae normal ENMT: Mucous membranes are moist. Posterior pharynx clear of any exudate or lesions.Normal dentition.  Neck: normal, supple, no masses, no thyromegaly Respiratory: Diffuse high-pitched expiratory wheezes. Mildly increased respiratory effort at rest. No accessory muscle use.  Cardiovascular: RRR, no murmurs / rubs / gallops. No extremity edema. 2+ pedal pulses. No carotid  bruits.  Abdomen: soft, NT, ND, no masses or HSM palpated. +Bowel sounds.  Musculoskeletal: no clubbing / cyanosis. No joint deformity upper and lower extremities. Normal muscle tone.  Skin: dry, intact, normal color, normal temperature Neurologic: CN 2-12 grossly intact. Normal speech.  Grossly non-focal exam. Psychiatric: Alert and oriented x 3. Normal mood. Congruent affect.  Normal judgement and insight.    Labs on Admission: I have personally reviewed following labs and imaging studies  CBC: Recent Labs  Lab 05/25/21 0759  WBC 10.7*  NEUTROABS 5.7  HGB 12.9  HCT 36.3  MCV 85.2  PLT 0000000   Basic Metabolic Panel: Recent Labs  Lab 05/25/21 0759  NA 135  K 2.9*  CL 98  CO2 25  GLUCOSE 326*  BUN 18  CREATININE 0.87  CALCIUM 9.3  MG 1.6*   GFR: Estimated Creatinine Clearance: 50.4 mL/min (by C-G formula based on SCr of 0.87 mg/dL). Liver Function Tests: No results for input(s): AST, ALT, ALKPHOS, BILITOT, PROT, ALBUMIN in the last 168 hours. No results for input(s): LIPASE, AMYLASE in the last 168 hours. No results for input(s): AMMONIA in the last 168 hours. Coagulation Profile: No results for input(s): INR, PROTIME in the last 168 hours. Cardiac Enzymes: No results for input(s): CKTOTAL, CKMB, CKMBINDEX, TROPONINI in the last 168 hours. BNP (last 3 results) No results for input(s): PROBNP in the last 8760 hours. HbA1C: No results for input(s): HGBA1C in the last 72 hours. CBG: No results for input(s): GLUCAP in the last  168 hours. Lipid Profile: No results for input(s): CHOL, HDL, LDLCALC, TRIG, CHOLHDL, LDLDIRECT in the last 72 hours. Thyroid Function Tests: No results for input(s): TSH, T4TOTAL, FREET4, T3FREE, THYROIDAB in the last 72 hours. Anemia Panel: No results for input(s): VITAMINB12, FOLATE, FERRITIN, TIBC, IRON, RETICCTPCT in the last 72 hours. Urine analysis:    Component Value Date/Time   COLORURINE YELLOW 06/15/2017 2210   APPEARANCEUR HAZY (A) 06/15/2017 2210   LABSPEC 1.013 06/15/2017 2210   PHURINE 6.0 06/15/2017 2210   GLUCOSEU NEGATIVE 06/15/2017 2210   HGBUR MODERATE (A) 06/15/2017 2210   BILIRUBINUR NEGATIVE 06/15/2017 2210   KETONESUR NEGATIVE 06/15/2017 2210   PROTEINUR 30 (A) 06/15/2017 2210   UROBILINOGEN 0.2 06/30/2014 1930   NITRITE NEGATIVE 06/15/2017 2210   LEUKOCYTESUR MODERATE (A) 06/15/2017 2210    Radiological Exams on Admission: DG Chest Port 1 View  Result Date: 05/25/2021 CLINICAL DATA:  Shortness of breath EXAM: PORTABLE CHEST 1 VIEW COMPARISON:  11/19/2020 FINDINGS: Normal heart size and mediastinal contours. Reticulation of lung markings which is chronic and greatest at the right apex where there is some degree of presumed radiation fibrosis. Postoperative right breast and axilla. No acute osseous finding. IMPRESSION: Stable chest.  No acute finding. Electronically Signed   By: Jorje Guild M.D.   On: 05/25/2021 07:47    EKG: Independently reviewed. No acute ischemic changes.  Assessment/Plan Principal Problem:   Asthma exacerbation Active Problems:   Bronchitis   G E R D   Upper airway cough syndrome   PUD (peptic ulcer disease)   HTN (hypertension)   CVA (cerebral vascular accident) ([place])   HLD (hyperlipidemia)     Asthma exacerbation secondary to Acute Bronchitis - POA. No hypoxia, but on admission encounter, spo2 low 90's on room air.  Treat with IV steroids, schedule and PRN bronchodilators, antitussives.  Oxygen is spO2 < 90% on room air.   Dulera (sub for home Advair).  Hypokalemia - K 2.9 on admission,  being replaced. Monitor BMP.  Steroid-induced Hyperglycemia - has been on Prednisone.   No hx of diabetes.  Sliding scale Novolog.  Check A1c.  Upper airway cough syndrome - continue loratadine  Hypertension - continue home amlodipine, metoprolol, Imdur  Hx of CVA - continue Lipitor and Plavix  Hyperlipidemia - Lipitor  Depression/Anxiety - continue home Cymbalta and Klonopin PRN  GERD and PUD - appears not currently on PPI or H2 blocker   DVT prophylaxis: enoxaparin (LOVENOX) injection 40 mg Start: 05/25/21 1800   Code Status: Full  Family Communication: husband and neighbor at bedside Disposition Plan: anticipate d/c home in 24-48 hrs  Consults called: none   Admission status:  Status is: Observation  The patient remains OBS appropriate and will d/c before 2 midnights.  Dispo: The patient is from: Home              Anticipated d/c is to: Home              Patient currently is not medically stable to d/c.   Difficult to place patient No         Ezekiel Slocumb, DO [facility]  05/25/2021, 7:30 PM    If 7PM-7AM, please contact night-coverage. How to contact the [facility] Attending or Consulting provider [place] or covering provider during after hours [place], for this patient?    Check the care team in [facility] and look for a) attending/consulting [facility] provider listed and b) the [facility] team listed Log into [email or web address] and use [place]'s universal password to access. If you do not have the password, please contact the hospital operator. Locate the [facility] provider you are looking for under [facility] and page to a number that you can be directly reached. If you still have difficulty reaching the provider, please page the [facility] (Director on Call) for the Hospitalists listed on amion for assistance.

## 2021-05-25 NOTE — ED Notes (Signed)
Pt eating meatloaf dinner [employer] , husband at bedside , pt up to void before she eats

## 2021-05-25 NOTE — ED Triage Notes (Signed)
Pt states her nose was running on Thursday on [place]  Pt was [facility] at [facility]  Pt was given covid tests there and pt was given a z pack, prednisone and mucinex   Pt states she has not had any relief  Pt states she has been wheezing and feels short of breath  Pt has been coughing  Pt states her ribs and chest are sore from coughing

## 2021-05-25 NOTE — ED Notes (Signed)
Spoke to her daughter and let her know where pt was going

## 2021-05-25 NOTE — ED Notes (Signed)
[facility] here to get pt 

## 2021-05-25 NOTE — Plan of Care (Signed)

## 2021-05-25 NOTE — ED Provider Notes (Signed)
[facility] Provider Note   CSN: [id number] Arrival date & time: 05/25/21  [id number]     History Chief Complaint  Patient presents with   Shortness of Breath    [name] is a 80 y.o. female.  HPI  80 year old female with past medical history of remote breast cancer status postmastectomy, asthma, HTN, HLD, previous CVA presents the emergency department concern for cough and shortness of breath.  Patient states last week she developed nasal congestion and shortness of breath.  [facility], given antibiotic and steroids.  She was about to complete this regimen has had no improvement.  She presents today with worsening shortness of breath, productive cough and fatigue.  Denies any documented fever but endorses chills and decreased appetite.  No GI symptoms.  No swelling of her lower extremities.  Past Medical History:  Diagnosis Date   Asthma    Breast CA ([place])    Cancer ([place])    right breast mastectomy   DDD (degenerative disc disease), cervical    H/O chest pain 03/24/2006   normal nuclear  study   Headache    High cholesterol    Hypertension    Mild aortic insufficiency 03/28/2006   echo   Normal cardiac stress test 2005   Personal history of chemotherapy    Personal history of radiation therapy    PONV (postoperative nausea and vomiting)    Sinus bradycardia on ECG    Stroke [facility])     Patient Active Problem List   Diagnosis Date Noted   No-show for appointment 01/28/2020   Angina pectoris ([place])    History of stroke 06/13/2017   DOE (dyspnea on exertion) 05/24/2017   Occipital neuralgia of right side 11/02/2016   Essential hypertension 09/23/2015   Palpitations 06/17/2015   Cerebrovascular accident (CVA) due to occlusion of left middle cerebral artery ([place]) 06/17/2015   Hyperlipidemia 06/17/2015   HLD (hyperlipidemia)    Intracranial vascular stenosis    Cerebral infarction due to occlusion of left middle cerebral  artery (HCC)    CVA (cerebral vascular accident) ([place]) 04/27/2015   Stroke ([place]) 04/27/2015   Cephalalgia    Extrinsic asthma 07/02/2014   Migraine variant with status migrainosus 07/02/2014   Benign paroxysmal positional vertigo 07/02/2014   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/2014   Acute pancreatitis 03/03/2014   PUD (peptic ulcer disease) 03/03/2014   HTN (hypertension) 03/03/2014   Abdominal pain 03/03/2014   Nausea with vomiting 03/03/2014   Mild aortic insufficiency    H/O chest pain    Sinus bradycardia on ECG    G E R D 03/24/2008   ADENOCARCINOMA, BREAST, RIGHT 06/30/2007   ASTHMA 06/30/2007   Upper airway cough syndrome 06/30/2007    Past Surgical History:  Procedure Laterality Date   APPENDECTOMY     "30 years ago"   BREAST SURGERY     childbirth     x 2   CHOLECYSTECTOMY     "15 years ago"   ESOPHAGOGASTRODUODENOSCOPY N/A 03/04/2014   Procedure: ESOPHAGOGASTRODUODENOSCOPY (EGD);  Surgeon: [name], MD;  Location: [facility] ENDOSCOPY;  Service: Endoscopy;  Laterality: N/A;   MASTECTOMY  1993   right breast   OTHER SURGICAL HISTORY     hysterectomy   rectal fissure     RIGHT/LEFT HEART CATH AND CORONARY ANGIOGRAPHY N/A 05/30/2019   Procedure: RIGHT/LEFT HEART CATH AND CORONARY ANGIOGRAPHY;  Surgeon: [name], MD;  Location: [facility];  Service: Cardiovascular;  Laterality: N/A;   TUBAL LIGATION       OB History     Gravida  2   Para  2   Term      Preterm      AB      Living  2      SAB      IAB      Ectopic      Multiple      Live Births              Family History  Problem Relation Age of Onset   Heart attack Mother    Heart disease Mother    Hypertension Mother    Asthma Mother    Heart attack Father    Heart disease Father    Hypertension Sister    Heart attack Sister    Cancer Sister 77       colon   Hypertension Sister    Heart attack Sister     Social History   Tobacco Use   Smoking  status: Never   Smokeless tobacco: Never  Vaping Use   Vaping Use: Never used  Substance Use Topics   Alcohol use: No    Alcohol/week: 0.0 standard drinks   Drug use: No    Home Medications Prior to Admission medications   Medication Sig Start Date End Date Taking? Authorizing Provider  albuterol (VENTOLIN HFA) 108 (90 Base) MCG/ACT inhaler Inhale 1-2 puffs into the lungs every 4 (four) hours as needed for wheezing or shortness of breath.    [provider]  amLODipine (NORVASC) 5 MG tablet Take 5 mg by mouth daily.    [provider]  atorvastatin (LIPITOR) 80 MG tablet Take 1 tablet (80 mg total) by mouth daily at 6 PM. 07/06/15   Rosalin Hawking, MD  clonazePAM (KLONOPIN) 1 MG tablet Take 1 mg by mouth 2 (two) times daily as needed. 08/24/20   [provider]  clopidogrel (PLAVIX) 75 MG tablet Take 1 tablet (75 mg total) by mouth daily. 06/01/17   Rosalin Hawking, MD  colchicine 0.6 MG tablet TAKE 1 TABLET (0.6 MG TOTAL) BY MOUTH DAILY. TAKE AT ONSET OF GOUT ATTACK DAILY FOR 3-5 DAYS 01/21/21   Bronson Ing, DPM  Fluticasone-Salmeterol (ADVAIR) 250-50 MCG/DOSE AEPB Inhale 1 puff into the lungs 2 (two) times daily.    [provider]  isosorbide mononitrate (IMDUR) 30 MG 24 hr tablet TAKE 1 TABLET BY MOUTH  DAILY 01/14/21   [name], Wonda Cheng, MD  loratadine (CLARITIN) 10 MG tablet Take 10 mg by mouth daily as needed for allergies or rhinitis.     [provider]  metoprolol tartrate (LOPRESSOR) 25 MG tablet TAKE 1 TABLET BY MOUTH  TWICE DAILY 01/14/21   [name], Wonda Cheng, MD  montelukast (SINGULAIR) 10 MG tablet Take 1 tablet (10 mg total) by mouth at bedtime. 09/16/19   Spero Geralds, MD  nitroGLYCERIN (NITROSTAT) 0.4 MG SL tablet Place 1 tablet (0.4 mg total) under the tongue every 5 (five) minutes as needed for chest pain. 05/07/19 02/12/20  [name], Wonda Cheng, MD  nortriptyline (PAMELOR) 10 MG capsule Take 2 capsules (20 mg total) by mouth at bedtime.  11/29/17   Rosalin Hawking, MD  pantoprazole (PROTONIX) 40 MG tablet Take 40 mg by mouth daily as needed (Acid Reflux).  06/05/17   [provider]  sertraline (ZOLOFT) 100 MG tablet Take 100 mg by mouth daily. 10/06/20  [provider]    Allergies    Doxycycline and Percocet [oxycodone-acetaminophen]  Review of Systems   Review of Systems  Constitutional:  Positive for chills and fatigue. Negative for fever.  HENT:  Positive for congestion.   Eyes:  Negative for visual disturbance.  Respiratory:  Positive for cough, chest tightness, shortness of breath and wheezing.   Cardiovascular:  Negative for chest pain.  Gastrointestinal:  Negative for abdominal pain, diarrhea and vomiting.  Genitourinary:  Negative for dysuria.  Skin:  Negative for rash.  Neurological:  Negative for headaches.   Physical Exam Updated Vital Signs BP (!) 154/60 (BP Location: Left Arm)   Pulse 66   Temp 98.5 F (36.9 C) (Oral)   Resp 18   Ht '5\' 4"'$  (1.626 m)   Wt 71.2 kg   SpO2 94%   BMI 26.95 kg/m   Physical Exam Vitals and nursing note reviewed.  Constitutional:      General: She is not in acute distress.    Appearance: Normal appearance.  HENT:     Head: Normocephalic.     Mouth/Throat:     Mouth: Mucous membranes are moist.  Cardiovascular:     Rate and Rhythm: Normal rate.  Pulmonary:     Effort: Tachypnea present. No respiratory distress.     Breath sounds: Wheezing and rales present.  Abdominal:     Palpations: Abdomen is soft.     Tenderness: There is no abdominal tenderness.  Musculoskeletal:     Right lower leg: No edema.     Left lower leg: No edema.  Skin:    General: Skin is warm.  Neurological:     Mental Status: She is alert and oriented to person, place, and time. Mental status is at baseline.  Psychiatric:        Mood and Affect: Mood normal.    ED Results / Procedures / Treatments   Labs (all labs ordered are listed, but only abnormal results are  displayed) Labs Reviewed  CBC WITH DIFFERENTIAL/PLATELET - Abnormal; Notable for the following components:      Result Value   WBC 10.7 (*)    Abs Immature Granulocytes 0.15 (*)    All other components within normal limits  BASIC METABOLIC PANEL - Abnormal; Notable for the following components:   Potassium 2.9 (*)    Glucose, Bld 326 (*)    All other components within normal limits  RESP PANEL BY RT-PCR (FLU A&B, COVID) [id number]    EKG EKG Interpretation  Date/Time:  Tuesday May 25 2021 06:44:56 EDT Ventricular Rate:  79 PR Interval:  67 QRS Duration: 258 QT Interval:  427 QTC Calculation: 468 R Axis:   -7 Text Interpretation: Wandering atrial pacemaker Left ventricular hypertrophy Artifact in lead(s) I II III aVR aVL aVF V1 V4 When compared with ECG of 11/19/2020, No significant change was found Confirmed by Delora Fuel [id number]) on 05/25/2021 6:57:21 AM Also confirmed by Delora Fuel ([id number]), editor Ouida Sills, Jeannetta Nap [phone])  on 05/25/2021 7:10:24 AM  Radiology DG Chest Port 1 View  Result Date: 05/25/2021 CLINICAL DATA:  Shortness of breath EXAM: PORTABLE CHEST 1 VIEW COMPARISON:  11/19/2020 FINDINGS: Normal heart size and mediastinal contours. Reticulation of lung markings which is chronic and greatest at the right apex where there is some degree of presumed radiation fibrosis. Postoperative right breast and axilla. No acute osseous finding. IMPRESSION: Stable chest.  No acute finding. Electronically Signed   By: Jorje Guild M.D.   On: 05/25/2021  07:47    Procedures Procedures   Medications Ordered in ED Medications  albuterol (VENTOLIN HFA) 108 (90 Base) MCG/ACT inhaler 2 puff (has no administration in time range)  ipratropium-albuterol (DUONEB) 0.5-2.5 (3) MG/3ML nebulizer solution 3 mL (3 mLs Nebulization Given 05/25/21 0636)  albuterol (PROVENTIL) (2.5 MG/3ML) 0.083% nebulizer solution 2.5 mg (2.5 mg Nebulization Given 05/25/21 0636)  albuterol (PROVENTIL) (2.5  MG/3ML) 0.083% nebulizer solution 5 mg (5 mg Nebulization Given 05/25/21 0656)  ipratropium-albuterol (DUONEB) 0.5-2.5 (3) MG/3ML nebulizer solution 3 mL (3 mLs Nebulization Given 05/25/21 0809)  methylPREDNISolone sodium succinate (SOLU-MEDROL) 125 mg/2 mL injection 125 mg (125 mg Intravenous Given 05/25/21 0850)    ED Course  I have reviewed the triage vital signs and the nursing notes.  Pertinent labs & imaging results that were available during my care of the patient were reviewed by me and considered in my medical decision making (see chart for details).    MDM Rules/Calculators/A&P                           80 year old female presents emergency department shortness of breath and wheezing.  Just completed an outpatient regimen of Z-Pak and prednisone without any improvement.  She is tachypneic on arrival, conversationally dyspneic.  Diffuse wheezing and rales bilaterally.  Chest x-ray shows no pneumonia, flu and COVID swab are negative.  Mild leukocytosis on blood work with a new hypokalemia.  Patient endorses fatigue and decreased appetite secondary to illness.  Patient has received multiple breathing treatments as well as IV steroid with only minimal improvement.  At rest she is still conversationally dyspneic but maintains her oxygen saturations.  When ambulating she becomes very short of breath with audible wheezing and oxygen saturations that dropped below 90.  Plan for medical admission for further treatment.  Patients evaluation and results requires admission for further treatment and care. Patient agrees with admission plan, offers no new complaints and is stable/unchanged at time of admit.  Final Clinical Impression(s) / ED Diagnoses Final diagnoses:  SOB (shortness of breath)    Rx / DC Orders ED Discharge Orders     None        Lorelle Gibbs, DO 05/25/21 1118

## 2021-05-26 ENCOUNTER — Observation Stay (HOSPITAL_COMMUNITY): Payer: Medicare Other

## 2021-05-26 DIAGNOSIS — Z881 Allergy status to other antibiotic agents status: Secondary | ICD-10-CM | POA: Diagnosis not present

## 2021-05-26 DIAGNOSIS — Z9221 Personal history of antineoplastic chemotherapy: Secondary | ICD-10-CM | POA: Diagnosis not present

## 2021-05-26 DIAGNOSIS — I5030 Unspecified diastolic (congestive) heart failure: Secondary | ICD-10-CM

## 2021-05-26 DIAGNOSIS — J45901 Unspecified asthma with (acute) exacerbation: Secondary | ICD-10-CM | POA: Diagnosis present

## 2021-05-26 DIAGNOSIS — Z923 Personal history of irradiation: Secondary | ICD-10-CM | POA: Diagnosis not present

## 2021-05-26 DIAGNOSIS — R059 Cough, unspecified: Secondary | ICD-10-CM | POA: Diagnosis not present

## 2021-05-26 DIAGNOSIS — J9601 Acute respiratory failure with hypoxia: Secondary | ICD-10-CM | POA: Diagnosis present

## 2021-05-26 DIAGNOSIS — J4551 Severe persistent asthma with (acute) exacerbation: Secondary | ICD-10-CM

## 2021-05-26 DIAGNOSIS — Z886 Allergy status to analgesic agent status: Secondary | ICD-10-CM | POA: Diagnosis not present

## 2021-05-26 DIAGNOSIS — J189 Pneumonia, unspecified organism: Secondary | ICD-10-CM | POA: Diagnosis present

## 2021-05-26 DIAGNOSIS — Z7902 Long term (current) use of antithrombotics/antiplatelets: Secondary | ICD-10-CM | POA: Diagnosis not present

## 2021-05-26 DIAGNOSIS — E7800 Pure hypercholesterolemia, unspecified: Secondary | ICD-10-CM | POA: Diagnosis present

## 2021-05-26 DIAGNOSIS — Z20822 Contact with and (suspected) exposure to covid-19: Secondary | ICD-10-CM | POA: Diagnosis present

## 2021-05-26 DIAGNOSIS — Z9011 Acquired absence of right breast and nipple: Secondary | ICD-10-CM | POA: Diagnosis not present

## 2021-05-26 DIAGNOSIS — K279 Peptic ulcer, site unspecified, unspecified as acute or chronic, without hemorrhage or perforation: Secondary | ICD-10-CM | POA: Diagnosis present

## 2021-05-26 DIAGNOSIS — R0682 Tachypnea, not elsewhere classified: Secondary | ICD-10-CM | POA: Diagnosis not present

## 2021-05-26 DIAGNOSIS — J209 Acute bronchitis, unspecified: Secondary | ICD-10-CM | POA: Diagnosis present

## 2021-05-26 DIAGNOSIS — N179 Acute kidney failure, unspecified: Secondary | ICD-10-CM | POA: Diagnosis present

## 2021-05-26 DIAGNOSIS — Z825 Family history of asthma and other chronic lower respiratory diseases: Secondary | ICD-10-CM | POA: Diagnosis not present

## 2021-05-26 DIAGNOSIS — I5033 Acute on chronic diastolic (congestive) heart failure: Secondary | ICD-10-CM | POA: Diagnosis present

## 2021-05-26 DIAGNOSIS — Z79899 Other long term (current) drug therapy: Secondary | ICD-10-CM | POA: Diagnosis not present

## 2021-05-26 DIAGNOSIS — F32A Depression, unspecified: Secondary | ICD-10-CM | POA: Diagnosis present

## 2021-05-26 DIAGNOSIS — I700 Atherosclerosis of aorta: Secondary | ICD-10-CM | POA: Diagnosis not present

## 2021-05-26 DIAGNOSIS — R0602 Shortness of breath: Secondary | ICD-10-CM | POA: Diagnosis not present

## 2021-05-26 DIAGNOSIS — Z853 Personal history of malignant neoplasm of breast: Secondary | ICD-10-CM | POA: Diagnosis not present

## 2021-05-26 DIAGNOSIS — Z885 Allergy status to narcotic agent status: Secondary | ICD-10-CM | POA: Diagnosis not present

## 2021-05-26 DIAGNOSIS — R058 Other specified cough: Secondary | ICD-10-CM | POA: Diagnosis not present

## 2021-05-26 DIAGNOSIS — K219 Gastro-esophageal reflux disease without esophagitis: Secondary | ICD-10-CM | POA: Diagnosis present

## 2021-05-26 DIAGNOSIS — I110 Hypertensive heart disease with heart failure: Secondary | ICD-10-CM | POA: Diagnosis present

## 2021-05-26 DIAGNOSIS — Z8673 Personal history of transient ischemic attack (TIA), and cerebral infarction without residual deficits: Secondary | ICD-10-CM | POA: Diagnosis not present

## 2021-05-26 DIAGNOSIS — Z8249 Family history of ischemic heart disease and other diseases of the circulatory system: Secondary | ICD-10-CM | POA: Diagnosis not present

## 2021-05-26 LAB — CBC
HCT: 37.4 % (ref 36.0–46.0)
Hemoglobin: 12.9 g/dL (ref 12.0–15.0)
MCH: 29.7 pg (ref 26.0–34.0)
MCHC: 34.5 g/dL (ref 30.0–36.0)
MCV: 86 fL (ref 80.0–100.0)
Platelets: 224 10*3/uL (ref 150–400)
RBC: 4.35 MIL/uL (ref 3.87–5.11)
RDW: 13.9 % (ref 11.5–15.5)
WBC: 9.1 10*3/uL (ref 4.0–10.5)
nRBC: 0 % (ref 0.0–0.2)

## 2021-05-26 LAB — GLUCOSE, CAPILLARY
Glucose-Capillary: 205 mg/dL — ABNORMAL HIGH (ref 70–99)
Glucose-Capillary: 303 mg/dL — ABNORMAL HIGH (ref 70–99)
Glucose-Capillary: 305 mg/dL — ABNORMAL HIGH (ref 70–99)
Glucose-Capillary: 339 mg/dL — ABNORMAL HIGH (ref 70–99)
Glucose-Capillary: 453 mg/dL — ABNORMAL HIGH (ref 70–99)

## 2021-05-26 LAB — BASIC METABOLIC PANEL
Anion gap: 9 (ref 5–15)
BUN: 12 mg/dL (ref 8–23)
CO2: 28 mmol/L (ref 22–32)
Calcium: 8.9 mg/dL (ref 8.9–10.3)
Chloride: 98 mmol/L (ref 98–111)
Creatinine, Ser: 0.73 mg/dL (ref 0.44–1.00)
GFR, Estimated: >60 mL/min (ref >60)
Glucose, Bld: 205 mg/dL — ABNORMAL HIGH (ref 70–99)
Potassium: 4.1 mmol/L (ref 3.5–5.1)
Sodium: 135 mmol/L (ref 135–145)

## 2021-05-26 LAB — HEMOGLOBIN A1C
Hgb A1c MFr Bld: 7 % — ABNORMAL HIGH (ref 4.8–5.6)
Mean Plasma Glucose: 154.2 mg/dL

## 2021-05-26 LAB — PROCALCITONIN: Procalcitonin: <0.1 ng/mL

## 2021-05-26 LAB — BRAIN NATRIURETIC PEPTIDE: B Natriuretic Peptide: 83.1 pg/mL (ref 0.0–100.0)

## 2021-05-26 MED ORDER — FUROSEMIDE 10 MG/ML IJ SOLN
60.0000 mg | Freq: Once | INTRAMUSCULAR | Status: AC
  Administered 2021-05-26: 60 mg via INTRAVENOUS
  Filled 2021-05-26: qty 6

## 2021-05-26 MED ORDER — MAGNESIUM SULFATE IN D5W 1-5 GM/100ML-% IV SOLN
1.0000 g | Freq: Once | INTRAVENOUS | Status: AC
  Administered 2021-05-26: 1 g via INTRAVENOUS
  Filled 2021-05-26: qty 100

## 2021-05-26 MED ORDER — IPRATROPIUM BROMIDE 0.02 % IN SOLN
0.5000 mg | Freq: Four times a day (QID) | RESPIRATORY_TRACT | Status: DC
  Administered 2021-05-26 – 2021-05-27 (×5): 0.5 mg via RESPIRATORY_TRACT
  Filled 2021-05-26 (×5): qty 2.5

## 2021-05-26 MED ORDER — LEVOFLOXACIN 500 MG PO TABS
500.0000 mg | ORAL_TABLET | Freq: Every day | ORAL | Status: AC
  Administered 2021-05-26 – 2021-05-28 (×3): 500 mg via ORAL
  Filled 2021-05-26 (×4): qty 1

## 2021-05-26 MED ORDER — CARVEDILOL 6.25 MG PO TABS
6.2500 mg | ORAL_TABLET | Freq: Two times a day (BID) | ORAL | Status: DC
  Administered 2021-05-26 – 2021-05-30 (×8): 6.25 mg via ORAL
  Filled 2021-05-26 (×8): qty 1

## 2021-05-26 NOTE — Progress Notes (Signed)
PROGRESS NOTE                                                                                                                                                                                                             Patient Demographics:    [name], is a 80 y.o. female, DOB - 10-22-1940, [id number]  Outpatient Primary MD for the patient is Alroy Dust, L.Marlou Sa, MD    LOS - 0  Admit date - 05/25/2021    Chief Complaint  Patient presents with   Shortness of Breath       Brief Narrative (HPI from H&P)   [name] is a 80 y.o. female with medical history significant of asthma, HTN, Hx of CVA, PUD, GERD, breast cancer who presented to [facility] with persistent shortness of breath, cough and wheezing.  She had been treated as outpatient for bronchitis with a Z-pack and prednisone, but failed to improve, she was admitted to the hospital with ongoing shortness of breath with running diagnosis of asthma exacerbation.  Further work-up suggested that she had newly found CHF.   Subjective:    [name] today has, No headache, No chest pain, No abdominal pain - No Nausea, No new weakness tingling or numbness, +ve SOB and orthopnea.   Assessment  & Plan :     Acute Hypoxic Resp. Failure severe orthopnea ongoing for 2 to 3 weeks.  Likely new diagnosis of acute on chronic nonspecific CHF, stop IV steroids and antibiotics and place her on IV Lasix, supplemental oxygen, monitor electrolytes, obtain echocardiogram.  Monitor.   2.  Hypertension.  Blood pressure medications adjusted on 05/26/2021 for better control in addition to diuretics.  3.  Dyslipidemia.  On statin continue.  4. ? L.  Infiltrate.  Has finished out patient Z-Pak and steroid course, will give short dose of oral Levaquin and monitor.  No productive cough.  Will trend leukocyte count and procalcitonin.       Condition - Fair  Family Communication  : None  present  Code Status : Full  Consults  : None  PUD Prophylaxis : None   Procedures  :     Echocardiogram.      Disposition Plan  :    Status is: Observation  Dispo: The patient is from: Home  Anticipated d/c is to: Home              Patient currently is not medically stable to d/c.   Difficult to place patient No   DVT Prophylaxis  :    enoxaparin (LOVENOX) injection 40 mg Start: 05/25/21 1800  Lab Results  Component Value Date   PLT 224 05/26/2021    Diet :  Diet Order             Diet Heart Room service appropriate? Yes; Fluid consistency: Thin  Diet effective now                    Inpatient Medications  Scheduled Meds:  amLODipine  5 mg Oral Daily   atorvastatin  80 mg Oral q1800   clopidogrel  75 mg Oral Daily   DULoxetine  30 mg Oral Daily   enoxaparin (LOVENOX) injection  40 mg Subcutaneous Q24H   guaiFENesin  600 mg Oral BID   insulin aspart  0-9 Units Subcutaneous TID WC   ipratropium  0.5 mg Nebulization Q6H WA   isosorbide mononitrate  30 mg Oral Daily   loratadine  10 mg Oral Daily   methylPREDNISolone (SOLU-MEDROL) injection  60 mg Intravenous Q12H   metoprolol tartrate  25 mg Oral Daily   mometasone-formoterol  2 puff Inhalation BID   Continuous Infusions: PRN Meds:.acetaminophen **OR** acetaminophen, albuterol, bisacodyl, chlorpheniramine-HYDROcodone, clonazePAM, nitroGLYCERIN, senna-docusate, traZODone  Antibiotics  :    Anti-infectives (From admission, onward)    None        Time Spent in minutes  30   Lala Lund M.D on 05/26/2021 at 11:42 AM  To page go to [email or web address]   [facility] -  Office  [phone]    See all Orders from today for further details    Objective:   Vitals:   05/26/21 0401 05/26/21 0751 05/26/21 0753 05/26/21 0900  BP: (!) 150/70   (!) 166/72  Pulse: 74   79  Resp: 16   (!) 81  Temp: 98.8 F (37.1 C)     TempSrc: Oral     SpO2: (!) 89% 92% (!) 89% 90%   Weight:      Height:        Wt Readings from Last 3 Encounters:  05/25/21 71.9 kg  02/03/21 69.9 kg  11/19/20 70.3 kg     Intake/Output Summary (Last 24 hours) at 05/26/2021 1142 Last data filed at 05/25/2021 2041 Gross per 24 hour  Intake 240 ml  Output --  Net 240 ml     Physical Exam  Awake Alert, No new F.N deficits, Normal affect [place].AT,PERRAL Supple Neck,++ JVD, No cervical lymphadenopathy appriciated.  Symmetrical Chest wall movement, Good air movement bilaterally, ++ rales RRR,No Gallops,Rubs or new Murmurs, No Parasternal Heave +ve B.Sounds, Abd Soft, No tenderness, No organomegaly appriciated, No rebound - guarding or rigidity. No Cyanosis, Clubbing or edema, No new Rash or bruise      Data Review:    CBC Recent Labs  Lab 05/25/21 0759 05/26/21 0029  WBC 10.7* 9.1  HGB 12.9 12.9  HCT 36.3 37.4  PLT 254 224  MCV 85.2 86.0  MCH 30.3 29.7  MCHC 35.5 34.5  RDW 13.5 13.9  LYMPHSABS 3.9  --   MONOABS 0.9  --   EOSABS 0.1  --   BASOSABS 0.1  --     Recent Labs  Lab 05/25/21 0759 05/26/21 0029  NA 135 135  K 2.9* 4.1  CL 98 98  CO2 25 28  GLUCOSE 326* 205*  BUN 18 12  CREATININE 0.87 0.73  CALCIUM 9.3 8.9  MG 1.6*  --   HGBA1C  --  7.0*  BNP  --  83.1    ------------------------------------------------------------------------------------------------------------------ No results for input(s): CHOL, HDL, LDLCALC, TRIG, CHOLHDL, LDLDIRECT in the last 72 hours.  Lab Results  Component Value Date   HGBA1C 7.0 (H) 05/26/2021   ------------------------------------------------------------------------------------------------------------------ No results for input(s): TSH, T4TOTAL, T3FREE, THYROIDAB in the last 72 hours.  Invalid input(s): FREET3  Cardiac Enzymes No results for input(s): CKMB, TROPONINI, MYOGLOBIN in the last 168 hours.  Invalid input(s):  CK ------------------------------------------------------------------------------------------------------------------    Component Value Date/Time   BNP 83.1 05/26/2021 0029    Radiology Reports DG Chest Port 1 View  Result Date: 05/26/2021 CLINICAL DATA:  Shortness of breath EXAM: PORTABLE CHEST 1 VIEW COMPARISON:  Yesterday FINDINGS: Normal heart size and mediastinal contours. Vague hazy density in the left mid to lower chest. No Kerley lines, effusion, or pneumothorax. Postoperative right axilla and breast. IMPRESSION: 1. Equivocal early infiltrate in the left lung. 2. No cardiomegaly or pulmonary edema. Electronically Signed   By: Jorje Guild M.D.   On: 05/26/2021 07:58   DG Chest Port 1 View  Result Date: 05/25/2021 CLINICAL DATA:  Shortness of breath EXAM: PORTABLE CHEST 1 VIEW COMPARISON:  11/19/2020 FINDINGS: Normal heart size and mediastinal contours. Reticulation of lung markings which is chronic and greatest at the right apex where there is some degree of presumed radiation fibrosis. Postoperative right breast and axilla. No acute osseous finding. IMPRESSION: Stable chest.  No acute finding. Electronically Signed   By: Jorje Guild M.D.   On: 05/25/2021 07:47   MM 3D SCREEN BREAST UNI LEFT  Result Date: 05/13/2021 CLINICAL DATA:  Screening. EXAM: DIGITAL SCREENING UNILATERAL LEFT MAMMOGRAM WITH CAD AND TOMOSYNTHESIS TECHNIQUE: Left screening digital craniocaudal and mediolateral oblique mammograms were obtained. Left screening digital breast tomosynthesis was performed. The images were evaluated with computer-aided detection. COMPARISON:  Previous exam(s). ACR Breast Density Category c: The breast tissue is heterogeneously dense, which may obscure small masses. FINDINGS: The patient has had a right mastectomy. There are no findings suspicious for malignancy. IMPRESSION: No mammographic evidence of malignancy. A result letter of this screening mammogram will be mailed directly to  the patient. RECOMMENDATION: Screening mammogram in one year.  (Code:SM-L-37M) BI-RADS CATEGORY  1: Negative. Electronically Signed   By: Margarette Canada M.D.   On: 05/13/2021 15:26

## 2021-05-26 NOTE — Evaluation (Signed)
Physical Therapy Evaluation Patient Details Name: [name] MRN: [id number] DOB: [birth year] Today's Date: 05/26/2021  History of Present Illness  80yo female who presented on 9/13 with SOB, cough, and wheezing. Covid negative. Admited with acute asthma exacerbation secondary to bronchitis. PMH breast CA s/p mastectomy, HLD, HTN, CVA, cholecystectomy  Clinical Impression   Patient received in bed, pleasant and cooperative with PT this morning. Has been navigating to/from bathroom on an independent basis. Able to do so with PT, but with SpO2 desat to 84% on room air today- able to recover with increased time and PLB. Education provided on PLB and use of IS. Left up in recliner with all needs met, spouse present. Moving well and not in need of skilled PT services at [place], would benefit from therapy following her in house to help educate on energy conservation techniques and build functional activity tolerance.        Recommendations for follow up therapy are one component of a multi-disciplinary discharge planning process, led by the attending physician.  Recommendations may be updated based on patient status, additional functional criteria and insurance authorization.  Follow Up Recommendations No PT follow up    Equipment Recommendations  None recommended by PT    Recommendations for Other Services       Precautions / Restrictions Precautions Precautions: Other (comment) Precaution Comments: watch O2 sats Restrictions Weight Bearing Restrictions: No      Mobility  Bed Mobility Overal bed mobility: Independent                  Transfers Overall transfer level: Independent Equipment used: None                Ambulation/Gait Ambulation/Gait assistance: Supervision Gait Distance (Feet): 30 Feet (25fx3) Assistive device: IV Pole Gait Pattern/deviations: Step-through pattern Gait velocity: decreased   General Gait Details: gait pattern generally WNL, able to  manage IV pole well; Spo2 desat to 84% with short gait distances in room on RA  Stairs            Wheelchair Mobility    Modified Rankin (Stroke Patients Only)       Balance Overall balance assessment: Independent                                           Pertinent Vitals/Pain Pain Assessment: No/denies pain    Home Living Family/patient expects to be discharged to:: Private residence Living Arrangements: Spouse/significant other Available Help at Discharge: Family;Available 24 hours/day Type of Home: House Home Access: Stairs to enter;Ramped entrance Entrance Stairs-Rails: Can reach both Entrance Stairs-Number of Steps: 3 Home Layout: One level Home Equipment: Walker - 2 wheels;Wheelchair - manual;Cane - single point Additional Comments: does not use devices normally; no falls or close calls in the past 6 months; still driving    Prior Function Level of Independence: Independent               Hand Dominance        Extremity/Trunk Assessment   Upper Extremity Assessment Upper Extremity Assessment: Overall WFL for tasks assessed    Lower Extremity Assessment Lower Extremity Assessment: Overall WFL for tasks assessed    Cervical / Trunk Assessment Cervical / Trunk Assessment: Normal  Communication   Communication: No difficulties  Cognition Arousal/Alertness: Awake/alert Behavior During Therapy: WFL for tasks assessed/performed Overall Cognitive Status: Within Functional Limits for  tasks assessed                                        General Comments      Exercises     Assessment/Plan    PT Assessment Patient needs continued PT services  PT Problem List Decreased strength;Decreased activity tolerance;Decreased mobility       PT Treatment Interventions DME instruction;Balance training;Gait training;Stair training;Functional mobility training;Patient/family education;Therapeutic activities;Therapeutic  exercise    PT Goals (Current goals can be found in the Care Plan section)  Acute Rehab PT Goals Patient Stated Goal: be able to breathe better PT Goal Formulation: With patient Time For Goal Achievement: 06/09/21 Potential to Achieve Goals: Good    Frequency Min 2X/week   Barriers to discharge        Co-evaluation               AM-PAC PT "6 Clicks" Mobility  Outcome Measure Help needed turning from your back to your side while in a flat bed without using bedrails?: None Help needed moving from lying on your back to sitting on the side of a flat bed without using bedrails?: None Help needed moving to and from a bed to a chair (including a wheelchair)?: None Help needed standing up from a chair using your arms (e.g., wheelchair or bedside chair)?: None Help needed to walk in hospital room?: A Little Help needed climbing 3-5 steps with a railing? : A Little 6 Click Score: 22    End of Session   Activity Tolerance: Patient tolerated treatment well Patient left: in chair;with call bell/phone within reach;with family/visitor present Nurse Communication: Mobility status PT Visit Diagnosis: Muscle weakness (generalized) (M62.81)    Time: 6389-3734 PT Time Calculation (min) (ACUTE ONLY): 16 min   Charges:   PT Evaluation $PT Eval Moderate Complexity: 1 Mod         Ann Lions PT, DPT, PN2   Supplemental Physical Therapist [place]    Pager [phone] Acute Rehab Office [phone]

## 2021-05-26 NOTE — Plan of Care (Signed)
  Problem: Education: Goal: Knowledge of General Education information will improve Description Including pain rating scale, medication(s)/side effects and non-pharmacologic comfort measures Outcome: Progressing   Problem: Health Behavior/Discharge Planning: Goal: Ability to manage health-related needs will improve Outcome: Progressing   

## 2021-05-26 NOTE — Evaluation (Signed)
Occupational Therapy Evaluation Patient Details Name: [name] MRN: [id number] DOB: Oct 30, 1940 Today's Date: 05/26/2021   History of Present Illness 80yo female who presented on 9/13 with SOB, cough, and wheezing. Covid negative. Admited with acute asthma exacerbation secondary to bronchitis. PMH breast CA s/p mastectomy, HLD, HTN, CVA, cholecystectomy   Clinical Impression   Pt pleasant, with husband supportive at bedside; baseline pt is fully indep with all ADLs, IADLs and mobility, no recent falls. Currently limited primarily by activity tolerance and reports of increased feelings of anxiousness with activity and feelings SOB/smothered. Rating of 3 on dyspnea scale with cues and intro/education on ECTs and modifications. Overall pt completing seated/standing ADLs with indep level of performance but would significantly benefit from 1-2 more sessions for education and review of ECTs and modification of ADLs to increase ease and overall tolerance. OT will continue to follow acutely with no post acute follow up recommended at this time       Recommendations for follow up therapy are one component of a multi-disciplinary discharge planning process, led by the attending physician.  Recommendations may be updated based on patient status, additional functional criteria and insurance authorization.   Follow Up Recommendations  No OT follow up;Supervision - Intermittent    Equipment Recommendations  Tub/shower seat    Recommendations for Other Services       Precautions / Restrictions Precautions Precautions: Other (comment) Precaution Comments: monitor O2 sats and SOB Restrictions Weight Bearing Restrictions: No      Mobility Bed Mobility Overal bed mobility: Independent                  Transfers Overall transfer level: Independent Equipment used: None                  Balance Overall balance assessment: Independent                                          ADL either performed or assessed with clinical judgement   ADL Overall ADL's : Modified independent                                       General ADL Comments: overall demo's limitation being primarily onset of fatigue with repositioning and attempt for all LB or standing adls.     Vision Baseline Vision/History: 1 Wears glasses Ability to See in Adequate Light: 0 Adequate Patient Visual Report: No change from baseline       Perception     Praxis      Pertinent Vitals/Pain Pain Assessment: No/denies pain     Hand Dominance Right   Extremity/Trunk Assessment Upper Extremity Assessment Upper Extremity Assessment: Overall WFL for tasks assessed   Lower Extremity Assessment Lower Extremity Assessment: Overall WFL for tasks assessed   Cervical / Trunk Assessment Cervical / Trunk Assessment: Normal   Communication Communication Communication: No difficulties   Cognition Arousal/Alertness: Awake/alert Behavior During Therapy: WFL for tasks assessed/performed Overall Cognitive Status: Within Functional Limits for tasks assessed                                     General Comments       Exercises     Shoulder  Instructions      Home Living Family/patient expects to be discharged to:: Private residence Living Arrangements: Spouse/significant other Available Help at Discharge: Family;Available 24 hours/day Type of Home: House Home Access: Stairs to enter;Ramped entrance Entrance Stairs-Number of Steps: 3 Entrance Stairs-Rails: Can reach both Home Layout: One level     Bathroom Shower/Tub: Teacher, early years/pre: Handicapped height Bathroom Accessibility: Yes   Home Equipment: Environmental consultant - 2 wheels;Wheelchair - manual;Cane - single point;Grab bars - tub/shower   Additional Comments: pt with no hx of falls, endorsing not using DME for mobility or transfers at this time      Prior Functioning/Environment  Level of Independence: Independent                 OT Problem List: Decreased activity tolerance;Impaired balance (sitting and/or standing);Decreased knowledge of use of DME or AE;Decreased knowledge of precautions;Cardiopulmonary status limiting activity      OT Treatment/Interventions: Self-care/ADL training;Energy conservation;DME and/or AE instruction;Patient/family education    OT Goals(Current goals can be found in the care plan section) Acute Rehab OT Goals Patient Stated Goal: to not feels smothered when I breathe OT Goal Formulation: With patient Time For Goal Achievement: 06/09/21 Potential to Achieve Goals: Good ADL Goals Pt Will Perform Upper Body Bathing: with modified independence Pt Will Perform Lower Body Bathing: with modified independence Additional ADL Goal #1: pt will indep utilize 3 ECTs during completion dressing tasks reporting score of <= 2 dyspnea on RPE scale  OT Frequency: Min 2X/week (1-2 more sessions for education)   Barriers to D/C:            Co-evaluation              AM-PAC OT "6 Clicks" Daily Activity     Outcome Measure Help from another person eating meals?: None Help from another person taking care of personal grooming?: None Help from another person toileting, which includes using toliet, bedpan, or urinal?: None Help from another person bathing (including washing, rinsing, drying)?: A Little Help from another person to put on and taking off regular upper body clothing?: None Help from another person to put on and taking off regular lower body clothing?: None 6 Click Score: 23   End of Session Equipment Utilized During Treatment:  (IV pole) Nurse Communication: Mobility status  Activity Tolerance: Patient limited by fatigue Patient left: in bed;with family/visitor present  OT Visit Diagnosis: Unsteadiness on feet (R26.81);Other (comment)                [id number] OT Time Calculation (min): 18 min Charges:  OT General  Charges $OT Visit: 1 Visit OT Evaluation $OT Eval Low Complexity: 1 Low  Lavetta Geier OTR/L acute rehab services Office: [phone]  05/26/2021, 10:49 AM

## 2021-05-27 ENCOUNTER — Inpatient Hospital Stay (HOSPITAL_COMMUNITY): Payer: Medicare Other

## 2021-05-27 DIAGNOSIS — J4551 Severe persistent asthma with (acute) exacerbation: Secondary | ICD-10-CM | POA: Diagnosis not present

## 2021-05-27 LAB — COMPREHENSIVE METABOLIC PANEL
ALT: 22 U/L (ref 0–44)
AST: 16 U/L (ref 15–41)
Albumin: 3.2 g/dL — ABNORMAL LOW (ref 3.5–5.0)
Alkaline Phosphatase: 71 U/L (ref 38–126)
Anion gap: 10 (ref 5–15)
BUN: 33 mg/dL — ABNORMAL HIGH (ref 8–23)
CO2: 24 mmol/L (ref 22–32)
Calcium: 9 mg/dL (ref 8.9–10.3)
Chloride: 97 mmol/L — ABNORMAL LOW (ref 98–111)
Creatinine, Ser: 1.32 mg/dL — ABNORMAL HIGH (ref 0.44–1.00)
GFR, Estimated: 41 mL/min — ABNORMAL LOW (ref >60)
Glucose, Bld: 252 mg/dL — ABNORMAL HIGH (ref 70–99)
Potassium: 3.9 mmol/L (ref 3.5–5.1)
Sodium: 131 mmol/L — ABNORMAL LOW (ref 135–145)
Total Bilirubin: 0.8 mg/dL (ref 0.3–1.2)
Total Protein: 5.9 g/dL — ABNORMAL LOW (ref 6.5–8.1)

## 2021-05-27 LAB — PROCALCITONIN: Procalcitonin: 0.16 ng/mL

## 2021-05-27 LAB — CBC WITH DIFFERENTIAL/PLATELET
Abs Immature Granulocytes: 0.09 10*3/uL — ABNORMAL HIGH (ref 0.00–0.07)
Basophils Absolute: 0 10*3/uL (ref 0.0–0.1)
Basophils Relative: 0 %
Eosinophils Absolute: 0.3 10*3/uL (ref 0.0–0.5)
Eosinophils Relative: 3 %
HCT: 35.9 % — ABNORMAL LOW (ref 36.0–46.0)
Hemoglobin: 12.3 g/dL (ref 12.0–15.0)
Immature Granulocytes: 1 %
Lymphocytes Relative: 8 %
Lymphs Abs: 0.8 10*3/uL (ref 0.7–4.0)
MCH: 29.9 pg (ref 26.0–34.0)
MCHC: 34.3 g/dL (ref 30.0–36.0)
MCV: 87.1 fL (ref 80.0–100.0)
Monocytes Absolute: 0.6 10*3/uL (ref 0.1–1.0)
Monocytes Relative: 6 %
Neutro Abs: 8.1 10*3/uL — ABNORMAL HIGH (ref 1.7–7.7)
Neutrophils Relative %: 82 %
Platelets: 245 10*3/uL (ref 150–400)
RBC: 4.12 MIL/uL (ref 3.87–5.11)
RDW: 13.3 % (ref 11.5–15.5)
WBC: 9.9 10*3/uL (ref 4.0–10.5)
nRBC: 0 % (ref 0.0–0.2)

## 2021-05-27 LAB — GLUCOSE, CAPILLARY
Glucose-Capillary: 261 mg/dL — ABNORMAL HIGH (ref 70–99)
Glucose-Capillary: 250 mg/dL — ABNORMAL HIGH (ref 70–99)
Glucose-Capillary: 322 mg/dL — ABNORMAL HIGH (ref 70–99)
Glucose-Capillary: 195 mg/dL — ABNORMAL HIGH (ref 70–99)
Glucose-Capillary: 321 mg/dL — ABNORMAL HIGH (ref 70–99)

## 2021-05-27 LAB — MAGNESIUM: Magnesium: 1.9 mg/dL (ref 1.7–2.4)

## 2021-05-27 LAB — BRAIN NATRIURETIC PEPTIDE: B Natriuretic Peptide: 66.3 pg/mL (ref 0.0–100.0)

## 2021-05-27 MED ORDER — IPRATROPIUM BROMIDE 0.02 % IN SOLN
0.5000 mg | Freq: Two times a day (BID) | RESPIRATORY_TRACT | Status: DC
  Administered 2021-05-27 – 2021-05-30 (×5): 0.5 mg via RESPIRATORY_TRACT
  Filled 2021-05-27 (×8): qty 2.5

## 2021-05-27 MED ORDER — LACTATED RINGERS IV SOLN: INTRAVENOUS | Status: AC

## 2021-05-27 NOTE — Progress Notes (Signed)
Patients daughter given discharge instructions and stated understanding.

## 2021-05-27 NOTE — Plan of Care (Signed)
  Problem: Education: Goal: Knowledge of General Education information will improve Description Including pain rating scale, medication(s)/side effects and non-pharmacologic comfort measures Outcome: Progressing   Problem: Health Behavior/Discharge Planning: Goal: Ability to manage health-related needs will improve Outcome: Progressing   

## 2021-05-27 NOTE — Progress Notes (Signed)
PROGRESS NOTE                                                                                                                                                                                                             Patient Demographics:    [name], is a 80 y.o. female, DOB - May 11, 1941, [id number]  Outpatient Primary MD for the patient is Alroy Dust, L.Marlou Sa, MD    LOS - 1  Admit date - 05/25/2021    Chief Complaint  Patient presents with   Shortness of Breath       Brief Narrative (HPI from H&P)   [name] is a 80 y.o. female with medical history significant of asthma, HTN, Hx of CVA, PUD, GERD, breast cancer who presented to [facility] with persistent shortness of breath, cough and wheezing.  She had been treated as outpatient for bronchitis with a Z-pack and prednisone, but failed to improve, she was admitted to the hospital with ongoing shortness of breath with running diagnosis of asthma exacerbation.  Further work-up suggested that she had newly found CHF.   Subjective:    [name] today has, No headache, No chest pain, No abdominal pain - No Nausea, No new weakness tingling or numbness, +ve SOB and orthopnea.   Assessment  & Plan :     Acute Hypoxic Resp. Failure severe orthopnea ongoing for 2 to 3 weeks.  Likely new diagnosis of acute on chronic Diastolic CHF - EF [id number] -  stopped IV steroids has diuresed well with IV Lasix hold further Lasix, question of pneumonia as procalcitonin is borderline with questionable left lower lobe infiltrate, obtain noncontrast CT, continue empiric Levaquin for now.   2.  Hypertension.  Blood pressure medications adjusted on 05/26/2021 for better control we will continue to monitor.  3.  Dyslipidemia.  On statin continue.  4. ? L.  Infiltrate.  Has finished out patient Z-Pak and steroid course, will give short dose of oral Levaquin and monitor.  No productive cough.  Will trend  leukocyte count and procalcitonin.  5.  AKI.  Gentle hydration.      Condition - Fair  Family Communication  : None present  Code Status : Full  Consults  : None  PUD Prophylaxis : None   Procedures  :     CT  Echocardiogram -  1. Left ventricular ejection fraction, by estimation, is 55 to 60%. The left ventricle has normal function. The left ventricle has no regional wall motion abnormalities. There is mild concentric left ventricular hypertrophy. Left ventricular diastolic parameters are consistent with Grade I diastolic dysfunction (impaired relaxation).  2. Right ventricular systolic function is normal. The right ventricular size is normal. There is severely elevated pulmonary artery systolic pressure.  3. The mitral valve is normal in structure. No evidence of mitral valve regurgitation. No evidence of mitral stenosis.  4. The aortic valve is tricuspid. Aortic valve regurgitation is mild. No aortic stenosis is present.  5. The inferior vena cava is normal in size with greater than 50% respiratory variability, suggesting right atrial pressure of 3 mmHg.      Disposition Plan  :    Status is: Observation  Dispo: The patient is from: Home              Anticipated d/c is to: Home              Patient currently is not medically stable to d/c.   Difficult to place patient No   DVT Prophylaxis  :    enoxaparin (LOVENOX) injection 40 mg Start: 05/25/21 1800  Lab Results  Component Value Date   PLT 245 05/27/2021    Diet :  Diet Order             Diet Heart Room service appropriate? Yes; Fluid consistency: Thin  Diet effective now                    Inpatient Medications  Scheduled Meds:  amLODipine  5 mg Oral Daily   atorvastatin  80 mg Oral q1800   carvedilol  6.25 mg Oral BID WC   clopidogrel  75 mg Oral Daily   DULoxetine  30 mg Oral Daily   enoxaparin (LOVENOX) injection  40 mg Subcutaneous Q24H   guaiFENesin  600 mg Oral BID   insulin aspart  0-9  Units Subcutaneous TID WC   ipratropium  0.5 mg Nebulization BID   isosorbide mononitrate  30 mg Oral Daily   levofloxacin  500 mg Oral Daily   loratadine  10 mg Oral Daily   mometasone-formoterol  2 puff Inhalation BID   Continuous Infusions:  lactated ringers 100 mL/hr at 05/27/21 0852   PRN Meds:.acetaminophen **OR** acetaminophen, albuterol, bisacodyl, chlorpheniramine-HYDROcodone, clonazePAM, nitroGLYCERIN, senna-docusate, traZODone  Antibiotics  :    Anti-infectives (From admission, onward)    Start     Dose/Rate Route Frequency Ordered Stop   05/26/21 1300  levofloxacin (LEVAQUIN) tablet 500 mg        500 mg Oral Daily 05/26/21 1202 05/29/21 0959        Time Spent in minutes  30   [name] M.D on 05/27/2021 at 11:36 AM  To page go to [email or web address]   [facility] -  Office  [phone]    See all Orders from today for further details    Objective:   Vitals:   05/27/21 0400 05/27/21 0745 05/27/21 0746 05/27/21 0756  BP: 139/71   (!) 152/71  Pulse: 71   76  Resp: 17   16  Temp: 98.7 F (37.1 C)     TempSrc: Oral     SpO2: 90% 94% 90% 92%  Weight:      Height:        Wt Readings from Last 3 Encounters:  05/25/21 71.9 kg  02/03/21 69.9  kg  11/19/20 70.3 kg     Intake/Output Summary (Last 24 hours) at 05/27/2021 1136 Last data filed at 05/27/2021 0201 Gross per 24 hour  Intake 480 ml  Output --  Net 480 ml     Physical Exam  Awake Alert, No new F.N deficits, Normal affect [place].AT,PERRAL Supple Neck,No JVD, No cervical lymphadenopathy appriciated.  Symmetrical Chest wall movement, Good air movement bilaterally, few rales RRR,No Gallops, Rubs or new Murmurs, No Parasternal Heave +ve B.Sounds, Abd Soft, No tenderness, No organomegaly appriciated, No rebound - guarding or rigidity. No Cyanosis, Clubbing or edema, No new Rash or bruise    Data Review:    CBC Recent Labs  Lab 05/25/21 0759 05/26/21 0029 05/27/21 0042  WBC 10.7*  9.1 9.9  HGB 12.9 12.9 12.3  HCT 36.3 37.4 35.9*  PLT 254 224 245  MCV 85.2 86.0 87.1  MCH 30.3 29.7 29.9  MCHC 35.5 34.5 34.3  RDW 13.5 13.9 13.3  LYMPHSABS 3.9  --  0.8  MONOABS 0.9  --  0.6  EOSABS 0.1  --  0.3  BASOSABS 0.1  --  0.0    Recent Labs  Lab 05/25/21 0759 05/26/21 0029 05/27/21 0042  NA 135 135 131*  K 2.9* 4.1 3.9  CL 98 98 97*  CO2 '25 28 24  '$ GLUCOSE 326* 205* 252*  BUN 18 12 33*  CREATININE 0.87 0.73 1.32*  CALCIUM 9.3 8.9 9.0  AST  --   --  16  ALT  --   --  22  ALKPHOS  --   --  71  BILITOT  --   --  0.8  ALBUMIN  --   --  3.2*  MG 1.6*  --  1.9  PROCALCITON  --  <0.10 0.16  HGBA1C  --  7.0*  --   BNP  --  83.1 66.3    ------------------------------------------------------------------------------------------------------------------ No results for input(s): CHOL, HDL, LDLCALC, TRIG, CHOLHDL, LDLDIRECT in the last 72 hours.  Lab Results  Component Value Date   HGBA1C 7.0 (H) 05/26/2021   ------------------------------------------------------------------------------------------------------------------ No results for input(s): TSH, T4TOTAL, T3FREE, THYROIDAB in the last 72 hours.  Invalid input(s): FREET3  Cardiac Enzymes No results for input(s): CKMB, TROPONINI, MYOGLOBIN in the last 168 hours.  Invalid input(s): CK ------------------------------------------------------------------------------------------------------------------    Component Value Date/Time   BNP 66.3 05/27/2021 0042    Radiology Reports DG Chest Port 1 View  Result Date: 05/26/2021 CLINICAL DATA:  Shortness of breath EXAM: PORTABLE CHEST 1 VIEW COMPARISON:  Yesterday FINDINGS: Normal heart size and mediastinal contours. Vague hazy density in the left mid to lower chest. No Kerley lines, effusion, or pneumothorax. Postoperative right axilla and breast. IMPRESSION: 1. Equivocal early infiltrate in the left lung. 2. No cardiomegaly or pulmonary edema. Electronically Signed    By: Jorje Guild M.D.   On: 05/26/2021 07:58   DG Chest Port 1 View  Result Date: 05/25/2021 CLINICAL DATA:  Shortness of breath EXAM: PORTABLE CHEST 1 VIEW COMPARISON:  11/19/2020 FINDINGS: Normal heart size and mediastinal contours. Reticulation of lung markings which is chronic and greatest at the right apex where there is some degree of presumed radiation fibrosis. Postoperative right breast and axilla. No acute osseous finding. IMPRESSION: Stable chest.  No acute finding. Electronically Signed   By: Jorje Guild M.D.   On: 05/25/2021 07:47   MM 3D SCREEN BREAST UNI LEFT  Result Date: 05/13/2021 CLINICAL DATA:  Screening. EXAM: DIGITAL SCREENING UNILATERAL LEFT MAMMOGRAM WITH CAD  AND TOMOSYNTHESIS TECHNIQUE: Left screening digital craniocaudal and mediolateral oblique mammograms were obtained. Left screening digital breast tomosynthesis was performed. The images were evaluated with computer-aided detection. COMPARISON:  Previous exam(s). ACR Breast Density Category c: The breast tissue is heterogeneously dense, which may obscure small masses. FINDINGS: The patient has had a right mastectomy. There are no findings suspicious for malignancy. IMPRESSION: No mammographic evidence of malignancy. A result letter of this screening mammogram will be mailed directly to the patient. RECOMMENDATION: Screening mammogram in one year.  (Code:SM-L-76M) BI-RADS CATEGORY  1: Negative. Electronically Signed   By: Margarette Canada M.D.   On: 05/13/2021 15:26   ECHOCARDIOGRAM COMPLETE  Result Date: 05/26/2021    ECHOCARDIOGRAM REPORT   Patient Name:   MERCADES KOMM Date of Exam: 05/26/2021 Medical Rec #:  [id number]     Height:       65.0 in Accession #:    [id number]    Weight:       158.5 lb Date of Birth:  1941/04/29      BSA:          1.792 m Patient Age:    50 years      BP:           173/62 mmHg Patient Gender: F             HR:           79 bpm. Exam Location:  Inpatient Procedure: 2D Echo, Cardiac Doppler and  Color Doppler Indications:    I50.30* Unspecified diastolic (congestive) heart failure  History:        Patient has prior history of Echocardiogram examinations, most                 recent 05/23/2019. Signs/Symptoms:Chest Pain; Risk                 Factors:Hypertension.  Sonographer:    MH Referring Phys: [place]  1. Left ventricular ejection fraction, by estimation, is 55 to 60%. The left ventricle has normal function. The left ventricle has no regional wall motion abnormalities. There is mild concentric left ventricular hypertrophy. Left ventricular diastolic parameters are consistent with Grade I diastolic dysfunction (impaired relaxation).  2. Right ventricular systolic function is normal. The right ventricular size is normal. There is severely elevated pulmonary artery systolic pressure.  3. The mitral valve is normal in structure. No evidence of mitral valve regurgitation. No evidence of mitral stenosis.  4. The aortic valve is tricuspid. Aortic valve regurgitation is mild. No aortic stenosis is present.  5. The inferior vena cava is normal in size with greater than 50% respiratory variability, suggesting right atrial pressure of 3 mmHg. FINDINGS  Left Ventricle: Left ventricular ejection fraction, by estimation, is 55 to 60%. The left ventricle has normal function. The left ventricle has no regional wall motion abnormalities. The left ventricular internal cavity size was normal in size. There is  mild concentric left ventricular hypertrophy. Left ventricular diastolic parameters are consistent with Grade I diastolic dysfunction (impaired relaxation). Indeterminate filling pressures. Right Ventricle: The right ventricular size is normal. No increase in right ventricular wall thickness. Right ventricular systolic function is normal. There is severely elevated pulmonary artery systolic pressure. The tricuspid regurgitant velocity is 9.00 m/s, and with an assumed right atrial pressure of  3 mmHg, the estimated right ventricular systolic pressure is [id number] mmHg. Left Atrium: Left atrial size was normal in size. Right Atrium: Right atrial size was  normal in size. Pericardium: There is no evidence of pericardial effusion. Mitral Valve: The mitral valve is normal in structure. No evidence of mitral valve regurgitation. No evidence of mitral valve stenosis. Tricuspid Valve: The tricuspid valve is normal in structure. Tricuspid valve regurgitation is trivial. No evidence of tricuspid stenosis. Aortic Valve: The aortic valve is tricuspid. Aortic valve regurgitation is mild. Aortic regurgitation PHT measures 662 msec. No aortic stenosis is present. Aortic valve mean gradient measures 2.5 mmHg. Aortic valve peak gradient measures 3.4 mmHg. Aortic  valve area, by VTI measures 2.59 cm. Pulmonic Valve: The pulmonic valve was normal in structure. Pulmonic valve regurgitation is trivial. No evidence of pulmonic stenosis. Aorta: The aortic root is normal in size and structure. Venous: The inferior vena cava is normal in size with greater than 50% respiratory variability, suggesting right atrial pressure of 3 mmHg. IAS/Shunts: No atrial level shunt detected by color flow Doppler.  LEFT VENTRICLE PLAX 2D LVIDd:         4.10 cm     Diastology LVIDs:         2.90 cm     LV e' medial:    4.13 cm/s LV PW:         1.20 cm     LV E/e' medial:  13.6 LV IVS:        1.30 cm     LV e' lateral:   6.09 cm/s LVOT diam:     2.00 cm     LV E/e' lateral: 9.2 LV SV:         52 LV SV Index:   29 LVOT Area:     3.14 cm  LV Volumes (MOD) LV vol d, MOD A4C: 31.7 ml LV vol s, MOD A4C: 14.8 ml LV SV MOD A4C:     31.7 ml RIGHT VENTRICLE            IVC RV S prime:     9.79 cm/s  IVC diam: 1.80 cm TAPSE (M-mode): 2.3 cm LEFT ATRIUM             Index       RIGHT ATRIUM          Index LA diam:        2.70 cm 1.51 cm/m  RA Area:     6.10 cm LA Vol (A2C):   33.3 ml 18.58 ml/m RA Volume:   7.20 ml  4.02 ml/m LA Vol (A4C):   20.0 ml 11.16  ml/m LA Biplane Vol: 27.7 ml 15.46 ml/m  AORTIC VALVE                   PULMONIC VALVE AV Area (Vmax):    2.56 cm    PV Vmax:       0.40 m/s AV Area (Vmean):   2.49 cm    PV Peak grad:  0.6 mmHg AV Area (VTI):     2.59 cm AV Vmax:           92.65 cm/s AV Vmean:          70.400 cm/s AV VTI:            0.201 m AV Peak Grad:      3.4 mmHg AV Mean Grad:      2.5 mmHg LVOT Vmax:         75.50 cm/s LVOT Vmean:        55.800 cm/s LVOT VTI:  0.166 m LVOT/AV VTI ratio: 0.83 AI PHT:            662 msec  AORTA Ao Root diam: 2.70 cm Ao Asc diam:  2.90 cm MITRAL VALVE                TRICUSPID VALVE MV Area (PHT): 4.78 cm     TR Peak grad:   324.0 mmHg MV E velocity: 56.30 cm/s   TR Vmax:        900.00 cm/s MV A velocity: 108.00 cm/s MV E/A ratio:  0.52         SHUNTS                             Systemic VTI:  0.17 m                             Systemic Diam: 2.00 cm Skeet Latch MD Electronically signed by Skeet Latch MD Signature Date/Time: 05/26/2021/2:12:30 PM    Final

## 2021-05-28 DIAGNOSIS — J4551 Severe persistent asthma with (acute) exacerbation: Secondary | ICD-10-CM | POA: Diagnosis not present

## 2021-05-28 LAB — CBC WITH DIFFERENTIAL/PLATELET
Abs Immature Granulocytes: 0.05 10*3/uL (ref 0.00–0.07)
Basophils Absolute: 0 10*3/uL (ref 0.0–0.1)
Basophils Relative: 0 %
Eosinophils Absolute: 0.2 10*3/uL (ref 0.0–0.5)
Eosinophils Relative: 2 %
HCT: 34.5 % — ABNORMAL LOW (ref 36.0–46.0)
Hemoglobin: 11.6 g/dL — ABNORMAL LOW (ref 12.0–15.0)
Immature Granulocytes: 1 %
Lymphocytes Relative: 25 %
Lymphs Abs: 2 10*3/uL (ref 0.7–4.0)
MCH: 29.4 pg (ref 26.0–34.0)
MCHC: 33.6 g/dL (ref 30.0–36.0)
MCV: 87.6 fL (ref 80.0–100.0)
Monocytes Absolute: 0.5 10*3/uL (ref 0.1–1.0)
Monocytes Relative: 6 %
Neutro Abs: 5.2 10*3/uL (ref 1.7–7.7)
Neutrophils Relative %: 66 %
Platelets: 215 10*3/uL (ref 150–400)
RBC: 3.94 MIL/uL (ref 3.87–5.11)
RDW: 13.5 % (ref 11.5–15.5)
WBC: 8 10*3/uL (ref 4.0–10.5)
nRBC: 0 % (ref 0.0–0.2)

## 2021-05-28 LAB — COMPREHENSIVE METABOLIC PANEL
ALT: 17 U/L (ref 0–44)
AST: 14 U/L — ABNORMAL LOW (ref 15–41)
Albumin: 2.8 g/dL — ABNORMAL LOW (ref 3.5–5.0)
Alkaline Phosphatase: 61 U/L (ref 38–126)
Anion gap: 10 (ref 5–15)
BUN: 29 mg/dL — ABNORMAL HIGH (ref 8–23)
CO2: 25 mmol/L (ref 22–32)
Calcium: 8.6 mg/dL — ABNORMAL LOW (ref 8.9–10.3)
Chloride: 96 mmol/L — ABNORMAL LOW (ref 98–111)
Creatinine, Ser: 1.05 mg/dL — ABNORMAL HIGH (ref 0.44–1.00)
GFR, Estimated: 53 mL/min — ABNORMAL LOW (ref >60)
Glucose, Bld: 269 mg/dL — ABNORMAL HIGH (ref 70–99)
Potassium: 3.8 mmol/L (ref 3.5–5.1)
Sodium: 131 mmol/L — ABNORMAL LOW (ref 135–145)
Total Bilirubin: 0.9 mg/dL (ref 0.3–1.2)
Total Protein: 5.2 g/dL — ABNORMAL LOW (ref 6.5–8.1)

## 2021-05-28 LAB — GLUCOSE, CAPILLARY
Glucose-Capillary: 221 mg/dL — ABNORMAL HIGH (ref 70–99)
Glucose-Capillary: 232 mg/dL — ABNORMAL HIGH (ref 70–99)
Glucose-Capillary: 254 mg/dL — ABNORMAL HIGH (ref 70–99)
Glucose-Capillary: 300 mg/dL — ABNORMAL HIGH (ref 70–99)

## 2021-05-28 LAB — URIC ACID: Uric Acid, Serum: 7.9 mg/dL — ABNORMAL HIGH (ref 2.5–7.1)

## 2021-05-28 LAB — BRAIN NATRIURETIC PEPTIDE: B Natriuretic Peptide: 25.5 pg/mL (ref 0.0–100.0)

## 2021-05-28 LAB — PROCALCITONIN: Procalcitonin: <0.1 ng/mL

## 2021-05-28 LAB — MAGNESIUM: Magnesium: 1.8 mg/dL (ref 1.7–2.4)

## 2021-05-28 LAB — OSMOLALITY: Osmolality: 297 mOsm/kg — ABNORMAL HIGH (ref 275–295)

## 2021-05-28 MED ORDER — PREDNISONE 5 MG PO TABS
50.0000 mg | ORAL_TABLET | Freq: Once | ORAL | Status: AC
  Administered 2021-05-28: 50 mg via ORAL
  Filled 2021-05-28: qty 2

## 2021-05-28 MED ORDER — MENTHOL 3 MG MT LOZG
1.0000 | LOZENGE | OROMUCOSAL | Status: DC | PRN
  Filled 2021-05-28: qty 9

## 2021-05-28 MED ORDER — FUROSEMIDE 40 MG PO TABS
40.0000 mg | ORAL_TABLET | Freq: Once | ORAL | Status: AC
  Administered 2021-05-28: 40 mg via ORAL
  Filled 2021-05-28: qty 1

## 2021-05-28 NOTE — Progress Notes (Signed)
Physical Therapy Treatment Patient Details Name: [name] MRN: [id number] DOB: 06-30-1941 Today's Date: 05/28/2021   History of Present Illness 80yo female who presented on 9/13 with SOB, cough, and wheezing. Covid negative. Admited with acute asthma exacerbation secondary to bronchitis. PMH breast CA s/p mastectomy, HLD, HTN, CVA, cholecystectomy    PT Comments    Good mobility progression noted. Pt is independent with mobility in room. Supervision provided for hallway amb. Pt amb 200' without AD on RA with SpO2 91%. Pt reports she is feeling much better and is hopeful for d/c home tomorrow. Current POC remains appropriate.    Recommendations for follow up therapy are one component of a multi-disciplinary discharge planning process, led by the attending physician.  Recommendations may be updated based on patient status, additional functional criteria and insurance authorization.  Follow Up Recommendations  No PT follow up     Equipment Recommendations  None recommended by PT    Recommendations for Other Services       Precautions / Restrictions Precautions Precautions: Other (comment) Precaution Comments: monitor O2 sats and SOB Restrictions Weight Bearing Restrictions: No     Mobility  Bed Mobility Overal bed mobility: Independent                  Transfers Overall transfer level: Independent Equipment used: None             General transfer comment: mobilizing in room independently  Ambulation/Gait Ambulation/Gait assistance: Supervision Gait Distance (Feet): 200 Feet Assistive device: None Gait Pattern/deviations: WFL(Within Functional Limits) Gait velocity: WFL Gait velocity interpretation: >2.62 ft/sec, indicative of community ambulatory General Gait Details: SpO2 91% on RA after ambulation. Mild SOB noted. Pt able to hold conversation throughout mobility.   Stairs             Wheelchair Mobility    Modified Rankin (Stroke Patients  Only)       Balance Overall balance assessment: Independent                                          Cognition Arousal/Alertness: Awake/alert Behavior During Therapy: WFL for tasks assessed/performed Overall Cognitive Status: Within Functional Limits for tasks assessed                                        Exercises      General Comments General comments (skin integrity, edema, etc.): able to maintain SpO2 > 90% on RA      Pertinent Vitals/Pain Pain Assessment: No/denies pain    Home Living                      Prior Function            PT Goals (current goals can now be found in the care plan section) Acute Rehab PT Goals Patient Stated Goal: home tomorrow Progress towards PT goals: Progressing toward goals    Frequency    Min 2X/week      PT Plan Current plan remains appropriate    Co-evaluation              AM-PAC PT "6 Clicks" Mobility   Outcome Measure  Help needed turning from your back to your side while in a flat bed without using bedrails?: None  Help needed moving from lying on your back to sitting on the side of a flat bed without using bedrails?: None Help needed moving to and from a bed to a chair (including a wheelchair)?: None Help needed standing up from a chair using your arms (e.g., wheelchair or bedside chair)?: None Help needed to walk in hospital room?: A Little Help needed climbing 3-5 steps with a railing? : A Little 6 Click Score: 22    End of Session Equipment Utilized During Treatment: Gait belt Activity Tolerance: Patient tolerated treatment well Patient left: in bed;with call bell/phone within reach;with family/visitor present Nurse Communication: Mobility status PT Visit Diagnosis: Muscle weakness (generalized) (M62.81)     Time: 0951-1000 PT Time Calculation (min) (ACUTE ONLY): 9 min  Charges:  $Gait Training: 8-22 mins                     [name], PT  Office  # [phone] Pager [phone]    [name] 05/28/2021, 11:16 AM

## 2021-05-28 NOTE — Progress Notes (Signed)
PROGRESS NOTE                                                                                                                                                                                                             Patient Demographics:    [name], is a 80 y.o. female, DOB - 1940/10/21, [id number]  Outpatient Primary MD for the patient is Alroy Dust, L.Marlou Sa, MD    LOS - 2  Admit date - 05/25/2021    Chief Complaint  Patient presents with   Shortness of Breath       Brief Narrative (HPI from H&P)   [name] is a 80 y.o. female with medical history significant of asthma, HTN, Hx of CVA, PUD, GERD, breast cancer who presented to [facility] with persistent shortness of breath, cough and wheezing.  She had been treated as outpatient for bronchitis with a Z-pack and prednisone, but failed to improve, she was admitted to the hospital with ongoing shortness of breath with running diagnosis of asthma exacerbation.  Further work-up suggested that she had newly found CHF.   Subjective:   Patient in bed, appears comfortable, denies any headache, no fever, no chest pain or pressure, improved shortness of breath , no abdominal pain. No new focal weakness.    Assessment  & Plan :     Acute Hypoxic Resp. Failure severe orthopnea ongoing for 2 to 3 weeks.  Likely new diagnosis of acute on chronic Diastolic CHF - EF [id number] -  stopped IV steroids has diuresed well with IV Lasix hold further Lasix, question of pneumonia as procalcitonin is borderline with questionable left lower lobe infiltrate, stable noncontrast CT, continue empiric Levaquin x 3 days   2.  Hypertension.  Blood pressure medications adjusted on 05/26/2021 for better control we will continue to monitor.  3.  Dyslipidemia.  On statin continue.  4. ? L.  Infiltrate.  Has finished out patient Z-Pak and steroid course, will give short dose of oral Levaquin and monitor.  No  productive cough.  Will trend leukocyte count and procalcitonin.  5.  AKI.  Gentle hydration.      Condition - Fair  Family Communication  : None present  Code Status : Full  Consults  : None  PUD Prophylaxis : None   Procedures  :     CT -  Bilateral lung opacities noted on prior exam have significantly improved. Minimal bibasilar scarring is noted as well as right apical scarring. No definite acute pulmonary parenchymal abnormality is noted. Mild coronary artery calcifications are noted. Aortic Atherosclerosis   Echocardiogram - 1. Left ventricular ejection fraction, by estimation, is 55 to 60%. The left ventricle has normal function. The left ventricle has no regional wall motion abnormalities. There is mild concentric left ventricular hypertrophy. Left ventricular diastolic parameters are consistent with Grade I diastolic dysfunction (impaired relaxation).  2. Right ventricular systolic function is normal. The right ventricular size is normal. There is severely elevated pulmonary artery systolic pressure.  3. The mitral valve is normal in structure. No evidence of mitral valve regurgitation. No evidence of mitral stenosis.  4. The aortic valve is tricuspid. Aortic valve regurgitation is mild. No aortic stenosis is present.  5. The inferior vena cava is normal in size with greater than 50% respiratory variability, suggesting right atrial pressure of 3 mmHg.      Disposition Plan  :    Status is: Observation  Dispo: The patient is from: Home              Anticipated d/c is to: Home              Patient currently is not medically stable to d/c.   Difficult to place patient No   DVT Prophylaxis  :    enoxaparin (LOVENOX) injection 40 mg Start: 05/25/21 1800  Lab Results  Component Value Date   PLT 215 05/28/2021    Diet :  Diet Order             Diet Heart Room service appropriate? Yes; Fluid consistency: Thin  Diet effective now                    Inpatient  Medications  Scheduled Meds:  amLODipine  5 mg Oral Daily   atorvastatin  80 mg Oral q1800   carvedilol  6.25 mg Oral BID WC   clopidogrel  75 mg Oral Daily   DULoxetine  30 mg Oral Daily   enoxaparin (LOVENOX) injection  40 mg Subcutaneous Q24H   furosemide  40 mg Oral Once   guaiFENesin  600 mg Oral BID   insulin aspart  0-9 Units Subcutaneous TID WC   ipratropium  0.5 mg Nebulization BID   isosorbide mononitrate  30 mg Oral Daily   loratadine  10 mg Oral Daily   mometasone-formoterol  2 puff Inhalation BID   predniSONE  50 mg Oral Once   Continuous Infusions:   PRN Meds:.acetaminophen **OR** acetaminophen, albuterol, bisacodyl, chlorpheniramine-HYDROcodone, clonazePAM, nitroGLYCERIN, senna-docusate, traZODone  Antibiotics  :    Anti-infectives (From admission, onward)    Start     Dose/Rate Route Frequency Ordered Stop   05/26/21 1300  levofloxacin (LEVAQUIN) tablet 500 mg        500 mg Oral Daily 05/26/21 1202 05/28/21 0944        Time Spent in minutes  30   [name] M.D on 05/28/2021 at 11:36 AM  To page go to [email or web address]   [facility] -  Office  [phone]    See all Orders from today for further details    Objective:   Vitals:   05/27/21 2117 05/28/21 0012 05/28/21 0633 05/28/21 0758  BP:  (!) 112/55 131/62 (!) 155/71  Pulse:  61 80 89  Resp:  '16 16 17  '$ Temp:  97.9 F (36.6 C)  98 F (36.7 C) (!) 97.5 F (36.4 C)  TempSrc:  Oral Oral Oral  SpO2: 90% 90% 92% 90%  Weight:      Height:        Wt Readings from Last 3 Encounters:  05/25/21 71.9 kg  02/03/21 69.9 kg  11/19/20 70.3 kg     Intake/Output Summary (Last 24 hours) at 05/28/2021 1136 Last data filed at 05/28/2021 [id number] Gross per 24 hour  Intake 1155.34 ml  Output --  Net 1155.34 ml     Physical Exam  Awake Alert, No new F.N deficits, Normal affect [place].AT,PERRAL Supple Neck,No JVD, No cervical lymphadenopathy appriciated.  Symmetrical Chest wall movement, Good  air movement bilaterally, few rales RRR,No Gallops, Rubs or new Murmurs, No Parasternal Heave +ve B.Sounds, Abd Soft, No tenderness, No organomegaly appriciated, No rebound - guarding or rigidity. No Cyanosis, Clubbing or edema, No new Rash or bruise     Data Review:    CBC Recent Labs  Lab 05/25/21 0759 05/26/21 0029 05/27/21 0042 05/28/21 0132  WBC 10.7* 9.1 9.9 8.0  HGB 12.9 12.9 12.3 11.6*  HCT 36.3 37.4 35.9* 34.5*  PLT 254 224 245 215  MCV 85.2 86.0 87.1 87.6  MCH 30.3 29.7 29.9 29.4  MCHC 35.5 34.5 34.3 33.6  RDW 13.5 13.9 13.3 13.5  LYMPHSABS 3.9  --  0.8 2.0  MONOABS 0.9  --  0.6 0.5  EOSABS 0.1  --  0.3 0.2  BASOSABS 0.1  --  0.0 0.0    Recent Labs  Lab 05/25/21 0759 05/26/21 0029 05/27/21 0042 05/28/21 0132  NA 135 135 131* 131*  K 2.9* 4.1 3.9 3.8  CL 98 98 97* 96*  CO2 '25 28 24 25  '$ GLUCOSE 326* 205* 252* 269*  BUN 18 12 33* 29*  CREATININE 0.87 0.73 1.32* 1.05*  CALCIUM 9.3 8.9 9.0 8.6*  AST  --   --  16 14*  ALT  --   --  22 17  ALKPHOS  --   --  71 61  BILITOT  --   --  0.8 0.9  ALBUMIN  --   --  3.2* 2.8*  MG 1.6*  --  1.9 1.8  PROCALCITON  --  <0.10 0.16 <0.10  HGBA1C  --  7.0*  --   --   BNP  --  83.1 66.3 25.5    ------------------------------------------------------------------------------------------------------------------ No results for input(s): CHOL, HDL, LDLCALC, TRIG, CHOLHDL, LDLDIRECT in the last 72 hours.  Lab Results  Component Value Date   HGBA1C 7.0 (H) 05/26/2021   ------------------------------------------------------------------------------------------------------------------ No results for input(s): TSH, T4TOTAL, T3FREE, THYROIDAB in the last 72 hours.  Invalid input(s): FREET3  Cardiac Enzymes No results for input(s): CKMB, TROPONINI, MYOGLOBIN in the last 168 hours.  Invalid input(s): CK ------------------------------------------------------------------------------------------------------------------     Component Value Date/Time   BNP 25.5 05/28/2021 0132    Radiology Reports CT CHEST WO CONTRAST  Result Date: 05/27/2021 CLINICAL DATA:  Pneumonia. EXAM: CT CHEST WITHOUT CONTRAST TECHNIQUE: Multidetector CT imaging of the chest was performed following the standard protocol without IV contrast. COMPARISON:  May 26, 2021.  November 19, 2020. FINDINGS: Cardiovascular: Atherosclerosis of thoracic aorta is noted without aneurysm formation. Normal cardiac size. No pericardial effusion. Mild coronary artery calcifications are noted. Mediastinum/Nodes: No enlarged mediastinal or axillary lymph nodes. Thyroid gland, trachea, and esophagus demonstrate no significant findings. Lungs/Pleura: No pneumothorax or pleural effusion is noted. Stable right apical scarring is noted. Minimal bibasilar scarring is noted. No acute  abnormality is noted. Upper Abdomen: No acute abnormality. Musculoskeletal: No chest wall mass or suspicious bone lesions identified. IMPRESSION: Bilateral lung opacities noted on prior exam have significantly improved. Minimal bibasilar scarring is noted as well as right apical scarring. No definite acute pulmonary parenchymal abnormality is noted. Mild coronary artery calcifications are noted. Aortic Atherosclerosis (ICD10-I70.0). Electronically Signed   By: Marijo Conception M.D.   On: 05/27/2021 13:37   DG Chest Port 1 View  Result Date: 05/26/2021 CLINICAL DATA:  Shortness of breath EXAM: PORTABLE CHEST 1 VIEW COMPARISON:  Yesterday FINDINGS: Normal heart size and mediastinal contours. Vague hazy density in the left mid to lower chest. No Kerley lines, effusion, or pneumothorax. Postoperative right axilla and breast. IMPRESSION: 1. Equivocal early infiltrate in the left lung. 2. No cardiomegaly or pulmonary edema. Electronically Signed   By: Jorje Guild M.D.   On: 05/26/2021 07:58   DG Chest Port 1 View  Result Date: 05/25/2021 CLINICAL DATA:  Shortness of breath EXAM: PORTABLE CHEST 1  VIEW COMPARISON:  11/19/2020 FINDINGS: Normal heart size and mediastinal contours. Reticulation of lung markings which is chronic and greatest at the right apex where there is some degree of presumed radiation fibrosis. Postoperative right breast and axilla. No acute osseous finding. IMPRESSION: Stable chest.  No acute finding. Electronically Signed   By: Jorje Guild M.D.   On: 05/25/2021 07:47   MM 3D SCREEN BREAST UNI LEFT  Result Date: 05/13/2021 CLINICAL DATA:  Screening. EXAM: DIGITAL SCREENING UNILATERAL LEFT MAMMOGRAM WITH CAD AND TOMOSYNTHESIS TECHNIQUE: Left screening digital craniocaudal and mediolateral oblique mammograms were obtained. Left screening digital breast tomosynthesis was performed. The images were evaluated with computer-aided detection. COMPARISON:  Previous exam(s). ACR Breast Density Category c: The breast tissue is heterogeneously dense, which may obscure small masses. FINDINGS: The patient has had a right mastectomy. There are no findings suspicious for malignancy. IMPRESSION: No mammographic evidence of malignancy. A result letter of this screening mammogram will be mailed directly to the patient. RECOMMENDATION: Screening mammogram in one year.  (Code:SM-L-5M) BI-RADS CATEGORY  1: Negative. Electronically Signed   By: Margarette Canada M.D.   On: 05/13/2021 15:26   ECHOCARDIOGRAM COMPLETE  Result Date: 05/26/2021    ECHOCARDIOGRAM REPORT   Patient Name:   ANNELI KLAGES Date of Exam: 05/26/2021 Medical Rec #:  [id number]     Height:       65.0 in Accession #:    [id number]    Weight:       158.5 lb Date of Birth:  [birth year]      BSA:          1.792 m Patient Age:    9 years      BP:           173/62 mmHg Patient Gender: F             HR:           79 bpm. Exam Location:  Inpatient Procedure: 2D Echo, Cardiac Doppler and Color Doppler Indications:    I50.30* Unspecified diastolic (congestive) heart failure  History:        Patient has prior history of Echocardiogram examinations,  most                 recent 05/23/2019. Signs/Symptoms:Chest Pain; Risk                 Factors:Hypertension.  Sonographer:    MH Referring Phys: [place] Simpson  1. Left ventricular ejection fraction, by estimation, is 55 to 60%. The left ventricle has normal function. The left ventricle has no regional wall motion abnormalities. There is mild concentric left ventricular hypertrophy. Left ventricular diastolic parameters are consistent with Grade I diastolic dysfunction (impaired relaxation).  2. Right ventricular systolic function is normal. The right ventricular size is normal. There is severely elevated pulmonary artery systolic pressure.  3. The mitral valve is normal in structure. No evidence of mitral valve regurgitation. No evidence of mitral stenosis.  4. The aortic valve is tricuspid. Aortic valve regurgitation is mild. No aortic stenosis is present.  5. The inferior vena cava is normal in size with greater than 50% respiratory variability, suggesting right atrial pressure of 3 mmHg. FINDINGS  Left Ventricle: Left ventricular ejection fraction, by estimation, is 55 to 60%. The left ventricle has normal function. The left ventricle has no regional wall motion abnormalities. The left ventricular internal cavity size was normal in size. There is  mild concentric left ventricular hypertrophy. Left ventricular diastolic parameters are consistent with Grade I diastolic dysfunction (impaired relaxation). Indeterminate filling pressures. Right Ventricle: The right ventricular size is normal. No increase in right ventricular wall thickness. Right ventricular systolic function is normal. There is severely elevated pulmonary artery systolic pressure. The tricuspid regurgitant velocity is 9.00 m/s, and with an assumed right atrial pressure of 3 mmHg, the estimated right ventricular systolic pressure is [id number] mmHg. Left Atrium: Left atrial size was normal in size. Right Atrium: Right atrial size was  normal in size. Pericardium: There is no evidence of pericardial effusion. Mitral Valve: The mitral valve is normal in structure. No evidence of mitral valve regurgitation. No evidence of mitral valve stenosis. Tricuspid Valve: The tricuspid valve is normal in structure. Tricuspid valve regurgitation is trivial. No evidence of tricuspid stenosis. Aortic Valve: The aortic valve is tricuspid. Aortic valve regurgitation is mild. Aortic regurgitation PHT measures 662 msec. No aortic stenosis is present. Aortic valve mean gradient measures 2.5 mmHg. Aortic valve peak gradient measures 3.4 mmHg. Aortic  valve area, by VTI measures 2.59 cm. Pulmonic Valve: The pulmonic valve was normal in structure. Pulmonic valve regurgitation is trivial. No evidence of pulmonic stenosis. Aorta: The aortic root is normal in size and structure. Venous: The inferior vena cava is normal in size with greater than 50% respiratory variability, suggesting right atrial pressure of 3 mmHg. IAS/Shunts: No atrial level shunt detected by color flow Doppler.  LEFT VENTRICLE PLAX 2D LVIDd:         4.10 cm     Diastology LVIDs:         2.90 cm     LV e' medial:    4.13 cm/s LV PW:         1.20 cm     LV E/e' medial:  13.6 LV IVS:        1.30 cm     LV e' lateral:   6.09 cm/s LVOT diam:     2.00 cm     LV E/e' lateral: 9.2 LV SV:         52 LV SV Index:   29 LVOT Area:     3.14 cm  LV Volumes (MOD) LV vol d, MOD A4C: 31.7 ml LV vol s, MOD A4C: 14.8 ml LV SV MOD A4C:     31.7 ml RIGHT VENTRICLE            IVC RV S prime:     9.79 cm/s  IVC diam: 1.80 cm TAPSE (M-mode): 2.3 cm LEFT ATRIUM             Index       RIGHT ATRIUM          Index LA diam:        2.70 cm 1.51 cm/m  RA Area:     6.10 cm LA Vol (A2C):   33.3 ml 18.58 ml/m RA Volume:   7.20 ml  4.02 ml/m LA Vol (A4C):   20.0 ml 11.16 ml/m LA Biplane Vol: 27.7 ml 15.46 ml/m  AORTIC VALVE                   PULMONIC VALVE AV Area (Vmax):    2.56 cm    PV Vmax:       0.40 m/s AV Area (Vmean):    2.49 cm    PV Peak grad:  0.6 mmHg AV Area (VTI):     2.59 cm AV Vmax:           92.65 cm/s AV Vmean:          70.400 cm/s AV VTI:            0.201 m AV Peak Grad:      3.4 mmHg AV Mean Grad:      2.5 mmHg LVOT Vmax:         75.50 cm/s LVOT Vmean:        55.800 cm/s LVOT VTI:          0.166 m LVOT/AV VTI ratio: 0.83 AI PHT:            662 msec  AORTA Ao Root diam: 2.70 cm Ao Asc diam:  2.90 cm MITRAL VALVE                TRICUSPID VALVE MV Area (PHT): 4.78 cm     TR Peak grad:   324.0 mmHg MV E velocity: 56.30 cm/s   TR Vmax:        900.00 cm/s MV A velocity: 108.00 cm/s MV E/A ratio:  0.52         SHUNTS                             Systemic VTI:  0.17 m                             Systemic Diam: 2.00 cm Skeet Latch MD Electronically signed by Skeet Latch MD Signature Date/Time: 05/26/2021/2:12:30 PM    Final

## 2021-05-28 NOTE — Progress Notes (Signed)
Occupational Therapy Treatment Patient Details Name: [name] MRN: [id number] DOB: Nov 05, 1940 Today's Date: 05/28/2021   History of present illness 80yo female who presented on 9/13 with SOB, cough, and wheezing. Covid negative. Admited with acute asthma exacerbation secondary to bronchitis. PMH breast CA s/p mastectomy, HLD, HTN, CVA, cholecystectomy   OT comments  Patient progressing and showed improved ability to verbalize energy conservation plan with use of AE/DME as needed and utilizing the "4 Ps" of E.C to maximize function and quality of life at home. Patient remains limited by impaired activity tolerance, but now able to verbalize how to pace self, importance of rest breaks, and use of rollator when needed to avoid over-fatigue and exacerbation of symptoms.   Pt continues to demonstrate good rehab potential and may  benefit from 1 additional skilled OT visit to reinforce all concepts from today and give opportunity for Q&A.    Recommendations for follow up therapy are one component of a multi-disciplinary discharge planning process, led by the attending physician.  Recommendations may be updated based on patient status, additional functional criteria and insurance authorization.    Follow Up Recommendations  No OT follow up;Supervision - Intermittent    Equipment Recommendations  Tub/shower seat    Recommendations for Other Services      Precautions / Restrictions Precautions Precautions: Other (comment) Precaution Comments: monitor O2 sats and SOB Restrictions Weight Bearing Restrictions: No       Mobility Bed Mobility Overal bed mobility: Independent                  Transfers Overall transfer level: Independent               General transfer comment: Pt ambulated in room without AD, tolerating 1 lap from EOB to door ~15' x 2, then required sitting rest break.  As endurance test, after sitting rest break, pt then used RW as substitue for her rollator  at home and performed same lap of 15' x2 with RW and reported significantly less fatigue with use of RW. Pt verbalized plan to use rollator at home to [facility] rooms and long hallways. Pt educated on rollator safety with focus on locking breaks apprpriately prior to sitting or standing. Pt and spouse verbalized understanding.    Balance Overall balance assessment: Independent                                         ADL either performed or assessed with clinical judgement   ADL Overall ADL's : Modified independent                                       General ADL Comments: Pt reports no problems with ADLs except with fatigue. Pt identified bathing (pt get all the way down into a "deep tub", grocery shopping, walking through her home and cooking as primary sources of overfatigue.  Session speand educating pt and spouse on energy conservation strategies with use of handouts to reinforce.  Collborating with pt and spouse to solve problems, pt able to verbalize energy conservation strategies that she plans to use at home to avoid over-fatigue. Plans incude using a Rollator and reacher that are already at the house, keeping a bar stool in kitchen to avoid prolonged standing, considering use of walk in  shower with built in bench to sit and use of liong handled shower chair.  Discussed the "4 Ps" and pt able to give examples with grocery shopping on ways to pace herself including standing rest breaks or use of grocery scooter.  Pt educated on delivery services as well, but pt  expressed enjoying going to grocery store in person. Pt was educated on the RPE as as a gauge to help judge when to rest. Pt very receptive to all education with good interaction and planning strategies.     Vision Baseline Vision/History: 1 Wears glasses Ability to See in Adequate Light: 0 Adequate Patient Visual Report: No change from baseline Vision Assessment?: No apparent visual  deficits   Perception     Praxis      Cognition Arousal/Alertness: Awake/alert Behavior During Therapy: WFL for tasks assessed/performed Overall Cognitive Status: Within Functional Limits for tasks assessed                                          Exercises     Shoulder Instructions       General Comments      Pertinent Vitals/ Pain       Pain Assessment: No/denies pain  Home Living                                          Prior Functioning/Environment              Frequency  Min 2X/week (1 more session if pt does not discharge to allow for ECT questions, discharge planning.)        Progress Toward Goals  OT Goals(current goals can now be found in the care plan section)  Progress towards OT goals: Progressing toward goals  Acute Rehab OT Goals Patient Stated Goal: Be able to walk house and grocery store without becoming short of breath. OT Goal Formulation: With patient/family Time For Goal Achievement: 06/09/21 Potential to Achieve Goals: Good  Plan Discharge plan remains appropriate    Co-evaluation                 AM-PAC OT "6 Clicks" Daily Activity     Outcome Measure   Help from another person eating meals?: None Help from another person taking care of personal grooming?: None Help from another person toileting, which includes using toliet, bedpan, or urinal?: None Help from another person bathing (including washing, rinsing, drying)?: A Little Help from another person to put on and taking off regular upper body clothing?: None Help from another person to put on and taking off regular lower body clothing?: None 6 Click Score: 23    End of Session Equipment Utilized During Treatment: Rolling walker (see note)  OT Visit Diagnosis: Unsteadiness on feet (R26.81);Other (comment)   Activity Tolerance Patient tolerated treatment well;Patient limited by fatigue   Patient Left with family/visitor  present;with call bell/phone within reach;with nursing/sitter in room (Sitting EOB)   Nurse Communication Mobility status        Time: [id number] OT Time Calculation (min): 34 min  Charges: OT General Charges $OT Visit: 1 Visit OT Treatments $Self Care/Home Management : 8-22 mins $Therapeutic Activity: 8-22 mins  Anderson Malta, OT Acute Rehab Services Office: [phone] 05/28/2021  Julien Girt 05/28/2021, 10:30 AM

## 2021-05-28 NOTE — Progress Notes (Signed)
Inpatient Diabetes Program Recommendations  AACE/ADA: New Consensus Statement on Inpatient Glycemic Control (2015)  Target Ranges:  Prepandial:   less than 140 mg/dL      Peak postprandial:   less than 180 mg/dL (1-2 hours)      Critically ill patients:  140 - 180 mg/dL   Lab Results  Component Value Date   GLUCAP 232 (H) 05/28/2021   HGBA1C 7.0 (H) 05/26/2021    Review of Glycemic Control  Diabetes history: No hx Outpatient Diabetes medications: None Current orders for Inpatient glycemic control: None  HgbA1C - 7% Steroid-induced hyperglycemia  Inpatient Diabetes Program Recommendations:    Consider addition of Novolog 0-6 TID with meals.  F/U with PCP regarding glucose control and HgbA1C of 7%.  Thank you. Lorenda Peck, RD, LDN, CDE Inpatient Diabetes Coordinator [phone]

## 2021-05-29 ENCOUNTER — Inpatient Hospital Stay (HOSPITAL_COMMUNITY): Payer: Medicare Other

## 2021-05-29 DIAGNOSIS — R0682 Tachypnea, not elsewhere classified: Secondary | ICD-10-CM | POA: Diagnosis not present

## 2021-05-29 DIAGNOSIS — R0602 Shortness of breath: Secondary | ICD-10-CM | POA: Diagnosis not present

## 2021-05-29 DIAGNOSIS — R058 Other specified cough: Secondary | ICD-10-CM

## 2021-05-29 LAB — COMPREHENSIVE METABOLIC PANEL
ALT: 20 U/L (ref 0–44)
AST: 15 U/L (ref 15–41)
Albumin: 3.2 g/dL — ABNORMAL LOW (ref 3.5–5.0)
Alkaline Phosphatase: 76 U/L (ref 38–126)
Anion gap: 10 (ref 5–15)
BUN: 24 mg/dL — ABNORMAL HIGH (ref 8–23)
CO2: 27 mmol/L (ref 22–32)
Calcium: 9.2 mg/dL (ref 8.9–10.3)
Chloride: 98 mmol/L (ref 98–111)
Creatinine, Ser: 1.15 mg/dL — ABNORMAL HIGH (ref 0.44–1.00)
GFR, Estimated: 48 mL/min — ABNORMAL LOW (ref >60)
Glucose, Bld: 289 mg/dL — ABNORMAL HIGH (ref 70–99)
Potassium: 4.5 mmol/L (ref 3.5–5.1)
Sodium: 135 mmol/L (ref 135–145)
Total Bilirubin: 0.9 mg/dL (ref 0.3–1.2)
Total Protein: 6.2 g/dL — ABNORMAL LOW (ref 6.5–8.1)

## 2021-05-29 LAB — CBC WITH DIFFERENTIAL/PLATELET
Abs Immature Granulocytes: 0.1 10*3/uL — ABNORMAL HIGH (ref 0.00–0.07)
Basophils Absolute: 0 10*3/uL (ref 0.0–0.1)
Basophils Relative: 0 %
Eosinophils Absolute: 0 10*3/uL (ref 0.0–0.5)
Eosinophils Relative: 0 %
HCT: 37.9 % (ref 36.0–46.0)
Hemoglobin: 13.1 g/dL (ref 12.0–15.0)
Immature Granulocytes: 1 %
Lymphocytes Relative: 10 %
Lymphs Abs: 0.7 10*3/uL (ref 0.7–4.0)
MCH: 30 pg (ref 26.0–34.0)
MCHC: 34.6 g/dL (ref 30.0–36.0)
MCV: 86.9 fL (ref 80.0–100.0)
Monocytes Absolute: 0.2 10*3/uL (ref 0.1–1.0)
Monocytes Relative: 2 %
Neutro Abs: 6.7 10*3/uL (ref 1.7–7.7)
Neutrophils Relative %: 87 %
Platelets: 234 10*3/uL (ref 150–400)
RBC: 4.36 MIL/uL (ref 3.87–5.11)
RDW: 13.2 % (ref 11.5–15.5)
WBC: 7.7 10*3/uL (ref 4.0–10.5)
nRBC: 0 % (ref 0.0–0.2)

## 2021-05-29 LAB — MAGNESIUM: Magnesium: 1.9 mg/dL (ref 1.7–2.4)

## 2021-05-29 LAB — GLUCOSE, CAPILLARY
Glucose-Capillary: 254 mg/dL — ABNORMAL HIGH (ref 70–99)
Glucose-Capillary: 336 mg/dL — ABNORMAL HIGH (ref 70–99)
Glucose-Capillary: 408 mg/dL — ABNORMAL HIGH (ref 70–99)
Glucose-Capillary: 323 mg/dL — ABNORMAL HIGH (ref 70–99)

## 2021-05-29 LAB — BRAIN NATRIURETIC PEPTIDE: B Natriuretic Peptide: 44.9 pg/mL (ref 0.0–100.0)

## 2021-05-29 MED ORDER — PREDNISONE 20 MG PO TABS
40.0000 mg | ORAL_TABLET | Freq: Once | ORAL | Status: AC
  Administered 2021-05-29: 40 mg via ORAL
  Filled 2021-05-29: qty 2

## 2021-05-29 MED ORDER — LEVOFLOXACIN 500 MG PO TABS: 500.0000 mg | ORAL_TABLET | Freq: Every day | ORAL | 0 refills | Status: DC

## 2021-05-29 MED ORDER — MONTELUKAST SODIUM 10 MG PO TABS: 10.0000 mg | ORAL_TABLET | Freq: Every day | ORAL | 0 refills | Status: AC

## 2021-05-29 MED ORDER — FUROSEMIDE 40 MG PO TABS
40.0000 mg | ORAL_TABLET | Freq: Once | ORAL | Status: AC
  Administered 2021-05-29: 40 mg via ORAL
  Filled 2021-05-29: qty 1

## 2021-05-29 MED ORDER — TECHNETIUM TO 99M ALBUMIN AGGREGATED
4.2000 | Freq: Once | INTRAVENOUS | Status: AC | PRN
  Administered 2021-05-29: 4.2 via INTRAVENOUS

## 2021-05-29 MED ORDER — FUROSEMIDE 40 MG PO TABS: 40.0000 mg | ORAL_TABLET | Freq: Every day | ORAL | 0 refills | Status: DC | PRN

## 2021-05-29 MED ORDER — ALBUTEROL SULFATE HFA 108 (90 BASE) MCG/ACT IN AERS: 1.0000 | INHALATION_SPRAY | RESPIRATORY_TRACT | 0 refills | Status: DC | PRN

## 2021-05-29 MED ORDER — METHYLPREDNISOLONE 4 MG PO TBPK: ORAL_TABLET | ORAL | 0 refills | Status: DC

## 2021-05-29 MED ORDER — CARVEDILOL 6.25 MG PO TABS: 6.2500 mg | ORAL_TABLET | Freq: Two times a day (BID) | ORAL | 0 refills | Status: DC

## 2021-05-29 MED ORDER — GUAIFENESIN ER 600 MG PO TB12: 600.0000 mg | ORAL_TABLET | Freq: Two times a day (BID) | ORAL | 0 refills | Status: AC | PRN

## 2021-05-29 MED ORDER — LEVOFLOXACIN 500 MG PO TABS
500.0000 mg | ORAL_TABLET | Freq: Once | ORAL | Status: AC
  Administered 2021-05-29: 500 mg via ORAL
  Filled 2021-05-29: qty 1

## 2021-05-29 NOTE — Discharge Summary (Signed)
[name] PFX:[id number] DOB: 1941/02/16 DOA: 05/25/2021  PCP: Alroy Dust, L.Marlou Sa, MD  Admit date: 05/25/2021  Discharge date: 05/30/2021  Admitted From: Home  Disposition:  Home   Recommendations for Outpatient Follow-up:   Follow up with PCP in 1-2 weeks  PCP Please obtain BMP/CBC, 2 view CXR in 1week,  (see Discharge instructions)   PCP Please follow up on the following pending results: Monitor blood pressure, CBC, CMP, magnesium glycemic control closely.   Home Health:  None Equipment/Devices: None  Consultations: None  Discharge Condition: Stable    CODE STATUS: Full    Diet Recommendation: Heart Healthy - Low Carb  Diet Order             Diet Heart Room service appropriate? Yes; Fluid consistency: Thin  Diet effective now                    Chief Complaint  Patient presents with   Shortness of Breath     Brief history of present illness from the day of admission and additional interim summary    FAITHLYN [name] is a 80 y.o. female with medical history significant of asthma, HTN, Hx of CVA, PUD, GERD, breast cancer who presented to [facility] with persistent shortness of breath, cough and wheezing.  She had been treated as outpatient for bronchitis with a Z-pack and prednisone, but failed to improve, she was admitted to the hospital with ongoing shortness of breath with running diagnosis of asthma exacerbation.  Further work-up suggested that she had newly found CHF.                                                                 Hospital Course    Acute Hypoxic Resp. Failure severe orthopnea ongoing for 2 to 3 weeks.  Likely new diagnosis of acute on chronic Diastolic CHF - EF 32% -  has diuresed well with IV Lasix , euvolemic, now on PRN PO Lasix, question of pneumonia as procalcitonin was borderline  with questionable left lower lobe infiltrate, stable noncontrast CT, was given short PO Levaquin course, now symptom free, post DC follow with PCP and Cards.    2.  Hypertension. On Coreg + Norvasc and much better.   3.  Dyslipidemia.  On statin continue.   4. ? L.  Infiltrate.  Has finished out patient Z-Pak and steroid course, treat with short course of Levaquin and 2 doses of steroid with good benefit, symptom-free, PCP to repeat two-view chest x-ray in 7 to 10 days.   5.  AKI.  Gentle hydration, resolved.   6. ++ PA pressure on TTE, different from 2018 , had a negative VQ scan, symptom-free now will have an outpatient follow-up with pulmonary post DC.  7. New DM 2 - educated, PO Meds +  testing supplies, A1c -7      Discharge diagnosis     Principal Problem:   Asthma exacerbation Active Problems:   G E R D   Upper airway cough syndrome   PUD (peptic ulcer disease)   HTN (hypertension)   CVA (cerebral vascular accident) ([place])   HLD (hyperlipidemia)   Bronchitis    Discharge instructions    Discharge Instructions     Discharge instructions   Complete by: As directed    Follow with Primary MD Alroy Dust, L.Marlou Sa, MD and your Cardiologist in 7 days   Get CBC, CMP, Magnesium, 2 view Chest X ray -  checked next visit within 1 week by Primary MD    Activity: As tolerated with Full fall precautions use walker/cane & assistance as needed  Disposition Home   Diet: Heart Healthy with strict 1.5 lit/day total fluid restriction, Low Carb  Check your Weight same time everyday, if you gain over 2 pounds, or you develop in leg swelling, experience more shortness of breath or chest pain, call your Primary MD immediately. Follow Cardiac Low Salt Diet and 1.5 lit/day fluid restriction.  Accuchecks 4 times/day, Once in AM empty stomach and then before each meal. Log in all results and show them to your Prim.MD in 3 days. If any glucose reading is under 80 or above 300 call your Prim  MD immidiately. Follow Low glucose instructions for glucose under 80 as instructed.  Special Instructions: If you have smoked or chewed Tobacco  in the last 2 yrs please stop smoking, stop any regular Alcohol  and or any Recreational drug use.  On your next visit with your primary care physician please Get Medicines reviewed and adjusted.  Please request your Prim.MD to go over all Hospital Tests and Procedure/Radiological results at the follow up, please get all Hospital records sent to your Prim MD by signing hospital release before you go home.  If you experience worsening of your admission symptoms, develop shortness of breath, life threatening emergency, suicidal or homicidal thoughts you must seek medical attention immediately by calling 911 or calling your MD immediately  if symptoms less severe.  You Must read complete instructions/literature along with all the possible adverse reactions/side effects for all the Medicines you take and that have been prescribed to you. Take any new Medicines after you have completely understood and accpet all the possible adverse reactions/side effects.   Increase activity slowly   Complete by: As directed        Discharge Medications   Allergies as of 05/30/2021       Reactions   Codeine Nausea And Vomiting   Doxycycline Nausea And Vomiting   REACTION: GI upset   Percocet [oxycodone-acetaminophen] Nausea And Vomiting   Tramadol    Other reaction(s): Other (See Comments)        Medication List     STOP taking these medications    azithromycin 250 MG tablet Commonly known as: ZITHROMAX   colchicine 0.6 MG tablet   metoprolol tartrate 25 MG tablet Commonly known as: LOPRESSOR   nortriptyline 10 MG capsule Commonly known as: PAMELOR   predniSONE 20 MG tablet Commonly known as: DELTASONE       TAKE these medications    albuterol 108 (90 Base) MCG/ACT inhaler Commonly known as: VENTOLIN HFA Inhale 1-2 puffs into the lungs  every 4 (four) hours as needed for wheezing or shortness of breath.   amLODipine 5 MG tablet Commonly known as: NORVASC Take 5 mg  by mouth daily.   atorvastatin 80 MG tablet Commonly known as: LIPITOR Take 1 tablet (80 mg total) by mouth daily at 6 PM.   blood glucose meter kit and supplies Kit Dispense based on patient and insurance preference. Use up to four times daily as directed. (FOR ICD-9 250.00, 250.01). For QAC - HS accuchecks.   carvedilol 6.25 MG tablet Commonly known as: COREG Take 1 tablet (6.25 mg total) by mouth 2 (two) times daily with a meal.   clobetasol cream 0.05 % Commonly known as: TEMOVATE Apply 1 application topically daily at 6 (six) AM.   clonazePAM 1 MG tablet Commonly known as: KLONOPIN Take 1 mg by mouth 2 (two) times daily as needed for anxiety.   clopidogrel 75 MG tablet Commonly known as: PLAVIX Take 1 tablet (75 mg total) by mouth daily.   DULoxetine 30 MG capsule Commonly known as: CYMBALTA Take 30 mg by mouth daily.   Fluticasone-Salmeterol 250-50 MCG/DOSE Aepb Commonly known as: ADVAIR Inhale 1 puff into the lungs 2 (two) times daily.   furosemide 40 MG tablet Commonly known as: Lasix Take 1 tablet (40 mg total) by mouth daily as needed for fluid or edema (If your weight is above 2.5 pounds from your baseline weight, weigh yourself daily at the same time).   glipiZIDE 5 MG tablet Commonly known as: GLUCOTROL Take 0.5 tablets (2.5 mg total) by mouth daily before breakfast. Start taking on: May 31, 2021   guaiFENesin 600 MG 12 hr tablet Commonly known as: MUCINEX Take 1 tablet (600 mg total) by mouth 2 (two) times daily as needed for up to 7 days for cough.   isosorbide mononitrate 30 MG 24 hr tablet Commonly known as: IMDUR TAKE 1 TABLET BY MOUTH  DAILY   loratadine 10 MG tablet Commonly known as: CLARITIN Take 10 mg by mouth daily.   metFORMIN 500 MG tablet Commonly known as: GLUCOPHAGE Take 1 tablet (500 mg total)  by mouth 2 (two) times daily with a meal.   montelukast 10 MG tablet Commonly known as: SINGULAIR Take 1 tablet (10 mg total) by mouth at bedtime.   nitroGLYCERIN 0.4 MG SL tablet Commonly known as: NITROSTAT Place 1 tablet (0.4 mg total) under the tongue every 5 (five) minutes as needed for chest pain.         Follow-up Information     Alroy Dust, L.Marlou Sa, MD. Schedule an appointment as soon as possible for a visit in 1 week(s).   Specialty: Family Medicine Contact information: [address] [place] [address] [phone]         [name], Wonda Cheng, MD. Schedule an appointment as soon as possible for a visit in 1 week(s).   Specialty: Cardiology Contact information: [address] [place] [address] [phone]         Garner Nash, DO. Schedule an appointment as soon as possible for a visit in 1 week(s).   Specialty: Pulmonary Disease Why: high PA pressure Contact information: [address] [place] [address] [phone]                 Major procedures and Radiology Reports - PLEASE review detailed and final reports thoroughly  -       CT CHEST WO CONTRAST  Result Date: 05/27/2021 CLINICAL DATA:  Pneumonia. EXAM: CT CHEST WITHOUT CONTRAST TECHNIQUE: Multidetector CT imaging of the chest was performed following the standard protocol without IV contrast. COMPARISON:  May 26, 2021.  November 19, 2020. FINDINGS: Cardiovascular: Atherosclerosis of thoracic aorta is noted without aneurysm formation. Normal cardiac size. No pericardial effusion. Mild coronary artery calcifications are noted. Mediastinum/Nodes: No enlarged mediastinal or axillary lymph nodes. Thyroid gland, trachea, and esophagus demonstrate no significant findings. Lungs/Pleura: No pneumothorax or pleural effusion is noted. Stable right apical scarring is noted. Minimal bibasilar scarring is noted. No acute abnormality is noted. Upper  Abdomen: No acute abnormality. Musculoskeletal: No chest wall mass or suspicious bone lesions identified. IMPRESSION: Bilateral lung opacities noted on prior exam have significantly improved. Minimal bibasilar scarring is noted as well as right apical scarring. No definite acute pulmonary parenchymal abnormality is noted. Mild coronary artery calcifications are noted. Aortic Atherosclerosis (ICD10-I70.0). Electronically Signed   By: Marijo Conception M.D.   On: 05/27/2021 13:37   NM Pulmonary Perfusion  Result Date: 05/29/2021 CLINICAL DATA:  80 year old female with shortness of breath and cough. EXAM: NUCLEAR MEDICINE PERFUSION LUNG SCAN TECHNIQUE: Perfusion images were obtained in multiple projections after intravenous injection of radiopharmaceutical. Ventilation scans intentionally deferred if perfusion scan and chest x-ray adequate for interpretation during COVID 19 epidemic. RADIOPHARMACEUTICALS:  4.2 mCi Tc-54mMAA IV COMPARISON:  05/29/2021 chest radiograph and prior studies FINDINGS: No perfusion defects are identified. IMPRESSION: Pulmonary embolism absent. Electronically Signed   By: JMargarette CanadaM.D.   On: 05/29/2021 17:44   DG Chest Port 1 View  Result Date: 05/29/2021 CLINICAL DATA:  Shortness of breath EXAM: PORTABLE CHEST 1 VIEW COMPARISON:  05/26/2021 and prior studies FINDINGS: The cardiomediastinal silhouette is unremarkable. There is no evidence of focal airspace disease, pulmonary edema, suspicious pulmonary nodule/mass, pleural effusion, or pneumothorax. No acute bony abnormalities are identified. Surgical clips in the RIGHT axilla and evidence of RIGHT mastectomy noted. IMPRESSION: No active disease. Electronically Signed   By: JMargarette CanadaM.D.   On: 05/29/2021 14:56   DG Chest Port 1 View  Result Date: 05/26/2021 CLINICAL DATA:  Shortness of breath EXAM: PORTABLE CHEST 1 VIEW COMPARISON:  Yesterday FINDINGS: Normal heart size and mediastinal contours. Vague hazy density in the left  mid to lower chest. No Kerley lines, effusion, or pneumothorax. Postoperative right axilla and breast. IMPRESSION: 1. Equivocal early infiltrate in the left lung. 2. No cardiomegaly or pulmonary edema. Electronically Signed   By: JJorje GuildM.D.   On: 05/26/2021 07:58   DG Chest Port 1 View  Result Date: 05/25/2021 CLINICAL DATA:  Shortness of breath EXAM: PORTABLE CHEST 1 VIEW COMPARISON:  11/19/2020 FINDINGS: Normal heart size and mediastinal contours. Reticulation of lung markings which is chronic and greatest at the right apex where there is some degree of presumed radiation fibrosis. Postoperative right breast and axilla. No acute osseous finding. IMPRESSION: Stable chest.  No acute finding. Electronically Signed   By: JJorje GuildM.D.   On: 05/25/2021 07:47   MM 3D SCREEN BREAST UNI LEFT  Result Date: 05/13/2021 CLINICAL DATA:  Screening. EXAM: DIGITAL SCREENING UNILATERAL LEFT MAMMOGRAM WITH CAD AND TOMOSYNTHESIS TECHNIQUE: Left screening digital craniocaudal and mediolateral oblique mammograms were obtained. Left screening digital breast tomosynthesis was performed. The images were evaluated with computer-aided detection. COMPARISON:  Previous exam(s). ACR Breast Density Category c: The breast tissue is heterogeneously dense, which may obscure small masses. FINDINGS: The patient has had a right mastectomy. There are no findings suspicious for malignancy. IMPRESSION: No mammographic evidence of malignancy. A result letter of this screening mammogram will be mailed directly to the patient. RECOMMENDATION: Screening mammogram in  one year.  (Code:SM-L-29M) BI-RADS CATEGORY  1: Negative. Electronically Signed   By: Margarette Canada M.D.   On: 05/13/2021 15:26   ECHOCARDIOGRAM COMPLETE  Result Date: 05/26/2021    ECHOCARDIOGRAM REPORT   Patient Name:   [name] Date of Exam: 05/26/2021 Medical Rec #:  [id number]     Height:       65.0 in Accession #:    [id number]    Weight:       158.5 lb Date  of Birth:  Jul 30, 1941      BSA:          1.792 m Patient Age:    38 years      BP:           173/62 mmHg Patient Gender: F             HR:           79 bpm. Exam Location:  Inpatient Procedure: 2D Echo, Cardiac Doppler and Color Doppler Indications:    I50.30* Unspecified diastolic (congestive) heart failure  History:        Patient has prior history of Echocardiogram examinations, most                 recent 05/23/2019. Signs/Symptoms:Chest Pain; Risk                 Factors:Hypertension.  Sonographer:    MH Referring Phys: [facility] [place]  1. Left ventricular ejection fraction, by estimation, is 55 to 60%. The left ventricle has normal function. The left ventricle has no regional wall motion abnormalities. There is mild concentric left ventricular hypertrophy. Left ventricular diastolic parameters are consistent with Grade I diastolic dysfunction (impaired relaxation).  2. Right ventricular systolic function is normal. The right ventricular size is normal. There is severely elevated pulmonary artery systolic pressure.  3. The mitral valve is normal in structure. No evidence of mitral valve regurgitation. No evidence of mitral stenosis.  4. The aortic valve is tricuspid. Aortic valve regurgitation is mild. No aortic stenosis is present.  5. The inferior vena cava is normal in size with greater than 50% respiratory variability, suggesting right atrial pressure of 3 mmHg. FINDINGS  Left Ventricle: Left ventricular ejection fraction, by estimation, is 55 to 60%. The left ventricle has normal function. The left ventricle has no regional wall motion abnormalities. The left ventricular internal cavity size was normal in size. There is  mild concentric left ventricular hypertrophy. Left ventricular diastolic parameters are consistent with Grade I diastolic dysfunction (impaired relaxation). Indeterminate filling pressures. Right Ventricle: The right ventricular size is normal. No increase in right  ventricular wall thickness. Right ventricular systolic function is normal. There is severely elevated pulmonary artery systolic pressure. The tricuspid regurgitant velocity is 9.00 m/s, and with an assumed right atrial pressure of 3 mmHg, the estimated right ventricular systolic pressure is 264.1 mmHg. Left Atrium: Left atrial size was normal in size. Right Atrium: Right atrial size was normal in size. Pericardium: There is no evidence of pericardial effusion. Mitral Valve: The mitral valve is normal in structure. No evidence of mitral valve regurgitation. No evidence of mitral valve stenosis. Tricuspid Valve: The tricuspid valve is normal in structure. Tricuspid valve regurgitation is trivial. No evidence of tricuspid stenosis. Aortic Valve: The aortic valve is tricuspid. Aortic valve regurgitation is mild. Aortic regurgitation PHT measures 662 msec. No aortic stenosis is present. Aortic valve mean gradient measures 2.5 mmHg. Aortic valve peak gradient measures  3.4 mmHg. Aortic  valve area, by VTI measures 2.59 cm. Pulmonic Valve: The pulmonic valve was normal in structure. Pulmonic valve regurgitation is trivial. No evidence of pulmonic stenosis. Aorta: The aortic root is normal in size and structure. Venous: The inferior vena cava is normal in size with greater than 50% respiratory variability, suggesting right atrial pressure of 3 mmHg. IAS/Shunts: No atrial level shunt detected by color flow Doppler.  LEFT VENTRICLE PLAX 2D LVIDd:         4.10 cm     Diastology LVIDs:         2.90 cm     LV e' medial:    4.13 cm/s LV PW:         1.20 cm     LV E/e' medial:  13.6 LV IVS:        1.30 cm     LV e' lateral:   6.09 cm/s LVOT diam:     2.00 cm     LV E/e' lateral: 9.2 LV SV:         52 LV SV Index:   29 LVOT Area:     3.14 cm  LV Volumes (MOD) LV vol d, MOD A4C: 31.7 ml LV vol s, MOD A4C: 14.8 ml LV SV MOD A4C:     31.7 ml RIGHT VENTRICLE            IVC RV S prime:     9.79 cm/s  IVC diam: 1.80 cm TAPSE (M-mode):  2.3 cm LEFT ATRIUM             Index       RIGHT ATRIUM          Index LA diam:        2.70 cm 1.51 cm/m  RA Area:     6.10 cm LA Vol (A2C):   33.3 ml 18.58 ml/m RA Volume:   7.20 ml  4.02 ml/m LA Vol (A4C):   20.0 ml 11.16 ml/m LA Biplane Vol: 27.7 ml 15.46 ml/m  AORTIC VALVE                   PULMONIC VALVE AV Area (Vmax):    2.56 cm    PV Vmax:       0.40 m/s AV Area (Vmean):   2.49 cm    PV Peak grad:  0.6 mmHg AV Area (VTI):     2.59 cm AV Vmax:           92.65 cm/s AV Vmean:          70.400 cm/s AV VTI:            0.201 m AV Peak Grad:      3.4 mmHg AV Mean Grad:      2.5 mmHg LVOT Vmax:         75.50 cm/s LVOT Vmean:        55.800 cm/s LVOT VTI:          0.166 m LVOT/AV VTI ratio: 0.83 AI PHT:            662 msec  AORTA Ao Root diam: 2.70 cm Ao Asc diam:  2.90 cm MITRAL VALVE                TRICUSPID VALVE MV Area (PHT): 4.78 cm     TR Peak grad:   324.0 mmHg MV E velocity: 56.30 cm/s   TR Vmax:  900.00 cm/s MV A velocity: 108.00 cm/s MV E/A ratio:  0.52         SHUNTS                             Systemic VTI:  0.17 m                             Systemic Diam: 2.00 cm Skeet Latch MD Electronically signed by Skeet Latch MD Signature Date/Time: 05/26/2021/2:12:30 PM    Final      Today   Subjective    Beverely Pace today has no headache,no chest abdominal pain,no new weakness tingling or numbness, feels much better wants to go home today.   Objective   Blood pressure 136/74, pulse 73, temperature 98.5 F (36.9 C), temperature source Oral, resp. rate 20, height _0  (1.651 m), weight 71.4 kg, SpO2 97 %.   Intake/Output Summary (Last 24 hours) at 05/30/2021 1149 Last data filed at 05/29/2021 1215 Gross per 24 hour  Intake 210 ml  Output --  Net 210 ml    Exam  Awake Alert, No new F.N deficits, Normal affect [place].AT,PERRAL Supple Neck,No JVD, No cervical lymphadenopathy appriciated.  Symmetrical Chest wall movement, Good air movement bilaterally, CTAB RRR,No  Gallops,Rubs or new Murmurs, No Parasternal Heave +ve B.Sounds, Abd Soft, Non tender, No organomegaly appriciated, No rebound -guarding or rigidity. No Cyanosis, Clubbing or edema, No new Rash or bruise   Data Review   CBC w Diff:  Lab Results  Component Value Date   WBC 7.7 05/29/2021   HGB 13.1 05/29/2021   HGB 12.5 05/27/2019   HCT 37.9 05/29/2021   HCT 37.6 05/27/2019   PLT 234 05/29/2021   PLT 237 05/27/2019   LYMPHOPCT 10 05/29/2021   MONOPCT 2 05/29/2021   EOSPCT 0 05/29/2021   BASOPCT 0 05/29/2021    CMP:  Lab Results  Component Value Date   NA 135 05/29/2021   NA 140 02/03/2021   K 4.5 05/29/2021   CL 98 05/29/2021   CO2 27 05/29/2021   BUN 24 (H) 05/29/2021   BUN 8 02/03/2021   CREATININE 1.15 (H) 05/29/2021   CREATININE 0.90 11/19/2014   PROT 6.2 (L) 05/29/2021   PROT 6.3 07/12/2019   ALBUMIN 3.2 (L) 05/29/2021   ALBUMIN 4.3 07/12/2019   BILITOT 0.9 05/29/2021   BILITOT 0.6 07/12/2019   ALKPHOS 76 05/29/2021   AST 15 05/29/2021   ALT 20 05/29/2021  .   Total Time in preparing paper work, data evaluation and todays exam - 31 minutes  Lala Lund M.D on 05/30/2021 at 11:49 AM  [facility]

## 2021-05-29 NOTE — Progress Notes (Signed)
PROGRESS NOTE                                                                                                                                                                                                             Patient Demographics:    [name], is a 80 y.o. female, DOB - 06/12/1941, [id number]  Outpatient Primary MD for the patient is Alroy Dust, L.Marlou Sa, MD    LOS - 3  Admit date - 05/25/2021    Chief Complaint  Patient presents with   Shortness of Breath       Brief Narrative (HPI from H&P)   [name] is a 80 y.o. female with medical history significant of asthma, HTN, Hx of CVA, PUD, GERD, breast cancer who presented to [facility] with persistent shortness of breath, cough and wheezing.  She had been treated as outpatient for bronchitis with a Z-pack and prednisone, but failed to improve, she was admitted to the hospital with ongoing shortness of breath with running diagnosis of asthma exacerbation.  Further work-up suggested that she had newly found CHF.   Subjective:   Patient in bed, appears comfortable, denies any headache, no fever, no chest pain or pressure, improved shortness of breath , no abdominal pain. No new focal weakness.   Assessment  & Plan :     Acute Hypoxic Resp. Failure severe orthopnea ongoing for 2 to 3 weeks.  Likely new diagnosis of acute on chronic Diastolic CHF - EF [id number] -  stopped IV steroids has diuresed well with IV Lasix hold further Lasix, question of pneumonia as procalcitonin is borderline with questionable left lower lobe infiltrate, stable noncontrast CT, continue empiric Levaquin x 3 days   2.  Hypertension.  Blood pressure medications adjusted on 05/26/2021 for better control we will continue to monitor.  3.  Dyslipidemia.  On statin continue.  4. ? L.  Infiltrate.  Has finished out patient Z-Pak and steroid course, will give short dose of oral Levaquin and monitor.  No  productive cough.  Will trend leukocyte count and procalcitonin.  5.  AKI.  Gentle hydration, resolved.  6. ++ PA pressure on TTE, different from 2018 - VQ now.      Condition - Fair  Family Communication  : husband bedside  Code Status : Full  Consults  : None  PUD  Prophylaxis : None   Procedures  :     CT - Bilateral lung opacities noted on prior exam have significantly improved. Minimal bibasilar scarring is noted as well as right apical scarring. No definite acute pulmonary parenchymal abnormality is noted. Mild coronary artery calcifications are noted. Aortic Atherosclerosis   Echocardiogram - 1. Left ventricular ejection fraction, by estimation, is 55 to 60%. The left ventricle has normal function. The left ventricle has no regional wall motion abnormalities. There is mild concentric left ventricular hypertrophy. Left ventricular diastolic parameters are consistent with Grade I diastolic dysfunction (impaired relaxation).  2. Right ventricular systolic function is normal. The right ventricular size is normal. There is severely elevated pulmonary artery systolic pressure.  3. The mitral valve is normal in structure. No evidence of mitral valve regurgitation. No evidence of mitral stenosis.  4. The aortic valve is tricuspid. Aortic valve regurgitation is mild. No aortic stenosis is present.  5. The inferior vena cava is normal in size with greater than 50% respiratory variability, suggesting right atrial pressure of 3 mmHg.      Disposition Plan  :    Status is: INP  Dispo: The patient is from: Home              Anticipated d/c is to: Home              Patient currently is not medically stable to d/c.   Difficult to place patient No   DVT Prophylaxis  :    enoxaparin (LOVENOX) injection 40 mg Start: 05/25/21 1800  Lab Results  Component Value Date   PLT 234 05/29/2021    Diet :  Diet Order             Diet Heart Room service appropriate? Yes; Fluid consistency:  Thin  Diet effective now                    Inpatient Medications  Scheduled Meds:  amLODipine  5 mg Oral Daily   atorvastatin  80 mg Oral q1800   carvedilol  6.25 mg Oral BID WC   clopidogrel  75 mg Oral Daily   DULoxetine  30 mg Oral Daily   enoxaparin (LOVENOX) injection  40 mg Subcutaneous Q24H   guaiFENesin  600 mg Oral BID   insulin aspart  0-9 Units Subcutaneous TID WC   ipratropium  0.5 mg Nebulization BID   isosorbide mononitrate  30 mg Oral Daily   loratadine  10 mg Oral Daily   mometasone-formoterol  2 puff Inhalation BID   Continuous Infusions:   PRN Meds:.acetaminophen **OR** acetaminophen, albuterol, bisacodyl, chlorpheniramine-HYDROcodone, clonazePAM, menthol-cetylpyridinium, nitroGLYCERIN, senna-docusate, traZODone  Antibiotics  :    Anti-infectives (From admission, onward)    Start     Dose/Rate Route Frequency Ordered Stop   05/29/21 0000  levofloxacin (LEVAQUIN) 500 MG tablet        500 mg Oral Daily 05/29/21 1115 06/08/21 2359   05/26/21 1300  levofloxacin (LEVAQUIN) tablet 500 mg        500 mg Oral Daily 05/26/21 1202 05/28/21 0944        Time Spent in minutes  30   Lala Lund M.D on 05/29/2021 at 1:51 PM  To page go to [email or web address]   [facility] -  Office  [phone]    See all Orders from today for further details    Objective:   Vitals:   05/29/21 0745 05/29/21 0747 05/29/21 0815 05/29/21 1300  BP:   Marland Kitchen)  141/79 113/81  Pulse:   62 76  Resp:   20 20  Temp:   97.9 F (36.6 C) 99.6 F (37.6 C)  TempSrc:   Oral Oral  SpO2: 95% 95% 91% 94%  Weight:      Height:        Wt Readings from Last 3 Encounters:  05/29/21 71.4 kg  02/03/21 69.9 kg  11/19/20 70.3 kg    No intake or output data in the 24 hours ending 05/29/21 1351    Physical Exam  Awake Alert, No new F.N deficits, Normal affect [place].AT,PERRAL Supple Neck,No JVD, No cervical lymphadenopathy appriciated.  Symmetrical Chest wall movement, Good air  movement bilaterally, CTAB RRR,No Gallops, Rubs or new Murmurs, No Parasternal Heave +ve B.Sounds, Abd Soft, No tenderness, No organomegaly appriciated, No rebound - guarding or rigidity. No Cyanosis, Clubbing or edema, No new Rash or bruise      Data Review:    CBC Recent Labs  Lab 05/25/21 0759 05/26/21 0029 05/27/21 0042 05/28/21 0132 05/29/21 0034  WBC 10.7* 9.1 9.9 8.0 7.7  HGB 12.9 12.9 12.3 11.6* 13.1  HCT 36.3 37.4 35.9* 34.5* 37.9  PLT 254 224 245 215 234  MCV 85.2 86.0 87.1 87.6 86.9  MCH 30.3 29.7 29.9 29.4 30.0  MCHC 35.5 34.5 34.3 33.6 34.6  RDW 13.5 13.9 13.3 13.5 13.2  LYMPHSABS 3.9  --  0.8 2.0 0.7  MONOABS 0.9  --  0.6 0.5 0.2  EOSABS 0.1  --  0.3 0.2 0.0  BASOSABS 0.1  --  0.0 0.0 0.0    Recent Labs  Lab 05/25/21 0759 05/26/21 0029 05/27/21 0042 05/28/21 0132 05/29/21 0034  NA 135 135 131* 131* 135  K 2.9* 4.1 3.9 3.8 4.5  CL 98 98 97* 96* 98  CO2 '25 28 24 25 27  '$ GLUCOSE 326* 205* 252* 269* 289*  BUN 18 12 33* 29* 24*  CREATININE 0.87 0.73 1.32* 1.05* 1.15*  CALCIUM 9.3 8.9 9.0 8.6* 9.2  AST  --   --  16 14* 15  ALT  --   --  '22 17 20  '$ ALKPHOS  --   --  71 61 76  BILITOT  --   --  0.8 0.9 0.9  ALBUMIN  --   --  3.2* 2.8* 3.2*  MG 1.6*  --  1.9 1.8 1.9  PROCALCITON  --  <0.10 0.16 <0.10  --   HGBA1C  --  7.0*  --   --   --   BNP  --  83.1 66.3 25.5 44.9    ------------------------------------------------------------------------------------------------------------------ No results for input(s): CHOL, HDL, LDLCALC, TRIG, CHOLHDL, LDLDIRECT in the last 72 hours.  Lab Results  Component Value Date   HGBA1C 7.0 (H) 05/26/2021   ------------------------------------------------------------------------------------------------------------------ No results for input(s): TSH, T4TOTAL, T3FREE, THYROIDAB in the last 72 hours.  Invalid input(s): FREET3  Cardiac Enzymes No results for input(s): CKMB, TROPONINI, MYOGLOBIN in the last 168  hours.  Invalid input(s): CK ------------------------------------------------------------------------------------------------------------------    Component Value Date/Time   BNP 44.9 05/29/2021 0034    Radiology Reports CT CHEST WO CONTRAST  Result Date: 05/27/2021 CLINICAL DATA:  Pneumonia. EXAM: CT CHEST WITHOUT CONTRAST TECHNIQUE: Multidetector CT imaging of the chest was performed following the standard protocol without IV contrast. COMPARISON:  May 26, 2021.  November 19, 2020. FINDINGS: Cardiovascular: Atherosclerosis of thoracic aorta is noted without aneurysm formation. Normal cardiac size. No pericardial effusion. Mild coronary artery calcifications are noted. Mediastinum/Nodes: No enlarged  mediastinal or axillary lymph nodes. Thyroid gland, trachea, and esophagus demonstrate no significant findings. Lungs/Pleura: No pneumothorax or pleural effusion is noted. Stable right apical scarring is noted. Minimal bibasilar scarring is noted. No acute abnormality is noted. Upper Abdomen: No acute abnormality. Musculoskeletal: No chest wall mass or suspicious bone lesions identified. IMPRESSION: Bilateral lung opacities noted on prior exam have significantly improved. Minimal bibasilar scarring is noted as well as right apical scarring. No definite acute pulmonary parenchymal abnormality is noted. Mild coronary artery calcifications are noted. Aortic Atherosclerosis (ICD10-I70.0). Electronically Signed   By: Marijo Conception M.D.   On: 05/27/2021 13:37   DG Chest Port 1 View  Result Date: 05/26/2021 CLINICAL DATA:  Shortness of breath EXAM: PORTABLE CHEST 1 VIEW COMPARISON:  Yesterday FINDINGS: Normal heart size and mediastinal contours. Vague hazy density in the left mid to lower chest. No Kerley lines, effusion, or pneumothorax. Postoperative right axilla and breast. IMPRESSION: 1. Equivocal early infiltrate in the left lung. 2. No cardiomegaly or pulmonary edema. Electronically Signed   By:  Jorje Guild M.D.   On: 05/26/2021 07:58   DG Chest Port 1 View  Result Date: 05/25/2021 CLINICAL DATA:  Shortness of breath EXAM: PORTABLE CHEST 1 VIEW COMPARISON:  11/19/2020 FINDINGS: Normal heart size and mediastinal contours. Reticulation of lung markings which is chronic and greatest at the right apex where there is some degree of presumed radiation fibrosis. Postoperative right breast and axilla. No acute osseous finding. IMPRESSION: Stable chest.  No acute finding. Electronically Signed   By: Jorje Guild M.D.   On: 05/25/2021 07:47   MM 3D SCREEN BREAST UNI LEFT  Result Date: 05/13/2021 CLINICAL DATA:  Screening. EXAM: DIGITAL SCREENING UNILATERAL LEFT MAMMOGRAM WITH CAD AND TOMOSYNTHESIS TECHNIQUE: Left screening digital craniocaudal and mediolateral oblique mammograms were obtained. Left screening digital breast tomosynthesis was performed. The images were evaluated with computer-aided detection. COMPARISON:  Previous exam(s). ACR Breast Density Category c: The breast tissue is heterogeneously dense, which may obscure small masses. FINDINGS: The patient has had a right mastectomy. There are no findings suspicious for malignancy. IMPRESSION: No mammographic evidence of malignancy. A result letter of this screening mammogram will be mailed directly to the patient. RECOMMENDATION: Screening mammogram in one year.  (Code:SM-L-82M) BI-RADS CATEGORY  1: Negative. Electronically Signed   By: Margarette Canada M.D.   On: 05/13/2021 15:26   ECHOCARDIOGRAM COMPLETE  Result Date: 05/26/2021    ECHOCARDIOGRAM REPORT   Patient Name:   DEDE LAMINACK Date of Exam: 05/26/2021 Medical Rec #:  [id number]     Height:       65.0 in Accession #:    [id number]    Weight:       158.5 lb Date of Birth:  02/25/1941      BSA:          1.792 m Patient Age:    69 years      BP:           173/62 mmHg Patient Gender: F             HR:           79 bpm. Exam Location:  Inpatient Procedure: 2D Echo, Cardiac Doppler and Color  Doppler Indications:    I50.30* Unspecified diastolic (congestive) heart failure  History:        Patient has prior history of Echocardiogram examinations, most  recent 05/23/2019. Signs/Symptoms:Chest Pain; Risk                 Factors:Hypertension.  Sonographer:    MH Referring Phys: Lyons F[place]  1. Left ventricular ejection fraction, by estimation, is 55 to 60%. The left ventricle has normal function. The left ventricle has no regional wall motion abnormalities. There is mild concentric left ventricular hypertrophy. Left ventricular diastolic parameters are consistent with Grade I diastolic dysfunction (impaired relaxation).  2. Right ventricular systolic function is normal. The right ventricular size is normal. There is severely elevated pulmonary artery systolic pressure.  3. The mitral valve is normal in structure. No evidence of mitral valve regurgitation. No evidence of mitral stenosis.  4. The aortic valve is tricuspid. Aortic valve regurgitation is mild. No aortic stenosis is present.  5. The inferior vena cava is normal in size with greater than 50% respiratory variability, suggesting right atrial pressure of 3 mmHg. FINDINGS  Left Ventricle: Left ventricular ejection fraction, by estimation, is 55 to 60%. The left ventricle has normal function. The left ventricle has no regional wall motion abnormalities. The left ventricular internal cavity size was normal in size. There is  mild concentric left ventricular hypertrophy. Left ventricular diastolic parameters are consistent with Grade I diastolic dysfunction (impaired relaxation). Indeterminate filling pressures. Right Ventricle: The right ventricular size is normal. No increase in right ventricular wall thickness. Right ventricular systolic function is normal. There is severely elevated pulmonary artery systolic pressure. The tricuspid regurgitant velocity is 9.00 m/s, and with an assumed right atrial pressure of 3  mmHg, the estimated right ventricular systolic pressure is [id number] mmHg. Left Atrium: Left atrial size was normal in size. Right Atrium: Right atrial size was normal in size. Pericardium: There is no evidence of pericardial effusion. Mitral Valve: The mitral valve is normal in structure. No evidence of mitral valve regurgitation. No evidence of mitral valve stenosis. Tricuspid Valve: The tricuspid valve is normal in structure. Tricuspid valve regurgitation is trivial. No evidence of tricuspid stenosis. Aortic Valve: The aortic valve is tricuspid. Aortic valve regurgitation is mild. Aortic regurgitation PHT measures 662 msec. No aortic stenosis is present. Aortic valve mean gradient measures 2.5 mmHg. Aortic valve peak gradient measures 3.4 mmHg. Aortic  valve area, by VTI measures 2.59 cm. Pulmonic Valve: The pulmonic valve was normal in structure. Pulmonic valve regurgitation is trivial. No evidence of pulmonic stenosis. Aorta: The aortic root is normal in size and structure. Venous: The inferior vena cava is normal in size with greater than 50% respiratory variability, suggesting right atrial pressure of 3 mmHg. IAS/Shunts: No atrial level shunt detected by color flow Doppler.  LEFT VENTRICLE PLAX 2D LVIDd:         4.10 cm     Diastology LVIDs:         2.90 cm     LV e' medial:    4.13 cm/s LV PW:         1.20 cm     LV E/e' medial:  13.6 LV IVS:        1.30 cm     LV e' lateral:   6.09 cm/s LVOT diam:     2.00 cm     LV E/e' lateral: 9.2 LV SV:         52 LV SV Index:   29 LVOT Area:     3.14 cm  LV Volumes (MOD) LV vol d, MOD A4C: 31.7 ml LV vol s, MOD A4C:  14.8 ml LV SV MOD A4C:     31.7 ml RIGHT VENTRICLE            IVC RV S prime:     9.79 cm/s  IVC diam: 1.80 cm TAPSE (M-mode): 2.3 cm LEFT ATRIUM             Index       RIGHT ATRIUM          Index LA diam:        2.70 cm 1.51 cm/m  RA Area:     6.10 cm LA Vol (A2C):   33.3 ml 18.58 ml/m RA Volume:   7.20 ml  4.02 ml/m LA Vol (A4C):   20.0 ml 11.16 ml/m  LA Biplane Vol: 27.7 ml 15.46 ml/m  AORTIC VALVE                   PULMONIC VALVE AV Area (Vmax):    2.56 cm    PV Vmax:       0.40 m/s AV Area (Vmean):   2.49 cm    PV Peak grad:  0.6 mmHg AV Area (VTI):     2.59 cm AV Vmax:           92.65 cm/s AV Vmean:          70.400 cm/s AV VTI:            0.201 m AV Peak Grad:      3.4 mmHg AV Mean Grad:      2.5 mmHg LVOT Vmax:         75.50 cm/s LVOT Vmean:        55.800 cm/s LVOT VTI:          0.166 m LVOT/AV VTI ratio: 0.83 AI PHT:            662 msec  AORTA Ao Root diam: 2.70 cm Ao Asc diam:  2.90 cm MITRAL VALVE                TRICUSPID VALVE MV Area (PHT): 4.78 cm     TR Peak grad:   324.0 mmHg MV E velocity: 56.30 cm/s   TR Vmax:        900.00 cm/s MV A velocity: 108.00 cm/s MV E/A ratio:  0.52         SHUNTS                             Systemic VTI:  0.17 m                             Systemic Diam: 2.00 cm Skeet Latch MD Electronically signed by Skeet Latch MD Signature Date/Time: 05/26/2021/2:12:30 PM    Final

## 2021-05-29 NOTE — Discharge Instructions (Addendum)
Follow with Primary MD Alroy Dust, L.Marlou Sa, MD and your Cardiologist in 7 days   Get CBC, CMP, Magnesium, 2 view Chest X ray -  checked next visit within 1 week by Primary MD    Activity: As tolerated with Full fall precautions use walker/cane & assistance as needed  Disposition Home   Diet: Heart Healthy with strict 1.5 lit/day total fluid restriction, Low Carb  Check your Weight same time everyday, if you gain over 2 pounds, or you develop in leg swelling, experience more shortness of breath or chest pain, call your Primary MD immediately. Follow Cardiac Low Salt Diet and 1.5 lit/day fluid restriction.  Accuchecks 4 times/day, Once in AM empty stomach and then before each meal. Log in all results and show them to your Prim.MD in 3 days. If any glucose reading is under 80 or above 300 call your Prim MD immidiately. Follow Low glucose instructions for glucose under 80 as instructed.  Special Instructions: If you have smoked or chewed Tobacco  in the last 2 yrs please stop smoking, stop any regular Alcohol  and or any Recreational drug use.  On your next visit with your primary care physician please Get Medicines reviewed and adjusted.  Please request your Prim.MD to go over all Hospital Tests and Procedure/Radiological results at the follow up, please get all Hospital records sent to your Prim MD by signing hospital release before you go home.  If you experience worsening of your admission symptoms, develop shortness of breath, life threatening emergency, suicidal or homicidal thoughts you must seek medical attention immediately by calling 911 or calling your MD immediately  if symptoms less severe.  You Must read complete instructions/literature along with all the possible adverse reactions/side effects for all the Medicines you take and that have been prescribed to you. Take any new Medicines after you have completely understood and accpet all the possible adverse reactions/side effects.

## 2021-05-30 LAB — GLUCOSE, CAPILLARY
Glucose-Capillary: 216 mg/dL — ABNORMAL HIGH (ref 70–99)
Glucose-Capillary: 305 mg/dL — ABNORMAL HIGH (ref 70–99)

## 2021-05-30 MED ORDER — METFORMIN HCL 500 MG PO TABS
500.0000 mg | ORAL_TABLET | Freq: Two times a day (BID) | ORAL | Status: DC
  Administered 2021-05-30: 500 mg via ORAL
  Filled 2021-05-30: qty 1

## 2021-05-30 MED ORDER — BLOOD GLUCOSE MONITOR KIT: PACK | 1 refills | Status: DC

## 2021-05-30 MED ORDER — GLIPIZIDE 5 MG PO TABS: 2.5000 mg | ORAL_TABLET | Freq: Every day | ORAL | 0 refills | Status: DC

## 2021-05-30 MED ORDER — LIVING WELL WITH DIABETES BOOK
Freq: Once | Status: AC
  Administered 2021-05-30: 1
  Filled 2021-05-30: qty 1

## 2021-05-30 MED ORDER — GLIPIZIDE 5 MG PO TABS
2.5000 mg | ORAL_TABLET | Freq: Every day | ORAL | Status: DC
  Administered 2021-05-30: 2.5 mg via ORAL
  Filled 2021-05-30: qty 1

## 2021-05-30 MED ORDER — METFORMIN HCL 500 MG PO TABS: 500.0000 mg | ORAL_TABLET | Freq: Two times a day (BID) | ORAL | 0 refills | Status: DC

## 2021-05-30 NOTE — Progress Notes (Addendum)
Spoke with Adah Perl, Diabetes Coordinator via phone (they are not physically here on weekends). She stated since pt is not going home on insulin or oral diabetic medication that she recommends just sending patient home with "living well with diabetes" booklet and then having follow up with PCP to recheck A1c and go from there per there recommendations. Spoke to Dr. Candiss Norse about this. Will educate patient and husband about diabetes and answer any questions they have prior to discharge and make sure they follow up with her PCP.  Update @ 1210: Pt going home on 2 oral diabetic medications and a glucometer. Showed patient and husband how to take and record glucose readings as well as times to check sugars. Oral medications explained as well. Patient and husband verbalized understanding.

## 2021-05-30 NOTE — TOC Transition Note (Signed)
Transition of Care [facility]) - CM/SW Discharge Note   Patient Details  Name: [name] MRN: [id number] Date of Birth: 1941/05/16  Transition of Care [facility]) CM/SW Contact:  Carles Collet, RN Phone Number: 05/30/2021, 8:24 AM   Clinical Narrative:     [name] w patient. Reviewed therapy recommendations. She states that she has shower seat at home already, no other DC needs identified.                                                     Readmission Risk Interventions No flowsheet data found.

## 2021-05-31 ENCOUNTER — Other Ambulatory Visit: Payer: Self-pay | Admitting: Internal Medicine

## 2021-05-31 MED ORDER — BLOOD GLUCOSE MONITOR KIT: PACK | 0 refills | Status: DC

## 2021-06-02 ENCOUNTER — Telehealth: Payer: Self-pay | Admitting: Cardiovascular Disease

## 2021-06-02 NOTE — Telephone Encounter (Signed)
Patient is requesting to schedule a hospital follow up with Dr. [name] only. I offered 09/23 with Richardson Dopp, but she declined, stating she does not want to see a PA and only wishes to see Dr. [name]. She states Dr. [name] is a good friend of hers and will be able to get her worked in for a hospital follow up.

## 2021-06-02 NOTE — Telephone Encounter (Signed)
Called patient and scheduled her to see Dr. [name] on 9/22. She was grateful for the call.

## 2021-06-03 ENCOUNTER — Other Ambulatory Visit: Payer: Self-pay

## 2021-06-03 ENCOUNTER — Other Ambulatory Visit: Payer: Self-pay | Admitting: Family Medicine

## 2021-06-03 ENCOUNTER — Ambulatory Visit (INDEPENDENT_AMBULATORY_CARE_PROVIDER_SITE_OTHER): Payer: Medicare Other | Admitting: Cardiovascular Disease

## 2021-06-03 ENCOUNTER — Ambulatory Visit
Admission: RE | Admit: 2021-06-03 | Discharge: 2021-06-03 | Disposition: A | Discharge status: Home / Self Care | Payer: Medicare Other | Source: Ambulatory Visit | Attending: Family Medicine | Admitting: Family Medicine

## 2021-06-03 ENCOUNTER — Encounter: Payer: Self-pay | Admitting: Cardiovascular Disease

## 2021-06-03 VITALS — BP 142/80 | HR 80 | Ht 65.0 in | Wt 156.8 lb

## 2021-06-03 DIAGNOSIS — J189 Pneumonia, unspecified organism: Secondary | ICD-10-CM

## 2021-06-03 DIAGNOSIS — I25118 Atherosclerotic heart disease of native coronary artery with other forms of angina pectoris: Secondary | ICD-10-CM

## 2021-06-03 DIAGNOSIS — R942 Abnormal results of pulmonary function studies: Secondary | ICD-10-CM | POA: Diagnosis not present

## 2021-06-03 DIAGNOSIS — J45909 Unspecified asthma, uncomplicated: Secondary | ICD-10-CM | POA: Diagnosis not present

## 2021-06-03 DIAGNOSIS — R0600 Dyspnea, unspecified: Secondary | ICD-10-CM

## 2021-06-03 DIAGNOSIS — Z9189 Other specified personal risk factors, not elsewhere classified: Secondary | ICD-10-CM | POA: Diagnosis not present

## 2021-06-03 DIAGNOSIS — R7309 Other abnormal glucose: Secondary | ICD-10-CM | POA: Diagnosis not present

## 2021-06-03 DIAGNOSIS — R0609 Other forms of dyspnea: Secondary | ICD-10-CM

## 2021-06-03 DIAGNOSIS — I5030 Unspecified diastolic (congestive) heart failure: Secondary | ICD-10-CM | POA: Diagnosis not present

## 2021-06-03 DIAGNOSIS — R058 Other specified cough: Secondary | ICD-10-CM

## 2021-06-03 DIAGNOSIS — I700 Atherosclerosis of aorta: Secondary | ICD-10-CM | POA: Diagnosis not present

## 2021-06-03 NOTE — Patient Instructions (Signed)
Medication Instructions:  Your physician recommends that you continue on your current medications as directed. Please refer to the Current Medication list given to you today.  *If you need a refill on your cardiac medications before your next appointment, please call your pharmacy*   Lab Work: None Ordered If you have labs (blood work) drawn today and your tests are completely normal, you will receive your results only by: [place] (if you have MyChart) OR A paper copy in the mail If you have any lab test that is abnormal or we need to change your treatment, we will call you to review the results.   Testing/Procedures: None Ordered   Follow-Up: At [facility], you and your health needs are our priority.  As part of our continuing mission to provide you with exceptional heart care, we have created designated Provider Care Teams.  These Care Teams include your primary Cardiologist (physician) and Advanced Practice Providers (APPs -  Physician Assistants and Nurse Practitioners) who all work together to provide you with the care you need, when you need it.  We recommend signing up for the patient portal called "MyChart".  Sign up information is provided on this After Visit Summary.  MyChart is used to connect with patients for Virtual Visits (Telemedicine).  Patients are able to view lab/test results, encounter notes, upcoming appointments, etc.  Non-urgent messages can be sent to your provider as well.   To learn more about what you can do with MyChart, go to [email or web address].    Your next appointment:   1 year(s)  The format for your next appointment:   In Person  Provider:   You may see [name], MD or one of the following Advanced Practice Providers on your designated Care Team:   Richardson Dopp, PA-C Robbie Lis, [place]   Other Instructions You have been referred to [facility] - you should receive a call from their office to get scheduled

## 2021-06-03 NOTE — Progress Notes (Signed)
Cardiology Office Note:    Date:  06/03/2021   ID:  [name], DOB 12-04-1940, MRN [id number]  PCP:  [name], MD  Cardiologist:  [name], MD    Referring MD: [name], MD   Chief Complaint  Patient presents with   aortic insufficiency   Problem list 1. Shortness of breath with exertion - Asthma  2. Hyperlipidemia 3. Hypertension 4. CVA - on Plavix       [name] is a 80 y.o. female with a hx of DOE Hx of TIA years ago, was started on Plavix at that time Has no energy.   Gets short of breath walking to the mailbox and back  ( 100 yards)  No CP or tightness.    No syncope.    Has had a chronic cough / throat clearing . Had blood work at primary MD Alroy Dust)   Has significant vertebral Luevenia Maxin arterial disease. Dr. Erlinda Hong has given her a minimum  BP goal of 120-150  Dr. Mickle Plumb records from [place] reviewed  Sept. 5, 2019:  Seen for DOE and generalized fatigue Went to [facility] pulmonary and was told she had asthma Takes her inhalers.  Went to her primary care for her fatigue - was told to decrease her carbs   Denies any chest pain  Has headaches and has some dizziness ( history of a TIA in the past )  Does not get any regular exercise   Aug. 25, 2020   Has had worsening DOE recently  Similar to last year  Having severe DOE  Has seen pulmonary md in [place] of chest tightness.  I hear no wheezing on exam today   Oct. 30, 2020   [name] is seen today for follow-up of her severe shortness of breath with exertion.  We performed a right and left heart catheterization on her on March 29, 2019. The mid LAD had a 30% stenosis.  Fed normal left ventricular end-diastolic pressure with an EDP of 6 mmHg.  The left ventricular systolic function was normal with an ejection fraction of 55 to 65%. Right heart pressures were unremarkable.  Mean PA pressure was 40 mmHg.  Pulmonary capillary wedge pressure was 5.  Cardiac  output was 7.3 L/min.  Feeling some better.   Still does not have the breathing capacity .  Has been walking some.   Seems to feel better on the Imdur   February 12, 2020: [name] is seen today for follow-up of her shortness of breath.  We performed a heart catheterization in July, 2020.   Their son is still in the hospital following surgery on his spine.   Lots of complications / infection Stressed but is otherwise doing well from a cardiac standpoint   Sept. 22, 2022 Was just hospitalized for CHF and pneumonia Echocardiogram from May 26, 2021 reveals normal left ventricular systolic function.  She has grade 1 diastolic dysfunction. Mild AI. Was treaded with lasix and leviquin    Feeling a bit better.   Past Medical History:  Diagnosis Date   Asthma    Breast CA ([place])    Cancer ([place])    right breast mastectomy   DDD (degenerative disc disease), cervical    H/O chest pain 03/24/2006   normal nuclear  study   Headache    High cholesterol    Hypertension    Mild aortic insufficiency 03/28/2006   echo   Normal cardiac stress test 2005  Personal history of chemotherapy    Personal history of radiation therapy    PONV (postoperative nausea and vomiting)    Sinus bradycardia on ECG    Stroke [facility])     Past Surgical History:  Procedure Laterality Date   APPENDECTOMY     "30 years ago"   BREAST SURGERY     childbirth     x 2   CHOLECYSTECTOMY     "15 years ago"   ESOPHAGOGASTRODUODENOSCOPY N/A 03/04/2014   Procedure: ESOPHAGOGASTRODUODENOSCOPY (EGD);  Surgeon: [name], MD;  Location: [facility] ENDOSCOPY;  Service: Endoscopy;  Laterality: N/A;   MASTECTOMY  1993   right breast   OTHER SURGICAL HISTORY     hysterectomy   rectal fissure     RIGHT/LEFT HEART CATH AND CORONARY ANGIOGRAPHY N/A 05/30/2019   Procedure: RIGHT/LEFT HEART CATH AND CORONARY ANGIOGRAPHY;  Surgeon: [name], MD;  Location: [place] CV LAB;  Service: Cardiovascular;  Laterality: N/A;    TUBAL LIGATION      Current Medications: Current Meds  Medication Sig   albuterol (VENTOLIN HFA) 108 (90 Base) MCG/ACT inhaler Inhale 1-2 puffs into the lungs every 4 (four) hours as needed for wheezing or shortness of breath.   amLODipine (NORVASC) 5 MG tablet Take 5 mg by mouth daily.   atorvastatin (LIPITOR) 80 MG tablet Take 1 tablet (80 mg total) by mouth daily at 6 PM.   blood glucose meter kit and supplies KIT Dispense based on patient and insurance preference. Use up to four times daily as directed. . For QAC - HS accuchecks.   carvedilol (COREG) 6.25 MG tablet Take 1 tablet (6.25 mg total) by mouth 2 (two) times daily with a meal.   clobetasol cream (TEMOVATE) 6.73 % Apply 1 application topically daily at 6 (six) AM.   clopidogrel (PLAVIX) 75 MG tablet Take 1 tablet (75 mg total) by mouth daily.   DULoxetine (CYMBALTA) 30 MG capsule Take 30 mg by mouth daily.   Fluticasone-Salmeterol (ADVAIR) 250-50 MCG/DOSE AEPB Inhale 1 puff into the lungs 2 (two) times daily.   furosemide (LASIX) 40 MG tablet Take 1 tablet (40 mg total) by mouth daily as needed for fluid or edema (If your weight is above 2.5 pounds from your baseline weight, weigh yourself daily at the same time).   glipiZIDE (GLUCOTROL) 5 MG tablet Take 0.5 tablets (2.5 mg total) by mouth daily before breakfast.   guaiFENesin (MUCINEX) 600 MG 12 hr tablet Take 1 tablet (600 mg total) by mouth 2 (two) times daily as needed for up to 7 days for cough.   isosorbide mononitrate (IMDUR) 30 MG 24 hr tablet TAKE 1 TABLET BY MOUTH  DAILY   loratadine (CLARITIN) 10 MG tablet Take 10 mg by mouth daily.   montelukast (SINGULAIR) 10 MG tablet Take 1 tablet (10 mg total) by mouth at bedtime.   nitroGLYCERIN (NITROSTAT) 0.4 MG SL tablet Place 1 tablet (0.4 mg total) under the tongue every 5 (five) minutes as needed for chest pain.   Current Facility-Administered Medications for the 06/03/21 encounter (Office Visit) with Shyna Duignan, Wonda Cheng, MD   Medication   triamcinolone acetonide (KENALOG) 10 MG/ML injection 10 mg     Allergies:   Codeine, Doxycycline, Percocet [oxycodone-acetaminophen], and Tramadol   Social History   Socioeconomic History   Marital status: Married    Spouse name: Tommy    Number of children: 4   Years of education: 12+   Highest education level: Not on file  Occupational History   Not on file  Tobacco Use   Smoking status: Never   Smokeless tobacco: Never  Vaping Use   Vaping Use: Never used  Substance and Sexual Activity   Alcohol use: No    Alcohol/week: 0.0 standard drinks   Drug use: No   Sexual activity: Not Currently  Other Topics Concern   Not on file  Social History Narrative   Right handed, caffeine 2 cups daily, married, 4 kids, Retired.  [employer].    Social Determinants of Health   Financial Resource Strain: Not on file  Food Insecurity: Not on file  Transportation Needs: Not on file  Physical Activity: Not on file  Stress: Not on file  Social Connections: Not on file     Family History: The patient's family history includes Asthma in her mother; Cancer (age of onset: 91) in her sister; Heart attack in her father, mother, sister, and sister; Heart disease in her father and mother; Hypertension in her mother, sister, and sister. ROS:   Please see the history of present illness.     All other systems reviewed and are negative.  EKGs/Labs/Other Studies Reviewed:    The following studies were reviewed today:   EKG:        Recent Labs: 05/29/2021: ALT 20; B Natriuretic Peptide 44.9; BUN 24; Creatinine, Ser 1.15; Hemoglobin 13.1; Magnesium 1.9; Platelets 234; Potassium 4.5; Sodium 135  Recent Lipid Panel    Component Value Date/Time   CHOL 109 07/12/2019 0925   TRIG 148 07/12/2019 0925   HDL 46 07/12/2019 0925   CHOLHDL 2.4 07/12/2019 0925   CHOLHDL 6.1 04/28/2015 0645   VLDL 60 (H) 04/28/2015 0645   LDLCALC 38 07/12/2019 0925    Physical Exam: Blood pressure  (!) 142/80, pulse 80, height _0  (1.651 m), weight 156 lb 12.8 oz (71.1 kg), SpO2 98 %.  GEN:  Well nourished, well developed in no acute distress HEENT: Normal NECK: No JVD; No carotid bruits LYMPHATICS: No lymphadenopathy CARDIAC: RRR  RESPIRATORY:  tight wheezing bilaterally  ABDOMEN: Soft, non-tender, non-distended MUSCULOSKELETAL:  No edema; No deformity  SKIN: Warm and dry NEUROLOGIC:  Alert and oriented x 3    ASSESSMENT:    1. DOE (dyspnea on exertion)   2. Cough present for greater than 3 weeks   3. Abnormal PFT     PLAN:    1. Shortness of breath with exertion:  She has severe wheezing bilaterall.  She does not recall ever seeing pulmonary . I would like for her to discuss further options for treatment of her asthma    2. Hypertension:   BP is well controlled.    3. Hyperlipidemia: last lipids look good    In order of problems listed above: Medication Adjustments/Labs and Tests Ordered: Current medicines are reviewed at length with the patient today.  Concerns regarding medicines are outlined above.  Orders Placed This Encounter  Procedures   Ambulatory referral to Pulmonology    No orders of the defined types were placed in this encounter.    Signed, [name], MD  06/03/2021 3:56 PM    [place]

## 2021-06-06 ENCOUNTER — Other Ambulatory Visit: Payer: Self-pay | Admitting: Cardiovascular Disease

## 2021-06-08 LAB — ECHOCARDIOGRAM COMPLETE
AR max vel: 2.56 cm2
AV Area VTI: 2.59 cm2
AV Area mean vel: 2.49 cm2
AV Mean grad: 2.5 mmHg
AV Peak grad: 3.4 mmHg
Ao pk vel: 0.93 m/s
Area-P 1/2: 4.78 cm2
Height: 65 in
P 1/2 time: 662 msec
S' Lateral: 2.9 cm
Single Plane A4C EF: 53.3 %
Weight: 2536 oz

## 2021-06-17 DIAGNOSIS — J340 Abscess, furuncle and carbuncle of nose: Secondary | ICD-10-CM | POA: Diagnosis not present

## 2021-06-17 DIAGNOSIS — R52 Pain, unspecified: Secondary | ICD-10-CM | POA: Diagnosis not present

## 2021-06-22 ENCOUNTER — Ambulatory Visit: Payer: Medicare Other | Admitting: Podiatry

## 2021-06-23 ENCOUNTER — Ambulatory Visit: Payer: Medicare Other | Admitting: Podiatry

## 2021-07-04 ENCOUNTER — Other Ambulatory Visit: Payer: Self-pay

## 2021-07-04 ENCOUNTER — Emergency Department (HOSPITAL_BASED_OUTPATIENT_CLINIC_OR_DEPARTMENT_OTHER)
Admission: EM | Admit: 2021-07-04 | Discharge: 2021-07-05 | Disposition: A | Discharge status: Home / Self Care | Payer: Medicare Other | Attending: Emergency Medicine | Admitting: Emergency Medicine

## 2021-07-04 ENCOUNTER — Encounter (HOSPITAL_BASED_OUTPATIENT_CLINIC_OR_DEPARTMENT_OTHER): Payer: Self-pay

## 2021-07-04 DIAGNOSIS — J45909 Unspecified asthma, uncomplicated: Secondary | ICD-10-CM | POA: Diagnosis not present

## 2021-07-04 DIAGNOSIS — Z7902 Long term (current) use of antithrombotics/antiplatelets: Secondary | ICD-10-CM | POA: Insufficient documentation

## 2021-07-04 DIAGNOSIS — R0602 Shortness of breath: Secondary | ICD-10-CM | POA: Diagnosis not present

## 2021-07-04 DIAGNOSIS — Z20822 Contact with and (suspected) exposure to covid-19: Secondary | ICD-10-CM | POA: Insufficient documentation

## 2021-07-04 DIAGNOSIS — Z853 Personal history of malignant neoplasm of breast: Secondary | ICD-10-CM | POA: Diagnosis not present

## 2021-07-04 DIAGNOSIS — R059 Cough, unspecified: Secondary | ICD-10-CM | POA: Insufficient documentation

## 2021-07-04 DIAGNOSIS — Z7984 Long term (current) use of oral hypoglycemic drugs: Secondary | ICD-10-CM | POA: Insufficient documentation

## 2021-07-04 DIAGNOSIS — I10 Essential (primary) hypertension: Secondary | ICD-10-CM | POA: Insufficient documentation

## 2021-07-04 DIAGNOSIS — Z79899 Other long term (current) drug therapy: Secondary | ICD-10-CM | POA: Insufficient documentation

## 2021-07-04 NOTE — ED Triage Notes (Addendum)
Pt is present intermittent SOB x two days and worse this evening. Pt states when she tries to lie flat she "cant breath at all". Pt's breathing improves sitting up but still SOB. Pt also c/o dry cough as well. Pt admitted 05/25/21 for bronchitis but was told she has heart failure. Pt states sx are very similar to last admission. Afebrile. 98% RA.

## 2021-07-04 NOTE — ED Provider Notes (Signed)
[place] EMERGENCY DEPT Provider Note   CSN: [id number] Arrival date & time: 07/04/21  2321     History Chief Complaint  Patient presents with   Shortness of Breath    [name] is a 80 y.o. female.  HPI     This is an 80 year old female with a history of breast cancer, heart failure, hypertension who presents with shortness of breath.  Patient reports that she has had increasing shortness of breath since Friday.  She describes orthopnea.  She also describes cough that is occasionally productive.  No fevers.  No known sick contacts or COVID exposures.  She states she had a similar presentation several weeks ago and was told that she had heart failure.  She has not noted any lower extremity edema or weight gain.  She is not currently taking a diuretic.  She denies chest pain.  Past Medical History:  Diagnosis Date   Asthma    Breast CA ([place])    Cancer ([place])    right breast mastectomy   DDD (degenerative disc disease), cervical    H/O chest pain 03/24/2006   normal nuclear  study   Headache    High cholesterol    Hypertension    Mild aortic insufficiency 03/28/2006   echo   Normal cardiac stress test 2005   Personal history of chemotherapy    Personal history of radiation therapy    PONV (postoperative nausea and vomiting)    Sinus bradycardia on ECG    Stroke [facility])     Patient Active Problem List   Diagnosis Date Noted   Bronchitis 05/25/2021   Asthma exacerbation 05/25/2021   No-show for appointment 01/28/2020   Angina pectoris ([place])    History of stroke 06/13/2017   DOE (dyspnea on exertion) 05/24/2017   Occipital neuralgia of right side 11/02/2016   Essential hypertension 09/23/2015   Palpitations 06/17/2015   Cerebrovascular accident (CVA) due to occlusion of left middle cerebral artery ([place]) 06/17/2015   Hyperlipidemia 06/17/2015   HLD (hyperlipidemia)    Intracranial vascular stenosis    Cerebral infarction due to occlusion of left  middle cerebral artery (HCC)    CVA (cerebral vascular accident) ([place]) 04/27/2015   Stroke ([place]) 04/27/2015   Cephalalgia    Extrinsic asthma 07/02/2014   Migraine variant with status migrainosus 07/02/2014   Benign paroxysmal positional vertigo 07/02/2014   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/2014   Acute pancreatitis 03/03/2014   PUD (peptic ulcer disease) 03/03/2014   HTN (hypertension) 03/03/2014   Abdominal pain 03/03/2014   Nausea with vomiting 03/03/2014   Mild aortic insufficiency    H/O chest pain    Sinus bradycardia on ECG    G E R D 03/24/2008   ADENOCARCINOMA, BREAST, RIGHT 06/30/2007   ASTHMA 06/30/2007   Upper airway cough syndrome 06/30/2007    Past Surgical History:  Procedure Laterality Date   APPENDECTOMY     "30 years ago"   BREAST SURGERY     childbirth     x 2   CHOLECYSTECTOMY     "15 years ago"   ESOPHAGOGASTRODUODENOSCOPY N/A 03/04/2014   Procedure: ESOPHAGOGASTRODUODENOSCOPY (EGD);  Surgeon: [name], MD;  Location: [facility] ENDOSCOPY;  Service: Endoscopy;  Laterality: N/A;   MASTECTOMY  1993   right breast   OTHER SURGICAL HISTORY     hysterectomy   rectal fissure     RIGHT/LEFT HEART CATH AND CORONARY ANGIOGRAPHY N/A 05/30/2019   Procedure: RIGHT/LEFT HEART CATH AND CORONARY ANGIOGRAPHY;  Surgeon: [name], MD;  Location: [place] CV LAB;  Service: Cardiovascular;  Laterality: N/A;   TUBAL LIGATION       OB History     Gravida  2   Para  2   Term      Preterm      AB      Living  2      SAB      IAB      Ectopic      Multiple      Live Births              Family History  Problem Relation Age of Onset   Heart attack Mother    Heart disease Mother    Hypertension Mother    Asthma Mother    Heart attack Father    Heart disease Father    Hypertension Sister    Heart attack Sister    Cancer Sister 71       colon   Hypertension Sister    Heart attack Sister     Social History   Tobacco Use    Smoking status: Never   Smokeless tobacco: Never  Vaping Use   Vaping Use: Never used  Substance Use Topics   Alcohol use: No    Alcohol/week: 0.0 standard drinks   Drug use: No    Home Medications Prior to Admission medications   Medication Sig Start Date End Date Taking? Authorizing Provider  albuterol (VENTOLIN HFA) 108 (90 Base) MCG/ACT inhaler Inhale 1-2 puffs into the lungs every 4 (four) hours as needed for wheezing or shortness of breath. 05/29/21   Thurnell Lose, MD  amLODipine (NORVASC) 5 MG tablet Take 5 mg by mouth daily.    [provider]  atorvastatin (LIPITOR) 80 MG tablet Take 1 tablet (80 mg total) by mouth daily at 6 PM. 07/06/15   Rosalin Hawking, MD  blood glucose meter kit and supplies KIT Dispense based on patient and insurance preference. Use up to four times daily as directed. . For QAC - HS accuchecks. 05/31/21   Thurnell Lose, MD  carvedilol (COREG) 6.25 MG tablet Take 1 tablet (6.25 mg total) by mouth 2 (two) times daily with a meal. 05/29/21   Thurnell Lose, MD  clobetasol cream (TEMOVATE) 3.08 % Apply 1 application topically daily at 6 (six) AM. 12/23/20   [provider]  clonazePAM (KLONOPIN) 1 MG tablet Take 1 mg by mouth 2 (two) times daily as needed for anxiety. Patient not taking: Reported on 06/03/2021 08/24/20   [provider]  clopidogrel (PLAVIX) 75 MG tablet Take 1 tablet (75 mg total) by mouth daily. 06/01/17   Rosalin Hawking, MD  DULoxetine (CYMBALTA) 30 MG capsule Take 30 mg by mouth daily. 05/05/21   [provider]  Fluticasone-Salmeterol (ADVAIR) 250-50 MCG/DOSE AEPB Inhale 1 puff into the lungs 2 (two) times daily.    [provider]  furosemide (LASIX) 40 MG tablet Take 1 tablet (40 mg total) by mouth daily as needed for fluid or edema (If your weight is above 2.5 pounds from your baseline weight, weigh yourself daily at the same time). 05/29/21   Thurnell Lose, MD  glipiZIDE (GLUCOTROL) 5 MG  tablet Take 0.5 tablets (2.5 mg total) by mouth daily before breakfast. 05/31/21   Thurnell Lose, MD  isosorbide mononitrate (IMDUR) 30 MG 24 hr tablet TAKE 1 TABLET BY MOUTH  DAILY 01/14/21   [name], Wonda Cheng,  MD  loratadine (CLARITIN) 10 MG tablet Take 10 mg by mouth daily.    [provider]  metFORMIN (GLUCOPHAGE) 500 MG tablet Take 1 tablet (500 mg total) by mouth 2 (two) times daily with a meal. Patient not taking: Reported on 06/03/2021 05/30/21   Thurnell Lose, MD  montelukast (SINGULAIR) 10 MG tablet Take 1 tablet (10 mg total) by mouth at bedtime. 05/29/21   Thurnell Lose, MD  nitroGLYCERIN (NITROSTAT) 0.4 MG SL tablet Place 1 tablet (0.4 mg total) under the tongue every 5 (five) minutes as needed for chest pain. 05/07/19   [name], Wonda Cheng, MD    Allergies    Codeine, Doxycycline, Percocet [oxycodone-acetaminophen], and Tramadol  Review of Systems   Review of Systems  Constitutional:  Negative for fever.  Respiratory:  Positive for cough and shortness of breath.   Cardiovascular:  Negative for chest pain and leg swelling.  Gastrointestinal:  Negative for abdominal pain.  Genitourinary:  Negative for dysuria.  All other systems reviewed and are negative.  Physical Exam Updated Vital Signs BP 133/65   Pulse 75   Temp 98.5 F (36.9 C) (Oral)   Resp 16   Ht 1.651 m (_0 )   Wt 70.3 kg   SpO2 96%   BMI 25.79 kg/m   Physical Exam Vitals and nursing note reviewed.  Constitutional:      Appearance: She is well-developed. She is not ill-appearing.  HENT:     Head: Normocephalic and atraumatic.     Mouth/Throat:     Mouth: Mucous membranes are moist.  Eyes:     Pupils: Pupils are equal, round, and reactive to light.  Cardiovascular:     Rate and Rhythm: Normal rate and regular rhythm.     Heart sounds: Normal heart sounds.  Pulmonary:     Effort: Pulmonary effort is normal. No respiratory distress.     Breath sounds: No wheezing or rhonchi.   Abdominal:     General: Bowel sounds are normal.     Palpations: Abdomen is soft.  Musculoskeletal:     Cervical back: Neck supple.     Right lower leg: No edema.     Left lower leg: No tenderness. No edema.  Skin:    General: Skin is warm and dry.  Neurological:     General: No focal deficit present.     Mental Status: She is alert and oriented to person, place, and time.  Psychiatric:        Mood and Affect: Mood normal.    ED Results / Procedures / Treatments   Labs (all labs ordered are listed, but only abnormal results are displayed) Labs Reviewed  CBC WITH DIFFERENTIAL/PLATELET - Abnormal; Notable for the following components:      Result Value   RBC 3.82 (*)    Hemoglobin 11.4 (*)    HCT 33.1 (*)    All other components within normal limits  BASIC METABOLIC PANEL - Abnormal; Notable for the following components:   Potassium 3.3 (*)    Glucose, Bld 183 (*)    All other components within normal limits  D-DIMER, QUANTITATIVE - Abnormal; Notable for the following components:   D-Dimer, Quant 0.53 (*)    All other components within normal limits  RESP PANEL BY RT-PCR (FLU A&B, COVID) ARPGX2  BRAIN NATRIURETIC PEPTIDE    EKG EKG Interpretation  Date/Time:  Sunday July 04 2021 23:56:31 EDT Ventricular Rate:  76 PR Interval:  168 QRS Duration: 81  QT Interval:  391 QTC Calculation: 440 R Axis:   1 Text Interpretation: Sinus rhythm LVH with secondary repolarization abnormality Confirmed by Thayer Jew [phone]) on 07/05/2021 1:11:58 AM  Radiology DG Chest 2 View  Result Date: 07/05/2021 CLINICAL DATA:  Shortness of breath EXAM: CHEST - 2 VIEW COMPARISON:  06/03/2021 FINDINGS: Heart and mediastinal contours are within normal limits. No focal opacities or effusions. No acute bony abnormality. IMPRESSION: No active cardiopulmonary disease. Electronically Signed   By: Rolm Baptise M.D.   On: 07/05/2021 00:54    Procedures Procedures   Medications Ordered in  ED Medications  furosemide (LASIX) injection 20 mg (20 mg Intravenous Given 07/05/21 0250)    ED Course  I have reviewed the triage vital signs and the nursing notes.  Pertinent labs & imaging results that were available during my care of the patient were reviewed by me and considered in my medical decision making (see chart for details).    MDM Rules/Calculators/A&P                           Patient presents with shortness of breath and orthopnea.  She is nontoxic and vital signs are reassuring.  She is in no respiratory distress.  Satting 100% on room air.  She does not appear overtly volume overloaded at this time.  No wheezing or rails to suggest infectious etiology.  Does report cough worse with laying flat.  Considerations include but not limited to, pneumonia, infectious etiology, heart failure, less likely PE.  Labs obtained.  Chest x-ray shows no evidence of pneumothorax or pneumonia.  EKG without acute ischemic or arrhythmic changes.  Basic lab work-up is largely reassuring.  D-dimer is 0.53 which is negative when considering age adjustment.  Patient ambulated and was able to maintain pulse ox.  BNP is normal.  No obvious volume overload.  Given grade 1 diastolic dysfunction on prior echo, she was given 20 mg of Lasix to see if this helps at all given that her primary complaint is orthopnea.  Patient reports that she has follow-up with pulmonology on Tuesday.  Recommend outpatient follow-up as patient's work-up today is reassuring  After history, exam, and medical workup I feel the patient has been appropriately medically screened and is safe for discharge home. Pertinent diagnoses were discussed with the patient. Patient was given return precautions.  Final Clinical Impression(s) / ED Diagnoses Final diagnoses:  SOB (shortness of breath)    Rx / DC Orders ED Discharge Orders     None        Marcy Bogosian, Barbette Hair, MD 07/05/21 [phone]

## 2021-07-05 ENCOUNTER — Emergency Department (HOSPITAL_BASED_OUTPATIENT_CLINIC_OR_DEPARTMENT_OTHER): Payer: Medicare Other | Admitting: Radiology

## 2021-07-05 DIAGNOSIS — R0602 Shortness of breath: Secondary | ICD-10-CM | POA: Diagnosis not present

## 2021-07-05 DIAGNOSIS — J069 Acute upper respiratory infection, unspecified: Secondary | ICD-10-CM | POA: Diagnosis not present

## 2021-07-05 DIAGNOSIS — J209 Acute bronchitis, unspecified: Secondary | ICD-10-CM | POA: Diagnosis not present

## 2021-07-05 LAB — BASIC METABOLIC PANEL
Anion gap: 9 (ref 5–15)
BUN: 14 mg/dL (ref 8–23)
CO2: 24 mmol/L (ref 22–32)
Calcium: 9 mg/dL (ref 8.9–10.3)
Chloride: 104 mmol/L (ref 98–111)
Creatinine, Ser: 0.73 mg/dL (ref 0.44–1.00)
GFR, Estimated: >60 mL/min (ref >60)
Glucose, Bld: 183 mg/dL — ABNORMAL HIGH (ref 70–99)
Potassium: 3.3 mmol/L — ABNORMAL LOW (ref 3.5–5.1)
Sodium: 137 mmol/L (ref 135–145)

## 2021-07-05 LAB — D-DIMER, QUANTITATIVE: D-Dimer, Quant: 0.53 ug/mL-FEU — ABNORMAL HIGH (ref 0.00–0.50)

## 2021-07-05 LAB — CBC WITH DIFFERENTIAL/PLATELET
Abs Immature Granulocytes: 0.03 10*3/uL (ref 0.00–0.07)
Basophils Absolute: 0 10*3/uL (ref 0.0–0.1)
Basophils Relative: 1 %
Eosinophils Absolute: 0.1 10*3/uL (ref 0.0–0.5)
Eosinophils Relative: 1 %
HCT: 33.1 % — ABNORMAL LOW (ref 36.0–46.0)
Hemoglobin: 11.4 g/dL — ABNORMAL LOW (ref 12.0–15.0)
Immature Granulocytes: 0 %
Lymphocytes Relative: 15 %
Lymphs Abs: 1.3 10*3/uL (ref 0.7–4.0)
MCH: 29.8 pg (ref 26.0–34.0)
MCHC: 34.4 g/dL (ref 30.0–36.0)
MCV: 86.6 fL (ref 80.0–100.0)
Monocytes Absolute: 0.9 10*3/uL (ref 0.1–1.0)
Monocytes Relative: 10 %
Neutro Abs: 6.4 10*3/uL (ref 1.7–7.7)
Neutrophils Relative %: 73 %
Platelets: 186 10*3/uL (ref 150–400)
RBC: 3.82 MIL/uL — ABNORMAL LOW (ref 3.87–5.11)
RDW: 13.5 % (ref 11.5–15.5)
WBC: 8.7 10*3/uL (ref 4.0–10.5)
nRBC: 0 % (ref 0.0–0.2)

## 2021-07-05 LAB — RESP PANEL BY RT-PCR (FLU A&B, COVID) ARPGX2
Influenza A by PCR: NEGATIVE
Influenza B by PCR: NEGATIVE
SARS Coronavirus 2 by RT PCR: NEGATIVE

## 2021-07-05 LAB — BRAIN NATRIURETIC PEPTIDE: B Natriuretic Peptide: 53.9 pg/mL (ref 0.0–100.0)

## 2021-07-05 MED ORDER — FUROSEMIDE 10 MG/ML IJ SOLN
20.0000 mg | Freq: Once | INTRAMUSCULAR | Status: AC
  Administered 2021-07-05: 20 mg via INTRAVENOUS
  Filled 2021-07-05: qty 2

## 2021-07-05 NOTE — Discharge Instructions (Signed)
You were seen today for shortness of breath.  Your work-up today is reassuring.  The exact cause is unclear.  Follow-up with your primary physician and pulmonology as scheduled on Tuesday.

## 2021-07-06 ENCOUNTER — Encounter: Payer: Self-pay | Admitting: Primary Care

## 2021-07-06 ENCOUNTER — Ambulatory Visit (INDEPENDENT_AMBULATORY_CARE_PROVIDER_SITE_OTHER): Payer: Medicare Other | Admitting: Primary Care

## 2021-07-06 ENCOUNTER — Other Ambulatory Visit: Payer: Self-pay

## 2021-07-06 DIAGNOSIS — R058 Other specified cough: Secondary | ICD-10-CM

## 2021-07-06 DIAGNOSIS — M654 Radial styloid tenosynovitis [de Quervain]: Secondary | ICD-10-CM | POA: Diagnosis not present

## 2021-07-06 DIAGNOSIS — M1812 Unilateral primary osteoarthritis of first carpometacarpal joint, left hand: Secondary | ICD-10-CM | POA: Diagnosis not present

## 2021-07-06 DIAGNOSIS — M1990 Unspecified osteoarthritis, unspecified site: Secondary | ICD-10-CM | POA: Diagnosis not present

## 2021-07-06 MED ORDER — AZELASTINE HCL 0.1 % NA SOLN: 1.0000 | Freq: Two times a day (BID) | NASAL | 3 refills | Status: DC

## 2021-07-06 MED ORDER — AZITHROMYCIN 250 MG PO TABS: ORAL_TABLET | ORAL | 0 refills | Status: DC

## 2021-07-06 MED ORDER — FLUTICASONE PROPIONATE 50 MCG/ACT NA SUSP: 1.0000 | Freq: Every day | NASAL | 3 refills | Status: DC

## 2021-07-06 MED ORDER — PREDNISONE 10 MG PO TABS: ORAL_TABLET | ORAL | 0 refills | Status: DC

## 2021-07-06 NOTE — Assessment & Plan Note (Addendum)
-   Patient has a lot of throat clearing with associated post nasal drip. She had some wheezing on exam. Starting patient on Astelin/Fluticasone nasal spray daily and sending in Z-pack along prednisone course for acute exacerbation.

## 2021-07-06 NOTE — Assessment & Plan Note (Addendum)
-   Hx childhood asthma. Patient reports increased shortness of breath with exertion the last several months.  Currently on Advair as maintenance inhaler. Needs follow-up with Dr. Shearon Stalls, first available

## 2021-07-06 NOTE — Patient Instructions (Addendum)
Recommendatoins: Continue Advair 1 puff morning and evening Start Astelin nasal spray 1 puff per nostril twice daily Start Flonase nasal spray 1 puff per nostril daily  Take Delsym cough syrup twice daily Use tessalon perles three times a day  If voice hoarseness does not resolve needs to see ENT   Rx: Prednisone taper  Zpack as directed   Follow-up: First available with Dr. Shearon Stalls

## 2021-07-06 NOTE — Progress Notes (Signed)
_0  ID: [name], female    DOB: 08-01-1941, 80 y.o.   MRN: [id number]  Chief Complaint  Patient presents with   Follow-up    SOB/ loosing voice    Referring provider: Alroy Dust, L.Marlou Sa, MD  HPI: 80 year old female, never smoked. PMH significant for HTN, CVA, mild aortic insufficiency, asthma, UACS, GERD, hyperlipidemia. Former patient of Dr. Annamaria Boots, last seen by Dr. Shearon Stalls in January 2021 for new consult sob/cough.   Previous [facility] pulmonary encounter: 09/16/19- Dr. Shearon Stalls, Consult / Cough and Shortness of breath   Cough has been going on for 2 years has frequent throat clearing. Has not tried any medications for this was given prednisone by PCP at some point, not sure if it helped. Mostly dry but has scant yellow mucus. She coughs all day and it also comes up at night - it does wake her up at night. Feels its worse during the day. Not worse after eating.    She was given an albuterol by her PCP Dr. Alroy Dust. Has been on that about a year and a half - uses it less than once/week.  She feels immediate relief from that.    Doesn't take claritin although it is on her medication list. She has both albuterol and advair on her medication list but no longer takes advair. She thinks the advair helped but never made it go away completely, but she was just taking it as needed.  She also takes singulair - unsure why she is on that .    Takes protonix once a day - has been on that for a couple of years. Unsure if this is helping her cough.    She had childhood asthma.   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  Smoking history: never smoker   07/06/2021- Interim hx  Patient presents today for overdue follow-up. Patient was originally referred in 2020 for shortness of breath. Spirometry on 08/27/2019 showed mild restriction without BD response and mild diffusion defect.  She has a hx childhood asthma. She  saw cardiology in September 2022, she was noted to have wheezing bilaterally on exam and referred back to pulmonary. She was seen at [facility] ED on 07/04/21 for shortness of breath. CXR showed no active cardiopulmonary disease.  She coughs at all times of the day with frequent throat clearing. Breathing is worse the last several months with exertion. She is on Advair 250-30mg as maintence inhaler and Singulair 192mat bedtime. She uses albuterol rescue inhaler once a day.  Allergies  Allergen Reactions   Codeine Nausea And Vomiting   Doxycycline Nausea And Vomiting    REACTION: GI upset   Percocet [Oxycodone-Acetaminophen] Nausea And Vomiting   Tramadol     Other reaction(s): Other (See Comments)    Immunization History  Administered Date(s) Administered   Influenza, High Dose Seasonal PF 08/16/2019   Influenza,inj,Quad PF,6+ Mos 10/05/2017   Pneumococcal Conjugate-13 10/05/2017    Past Medical History:  Diagnosis Date   Asthma    [place]ast [place] ([facility]   [facility]cer [place]   right breast mastectomy   DDD (degenerative disc disease), cervical    H/O chest pain 03/24/2006   normal nuclear  study   Headache    High cholesterol    Hypertension    Mild aortic insufficiency 03/28/2006   echo   Normal cardiac stress test 2005   Personal history of chemotherapy  Personal history of radiation therapy    PONV (postoperative nausea and vomiting)    Sinus bradycardia on ECG    Stroke (HCC)     Tobacco History: Social History   Tobacco Use  Smoking Status Never  Smokeless Tobacco Never   Counseling given: Not Answered   Outpatient Medications Prior to Visit  Medication Sig Dispense Refill   albuterol (VENTOLIN HFA) 108 (90 Base) MCG/ACT inhaler Inhale 1-2 puffs into the lungs every 4 (four) hours as needed for wheezing or shortness of breath. 6.7 g 0   amLODipine (NORVASC) 5 MG tablet Take 5 mg by mouth daily.     atorvastatin (LIPITOR) 80 MG tablet Take 1 tablet (80 mg total) by  mouth daily at 6 PM. 30 tablet 3   blood glucose meter kit and supplies KIT Dispense based on patient and insurance preference. Use up to four times daily as directed. . For QAC - HS accuchecks. 1 each 0   carvedilol (COREG) 6.25 MG tablet Take 1 tablet (6.25 mg total) by mouth 2 (two) times daily with a meal. 60 tablet 0   clobetasol cream (TEMOVATE) 0.05 % Apply 1 application topically daily at 6 (six) AM.     clonazePAM (KLONOPIN) 1 MG tablet Take 1 mg by mouth 2 (two) times daily as needed for anxiety.     clopidogrel (PLAVIX) 75 MG tablet Take 1 tablet (75 mg total) by mouth daily. 90 tablet 2   DULoxetine (CYMBALTA) 30 MG capsule Take 30 mg by mouth daily.     Fluticasone-Salmeterol (ADVAIR) 250-50 MCG/DOSE AEPB Inhale 1 puff into the lungs 2 (two) times daily.     furosemide (LASIX) 40 MG tablet Take 1 tablet (40 mg total) by mouth daily as needed for fluid or edema (If your weight is above 2.5 pounds from your baseline weight, weigh yourself daily at the same time). 30 tablet 0   glipiZIDE (GLUCOTROL) 5 MG tablet Take 0.5 tablets (2.5 mg total) by mouth daily before breakfast. 30 tablet 0   isosorbide mononitrate (IMDUR) 30 MG 24 hr tablet TAKE 1 TABLET BY MOUTH  DAILY 90 tablet 0   loratadine (CLARITIN) 10 MG tablet Take 10 mg by mouth daily.     metFORMIN (GLUCOPHAGE) 500 MG tablet Take 1 tablet (500 mg total) by mouth 2 (two) times daily with a meal. 60 tablet 0   montelukast (SINGULAIR) 10 MG tablet Take 1 tablet (10 mg total) by mouth at bedtime. 30 tablet 0   nitroGLYCERIN (NITROSTAT) 0.4 MG SL tablet Place 1 tablet (0.4 mg total) under the tongue every 5 (five) minutes as needed for chest pain. 25 tablet 6   Facility-Administered Medications Prior to Visit  Medication Dose Route Frequency Provider Last Rate Last Admin   triamcinolone acetonide (KENALOG) 10 MG/ML injection 10 mg  10 mg Other Once Egerton, Kathryn P, DPM       Review of Systems  Review of Systems  Constitutional:  Negative.   HENT:  Positive for postnasal drip and voice change.   Respiratory:  Positive for cough.     Physical Exam  BP 138/78 (BP Location: Left Arm, Patient Position: Sitting, Cuff Size: Normal)   Pulse 83   Temp 99 F (37.2 C) (Oral)   Ht 5' 4" (1.626 m)   Wt 154 lb 3.2 oz (69.9 kg)   SpO2 95%   BMI 26.47 kg/m  Physical Exam Constitutional:      Appearance: Normal appearance.  HENT:       Head: Normocephalic and atraumatic.     Mouth/Throat:     Mouth: Mucous membranes are moist.     Pharynx: Oropharynx is clear.  Cardiovascular:     Rate and Rhythm: Normal rate and regular rhythm.  Pulmonary:     Effort: Pulmonary effort is normal.     Breath sounds: Wheezing present.     Comments: ? Rales middle lobe bilaterally  Musculoskeletal:        General: Normal range of motion.  Skin:    General: Skin is warm and dry.  Neurological:     General: No focal deficit present.     Mental Status: She is alert and oriented to person, place, and time. Mental status is at baseline.  Psychiatric:        Mood and Affect: Mood normal.        Behavior: Behavior normal.        Thought Content: Thought content normal.        Judgment: Judgment normal.     Lab Results:  CBC    Component Value Date/Time   WBC 8.7 07/05/2021 0002   RBC 3.82 (L) 07/05/2021 0002   HGB 11.4 (L) 07/05/2021 0002   HGB 12.5 05/27/2019 0812   HCT 33.1 (L) 07/05/2021 0002   HCT 37.6 05/27/2019 0812   PLT 186 07/05/2021 0002   PLT 237 05/27/2019 0812   MCV 86.6 07/05/2021 0002   MCV 91 05/27/2019 0812   MCH 29.8 07/05/2021 0002   MCHC 34.4 07/05/2021 0002   RDW 13.5 07/05/2021 0002   RDW 13.3 05/27/2019 0812   LYMPHSABS 1.3 07/05/2021 0002   MONOABS 0.9 07/05/2021 0002   EOSABS 0.1 07/05/2021 0002   BASOSABS 0.0 07/05/2021 0002    BMET    Component Value Date/Time   NA 137 07/05/2021 0002   NA 140 02/03/2021 0956   K 3.3 (L) 07/05/2021 0002   CL 104 07/05/2021 0002   CO2 24 07/05/2021  0002   GLUCOSE 183 (H) 07/05/2021 0002   BUN 14 07/05/2021 0002   BUN 8 02/03/2021 0956   CREATININE 0.73 07/05/2021 0002   CREATININE 0.90 11/19/2014 1404   CALCIUM 9.0 07/05/2021 0002   GFRNONAA >60 07/05/2021 0002   GFRAA 75 05/27/2019 0812    BNP    Component Value Date/Time   BNP 53.9 07/05/2021 0005    ProBNP No results found for: PROBNP  Imaging: DG Chest 2 View  Result Date: 07/05/2021 CLINICAL DATA:  Shortness of breath EXAM: CHEST - 2 VIEW COMPARISON:  06/03/2021 FINDINGS: Heart and mediastinal contours are within normal limits. No focal opacities or effusions. No acute bony abnormality. IMPRESSION: No active cardiopulmonary disease. Electronically Signed   By: Kevin  Dover M.D.   On: 07/05/2021 00:54     Assessment & Plan:   Upper airway cough syndrome - Patient has a lot of throat clearing with associated post nasal drip. She had some wheezing on exam. Starting patient on Astelin/Fluticasone nasal spray daily and sending in Z-pack along prednisone course for acute exacerbation.   ASTHMA - Hx childhood asthma. Patient reports increased shortness of breath with exertion the last several months.  Currently on Advair as maintenance inhaler. Needs follow-up with Dr. Desai, first available    Elizabeth W Walsh, NP 07/06/2021   

## 2021-07-13 DIAGNOSIS — J069 Acute upper respiratory infection, unspecified: Secondary | ICD-10-CM | POA: Diagnosis not present

## 2021-07-27 ENCOUNTER — Ambulatory Visit: Payer: Medicare Other | Admitting: Internal Medicine

## 2021-07-28 DIAGNOSIS — L820 Inflamed seborrheic keratosis: Secondary | ICD-10-CM | POA: Diagnosis not present

## 2021-07-28 DIAGNOSIS — T148XXA Other injury of unspecified body region, initial encounter: Secondary | ICD-10-CM | POA: Diagnosis not present

## 2021-07-30 ENCOUNTER — Other Ambulatory Visit: Payer: Self-pay | Admitting: Cardiovascular Disease

## 2021-08-04 DIAGNOSIS — Z20822 Contact with and (suspected) exposure to covid-19: Secondary | ICD-10-CM | POA: Diagnosis not present

## 2021-08-12 ENCOUNTER — Ambulatory Visit: Payer: Medicare Other | Admitting: Internal Medicine

## 2021-09-01 DIAGNOSIS — J069 Acute upper respiratory infection, unspecified: Secondary | ICD-10-CM | POA: Diagnosis not present

## 2021-09-01 DIAGNOSIS — J209 Acute bronchitis, unspecified: Secondary | ICD-10-CM | POA: Diagnosis not present

## 2021-09-02 DIAGNOSIS — M791 Myalgia, unspecified site: Secondary | ICD-10-CM | POA: Diagnosis not present

## 2021-09-02 DIAGNOSIS — J069 Acute upper respiratory infection, unspecified: Secondary | ICD-10-CM | POA: Diagnosis not present

## 2021-09-02 DIAGNOSIS — Z20822 Contact with and (suspected) exposure to covid-19: Secondary | ICD-10-CM | POA: Diagnosis not present

## 2021-09-29 DIAGNOSIS — I872 Venous insufficiency (chronic) (peripheral): Secondary | ICD-10-CM | POA: Diagnosis not present

## 2021-10-04 DIAGNOSIS — J069 Acute upper respiratory infection, unspecified: Secondary | ICD-10-CM | POA: Diagnosis not present

## 2021-10-04 DIAGNOSIS — Z20822 Contact with and (suspected) exposure to covid-19: Secondary | ICD-10-CM | POA: Diagnosis not present

## 2021-10-04 DIAGNOSIS — M791 Myalgia, unspecified site: Secondary | ICD-10-CM | POA: Diagnosis not present

## 2021-10-05 ENCOUNTER — Other Ambulatory Visit: Payer: Self-pay

## 2021-10-05 ENCOUNTER — Encounter: Payer: Self-pay | Admitting: Emergency Medicine

## 2021-10-05 ENCOUNTER — Ambulatory Visit
Admission: EM | Admit: 2021-10-05 | Discharge: 2021-10-05 | Disposition: A | Discharge status: Home / Self Care | Payer: Medicare Other | Attending: Family Medicine | Admitting: Family Medicine

## 2021-10-05 DIAGNOSIS — J22 Unspecified acute lower respiratory infection: Secondary | ICD-10-CM | POA: Diagnosis not present

## 2021-10-05 DIAGNOSIS — J4541 Moderate persistent asthma with (acute) exacerbation: Secondary | ICD-10-CM

## 2021-10-05 DIAGNOSIS — M1812 Unilateral primary osteoarthritis of first carpometacarpal joint, left hand: Secondary | ICD-10-CM | POA: Diagnosis not present

## 2021-10-05 MED ORDER — DEXAMETHASONE SODIUM PHOSPHATE 10 MG/ML IJ SOLN
10.0000 mg | Freq: Once | INTRAMUSCULAR | Status: AC
  Administered 2021-10-05: 13:00:00 10 mg via INTRAMUSCULAR

## 2021-10-05 MED ORDER — AZITHROMYCIN 250 MG PO TABS: ORAL_TABLET | ORAL | 0 refills | Status: DC

## 2021-10-05 NOTE — ED Triage Notes (Signed)
Symptom onset one week ago.  Reports using sudafed, dayquil, nyquil and no relief.  Patient has a cough, intermittently productive cough that is thick, "mayonnaise" color, "light yellow".  Patient has a headache.  Patient has sinus drainage

## 2021-10-05 NOTE — ED Provider Notes (Signed)
[facility]    CSN: [id number] Arrival date & time: 10/05/21  1213      History   Chief Complaint Chief Complaint  Patient presents with   Cough    HPI [name] is a 81 y.o. female.   Presenting today with 1 week history of progressively worsening productive cough, wheezing, chest tightness, shortness of breath.  Had some sinus drainage and scratchy throat initially as well.  States she has history of significant asthma and allergies and had which she gets like this her inhaler regimen is not helpful.  She is compliant with her allergy and inhaler regimen.  Denies fever, chills, chest pain, abdominal pain, nausea vomiting or diarrhea.  No new sick contacts recently.   Past Medical History:  Diagnosis Date   Asthma    Breast CA ([place])    Cancer ([place])    right breast mastectomy   DDD (degenerative disc disease), cervical    H/O chest pain 03/24/2006   normal nuclear  study   Headache    High cholesterol    Hypertension    Mild aortic insufficiency 03/28/2006   echo   Normal cardiac stress test 2005   Personal history of chemotherapy    Personal history of radiation therapy    PONV (postoperative nausea and vomiting)    Sinus bradycardia on ECG    Stroke [facility])     Patient Active Problem List   Diagnosis Date Noted   Bronchitis 05/25/2021   Asthma exacerbation 05/25/2021   No-show for appointment 01/28/2020   Angina pectoris ([place])    History of stroke 06/13/2017   DOE (dyspnea on exertion) 05/24/2017   Occipital neuralgia of right side 11/02/2016   Essential hypertension 09/23/2015   Palpitations 06/17/2015   Cerebrovascular accident (CVA) due to occlusion of left middle cerebral artery ([place]) 06/17/2015   Hyperlipidemia 06/17/2015   HLD (hyperlipidemia)    Intracranial vascular stenosis    Cerebral infarction due to occlusion of left middle cerebral artery (HCC)    CVA (cerebral vascular accident) ([place]) 04/27/2015   Stroke ([place]) 04/27/2015    Cephalalgia    Extrinsic asthma 07/02/2014   Migraine variant with status migrainosus 07/02/2014   Benign paroxysmal positional vertigo 07/02/2014   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/2014   Acute pancreatitis 03/03/2014   PUD (peptic ulcer disease) 03/03/2014   HTN (hypertension) 03/03/2014   Abdominal pain 03/03/2014   Nausea with vomiting 03/03/2014   Mild aortic insufficiency    H/O chest pain    Sinus bradycardia on ECG    G E R D 03/24/2008   ADENOCARCINOMA, BREAST, RIGHT 06/30/2007   ASTHMA 06/30/2007   Upper airway cough syndrome 06/30/2007    Past Surgical History:  Procedure Laterality Date   APPENDECTOMY     "30 years ago"   BREAST SURGERY     childbirth     x 2   CHOLECYSTECTOMY     "15 years ago"   ESOPHAGOGASTRODUODENOSCOPY N/A 03/04/2014   Procedure: ESOPHAGOGASTRODUODENOSCOPY (EGD);  Surgeon: [name], MD;  Location: [facility] ENDOSCOPY;  Service: Endoscopy;  Laterality: N/A;   MASTECTOMY  1993   right breast   OTHER SURGICAL HISTORY     hysterectomy   rectal fissure     RIGHT/LEFT HEART CATH AND CORONARY ANGIOGRAPHY N/A 05/30/2019   Procedure: RIGHT/LEFT HEART CATH AND CORONARY ANGIOGRAPHY;  Surgeon: [name], MD;  Location: [place] [facility];  Service: Cardiovascular;  Laterality: N/A;   TUBAL LIGATION  OB History     Gravida  2   Para  2   Term      Preterm      AB      Living  2      SAB      IAB      Ectopic      Multiple      Live Births             Home Medications    Prior to Admission medications   Medication Sig Start Date End Date Taking? Authorizing Provider  azithromycin (ZITHROMAX) 250 MG tablet Take first 2 tablets together, then 1 every day until finished. 10/05/21  Yes Volney American, PA-C  albuterol (VENTOLIN HFA) 108 (90 Base) MCG/ACT inhaler Inhale 1-2 puffs into the lungs every 4 (four) hours as needed for wheezing or shortness of breath. 05/29/21   Thurnell Lose, MD  amLODipine  (NORVASC) 5 MG tablet Take 5 mg by mouth daily.    [provider]  atorvastatin (LIPITOR) 80 MG tablet Take 1 tablet (80 mg total) by mouth daily at 6 PM. 07/06/15   Rosalin Hawking, MD  azelastine (ASTELIN) 0.1 % nasal spray Place 1 spray into both nostrils 2 (two) times daily. Use in each nostril as directed 07/06/21   Martyn Ehrich, NP  azithromycin [facility]) 250 MG tablet Zpack taper as directed Patient not taking: Reported on 10/05/2021 07/06/21   Martyn Ehrich, NP  blood glucose meter kit and supplies KIT Dispense based on patient and insurance preference. Use up to four times daily as directed. . For QAC - HS accuchecks. 05/31/21   Thurnell Lose, MD  carvedilol (COREG) 6.25 MG tablet Take 1 tablet (6.25 mg total) by mouth 2 (two) times daily with a meal. Patient not taking: Reported on 10/05/2021 05/29/21   Thurnell Lose, MD  clobetasol cream (TEMOVATE) 5.02 % Apply 1 application topically daily at 6 (six) AM. 12/23/20   [provider]  clonazePAM (KLONOPIN) 1 MG tablet Take 1 mg by mouth 2 (two) times daily as needed for anxiety. Patient not taking: Reported on 10/05/2021 08/24/20   [provider]  clopidogrel (PLAVIX) 75 MG tablet Take 1 tablet (75 mg total) by mouth daily. 06/01/17   Rosalin Hawking, MD  DULoxetine (CYMBALTA) 30 MG capsule Take 30 mg by mouth daily. 05/05/21   [provider]  fluticasone (FLONASE) 50 MCG/ACT nasal spray Place 1 spray into both nostrils daily. 07/06/21   Martyn Ehrich, NP  Fluticasone-Salmeterol (ADVAIR) 250-50 MCG/DOSE AEPB Inhale 1 puff into the lungs 2 (two) times daily.    [provider]  furosemide (LASIX) 40 MG tablet Take 1 tablet (40 mg total) by mouth daily as needed for fluid or edema (If your weight is above 2.5 pounds from your baseline weight, weigh yourself daily at the same time). Patient not taking: Reported on 10/05/2021 05/29/21   Thurnell Lose, MD  glipiZIDE (GLUCOTROL) 5 MG  tablet Take 0.5 tablets (2.5 mg total) by mouth daily before breakfast. Patient not taking: Reported on 10/05/2021 05/31/21   Thurnell Lose, MD  isosorbide mononitrate (IMDUR) 30 MG 24 hr tablet TAKE 1 TABLET BY MOUTH  DAILY 08/02/21   [name], Wonda Cheng, MD  loratadine (CLARITIN) 10 MG tablet Take 10 mg by mouth daily. Patient not taking: Reported on 10/05/2021    [provider]  metFORMIN (GLUCOPHAGE) 500 MG tablet Take 1 tablet (500 mg total)  by mouth 2 (two) times daily with a meal. Patient not taking: Reported on 10/05/2021 05/30/21   Thurnell Lose, MD  montelukast (SINGULAIR) 10 MG tablet Take 1 tablet (10 mg total) by mouth at bedtime. 05/29/21   Thurnell Lose, MD  nitroGLYCERIN (NITROSTAT) 0.4 MG SL tablet Place 1 tablet (0.4 mg total) under the tongue every 5 (five) minutes as needed for chest pain. 05/07/19   [name], Wonda Cheng, MD  predniSONE (DELTASONE) 10 MG tablet Take 2 tabs x 5 days Patient not taking: Reported on 10/05/2021 07/06/21   Martyn Ehrich, NP    Family History Family History  Problem Relation Age of Onset   Heart attack Mother    Heart disease Mother    Hypertension Mother    Asthma Mother    Heart attack Father    Heart disease Father    Hypertension Sister    Heart attack Sister    Cancer Sister 29       colon   Hypertension Sister    Heart attack Sister     Social History Social History   Tobacco Use   Smoking status: Never   Smokeless tobacco: Never  Vaping Use   Vaping Use: Never used  Substance Use Topics   Alcohol use: No    Alcohol/week: 0.0 standard drinks   Drug use: No     Allergies   Codeine, Doxycycline, Percocet [oxycodone-acetaminophen], and Tramadol   Review of Systems Review of Systems Per HPI  Physical Exam Triage Vital Signs ED Triage Vitals  Enc Vitals Group     BP 10/05/21 1251 (!) 148/77     Pulse Rate 10/05/21 1251 81     Resp 10/05/21 1251 (!) 23     Temp 10/05/21 1251 98.2 F (36.8 C)      Temp Source 10/05/21 1251 Oral     SpO2 10/05/21 1251 93 %     Weight --      Height --      Head Circumference --      Peak Flow --      Pain Score 10/05/21 1246 7     Pain Loc --      Pain Edu? --      Excl. in [place]? --    No data found.  Updated Vital Signs BP (!) 148/77 (BP Location: Left Arm)    Pulse 81    Temp 98.2 F (36.8 C) (Oral)    Resp (!) 23    SpO2 93%   Visual Acuity Right Eye Distance:   Left Eye Distance:   Bilateral Distance:    Right Eye Near:   Left Eye Near:    Bilateral Near:     Physical Exam Vitals and nursing note reviewed.  Constitutional:      Appearance: Normal appearance. She is not ill-appearing.  HENT:     Head: Atraumatic.     Right Ear: Tympanic membrane and external ear normal.     Left Ear: Tympanic membrane and external ear normal.     Nose: Congestion present.     Mouth/Throat:     Mouth: Mucous membranes are moist.     Pharynx: Posterior oropharyngeal erythema present.  Eyes:     Extraocular Movements: Extraocular movements intact.     Conjunctiva/sclera: Conjunctivae normal.  Cardiovascular:     Rate and Rhythm: Normal rate and regular rhythm.     Heart sounds: Normal heart sounds.  Pulmonary:     Effort: Pulmonary  effort is normal.     Breath sounds: Wheezing present. No rales.     Comments: Moderate diffuse wheezes bilaterally.  Breathing comfortably on room air.  Speaking in full sentences. Abdominal:     General: Bowel sounds are normal. There is no distension.     Palpations: Abdomen is soft.     Tenderness: There is no abdominal tenderness. There is no guarding.  Musculoskeletal:        General: Normal range of motion.     Cervical back: Normal range of motion and neck supple.  Skin:    General: Skin is warm and dry.  Neurological:     Mental Status: She is alert and oriented to person, place, and time.  Psychiatric:        Mood and Affect: Mood normal.        Thought Content: Thought content normal.         Judgment: Judgment normal.     UC Treatments / Results  Labs (all labs ordered are listed, but only abnormal results are displayed) Labs Reviewed - No data to display  EKG   Radiology No results found.  Procedures Procedures (including critical care time)  Medications Ordered in UC Medications  dexamethasone (DECADRON) injection 10 mg (10 mg Intramuscular Given 10/05/21 1323)    Initial Impression / Assessment and Plan / UC Course  I have reviewed the triage vital signs and the nursing notes.  Pertinent labs & imaging results that were available during my care of the patient were reviewed by me and considered in my medical decision making (see chart for details).     Oxygen saturation 93% on room air, patient appears in no respiratory distress.  Suspect significant asthma exacerbation from upper respiratory infection now leading into a lower respiratory infection.  Will treat with IM Decadron, azithromycin to cover for secondary bacterial infection, continued inhaler and allergy regimen, supportive over-the-counter medications for symptoms.  Discuss strict return precautions for any worsening symptoms.  Final Clinical Impressions(s) / UC Diagnoses   Final diagnoses:  Lower respiratory infection  Moderate persistent asthma with acute exacerbation   Discharge Instructions   None    ED Prescriptions     Medication Sig Dispense Auth. Provider   azithromycin (ZITHROMAX) 250 MG tablet Take first 2 tablets together, then 1 every day until finished. 6 tablet Volney American, [place]      PDMP not reviewed this encounter.   Volney American, [place] 10/05/21 1340

## 2021-10-18 ENCOUNTER — Other Ambulatory Visit: Payer: Self-pay | Admitting: Family Medicine

## 2021-10-18 ENCOUNTER — Ambulatory Visit
Admission: RE | Admit: 2021-10-18 | Discharge: 2021-10-18 | Disposition: A | Discharge status: Home / Self Care | Payer: Medicare Other | Source: Ambulatory Visit | Attending: Family Medicine | Admitting: Family Medicine

## 2021-10-18 DIAGNOSIS — R059 Cough, unspecified: Secondary | ICD-10-CM | POA: Diagnosis not present

## 2021-10-18 DIAGNOSIS — J45909 Unspecified asthma, uncomplicated: Secondary | ICD-10-CM | POA: Diagnosis not present

## 2021-10-18 DIAGNOSIS — R110 Nausea: Secondary | ICD-10-CM | POA: Diagnosis not present

## 2021-10-18 DIAGNOSIS — R7309 Other abnormal glucose: Secondary | ICD-10-CM | POA: Diagnosis not present

## 2021-10-21 ENCOUNTER — Inpatient Hospital Stay: Admission: RE | Admit: 2021-10-21 | Payer: Medicare Other | Source: Ambulatory Visit

## 2021-11-01 DIAGNOSIS — K5900 Constipation, unspecified: Secondary | ICD-10-CM | POA: Diagnosis not present

## 2021-11-01 DIAGNOSIS — J45909 Unspecified asthma, uncomplicated: Secondary | ICD-10-CM | POA: Diagnosis not present

## 2021-12-09 ENCOUNTER — Other Ambulatory Visit: Payer: Self-pay | Admitting: Family Medicine

## 2021-12-09 DIAGNOSIS — K5900 Constipation, unspecified: Secondary | ICD-10-CM | POA: Diagnosis not present

## 2021-12-09 DIAGNOSIS — R1013 Epigastric pain: Secondary | ICD-10-CM | POA: Diagnosis not present

## 2021-12-09 DIAGNOSIS — J45909 Unspecified asthma, uncomplicated: Secondary | ICD-10-CM | POA: Diagnosis not present

## 2021-12-09 DIAGNOSIS — R079 Chest pain, unspecified: Secondary | ICD-10-CM | POA: Diagnosis not present

## 2021-12-09 DIAGNOSIS — R7309 Other abnormal glucose: Secondary | ICD-10-CM | POA: Diagnosis not present

## 2021-12-09 DIAGNOSIS — R110 Nausea: Secondary | ICD-10-CM | POA: Diagnosis not present

## 2021-12-21 ENCOUNTER — Emergency Department (HOSPITAL_BASED_OUTPATIENT_CLINIC_OR_DEPARTMENT_OTHER)
Admission: EM | Admit: 2021-12-21 | Discharge: 2021-12-21 | Disposition: A | Discharge status: Home / Self Care | Payer: Medicare Other | Attending: Emergency Medicine | Admitting: Emergency Medicine

## 2021-12-21 ENCOUNTER — Encounter (HOSPITAL_BASED_OUTPATIENT_CLINIC_OR_DEPARTMENT_OTHER): Payer: Self-pay

## 2021-12-21 ENCOUNTER — Emergency Department (HOSPITAL_BASED_OUTPATIENT_CLINIC_OR_DEPARTMENT_OTHER): Payer: Medicare Other | Admitting: Radiology

## 2021-12-21 ENCOUNTER — Other Ambulatory Visit: Payer: Self-pay

## 2021-12-21 DIAGNOSIS — K5900 Constipation, unspecified: Secondary | ICD-10-CM

## 2021-12-21 DIAGNOSIS — E119 Type 2 diabetes mellitus without complications: Secondary | ICD-10-CM | POA: Insufficient documentation

## 2021-12-21 DIAGNOSIS — Z7902 Long term (current) use of antithrombotics/antiplatelets: Secondary | ICD-10-CM | POA: Diagnosis not present

## 2021-12-21 DIAGNOSIS — I10 Essential (primary) hypertension: Secondary | ICD-10-CM | POA: Diagnosis not present

## 2021-12-21 DIAGNOSIS — Z79899 Other long term (current) drug therapy: Secondary | ICD-10-CM | POA: Insufficient documentation

## 2021-12-21 DIAGNOSIS — J45909 Unspecified asthma, uncomplicated: Secondary | ICD-10-CM | POA: Diagnosis not present

## 2021-12-21 DIAGNOSIS — Z7951 Long term (current) use of inhaled steroids: Secondary | ICD-10-CM | POA: Diagnosis not present

## 2021-12-21 DIAGNOSIS — R110 Nausea: Secondary | ICD-10-CM | POA: Diagnosis not present

## 2021-12-21 DIAGNOSIS — K644 Residual hemorrhoidal skin tags: Secondary | ICD-10-CM | POA: Insufficient documentation

## 2021-12-21 MED ORDER — ONDANSETRON HCL 4 MG PO TABS: 4.0000 mg | ORAL_TABLET | Freq: Four times a day (QID) | ORAL | 0 refills | Status: AC

## 2021-12-21 MED ORDER — ONDANSETRON 4 MG PO TBDP
8.0000 mg | ORAL_TABLET | Freq: Once | ORAL | Status: AC
  Administered 2021-12-21: 8 mg via ORAL
  Filled 2021-12-21: qty 2

## 2021-12-21 NOTE — ED Provider Notes (Addendum)
?[facility] ?Provider Note ? ? ?CSN: [id number] ?Arrival date & time: 12/21/21  1506 ? ?  ? ?History ? ?Chief Complaint  ?Patient presents with  ? Constipation  ? ? ?[name] is a 81 y.o. female with a past medical history of HTN, T2DM and asthma presenting with the complaint of 3-4 days of constipation. She reports last full bowel movement was 4 days ago and that she usually goes every day. Has been trying milk of magnesia, dulcolax and fleet enemas without success. She says she feels like there is a large piece of stool blocking the exit of her rectum and that it has become increasingly painful. Denies passing gas. Reports that she has had a colonoscopy 20 years ago that was normal. No hx of IBD/IBS/CRC. When asked about her diet she says "I eat everything." Is not on any narcotic medication and no hx of abdominal surgeries. ? ?Constipation ?Associated symptoms: abdominal pain, diarrhea (""leaking") and nausea   ?Associated symptoms: no dysuria, no fever and no vomiting   ? ?  ? ?Home Medications ?Prior to Admission medications   ?Medication Sig Start Date End Date Taking? Authorizing Provider  ?albuterol (VENTOLIN HFA) 108 (90 Base) MCG/ACT inhaler Inhale 1-2 puffs into the lungs every 4 (four) hours as needed for wheezing or shortness of breath. 05/29/21   Thurnell Lose, MD  ?amLODipine (NORVASC) 5 MG tablet Take 5 mg by mouth daily.    [provider]  ?atorvastatin (LIPITOR) 80 MG tablet Take 1 tablet (80 mg total) by mouth daily at 6 PM. 07/06/15   Rosalin Hawking, MD  ?azelastine (ASTELIN) 0.1 % nasal spray Place 1 spray into both nostrils 2 (two) times daily. Use in each nostril as directed 07/06/21   Martyn Ehrich, NP  ?azithromycin (ZITHROMAX) 250 MG tablet Zpack taper as directed ?Patient not taking: Reported on 10/05/2021 07/06/21   Martyn Ehrich, NP  ?azithromycin (ZITHROMAX) 250 MG tablet Take first 2 tablets together, then 1 every day until finished.  10/05/21   Volney American, PA-C  ?blood glucose meter kit and supplies KIT Dispense based on patient and insurance preference. Use up to four times daily as directed. . For QAC - HS accuchecks. 05/31/21   Thurnell Lose, MD  ?carvedilol (COREG) 6.25 MG tablet Take 1 tablet (6.25 mg total) by mouth 2 (two) times daily with a meal. ?Patient not taking: Reported on 10/05/2021 05/29/21   Thurnell Lose, MD  ?clobetasol cream (TEMOVATE) 2.45 % Apply 1 application topically daily at 6 (six) AM. 12/23/20   [provider]  ?clonazePAM (KLONOPIN) 1 MG tablet Take 1 mg by mouth 2 (two) times daily as needed for anxiety. ?Patient not taking: Reported on 10/05/2021 08/24/20   [provider]  ?clopidogrel (PLAVIX) 75 MG tablet Take 1 tablet (75 mg total) by mouth daily. 06/01/17   Rosalin Hawking, MD  ?DULoxetine (CYMBALTA) 30 MG capsule Take 30 mg by mouth daily. 05/05/21   [provider]  ?fluticasone (FLONASE) 50 MCG/ACT nasal spray Place 1 spray into both nostrils daily. 07/06/21   Martyn Ehrich, NP  ?Fluticasone-Salmeterol (ADVAIR) 250-50 MCG/DOSE AEPB Inhale 1 puff into the lungs 2 (two) times daily.    [provider]  ?furosemide (LASIX) 40 MG tablet Take 1 tablet (40 mg total) by mouth daily as needed for fluid or edema (If your weight is above 2.5 pounds from your baseline weight, weigh yourself daily at the same time). ?Patient  not taking: Reported on 10/05/2021 05/29/21   Thurnell Lose, MD  ?glipiZIDE (GLUCOTROL) 5 MG tablet Take 0.5 tablets (2.5 mg total) by mouth daily before breakfast. ?Patient not taking: Reported on 10/05/2021 05/31/21   Thurnell Lose, MD  ?isosorbide mononitrate (IMDUR) 30 MG 24 hr tablet TAKE 1 TABLET BY MOUTH  DAILY 08/02/21   [name], Wonda Cheng, MD  ?loratadine (CLARITIN) 10 MG tablet Take 10 mg by mouth daily. ?Patient not taking: Reported on 10/05/2021    [provider]  ?metFORMIN (GLUCOPHAGE) 500 MG tablet Take 1 tablet (500 mg  total) by mouth 2 (two) times daily with a meal. ?Patient not taking: Reported on 10/05/2021 05/30/21   Thurnell Lose, MD  ?montelukast (SINGULAIR) 10 MG tablet Take 1 tablet (10 mg total) by mouth at bedtime. 05/29/21   Thurnell Lose, MD  ?nitroGLYCERIN (NITROSTAT) 0.4 MG SL tablet Place 1 tablet (0.4 mg total) under the tongue every 5 (five) minutes as needed for chest pain. 05/07/19   [name], Wonda Cheng, MD  ?predniSONE (DELTASONE) 10 MG tablet Take 2 tabs x 5 days ?Patient not taking: Reported on 10/05/2021 07/06/21   Martyn Ehrich, NP  ?   ? ?Allergies    ?Codeine, Doxycycline, Percocet [oxycodone-acetaminophen], and Tramadol   ? ?Review of Systems   ?Review of Systems  ?Constitutional:  Negative for chills and fever.  ?Gastrointestinal:  Positive for abdominal pain, constipation, diarrhea (""leaking"), nausea and rectal pain. Negative for anal bleeding, blood in stool and vomiting.  ?Genitourinary:  Negative for dysuria and vaginal pain.  ? ?Physical Exam ?Updated Vital Signs ?BP (!) 141/86 (BP Location: Left Arm)   Pulse 80   Temp 98.4 ?F (36.9 ?C)   Resp 20   SpO2 98%  ?Physical Exam ?Vitals and nursing note reviewed.  ?Constitutional:   ?   Appearance: Normal appearance.  ?HENT:  ?   Head: Normocephalic and atraumatic.  ?Eyes:  ?   General: No scleral icterus. ?   Conjunctiva/sclera: Conjunctivae normal.  ?Pulmonary:  ?   Effort: Pulmonary effort is normal. No respiratory distress.  ?Abdominal:  ?   General: Abdomen is flat.  ?   Palpations: Abdomen is soft.  ?   Tenderness: There is no guarding.  ?Genitourinary: ?   Comments: Rectal exam performed in presence of RT. She had one, uncomplicated external hemorrhoid. Patient had impacted stool just inside her anus that was approximately 2cm in diameter. I was able to manually remove this which resulted in the leakage of watery stool. No obvious hematochezia/melena.  ?Skin: ?   Findings: No rash.  ?Neurological:  ?   Mental Status: She is alert.   ?Psychiatric:     ?   Mood and Affect: Mood normal.  ? ? ?ED Results / Procedures / Treatments   ?Labs ?(all labs ordered are listed, but only abnormal results are displayed) ?Labs Reviewed - No data to display ? ?EKG ?None ? ?Radiology ?DG Abdomen 1 View ? ?Result Date: 12/21/2021 ?CLINICAL DATA:  Constipated for 3 days EXAM: ABDOMEN - 1 VIEW COMPARISON:  03/03/2014 FINDINGS: The bowel gas pattern is normal. No radio-opaque calculi or other significant radiographic abnormality are seen. Degenerative changes throughout the visualized thoracolumbar spine, pelvis and hips. No acute osseous finding. Remote cholecystectomy. Abdominal atherosclerosis present. Grossly normal colonic stool burden. IMPRESSION: Negative for obstruction or ileus. Overall normal colonic stool burden. Aortic Atherosclerosis (ICD10-I70.0). Electronically Signed   By: Jerilynn Mages.  Shick M.D.   On: 12/21/2021 17:22   ? ?  Procedures ?Fecal disimpaction ? ?Date/Time: 12/28/2021 12:39 PM ?Performed by: Rhae Hammock, PA-C ?Authorized by: Rhae Hammock, PA-C  ?Consent: Verbal consent obtained. ?Risks and benefits: risks, benefits and alternatives were discussed ?Consent given by: patient ?Patient understanding: patient states understanding of the procedure being performed ?Imaging studies: imaging studies available ?Patient identity confirmed: verbally with patient ?Local anesthesia used: no ? ?Anesthesia: ?Local anesthesia used: no ? ?Sedation: ?Patient sedated: no ? ?Patient tolerance: patient tolerated the procedure well with no immediate complications ? ?  ? ?Medications Ordered in ED ?Medications  ?ondansetron (ZOFRAN-ODT) disintegrating tablet 8 mg (8 mg Oral Given 12/21/21 1529)  ? ? ?ED Course/ Medical Decision Making/ A&P ?  ?                        ?Medical Decision Making ?Amount and/or Complexity of Data Reviewed ?Radiology: ordered. ? ?Risk ?Prescription drug management. ? ? ?81 year-old female presenting with constipation. Etiologies for  differential include but are not limited to, dehydration, electrolyte abnormality, medication side effect, poor diet, bowel obstruction, CRC.  ? ?Imaging: Plain view ordered and viewed by me and I note no obstruct

## 2021-12-21 NOTE — ED Notes (Signed)
At bedside with PA as a chaperone.  ?

## 2021-12-21 NOTE — ED Triage Notes (Signed)
Pt presents with constipation x3 days. Taking OTC Doculax, metamucil and fleet enema w/no relief. Pt states, "It's right there where it should come out but it can't come out" pt also reports she's unable to get the enema inside of her rectum d/t the stool  ?Pt c/o nausea  ?

## 2021-12-21 NOTE — Discharge Instructions (Addendum)
We removed a large amount of stool from your rectum today.  Please use the enemas you have been using as well as suppositories that you have for your constipation.  Follow-up with your primary care provider if you continue to have constipation ? ?Read the information about constipation attached to these papers.  Increase the fiber in your diet and please speak with your primary care provider if dietary changes do not help. ?

## 2021-12-23 ENCOUNTER — Other Ambulatory Visit: Payer: Self-pay

## 2021-12-23 ENCOUNTER — Emergency Department (HOSPITAL_BASED_OUTPATIENT_CLINIC_OR_DEPARTMENT_OTHER)
Admission: EM | Admit: 2021-12-23 | Discharge: 2021-12-23 | Disposition: A | Discharge status: Home / Self Care | Payer: Medicare Other | Attending: Emergency Medicine | Admitting: Emergency Medicine

## 2021-12-23 ENCOUNTER — Encounter (HOSPITAL_BASED_OUTPATIENT_CLINIC_OR_DEPARTMENT_OTHER): Payer: Self-pay | Admitting: Emergency Medicine

## 2021-12-23 ENCOUNTER — Emergency Department (HOSPITAL_BASED_OUTPATIENT_CLINIC_OR_DEPARTMENT_OTHER): Payer: Medicare Other

## 2021-12-23 DIAGNOSIS — K644 Residual hemorrhoidal skin tags: Secondary | ICD-10-CM | POA: Diagnosis not present

## 2021-12-23 DIAGNOSIS — Z7902 Long term (current) use of antithrombotics/antiplatelets: Secondary | ICD-10-CM | POA: Insufficient documentation

## 2021-12-23 DIAGNOSIS — Z853 Personal history of malignant neoplasm of breast: Secondary | ICD-10-CM | POA: Diagnosis not present

## 2021-12-23 DIAGNOSIS — K5900 Constipation, unspecified: Secondary | ICD-10-CM | POA: Diagnosis not present

## 2021-12-23 DIAGNOSIS — I700 Atherosclerosis of aorta: Secondary | ICD-10-CM | POA: Diagnosis not present

## 2021-12-23 DIAGNOSIS — N2889 Other specified disorders of kidney and ureter: Secondary | ICD-10-CM | POA: Diagnosis not present

## 2021-12-23 DIAGNOSIS — I10 Essential (primary) hypertension: Secondary | ICD-10-CM | POA: Diagnosis not present

## 2021-12-23 DIAGNOSIS — Z79899 Other long term (current) drug therapy: Secondary | ICD-10-CM | POA: Diagnosis not present

## 2021-12-23 DIAGNOSIS — K6289 Other specified diseases of anus and rectum: Secondary | ICD-10-CM | POA: Diagnosis not present

## 2021-12-23 DIAGNOSIS — N281 Cyst of kidney, acquired: Secondary | ICD-10-CM | POA: Diagnosis not present

## 2021-12-23 LAB — COMPREHENSIVE METABOLIC PANEL
ALT: 15 U/L (ref 0–44)
AST: 14 U/L — ABNORMAL LOW (ref 15–41)
Albumin: 4.4 g/dL (ref 3.5–5.0)
Alkaline Phosphatase: 67 U/L (ref 38–126)
Anion gap: 10 (ref 5–15)
BUN: 16 mg/dL (ref 8–23)
CO2: 29 mmol/L (ref 22–32)
Calcium: 9.4 mg/dL (ref 8.9–10.3)
Chloride: 98 mmol/L (ref 98–111)
Creatinine, Ser: 0.88 mg/dL (ref 0.44–1.00)
GFR, Estimated: >60 mL/min (ref >60)
Glucose, Bld: 197 mg/dL — ABNORMAL HIGH (ref 70–99)
Potassium: 4.3 mmol/L (ref 3.5–5.1)
Sodium: 137 mmol/L (ref 135–145)
Total Bilirubin: 0.7 mg/dL (ref 0.3–1.2)
Total Protein: 7.1 g/dL (ref 6.5–8.1)

## 2021-12-23 LAB — CBC
HCT: 40.7 % (ref 36.0–46.0)
Hemoglobin: 13.9 g/dL (ref 12.0–15.0)
MCH: 29.8 pg (ref 26.0–34.0)
MCHC: 34.2 g/dL (ref 30.0–36.0)
MCV: 87.3 fL (ref 80.0–100.0)
Platelets: 256 10*3/uL (ref 150–400)
RBC: 4.66 MIL/uL (ref 3.87–5.11)
RDW: 13.6 % (ref 11.5–15.5)
WBC: 9.3 10*3/uL (ref 4.0–10.5)
nRBC: 0 % (ref 0.0–0.2)

## 2021-12-23 MED ORDER — AMOXICILLIN-POT CLAVULANATE 875-125 MG PO TABS: 1.0000 | ORAL_TABLET | Freq: Two times a day (BID) | ORAL | 0 refills | Status: DC

## 2021-12-23 MED ORDER — IOHEXOL 300 MG/ML  SOLN
100.0000 mL | Freq: Once | INTRAMUSCULAR | Status: AC | PRN
  Administered 2021-12-23: 85 mL via INTRAVENOUS

## 2021-12-23 NOTE — Discharge Instructions (Addendum)
You were seen in the emergency department today for rectal pain and constipation. ? ?As we discussed your scan showed no masses, but it did comment on something called proctitis.  This is inflammation of the mucous membrane in your rectum.  We treat this very similarly to colitis and diverticulitis, with antibiotics ? ?I recommend that you hold off on the enemas at home, and work on treating your constipation with oral methods.  We do not want to irritate your rectum any more than it already has been. ? ?It is also important to follow-up with your primary doctor about your symptoms. ? ?Continue to monitor how you're doing and return to the ER for new or worsening symptoms.  ?

## 2021-12-23 NOTE — ED Provider Notes (Signed)
?[facility] ?Provider Note ? ? ?CSN: [id number] ?Arrival date & time: 12/23/21  1223 ? ?  ? ?History ? ?Chief Complaint  ?Patient presents with  ? Rectal Pain  ? ? ?[name] is a 81 y.o. female who presents emergency department complaining of constipation and rectal pain for about 1 week.  Patient was at this facility on Monday for constipation.  She had a manual disimpaction, and had a bowel movement after that.  Patient states that since then she has had more pressure and pain in her rectum, and is continued to be constipated.  She has been trying MiraLAX, Dulcolax, milk of magnesia at home.  She did have a bowel movement when she first got here today.  No fever, chills.  Has had some nausea, improved with Zofran. ? ?HPI ? ?  ? ?Home Medications ?Prior to Admission medications   ?Medication Sig Start Date End Date Taking? Authorizing Provider  ?amoxicillin-clavulanate (AUGMENTIN) 875-125 MG tablet Take 1 tablet by mouth every 12 (twelve) hours. 12/23/21  Yes Belem Hintze T, PA-C  ?albuterol (VENTOLIN HFA) 108 (90 Base) MCG/ACT inhaler Inhale 1-2 puffs into the lungs every 4 (four) hours as needed for wheezing or shortness of breath. 05/29/21   Thurnell Lose, MD  ?amLODipine (NORVASC) 5 MG tablet Take 5 mg by mouth daily.    [provider]  ?atorvastatin (LIPITOR) 80 MG tablet Take 1 tablet (80 mg total) by mouth daily at 6 PM. 07/06/15   Rosalin Hawking, MD  ?azelastine (ASTELIN) 0.1 % nasal spray Place 1 spray into both nostrils 2 (two) times daily. Use in each nostril as directed 07/06/21   Martyn Ehrich, NP  ?azithromycin (ZITHROMAX) 250 MG tablet Zpack taper as directed ?Patient not taking: Reported on 10/05/2021 07/06/21   Martyn Ehrich, NP  ?azithromycin (ZITHROMAX) 250 MG tablet Take first 2 tablets together, then 1 every day until finished. 10/05/21   Volney American, PA-C  ?blood glucose meter kit and supplies KIT Dispense based on patient and  insurance preference. Use up to four times daily as directed. . For QAC - HS accuchecks. 05/31/21   Thurnell Lose, MD  ?carvedilol (COREG) 6.25 MG tablet Take 1 tablet (6.25 mg total) by mouth 2 (two) times daily with a meal. ?Patient not taking: Reported on 10/05/2021 05/29/21   Thurnell Lose, MD  ?clobetasol cream (TEMOVATE) 8.76 % Apply 1 application topically daily at 6 (six) AM. 12/23/20   [provider]  ?clonazePAM (KLONOPIN) 1 MG tablet Take 1 mg by mouth 2 (two) times daily as needed for anxiety. ?Patient not taking: Reported on 10/05/2021 08/24/20   [provider]  ?clopidogrel (PLAVIX) 75 MG tablet Take 1 tablet (75 mg total) by mouth daily. 06/01/17   Rosalin Hawking, MD  ?DULoxetine (CYMBALTA) 30 MG capsule Take 30 mg by mouth daily. 05/05/21   [provider]  ?fluticasone (FLONASE) 50 MCG/ACT nasal spray Place 1 spray into both nostrils daily. 07/06/21   Martyn Ehrich, NP  ?Fluticasone-Salmeterol (ADVAIR) 250-50 MCG/DOSE AEPB Inhale 1 puff into the lungs 2 (two) times daily.    [provider]  ?furosemide (LASIX) 40 MG tablet Take 1 tablet (40 mg total) by mouth daily as needed for fluid or edema (If your weight is above 2.5 pounds from your baseline weight, weigh yourself daily at the same time). ?Patient not taking: Reported on 10/05/2021 05/29/21   Thurnell Lose, MD  ?glipiZIDE (GLUCOTROL) 5 MG  tablet Take 0.5 tablets (2.5 mg total) by mouth daily before breakfast. ?Patient not taking: Reported on 10/05/2021 05/31/21   Thurnell Lose, MD  ?isosorbide mononitrate (IMDUR) 30 MG 24 hr tablet TAKE 1 TABLET BY MOUTH  DAILY 08/02/21   [name], Wonda Cheng, MD  ?loratadine (CLARITIN) 10 MG tablet Take 10 mg by mouth daily. ?Patient not taking: Reported on 10/05/2021    [provider]  ?metFORMIN (GLUCOPHAGE) 500 MG tablet Take 1 tablet (500 mg total) by mouth 2 (two) times daily with a meal. ?Patient not taking: Reported on 10/05/2021 05/30/21   Thurnell Lose, MD  ?montelukast (SINGULAIR) 10 MG tablet Take 1 tablet (10 mg total) by mouth at bedtime. 05/29/21   Thurnell Lose, MD  ?nitroGLYCERIN (NITROSTAT) 0.4 MG SL tablet Place 1 tablet (0.4 mg total) under the tongue every 5 (five) minutes as needed for chest pain. 05/07/19   [name], Wonda Cheng, MD  ?ondansetron (ZOFRAN) 4 MG tablet Take 1 tablet (4 mg total) by mouth every 6 (six) hours for 7 days. 12/21/21 12/28/21  Redwine, Madison A, PA-C  ?predniSONE (DELTASONE) 10 MG tablet Take 2 tabs x 5 days ?Patient not taking: Reported on 10/05/2021 07/06/21   Martyn Ehrich, NP  ?   ? ?Allergies    ?Codeine, Doxycycline, Percocet [oxycodone-acetaminophen], and Tramadol   ? ?Review of Systems   ?Review of Systems  ?Constitutional:  Negative for fever.  ?Gastrointestinal:  Positive for constipation and nausea. Negative for abdominal pain, blood in stool and vomiting.  ?     Rectal pain  ?Genitourinary:  Negative for dysuria.  ?All other systems reviewed and are negative. ? ?Physical Exam ?Updated Vital Signs ?BP (!) 182/83   Pulse (!) 56   Temp 98.4 ?F (36.9 ?C) (Oral)   Resp 18   Ht '5\' 5"'  (1.651 m)   Wt 70.8 kg   SpO2 98%   BMI 25.96 kg/m?  ?Physical Exam ?Vitals and nursing note reviewed. Exam conducted with a chaperone present.  ?Constitutional:   ?   Appearance: Normal appearance.  ?HENT:  ?   Head: Normocephalic and atraumatic.  ?Eyes:  ?   Conjunctiva/sclera: Conjunctivae normal.  ?Cardiovascular:  ?   Rate and Rhythm: Normal rate and regular rhythm.  ?Pulmonary:  ?   Effort: Pulmonary effort is normal. No respiratory distress.  ?   Breath sounds: Normal breath sounds.  ?Abdominal:  ?   General: There is no distension.  ?   Palpations: Abdomen is soft.  ?   Tenderness: There is no abdominal tenderness.  ?Genitourinary: ?   Rectum: Tenderness and external hemorrhoid present. No mass. Normal anal tone.  ?   Comments: Complicated external hemorrhoid.  No impacted stool in vault. ?Skin: ?   General: Skin  is warm and dry.  ?Neurological:  ?   General: No focal deficit present.  ?   Mental Status: She is alert.  ? ? ?ED Results / Procedures / Treatments   ?Labs ?(all labs ordered are listed, but only abnormal results are displayed) ?Labs Reviewed  ?COMPREHENSIVE METABOLIC PANEL - Abnormal; Notable for the following components:  ?    Result Value  ? Glucose, Bld 197 (*)   ? AST 14 (*)   ? All other components within normal limits  ?CBC  ? ? ?EKG ?None ? ?Radiology ?DG Abdomen 1 View ? ?Result Date: 12/21/2021 ?CLINICAL DATA:  Constipated for 3 days EXAM: ABDOMEN - 1 VIEW COMPARISON:  03/03/2014 FINDINGS:  The bowel gas pattern is normal. No radio-opaque calculi or other significant radiographic abnormality are seen. Degenerative changes throughout the visualized thoracolumbar spine, pelvis and hips. No acute osseous finding. Remote cholecystectomy. Abdominal atherosclerosis present. Grossly normal colonic stool burden. IMPRESSION: Negative for obstruction or ileus. Overall normal colonic stool burden. Aortic Atherosclerosis (ICD10-I70.0). Electronically Signed   By: Jerilynn Mages.  Shick M.D.   On: 12/21/2021 17:22  ? ?CT ABDOMEN PELVIS W CONTRAST ? ?Result Date: 12/23/2021 ?CLINICAL DATA:  Constipation, unintentional weight gain. History of cholecystectomy, appendectomy EXAM: CT ABDOMEN AND PELVIS WITH CONTRAST TECHNIQUE: Multidetector CT imaging of the abdomen and pelvis was performed using the standard protocol following bolus administration of intravenous contrast. RADIATION DOSE REDUCTION: This exam was performed according to the departmental dose-optimization program which includes automated exposure control, adjustment of the mA and/or kV according to patient size and/or use of iterative reconstruction technique. CONTRAST:  11m OMNIPAQUE IOHEXOL 300 MG/ML  SOLN COMPARISON:  CT abdomen/pelvis 03/03/2014 FINDINGS: Lower chest: The lung bases are clear. The imaged heart is unremarkable. A right breast implant is noted.  Hepatobiliary: The liver is unremarkable. The gallbladder is surgically absent. There is no biliary ductal dilatation. Pancreas: Unremarkable. Spleen: Unremarkable. Adrenals/Urinary Tract: The adrenals are unremarkable. A hyp

## 2021-12-23 NOTE — ED Notes (Signed)
Patient transported to CT 

## 2021-12-23 NOTE — ED Triage Notes (Signed)
Pt was here Tuesday for constipation. Today Pt reports she can only have a bowel movement with an enema. Pt had a bowel movement when she arrived here today. Pt also reports rectal pain from impaction. ?

## 2021-12-27 DIAGNOSIS — R6881 Early satiety: Secondary | ICD-10-CM | POA: Diagnosis not present

## 2021-12-27 DIAGNOSIS — M545 Low back pain, unspecified: Secondary | ICD-10-CM | POA: Diagnosis not present

## 2021-12-27 DIAGNOSIS — K6289 Other specified diseases of anus and rectum: Secondary | ICD-10-CM | POA: Diagnosis not present

## 2021-12-27 DIAGNOSIS — R195 Other fecal abnormalities: Secondary | ICD-10-CM | POA: Diagnosis not present

## 2022-01-03 ENCOUNTER — Other Ambulatory Visit: Payer: Medicare Other

## 2022-01-18 ENCOUNTER — Encounter: Payer: Self-pay | Admitting: Emergency Medicine

## 2022-01-18 ENCOUNTER — Ambulatory Visit
Admission: EM | Admit: 2022-01-18 | Discharge: 2022-01-18 | Disposition: A | Discharge status: Home / Self Care | Payer: Medicare Other | Attending: Nurse Practitioner | Admitting: Nurse Practitioner

## 2022-01-18 DIAGNOSIS — H60502 Unspecified acute noninfective otitis externa, left ear: Secondary | ICD-10-CM

## 2022-01-18 DIAGNOSIS — H1033 Unspecified acute conjunctivitis, bilateral: Secondary | ICD-10-CM

## 2022-01-18 MED ORDER — TOBRAMYCIN-DEXAMETHASONE 0.3-0.1 % OP SUSP: 1.0000 [drp] | OPHTHALMIC | 0 refills | Status: AC

## 2022-01-18 MED ORDER — CIPROFLOXACIN-DEXAMETHASONE 0.3-0.1 % OT SUSP: 4.0000 [drp] | Freq: Two times a day (BID) | OTIC | 0 refills | Status: AC

## 2022-01-18 NOTE — ED Provider Notes (Signed)
?[facility] ? ? ? ?CSN: [id number] ?Arrival date & time: 01/18/22  0827 ? ? ?  ? ?History   ?Chief Complaint ?No chief complaint on file. ? ? ?HPI ?[name] is a 81 y.o. female.  ? ?The patient is AN a 81 year old female who presents with bilateral tearing and drainage in her eyes and left ear pain.  Symptoms started 1 day ago.  She states that she has also had swelling in the left eye that has improved after using a cold compress.  She states the eyes are itching and burning.  She also complains of pain to the left ear.  She states the pain is present when she touches the ear and she can feel it radiate into her face.  She denies fever, chills, nasal congestion, runny nose, blurred vision, decreased vision, decreased hearing, or ear drainage or cough.  She has not taken any medication for her symptoms. ? ?The history is provided by the patient.  ? ?Past Medical History:  ?Diagnosis Date  ? Asthma   ? Breast CA ([place])   ? Cancer [facility])   ? right breast mastectomy  ? DDD (degenerative disc disease), cervical   ? H/O chest pain 03/24/2006  ? normal nuclear  study  ? Headache   ? High cholesterol   ? Hypertension   ? Mild aortic insufficiency 03/28/2006  ? echo  ? Normal cardiac stress test 2005  ? Personal history of chemotherapy   ? Personal history of radiation therapy   ? PONV (postoperative nausea and vomiting)   ? Sinus bradycardia on ECG   ? Stroke [facility])   ? ? ?Patient Active Problem List  ? Diagnosis Date Noted  ? Bronchitis 05/25/2021  ? Asthma exacerbation 05/25/2021  ? No-show for appointment 01/28/2020  ? Angina pectoris ([place])   ? History of stroke 06/13/2017  ? DOE (dyspnea on exertion) 05/24/2017  ? Occipital neuralgia of right side 11/02/2016  ? Essential hypertension 09/23/2015  ? Palpitations 06/17/2015  ? Cerebrovascular accident (CVA) due to occlusion of left middle cerebral artery ([place]) 06/17/2015  ? Hyperlipidemia 06/17/2015  ? HLD (hyperlipidemia)   ? Intracranial vascular stenosis   ?  Cerebral infarction due to occlusion of left middle cerebral artery ([place])   ? CVA (cerebral vascular accident) ([place]) 04/27/2015  ? Stroke ([place]) 04/27/2015  ? Cephalalgia   ? Extrinsic asthma 07/02/2014  ? Migraine variant with status migrainosus 07/02/2014  ? Benign paroxysmal positional vertigo 07/02/2014  ? 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/2014  ? Acute pancreatitis 03/03/2014  ? PUD (peptic ulcer disease) 03/03/2014  ? HTN (hypertension) 03/03/2014  ? Abdominal pain 03/03/2014  ? Nausea with vomiting 03/03/2014  ? Mild aortic insufficiency   ? H/O chest pain   ? Sinus bradycardia on ECG   ? G E R D 03/24/2008  ? ADENOCARCINOMA, BREAST, RIGHT 06/30/2007  ? ASTHMA 06/30/2007  ? Upper airway cough syndrome 06/30/2007  ? ? ?Past Surgical History:  ?Procedure Laterality Date  ? APPENDECTOMY    ? "30 years ago"  ? BREAST SURGERY    ? childbirth    ? x 2  ? CHOLECYSTECTOMY    ? "15 years ago"  ? ESOPHAGOGASTRODUODENOSCOPY N/A 03/04/2014  ? Procedure: ESOPHAGOGASTRODUODENOSCOPY (EGD);  Surgeon: [name], MD;  Location: [facility] ENDOSCOPY;  Service: Endoscopy;  Laterality: N/A;  ? MASTECTOMY  1993  ? right breast  ? OTHER SURGICAL HISTORY    ? hysterectomy  ? rectal fissure    ?  RIGHT/LEFT HEART CATH AND CORONARY ANGIOGRAPHY N/A 05/30/2019  ? Procedure: RIGHT/LEFT HEART CATH AND CORONARY ANGIOGRAPHY;  Surgeon: [name], MD;  Location: [place] CV LAB;  Service: Cardiovascular;  Laterality: N/A;  ? TUBAL LIGATION    ? ? ?OB History   ? ? Gravida  ?2  ? Para  ?2  ? Term  ?   ? Preterm  ?   ? AB  ?   ? Living  ?2  ?  ? ? SAB  ?   ? IAB  ?   ? Ectopic  ?   ? Multiple  ?   ? Live Births  ?   ?   ?  ?  ? ? ? ?Home Medications   ? ?Prior to Admission medications   ?Medication Sig Start Date End Date Taking? Authorizing Provider  ?ciprofloxacin-dexamethasone (CIPRODEX) OTIC suspension Place 4 drops into the left ear 2 (two) times daily for 7 days. 01/18/22 01/25/22 Yes Chanc Kervin-Warren, Alda Lea, NP   ?tobramycin-dexamethasone [facility]) ophthalmic solution Place 1 drop into both eyes every 4 (four) hours while awake for 7 days. 01/18/22 01/25/22 Yes Makaylynn Bonillas-Warren, Alda Lea, NP  ?albuterol (VENTOLIN HFA) 108 (90 Base) MCG/ACT inhaler Inhale 1-2 puffs into the lungs every 4 (four) hours as needed for wheezing or shortness of breath. 05/29/21   Thurnell Lose, MD  ?amLODipine (NORVASC) 5 MG tablet Take 5 mg by mouth daily.    [provider]  ?amoxicillin-clavulanate (AUGMENTIN) 875-125 MG tablet Take 1 tablet by mouth every 12 (twelve) hours. 12/23/21   Roemhildt, Lorin T, PA-C  ?atorvastatin (LIPITOR) 80 MG tablet Take 1 tablet (80 mg total) by mouth daily at 6 PM. 07/06/15   Rosalin Hawking, MD  ?azelastine (ASTELIN) 0.1 % nasal spray Place 1 spray into both nostrils 2 (two) times daily. Use in each nostril as directed 07/06/21   Martyn Ehrich, NP  ?azithromycin (ZITHROMAX) 250 MG tablet Zpack taper as directed ?Patient not taking: Reported on 10/05/2021 07/06/21   Martyn Ehrich, NP  ?azithromycin (ZITHROMAX) 250 MG tablet Take first 2 tablets together, then 1 every day until finished. 10/05/21   Volney American, PA-C  ?blood glucose meter kit and supplies KIT Dispense based on patient and insurance preference. Use up to four times daily as directed. . For QAC - HS accuchecks. 05/31/21   Thurnell Lose, MD  ?carvedilol (COREG) 6.25 MG tablet Take 1 tablet (6.25 mg total) by mouth 2 (two) times daily with a meal. ?Patient not taking: Reported on 10/05/2021 05/29/21   Thurnell Lose, MD  ?clobetasol cream (TEMOVATE) 9.02 % Apply 1 application topically daily at 6 (six) AM. 12/23/20   [provider]  ?clonazePAM (KLONOPIN) 1 MG tablet Take 1 mg by mouth 2 (two) times daily as needed for anxiety. ?Patient not taking: Reported on 10/05/2021 08/24/20   [provider]  ?clopidogrel (PLAVIX) 75 MG tablet Take 1 tablet (75 mg total) by mouth daily. 06/01/17   Rosalin Hawking, MD   ?DULoxetine (CYMBALTA) 30 MG capsule Take 30 mg by mouth daily. 05/05/21   [provider]  ?fluticasone (FLONASE) 50 MCG/ACT nasal spray Place 1 spray into both nostrils daily. 07/06/21   Martyn Ehrich, NP  ?Fluticasone-Salmeterol (ADVAIR) 250-50 MCG/DOSE AEPB Inhale 1 puff into the lungs 2 (two) times daily.    [provider]  ?furosemide (LASIX) 40 MG tablet Take 1 tablet (40 mg total) by mouth daily as needed for fluid or  edema (If your weight is above 2.5 pounds from your baseline weight, weigh yourself daily at the same time). ?Patient not taking: Reported on 10/05/2021 05/29/21   Thurnell Lose, MD  ?glipiZIDE (GLUCOTROL) 5 MG tablet Take 0.5 tablets (2.5 mg total) by mouth daily before breakfast. ?Patient not taking: Reported on 10/05/2021 05/31/21   Thurnell Lose, MD  ?isosorbide mononitrate (IMDUR) 30 MG 24 hr tablet TAKE 1 TABLET BY MOUTH  DAILY 08/02/21   [name], Wonda Cheng, MD  ?loratadine (CLARITIN) 10 MG tablet Take 10 mg by mouth daily. ?Patient not taking: Reported on 10/05/2021    [provider]  ?metFORMIN (GLUCOPHAGE) 500 MG tablet Take 1 tablet (500 mg total) by mouth 2 (two) times daily with a meal. ?Patient not taking: Reported on 10/05/2021 05/30/21   Thurnell Lose, MD  ?montelukast (SINGULAIR) 10 MG tablet Take 1 tablet (10 mg total) by mouth at bedtime. 05/29/21   Thurnell Lose, MD  ?nitroGLYCERIN (NITROSTAT) 0.4 MG SL tablet Place 1 tablet (0.4 mg total) under the tongue every 5 (five) minutes as needed for chest pain. 05/07/19   [name], Wonda Cheng, MD  ?predniSONE (DELTASONE) 10 MG tablet Take 2 tabs x 5 days ?Patient not taking: Reported on 10/05/2021 07/06/21   Martyn Ehrich, NP  ? ? ?Family History ?Family History  ?Problem Relation Age of Onset  ? Heart attack Mother   ? Heart disease Mother   ? Hypertension Mother   ? Asthma Mother   ? Heart attack Father   ? Heart disease Father   ? Hypertension Sister   ? Heart attack Sister   ? Cancer Sister  66  ?     colon  ? Hypertension Sister   ? Heart attack Sister   ? ? ?Social History ?Social History  ? ?Tobacco Use  ? Smoking status: Never  ? Smokeless tobacco: Never  ?Vaping Use  ? Vaping Use: Never used  ?Substance U

## 2022-01-18 NOTE — Discharge Instructions (Signed)
Use medications as prescribed. ?May apply cool compresses to the eyes to help with any burning or irritation. ?Warm compresses to the left ear for any pain or discomfort.  May take Tylenol as needed for pain. ?Do not stick anything inside the ear while your symptoms are present. ?Follow-up in our clinic or with your primary care physician if symptoms do not improve. ?

## 2022-01-18 NOTE — ED Triage Notes (Signed)
Watery eyes that started yesterday with drainage. States eyes itch. Left ear pain. ?

## 2022-01-19 ENCOUNTER — Ambulatory Visit: Payer: Medicare Other | Admitting: Physician Assistant

## 2022-01-28 ENCOUNTER — Encounter: Payer: Self-pay | Admitting: *Deleted

## 2022-02-01 ENCOUNTER — Telehealth: Payer: Self-pay | Admitting: *Deleted

## 2022-02-01 ENCOUNTER — Encounter: Payer: Self-pay | Admitting: Internal Medicine

## 2022-02-01 ENCOUNTER — Ambulatory Visit (INDEPENDENT_AMBULATORY_CARE_PROVIDER_SITE_OTHER): Payer: Medicare Other | Admitting: Internal Medicine

## 2022-02-01 VITALS — BP 160/84 | HR 72 | Ht 64.0 in | Wt 154.0 lb

## 2022-02-01 DIAGNOSIS — K5900 Constipation, unspecified: Secondary | ICD-10-CM | POA: Diagnosis not present

## 2022-02-01 DIAGNOSIS — K6289 Other specified diseases of anus and rectum: Secondary | ICD-10-CM | POA: Diagnosis not present

## 2022-02-01 DIAGNOSIS — R1013 Epigastric pain: Secondary | ICD-10-CM | POA: Diagnosis not present

## 2022-02-01 DIAGNOSIS — R140 Abdominal distension (gaseous): Secondary | ICD-10-CM | POA: Diagnosis not present

## 2022-02-01 DIAGNOSIS — Z800 Family history of malignant neoplasm of digestive organs: Secondary | ICD-10-CM | POA: Diagnosis not present

## 2022-02-01 DIAGNOSIS — R110 Nausea: Secondary | ICD-10-CM | POA: Diagnosis not present

## 2022-02-01 MED ORDER — ONDANSETRON HCL 4 MG PO TABS: 4.0000 mg | ORAL_TABLET | Freq: Three times a day (TID) | ORAL | 0 refills | Status: DC | PRN

## 2022-02-01 NOTE — Progress Notes (Signed)
Chief Complaint: Constipation, nausea  HPI : 81 year old female with history of asthma, breast cancer s/p mastectomy, DDD, CVA, PUD presents with constipation and nausea  About 2 months ago she starting developing bad constipation. She could not get her bowels to move. She had to go to urgent care. She had a manual disimpaction at the ED twice. Then they put her on Metamucil as an outpatient. However, she still has some constipation. Sometimes she can eat, be 3 miles from her house, and still not be able to make to the bathroom before having an accident. On average she has 1 BM once every other day. Denies rectal bleeding. Last colonoscopy was 20 years ago and was normal. Endorses nausea. Denies vomiting. When she touches the epigastric area, she will feel nauseous and uncomfortable. Denies overt abdominal pain. She does take pantoprazole 40 mg QD and has for many years. Denies issues with acid reflux. She will take Pepto Bismol as needed to help with nausea. Endorses bloating. Sister had colon cancer about 30 years in her 73s. Endorses use of Plavix. Denies weight loss or rectal pain. Denies dypsphagia  Wt Readings from Last 3 Encounters:  02/01/22 154 lb (69.9 kg)  12/23/21 156 lb (70.8 kg)  07/06/21 154 lb 3.2 oz (69.9 kg)   Past Medical History:  Diagnosis Date   Aortic atherosclerosis ([place])    Asthma    Breast CA ([place])    Cancer ([place])    right breast mastectomy   DDD (degenerative disc disease), cervical    Duodenal ulcer    External hemorrhoids    Generalized anxiety disorder    H/O chest pain 03/24/2006   normal nuclear  study   Headache    High cholesterol    Hypertension    Mild aortic insufficiency 03/28/2006   echo   Normal cardiac stress test 2005   Pancreatitis    Personal history of chemotherapy    Personal history of radiation therapy    PONV (postoperative nausea and vomiting)    Sinus bradycardia on ECG    Stroke [facility])      Past Surgical History:   Procedure Laterality Date   ABDOMINAL HYSTERECTOMY     APPENDECTOMY     "30 years ago"   childbirth     x 2   CHOLECYSTECTOMY     "15 years ago"   ESOPHAGOGASTRODUODENOSCOPY N/A 03/04/2014   Procedure: ESOPHAGOGASTRODUODENOSCOPY (EGD);  Surgeon: [name], MD;  Location: [facility] ENDOSCOPY;  Service: Endoscopy;  Laterality: N/A;   MASTECTOMY  1993   right breast   RIGHT/LEFT HEART CATH AND CORONARY ANGIOGRAPHY N/A 05/30/2019   Procedure: RIGHT/LEFT HEART CATH AND CORONARY ANGIOGRAPHY;  Surgeon: [name], MD;  Location: [place] CV LAB;  Service: Cardiovascular;  Laterality: N/A;   TUBAL LIGATION     Family History  Problem Relation Age of Onset   Heart attack Mother    Heart disease Mother    Hypertension Mother    Asthma Mother    Heart attack Father    Heart disease Father    Hypertension Sister    Heart attack Sister    Colon cancer Sister 63   Hypertension Sister    Heart attack Sister    Heart attack Sister    Lung disease Daughter    Thyroid disease Daughter    Social History   Tobacco Use   Smoking status: Never   Smokeless tobacco: Never  Vaping Use   Vaping Use:  Never used  Substance Use Topics   Alcohol use: No    Alcohol/week: 0.0 standard drinks   Drug use: No   Current Outpatient Medications  Medication Sig Dispense Refill   albuterol (VENTOLIN HFA) 108 (90 Base) MCG/ACT inhaler Inhale 1-2 puffs into the lungs every 4 (four) hours as needed for wheezing or shortness of breath. 6.7 g 0   amLODipine (NORVASC) 5 MG tablet Take 5 mg by mouth daily.     atorvastatin (LIPITOR) 80 MG tablet Take 1 tablet (80 mg total) by mouth daily at 6 PM. 30 tablet 3   azelastine (ASTELIN) 0.1 % nasal spray Place 1 spray into both nostrils 2 (two) times daily. Use in each nostril as directed 30 mL 3   azithromycin (ZITHROMAX) 250 MG tablet Take first 2 tablets together, then 1 every day until finished. 6 tablet 0   blood glucose meter kit and supplies KIT  Dispense based on patient and insurance preference. Use up to four times daily as directed. . For QAC - HS accuchecks. 1 each 0   clobetasol cream (TEMOVATE) 3.22 % Apply 1 application topically daily at 6 (six) AM.     clonazePAM (KLONOPIN) 1 MG tablet Take 1 mg by mouth 2 (two) times daily as needed for anxiety.     clopidogrel (PLAVIX) 75 MG tablet Take 1 tablet (75 mg total) by mouth daily. 90 tablet 2   DULoxetine (CYMBALTA) 30 MG capsule Take 30 mg by mouth daily.     fluticasone (FLONASE) 50 MCG/ACT nasal spray Place 1 spray into both nostrils daily. 16 g 3   Fluticasone-Salmeterol (ADVAIR) 250-50 MCG/DOSE AEPB Inhale 1 puff into the lungs 2 (two) times daily.     isosorbide mononitrate (IMDUR) 30 MG 24 hr tablet TAKE 1 TABLET BY MOUTH  DAILY 90 tablet 3   loratadine (CLARITIN) 10 MG tablet Take 10 mg by mouth daily.     montelukast (SINGULAIR) 10 MG tablet Take 1 tablet (10 mg total) by mouth at bedtime. 30 tablet 0   nortriptyline (PAMELOR) 10 MG capsule Take 2 capsules by mouth daily.     ondansetron (ZOFRAN) 4 MG tablet Take 1 tablet (4 mg total) by mouth every 8 (eight) hours as needed for nausea or vomiting. 30 tablet 0   pantoprazole (PROTONIX) 40 MG tablet Take 40 mg by mouth daily.     nitroGLYCERIN (NITROSTAT) 0.4 MG SL tablet Place 1 tablet (0.4 mg total) under the tongue every 5 (five) minutes as needed for chest pain. (Patient not taking: Reported on 02/01/2022) 25 tablet 6   Current Facility-Administered Medications  Medication Dose Route Frequency Provider Last Rate Last Admin   triamcinolone acetonide (KENALOG) 10 MG/ML injection 10 mg  10 mg Other Once Bronson Ing, DPM       Allergies  Allergen Reactions   Ciprofloxacin     Other reaction(s): Other (See Comments)   Codeine Nausea And Vomiting   Doxycycline Nausea And Vomiting    REACTION: GI upset   Percocet [Oxycodone-Acetaminophen] Nausea And Vomiting   Tramadol     Other reaction(s): Other (See Comments)      Review of Systems: All systems reviewed and negative except where noted in HPI.   Physical Exam: BP (!) 160/84   Pulse 72   Ht '5\' 4"'  (1.626 m)   Wt 154 lb (69.9 kg)   BMI 26.43 kg/m  Constitutional: Pleasant,well-developed, female in no acute distress. HEENT: Normocephalic and atraumatic. Conjunctivae are normal. No scleral  icterus. Cardiovascular: Normal rate, regular rhythm.  Pulmonary/chest: Effort normal and breath sounds normal. No wheezing, rales or rhonchi. Abdominal: Soft, nondistended, nontender. Bowel sounds active throughout. There are no masses palpable. No hepatomegaly. Extremities: No edema Neurological: Alert and oriented to person place and time. Skin: Skin is warm and dry. No rashes noted. Psychiatric: Normal mood and affect. Behavior is normal.  Labs 12/23/21: CBC nml. CMP unremarkable.  CT A/P w/contrast 12/23/21: IMPRESSION: 1. Findings above consistent with infectious or inflammatory proctitis. 2.  Aortic Atherosclerosis (ICD10-I70.0).  EGD 03/04/14: IMPRESSION: 1) Duodenal ulcers - nonbleeding. 2) Gastritis.  ASSESSMENT AND PLAN: Proctitis Constipation Fecal incontinence Nausea Epigastric discomfort Bloating Family history of colon cancer Patient presents with constipation that has worsened over the last 2 months.  She has been experiencing some fecal incontinence and nausea along with her symptoms.  Has had to have 2 manual disimpactions in the ED recently.  A CT last month did show that she has some signs of proctitis.  At this time I do suspect that the patient may be experiencing overflow incontinence.  Proctitis may be due to stercoral colitis.  However interestingly on her last CT she did not have significant amounts of stool burden seen based upon my evaluation.  I will go ahead and institute some conservative therapies to try and help with her constipation.  Will start the patient on some Zofran to help with nausea. Will also plan for EGD  and flexible sigmoidoscopy for further evaluation.  Patient was not interested in a full colonoscopy because she has had friends with significant complications due to their colonoscopy procedures.  She does have strong family history of colon cancer with her sister being diagnosed at a young age. - Continue Metamucil - Drink 8 cups of water per day, walk 30 min per day, daily fiber supplement - Miralax QD - Zofran PRN - EGD/flexible sigmoidoscopy [facility]. Patient is not interested in a colonoscopy. Will need Plavix held 5 days beforehand.  Christia Reading, MD  I spent 61 minutes of time, including in depth chart review, independent review of results as outlined above, communicating results with the patient directly, face-to-face time with the patient, coordinating care, ordering studies and medications as appropriate, and documentation.

## 2022-02-01 NOTE — Telephone Encounter (Signed)
Anticoagulation clearance letter sent to Dr Collene Leyden both electronically and by manual fax. Will await response.

## 2022-02-01 NOTE — Patient Instructions (Addendum)
You have been scheduled for an endoscopy/flexible sigmoidoscopy. Please follow written instructions given to you at your visit today. If you use inhalers (even only as needed), please bring them with you on the day of your procedure. _______________________________________________  Drink 8 cups of water a day and walk 30 minutes a day.  Please purchase the following medications over the counter and take as directed: Fiber supplement such as Benefiber- use as directed daily Miralax: Take as directed up to 3 times a day to achieve regular bowel movements ________________________________________________  We have sent the following medications to your pharmacy for you to pick up at your convenience: Zofran _________________________________________________  If you are age 82 or older, your body mass index should be between 23-30. Your Body mass index is 26.43 kg/m. If this is out of the aforementioned range listed, please consider follow up with your Primary Care Provider.  If you are age 82 or younger, your body mass index should be between 19-25. Your Body mass index is 26.43 kg/m. If this is out of the aformentioned range listed, please consider follow up with your Primary Care Provider.   ________________________________________________________  The  GI providers would like to encourage you to use [facility] to communicate with providers for non-urgent requests or questions.  Due to long hold times on the telephone, sending your provider a message by [facility] may be a faster and more efficient way to get a response.  Please allow 48 business hours for a response.  Please remember that this is for non-urgent requests.  _____________________________________________________  Due to recent changes in healthcare laws, you may see the results of your imaging and laboratory studies on MyChart before your provider has had a chance to review them.  We understand that in some cases there may be  results that are confusing or concerning to you. Not all laboratory results come back in the same time frame and the provider may be waiting for multiple results in order to interpret others.  Please give [place] 48 hours in order for your provider to thoroughly review all the results before contacting the office for clarification of your results.

## 2022-02-09 ENCOUNTER — Encounter: Payer: Medicare Other | Admitting: Internal Medicine

## 2022-02-10 NOTE — Telephone Encounter (Signed)
I have contacted Dr Kirby Funk office regarding anticoagulation hold. They indicate that Dr Esmeralda Links has been out of the office but should return tomorrow. I have refaxed the anticoagulation clearance request to fax number [phone].

## 2022-02-11 NOTE — Telephone Encounter (Signed)
I have spoken to patient to advise that per Dr Esmeralda Links, she may hold her plavix 5 days prior to her upcoming procedure. Patient verbalizes understanding of this information.

## 2022-02-11 NOTE — Telephone Encounter (Signed)
I have received verbal authorization from Dr Herma Mering' office as well as a written fax with the following information:  "Our mutual patient, [name] 09/15/1940, has been approved to stop Plavix 5 days prior to procedure. Please accept this letter for clearance. Please let us know if there are any contraindications of any recommendation for this patient during the procedure, or in the post-operative period. Collene Leyden, MD 02/11/22"  I have attempted to reach patient to advise but got no answer. Left voicemail for patient to return my call.  *see original fax from Dr Guido Sander under "media" tab in [place].

## 2022-02-21 ENCOUNTER — Telehealth: Payer: Self-pay | Admitting: Internal Medicine

## 2022-02-21 NOTE — Telephone Encounter (Signed)
Returned call to patient. Advised her that this is the standard prep for the Flex -Sig as Mag Citrate is not available at this time. Pt voiced understanding and will proceed with instructions given to her at office visit.

## 2022-02-21 NOTE — Telephone Encounter (Signed)
Inbound call from patient requesting a call back to discuss if there is anyway she could switch her prep medication for her procedure on 6/15. Please advise.

## 2022-02-22 NOTE — Telephone Encounter (Signed)
Inbound call from patient stating she is still having trouble understanding prep for upcoming procedure on 02/24/22. Please give patient a call back to advise.  Thank you

## 2022-02-23 NOTE — Telephone Encounter (Signed)
Returned call to patient again this morning. Went over her instructions. Pt understood that she is to start clear liquids this afternoon. Pt read back to me the instructions that were given to her in office and voiced that she now understands.

## 2022-02-24 ENCOUNTER — Ambulatory Visit (INDEPENDENT_AMBULATORY_CARE_PROVIDER_SITE_OTHER): Payer: Medicare Other | Admitting: Gastroenterology

## 2022-02-24 ENCOUNTER — Encounter: Payer: Self-pay | Admitting: Internal Medicine

## 2022-02-24 ENCOUNTER — Ambulatory Visit (AMBULATORY_SURGERY_CENTER): Payer: Medicare Other | Admitting: Internal Medicine

## 2022-02-24 VITALS — BP 193/88 | HR 60 | Temp 97.3°F | Resp 15 | Ht 64.0 in | Wt 154.0 lb

## 2022-02-24 DIAGNOSIS — I10 Essential (primary) hypertension: Secondary | ICD-10-CM | POA: Diagnosis not present

## 2022-02-24 DIAGNOSIS — D124 Benign neoplasm of descending colon: Secondary | ICD-10-CM | POA: Diagnosis not present

## 2022-02-24 DIAGNOSIS — K648 Other hemorrhoids: Secondary | ICD-10-CM

## 2022-02-24 DIAGNOSIS — R159 Full incontinence of feces: Secondary | ICD-10-CM | POA: Diagnosis not present

## 2022-02-24 DIAGNOSIS — D123 Benign neoplasm of transverse colon: Secondary | ICD-10-CM | POA: Diagnosis not present

## 2022-02-24 DIAGNOSIS — E785 Hyperlipidemia, unspecified: Secondary | ICD-10-CM | POA: Diagnosis not present

## 2022-02-24 DIAGNOSIS — R1013 Epigastric pain: Secondary | ICD-10-CM

## 2022-02-24 DIAGNOSIS — D125 Benign neoplasm of sigmoid colon: Secondary | ICD-10-CM | POA: Diagnosis not present

## 2022-02-24 DIAGNOSIS — R194 Change in bowel habit: Secondary | ICD-10-CM

## 2022-02-24 DIAGNOSIS — K317 Polyp of stomach and duodenum: Secondary | ICD-10-CM | POA: Diagnosis not present

## 2022-02-24 DIAGNOSIS — K5900 Constipation, unspecified: Secondary | ICD-10-CM

## 2022-02-24 DIAGNOSIS — K2970 Gastritis, unspecified, without bleeding: Secondary | ICD-10-CM

## 2022-02-24 DIAGNOSIS — K635 Polyp of colon: Secondary | ICD-10-CM | POA: Diagnosis not present

## 2022-02-24 DIAGNOSIS — K2980 Duodenitis without bleeding: Secondary | ICD-10-CM | POA: Diagnosis not present

## 2022-02-24 MED ORDER — PANTOPRAZOLE SODIUM 40 MG PO TBEC
40.0000 mg | DELAYED_RELEASE_TABLET | Freq: Two times a day (BID) | ORAL | 0 refills | Status: DC
Start: 2022-02-24 — End: 2022-04-01

## 2022-02-24 MED ORDER — SODIUM CHLORIDE 0.9 % IV SOLN
500.0000 mL | Freq: Once | INTRAVENOUS | Status: DC
Start: 2022-02-24 — End: 2022-02-24

## 2022-02-24 NOTE — Op Note (Addendum)
[facility] Patient Name: Lorene Klimas Procedure Date: 02/24/2022 1:34 PM MRN: [id number] Endoscopist: Sonny Masters "Christia Reading ,  Age: 81 Referring MD:  Date of Birth: 02-17-1941 Gender: Female Account #: [id number] Procedure:                Upper GI endoscopy Indications:              Epigastric abdominal pain Medicines:                Monitored Anesthesia Care Procedure:                Pre-Anesthesia Assessment:                           - Prior to the procedure, a History and Physical                            was performed, and patient medications and                            allergies were reviewed. The patient's tolerance of                            previous anesthesia was also reviewed. The risks                            and benefits of the procedure and the sedation                            options and risks were discussed with the patient.                            All questions were answered, and informed consent                            was obtained. Prior Anticoagulants: The patient has                            taken Plavix (clopidogrel), last dose was 6 days                            prior to procedure. ASA Grade Assessment: III - A                            patient with severe systemic disease. After                            reviewing the risks and benefits, the patient was                            deemed in satisfactory condition to undergo the                            procedure.  After obtaining informed consent, the endoscope was                            passed under direct vision. Throughout the                            procedure, the patient's blood pressure, pulse, and                            oxygen saturations were monitored continuously. The                            Endoscope was introduced through the mouth, and                            advanced to the second part of duodenum. The upper                             GI endoscopy was accomplished without difficulty.                            The patient tolerated the procedure well. Scope In: Scope Out: Findings:                 White nummular lesions were noted in the proximal                            esophagus. Biopsies were taken with a cold forceps                            for histology.                           Multiple sessile polyps with no bleeding and no                            stigmata of recent bleeding were found in the                            gastric fundus and in the gastric body. Biopsies                            were taken with a cold forceps for histology.                           Localized inflammation characterized by congestion                            (edema) and erythema was found in the gastric                            antrum. Biopsies were taken with a cold forceps for  histology.                           Localized mild inflammation characterized by                            congestion (edema) and erythema was found in the                            duodenal bulb. Biopsies were taken with a cold                            forceps for histology. Complications:            No immediate complications. Estimated Blood Loss:     Estimated blood loss was minimal. Impression:               - White nummular lesions in esophageal mucosa.                            Biopsied.                           - Multiple gastric polyps. Biopsied.                           - Gastritis. Biopsied.                           - Duodenitis. Biopsied. Recommendation:           - Use Protonix (pantoprazole) 40 mg PO BID for 8                            weeks, then decrease to QD.                           - Await pathology results.                           - Perform a flexible sigmoidoscopy today. [address]ia Reading,  02/24/2022 2:26:57 PM

## 2022-02-24 NOTE — Progress Notes (Signed)
GASTROENTEROLOGY PROCEDURE H&P NOTE   Primary Care Physician: Collene Leyden, MD    Reason for Procedure:   Proctitis on imaging, change in bowel habits, fecal incontinence, epigastric discomfort  Plan:    EGD/flexible sigmoidoscopy  Patient is appropriate for endoscopic procedure(s) in the ambulatory ([facility]) setting.  The nature of the procedure, as well as the risks, benefits, and alternatives were carefully and thoroughly reviewed with the patient. Ample time for discussion and questions allowed. The patient understood, was satisfied, and agreed to proceed.     HPI: [name] is a 81 y.o. female who presents for EGD/flexible sigmoidoscopy for evaluation of proctitis, change in bowel habits, fecal incontinence, epigastric discomfort .  Patient was most recently seen in the Gastroenterology Clinic on 02/01/22.  No interval change in medical history since that appointment. Please refer to that note for full details regarding GI history and clinical presentation.   Past Medical History:  Diagnosis Date   Aortic atherosclerosis ([place])    Asthma    Breast CA ([place])    Cancer ([place])    right breast mastectomy   DDD (degenerative disc disease), cervical    Duodenal ulcer    External hemorrhoids    Generalized anxiety disorder    H/O chest pain 03/24/2006   normal nuclear  study   Headache    High cholesterol    Hypertension    Mild aortic insufficiency 03/28/2006   echo   Normal cardiac stress test 2005   Pancreatitis    Personal history of chemotherapy    Personal history of radiation therapy    PONV (postoperative nausea and vomiting)    Sinus bradycardia on ECG    Stroke [facility])     Past Surgical History:  Procedure Laterality Date   ABDOMINAL HYSTERECTOMY     APPENDECTOMY     "30 years ago"   childbirth     x 2   CHOLECYSTECTOMY     "15 years ago"   ESOPHAGOGASTRODUODENOSCOPY N/A 03/04/2014   Procedure: ESOPHAGOGASTRODUODENOSCOPY (EGD);  Surgeon: [name],  MD;  Location: [facility] ENDOSCOPY;  Service: Endoscopy;  Laterality: N/A;   MASTECTOMY  1993   right breast   RIGHT/LEFT HEART CATH AND CORONARY ANGIOGRAPHY N/A 05/30/2019   Procedure: RIGHT/LEFT HEART CATH AND CORONARY ANGIOGRAPHY;  Surgeon: [name], MD;  Location: [facility] CV LAB;  Service: Cardiovascular;  Laterality: N/A;   TUBAL LIGATION      Prior to Admission medications   Medication Sig Start Date End Date Taking? Authorizing Provider  albuterol (VENTOLIN HFA) 108 (90 Base) MCG/ACT inhaler Inhale 1-2 puffs into the lungs every 4 (four) hours as needed for wheezing or shortness of breath. 05/29/21   Thurnell Lose, MD  amLODipine (NORVASC) 5 MG tablet Take 5 mg by mouth daily.    [provider]  atorvastatin (LIPITOR) 80 MG tablet Take 1 tablet (80 mg total) by mouth daily at 6 PM. 07/06/15   Rosalin Hawking, MD  azelastine (ASTELIN) 0.1 % nasal spray Place 1 spray into both nostrils 2 (two) times daily. Use in each nostril as directed 07/06/21   Martyn Ehrich, NP  azithromycin (ZITHROMAX) 250 MG tablet Take first 2 tablets together, then 1 every day until finished. 10/05/21   Volney American, PA-C  blood glucose meter kit and supplies KIT Dispense based on patient and insurance preference. Use up to four times daily as directed. . For QAC - HS accuchecks. 05/31/21   Lala Lund  K, MD  clobetasol cream (TEMOVATE) 8.42 % Apply 1 application topically daily at 6 (six) AM. 12/23/20   [provider]  clonazePAM (KLONOPIN) 1 MG tablet Take 1 mg by mouth 2 (two) times daily as needed for anxiety. 08/24/20   [provider]  clopidogrel (PLAVIX) 75 MG tablet Take 1 tablet (75 mg total) by mouth daily. 06/01/17   Rosalin Hawking, MD  DULoxetine (CYMBALTA) 30 MG capsule Take 30 mg by mouth daily. 05/05/21   [provider]  fluticasone (FLONASE) 50 MCG/ACT nasal spray Place 1 spray into both nostrils daily. 07/06/21   Martyn Ehrich, NP   Fluticasone-Salmeterol (ADVAIR) 250-50 MCG/DOSE AEPB Inhale 1 puff into the lungs 2 (two) times daily.    [provider]  isosorbide mononitrate (IMDUR) 30 MG 24 hr tablet TAKE 1 TABLET BY MOUTH  DAILY 08/02/21   [name], Wonda Cheng, MD  loratadine (CLARITIN) 10 MG tablet Take 10 mg by mouth daily.    [provider]  montelukast (SINGULAIR) 10 MG tablet Take 1 tablet (10 mg total) by mouth at bedtime. 05/29/21   Thurnell Lose, MD  nitroGLYCERIN (NITROSTAT) 0.4 MG SL tablet Place 1 tablet (0.4 mg total) under the tongue every 5 (five) minutes as needed for chest pain. Patient not taking: Reported on 02/01/2022 05/07/19   [name], Wonda Cheng, MD  nortriptyline (PAMELOR) 10 MG capsule Take 2 capsules by mouth daily. 06/27/17   [provider]  ondansetron (ZOFRAN) 4 MG tablet Take 1 tablet (4 mg total) by mouth every 8 (eight) hours as needed for nausea or vomiting. 02/01/22   Sharyn Creamer, MD  pantoprazole (PROTONIX) 40 MG tablet Take 40 mg by mouth daily. 09/20/21   [provider]    Current Outpatient Medications  Medication Sig Dispense Refill   albuterol (VENTOLIN HFA) 108 (90 Base) MCG/ACT inhaler Inhale 1-2 puffs into the lungs every 4 (four) hours as needed for wheezing or shortness of breath. 6.7 g 0   amLODipine (NORVASC) 5 MG tablet Take 5 mg by mouth daily.     atorvastatin (LIPITOR) 80 MG tablet Take 1 tablet (80 mg total) by mouth daily at 6 PM. 30 tablet 3   azelastine (ASTELIN) 0.1 % nasal spray Place 1 spray into both nostrils 2 (two) times daily. Use in each nostril as directed 30 mL 3   azithromycin (ZITHROMAX) 250 MG tablet Take first 2 tablets together, then 1 every day until finished. 6 tablet 0   blood glucose meter kit and supplies KIT Dispense based on patient and insurance preference. Use up to four times daily as directed. . For QAC - HS accuchecks. 1 each 0   clobetasol cream (TEMOVATE) 1.03 % Apply 1 application topically daily at 6  (six) AM.     clonazePAM (KLONOPIN) 1 MG tablet Take 1 mg by mouth 2 (two) times daily as needed for anxiety.     clopidogrel (PLAVIX) 75 MG tablet Take 1 tablet (75 mg total) by mouth daily. 90 tablet 2   DULoxetine (CYMBALTA) 30 MG capsule Take 30 mg by mouth daily.     fluticasone (FLONASE) 50 MCG/ACT nasal spray Place 1 spray into both nostrils daily. 16 g 3   Fluticasone-Salmeterol (ADVAIR) 250-50 MCG/DOSE AEPB Inhale 1 puff into the lungs 2 (two) times daily.     isosorbide mononitrate (IMDUR) 30 MG 24 hr tablet TAKE 1 TABLET BY MOUTH  DAILY 90 tablet 3   loratadine (CLARITIN) 10 MG tablet Take  10 mg by mouth daily.     montelukast (SINGULAIR) 10 MG tablet Take 1 tablet (10 mg total) by mouth at bedtime. 30 tablet 0   nitroGLYCERIN (NITROSTAT) 0.4 MG SL tablet Place 1 tablet (0.4 mg total) under the tongue every 5 (five) minutes as needed for chest pain. (Patient not taking: Reported on 02/01/2022) 25 tablet 6   nortriptyline (PAMELOR) 10 MG capsule Take 2 capsules by mouth daily.     ondansetron (ZOFRAN) 4 MG tablet Take 1 tablet (4 mg total) by mouth every 8 (eight) hours as needed for nausea or vomiting. 30 tablet 0   pantoprazole (PROTONIX) 40 MG tablet Take 40 mg by mouth daily.     Current Facility-Administered Medications  Medication Dose Route Frequency Provider Last Rate Last Admin   triamcinolone acetonide (KENALOG) 10 MG/ML injection 10 mg  10 mg Other Once Bronson Ing, DPM        Allergies as of 02/24/2022 - Review Complete 02/24/2022  Allergen Reaction Noted   Ciprofloxacin  07/13/2021   Codeine Nausea And Vomiting 03/24/2014   Doxycycline Nausea And Vomiting 11/28/2007   Percocet [oxycodone-acetaminophen] Nausea And Vomiting 03/02/2014   Tramadol  03/24/2014    Family History  Problem Relation Age of Onset   Heart attack Mother    Heart disease Mother    Hypertension Mother    Asthma Mother    Heart attack Father    Heart disease Father    Hypertension  Sister    Heart attack Sister    Colon cancer Sister 47   Hypertension Sister    Heart attack Sister    Heart attack Sister    Lung disease Daughter    Thyroid disease Daughter     Social History   Socioeconomic History   Marital status: Married    Spouse name: Tommy    Number of children: 2   Years of education: 12+   Highest education level: Not on file  Occupational History   Occupation: retired  Tobacco Use   Smoking status: Never   Smokeless tobacco: Never  Vaping Use   Vaping Use: Never used  Substance and Sexual Activity   Alcohol use: No    Alcohol/week: 0.0 standard drinks of alcohol   Drug use: No   Sexual activity: Not Currently  Other Topics Concern   Not on file  Social History Narrative   Right handed, caffeine 2 cups daily, married, 4 kids, Retired.  [employer].    Social Determinants of Health   Financial Resource Strain: Not on file  Food Insecurity: Not on file  Transportation Needs: Not on file  Physical Activity: Not on file  Stress: Not on file  Social Connections: Not on file  Intimate Partner Violence: Not on file    Physical Exam: Vital signs in last 24 hours: BP (!) 191/87   Pulse 67   Temp (!) 97.3 F (36.3 C) (Skin)   Ht '5\' 4"'  (1.626 m)   Wt 154 lb (69.9 kg)   SpO2 96%   BMI 26.43 kg/m  GEN: NAD EYE: Sclerae anicteric ENT: MMM CV: Non-tachycardic Pulm: No increased WOB GI: Soft NEURO:  Alert & Oriented   Christia Reading, MD [facility]   02/24/2022 12:53 PM

## 2022-02-24 NOTE — Op Note (Addendum)
[name]: Dannisha Eckmann Procedure Date: 02/24/2022 1:34 PM MRN: [id number] Endoscopist: Sonny Masters "[name] ,  Age: 81 Referring MD:  Date of Birth: [birth year] Gender: Female Account #: [id number] Procedure:                Flexible Sigmoidoscopy Indications:              Constipation, Incontinence of feces, Change in                            bowel habits Medicines:                Monitored Anesthesia Care Procedure:                Pre-Anesthesia Assessment:                           - Prior to the procedure, a History and Physical                            was performed, and patient medications and                            allergies were reviewed. The patient's tolerance of                            previous anesthesia was also reviewed. The risks                            and benefits of the procedure and the sedation                            options and risks were discussed with the patient.                            All questions were answered, and informed consent                            was obtained. Prior Anticoagulants: The patient has                            taken Plavix (clopidogrel), last dose was 6 days                            prior to procedure. ASA Grade Assessment: III - A                            patient with severe systemic disease. After                            reviewing the risks and benefits, the patient was                            deemed in satisfactory condition to undergo the  procedure.                           After obtaining informed consent, the scope was                            passed under direct vision. The Endoscope was                            introduced through the anus and advanced to the the                            left transverse colon. The flexible sigmoidoscopy                            was accomplished without difficulty. The patient                             tolerated the procedure well. The quality of the                            bowel preparation was good. Scope In: 1:52:14 PM Scope Out: 2:15:22 PM Total Procedure Duration: 0 hours 23 minutes 8 seconds  Findings:                 Five sessile polyps were found in the sigmoid                            colon, descending colon and transverse colon. The                            polyps were 3 to 6 mm in size. These polyps were                            removed with a cold snare. Resection and retrieval                            were complete.                           A 11 mm polyp was found in the sigmoid colon. The                            polyp was sessile. The polyp was removed with a                            cold snare. Resection and retrieval were complete.                           Non-bleeding internal hemorrhoids were found during                            retroflexion. Complications:            No immediate complications. Estimated Blood  Loss:     Estimated blood loss was minimal. Impression:               - Five 3 to 6 mm polyps in the sigmoid colon, in                            the descending colon and in the transverse colon,                            removed with a cold snare. Resected and retrieved.                           - One 11 mm polyp in the sigmoid colon, removed                            with a cold snare. Resected and retrieved.                           - Non-bleeding internal hemorrhoids. Recommendation:           - Discharge patient to home (with escort).                           - Await pathology results.                           - Okay to restart Plavix tomorrow.                           - Return to GI clinic in 4 weeks.                           - The findings and recommendations were discussed                            with the patient. Sonny Masters "[name],  02/24/2022 2:31:17 PM

## 2022-02-24 NOTE — Progress Notes (Signed)
To pacu, VSS. Report to [email or web address] 

## 2022-02-24 NOTE — Progress Notes (Signed)
Pt's states no medical or surgical changes since previsit or office visit.   Vs by CW in adm 

## 2022-02-24 NOTE — Progress Notes (Signed)
Called to room to assist during endoscopic procedure.  Patient ID and intended procedure confirmed with present staff. Received instructions for my participation in the procedure from the performing physician.  

## 2022-02-24 NOTE — Patient Instructions (Addendum)
Handouts on polyps given. Await pathology results. Resume Plavix tomorrow.  Pick up prescription for Protonix 40 mg twice a day for 8 weeks then once a day after that. Follow up appointment in GI clinic in 4 weeks.    YOU HAD AN ENDOSCOPIC PROCEDURE TODAY AT [facility]:   Refer to the procedure report that was given to you for any specific questions about what was found during the examination.  If the procedure report does not answer your questions, please call your gastroenterologist to clarify.  If you requested that your care partner not be given the details of your procedure findings, then the procedure report has been included in a sealed envelope for you to review at your convenience later.  YOU SHOULD EXPECT: Some feelings of bloating in the abdomen. Passage of more gas than usual.  Walking can help get rid of the air that was put into your GI tract during the procedure and reduce the bloating. If you had a lower endoscopy (such as a colonoscopy or flexible sigmoidoscopy) you may notice spotting of blood in your stool or on the toilet paper. If you underwent a bowel prep for your procedure, you may not have a normal bowel movement for a few days.  Please Note:  You might notice some irritation and congestion in your nose or some drainage.  This is from the oxygen used during your procedure.  There is no need for concern and it should clear up in a day or so.  SYMPTOMS TO REPORT IMMEDIATELY:  Following lower endoscopy (colonoscopy or flexible sigmoidoscopy):  Excessive amounts of blood in the stool  Significant tenderness or worsening of abdominal pains  Swelling of the abdomen that is new, acute  Fever of 100F or higher  Following upper endoscopy (EGD)  Vomiting of blood or coffee ground material  New chest pain or pain under the shoulder blades  Painful or persistently difficult swallowing  New shortness of breath  Fever of 100F or higher  Black, tarry-looking  stools  For urgent or emergent issues, a gastroenterologist can be reached at any hour by calling [phone]. Do not use MyChart messaging for urgent concerns.    DIET:  We do recommend a small meal at first, but then you may proceed to your regular diet.  Drink plenty of fluids but you should avoid alcoholic beverages for 24 hours.  ACTIVITY:  You should plan to take it easy for the rest of today and you should NOT DRIVE or use heavy machinery until tomorrow (because of the sedation medicines used during the test).    FOLLOW UP: Our staff will call the number listed on your records 24-72 hours following your procedure to check on you and address any questions or concerns that you may have regarding the information given to you following your procedure. If we do not reach you, we will leave a message.  We will attempt to reach you two times.  During this call, we will ask if you have developed any symptoms of COVID 19. If you develop any symptoms (ie: fever, flu-like symptoms, shortness of breath, cough etc.) before then, please call [phone].  If you test positive for Covid 19 in the 2 weeks post procedure, please call and report this information to [place].    If any biopsies were taken you will be contacted by phone or by letter within the next 1-3 weeks.  Please call us at [phone] if you have not heard  about the biopsies in 3 weeks.    SIGNATURES/CONFIDENTIALITY: You and/or your care partner have signed paperwork which will be entered into your electronic medical record.  These signatures attest to the fact that that the information above on your After Visit Summary has been reviewed and is understood.  Full responsibility of the confidentiality of this discharge information lies with you and/or your care-partner.

## 2022-02-25 ENCOUNTER — Telehealth: Payer: Self-pay

## 2022-02-25 NOTE — Telephone Encounter (Signed)
Attempted to reach patient for post-procedure f/u call. No answer. Left message that staff will make another attempt to reach her again later today and for her please not hesitate to call us if she has any questions/concerns regarding her care.

## 2022-02-25 NOTE — Telephone Encounter (Signed)
Second post procedure follow up call, no answer 

## 2022-03-01 ENCOUNTER — Other Ambulatory Visit: Payer: Self-pay

## 2022-03-02 ENCOUNTER — Encounter: Payer: Self-pay | Admitting: Internal Medicine

## 2022-03-21 DIAGNOSIS — M25822 Other specified joint disorders, left elbow: Secondary | ICD-10-CM | POA: Diagnosis not present

## 2022-03-21 DIAGNOSIS — S51012A Laceration without foreign body of left elbow, initial encounter: Secondary | ICD-10-CM | POA: Diagnosis not present

## 2022-03-21 DIAGNOSIS — S50312A Abrasion of left elbow, initial encounter: Secondary | ICD-10-CM | POA: Diagnosis not present

## 2022-03-24 DIAGNOSIS — L309 Dermatitis, unspecified: Secondary | ICD-10-CM | POA: Diagnosis not present

## 2022-03-24 DIAGNOSIS — S51012D Laceration without foreign body of left elbow, subsequent encounter: Secondary | ICD-10-CM | POA: Diagnosis not present

## 2022-03-24 DIAGNOSIS — S50312D Abrasion of left elbow, subsequent encounter: Secondary | ICD-10-CM | POA: Diagnosis not present

## 2022-03-29 DIAGNOSIS — M1812 Unilateral primary osteoarthritis of first carpometacarpal joint, left hand: Secondary | ICD-10-CM | POA: Diagnosis not present

## 2022-03-30 ENCOUNTER — Ambulatory Visit (INDEPENDENT_AMBULATORY_CARE_PROVIDER_SITE_OTHER): Payer: Medicare Other | Admitting: Podiatry

## 2022-03-30 DIAGNOSIS — B351 Tinea unguium: Secondary | ICD-10-CM | POA: Diagnosis not present

## 2022-03-30 DIAGNOSIS — G629 Polyneuropathy, unspecified: Secondary | ICD-10-CM

## 2022-03-30 DIAGNOSIS — M109 Gout, unspecified: Secondary | ICD-10-CM

## 2022-03-30 NOTE — Patient Instructions (Signed)
Gout  Gout is painful swelling of your joints. Gout is a type of arthritis. It is caused by having too much uric acid in your body. Uric acid is a chemical that is made when your body breaks down substances called purines. If your body has too much uric acid, sharp crystals can form and build up in your joints. This causes pain and swelling. Gout attacks can happen quickly and be very painful (acute gout). Over time, the attacks can affect more joints and happen more often (chronic gout). What are the causes? Gout is caused by too much uric acid in your blood. This can happen because: Your kidneys do not remove enough uric acid from your blood. Your body makes too much uric acid. You eat too many foods that are high in purines. These foods include organ meats, some seafood, and beer. Trauma or stress can bring on an attack. What increases the risk? Having a family history of gout. Being female and middle-aged. Being female and having gone through menopause. Having an organ transplant. Taking certain medicines. Having certain conditions, such as: Being very overweight (obese). Lead poisoning. Kidney disease. A skin condition called psoriasis. Other risks include: Losing weight too quickly. Not having enough water in the body (being dehydrated). Drinking alcohol, especially beer. Drinking beverages that are sweetened with a type of sugar called fructose. What are the signs or symptoms? An attack of acute gout often starts at night and usually happens in just one joint. The most common place is the big toe. Other joints that may be affected include joints of the feet, ankle, knee, fingers, wrist, or elbow. Symptoms may include: Very bad pain. Warmth. Swelling. Stiffness. Tenderness. The affected joint may be very painful to touch. Shiny, red, or purple skin. Chills and fever. Chronic gout may cause symptoms more often. More joints may be involved. You may also have white or yellow lumps  (tophi) on your hands or feet or in other areas near your joints. How is this treated? Treatment for an acute attack may include medicines for pain and swelling, such as: NSAIDs, such as ibuprofen. Steroids taken by mouth or injected into a joint. Colchicine. This can be given by mouth or through an IV tube. Treatment to prevent future attacks may include: Taking small doses of NSAIDs or colchicine daily. Using a medicine that reduces uric acid levels in your blood, such as allopurinol. Making changes to your diet. You may need to see a food expert (dietitian) about what to eat and drink to prevent gout. Follow these instructions at home: During a gout attack  If told, put ice on the painful area. To do this: Put ice in a plastic bag. Place a towel between your skin and the bag. Leave the ice on for 20 minutes, 2-3 times a day. Take off the ice if your skin turns bright red. This is very important. If you cannot feel pain, heat, or cold, you have a greater risk of damage to the area. Raise the painful joint above the level of your heart as often as you can. Rest the joint as much as possible. If the joint is in your leg, you may be given crutches. Follow instructions from your doctor about what you cannot eat or drink. Avoiding future gout attacks Eat a low-purine diet. Avoid foods and drinks such as: Liver. Kidney. Anchovies. Asparagus. Herring. Mushrooms. Mussels. Beer. Stay at a healthy weight. If you want to lose weight, talk with your doctor. Do not   lose weight too fast. Start or continue an exercise plan as told by your doctor. Eating and drinking Avoid drinks sweetened by fructose. Drink enough fluids to keep your pee (urine) pale yellow. If you drink alcohol: Limit how much you have to: 0-1 drink a day for women who are not pregnant. 0-2 drinks a day for men. Know how much alcohol is in a drink. In the [place] one drink equals one 12 oz bottle of beer (355 mL), one 5 oz  glass of wine (148 mL), or one 1 oz glass of hard liquor (44 mL). General instructions Take over-the-counter and prescription medicines only as told by your doctor. Ask your doctor if you should avoid driving or using machines while you are taking your medicine. Return to your normal activities when your doctor says that it is safe. Keep all follow-up visits. Where to find more information National Institutes of Health: www.niams.nih.gov Contact a doctor if: You have another gout attack. You still have symptoms of a gout attack after 10 days of treatment. You have problems (side effects) because of your medicines. You have chills or a fever. You have burning pain when you pee (urinate). You have pain in your lower back or belly. Get help right away if: You have very bad pain. Your pain cannot be controlled. You cannot pee. Summary Gout is painful swelling of the joints. The most common site of pain is the big toe, but it can affect other joints. Medicines and avoiding some foods can help to prevent and treat gout attacks. This information is not intended to replace advice given to you by your health care provider. Make sure you discuss any questions you have with your health care provider. Document Revised: 06/02/2021 Document Reviewed: 06/02/2021 Elsevier Patient Education  2023 Elsevier Inc.  

## 2022-03-30 NOTE — Progress Notes (Signed)
Subjective:   Patient ID: [name], female   DOB: 81 y.o.   MRN: [id number]   HPI Patient presents with several different problems with 1 being pain that occur sporadically around the first MPJ right over left #1 #2 is nail disease left big toe with thickness of the distal two thirds of the bed over a 54-month  And also generalized numbness of both feet   ROS      Objective:  Physical Exam  Did not detect a difference in neurovascular status from previous visit with patient found to have good range of motion and no muscle strength loss.  Patient is found to have mild complaints of numbness on both feet but no balance issues has discoloration of the left hallux nail distal two thirds and does have possible gout not present currently based on her description      Assessment:  Difficult to make complete determination but this may be a gout situation which I will have to evaluate in future but I did give her sheets today discussing foods to avoid and I did dispense formula 7 to try to help with the nail disease left do not recommend current treatment for neuropathy but did discuss with her and if it were to get worse we will consider medicines for possible neurological consult     Plan:  Above did go over what I plan to do with this patient

## 2022-04-01 ENCOUNTER — Ambulatory Visit (INDEPENDENT_AMBULATORY_CARE_PROVIDER_SITE_OTHER): Payer: Medicare Other | Admitting: Internal Medicine

## 2022-04-01 ENCOUNTER — Encounter: Payer: Self-pay | Admitting: Internal Medicine

## 2022-04-01 VITALS — BP 140/68 | HR 92 | Ht 64.0 in | Wt 154.2 lb

## 2022-04-01 DIAGNOSIS — R110 Nausea: Secondary | ICD-10-CM

## 2022-04-01 DIAGNOSIS — R1013 Epigastric pain: Secondary | ICD-10-CM

## 2022-04-01 DIAGNOSIS — Z86010 Personal history of colonic polyps: Secondary | ICD-10-CM

## 2022-04-01 MED ORDER — PANTOPRAZOLE SODIUM 40 MG PO TBEC: 40.0000 mg | DELAYED_RELEASE_TABLET | Freq: Every day | ORAL | 0 refills | Status: DC

## 2022-04-01 NOTE — Progress Notes (Signed)
Chief Complaint: Constipation, nausea  HPI : 81 year old female with history of asthma, breast cancer s/p mastectomy, DDD, CVA, prior PUD presents for follow up of constipation and nausea  Interval History: Overall patient has been doing relatively well. She has been taking her pantoprazole BID, and thinks that this may be helping slightly with her nausea and abdominal discomfort. She does still have some issues with nausea. Denies vomiting. She is using Zofran PRN, which seems to help with her symptoms. She will also use Pepto Bismol PRN to help with the nausea as well. She is no longer having any issues with fecal incontinence. She does still have some constipation every once in a while. She is taking her Metamucil medication, which does seem to be helping with her bowel habits. She is not having to take any Miralax. She would like to be on the minimum amount of medications possible to keep her symptoms under control. Her granddaughter recently adopted 2 foster children so they were able to be part of that ceremony.  Wt Readings from Last 3 Encounters:  04/01/22 154 lb 3.2 oz (69.9 kg)  02/24/22 154 lb (69.9 kg)  02/01/22 154 lb (69.9 kg)   Current Outpatient Medications  Medication Sig Dispense Refill   albuterol (VENTOLIN HFA) 108 (90 Base) MCG/ACT inhaler Inhale 1-2 puffs into the lungs every 4 (four) hours as needed for wheezing or shortness of breath. 6.7 g 0   amLODipine (NORVASC) 5 MG tablet Take 5 mg by mouth daily.     atorvastatin (LIPITOR) 80 MG tablet Take 1 tablet (80 mg total) by mouth daily at 6 PM. 30 tablet 3   azelastine (ASTELIN) 0.1 % nasal spray Place 1 spray into both nostrils 2 (two) times daily. Use in each nostril as directed 30 mL 3   blood glucose meter kit and supplies KIT Dispense based on patient and insurance preference. Use up to four times daily as directed. . For QAC - HS accuchecks. 1 each 0   clobetasol cream (TEMOVATE) 0.05 % Apply 1 application topically  daily at 6 (six) AM.     clonazePAM (KLONOPIN) 1 MG tablet Take 1 mg by mouth 2 (two) times daily as needed for anxiety.     clopidogrel (PLAVIX) 75 MG tablet Take 1 tablet (75 mg total) by mouth daily. 90 tablet 2   DULoxetine (CYMBALTA) 30 MG capsule Take 30 mg by mouth daily.     fluticasone (FLONASE) 50 MCG/ACT nasal spray Place 1 spray into both nostrils daily. 16 g 3   Fluticasone-Salmeterol (ADVAIR) 250-50 MCG/DOSE AEPB Inhale 1 puff into the lungs 2 (two) times daily.     isosorbide mononitrate (IMDUR) 30 MG 24 hr tablet TAKE 1 TABLET BY MOUTH  DAILY 90 tablet 3   loratadine (CLARITIN) 10 MG tablet Take 10 mg by mouth daily.     montelukast (SINGULAIR) 10 MG tablet Take 1 tablet (10 mg total) by mouth at bedtime. 30 tablet 0   nitroGLYCERIN (NITROSTAT) 0.4 MG SL tablet Place 1 tablet (0.4 mg total) under the tongue every 5 (five) minutes as needed for chest pain. 25 tablet 6   nortriptyline (PAMELOR) 10 MG capsule Take 2 capsules by mouth daily.     ondansetron (ZOFRAN) 4 MG tablet Take 1 tablet (4 mg total) by mouth every 8 (eight) hours as needed for nausea or vomiting. 30 tablet 0   pantoprazole (PROTONIX) 40 MG tablet Take 1 tablet (40 mg total) by mouth 2 (two)  times daily. 120 tablet 0   No current facility-administered medications for this visit.   Review of Systems: All systems reviewed and negative except where noted in HPI.   Physical Exam: BP 140/68   Pulse 92   Ht $R'5\' 4"'AQ$  (1.626 m)   Wt 154 lb 3.2 oz (69.9 kg)   BMI 26.47 kg/m  Constitutional: Pleasant,well-developed, female in no acute distress. HEENT: Normocephalic and atraumatic. Conjunctivae are normal. No scleral icterus. Cardiovascular: Normal rate, regular rhythm.  Pulmonary/chest: Effort normal and breath sounds normal. No wheezing, rales or rhonchi. Abdominal: Soft, nondistended, nontender. Bowel sounds active throughout. There are no masses palpable. No hepatomegaly. Extremities: No edema Neurological:  Alert and oriented to person place and time. Skin: Skin is warm and dry. No rashes noted. Psychiatric: Normal mood and affect. Behavior is normal.  Labs 12/23/21: CBC nml. CMP unremarkable.  CT A/P w/contrast 12/23/21: IMPRESSION: 1. Findings above consistent with infectious or inflammatory proctitis. 2.  Aortic Atherosclerosis (ICD10-I70.0).  EGD 03/04/14: IMPRESSION: 1) Duodenal ulcers - nonbleeding. 2) Gastritis.  EGD 02/24/22: - White nummular lesions in esophageal mucosa. Biopsied. - Multiple gastric polyps. Biopsied. - Gastritis. Biopsied. - Duodenitis. Biopsied. Path: 1. Surgical [P], duodenal - DUODENAL MUCOSA WITH PROMINENT BRUNNER'S GLANDS AND FOCAL FOVEOLAR METAPLASIA CONSISTENT WITH CHRONIC PEPTIC DUODENITIS. 2. Surgical [P], gastric - ANTRAL MUCOSA WITH FEATURES OF BOTH CHEMICAL/REACTIVE CHANGE AND MILD CHRONIC INACTIVE GASTRITIS. - OXYNTIC MUCOSA WITH NO SIGNIFICANT PATHOLOGY. - NO HELICOBACTER PYLORI ORGANISMS IDENTIFIED ON H&E STAINED SLIDE. 3. Surgical [P], gastric polyps - FUNDIC GLAND POLYP. 4. Surgical [P], esophagus - SQUAMOUS MUCOSA WITH NO SIGNIFICANT PATHOLOGY.  Flexible sigmoidoscopy 02/24/22: - Five 3 to 6 mm polyps in the sigmoid colon, in the descending colon and in the transverse colon, removed with a cold snare. Resected and retrieved. - One 11 mm polyp in the sigmoid colon, removed with a cold snare. Resected and retrieved. - Non-bleeding internal hemorrhoids. Path: 5. Surgical [P], colon, sigmoid, descending, and transverse - FRAGMENTS OF LOW-GRADE DYSPLASIA/TUBULAR ADENOMATOUS EPITHELIUM. - FRAGMENTS OF SESSILE SERRATED POLYP (GIVEN THE MULTIPLICITY OF POLYPS AND FRAGMENTED NATURE OF THE SPECIMEN, IT IS NOT MORPHOLOGICALLY POSSIBLE TO DISTINGUISH SEPARATE SESSILE SERRATED POLYPS AND TUBULAR ADENOMAS FROM POSSIBLE SESSILE SERRATED POLYPS WITH DYSPLASIA). - AN IMMUNOHISTOCHEMICAL STAIN FOR MLH1 SHOWS NO LOSS OF STAINING. 6. Surgical [P], colon,  sigmoid, polyp (1) - TUBULAR ADENOMA.  ASSESSMENT AND PLAN: Proctitis Constipation Fecal incontinence Nausea Epigastric discomfort Bloating History of colon polyps Family history of colon cancer Overall patient seems to be doing better. She is no longer having any fecal incontinence. Still has some occasional constipation, though she states that she is currently happy with the state of her GI symptoms. She does still have some nausea, but this improves with use of Zofran and Pepto Bismol PRN. Patient was seen on her last EGD to have both gastritis and duodenitis so has been on PPI BID therapy, which has helped. Will have her drop down to PPI QD to see if she tolerates this okay. Her last colonoscopy did not show any signs of proctitis. She did have several precancerous colon polyps that were removed. Patient may be interested in considering a repeat FS in the future for polyp surveillance. Thus will place a recall to discuss this further in 3 years.  - Continue daily Metamucil - Drop down from Pantoprazole BID to QD - Okay to use Pepto Bismol PRN - Zofran PRN - Recall for FS LEC in 3 years to discuss whether or not  to pursue another FS procedure  -RTC PRN  Christia Reading, MD  I spent 40 minutes of time, including in depth chart review, independent review of results as outlined above, communicating results with the patient directly, face-to-face time with the patient, coordinating care, and ordering studies and medications as appropriate, and documentation.

## 2022-04-01 NOTE — Patient Instructions (Signed)
If you are age 82 or older, your body mass index should be between 23-30. Your Body mass index is 26.47 kg/m. If this is out of the aforementioned range listed, please consider follow up with your Primary Care Provider.  If you are age 37 or younger, your body mass index should be between 19-25. Your Body mass index is 26.47 kg/m. If this is out of the aformentioned range listed, please consider follow up with your Primary Care Provider.   ________________________________________________________  The [facility] providers would like to encourage you to use [facility] to communicate with providers for non-urgent requests or questions.  Due to long hold times on the telephone, sending your provider a message by [facility] may be a faster and more efficient way to get a response.  Please allow 48 business hours for a response.  Please remember that this is for non-urgent requests.  _______________________________________________________  DECREASE: pantoprazole to once daily.  You can take Metamucil daily.  OK to use pepto bismol as needed.    Thank you for entrusting me with your care and choosing [facility].  Dr Lorenso Courier

## 2022-04-05 DIAGNOSIS — J069 Acute upper respiratory infection, unspecified: Secondary | ICD-10-CM | POA: Diagnosis not present

## 2022-04-05 DIAGNOSIS — J040 Acute laryngitis: Secondary | ICD-10-CM | POA: Diagnosis not present

## 2022-04-05 DIAGNOSIS — J209 Acute bronchitis, unspecified: Secondary | ICD-10-CM | POA: Diagnosis not present

## 2022-04-06 ENCOUNTER — Other Ambulatory Visit: Payer: Self-pay | Admitting: Internal Medicine

## 2022-04-06 DIAGNOSIS — R1013 Epigastric pain: Secondary | ICD-10-CM

## 2022-05-12 DIAGNOSIS — J45909 Unspecified asthma, uncomplicated: Secondary | ICD-10-CM | POA: Diagnosis not present

## 2022-05-12 DIAGNOSIS — I10 Essential (primary) hypertension: Secondary | ICD-10-CM | POA: Diagnosis not present

## 2022-05-12 DIAGNOSIS — I700 Atherosclerosis of aorta: Secondary | ICD-10-CM | POA: Diagnosis not present

## 2022-05-12 DIAGNOSIS — E119 Type 2 diabetes mellitus without complications: Secondary | ICD-10-CM | POA: Diagnosis not present

## 2022-05-12 DIAGNOSIS — I7774 Dissection of vertebral artery: Secondary | ICD-10-CM | POA: Diagnosis not present

## 2022-05-12 DIAGNOSIS — K5900 Constipation, unspecified: Secondary | ICD-10-CM | POA: Diagnosis not present

## 2022-05-12 DIAGNOSIS — F411 Generalized anxiety disorder: Secondary | ICD-10-CM | POA: Diagnosis not present

## 2022-05-12 DIAGNOSIS — E1165 Type 2 diabetes mellitus with hyperglycemia: Secondary | ICD-10-CM | POA: Diagnosis not present

## 2022-05-12 DIAGNOSIS — Z0000 Encounter for general adult medical examination without abnormal findings: Secondary | ICD-10-CM | POA: Diagnosis not present

## 2022-05-12 DIAGNOSIS — J439 Emphysema, unspecified: Secondary | ICD-10-CM | POA: Diagnosis not present

## 2022-05-17 ENCOUNTER — Other Ambulatory Visit: Payer: Self-pay | Admitting: Family Medicine

## 2022-05-17 DIAGNOSIS — Z1231 Encounter for screening mammogram for malignant neoplasm of breast: Secondary | ICD-10-CM

## 2022-06-13 ENCOUNTER — Ambulatory Visit: Payer: Medicare Other

## 2022-06-13 DIAGNOSIS — S83242A Other tear of medial meniscus, current injury, left knee, initial encounter: Secondary | ICD-10-CM | POA: Diagnosis not present

## 2022-06-14 ENCOUNTER — Ambulatory Visit
Admission: RE | Admit: 2022-06-14 | Discharge: 2022-06-14 | Disposition: A | Discharge status: Home / Self Care | Payer: Medicare Other | Source: Ambulatory Visit | Attending: Family Medicine | Admitting: Family Medicine

## 2022-06-14 DIAGNOSIS — Z1231 Encounter for screening mammogram for malignant neoplasm of breast: Secondary | ICD-10-CM

## 2022-06-15 ENCOUNTER — Other Ambulatory Visit: Payer: Self-pay | Admitting: Cardiovascular Disease

## 2022-06-16 DIAGNOSIS — M25562 Pain in left knee: Secondary | ICD-10-CM | POA: Diagnosis not present

## 2022-06-20 ENCOUNTER — Encounter: Payer: Self-pay | Admitting: Cardiovascular Disease

## 2022-06-20 NOTE — Progress Notes (Unsigned)
Cardiology Office Note:    Date:  06/20/2022   ID:  [name], DOB [birth year], MRN [id number]  PCP:  Collene Leyden, MD  Cardiologist:  [name], MD    Referring MD: Collene Leyden, MD   Chief Complaint  Patient presents with   Coronary Artery Disease        Shortness of Breath   Problem list 1. Shortness of breath with exertion - Asthma  2. Hyperlipidemia 3. Hypertension 4. CVA - on Plavix       [name] is a 81 y.o. female with a hx of DOE Hx of TIA years ago, was started on Plavix at that time Has no energy.   Gets short of breath walking to the mailbox and back  ( 100 yards)  No CP or tightness.    No syncope.    Has had a chronic cough / throat clearing . Had blood work at primary MD Alroy Dust)   Has significant vertebral Luevenia Maxin arterial disease. Dr. Erlinda Hong has given her a minimum  BP goal of 120-150  Dr. Mickle Plumb records from [place] reviewed  Sept. 5, 2019:  Seen for DOE and generalized fatigue Went to [facility] pulmonary and was told she had asthma Takes her inhalers.  Went to her primary care for her fatigue - was told to decrease her carbs   Denies any chest pain  Has headaches and has some dizziness ( history of a TIA in the past )  Does not get any regular exercise   Aug. 25, 2020   Has had worsening DOE recently  Similar to last year  Having severe DOE  Has seen pulmonary md in [place] of chest tightness.  I hear no wheezing on exam today   Oct. 30, 2020   [name] is seen today for follow-up of her severe shortness of breath with exertion.  We performed a right and left heart catheterization on her on March 29, 2019. The mid LAD had a 30% stenosis.  Fed normal left ventricular end-diastolic pressure with an EDP of 6 mmHg.  The left ventricular systolic function was normal with an ejection fraction of 55 to 65%. Right heart pressures were unremarkable.  Mean PA pressure was 40 mmHg.  Pulmonary capillary wedge  pressure was 5.  Cardiac output was 7.3 L/min.  Feeling some better.   Still does not have the breathing capacity .  Has been walking some.   Seems to feel better on the Imdur   February 12, 2020: [name] is seen today for follow-up of her shortness of breath.  We performed a heart catheterization in July, 2020.   Their son is still in the hospital following surgery on his spine.   Lots of complications / infection Stressed but is otherwise doing well from a cardiac standpoint   Sept. 22, 2022 Was just hospitalized for CHF and pneumonia Echocardiogram from May 26, 2021 reveals normal left ventricular systolic function.  She has grade 1 diastolic dysfunction. Mild AI. Was treaded with lasix and leviquin    Feeling a bit better.   Oct. 10, 2023 [name] is seen for follow up of her dyspnea  Past Medical History:  Diagnosis Date   Aortic atherosclerosis ([place])    Asthma    Breast CA ([place])    Cancer ([place])    right breast mastectomy   DDD (degenerative disc disease), cervical    Duodenal ulcer    External hemorrhoids  Generalized anxiety disorder    H/O chest pain 03/24/2006   normal nuclear  study   Headache    High cholesterol    Hypertension    Mild aortic insufficiency 03/28/2006   echo   Normal cardiac stress test 2005   Pancreatitis    Personal history of chemotherapy    Personal history of radiation therapy    PONV (postoperative nausea and vomiting)    Sinus bradycardia on ECG    Stroke [facility])     Past Surgical History:  Procedure Laterality Date   ABDOMINAL HYSTERECTOMY     APPENDECTOMY     "30 years ago"   childbirth     x 2   CHOLECYSTECTOMY     "15 years ago"   ESOPHAGOGASTRODUODENOSCOPY N/A 03/04/2014   Procedure: ESOPHAGOGASTRODUODENOSCOPY (EGD);  Surgeon: [name], MD;  Location[facility] ENDOSCOPY;  Service: Endoscopy;  Laterality: N/A;   MASTECTOMY  1993   right breast   RIGHT/LEFT HEART CATH AND CORONARY ANGIOGRAPHY N/A 05/30/2019    Procedure: RIGHT/LEFT HEART CATH AND CORONARY ANGIOGRAPHY;  Surgeon: [name], MD;  Location: [facility] LAB;  Service: Cardiovascular;  Laterality: N/A;   TUBAL LIGATION      Current Medications: No outpatient medications have been marked as taking for the 06/21/22 encounter (Office Visit) with Alexes Menchaca, Wonda Cheng, MD.     Allergies:   Ciprofloxacin, Codeine, Doxycycline, Percocet [oxycodone-acetaminophen], and Tramadol   Social History   Socioeconomic History   Marital status: Married    Spouse name: Tommy    Number of children: 2   Years of education: 12+   Highest education level: Not on file  Occupational History   Occupation: retired  Tobacco Use   Smoking status: Never   Smokeless tobacco: Never  Vaping Use   Vaping Use: Never used  Substance and Sexual Activity   Alcohol use: No    Alcohol/week: 0.0 standard drinks of alcohol   Drug use: No   Sexual activity: Not Currently  Other Topics Concern   Not on file  Social History Narrative   Right handed, caffeine 2 cups daily, married, 4 kids, Retired.  [employer] Grad.    Social Determinants of Health   Financial Resource Strain: Not on file  Food Insecurity: Not on file  Transportation Needs: Not on file  Physical Activity: Not on file  Stress: Not on file  Social Connections: Not on file     Family History: The patient's family history includes Asthma in her mother; Colon cancer (age of onset: 23) in her sister; Heart attack in her father, mother, sister, sister, and sister; Heart disease in her father and mother; Hypertension in her mother, sister, and sister; Lung disease in her daughter; Thyroid disease in her daughter. ROS:   Please see the history of present illness.     All other systems reviewed and are negative.  EKGs/Labs/Other Studies Reviewed:    The following studies were reviewed today:   EKG:        Recent Labs: 07/05/2021: B Natriuretic Peptide 53.9 12/23/2021: ALT 15; BUN 16;  Creatinine, Ser 0.88; Hemoglobin 13.9; Platelets 256; Potassium 4.3; Sodium 137  Recent Lipid Panel    Component Value Date/Time   CHOL 109 07/12/2019 0925   TRIG 148 07/12/2019 0925   HDL 46 07/12/2019 0925   CHOLHDL 2.4 07/12/2019 0925   CHOLHDL 6.1 04/28/2015 0645   VLDL 60 (H) 04/28/2015 0645   LDLCALC 38 07/12/2019 0925    Physical  Exam: There were no vitals taken for this visit.  No BP recorded.  {Refresh Note OR Click here to enter BP  :1}***    GEN:  Well nourished, well developed in no acute distress HEENT: Normal NECK: No JVD; No carotid bruits LYMPHATICS: No lymphadenopathy CARDIAC: RRR ***, no murmurs, rubs, gallops RESPIRATORY:  Clear to auscultation without rales, wheezing or rhonchi  ABDOMEN: Soft, non-tender, non-distended MUSCULOSKELETAL:  No edema; No deformity  SKIN: Warm and dry NEUROLOGIC:  Alert and oriented x 3    ASSESSMENT:    No diagnosis found.   PLAN:    1. Shortness of breath with exertion:    2. Hypertension:        3. Hyperlipidemia:     In order of problems listed above: Medication Adjustments/Labs and Tests Ordered: Current medicines are reviewed at length with the patient today.  Concerns regarding medicines are outlined above.  No orders of the defined types were placed in this encounter.   No orders of the defined types were placed in this encounter.     Signed, [name], MD  06/20/2022 9:06 PM    [place]

## 2022-06-21 ENCOUNTER — Encounter: Payer: Self-pay | Admitting: Cardiovascular Disease

## 2022-06-21 ENCOUNTER — Ambulatory Visit: Payer: Medicare Other | Attending: Cardiovascular Disease | Admitting: Cardiovascular Disease

## 2022-06-21 VITALS — BP 122/72 | HR 98 | Ht 65.0 in | Wt 157.0 lb

## 2022-06-21 DIAGNOSIS — I209 Angina pectoris, unspecified: Secondary | ICD-10-CM | POA: Insufficient documentation

## 2022-06-21 DIAGNOSIS — I10 Essential (primary) hypertension: Secondary | ICD-10-CM | POA: Insufficient documentation

## 2022-06-21 NOTE — Patient Instructions (Signed)
Medication Instructions:  NO CHANGES *If you need a refill on your cardiac medications before your next appointment, please call your pharmacy*   Lab Work: NONE If you have labs (blood work) drawn today and your tests are completely normal, you will receive your results only by: [place] (if you have MyChart) OR A paper copy in the mail If you have any lab test that is abnormal or we need to change your treatment, we will call you to review the results.   Testing/Procedures: NONE   Follow-Up: At [facility], you and your health needs are our priority.  As part of our continuing mission to provide you with exceptional heart care, we have created designated Provider Care Teams.  These Care Teams include your primary Cardiologist (physician) and Advanced Practice Providers (APPs -  Physician Assistants and Nurse Practitioners) who all work together to provide you with the care you need, when you need it.  We recommend signing up for the patient portal called "MyChart".  Sign up information is provided on this After Visit Summary.  MyChart is used to connect with patients for Virtual Visits (Telemedicine).  Patients are able to view lab/test results, encounter notes, upcoming appointments, etc.  Non-urgent messages can be sent to your provider as well.   To learn more about what you can do with MyChart, go to [email or web address].    Your next appointment:   1 year(s)  The format for your next appointment:   In Person  Provider:   Christen Bame, NP or Richardson Dopp, PA-C     T  Other Instructions NONE  Important Information About Sugar

## 2022-06-22 DIAGNOSIS — M25562 Pain in left knee: Secondary | ICD-10-CM | POA: Diagnosis not present

## 2022-06-26 DIAGNOSIS — M25562 Pain in left knee: Secondary | ICD-10-CM | POA: Diagnosis not present

## 2022-06-28 ENCOUNTER — Telehealth: Payer: Self-pay

## 2022-06-28 NOTE — Patient Outreach (Signed)
  Care Coordination   Initial Visit Note   06/28/2022 Name: ZYRIAH MASK MRN: [id number] DOB: 07/18/1941  ANUM PALECEK is a 81 y.o. year old female who sees Collene Leyden, MD for primary care. I spoke with  Francesco Runner by phone today.  What matters to the patients health and wellness today?  Patient voices that she and her spouse are both doing well. Denies any acute issues or concerns at present. No issues with meds or transportation reported.    Goals Addressed             This Visit's Progress    COMPLETED: Care Coordination-no follow up required       Care Coordination Interventions: Advised patient to schedule flu vaccine. Pt states she has had AWV-confirmed in EMR completed on 05/12/22 Provided education to patient re: [facility] services Assessed social determinant of health barriers          SDOH assessments and interventions completed:  Yes  SDOH Interventions Today    Flowsheet Row Most Recent Value  SDOH Interventions   Food Insecurity Interventions Intervention Not Indicated  Transportation Interventions Intervention Not Indicated        Care Coordination Interventions Activated:  Yes  Care Coordination Interventions:  Yes, provided   Follow up plan: No further intervention required.   Encounter Outcome:  Pt. Visit Completed   Enzo Montgomery, RN,BSN,CCM [place] [facility] Telephonic Care Management Coordinator Direct Phone: [phone] Toll Free: [phone] Fax: [phone]

## 2022-07-05 DIAGNOSIS — M79601 Pain in right arm: Secondary | ICD-10-CM | POA: Diagnosis not present

## 2022-07-06 ENCOUNTER — Other Ambulatory Visit: Payer: Self-pay | Admitting: Cardiovascular Disease

## 2022-07-11 ENCOUNTER — Telehealth: Payer: Self-pay | Admitting: *Deleted

## 2022-07-11 DIAGNOSIS — M25562 Pain in left knee: Secondary | ICD-10-CM | POA: Diagnosis not present

## 2022-07-11 NOTE — Telephone Encounter (Signed)
   Pre-operative Risk Assessment    Patient Name: [name]  DOB: [birth year] MRN: [id number]      Request for Surgical Clearance    Procedure:   LEFT KNEE SCOPE MEDICAL MENISECTOMY  Date of Surgery:  Clearance TBD                                 Surgeon:  DR. Edmonia Lynch Surgeon's Group or Practice Name:  [facility] Phone number:  [phone] EXT 1898 ATTN: [place] Fax number:  [phone]   Type of Clearance Requested:   - Medical  - Pharmacy:  Hold Clopidogrel (Plavix)     Type of Anesthesia:   CHOICE   Additional requests/questions:    Jiles Prows   07/11/2022, 6:04 PM

## 2022-07-12 DIAGNOSIS — E1165 Type 2 diabetes mellitus with hyperglycemia: Secondary | ICD-10-CM | POA: Diagnosis not present

## 2022-07-12 DIAGNOSIS — M25562 Pain in left knee: Secondary | ICD-10-CM | POA: Diagnosis not present

## 2022-07-12 NOTE — Telephone Encounter (Signed)
   Name: [name]  DOB: 1941-09-04  MRN: [id number]  Primary Cardiologist: [name], MD  Chart reviewed as part of pre-operative protocol coverage. Because of [name]'s past medical history and time since last visit, she will require a follow-up telephone visit in order to better assess preoperative cardiovascular risk.  Pre-op covering staff: - Please schedule appointment and call patient to inform them. If patient already had an upcoming appointment within acceptable timeframe, please add "pre-op clearance" to the appointment notes so provider is aware. - Please contact requesting surgeon's office via preferred method (i.e, phone, fax) to inform them of need for appointment prior to surgery.  This message will also be routed to pharmacy pool and/or Dr [name] for input on holding Plavix as requested below so that this information is available to the clearing provider at time of patient's appointment.   On Plavix for hx of remote TIA.   Finis Bud, NP  07/12/2022, 10:09 AM

## 2022-07-13 ENCOUNTER — Ambulatory Visit: Payer: Medicare Other | Attending: Nurse Practitioner | Admitting: Nurse Practitioner

## 2022-07-13 ENCOUNTER — Telehealth: Payer: Self-pay | Admitting: Cardiovascular Disease

## 2022-07-13 DIAGNOSIS — Z01810 Encounter for preprocedural cardiovascular examination: Secondary | ICD-10-CM

## 2022-07-13 NOTE — Telephone Encounter (Signed)
I s/w the pt and she tells me that she is in a lot of pain and anxious. She just want to get her knee surgery done. I explained to her about Plavix that Dr. [name] said Neuro will need to give clearance for Plavix. Pt states she does not even see Neuro anylonger, she states PCP takes care of her Plavix. I then advised surgeon offie will need to get clearance for Plavix from PCP. Pt tells me she has been holding her Plavix since Sunday 07/10/22 as on her on accord. I asked the pt to bear with me and let me see what I can do to help her and try to clear her for her surgery so she can feel better.

## 2022-07-13 NOTE — Progress Notes (Addendum)
Virtual Visit via Telephone Note   Because of [name]'s co-morbid illnesses, she is at least at moderate risk for complications without adequate follow up.  This format is felt to be most appropriate for this patient at this time.  The patient did not have access to video technology/had technical difficulties with video requiring transitioning to audio format only (telephone).  All issues noted in this document were discussed and addressed.  No physical exam could be performed with this format.  Please refer to the patient's chart for her consent to telehealth for [facility].  Evaluation Performed:  Preoperative cardiovascular risk assessment _____________   Date:  07/13/2022   Patient ID:  [name], DOB 03-15-1941, MRN [id number] Patient Location:  Home Provider location:   Office  Primary Care Provider:  Collene Leyden, MD Primary Cardiologist:  [name], MD  Chief Complaint / Patient Profile   81 y.o. y/o female with a h/o HLD, HTN, past TIA on plavix, hx of DOE, asthma, CHF, and chronic SOB who is pending left knee scope medical menisectomy and presents today for telephonic preoperative cardiovascular risk assessment.  Past Medical History    Past Medical History:  Diagnosis Date   Aortic atherosclerosis ([place])    Asthma    Breast CA ([place])    Cancer ([place])    right breast mastectomy   DDD (degenerative disc disease), cervical    Duodenal ulcer    External hemorrhoids    Generalized anxiety disorder    H/O chest pain 03/24/2006   normal nuclear  study   Headache    High cholesterol    Hypertension    Mild aortic insufficiency 03/28/2006   echo   Normal cardiac stress test 2005   Pancreatitis    Personal history of chemotherapy    Personal history of radiation therapy    PONV (postoperative nausea and vomiting)    Sinus bradycardia on ECG    Stroke [facility])    Past Surgical History:  Procedure Laterality Date   ABDOMINAL HYSTERECTOMY      APPENDECTOMY     "30 years ago"   childbirth     x 2   CHOLECYSTECTOMY     "15 years ago"   ESOPHAGOGASTRODUODENOSCOPY N/A 03/04/2014   Procedure: ESOPHAGOGASTRODUODENOSCOPY (EGD);  Surgeon: [name], MD;  Location: [facility] ENDOSCOPY;  Service: Endoscopy;  Laterality: N/A;   MASTECTOMY  1993   right breast   RIGHT/LEFT HEART CATH AND CORONARY ANGIOGRAPHY N/A 05/30/2019   Procedure: RIGHT/LEFT HEART CATH AND CORONARY ANGIOGRAPHY;  Surgeon: [name], MD;  Location: [place] CV LAB;  Service: Cardiovascular;  Laterality: N/A;   TUBAL LIGATION      Allergies  Allergies  Allergen Reactions   Ciprofloxacin     Other reaction(s): Other (See Comments)   Codeine Nausea And Vomiting   Doxycycline Nausea And Vomiting    REACTION: GI upset   Percocet [Oxycodone-Acetaminophen] Nausea And Vomiting   Tramadol     Other reaction(s): Other (See Comments)    History of Present Illness    [name] is a 81 y.o. female who presents via audio/video conferencing for a telehealth visit today.  Pt was last seen in cardiology clinic on 06/21/22 by Dr. [name].  At that time TOBY AYAD was doing well. SHOB was stable.  The patient is now pending procedure as outlined above.  Date of surgery is TBD.  Surgeon will be Dr. Edmonia [facility].  Our office has been consulted regarding holding Plavix.  Since her last visit, she has been doing well.  She said her breathing has improved since she last saw her cardiologist last month.  She is doing well from a cardiac perspective.  She denies any chest pain, shortness of breath, palpitations, syncope, presyncope, dizziness, orthopnea, PND, swelling, significant weight changes, bleeding, or claudication.  Stated she has stopped taking her Plavix as of Sunday, 07/10/22.  She is unsure of when her upcoming surgery will be, but she thinks they might be able to get her in or surgery by the end of this week or early next week for  surgery.  She states her primary care provider, Dr. Collene Leyden, is the one who refills her Plavix for her and manages this for her.  Denies any other questions or concerns today.  Home Medications    Prior to Admission medications   Medication Sig Start Date End Date Taking? Authorizing Provider  albuterol (VENTOLIN HFA) 108 (90 Base) MCG/ACT inhaler Inhale 1-2 puffs into the lungs every 4 (four) hours as needed for wheezing or shortness of breath. 05/29/21   Thurnell Lose, MD  amLODipine (NORVASC) 5 MG tablet Take 5 mg by mouth daily.    [provider]  atorvastatin (LIPITOR) 80 MG tablet Take 1 tablet (80 mg total) by mouth daily at 6 PM. 07/06/15   Rosalin Hawking, MD  azelastine (ASTELIN) 0.1 % nasal spray Place 1 spray into both nostrils 2 (two) times daily. Use in each nostril as directed 07/06/21   Martyn Ehrich, NP  clobetasol cream (TEMOVATE) 0.35 % Apply 1 application topically daily at 6 (six) AM. 12/23/20   [provider]  clonazePAM (KLONOPIN) 1 MG tablet Take 1 mg by mouth 2 (two) times daily as needed for anxiety. 08/24/20   [provider]  clopidogrel (PLAVIX) 75 MG tablet Take 1 tablet (75 mg total) by mouth daily. 06/01/17   Rosalin Hawking, MD  DULoxetine (CYMBALTA) 30 MG capsule Take 30 mg by mouth daily. 05/05/21   [provider]  Fluticasone-Salmeterol (ADVAIR) 250-50 MCG/DOSE AEPB Inhale 1 puff into the lungs 2 (two) times daily.    [provider]  isosorbide mononitrate (IMDUR) 30 MG 24 hr tablet TAKE 1 TABLET BY MOUTH DAILY 07/07/22   [name], Wonda Cheng, MD  loratadine (CLARITIN) 10 MG tablet Take 10 mg by mouth daily.    [provider]  metFORMIN (GLUCOPHAGE-XR) 500 MG 24 hr tablet Take 1 tablet by mouth in the morning, at noon, and at bedtime. 03/05/22   [provider]  montelukast (SINGULAIR) 10 MG tablet Take 1 tablet (10 mg total) by mouth at bedtime. 05/29/21   Thurnell Lose, MD  nitroGLYCERIN  (NITROSTAT) 0.4 MG SL tablet Place 1 tablet (0.4 mg total) under the tongue every 5 (five) minutes as needed for chest pain. 05/07/19   [name], Wonda Cheng, MD  nortriptyline (PAMELOR) 10 MG capsule Take 2 capsules by mouth daily. 06/27/17   [provider]  ondansetron (ZOFRAN) 4 MG tablet Take 1 tablet (4 mg total) by mouth every 8 (eight) hours as needed for nausea or vomiting. 02/01/22   Sharyn Creamer, MD  pantoprazole (PROTONIX) 40 MG tablet Take 1 tablet (40 mg total) by mouth daily. 04/07/22   Sharyn Creamer, MD    Physical Exam    Vital Signs:  LASHONNA RIEKE does not have vital signs available for review today.  Given telephonic nature of communication, physical exam is limited. AAOx3. NAD. Normal affect.  Speech and respirations are unlabored.  Accessory Clinical Findings    None  Assessment & Plan    1.  Preoperative Cardiovascular Risk Assessment:  Ms. Malveaux perioperative risk of a major cardiac event is 0.9% according to the Revised Cardiac Risk Index (RCRI).  Therefore, she is at low risk for perioperative complications.   Her functional capacity is excellent at 6.05 METs according to the Duke Activity Status Index (DASI). Recommendations: According to ACC/AHA guidelines, no further cardiovascular testing needed.  The patient may proceed to surgery at acceptable risk.   Antiplatelet and/or Anticoagulation Recommendations: From a cardiac perspective, she is okay to hold Plavix for 5 days prior to procedure.  However, because cardiology did not originally prescribe her Plavix, I recommend that she weighs in with her primary care provider to see if Plavix can be held 5 days prior to surgery, since they are managing this.  I will route this note over to primary care provider (Dr. Collene Leyden) who will take it over from there.  I also recommended that she talk to her knee surgeon (Dr. Edmonia Lynch) and update his office tomorrow morning that she has been holding Plavix since  Sunday, 07/10/22. Discussed risks associated with being off Plavix (including CVA or TIA) without a surgery date scheduled.   She is not on any antiplatelet therapy.  She does not require SBE prophylaxis prior to surgery.  The patient was advised that if she develops new symptoms prior to surgery to contact our office to arrange for a follow-up visit, and she verbalized understanding.  A copy of this note will be routed to requesting surgeon.  Time:   Today, I have spent 20 minutes with the patient with telehealth technology discussing medical history, symptoms, and management plan.     Finis Bud, NP  07/13/2022, 5:10 PM

## 2022-07-13 NOTE — Telephone Encounter (Signed)
Will forward back to pre op as the pt is asking about the Plavix .

## 2022-07-13 NOTE — Telephone Encounter (Signed)
Patient is calling back anxious for an update on her clearance due to our office being the last one they are needing it from. She states she in a lot of pain and has already stopped taking the Plavix. Please advise.

## 2022-07-13 NOTE — Telephone Encounter (Signed)
   Name: [name]  DOB: [birth year]  MRN: [id number]  Primary Cardiologist: [name], MD  Chart reviewed as part of pre-operative protocol coverage. Because of [name]'s past medical history and time since last visit, she will require a follow-up telephone visit in order to better assess preoperative cardiovascular risk.  Pre-op covering staff: - Please schedule appointment and call patient to inform them. If patient already had an upcoming appointment within acceptable timeframe, please add "pre-op clearance" to the appointment notes so provider is aware. - Please contact requesting surgeon's office via preferred method (i.e, phone, fax) to inform them of need for appointment prior to surgery.  From a cardiac standpoint, patient is okay to hold Plavix for 5 days prior to procedure.  Patient needs to consult neurology regarding if it is okay to hold Plavix for 5 days prior to procedure as they were the ones who initially prescribed this medication for patient's past history of TIA.  Finis Bud, NP  07/13/2022, 4:19 PM

## 2022-07-13 NOTE — Telephone Encounter (Signed)
Pt is calling to get update on this clearance. Requesting call back.

## 2022-07-21 DIAGNOSIS — S83242A Other tear of medial meniscus, current injury, left knee, initial encounter: Secondary | ICD-10-CM | POA: Diagnosis not present

## 2022-07-21 DIAGNOSIS — M1712 Unilateral primary osteoarthritis, left knee: Secondary | ICD-10-CM | POA: Diagnosis not present

## 2022-07-21 DIAGNOSIS — M2342 Loose body in knee, left knee: Secondary | ICD-10-CM | POA: Diagnosis not present

## 2022-07-21 DIAGNOSIS — S83232A Complex tear of medial meniscus, current injury, left knee, initial encounter: Secondary | ICD-10-CM | POA: Diagnosis not present

## 2022-07-21 DIAGNOSIS — X58XXXA Exposure to other specified factors, initial encounter: Secondary | ICD-10-CM | POA: Diagnosis not present

## 2022-07-21 DIAGNOSIS — M94262 Chondromalacia, left knee: Secondary | ICD-10-CM | POA: Diagnosis not present

## 2022-07-21 DIAGNOSIS — G8918 Other acute postprocedural pain: Secondary | ICD-10-CM | POA: Diagnosis not present

## 2022-07-21 DIAGNOSIS — Y999 Unspecified external cause status: Secondary | ICD-10-CM | POA: Diagnosis not present

## 2022-07-22 ENCOUNTER — Encounter: Payer: Self-pay | Admitting: Physician Assistant

## 2022-08-01 DIAGNOSIS — M1712 Unilateral primary osteoarthritis, left knee: Secondary | ICD-10-CM | POA: Diagnosis not present

## 2022-08-16 DIAGNOSIS — M1812 Unilateral primary osteoarthritis of first carpometacarpal joint, left hand: Secondary | ICD-10-CM | POA: Diagnosis not present

## 2022-08-18 DIAGNOSIS — E119 Type 2 diabetes mellitus without complications: Secondary | ICD-10-CM | POA: Diagnosis not present

## 2022-08-19 DIAGNOSIS — M1712 Unilateral primary osteoarthritis, left knee: Secondary | ICD-10-CM | POA: Diagnosis not present

## 2022-08-24 DIAGNOSIS — Z23 Encounter for immunization: Secondary | ICD-10-CM | POA: Diagnosis not present

## 2022-08-24 DIAGNOSIS — R197 Diarrhea, unspecified: Secondary | ICD-10-CM | POA: Diagnosis not present

## 2022-08-24 DIAGNOSIS — M25562 Pain in left knee: Secondary | ICD-10-CM | POA: Diagnosis not present

## 2022-08-24 DIAGNOSIS — E1165 Type 2 diabetes mellitus with hyperglycemia: Secondary | ICD-10-CM | POA: Diagnosis not present

## 2022-08-24 DIAGNOSIS — R110 Nausea: Secondary | ICD-10-CM | POA: Diagnosis not present

## 2022-08-31 DIAGNOSIS — B079 Viral wart, unspecified: Secondary | ICD-10-CM | POA: Diagnosis not present

## 2022-08-31 DIAGNOSIS — J069 Acute upper respiratory infection, unspecified: Secondary | ICD-10-CM | POA: Diagnosis not present

## 2022-08-31 DIAGNOSIS — J029 Acute pharyngitis, unspecified: Secondary | ICD-10-CM | POA: Diagnosis not present

## 2022-09-08 DIAGNOSIS — J209 Acute bronchitis, unspecified: Secondary | ICD-10-CM | POA: Diagnosis not present

## 2022-09-08 DIAGNOSIS — B079 Viral wart, unspecified: Secondary | ICD-10-CM | POA: Diagnosis not present

## 2022-09-14 DIAGNOSIS — M25662 Stiffness of left knee, not elsewhere classified: Secondary | ICD-10-CM | POA: Diagnosis not present

## 2022-09-14 DIAGNOSIS — M6281 Muscle weakness (generalized): Secondary | ICD-10-CM | POA: Diagnosis not present

## 2022-09-14 DIAGNOSIS — R262 Difficulty in walking, not elsewhere classified: Secondary | ICD-10-CM | POA: Diagnosis not present

## 2022-09-14 DIAGNOSIS — S83232D Complex tear of medial meniscus, current injury, left knee, subsequent encounter: Secondary | ICD-10-CM | POA: Diagnosis not present

## 2022-09-15 ENCOUNTER — Ambulatory Visit (INDEPENDENT_AMBULATORY_CARE_PROVIDER_SITE_OTHER): Payer: PPO

## 2022-09-15 ENCOUNTER — Ambulatory Visit
Admission: RE | Admit: 2022-09-15 | Discharge: 2022-09-15 | Disposition: A | Discharge status: Home / Self Care | Payer: PPO | Source: Ambulatory Visit | Attending: Family Medicine | Admitting: Family Medicine

## 2022-09-15 VITALS — BP 179/98 | HR 89 | Temp 98.6°F | Resp 20

## 2022-09-15 DIAGNOSIS — J4541 Moderate persistent asthma with (acute) exacerbation: Secondary | ICD-10-CM | POA: Diagnosis not present

## 2022-09-15 DIAGNOSIS — J3089 Other allergic rhinitis: Secondary | ICD-10-CM

## 2022-09-15 DIAGNOSIS — R059 Cough, unspecified: Secondary | ICD-10-CM | POA: Diagnosis not present

## 2022-09-15 DIAGNOSIS — R0789 Other chest pain: Secondary | ICD-10-CM | POA: Diagnosis not present

## 2022-09-15 MED ORDER — DEXAMETHASONE SODIUM PHOSPHATE 10 MG/ML IJ SOLN
10.0000 mg | Freq: Once | INTRAMUSCULAR | Status: AC
  Administered 2022-09-15: 10 mg via INTRAMUSCULAR

## 2022-09-15 MED ORDER — PREDNISONE 20 MG PO TABS: 40.0000 mg | ORAL_TABLET | Freq: Every day | ORAL | 0 refills | Status: DC

## 2022-09-15 MED ORDER — PROMETHAZINE-DM 6.25-15 MG/5ML PO SYRP: 5.0000 mL | ORAL_SOLUTION | Freq: Four times a day (QID) | ORAL | 0 refills | Status: DC | PRN

## 2022-09-15 MED ORDER — ALBUTEROL SULFATE HFA 108 (90 BASE) MCG/ACT IN AERS: 2.0000 | INHALATION_SPRAY | RESPIRATORY_TRACT | 0 refills | Status: DC | PRN

## 2022-09-15 MED ORDER — AZELASTINE HCL 0.1 % NA SOLN: 1.0000 | Freq: Two times a day (BID) | NASAL | 2 refills | Status: DC

## 2022-09-15 MED ORDER — IPRATROPIUM-ALBUTEROL 0.5-2.5 (3) MG/3ML IN SOLN
3.0000 mL | Freq: Once | RESPIRATORY_TRACT | Status: AC
  Administered 2022-09-15: 3 mL via RESPIRATORY_TRACT

## 2022-09-15 NOTE — ED Triage Notes (Signed)
Pt reports nasal mucus and cough x 2 weeks. Was seen at [facility] on 12/28 and was given prednisone '5mg'$  and 10 mg , levofloxacin, cefdinir, and amoxicillin but no relief.

## 2022-09-15 NOTE — ED Provider Notes (Signed)
[facility]    CSN: [id number] Arrival date & time: 09/15/22  1048      History   Chief Complaint Chief Complaint  Patient presents with   Cough    Entered by patient    HPI [name] is a 82 y.o. female.   Patient presenting today with 2-week history of nasal congestion, cough, wheezing, chest tightness, shortness of breath.  Has been seen twice at different urgent care since onset of symptoms, has been given 2 different doses of prednisone, Levaquin, cefdinir and amoxicillin per patient with no benefit.  Takes Advair daily for asthma and uses her albuterol about once a day since onset of symptoms, typically uses it much less frequently.  Also takes her Claritin daily for seasonal allergies.  No known sick contacts recently.    Past Medical History:  Diagnosis Date   Aortic atherosclerosis ([place])    Asthma    Breast CA ([place])    Cancer ([place])    right breast mastectomy   DDD (degenerative disc disease), cervical    Duodenal ulcer    External hemorrhoids    Generalized anxiety disorder    H/O chest pain 03/24/2006   normal nuclear  study   Headache    High cholesterol    Hypertension    Mild aortic insufficiency 03/28/2006   echo   Normal cardiac stress test 2005   Pancreatitis    Personal history of chemotherapy    Personal history of radiation therapy    PONV (postoperative nausea and vomiting)    Sinus bradycardia on ECG    Stroke [facility])     Patient Active Problem List   Diagnosis Date Noted   Bronchitis 05/25/2021   Asthma exacerbation 05/25/2021   No-show for appointment 01/28/2020   Angina pectoris ([place])    History of stroke 06/13/2017   DOE (dyspnea on exertion) 05/24/2017   Occipital neuralgia of right side 11/02/2016   Essential hypertension 09/23/2015   Palpitations 06/17/2015   Cerebrovascular accident (CVA) due to occlusion of left middle cerebral artery ([place]) 06/17/2015   Hyperlipidemia 06/17/2015   HLD (hyperlipidemia)     Intracranial vascular stenosis    Cerebral infarction due to occlusion of left middle cerebral artery (HCC)    CVA (cerebral vascular accident) ([place]) 04/27/2015   Stroke ([place]) 04/27/2015   Cephalalgia    Extrinsic asthma 07/02/2014   Migraine variant with status migrainosus 07/02/2014   Benign paroxysmal positional vertigo 07/02/2014   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/2014   Acute pancreatitis 03/03/2014   PUD (peptic ulcer disease) 03/03/2014   HTN (hypertension) 03/03/2014   Abdominal pain 03/03/2014   Nausea with vomiting 03/03/2014   Mild aortic insufficiency    H/O chest pain    Sinus bradycardia on ECG    G E R D 03/24/2008   ADENOCARCINOMA, BREAST, RIGHT 06/30/2007   ASTHMA 06/30/2007   Upper airway cough syndrome 06/30/2007    Past Surgical History:  Procedure Laterality Date   ABDOMINAL HYSTERECTOMY     APPENDECTOMY     "30 years ago"   childbirth     x 2   CHOLECYSTECTOMY     "15 years ago"   ESOPHAGOGASTRODUODENOSCOPY N/A 03/04/2014   Procedure: ESOPHAGOGASTRODUODENOSCOPY (EGD);  Surgeon: [name], MD;  Location: [facility] ENDOSCOPY;  Service: Endoscopy;  Laterality: N/A;   MASTECTOMY  1993   right breast   RIGHT/LEFT HEART CATH AND CORONARY ANGIOGRAPHY N/A 05/30/2019   Procedure: RIGHT/LEFT HEART CATH AND CORONARY ANGIOGRAPHY;  Surgeon: [name], MD;  Location: [place] CV LAB;  Service: Cardiovascular;  Laterality: N/A;   TUBAL LIGATION      OB History     Gravida  2   Para  2   Term      Preterm      AB      Living  2      SAB      IAB      Ectopic      Multiple      Live Births               Home Medications    Prior to Admission medications   Medication Sig Start Date End Date Taking? Authorizing Provider  albuterol (VENTOLIN HFA) 108 (90 Base) MCG/ACT inhaler Inhale 2 puffs into the lungs every 4 (four) hours as needed for wheezing or shortness of breath. 09/15/22  Yes Volney American, PA-C   azelastine (ASTELIN) 0.1 % nasal spray Place 1 spray into both nostrils 2 (two) times daily. Use in each nostril as directed 09/15/22  Yes Volney American, PA-C  predniSONE (DELTASONE) 20 MG tablet Take 2 tablets (40 mg total) by mouth daily with breakfast. 09/15/22  Yes Volney American, PA-C  promethazine-dextromethorphan (PROMETHAZINE-DM) 6.25-15 MG/5ML syrup Take 5 mLs by mouth 4 (four) times daily as needed. 09/15/22  Yes Volney American, PA-C  albuterol (VENTOLIN HFA) 108 (90 Base) MCG/ACT inhaler Inhale 1-2 puffs into the lungs every 4 (four) hours as needed for wheezing or shortness of breath. 05/29/21   Thurnell Lose, MD  amLODipine (NORVASC) 5 MG tablet Take 5 mg by mouth daily.    [provider]  atorvastatin (LIPITOR) 80 MG tablet Take 1 tablet (80 mg total) by mouth daily at 6 PM. 07/06/15   Rosalin Hawking, MD  azelastine (ASTELIN) 0.1 % nasal spray Place 1 spray into both nostrils 2 (two) times daily. Use in each nostril as directed 07/06/21   Martyn Ehrich, NP  clobetasol cream (TEMOVATE) 1.19 % Apply 1 application topically daily at 6 (six) AM. 12/23/20   [provider]  clonazePAM (KLONOPIN) 1 MG tablet Take 1 mg by mouth 2 (two) times daily as needed for anxiety. 08/24/20   [provider]  clopidogrel (PLAVIX) 75 MG tablet Take 1 tablet (75 mg total) by mouth daily. 06/01/17   Rosalin Hawking, MD  DULoxetine (CYMBALTA) 30 MG capsule Take 30 mg by mouth daily. 05/05/21   [provider]  Fluticasone-Salmeterol (ADVAIR) 250-50 MCG/DOSE AEPB Inhale 1 puff into the lungs 2 (two) times daily.    [provider]  isosorbide mononitrate (IMDUR) 30 MG 24 hr tablet TAKE 1 TABLET BY MOUTH DAILY 07/07/22   [name], Wonda Cheng, MD  loratadine (CLARITIN) 10 MG tablet Take 10 mg by mouth daily.    [provider]  metFORMIN (GLUCOPHAGE-XR) 500 MG 24 hr tablet Take 1 tablet by mouth in the morning, at noon, and at bedtime. 03/05/22    [provider]  montelukast (SINGULAIR) 10 MG tablet Take 1 tablet (10 mg total) by mouth at bedtime. 05/29/21   Thurnell Lose, MD  nitroGLYCERIN (NITROSTAT) 0.4 MG SL tablet Place 1 tablet (0.4 mg total) under the tongue every 5 (five) minutes as needed for chest pain. 05/07/19   [name], Wonda Cheng, MD  nortriptyline (PAMELOR) 10 MG capsule Take 2 capsules by mouth daily. 06/27/17   [provider]  ondansetron (ZOFRAN) 4 MG  tablet Take 1 tablet (4 mg total) by mouth every 8 (eight) hours as needed for nausea or vomiting. 02/01/22   Sharyn Creamer, MD  pantoprazole (PROTONIX) 40 MG tablet Take 1 tablet (40 mg total) by mouth daily. 04/07/22   Sharyn Creamer, MD    Family History Family History  Problem Relation Age of Onset   Heart attack Mother    Heart disease Mother    Hypertension Mother    Asthma Mother    Heart attack Father    Heart disease Father    Hypertension Sister    Heart attack Sister    Colon cancer Sister 84   Hypertension Sister    Heart attack Sister    Heart attack Sister    Lung disease Daughter    Thyroid disease Daughter     Social History Social History   Tobacco Use   Smoking status: Never   Smokeless tobacco: Never  Vaping Use   Vaping Use: Never used  Substance Use Topics   Alcohol use: No    Alcohol/week: 0.0 standard drinks of alcohol   Drug use: No     Allergies   Ciprofloxacin, Codeine, Doxycycline, Percocet [oxycodone-acetaminophen], and Tramadol   Review of Systems Review of Systems Per HPI  Physical Exam Triage Vital Signs ED Triage Vitals  Enc Vitals Group     BP 09/15/22 1113 (!) 179/98     Pulse Rate 09/15/22 1113 89     Resp 09/15/22 1113 20     Temp 09/15/22 1113 98.6 F (37 C)     Temp Source 09/15/22 1113 Oral     SpO2 09/15/22 1113 95 %     Weight --      Height --      Head Circumference --      Peak Flow --      Pain Score 09/15/22 1123 0     Pain Loc --      Pain Edu? --      Excl. in  [place]? --    No data found.  Updated Vital Signs BP (!) 179/98 (BP Location: Right Arm)   Pulse 89   Temp 98.6 F (37 C) (Oral)   Resp 20   SpO2 95%   Visual Acuity Right Eye Distance:   Left Eye Distance:   Bilateral Distance:    Right Eye Near:   Left Eye Near:    Bilateral Near:     Physical Exam Vitals and nursing note reviewed.  Constitutional:      Appearance: Normal appearance.  HENT:     Head: Atraumatic.     Right Ear: Tympanic membrane and external ear normal.     Left Ear: Tympanic membrane and external ear normal.     Nose: Rhinorrhea present.     Mouth/Throat:     Mouth: Mucous membranes are moist.     Pharynx: Posterior oropharyngeal erythema present.  Eyes:     Extraocular Movements: Extraocular movements intact.     Conjunctiva/sclera: Conjunctivae normal.  Cardiovascular:     Rate and Rhythm: Normal rate and regular rhythm.     Heart sounds: Normal heart sounds.  Pulmonary:     Effort: Pulmonary effort is normal.     Breath sounds: Wheezing present. No rales.  Musculoskeletal:        General: Normal range of motion.     Cervical back: Normal range of motion and neck supple.  Skin:    General: Skin is warm  and dry.  Neurological:     Mental Status: She is alert and oriented to person, place, and time.  Psychiatric:        Mood and Affect: Mood normal.        Thought Content: Thought content normal.      UC Treatments / Results  Labs (all labs ordered are listed, but only abnormal results are displayed) Labs Reviewed - No data to display  EKG   Radiology DG Chest 2 View  Result Date: 09/15/2022 CLINICAL DATA:  Productive cough for 2 weeks, chest tightness EXAM: CHEST - 2 VIEW COMPARISON:  10/18/2021 FINDINGS: Frontal and lateral views of the chest demonstrate an unremarkable cardiac silhouette. Chronic background scarring, without acute airspace disease, effusion, or pneumothorax. Postsurgical changes from right mastectomy and axillary  node dissection. No acute bony abnormalities. IMPRESSION: 1. No acute intrathoracic process. Electronically Signed   By: Randa Ngo M.D.   On: 09/15/2022 12:03    Procedures Procedures (including critical care time)  Medications Ordered in UC Medications  ipratropium-albuterol (DUONEB) 0.5-2.5 (3) MG/3ML nebulizer solution 3 mL (3 mLs Nebulization Given 09/15/22 1202)  dexamethasone (DECADRON) injection 10 mg (10 mg Intramuscular Given 09/15/22 1231)    Initial Impression / Assessment and Plan / UC Course  I have reviewed the triage vital signs and the nursing notes.  Pertinent labs & imaging results that were available during my care of the patient were reviewed by me and considered in my medical decision making (see chart for details).     Vital signs overall reassuring, mildly hypertensive in triage but otherwise vitals are within normal limits today.  Significant wheezes on exam, almost full resolution after DuoNeb treatment in clinic.  Chest x-ray today without evidence of pneumonia.  Suspect seasonal allergy and asthma exacerbation causing symptoms, particularly given no benefit with multiple rounds of antibiotics at this time.  Will treat with IM Decadron, prednisone burst, Phenergan DM, albuterol and discussed increasing the frequency of albuterol use until feeling much better.  Continue Advair daily as well as allergy regimen.  Return for worsening symptoms.  Final Clinical Impressions(s) / UC Diagnoses   Final diagnoses:  Seasonal allergic rhinitis due to other allergic trigger  Moderate persistent asthma with acute exacerbation   Discharge Instructions   None    ED Prescriptions     Medication Sig Dispense Auth. Provider   predniSONE (DELTASONE) 20 MG tablet Take 2 tablets (40 mg total) by mouth daily with breakfast. 10 tablet Volney American, PA-C   promethazine-dextromethorphan (PROMETHAZINE-DM) 6.25-15 MG/5ML syrup Take 5 mLs by mouth 4 (four) times daily as  needed. 100 mL Volney American, PA-C   albuterol (VENTOLIN HFA) 108 (90 Base) MCG/ACT inhaler Inhale 2 puffs into the lungs every 4 (four) hours as needed for wheezing or shortness of breath. 18 g Volney American, [place]   azelastine (ASTELIN) 0.1 % nasal spray Place 1 spray into both nostrils 2 (two) times daily. Use in each nostril as directed 30 mL Volney American, PA-C      PDMP not reviewed this encounter.   Volney American, [place] 09/15/22 1243

## 2022-09-20 ENCOUNTER — Ambulatory Visit
Admission: EM | Admit: 2022-09-20 | Discharge: 2022-09-20 | Disposition: A | Discharge status: Home / Self Care | Payer: PPO | Attending: Family Medicine | Admitting: Family Medicine

## 2022-09-20 DIAGNOSIS — J22 Unspecified acute lower respiratory infection: Secondary | ICD-10-CM | POA: Diagnosis not present

## 2022-09-20 DIAGNOSIS — J4541 Moderate persistent asthma with (acute) exacerbation: Secondary | ICD-10-CM | POA: Diagnosis not present

## 2022-09-20 MED ORDER — DEXAMETHASONE SODIUM PHOSPHATE 10 MG/ML IJ SOLN
10.0000 mg | Freq: Once | INTRAMUSCULAR | Status: AC
  Administered 2022-09-20: 10 mg via INTRAMUSCULAR

## 2022-09-20 MED ORDER — AZITHROMYCIN 250 MG PO TABS: ORAL_TABLET | ORAL | 0 refills | Status: DC

## 2022-09-20 MED ORDER — AMOXICILLIN-POT CLAVULANATE 875-125 MG PO TABS: 1.0000 | ORAL_TABLET | Freq: Two times a day (BID) | ORAL | 0 refills | Status: DC

## 2022-09-20 MED ORDER — GUAIFENESIN ER 600 MG PO TB12: 600.0000 mg | ORAL_TABLET | Freq: Two times a day (BID) | ORAL | 0 refills | Status: DC | PRN

## 2022-09-20 NOTE — ED Provider Notes (Signed)
[facility]    CSN: [id number] Arrival date & time: 09/20/22  1208      History   Chief Complaint Chief Complaint  Patient presents with   Shortness of Breath    HPI [name] is a 82 y.o. female.   Presenting today following up on recent visit 09/15/2022 for asthma exacerbation.  States the steroid shot did help some but the prednisone did not seem to help her.  Has not been treated with multiple rounds of antibiotics, steroids and still having significant difficulty breathing, wheezing, chest tightness, shortness of breath, productive cough and now significant fatigue, weakness.  Denies known fever, chest pain, abdominal pain, nausea vomiting or diarrhea.  Consistent with her Advair, albuterol and has been taking some breathing treatments as well which only provide her about 20 minutes of relief.   Past Medical History:  Diagnosis Date   Aortic atherosclerosis ([place])    Asthma    Breast CA ([place])    Cancer ([place])    right breast mastectomy   DDD (degenerative disc disease), cervical    Duodenal ulcer    External hemorrhoids    Generalized anxiety disorder    H/O chest pain 03/24/2006   normal nuclear  study   Headache    High cholesterol    Hypertension    Mild aortic insufficiency 03/28/2006   echo   Normal cardiac stress test 2005   Pancreatitis    Personal history of chemotherapy    Personal history of radiation therapy    PONV (postoperative nausea and vomiting)    Sinus bradycardia on ECG    Stroke [facility])     Patient Active Problem List   Diagnosis Date Noted   Bronchitis 05/25/2021   Asthma exacerbation 05/25/2021   No-show for appointment 01/28/2020   Angina pectoris ([place])    History of stroke 06/13/2017   DOE (dyspnea on exertion) 05/24/2017   Occipital neuralgia of right side 11/02/2016   Essential hypertension 09/23/2015   Palpitations 06/17/2015   Cerebrovascular accident (CVA) due to occlusion of left middle cerebral artery ([place])  06/17/2015   Hyperlipidemia 06/17/2015   HLD (hyperlipidemia)    Intracranial vascular stenosis    Cerebral infarction due to occlusion of left middle cerebral artery (HCC)    CVA (cerebral vascular accident) ([place]) 04/27/2015   Stroke ([place]) 04/27/2015   Cephalalgia    Extrinsic asthma 07/02/2014   Migraine variant with status migrainosus 07/02/2014   Benign paroxysmal positional vertigo 07/02/2014   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/2014   Acute pancreatitis 03/03/2014   PUD (peptic ulcer disease) 03/03/2014   HTN (hypertension) 03/03/2014   Abdominal pain 03/03/2014   Nausea with vomiting 03/03/2014   Mild aortic insufficiency    H/O chest pain    Sinus bradycardia on ECG    G E R D 03/24/2008   ADENOCARCINOMA, BREAST, RIGHT 06/30/2007   ASTHMA 06/30/2007   Upper airway cough syndrome 06/30/2007    Past Surgical History:  Procedure Laterality Date   ABDOMINAL HYSTERECTOMY     APPENDECTOMY     "30 years ago"   childbirth     x 2   CHOLECYSTECTOMY     "15 years ago"   ESOPHAGOGASTRODUODENOSCOPY N/A 03/04/2014   Procedure: ESOPHAGOGASTRODUODENOSCOPY (EGD);  Surgeon: [name], MD;  Location[facility] ENDOSCOPY;  Service: Endoscopy;  Laterality: N/A;   MASTECTOMY  1993   right breast   RIGHT/LEFT HEART CATH AND CORONARY ANGIOGRAPHY N/A 05/30/2019   Procedure: RIGHT/LEFT HEART CATH  AND CORONARY ANGIOGRAPHY;  Surgeon: [name], MD;  Location: [place] CV LAB;  Service: Cardiovascular;  Laterality: N/A;   TUBAL LIGATION      OB History     Gravida  2   Para  2   Term      Preterm      AB      Living  2      SAB      IAB      Ectopic      Multiple      Live Births               Home Medications    Prior to Admission medications   Medication Sig Start Date End Date Taking? Authorizing Provider  amoxicillin-clavulanate (AUGMENTIN) 875-125 MG tablet Take 1 tablet by mouth every 12 (twelve) hours. 09/20/22  Yes Volney American, PA-C  azithromycin (ZITHROMAX) 250 MG tablet Take first 2 tablets together, then 1 every day until finished. 09/20/22  Yes Volney American, PA-C  guaiFENesin (MUCINEX) 600 MG 12 hr tablet Take 1 tablet (600 mg total) by mouth 2 (two) times daily as needed. 09/20/22  Yes Volney American, PA-C  albuterol (VENTOLIN HFA) 108 (90 Base) MCG/ACT inhaler Inhale 1-2 puffs into the lungs every 4 (four) hours as needed for wheezing or shortness of breath. 05/29/21   Thurnell Lose, MD  albuterol (VENTOLIN HFA) 108 (90 Base) MCG/ACT inhaler Inhale 2 puffs into the lungs every 4 (four) hours as needed for wheezing or shortness of breath. 09/15/22   Volney American, PA-C  amLODipine (NORVASC) 5 MG tablet Take 5 mg by mouth daily.    [provider]  atorvastatin (LIPITOR) 80 MG tablet Take 1 tablet (80 mg total) by mouth daily at 6 PM. 07/06/15   Rosalin Hawking, MD  azelastine (ASTELIN) 0.1 % nasal spray Place 1 spray into both nostrils 2 (two) times daily. Use in each nostril as directed 07/06/21   Martyn Ehrich, NP  azelastine (ASTELIN) 0.1 % nasal spray Place 1 spray into both nostrils 2 (two) times daily. Use in each nostril as directed 09/15/22   Volney American, PA-C  clobetasol cream (TEMOVATE) 9.52 % Apply 1 application topically daily at 6 (six) AM. 12/23/20   [provider]  clonazePAM (KLONOPIN) 1 MG tablet Take 1 mg by mouth 2 (two) times daily as needed for anxiety. 08/24/20   [provider]  clopidogrel (PLAVIX) 75 MG tablet Take 1 tablet (75 mg total) by mouth daily. 06/01/17   Rosalin Hawking, MD  DULoxetine (CYMBALTA) 30 MG capsule Take 30 mg by mouth daily. 05/05/21   [provider]  Fluticasone-Salmeterol (ADVAIR) 250-50 MCG/DOSE AEPB Inhale 1 puff into the lungs 2 (two) times daily.    [provider]  isosorbide mononitrate (IMDUR) 30 MG 24 hr tablet TAKE 1 TABLET BY MOUTH DAILY 07/07/22   [name], Wonda Cheng, MD   loratadine (CLARITIN) 10 MG tablet Take 10 mg by mouth daily.    [provider]  metFORMIN (GLUCOPHAGE-XR) 500 MG 24 hr tablet Take 1 tablet by mouth in the morning, at noon, and at bedtime. 03/05/22   [provider]  montelukast (SINGULAIR) 10 MG tablet Take 1 tablet (10 mg total) by mouth at bedtime. 05/29/21   Thurnell Lose, MD  nitroGLYCERIN (NITROSTAT) 0.4 MG SL tablet Place 1 tablet (0.4 mg total) under the tongue every 5 (five) minutes as needed for chest  pain. 05/07/19   [name], Wonda Cheng, MD  nortriptyline (PAMELOR) 10 MG capsule Take 2 capsules by mouth daily. 06/27/17   [provider]  ondansetron (ZOFRAN) 4 MG tablet Take 1 tablet (4 mg total) by mouth every 8 (eight) hours as needed for nausea or vomiting. 02/01/22   Sharyn Creamer, MD  pantoprazole (PROTONIX) 40 MG tablet Take 1 tablet (40 mg total) by mouth daily. 04/07/22   Sharyn Creamer, MD  predniSONE (DELTASONE) 20 MG tablet Take 2 tablets (40 mg total) by mouth daily with breakfast. 09/15/22   Volney American, PA-C  promethazine-dextromethorphan (PROMETHAZINE-DM) 6.25-15 MG/5ML syrup Take 5 mLs by mouth 4 (four) times daily as needed. 09/15/22   Volney American, PA-C    Family History Family History  Problem Relation Age of Onset   Heart attack Mother    Heart disease Mother    Hypertension Mother    Asthma Mother    Heart attack Father    Heart disease Father    Hypertension Sister    Heart attack Sister    Colon cancer Sister 4   Hypertension Sister    Heart attack Sister    Heart attack Sister    Lung disease Daughter    Thyroid disease Daughter     Social History Social History   Tobacco Use   Smoking status: Never   Smokeless tobacco: Never  Vaping Use   Vaping Use: Never used  Substance Use Topics   Alcohol use: No    Alcohol/week: 0.0 standard drinks of alcohol   Drug use: No     Allergies   Ciprofloxacin, Codeine, Doxycycline, Percocet  [oxycodone-acetaminophen], and Tramadol   Review of Systems Review of Systems PER HPI  Physical Exam Triage Vital Signs ED Triage Vitals  Enc Vitals Group     BP 09/20/22 1300 (!) 174/101     Pulse Rate 09/20/22 1300 86     Resp 09/20/22 1300 20     Temp 09/20/22 1300 98.1 F (36.7 C)     Temp Source 09/20/22 1300 Oral     SpO2 09/20/22 1300 95 %     Weight --      Height --      Head Circumference --      Peak Flow --      Pain Score 09/20/22 1303 0     Pain Loc --      Pain Edu? --      Excl. in [place]? --    No data found.  Updated Vital Signs BP (!) 174/101 (BP Location: Right Arm)   Pulse 86   Temp 98.1 F (36.7 C) (Oral)   Resp 20   SpO2 95%   Visual Acuity Right Eye Distance:   Left Eye Distance:   Bilateral Distance:    Right Eye Near:   Left Eye Near:    Bilateral Near:     Physical Exam Vitals and nursing note reviewed.  Constitutional:      Appearance: Normal appearance.  HENT:     Head: Atraumatic.     Right Ear: Tympanic membrane and external ear normal.     Left Ear: Tympanic membrane and external ear normal.     Nose: Congestion present.     Mouth/Throat:     Mouth: Mucous membranes are moist.     Pharynx: Posterior oropharyngeal erythema present.  Eyes:     Extraocular Movements: Extraocular movements intact.     Conjunctiva/sclera: Conjunctivae normal.  Cardiovascular:     Rate and Rhythm: Normal rate and regular rhythm.     Heart sounds: Normal heart sounds.  Pulmonary:     Effort: Pulmonary effort is normal.     Breath sounds: Wheezing present.     Comments: Significant wheezes bilaterally Musculoskeletal:        General: Normal range of motion.     Cervical back: Normal range of motion and neck supple.  Skin:    General: Skin is warm and dry.  Neurological:     Mental Status: She is alert and oriented to person, place, and time.  Psychiatric:        Mood and Affect: Mood normal.        Thought Content: Thought content  normal.      UC Treatments / Results  Labs (all labs ordered are listed, but only abnormal results are displayed) Labs Reviewed - No data to display  EKG   Radiology No results found.  Procedures Procedures (including critical care time)  Medications Ordered in UC Medications  dexamethasone (DECADRON) injection 10 mg (10 mg Intramuscular Given 09/20/22 1347)    Initial Impression / Assessment and Plan / UC Course  I have reviewed the triage vital signs and the nursing notes.  Pertinent labs & imaging results that were available during my care of the patient were reviewed by me and considered in my medical decision making (see chart for details).     Given persistent of symptoms, will add azithromycin, Augmentin and give 1 more IM Decadron shot as this did provide relief previously.  Continue Advair, albuterol, breathing treatments, Mucinex and follow-up with pulmonology as soon as able.  Final Clinical Impressions(s) / UC Diagnoses   Final diagnoses:  Lower respiratory infection  Moderate persistent asthma with acute exacerbation   Discharge Instructions   None    ED Prescriptions     Medication Sig Dispense Auth. Provider   azithromycin (ZITHROMAX) 250 MG tablet Take first 2 tablets together, then 1 every day until finished. 6 tablet Volney American, PA-C   amoxicillin-clavulanate (AUGMENTIN) 875-125 MG tablet Take 1 tablet by mouth every 12 (twelve) hours. 14 tablet Volney American, [place]   guaiFENesin (MUCINEX) 600 MG 12 hr tablet Take 1 tablet (600 mg total) by mouth 2 (two) times daily as needed. 20 tablet Volney American, [place]      PDMP not reviewed this encounter.   Volney American, [place] 09/20/22 1410

## 2022-09-20 NOTE — ED Triage Notes (Signed)
Pt report some SOB, coughing, no energy since 12/28. Says the prednisone along with other meds did not help her symptoms since her last vist 1/4.

## 2022-09-21 ENCOUNTER — Ambulatory Visit: Payer: Self-pay

## 2022-09-22 ENCOUNTER — Telehealth: Payer: Self-pay | Admitting: Internal Medicine

## 2022-09-22 DIAGNOSIS — R059 Cough, unspecified: Secondary | ICD-10-CM | POA: Diagnosis not present

## 2022-09-22 DIAGNOSIS — Z20822 Contact with and (suspected) exposure to covid-19: Secondary | ICD-10-CM | POA: Diagnosis not present

## 2022-09-22 NOTE — Telephone Encounter (Signed)
Called and spoke with pt and have scheduled her an appt with CY.nothing further needed.

## 2022-09-22 NOTE — Telephone Encounter (Signed)
Seems like pt was a former pt of Dr. Annamaria Boots last seen by him 08/27/2019.  Dr. Annamaria Boots, please advise if you would be okay taking back over pt's care.

## 2022-09-22 NOTE — Telephone Encounter (Signed)
Ok with me 

## 2022-09-23 ENCOUNTER — Ambulatory Visit (INDEPENDENT_AMBULATORY_CARE_PROVIDER_SITE_OTHER): Payer: PPO | Admitting: Internal Medicine

## 2022-09-23 ENCOUNTER — Encounter: Payer: Self-pay | Admitting: Internal Medicine

## 2022-09-23 VITALS — BP 130/80 | HR 86 | Ht 65.0 in | Wt 152.0 lb

## 2022-09-23 DIAGNOSIS — J40 Bronchitis, not specified as acute or chronic: Secondary | ICD-10-CM | POA: Diagnosis not present

## 2022-09-23 DIAGNOSIS — R0609 Other forms of dyspnea: Secondary | ICD-10-CM | POA: Diagnosis not present

## 2022-09-23 MED ORDER — BENZONATATE 200 MG PO CAPS: 200.0000 mg | ORAL_CAPSULE | Freq: Three times a day (TID) | ORAL | 1 refills | Status: DC | PRN

## 2022-09-23 NOTE — Progress Notes (Signed)
09/23/22- 43 yoF never smoker, former patient of mine Pt states she currently has flu A she is here due to coughing. She was seen at [facility] yesterday they have called her in medication this morning. Medical problem list includes HTN, Migraine, hx CVA, Angina, Aortic Insufficiency, Asthma, Bronchitis, hx R Breast Cancer,  - Prometh DM, albuterol hfa, Singulair, Advair 250, augmentin Recent Zpak, prednisone,  Had three UC visits for cough, blowing nose, yellow, no fever, tired. Had some diarrhea.  Too late for Tamiflu. Now on augmentin and had several rounds of prednisone. CXR 09/15/22-  IMPRESSION: 1. No acute intrathoracic process.  ROS-see HPI   + = positive Constitutional:    weight loss, night sweats, fevers, chills, fatigue, lassitude. HEENT:    headaches, difficulty swallowing, tooth/dental problems, sore throat,       sneezing, itching, ear ache, nasal congestion, post nasal drip, snoring CV:    chest pain, orthopnea, PND, swelling in lower extremities, anasarca,                                  dizziness, palpitations Resp:   shortness of breath with exertion or at rest.                productive cough,   non-productive cough, coughing up of blood.              change in color of mucus.  wheezing.   Skin:    rash or lesions. GI:  No-   heartburn, indigestion, abdominal pain, nausea, vomiting, diarrhea,                 change in bowel habits, loss of appetite GU: dysuria, change in color of urine, no urgency or frequency.   flank pain. MS:   joint pain, stiffness, decreased range of motion, back pain. Neuro-     nothing unusual Psych:  change in mood or affect.  depression or anxiety.   memory loss.  OBJ- Physical Exam General- Alert, Oriented, Affect-appropriate, Distress- none acute Skin- rash-none, lesions- none, excoriation- none Lymphadenopathy- none Head- atraumatic            Eyes- Gross vision intact, PERRLA, conjunctivae and secretions clear            Ears- Hearing,  canals-normal            Nose- Clear, no-Septal dev, mucus, polyps, erosion, perforation             Throat- Mallampati II , mucosa clear , drainage- none, tonsils- atrophic Neck- flexible , trachea midline, no stridor , thyroid nl, carotid no bruit Chest - symmetrical excursion , unlabored           Heart/CV- RRR , no murmur , no gallop  , no rub, nl s1 s2                           - JVD- none , edema- none, stasis changes- none, varices- none           Lung- +light rattle, wheeze- none, cough- none , dullness-none, rub- none           Chest wall-  Abd-  Br/ Gen/ Rectal- Not done, not indicated Extrem- cyanosis- none, clubbing, none, atrophy- none, strength- nl Neuro- grossly intact to observation

## 2022-09-23 NOTE — Patient Instructions (Signed)
Finish the augmentin, since it may have been helping some  Script sent for benzonatate perles for cough. You can also use otc cough med like Delsym if needed  Fluids and rest and try not to spread the flu around if you can help it.  Please call as needed

## 2022-09-26 DIAGNOSIS — S83232D Complex tear of medial meniscus, current injury, left knee, subsequent encounter: Secondary | ICD-10-CM | POA: Diagnosis not present

## 2022-09-28 DIAGNOSIS — J111 Influenza due to unidentified influenza virus with other respiratory manifestations: Secondary | ICD-10-CM | POA: Diagnosis not present

## 2022-09-28 DIAGNOSIS — J45909 Unspecified asthma, uncomplicated: Secondary | ICD-10-CM | POA: Diagnosis not present

## 2022-09-28 DIAGNOSIS — R531 Weakness: Secondary | ICD-10-CM | POA: Diagnosis not present

## 2022-09-28 DIAGNOSIS — I10 Essential (primary) hypertension: Secondary | ICD-10-CM | POA: Diagnosis not present

## 2022-09-28 DIAGNOSIS — R197 Diarrhea, unspecified: Secondary | ICD-10-CM | POA: Diagnosis not present

## 2022-09-30 ENCOUNTER — Encounter (HOSPITAL_BASED_OUTPATIENT_CLINIC_OR_DEPARTMENT_OTHER): Payer: Self-pay

## 2022-09-30 ENCOUNTER — Other Ambulatory Visit: Payer: Self-pay

## 2022-09-30 ENCOUNTER — Emergency Department (HOSPITAL_BASED_OUTPATIENT_CLINIC_OR_DEPARTMENT_OTHER)
Admission: EM | Admit: 2022-09-30 | Discharge: 2022-09-30 | Disposition: A | Discharge status: Home / Self Care | Payer: PPO | Source: Home / Self Care | Attending: Emergency Medicine | Admitting: Emergency Medicine

## 2022-09-30 ENCOUNTER — Emergency Department (HOSPITAL_BASED_OUTPATIENT_CLINIC_OR_DEPARTMENT_OTHER): Payer: PPO

## 2022-09-30 ENCOUNTER — Emergency Department (HOSPITAL_BASED_OUTPATIENT_CLINIC_OR_DEPARTMENT_OTHER): Payer: PPO | Admitting: Radiology

## 2022-09-30 DIAGNOSIS — R519 Headache, unspecified: Secondary | ICD-10-CM | POA: Diagnosis not present

## 2022-09-30 DIAGNOSIS — E872 Acidosis, unspecified: Secondary | ICD-10-CM | POA: Diagnosis present

## 2022-09-30 DIAGNOSIS — Z7984 Long term (current) use of oral hypoglycemic drugs: Secondary | ICD-10-CM | POA: Diagnosis not present

## 2022-09-30 DIAGNOSIS — T364X5A Adverse effect of tetracyclines, initial encounter: Secondary | ICD-10-CM | POA: Diagnosis present

## 2022-09-30 DIAGNOSIS — Z8673 Personal history of transient ischemic attack (TIA), and cerebral infarction without residual deficits: Secondary | ICD-10-CM | POA: Diagnosis not present

## 2022-09-30 DIAGNOSIS — Z9011 Acquired absence of right breast and nipple: Secondary | ICD-10-CM | POA: Diagnosis not present

## 2022-09-30 DIAGNOSIS — E782 Mixed hyperlipidemia: Secondary | ICD-10-CM | POA: Diagnosis present

## 2022-09-30 DIAGNOSIS — Z1152 Encounter for screening for COVID-19: Secondary | ICD-10-CM | POA: Insufficient documentation

## 2022-09-30 DIAGNOSIS — B974 Respiratory syncytial virus as the cause of diseases classified elsewhere: Secondary | ICD-10-CM | POA: Diagnosis not present

## 2022-09-30 DIAGNOSIS — R000 Tachycardia, unspecified: Secondary | ICD-10-CM | POA: Insufficient documentation

## 2022-09-30 DIAGNOSIS — Z9221 Personal history of antineoplastic chemotherapy: Secondary | ICD-10-CM | POA: Diagnosis not present

## 2022-09-30 DIAGNOSIS — E1165 Type 2 diabetes mellitus with hyperglycemia: Secondary | ICD-10-CM | POA: Diagnosis present

## 2022-09-30 DIAGNOSIS — J189 Pneumonia, unspecified organism: Secondary | ICD-10-CM | POA: Insufficient documentation

## 2022-09-30 DIAGNOSIS — M79671 Pain in right foot: Secondary | ICD-10-CM | POA: Diagnosis not present

## 2022-09-30 DIAGNOSIS — E871 Hypo-osmolality and hyponatremia: Secondary | ICD-10-CM | POA: Diagnosis not present

## 2022-09-30 DIAGNOSIS — E860 Dehydration: Secondary | ICD-10-CM

## 2022-09-30 DIAGNOSIS — Y929 Unspecified place or not applicable: Secondary | ICD-10-CM | POA: Diagnosis not present

## 2022-09-30 DIAGNOSIS — I700 Atherosclerosis of aorta: Secondary | ICD-10-CM | POA: Diagnosis not present

## 2022-09-30 DIAGNOSIS — F411 Generalized anxiety disorder: Secondary | ICD-10-CM | POA: Diagnosis present

## 2022-09-30 DIAGNOSIS — E119 Type 2 diabetes mellitus without complications: Secondary | ICD-10-CM | POA: Insufficient documentation

## 2022-09-30 DIAGNOSIS — I110 Hypertensive heart disease with heart failure: Secondary | ICD-10-CM | POA: Diagnosis not present

## 2022-09-30 DIAGNOSIS — E1169 Type 2 diabetes mellitus with other specified complication: Secondary | ICD-10-CM | POA: Diagnosis not present

## 2022-09-30 DIAGNOSIS — Z8249 Family history of ischemic heart disease and other diseases of the circulatory system: Secondary | ICD-10-CM | POA: Diagnosis not present

## 2022-09-30 DIAGNOSIS — K219 Gastro-esophageal reflux disease without esophagitis: Secondary | ICD-10-CM | POA: Diagnosis present

## 2022-09-30 DIAGNOSIS — R531 Weakness: Secondary | ICD-10-CM | POA: Diagnosis not present

## 2022-09-30 DIAGNOSIS — Z853 Personal history of malignant neoplasm of breast: Secondary | ICD-10-CM | POA: Diagnosis not present

## 2022-09-30 DIAGNOSIS — Z794 Long term (current) use of insulin: Secondary | ICD-10-CM | POA: Diagnosis not present

## 2022-09-30 DIAGNOSIS — T3695XA Adverse effect of unspecified systemic antibiotic, initial encounter: Secondary | ICD-10-CM | POA: Diagnosis not present

## 2022-09-30 DIAGNOSIS — I10 Essential (primary) hypertension: Secondary | ICD-10-CM | POA: Diagnosis not present

## 2022-09-30 DIAGNOSIS — S92511A Displaced fracture of proximal phalanx of right lesser toe(s), initial encounter for closed fracture: Secondary | ICD-10-CM | POA: Diagnosis not present

## 2022-09-30 DIAGNOSIS — R0602 Shortness of breath: Secondary | ICD-10-CM | POA: Diagnosis not present

## 2022-09-30 DIAGNOSIS — J9601 Acute respiratory failure with hypoxia: Secondary | ICD-10-CM | POA: Diagnosis not present

## 2022-09-30 DIAGNOSIS — R9389 Abnormal findings on diagnostic imaging of other specified body structures: Secondary | ICD-10-CM | POA: Diagnosis not present

## 2022-09-30 DIAGNOSIS — Z923 Personal history of irradiation: Secondary | ICD-10-CM | POA: Diagnosis not present

## 2022-09-30 DIAGNOSIS — I5032 Chronic diastolic (congestive) heart failure: Secondary | ICD-10-CM | POA: Diagnosis present

## 2022-09-30 DIAGNOSIS — J45909 Unspecified asthma, uncomplicated: Secondary | ICD-10-CM | POA: Diagnosis present

## 2022-09-30 DIAGNOSIS — Z825 Family history of asthma and other chronic lower respiratory diseases: Secondary | ICD-10-CM | POA: Diagnosis not present

## 2022-09-30 LAB — URINALYSIS, ROUTINE W REFLEX MICROSCOPIC
Bacteria, UA: NONE SEEN
Bilirubin Urine: NEGATIVE
Glucose, UA: >1000 mg/dL — AB
Hgb urine dipstick: NEGATIVE
Ketones, ur: 15 mg/dL — AB
Leukocytes,Ua: NEGATIVE
Nitrite: NEGATIVE
Protein, ur: NEGATIVE mg/dL
Specific Gravity, Urine: 1.021 (ref 1.005–1.030)
pH: 6.5 (ref 5.0–8.0)

## 2022-09-30 LAB — I-STAT VENOUS BLOOD GAS, ED
Acid-Base Excess: 2 mmol/L (ref 0.0–2.0)
Bicarbonate: 25.1 mmol/L (ref 20.0–28.0)
Calcium, Ion: 1.19 mmol/L (ref 1.15–1.40)
HCT: 42 % (ref 36.0–46.0)
Hemoglobin: 14.3 g/dL (ref 12.0–15.0)
O2 Saturation: 72 %
Potassium: 4.3 mmol/L (ref 3.5–5.1)
Sodium: 134 mmol/L — ABNORMAL LOW (ref 135–145)
TCO2: 26 mmol/L (ref 22–32)
pCO2, Ven: 35.4 mmHg — ABNORMAL LOW (ref 44–60)
pH, Ven: 7.46 — ABNORMAL HIGH (ref 7.25–7.43)
pO2, Ven: 35 mmHg (ref 32–45)

## 2022-09-30 LAB — TROPONIN I (HIGH SENSITIVITY)
Troponin I (High Sensitivity): 12 ng/L (ref <18)
Troponin I (High Sensitivity): 12 ng/L (ref <18)

## 2022-09-30 LAB — LACTIC ACID, PLASMA: Lactic Acid, Venous: 1.4 mmol/L (ref 0.5–1.9)

## 2022-09-30 LAB — LIPASE, BLOOD: Lipase: 23 U/L (ref 11–51)

## 2022-09-30 LAB — BASIC METABOLIC PANEL
Anion gap: 12 (ref 5–15)
Anion gap: 12 (ref 5–15)
BUN: 27 mg/dL — ABNORMAL HIGH (ref 8–23)
BUN: 23 mg/dL (ref 8–23)
CO2: 24 mmol/L (ref 22–32)
CO2: 27 mmol/L (ref 22–32)
Calcium: 9.8 mg/dL (ref 8.9–10.3)
Calcium: 9.2 mg/dL (ref 8.9–10.3)
Chloride: 98 mmol/L (ref 98–111)
Chloride: 98 mmol/L (ref 98–111)
Creatinine, Ser: 1.21 mg/dL — ABNORMAL HIGH (ref 0.44–1.00)
Creatinine, Ser: 0.93 mg/dL (ref 0.44–1.00)
GFR, Estimated: 45 mL/min — ABNORMAL LOW (ref >60)
GFR, Estimated: >60 mL/min (ref >60)
Glucose, Bld: 310 mg/dL — ABNORMAL HIGH (ref 70–99)
Glucose, Bld: 174 mg/dL — ABNORMAL HIGH (ref 70–99)
Potassium: 4.5 mmol/L (ref 3.5–5.1)
Potassium: 3.8 mmol/L (ref 3.5–5.1)
Sodium: 134 mmol/L — ABNORMAL LOW (ref 135–145)
Sodium: 137 mmol/L (ref 135–145)

## 2022-09-30 LAB — HEPATIC FUNCTION PANEL
ALT: 26 U/L (ref 0–44)
AST: 14 U/L — ABNORMAL LOW (ref 15–41)
Albumin: 4.2 g/dL (ref 3.5–5.0)
Alkaline Phosphatase: 78 U/L (ref 38–126)
Bilirubin, Direct: 0.1 mg/dL (ref 0.0–0.2)
Indirect Bilirubin: 0.7 mg/dL (ref 0.3–0.9)
Total Bilirubin: 0.8 mg/dL (ref 0.3–1.2)
Total Protein: 7.6 g/dL (ref 6.5–8.1)

## 2022-09-30 LAB — CBC
HCT: 43.2 % (ref 36.0–46.0)
Hemoglobin: 15.3 g/dL — ABNORMAL HIGH (ref 12.0–15.0)
MCH: 31.1 pg (ref 26.0–34.0)
MCHC: 35.4 g/dL (ref 30.0–36.0)
MCV: 87.8 fL (ref 80.0–100.0)
Platelets: 244 10*3/uL (ref 150–400)
RBC: 4.92 MIL/uL (ref 3.87–5.11)
RDW: 12.6 % (ref 11.5–15.5)
WBC: 11.8 10*3/uL — ABNORMAL HIGH (ref 4.0–10.5)
nRBC: 0 % (ref 0.0–0.2)

## 2022-09-30 LAB — RESP PANEL BY RT-PCR (RSV, FLU A&B, COVID)  RVPGX2
Influenza A by PCR: NEGATIVE
Influenza B by PCR: NEGATIVE
Resp Syncytial Virus by PCR: NEGATIVE
SARS Coronavirus 2 by RT PCR: NEGATIVE

## 2022-09-30 LAB — BRAIN NATRIURETIC PEPTIDE: B Natriuretic Peptide: 61.4 pg/mL (ref 0.0–100.0)

## 2022-09-30 LAB — CBG MONITORING, ED: Glucose-Capillary: 322 mg/dL — ABNORMAL HIGH (ref 70–99)

## 2022-09-30 MED ORDER — LACTATED RINGERS IV BOLUS
1000.0000 mL | Freq: Once | INTRAVENOUS | Status: AC
  Administered 2022-09-30: 1000 mL via INTRAVENOUS

## 2022-09-30 MED ORDER — IPRATROPIUM-ALBUTEROL 0.5-2.5 (3) MG/3ML IN SOLN
3.0000 mL | Freq: Once | RESPIRATORY_TRACT | Status: AC
  Administered 2022-09-30: 3 mL via RESPIRATORY_TRACT
  Filled 2022-09-30: qty 3

## 2022-09-30 MED ORDER — METFORMIN HCL 500 MG PO TABS: 500.0000 mg | ORAL_TABLET | Freq: Once | ORAL | Status: DC

## 2022-09-30 MED ORDER — DOXYCYCLINE HYCLATE 100 MG PO TABS
100.0000 mg | ORAL_TABLET | Freq: Once | ORAL | Status: AC
  Administered 2022-09-30: 100 mg via ORAL
  Filled 2022-09-30: qty 1

## 2022-09-30 MED ORDER — METFORMIN HCL 500 MG PO TABS: 500.0000 mg | ORAL_TABLET | Freq: Two times a day (BID) | ORAL | 0 refills | Status: DC

## 2022-09-30 MED ORDER — LACTATED RINGERS IV BOLUS
500.0000 mL | Freq: Once | INTRAVENOUS | Status: AC
  Administered 2022-09-30: 500 mL via INTRAVENOUS

## 2022-09-30 MED ORDER — IOHEXOL 350 MG/ML SOLN
100.0000 mL | Freq: Once | INTRAVENOUS | Status: AC | PRN
  Administered 2022-09-30: 75 mL via INTRAVENOUS

## 2022-09-30 MED ORDER — BENZONATATE 200 MG PO CAPS: 200.0000 mg | ORAL_CAPSULE | Freq: Three times a day (TID) | ORAL | 0 refills | Status: DC | PRN

## 2022-09-30 MED ORDER — DOXYCYCLINE HYCLATE 100 MG PO CAPS: 100.0000 mg | ORAL_CAPSULE | Freq: Two times a day (BID) | ORAL | 0 refills | Status: DC

## 2022-09-30 NOTE — ED Triage Notes (Signed)
Patient here POV from Home.  Endorses feeling Fatigue that began when the Patient awoke this AM. Has been having a Dry Cough recently. Some Right Sided lateral Torso pain. Some SOB, No fevers.   Also endorses recent Hyperglycemia at 300-400 BG due to Steroid use.   NAD noted during Triage. A&Ox4. GCS 15. Ambulatory.

## 2022-09-30 NOTE — ED Notes (Signed)
RT educated pt on inhalation medication and reason for administration. Pt verbalizes understanding.

## 2022-09-30 NOTE — ED Notes (Signed)
Patient transported to CT 

## 2022-09-30 NOTE — ED Notes (Signed)
VBG completed while patient on room air

## 2022-09-30 NOTE — Discharge Instructions (Addendum)
1.  You were dehydrated when you came to the emergency department.  You have had IV fluids and your labs and general condition improved.  Try to hydrate at home by keeping fluids that contain electrolytes nearby and frequently taking small sips. 2.  Your CT scan of the chest does not show any evidence of any blood clots.  Your heart labs do not show evidence of a heart attack.  The CT scan of your chest does show some residual areas suspicious for pneumonia.  At this time, your oxygen level is good and you are not having any difficulty breathing.  You are being started on an antibiotic called doxycycline.  Take this twice daily. 3.  Use your albuterol inhaler 2 puffs every 4-6 hours for wheezing and coughing.  You may take Tessalon Perles as prescribed for cough as well.  Follow them whole.  Do not chew them or suck on them. 4.  Return to the emergency department immediately if you are feeling weak, develop chest pain, fevers or returning or other concerning changes to suggest you are getting worse again. 5.  We extensively discussed your diabetes management.  You reported you wish to go back on metformin as you are diabetic agent.  Because you had IV contrast in the emergency department, you cannot restart the metformin for 48 hours after your IV contrast.  Take your glipizide and Jardiance tomorrow as prescribed. medications.  Talk to doctor at the beginning of the week regarding your diabetes management.

## 2022-09-30 NOTE — ED Notes (Signed)
CBG: 322

## 2022-09-30 NOTE — ED Provider Notes (Signed)
[facility] Provider Note   CSN: [id number] Arrival date & time: 09/30/22  1249     History  Chief Complaint  Patient presents with   Shortness of Breath    [name] is a 82 y.o. female.  HPI Patient reports he first got sick about 2 weeks ago.  She was having cough and chest congestion.  She tested positive for influenza A about a week ago.  During the course of illness she has had treatment with both Zithromax and Augmentin.  She is finished both prescriptions.  She was also treated with prednisone.  Currently all of these prescriptions are completed.  She reports she continues to feel very weak and even more so over the past couple of days.  She reports is very hard to get up and even do anything.  She reports that she was having trouble eating and drinking and immediately getting full but more recently she has had some appetite and been trying to eat and drink more.  She has not had any rebound of fever.  She does continue to have a cough.  She denies any chest pain or abdominal pain.  She denies pain or burning with urination.    Home Medications Prior to Admission medications   Medication Sig Start Date End Date Taking? Authorizing Provider  benzonatate (TESSALON) 200 MG capsule Take 1 capsule (200 mg total) by mouth 3 (three) times daily as needed for cough. 09/30/22  Yes Charlesetta Shanks, MD  doxycycline (VIBRAMYCIN) 100 MG capsule Take 1 capsule (100 mg total) by mouth 2 (two) times daily. 09/30/22  Yes Charlesetta Shanks, MD  metFORMIN (GLUCOPHAGE) 500 MG tablet Take 1 tablet (500 mg total) by mouth 2 (two) times daily with a meal. 09/30/22  Yes Brailon Don, Jeannie Done, MD  albuterol (VENTOLIN HFA) 108 (90 Base) MCG/ACT inhaler Inhale 1-2 puffs into the lungs every 4 (four) hours as needed for wheezing or shortness of breath. 05/29/21   Thurnell Lose, MD  albuterol (VENTOLIN HFA) 108 (90 Base) MCG/ACT inhaler Inhale 2 puffs into the lungs  every 4 (four) hours as needed for wheezing or shortness of breath. 09/15/22   Volney American, PA-C  amLODipine (NORVASC) 5 MG tablet Take 5 mg by mouth daily.    [provider]  amoxicillin-clavulanate (AUGMENTIN) 875-125 MG tablet Take 1 tablet by mouth every 12 (twelve) hours. 09/20/22   Volney American, PA-C  atorvastatin (LIPITOR) 80 MG tablet Take 1 tablet (80 mg total) by mouth daily at 6 PM. 07/06/15   Rosalin Hawking, MD  azelastine (ASTELIN) 0.1 % nasal spray Place 1 spray into both nostrils 2 (two) times daily. Use in each nostril as directed 07/06/21   Martyn Ehrich, NP  azelastine (ASTELIN) 0.1 % nasal spray Place 1 spray into both nostrils 2 (two) times daily. Use in each nostril as directed 09/15/22   Volney American, PA-C  azithromycin (ZITHROMAX) 250 MG tablet Take first 2 tablets together, then 1 every day until finished. 09/20/22   Volney American, PA-C  benzonatate (TESSALON) 200 MG capsule Take 1 capsule (200 mg total) by mouth 3 (three) times daily as needed for cough. 09/23/22   Baird Lyons D, MD  clobetasol cream (TEMOVATE) 2.83 % Apply 1 application topically daily at 6 (six) AM. 12/23/20   [provider]  clonazePAM (KLONOPIN) 1 MG tablet Take 1 mg by mouth 2 (two) times daily as needed for anxiety. 08/24/20  [provider]  clopidogrel (PLAVIX) 75 MG tablet Take 1 tablet (75 mg total) by mouth daily. 06/01/17   Rosalin Hawking, MD  DULoxetine (CYMBALTA) 30 MG capsule Take 30 mg by mouth daily. 05/05/21   [provider]  Fluticasone-Salmeterol (ADVAIR) 250-50 MCG/DOSE AEPB Inhale 1 puff into the lungs 2 (two) times daily.    [provider]  glipiZIDE (GLUCOTROL) 5 MG tablet Take 5 mg by mouth every morning. 09/29/22   [provider]  guaiFENesin (MUCINEX) 600 MG 12 hr tablet Take 1 tablet (600 mg total) by mouth 2 (two) times daily as needed. 09/20/22   Volney American, PA-C  isosorbide  mononitrate (IMDUR) 30 MG 24 hr tablet TAKE 1 TABLET BY MOUTH DAILY 07/07/22   [name], Wonda Cheng, MD  JARDIANCE 10 MG TABS tablet Take 10 mg by mouth daily. 09/21/22   [provider]  loratadine (CLARITIN) 10 MG tablet Take 10 mg by mouth daily.    [provider]  metFORMIN (GLUCOPHAGE-XR) 500 MG 24 hr tablet Take 1 tablet by mouth in the morning, at noon, and at bedtime. 03/05/22   [provider]  montelukast (SINGULAIR) 10 MG tablet Take 1 tablet (10 mg total) by mouth at bedtime. 05/29/21   Thurnell Lose, MD  nitroGLYCERIN (NITROSTAT) 0.4 MG SL tablet Place 1 tablet (0.4 mg total) under the tongue every 5 (five) minutes as needed for chest pain. 05/07/19   [name], Wonda Cheng, MD  nortriptyline (PAMELOR) 10 MG capsule Take 2 capsules by mouth daily. 06/27/17   [provider]  ondansetron (ZOFRAN) 4 MG tablet Take 1 tablet (4 mg total) by mouth every 8 (eight) hours as needed for nausea or vomiting. 02/01/22   Sharyn Creamer, MD  pantoprazole (PROTONIX) 40 MG tablet Take 1 tablet (40 mg total) by mouth daily. 04/07/22   Sharyn Creamer, MD  predniSONE (DELTASONE) 20 MG tablet Take 2 tablets (40 mg total) by mouth daily with breakfast. 09/15/22   Volney American, PA-C  promethazine-dextromethorphan (PROMETHAZINE-DM) 6.25-15 MG/5ML syrup Take 5 mLs by mouth 4 (four) times daily as needed. 09/15/22   Volney American, PA-C      Allergies    Ciprofloxacin, Codeine, Doxycycline, Percocet [oxycodone-acetaminophen], and Tramadol    Review of Systems   Review of Systems  Physical Exam Updated Vital Signs BP (!) 176/85   Pulse (!) 105   Temp 98.4 F (36.9 C) (Oral)   Resp (!) 21   Ht '5\' 5"'$  (1.651 m)   Wt 68.9 kg   SpO2 100%   BMI 25.28 kg/m  Physical Exam Constitutional:      Comments: Alert.  Nontoxic.  Mental status clear.  Fatigued in appearance but no acute distress.  HENT:     Nose: Nose normal.     Mouth/Throat:     Mouth: Mucous membranes  are dry.     Pharynx: Oropharynx is clear.  Eyes:     Extraocular Movements: Extraocular movements intact.  Cardiovascular:     Comments: Mild tachycardia.  No appreciable rub murmur gallop.  Monitor shows sinus rhythm narrow complex. Pulmonary:     Comments: No respiratory distress.  Intermittent wet sounding cough.  Soft breath sounds at the bases.  Expiratory wheeze bilaterally. Abdominal:     General: There is no distension.     Palpations: Abdomen is soft.     Tenderness: There is no abdominal tenderness. There is no guarding.  Musculoskeletal:  General: No swelling. Normal range of motion.     Right lower leg: No edema.     Left lower leg: No edema.     Comments: No peripheral edema.  Calves are soft and pliable.  Skin condition good.  Skin:    General: Skin is warm and dry.     Coloration: Skin is pale.  Neurological:     General: No focal deficit present.     Mental Status: She is oriented to person, place, and time.     Coordination: Coordination normal.     Comments: No focal weakness.  Movements are coordinated purposeful symmetric.  Psychiatric:        Mood and Affect: Mood normal.     ED Results / Procedures / Treatments   Labs (all labs ordered are listed, but only abnormal results are displayed) Labs Reviewed  BASIC METABOLIC PANEL - Abnormal; Notable for the following components:      Result Value   Sodium 134 (*)    Glucose, Bld 310 (*)    BUN 27 (*)    Creatinine, Ser 1.21 (*)    GFR, Estimated 45 (*)    All other components within normal limits  CBC - Abnormal; Notable for the following components:   WBC 11.8 (*)    Hemoglobin 15.3 (*)    All other components within normal limits  HEPATIC FUNCTION PANEL - Abnormal; Notable for the following components:   AST 14 (*)    All other components within normal limits  URINALYSIS, ROUTINE W REFLEX MICROSCOPIC - Abnormal; Notable for the following components:   Color, Urine COLORLESS (*)    Glucose,  UA >1,000 (*)    Ketones, ur 15 (*)    All other components within normal limits  BASIC METABOLIC PANEL - Abnormal; Notable for the following components:   Glucose, Bld 174 (*)    All other components within normal limits  CBG MONITORING, ED - Abnormal; Notable for the following components:   Glucose-Capillary 322 (*)    All other components within normal limits  I-STAT VENOUS BLOOD GAS, ED - Abnormal; Notable for the following components:   pH, Ven 7.460 (*)    pCO2, Ven 35.4 (*)    Sodium 134 (*)    All other components within normal limits  RESP PANEL BY RT-PCR (RSV, FLU A&B, COVID)  RVPGX2  CULTURE, BLOOD (ROUTINE X 2)  CULTURE, BLOOD (ROUTINE X 2)  LACTIC ACID, PLASMA  LIPASE, BLOOD  BRAIN NATRIURETIC PEPTIDE  TROPONIN I (HIGH SENSITIVITY)  TROPONIN I (HIGH SENSITIVITY)    EKG EKG Interpretation  Date/Time:  Friday September 30 2022 13:35:57 EST Ventricular Rate:  110 PR Interval:  144 QRS Duration: 70 QT Interval:  320 QTC Calculation: 433 R Axis:   -16 Text Interpretation: Sinus tachycardia with Premature atrial complexes Minimal voltage criteria for LVH, may be normal variant ( R in aVL ) Borderline ECG When compared with ECG of 04-Jul-2021 23:56, PREVIOUS ECG IS PRESENT tachycardia otherwise no sig change Confirmed by Charlesetta Shanks [id number]) on 09/30/2022 3:37:18 PM  Radiology CT Angio Chest PE W/Cm &/Or Wo Cm  Result Date: 09/30/2022 CLINICAL DATA:  Pulmonary embolus suspected with high probability. EXAM: CT ANGIOGRAPHY CHEST WITH CONTRAST TECHNIQUE: Multidetector CT imaging of the chest was performed using the standard protocol during bolus administration of intravenous contrast. Multiplanar CT image reconstructions and MIPs were obtained to evaluate the vascular anatomy. RADIATION DOSE REDUCTION: This exam was performed according to the departmental dose-optimization  program which includes automated exposure control, adjustment of the mA and/or kV according to patient  size and/or use of iterative reconstruction technique. CONTRAST:  84m OMNIPAQUE IOHEXOL 350 MG/ML SOLN COMPARISON:  05/27/2021 FINDINGS: Cardiovascular: Motion artifact is present. There is good opacification of the central and segmental pulmonary arteries. No focal filling defects. No evidence of significant pulmonary embolus. Normal heart size. No pericardial effusions. Normal caliber thoracic aorta. Calcification of the aorta and coronary arteries. Mediastinum/Nodes: Esophagus is decompressed. No significant lymphadenopathy. Thyroid gland is unremarkable. Lungs/Pleura: Patchy airspace infiltrates demonstrated throughout both lungs most likely representing edema. Multifocal pneumonia would be a secondary consideration. No pleural effusions. No pneumothorax. Upper Abdomen: Surgical absence of the gallbladder. No acute abnormalities. Musculoskeletal: Right breast prosthesis with surgical clips in the right axilla. Degenerative changes in the spine. Old left rib fractures. Review of the MIP images confirms the above findings. IMPRESSION: 1. No evidence of significant pulmonary embolus. 2. Patchy airspace disease diffusely throughout both lungs likely representing edema. Multifocal pneumonia could also have this appearance in the appropriate clinical setting. Electronically Signed   By: WLucienne CapersM.D.   On: 09/30/2022 19:17   DG Chest 2 View  Result Date: 09/30/2022 CLINICAL DATA:  Shortness of breath and weakness EXAM: CHEST - 2 VIEW COMPARISON:  Chest x-ray 09/15/2022 FINDINGS: The heart size and mediastinal contours are within normal limits. Both lungs are clear. The visualized skeletal structures are unremarkable. Right axillary surgical clips are again seen. IMPRESSION: No active cardiopulmonary disease. Electronically Signed   By: ARonney AstersM.D.   On: 09/30/2022 15:10    Procedures Procedures    Medications Ordered in ED Medications  doxycycline (VIBRA-TABS) tablet 100 mg (has no  administration in time range)  lactated ringers bolus 1,000 mL (0 mLs Intravenous Stopped 09/30/22 1728)  ipratropium-albuterol (DUONEB) 0.5-2.5 (3) MG/3ML nebulizer solution 3 mL (3 mLs Nebulization Given 09/30/22 1613)  lactated ringers bolus 500 mL ( Intravenous Stopped 09/30/22 1954)  iohexol (OMNIPAQUE) 350 MG/ML injection 100 mL (75 mLs Intravenous Contrast Given 09/30/22 1847)  ipratropium-albuterol (DUONEB) 0.5-2.5 (3) MG/3ML nebulizer solution 3 mL (3 mLs Nebulization Given 09/30/22 2007)    ED Course/ Medical Decision Making/ A&P                             Medical Decision Making Amount and/or Complexity of Data Reviewed Labs: ordered. Radiology: ordered.  Risk Prescription drug management.   Patient presents with approximately 2 weeks of illness.  She has had diagnosis of influenza A about a week ago.  In the course of this she has had 2 courses of antibiotics.  Patient had a period of significantly decreased oral intake by history.  At this time she is mildly tachycardic without associated chest pain and is denying shortness of breath.  Differential diagnosis includes persisting viral syndrome with influenza A\dehydration\ACS\other infectious etiology\metabolic derangement.  Will proceed with lab work and chest x-ray.  Patient has creatinine elevated at 1.2 from baseline and GFR decreased to 45 from baseline of 60 mL/min.  Also potential hemoconcentration with hemoglobin at 15.3.  Influenza testing and COVID testing negative at this time.  CBG 310.  Findings suggestive of dehydration.  Will rehydrate with 1 L LR.  After rehydration patient reports feeling much improved.  She also felt improvement with DuoNeb therapy.  CT chest obtained.  I have both visually reviewed and reviewed results from radiology for CT chest.  No PE present.  Some diffuse patchy infiltrate suggestive of either pulmonary edema or possibly atypical infectious pattern per radiology.  BNP normal.  I reviewed  patient's echo and in 2022 patient had no diastolic or systolic dysfunction.  Normal echo.  This time I do not suspect CHF.  Given patient's history and findings, consistent with infectious etiology whether viral or bacterial.  At this time I suspect patient is having residual changes from her previous influenza but may have secondary atypical pneumonia.  She does have white count of 13,000.  Will have her take another course of antibiotics with doxycycline twice daily for atypical pneumonia.  Patient has improved dramatically after rehydration and repeat chemistry shows resolution of AKI and blood sugar significantly corrected down to 200s.  Patient is stable for trial of home management.  She will continue her Jardiance and glipizide as prescribed by her provider.  We extensively discussed her wish to go back on metformin.  At this time however patient must be 48 hours off metformin due to IV contrast material.  She will follow-up with her doctor the beginning of the week to discuss ongoing management of her oral diabetic agents.  In the meantime she will carefully monitor her blood sugars at home.  Extensive return precautions reviewed.  All findings and planning reviewed with patient's husband at bedside.        Final Clinical Impression(s) / ED Diagnoses Final diagnoses:  Atypical pneumonia  Dehydration    Rx / DC Orders ED Discharge Orders          Ordered    doxycycline (VIBRAMYCIN) 100 MG capsule  2 times daily        09/30/22 2109    benzonatate (TESSALON) 200 MG capsule  3 times daily PRN        09/30/22 2109    metFORMIN (GLUCOPHAGE) 500 MG tablet  2 times daily with meals        09/30/22 2119              Charlesetta Shanks, MD 09/30/22 2130

## 2022-10-01 ENCOUNTER — Encounter (HOSPITAL_BASED_OUTPATIENT_CLINIC_OR_DEPARTMENT_OTHER): Payer: Self-pay | Admitting: *Deleted

## 2022-10-01 ENCOUNTER — Emergency Department (HOSPITAL_BASED_OUTPATIENT_CLINIC_OR_DEPARTMENT_OTHER): Payer: PPO

## 2022-10-01 ENCOUNTER — Inpatient Hospital Stay (HOSPITAL_BASED_OUTPATIENT_CLINIC_OR_DEPARTMENT_OTHER)
Admission: EM | Admit: 2022-10-01 | Discharge: 2022-10-04 | DRG: 640 | Disposition: A | Discharge status: Home / Self Care | Payer: PPO | Attending: Internal Medicine | Admitting: Internal Medicine

## 2022-10-01 ENCOUNTER — Other Ambulatory Visit: Payer: Self-pay

## 2022-10-01 ENCOUNTER — Encounter (HOSPITAL_COMMUNITY): Payer: Self-pay

## 2022-10-01 DIAGNOSIS — B338 Other specified viral diseases: Secondary | ICD-10-CM | POA: Diagnosis present

## 2022-10-01 DIAGNOSIS — R531 Weakness: Secondary | ICD-10-CM | POA: Diagnosis not present

## 2022-10-01 DIAGNOSIS — T364X5A Adverse effect of tetracyclines, initial encounter: Secondary | ICD-10-CM | POA: Diagnosis present

## 2022-10-01 DIAGNOSIS — Y929 Unspecified place or not applicable: Secondary | ICD-10-CM | POA: Diagnosis not present

## 2022-10-01 DIAGNOSIS — T3695XA Adverse effect of unspecified systemic antibiotic, initial encounter: Secondary | ICD-10-CM | POA: Diagnosis present

## 2022-10-01 DIAGNOSIS — Z885 Allergy status to narcotic agent status: Secondary | ICD-10-CM

## 2022-10-01 DIAGNOSIS — Z794 Long term (current) use of insulin: Secondary | ICD-10-CM | POA: Diagnosis not present

## 2022-10-01 DIAGNOSIS — Z7984 Long term (current) use of oral hypoglycemic drugs: Secondary | ICD-10-CM | POA: Diagnosis not present

## 2022-10-01 DIAGNOSIS — E782 Mixed hyperlipidemia: Secondary | ICD-10-CM | POA: Diagnosis not present

## 2022-10-01 DIAGNOSIS — I110 Hypertensive heart disease with heart failure: Secondary | ICD-10-CM | POA: Diagnosis present

## 2022-10-01 DIAGNOSIS — J45909 Unspecified asthma, uncomplicated: Secondary | ICD-10-CM | POA: Diagnosis not present

## 2022-10-01 DIAGNOSIS — Z1152 Encounter for screening for COVID-19: Secondary | ICD-10-CM

## 2022-10-01 DIAGNOSIS — E860 Dehydration: Secondary | ICD-10-CM

## 2022-10-01 DIAGNOSIS — K219 Gastro-esophageal reflux disease without esophagitis: Secondary | ICD-10-CM | POA: Diagnosis present

## 2022-10-01 DIAGNOSIS — I152 Hypertension secondary to endocrine disorders: Secondary | ICD-10-CM | POA: Diagnosis present

## 2022-10-01 DIAGNOSIS — Z8673 Personal history of transient ischemic attack (TIA), and cerebral infarction without residual deficits: Secondary | ICD-10-CM

## 2022-10-01 DIAGNOSIS — S92511A Displaced fracture of proximal phalanx of right lesser toe(s), initial encounter for closed fracture: Secondary | ICD-10-CM | POA: Diagnosis present

## 2022-10-01 DIAGNOSIS — E872 Acidosis, unspecified: Secondary | ICD-10-CM | POA: Diagnosis present

## 2022-10-01 DIAGNOSIS — E1165 Type 2 diabetes mellitus with hyperglycemia: Secondary | ICD-10-CM | POA: Diagnosis present

## 2022-10-01 DIAGNOSIS — M109 Gout, unspecified: Secondary | ICD-10-CM | POA: Diagnosis present

## 2022-10-01 DIAGNOSIS — F411 Generalized anxiety disorder: Secondary | ICD-10-CM | POA: Diagnosis not present

## 2022-10-01 DIAGNOSIS — Z888 Allergy status to other drugs, medicaments and biological substances status: Secondary | ICD-10-CM

## 2022-10-01 DIAGNOSIS — I700 Atherosclerosis of aorta: Secondary | ICD-10-CM | POA: Diagnosis present

## 2022-10-01 DIAGNOSIS — R519 Headache, unspecified: Secondary | ICD-10-CM | POA: Diagnosis present

## 2022-10-01 DIAGNOSIS — B974 Respiratory syncytial virus as the cause of diseases classified elsewhere: Secondary | ICD-10-CM | POA: Diagnosis not present

## 2022-10-01 DIAGNOSIS — Z8249 Family history of ischemic heart disease and other diseases of the circulatory system: Secondary | ICD-10-CM | POA: Diagnosis not present

## 2022-10-01 DIAGNOSIS — Z9221 Personal history of antineoplastic chemotherapy: Secondary | ICD-10-CM

## 2022-10-01 DIAGNOSIS — Z825 Family history of asthma and other chronic lower respiratory diseases: Secondary | ICD-10-CM | POA: Diagnosis not present

## 2022-10-01 DIAGNOSIS — Z853 Personal history of malignant neoplasm of breast: Secondary | ICD-10-CM | POA: Diagnosis not present

## 2022-10-01 DIAGNOSIS — R112 Nausea with vomiting, unspecified: Secondary | ICD-10-CM

## 2022-10-01 DIAGNOSIS — Z79899 Other long term (current) drug therapy: Secondary | ICD-10-CM

## 2022-10-01 DIAGNOSIS — Z923 Personal history of irradiation: Secondary | ICD-10-CM

## 2022-10-01 DIAGNOSIS — Z9011 Acquired absence of right breast and nipple: Secondary | ICD-10-CM

## 2022-10-01 DIAGNOSIS — E871 Hypo-osmolality and hyponatremia: Secondary | ICD-10-CM | POA: Diagnosis present

## 2022-10-01 DIAGNOSIS — I10 Essential (primary) hypertension: Secondary | ICD-10-CM

## 2022-10-01 DIAGNOSIS — J121 Respiratory syncytial virus pneumonia: Secondary | ICD-10-CM | POA: Diagnosis present

## 2022-10-01 DIAGNOSIS — I5032 Chronic diastolic (congestive) heart failure: Secondary | ICD-10-CM | POA: Diagnosis not present

## 2022-10-01 DIAGNOSIS — J9601 Acute respiratory failure with hypoxia: Secondary | ICD-10-CM | POA: Diagnosis not present

## 2022-10-01 DIAGNOSIS — Z7902 Long term (current) use of antithrombotics/antiplatelets: Secondary | ICD-10-CM

## 2022-10-01 DIAGNOSIS — R9389 Abnormal findings on diagnostic imaging of other specified body structures: Secondary | ICD-10-CM | POA: Diagnosis present

## 2022-10-01 DIAGNOSIS — E1159 Type 2 diabetes mellitus with other circulatory complications: Secondary | ICD-10-CM | POA: Diagnosis present

## 2022-10-01 DIAGNOSIS — E1169 Type 2 diabetes mellitus with other specified complication: Secondary | ICD-10-CM | POA: Diagnosis present

## 2022-10-01 DIAGNOSIS — Z7952 Long term (current) use of systemic steroids: Secondary | ICD-10-CM

## 2022-10-01 LAB — URINALYSIS, ROUTINE W REFLEX MICROSCOPIC
Bacteria, UA: NONE SEEN
Bilirubin Urine: NEGATIVE
Glucose, UA: >1000 mg/dL — AB
Hgb urine dipstick: NEGATIVE
Ketones, ur: 15 mg/dL — AB
Leukocytes,Ua: NEGATIVE
Nitrite: NEGATIVE
Protein, ur: NEGATIVE mg/dL
Specific Gravity, Urine: 1.036 — ABNORMAL HIGH (ref 1.005–1.030)
pH: 7 (ref 5.0–8.0)

## 2022-10-01 LAB — CBC WITH DIFFERENTIAL/PLATELET
Abs Immature Granulocytes: 0.16 10*3/uL — ABNORMAL HIGH (ref 0.00–0.07)
Basophils Absolute: 0 10*3/uL (ref 0.0–0.1)
Basophils Relative: 0 %
Eosinophils Absolute: 0.1 10*3/uL (ref 0.0–0.5)
Eosinophils Relative: 1 %
HCT: 41.6 % (ref 36.0–46.0)
Hemoglobin: 14.9 g/dL (ref 12.0–15.0)
Immature Granulocytes: 1 %
Lymphocytes Relative: 12 %
Lymphs Abs: 1.5 10*3/uL (ref 0.7–4.0)
MCH: 31.6 pg (ref 26.0–34.0)
MCHC: 35.8 g/dL (ref 30.0–36.0)
MCV: 88.3 fL (ref 80.0–100.0)
Monocytes Absolute: 1 10*3/uL (ref 0.1–1.0)
Monocytes Relative: 8 %
Neutro Abs: 9.5 10*3/uL — ABNORMAL HIGH (ref 1.7–7.7)
Neutrophils Relative %: 78 %
Platelets: 247 10*3/uL (ref 150–400)
RBC: 4.71 MIL/uL (ref 3.87–5.11)
RDW: 12.6 % (ref 11.5–15.5)
WBC: 12.4 10*3/uL — ABNORMAL HIGH (ref 4.0–10.5)
nRBC: 0 % (ref 0.0–0.2)

## 2022-10-01 LAB — I-STAT VENOUS BLOOD GAS, ED
Acid-Base Excess: 2 mmol/L (ref 0.0–2.0)
Bicarbonate: 26.7 mmol/L (ref 20.0–28.0)
Calcium, Ion: 1.19 mmol/L (ref 1.15–1.40)
HCT: 41 % (ref 36.0–46.0)
Hemoglobin: 13.9 g/dL (ref 12.0–15.0)
O2 Saturation: 43 %
Patient temperature: 97.9
Potassium: 4.7 mmol/L (ref 3.5–5.1)
Sodium: 132 mmol/L — ABNORMAL LOW (ref 135–145)
TCO2: 28 mmol/L (ref 22–32)
pCO2, Ven: 41.3 mmHg — ABNORMAL LOW (ref 44–60)
pH, Ven: 7.416 (ref 7.25–7.43)
pO2, Ven: 23 mmHg — CL (ref 32–45)

## 2022-10-01 LAB — CBG MONITORING, ED: Glucose-Capillary: 365 mg/dL — ABNORMAL HIGH (ref 70–99)

## 2022-10-01 LAB — COMPREHENSIVE METABOLIC PANEL
ALT: 23 U/L (ref 0–44)
AST: 13 U/L — ABNORMAL LOW (ref 15–41)
Albumin: 4 g/dL (ref 3.5–5.0)
Alkaline Phosphatase: 70 U/L (ref 38–126)
Anion gap: 12 (ref 5–15)
BUN: 22 mg/dL (ref 8–23)
CO2: 23 mmol/L (ref 22–32)
Calcium: 9.5 mg/dL (ref 8.9–10.3)
Chloride: 96 mmol/L — ABNORMAL LOW (ref 98–111)
Creatinine, Ser: 0.99 mg/dL (ref 0.44–1.00)
GFR, Estimated: 57 mL/min — ABNORMAL LOW (ref >60)
Glucose, Bld: 371 mg/dL — ABNORMAL HIGH (ref 70–99)
Potassium: 4.5 mmol/L (ref 3.5–5.1)
Sodium: 131 mmol/L — ABNORMAL LOW (ref 135–145)
Total Bilirubin: 1.5 mg/dL — ABNORMAL HIGH (ref 0.3–1.2)
Total Protein: 6.8 g/dL (ref 6.5–8.1)

## 2022-10-01 LAB — TROPONIN I (HIGH SENSITIVITY)
Troponin I (High Sensitivity): 27 ng/L — ABNORMAL HIGH (ref <18)
Troponin I (High Sensitivity): 20 ng/L — ABNORMAL HIGH (ref <18)

## 2022-10-01 LAB — LIPASE, BLOOD: Lipase: 23 U/L (ref 11–51)

## 2022-10-01 LAB — BRAIN NATRIURETIC PEPTIDE: B Natriuretic Peptide: 104.8 pg/mL — ABNORMAL HIGH (ref 0.0–100.0)

## 2022-10-01 LAB — LACTIC ACID, PLASMA
Lactic Acid, Venous: 2.2 mmol/L (ref 0.5–1.9)
Lactic Acid, Venous: 1.9 mmol/L (ref 0.5–1.9)

## 2022-10-01 MED ORDER — ACETAMINOPHEN 325 MG PO TABS
650.0000 mg | ORAL_TABLET | Freq: Four times a day (QID) | ORAL | Status: DC | PRN
  Administered 2022-10-01 – 2022-10-04 (×7): 650 mg via ORAL
  Filled 2022-10-01 (×7): qty 2

## 2022-10-01 MED ORDER — LACTATED RINGERS IV SOLN: INTRAVENOUS | Status: AC

## 2022-10-01 MED ORDER — SODIUM CHLORIDE 0.9 % IV SOLN
500.0000 mg | Freq: Once | INTRAVENOUS | Status: AC
  Administered 2022-10-01: 500 mg via INTRAVENOUS
  Filled 2022-10-01: qty 5

## 2022-10-01 MED ORDER — ONDANSETRON HCL 4 MG/2ML IJ SOLN: 4.0000 mg | Freq: Three times a day (TID) | INTRAMUSCULAR | Status: DC | PRN

## 2022-10-01 MED ORDER — LACTATED RINGERS IV BOLUS
1000.0000 mL | Freq: Once | INTRAVENOUS | Status: AC
  Administered 2022-10-01: 1000 mL via INTRAVENOUS

## 2022-10-01 MED ORDER — SODIUM CHLORIDE 0.9 % IV SOLN
1.0000 g | Freq: Once | INTRAVENOUS | Status: AC
  Administered 2022-10-01: 1 g via INTRAVENOUS
  Filled 2022-10-01: qty 10

## 2022-10-01 MED ORDER — ONDANSETRON HCL 4 MG/2ML IJ SOLN
4.0000 mg | Freq: Once | INTRAMUSCULAR | Status: AC
  Administered 2022-10-01: 4 mg via INTRAVENOUS
  Filled 2022-10-01: qty 2

## 2022-10-01 NOTE — Assessment & Plan Note (Signed)
Likely due to dehydration. Continue with IVF overnight. Repeat CMP in AM.

## 2022-10-01 NOTE — Assessment & Plan Note (Addendum)
Hyperglycemia improving.  Hyperglycemia is likely secondary to courses of systemic steroids prescribed during patient's numerous presentations for her shortness of breath over the past month. Patient been placed on Accu-Cheks before every meal and nightly with sliding scale insulin Holding home regimen of oral hypoglycemics. Hemoglobin A1C pending Diabetic Diet .

## 2022-10-01 NOTE — Hospital Course (Addendum)
59yof w/ hypertension, hyperlipidemia, NIDDMT2-on glipizide/Jardiance/metformin, diastolic congestive heart failure (Echo 05/2021 EF 55-60% with G1DD), asthma, prior stroke, gerd, breast cancer, gout presented to [place]  w/ progressively worsening nausea, vomiting weakness and bodyaches. Patient was diagnosed with flu and pneumonia 1/19, discharged on antibiotics but returned on 1/20 with inability to keep down antibiotic and continued symptoms, intolerance to doxycycline Patient was found to have dehydration with hyponatremia lactic acidosis that resolved with IV fluid hydration.  Patient having elevated blood pressure along with elevated blood sugar, respiratory symptoms with vague pulmonary infiltrates on CT chest, procalcitonin reassuring, also having headache.  Respiratory virus panel positive for RSV 1/21.  Diabetes: Has been consulted with uncontrolled hyperglycemia in the setting of acute illness and steroid use. At this time patient is tolerating diet no nausea or vomiting, having bowel movement.  Headache has resolved.  With the reason for her uncontrolled hyperglycemia, RSV infection seems to have improved with mild cough only, not hypoxic on at rest but needing oxygen on ambulation, will qualify for home oxygen. She would like to go home today, discussed plan of care with patient's family on the phone and at the bedside

## 2022-10-01 NOTE — H&P (Addendum)
History and Physical    [name] EVO:[id number] DOB: Mar 13, 1941 DOA: 10/01/2022  DOS: the patient was seen and examined on 10/01/2022  PCP: Collene Leyden, MD   Patient coming from: Home  I have personally briefly reviewed patient's old medical records in [place]  CC: N/V HPI: 82 year old white female history of hypertension, type 2 diabetes, asthma presents to the ER today with nausea and vomiting.  She was seen in the ER yesterday due to persistent symptoms after having influenza last week.  Last week he was treated for atypical pneumonia with a course of Zithromax and Augmentin.  She was also treated with a course of prednisone.  She went to the ER yesterday due to continued weakness.  At that time she had a CT scan which showed atypical pneumonia.  White count was only 11.8.  She was also dehydrated with a creatinine of 1.21 and a BUN of 27.  She was given IV fluids.  Patient has a known history of intolerance to doxycycline which causes nausea and vomiting.  Based on her CT scan, patient was prescribed doxycycline and discharged to home.  Patient went home and had her prescription filled.  She states that she immediately vomited after receiving her first dose of doxycycline.  She continued to vomit.  She came back to the ER.  She was noted to be dehydrated.  [facility] contacted for admission.   ED Course: BUN 22, Scr 0.99.  Review of Systems:  Review of Systems  Constitutional:  Positive for malaise/fatigue.  HENT: Negative.    Eyes: Negative.   Respiratory:  Positive for cough.        No productive cough  Cardiovascular: Negative.   Gastrointestinal:  Positive for nausea and vomiting.       N/V after taking doxycycline.  Genitourinary: Negative.   Musculoskeletal: Negative.   Skin: Negative.   Neurological:  Positive for weakness.  Endo/Heme/Allergies: Negative.        Elevated BS  Psychiatric/Behavioral: Negative.    All other systems reviewed and are  negative.   Past Medical History:  Diagnosis Date   Aortic atherosclerosis ([place])    Asthma    Breast CA ([place])    Cancer ([place])    right breast mastectomy   CVA (cerebral vascular accident) ([place]) 04/27/2015   DDD (degenerative disc disease), cervical    Duodenal ulcer    External hemorrhoids    Generalized anxiety disorder    H/O chest pain 03/24/2006   normal nuclear  study   Headache    High cholesterol    Hypertension    Mild aortic insufficiency 03/28/2006   echo   Normal cardiac stress test 2005   Pancreatitis    Personal history of chemotherapy    Personal history of radiation therapy    PONV (postoperative nausea and vomiting)    Sinus bradycardia on ECG    Stroke [facility])     Past Surgical History:  Procedure Laterality Date   ABDOMINAL HYSTERECTOMY     APPENDECTOMY     "30 years ago"   childbirth     x 2   CHOLECYSTECTOMY     "15 years ago"   ESOPHAGOGASTRODUODENOSCOPY N/A 03/04/2014   Procedure: ESOPHAGOGASTRODUODENOSCOPY (EGD);  Surgeon: [name], MD;  Location: [facility] ENDOSCOPY;  Service: Endoscopy;  Laterality: N/A;   MASTECTOMY  1993   right breast   RIGHT/LEFT HEART CATH AND CORONARY ANGIOGRAPHY N/A 05/30/2019   Procedure: RIGHT/LEFT HEART CATH AND CORONARY  ANGIOGRAPHY;  Surgeon: [name], MD;  Location: [facility] LAB;  Service: Cardiovascular;  Laterality: N/A;   TUBAL LIGATION       reports that she has never smoked. She has never used smokeless tobacco. She reports that she does not drink alcohol and does not use drugs.  Allergies  Allergen Reactions   Ciprofloxacin     Other reaction(s): Other (See Comments)   Codeine Nausea And Vomiting   Doxycycline Nausea And Vomiting    REACTION: GI upset   Percocet [Oxycodone-Acetaminophen] Nausea And Vomiting   Tramadol     Other reaction(s): Other (See Comments)    Family History  Problem Relation Age of Onset   Heart attack Mother    Heart disease Mother    Hypertension Mother     Asthma Mother    Heart attack Father    Heart disease Father    Hypertension Sister    Heart attack Sister    Colon cancer Sister 32   Hypertension Sister    Heart attack Sister    Heart attack Sister    Lung disease Daughter    Thyroid disease Daughter     Prior to Admission medications   Medication Sig Start Date End Date Taking? Authorizing Provider  albuterol (VENTOLIN HFA) 108 (90 Base) MCG/ACT inhaler Inhale 1-2 puffs into the lungs every 4 (four) hours as needed for wheezing or shortness of breath. 05/29/21   Thurnell Lose, MD  albuterol (VENTOLIN HFA) 108 (90 Base) MCG/ACT inhaler Inhale 2 puffs into the lungs every 4 (four) hours as needed for wheezing or shortness of breath. 09/15/22   Volney American, PA-C  amLODipine (NORVASC) 5 MG tablet Take 5 mg by mouth daily.    [provider]  amoxicillin-clavulanate (AUGMENTIN) 875-125 MG tablet Take 1 tablet by mouth every 12 (twelve) hours. 09/20/22   Volney American, PA-C  atorvastatin (LIPITOR) 80 MG tablet Take 1 tablet (80 mg total) by mouth daily at 6 PM. 07/06/15   Rosalin Hawking, MD  azelastine (ASTELIN) 0.1 % nasal spray Place 1 spray into both nostrils 2 (two) times daily. Use in each nostril as directed 07/06/21   Martyn Ehrich, NP  azelastine (ASTELIN) 0.1 % nasal spray Place 1 spray into both nostrils 2 (two) times daily. Use in each nostril as directed 09/15/22   Volney American, PA-C  azithromycin (ZITHROMAX) 250 MG tablet Take first 2 tablets together, then 1 every day until finished. 09/20/22   Volney American, PA-C  benzonatate (TESSALON) 200 MG capsule Take 1 capsule (200 mg total) by mouth 3 (three) times daily as needed for cough. 09/23/22   Deneise Lever, MD  benzonatate (TESSALON) 200 MG capsule Take 1 capsule (200 mg total) by mouth 3 (three) times daily as needed for cough. 09/30/22   Charlesetta Shanks, MD  clobetasol cream (TEMOVATE) 9.93 % Apply 1 application topically daily  at 6 (six) AM. 12/23/20   [provider]  clonazePAM (KLONOPIN) 1 MG tablet Take 1 mg by mouth 2 (two) times daily as needed for anxiety. 08/24/20   [provider]  clopidogrel (PLAVIX) 75 MG tablet Take 1 tablet (75 mg total) by mouth daily. 06/01/17   Rosalin Hawking, MD  doxycycline (VIBRAMYCIN) 100 MG capsule Take 1 capsule (100 mg total) by mouth 2 (two) times daily. 09/30/22   Charlesetta Shanks, MD  DULoxetine (CYMBALTA) 30 MG capsule Take 30 mg by mouth daily. 05/05/21   [provider]  Fluticasone-Salmeterol (ADVAIR) 250-50 MCG/DOSE AEPB Inhale 1 puff into the lungs 2 (two) times daily.    [provider]  glipiZIDE (GLUCOTROL) 5 MG tablet Take 5 mg by mouth every morning. 09/29/22   [provider]  guaiFENesin (MUCINEX) 600 MG 12 hr tablet Take 1 tablet (600 mg total) by mouth 2 (two) times daily as needed. 09/20/22   Volney American, PA-C  isosorbide mononitrate (IMDUR) 30 MG 24 hr tablet TAKE 1 TABLET BY MOUTH DAILY 07/07/22   [name], Wonda Cheng, MD  JARDIANCE 10 MG TABS tablet Take 10 mg by mouth daily. 09/21/22   [provider]  loratadine (CLARITIN) 10 MG tablet Take 10 mg by mouth daily.    [provider]  metFORMIN (GLUCOPHAGE) 500 MG tablet Take 1 tablet (500 mg total) by mouth 2 (two) times daily with a meal. 09/30/22   Charlesetta Shanks, MD  metFORMIN (GLUCOPHAGE-XR) 500 MG 24 hr tablet Take 1 tablet by mouth in the morning, at noon, and at bedtime. 03/05/22   [provider]  montelukast (SINGULAIR) 10 MG tablet Take 1 tablet (10 mg total) by mouth at bedtime. 05/29/21   Thurnell Lose, MD  nitroGLYCERIN (NITROSTAT) 0.4 MG SL tablet Place 1 tablet (0.4 mg total) under the tongue every 5 (five) minutes as needed for chest pain. 05/07/19   [name], Wonda Cheng, MD  nortriptyline (PAMELOR) 10 MG capsule Take 2 capsules by mouth daily. 06/27/17   [provider]  ondansetron (ZOFRAN) 4 MG tablet Take 1 tablet (4 mg  total) by mouth every 8 (eight) hours as needed for nausea or vomiting. 02/01/22   Sharyn Creamer, MD  pantoprazole (PROTONIX) 40 MG tablet Take 1 tablet (40 mg total) by mouth daily. 04/07/22   Sharyn Creamer, MD  predniSONE (DELTASONE) 20 MG tablet Take 2 tablets (40 mg total) by mouth daily with breakfast. 09/15/22   Volney American, PA-C  promethazine-dextromethorphan (PROMETHAZINE-DM) 6.25-15 MG/5ML syrup Take 5 mLs by mouth 4 (four) times daily as needed. 09/15/22   Volney American, PA-C    Physical Exam: Vitals:   10/01/22 1800 10/01/22 1815 10/01/22 2018 10/01/22 2057  BP: (!) 149/87   (!) 176/89  Pulse: (!) 105 (!) 107  (!) 106  Resp: (!) '22 19  16  '$ Temp:   98.1 F (36.7 C) 97.6 F (36.4 C)  TempSrc:   Oral Oral  SpO2: 93% 95%  95%  Weight:        Physical Exam Vitals and nursing note reviewed.  Constitutional:      General: She is not in acute distress.    Appearance: Normal appearance. She is not ill-appearing, toxic-appearing or diaphoretic.  HENT:     Head: Normocephalic and atraumatic.     Nose: Nose normal.  Eyes:     General: No scleral icterus. Cardiovascular:     Rate and Rhythm: Regular rhythm. Tachycardia present.     Pulses: Normal pulses.     Heart sounds: Normal heart sounds.  Pulmonary:     Effort: Pulmonary effort is normal.     Breath sounds: Examination of the right-lower field reveals rales. Examination of the left-lower field reveals rales. Rales present.  Abdominal:     General: Abdomen is flat. Bowel sounds are normal. There is no distension.     Palpations: Abdomen is soft.     Tenderness: There is no abdominal tenderness. There is no guarding or rebound.  Musculoskeletal:  Right lower leg: No edema.     Left lower leg: No edema.  Skin:    General: Skin is warm and dry.     Capillary Refill: Capillary refill takes less than 2 seconds.  Neurological:     General: No focal deficit present.     Mental Status: She is alert and  oriented to person, place, and time.      Labs on Admission: I have personally reviewed following labs and imaging studies  CBC: Recent Labs  Lab 09/30/22 1333 09/30/22 1626 10/01/22 1455 10/01/22 1549  WBC 11.8*  --  12.4*  --   NEUTROABS  --   --  9.5*  --   HGB 15.3* 14.3 14.9 13.9  HCT 43.2 42.0 41.6 41.0  MCV 87.8  --  88.3  --   PLT 244  --  247  --    Basic Metabolic Panel: Recent Labs  Lab 09/30/22 1333 09/30/22 1626 09/30/22 2000 10/01/22 1455 10/01/22 1549  NA 134* 134* 137 131* 132*  K 4.5 4.3 3.8 4.5 4.7  CL 98  --  98 96*  --   CO2 24  --  27 23  --   GLUCOSE 310*  --  174* 371*  --   BUN 27*  --  23 22  --   CREATININE 1.21*  --  0.93 0.99  --   CALCIUM 9.8  --  9.2 9.5  --    GFR: Estimated Creatinine Clearance: 42.7 mL/min (by C-G formula based on SCr of 0.99 mg/dL). Liver Function Tests: Recent Labs  Lab 09/30/22 1550 10/01/22 1455  AST 14* 13*  ALT 26 23  ALKPHOS 78 70  BILITOT 0.8 1.5*  PROT 7.6 6.8  ALBUMIN 4.2 4.0   Recent Labs  Lab 09/30/22 1550 10/01/22 1455  LIPASE 23 23   No results for input(s): "AMMONIA" in the last 168 hours. Coagulation Profile: No results for input(s): "INR", "PROTIME" in the last 168 hours. Cardiac Enzymes: Recent Labs  Lab 09/30/22 1333 09/30/22 1550 10/01/22 1455 10/01/22 1705  TROPONINIHS 12 12 27* 20*   BNP (last 3 results) No results for input(s): "PROBNP" in the last 8760 hours. HbA1C: No results for input(s): "HGBA1C" in the last 72 hours. CBG: Recent Labs  Lab 09/30/22 1340 10/01/22 1439  GLUCAP 322* 365*   Lipid Profile: No results for input(s): "CHOL", "HDL", "LDLCALC", "TRIG", "CHOLHDL", "LDLDIRECT" in the last 72 hours. Thyroid Function Tests: No results for input(s): "TSH", "T4TOTAL", "FREET4", "T3FREE", "THYROIDAB" in the last 72 hours. Anemia Panel: No results for input(s): "VITAMINB12", "FOLATE", "FERRITIN", "TIBC", "IRON", "RETICCTPCT" in the last 72 hours. Urine  analysis:    Component Value Date/Time   COLORURINE YELLOW 10/01/2022 1944   APPEARANCEUR CLEAR 10/01/2022 1944   LABSPEC 1.036 (H) 10/01/2022 1944   PHURINE 7.0 10/01/2022 1944   GLUCOSEU >1,000 (A) 10/01/2022 1944   HGBUR NEGATIVE 10/01/2022 1944   BILIRUBINUR NEGATIVE 10/01/2022 1944   KETONESUR 15 (A) 10/01/2022 1944   PROTEINUR NEGATIVE 10/01/2022 1944   UROBILINOGEN 0.2 06/30/2014 1930   NITRITE NEGATIVE 10/01/2022 1944   LEUKOCYTESUR NEGATIVE 10/01/2022 1944    Radiological Exams on Admission: I have personally reviewed images DG Chest Port 1 View  Result Date: 10/01/2022 CLINICAL DATA:  Weakness. EXAM: PORTABLE CHEST 1 VIEW COMPARISON:  Chest radiograph 09/30/2018 FINDINGS: Stable cardiac and mediastinal contours. Aortic atherosclerosis. No large area pulmonary consolidation. No pleural effusion or pneumothorax. Postsurgical changes overlying the right hemithorax. IMPRESSION: No active  disease. Electronically Signed   By: Lovey Newcomer M.D.   On: 10/01/2022 15:34   CT Angio Chest PE W/Cm &/Or Wo Cm  Result Date: 09/30/2022 CLINICAL DATA:  Pulmonary embolus suspected with high probability. EXAM: CT ANGIOGRAPHY CHEST WITH CONTRAST TECHNIQUE: Multidetector CT imaging of the chest was performed using the standard protocol during bolus administration of intravenous contrast. Multiplanar CT image reconstructions and MIPs were obtained to evaluate the vascular anatomy. RADIATION DOSE REDUCTION: This exam was performed according to the departmental dose-optimization program which includes automated exposure control, adjustment of the mA and/or kV according to patient size and/or use of iterative reconstruction technique. CONTRAST:  46m OMNIPAQUE IOHEXOL 350 MG/ML SOLN COMPARISON:  05/27/2021 FINDINGS: Cardiovascular: Motion artifact is present. There is good opacification of the central and segmental pulmonary arteries. No focal filling defects. No evidence of significant pulmonary embolus.  Normal heart size. No pericardial effusions. Normal caliber thoracic aorta. Calcification of the aorta and coronary arteries. Mediastinum/Nodes: Esophagus is decompressed. No significant lymphadenopathy. Thyroid gland is unremarkable. Lungs/Pleura: Patchy airspace infiltrates demonstrated throughout both lungs most likely representing edema. Multifocal pneumonia would be a secondary consideration. No pleural effusions. No pneumothorax. Upper Abdomen: Surgical absence of the gallbladder. No acute abnormalities. Musculoskeletal: Right breast prosthesis with surgical clips in the right axilla. Degenerative changes in the spine. Old left rib fractures. Review of the MIP images confirms the above findings. IMPRESSION: 1. No evidence of significant pulmonary embolus. 2. Patchy airspace disease diffusely throughout both lungs likely representing edema. Multifocal pneumonia could also have this appearance in the appropriate clinical setting. Electronically Signed   By: WLucienne CapersM.D.   On: 09/30/2022 19:17   DG Chest 2 View  Result Date: 09/30/2022 CLINICAL DATA:  Shortness of breath and weakness EXAM: CHEST - 2 VIEW COMPARISON:  Chest x-ray 09/15/2022 FINDINGS: The heart size and mediastinal contours are within normal limits. Both lungs are clear. The visualized skeletal structures are unremarkable. Right axillary surgical clips are again seen. IMPRESSION: No active cardiopulmonary disease. Electronically Signed   By: ARonney AstersM.D.   On: 09/30/2022 15:10    EKG: My personal interpretation of EKG shows: sinus tachycardia    Assessment/Plan Principal Problem:   Dehydration Active Problems:   HTN (hypertension)   Type 2 diabetes mellitus with hyperglycemia (HCC)   Hyponatremia    Assessment and Plan: * Dehydration Observation telemetry bed. Dehydration caused from N/V due to doxycycline. Pt has known intolerance to doxycycline. It is listed in her allergy list since 11-28-2007. Pt immediately  had N/V after she took her first dose of doxycycline yesterday. Not surprising.  She was unable to control her N/V at home and unable to take any of her meds. Also likely the cause of her hyperglycemic. After reviewing her CT chest, I would agree that it appears like an atypical infection. She had influenza A last week. Pt is not hypoxic. I do not think further abx therapy is indicated. Continue with IVF. Prn IV zofran to control her nausea.  I think with overnight hydration, she should be improved by morning. Pt has already eaten some solid food this evening without vomiting. In the future, prescribing Doxycyline for this patient should be avoided. If Rx of PO Doxycycline is absolutely necessary due to life threatening infection, pt will require pre-treatment with antiemetic prior to administration.  Type 2 diabetes mellitus with hyperglycemia (HCC) Add SSI. Check A1c. IVF hydration should also help with her hyperglycemia.  HTN (hypertension) Continue imdur 30 mg  and norvasc 5 mg  Hyponatremia Likely due to dehydration. Continue with IVF overnight. Repeat CMP in AM.   DVT prophylaxis: SQ Heparin Code Status: Full Code Family Communication: no family at bedside  Disposition Plan: return home  Consults called: none  Admission status: Observation, Telemetry bed   Kristopher Oppenheim, DO [facility] 10/01/2022, 10:24 PM

## 2022-10-01 NOTE — ED Triage Notes (Addendum)
BIB husband from home for continued weakness, nausea, generalized body aches. Dx'd yesterday with flu and PNA. Returns for inability to keep down antibiotic, and continued sx, and some worsening. BS 365, was 466 PTA. Also mentions RUQ pain.

## 2022-10-01 NOTE — ED Notes (Signed)
CRITICAL VALUE STICKER  CRITICAL VALUE:Lactate 2.2  RECEIVER (on-site recipient of call):Shawnie Pons, RN  DATE & TIME NOTIFIED:   MESSENGER (representative from lab):  MD NOTIFIED: Dr. Melina Copa  TIME OF NOTIFICATION:1638 RESPONSE:

## 2022-10-01 NOTE — Progress Notes (Signed)
Plan of Care Note for accepted transfer  Patient: [name]    VHQ:[id number]  DOA: 10/01/2022     Facility requesting transfer: [facility] Requesting Provider: Dr. Melina Copa Reason for transfer: Weakness, Lactic Acidosis, concern for persisting pneumonia Facility course:   82 year old female with past medical history of hypertension, hyperlipidemia, non-insulin-dependent diabetes mellitus type 2, diastolic congestive heart failure (Echo 05/2021 EF 55-60% with G1DD), asthma, prior stroke, gastroesophageal reflux disease, breast cancer, gout who presented to [facility] emergency department by private vehicle for progressively worsening weakness nausea and bodyaches.  Of note, patient has been experiencing a 1 month history of nasal congestion, cough, shortness of breath and wheezing.  Patient has been seen approximately 4 times in various urgent care clinics and has been treated with regimens of prednisone, Levaquin's, cefdinir, amoxicillin and lastly Augmentin and azithromycin after a 1/9 urgent care clinic visit.  Around that time patient was diagnosed with influenza A infection.  Patient then presented to [place] emergency department on 1/19 for shortness of breath and cough after which the patient was sent home with a course of doxycycline.    Upon repeat evaluation at [facility] emergency department today patient returns to [place] [facility] emergency department.  EDP read about concern the patient clinically extremely weak, clinically appears volume depleted with slightly elevated lactic acid of 2.2 and slightly elevated troponin of 27 in the absence of chest pain or dynamic EKG changes.  EDP requesting hospitalization for continued workup of progressively worsening weakness.  EDP has initiated azithromycin and ceftriaxone for treatment.  EDP has ordered 1 L of lactated Ringer solution.  Cultures were not obtained by the EDP due to them being ordered on the  1/19 presentation.  Plan of care:  Will place patient in observation in telemetry bed at [facility] first available.    Author: Vernelle Emerald, MD  10/01/2022  Check [email or web address] for on-call coverage.  Nursing staff, Please call [place] number on Amion as soon as patient's arrival, so appropriate admitting provider can evaluate the pt.

## 2022-10-01 NOTE — Subjective & Objective (Signed)
CC: N/V HPI: 82 year old white female history of hypertension, type 2 diabetes, asthma presents to the ER today with nausea and vomiting.  She was seen in the ER yesterday due to persistent symptoms after having influenza last week.  Last week he was treated for atypical pneumonia with a course of Zithromax and Augmentin.  She was also treated with a course of prednisone.  She went to the ER yesterday due to continued weakness.  At that time she had a CT scan which showed atypical pneumonia.  White count was only 11.8.  She was also dehydrated with a creatinine of 1.21 and a BUN of 27.  She was given IV fluids.  Patient has a known history of intolerance to doxycycline which causes nausea and vomiting.  Based on her CT scan, patient was prescribed doxycycline and discharged to home.  Patient went home and had her prescription filled.  She states that she immediately vomited after receiving her first dose of doxycycline.  She continued to vomit.  She came back to the ER.  She was noted to be dehydrated.  [facility] hospital[facility] contacted for admission.

## 2022-10-01 NOTE — Assessment & Plan Note (Signed)
Continue imdur 30 mg and norvasc 5 mg

## 2022-10-01 NOTE — Assessment & Plan Note (Addendum)
Markedly elevated blood pressures, possibly secondary to ongoing headaches. Continue amlodipine As needed intravenous hydralazine for markedly elevated blood pressure.

## 2022-10-01 NOTE — ED Provider Notes (Signed)
[facility] Provider Note   CSN: [id number] Arrival date & time: 10/01/22  1419     History  Chief Complaint  Patient presents with   Weakness    [name] is a 82 y.o. female.  She was seen yesterday and returns again today for general weakness and feeling like she needs to be admitted.  She began getting sick a few weeks ago with cough and congestion and tested positive for influenza.  She was treated with Zithromax and Augmentin, prednisone.  She has had no appetite feeling generally weak and nauseous.  Seen yesterday COVID flu testing negative.  Had a CT chest with possible pneumonia treated with fluids nausea medication and doxycycline.  She said she took the antibiotic but vomited up and does not feel like she can manage at home.  She has a headache cough productive of some yellow sputum nausea vomiting some right upper abdominal pain.  She has more longstanding intermittent diarrhea.  No urinary symptoms.  No known fevers.  The history is provided by the patient.  Weakness Severity:  Severe Onset quality:  Gradual Duration:  2 weeks Timing:  Constant Progression:  Worsening Context: recent infection   Relieved by:  Nothing Worsened by:  Activity Ineffective treatments:  Drinking fluids, rest and medication Associated symptoms: abdominal pain, difficulty walking, headaches, lethargy, nausea, shortness of breath and vomiting   Associated symptoms: no chest pain, no dysuria, no fever and no stroke symptoms   Risk factors: neurologic disease        Home Medications Prior to Admission medications   Medication Sig Start Date End Date Taking? Authorizing Provider  albuterol (VENTOLIN HFA) 108 (90 Base) MCG/ACT inhaler Inhale 1-2 puffs into the lungs every 4 (four) hours as needed for wheezing or shortness of breath. 05/29/21   Thurnell Lose, MD  albuterol (VENTOLIN HFA) 108 (90 Base) MCG/ACT inhaler Inhale 2 puffs into the  lungs every 4 (four) hours as needed for wheezing or shortness of breath. 09/15/22   Volney American, PA-C  amLODipine (NORVASC) 5 MG tablet Take 5 mg by mouth daily.    [provider]  amoxicillin-clavulanate (AUGMENTIN) 875-125 MG tablet Take 1 tablet by mouth every 12 (twelve) hours. 09/20/22   Volney American, PA-C  atorvastatin (LIPITOR) 80 MG tablet Take 1 tablet (80 mg total) by mouth daily at 6 PM. 07/06/15   Rosalin Hawking, MD  azelastine (ASTELIN) 0.1 % nasal spray Place 1 spray into both nostrils 2 (two) times daily. Use in each nostril as directed 07/06/21   Martyn Ehrich, NP  azelastine (ASTELIN) 0.1 % nasal spray Place 1 spray into both nostrils 2 (two) times daily. Use in each nostril as directed 09/15/22   Volney American, PA-C  azithromycin (ZITHROMAX) 250 MG tablet Take first 2 tablets together, then 1 every day until finished. 09/20/22   Volney American, PA-C  benzonatate (TESSALON) 200 MG capsule Take 1 capsule (200 mg total) by mouth 3 (three) times daily as needed for cough. 09/23/22   Deneise Lever, MD  benzonatate (TESSALON) 200 MG capsule Take 1 capsule (200 mg total) by mouth 3 (three) times daily as needed for cough. 09/30/22   Charlesetta Shanks, MD  clobetasol cream (TEMOVATE) 7.82 % Apply 1 application topically daily at 6 (six) AM. 12/23/20   [provider]  clonazePAM (KLONOPIN) 1 MG tablet Take 1 mg by mouth 2 (two) times daily as needed for anxiety.  08/24/20   [provider]  clopidogrel (PLAVIX) 75 MG tablet Take 1 tablet (75 mg total) by mouth daily. 06/01/17   Rosalin Hawking, MD  doxycycline (VIBRAMYCIN) 100 MG capsule Take 1 capsule (100 mg total) by mouth 2 (two) times daily. 09/30/22   Charlesetta Shanks, MD  DULoxetine (CYMBALTA) 30 MG capsule Take 30 mg by mouth daily. 05/05/21   [provider]  Fluticasone-Salmeterol (ADVAIR) 250-50 MCG/DOSE AEPB Inhale 1 puff into the lungs 2 (two) times daily.    [provider]  glipiZIDE (GLUCOTROL) 5 MG tablet Take 5 mg by mouth every morning. 09/29/22   [provider]  guaiFENesin (MUCINEX) 600 MG 12 hr tablet Take 1 tablet (600 mg total) by mouth 2 (two) times daily as needed. 09/20/22   Volney American, PA-C  isosorbide mononitrate (IMDUR) 30 MG 24 hr tablet TAKE 1 TABLET BY MOUTH DAILY 07/07/22   [name], Wonda Cheng, MD  JARDIANCE 10 MG TABS tablet Take 10 mg by mouth daily. 09/21/22   [provider]  loratadine (CLARITIN) 10 MG tablet Take 10 mg by mouth daily.    [provider]  metFORMIN (GLUCOPHAGE) 500 MG tablet Take 1 tablet (500 mg total) by mouth 2 (two) times daily with a meal. 09/30/22   Charlesetta Shanks, MD  metFORMIN (GLUCOPHAGE-XR) 500 MG 24 hr tablet Take 1 tablet by mouth in the morning, at noon, and at bedtime. 03/05/22   [provider]  montelukast (SINGULAIR) 10 MG tablet Take 1 tablet (10 mg total) by mouth at bedtime. 05/29/21   Thurnell Lose, MD  nitroGLYCERIN (NITROSTAT) 0.4 MG SL tablet Place 1 tablet (0.4 mg total) under the tongue every 5 (five) minutes as needed for chest pain. 05/07/19   [name], Wonda Cheng, MD  nortriptyline (PAMELOR) 10 MG capsule Take 2 capsules by mouth daily. 06/27/17   [provider]  ondansetron (ZOFRAN) 4 MG tablet Take 1 tablet (4 mg total) by mouth every 8 (eight) hours as needed for nausea or vomiting. 02/01/22   Sharyn Creamer, MD  pantoprazole (PROTONIX) 40 MG tablet Take 1 tablet (40 mg total) by mouth daily. 04/07/22   Sharyn Creamer, MD  predniSONE (DELTASONE) 20 MG tablet Take 2 tablets (40 mg total) by mouth daily with breakfast. 09/15/22   Volney American, PA-C  promethazine-dextromethorphan (PROMETHAZINE-DM) 6.25-15 MG/5ML syrup Take 5 mLs by mouth 4 (four) times daily as needed. 09/15/22   Volney American, PA-C      Allergies    Ciprofloxacin, Codeine, Doxycycline, Percocet [oxycodone-acetaminophen], and Tramadol    Review of  Systems   Review of Systems  Constitutional:  Positive for fatigue. Negative for fever.  HENT:  Negative for sore throat.   Eyes:  Negative for visual disturbance.  Respiratory:  Positive for shortness of breath.   Cardiovascular:  Negative for chest pain.  Gastrointestinal:  Positive for abdominal pain, nausea and vomiting.  Genitourinary:  Negative for dysuria.  Skin:  Negative for rash.  Neurological:  Positive for weakness and headaches.    Physical Exam Updated Vital Signs BP (!) 144/89   Pulse (!) 116   Temp 97.9 F (36.6 C)   Resp 18   Wt 68.9 kg   SpO2 100%   BMI 25.29 kg/m  Physical Exam Vitals and nursing note reviewed.  Constitutional:      General: She is not in acute distress.    Appearance: Normal appearance. She is well-developed.  HENT:  Head: Normocephalic and atraumatic.  Eyes:     Conjunctiva/sclera: Conjunctivae normal.  Cardiovascular:     Rate and Rhythm: Normal rate and regular rhythm.     Heart sounds: No murmur heard. Pulmonary:     Effort: Pulmonary effort is normal. No respiratory distress.     Breath sounds: Normal breath sounds.  Abdominal:     Palpations: Abdomen is soft.     Tenderness: There is no abdominal tenderness. There is no guarding or rebound.  Musculoskeletal:        General: Normal range of motion.     Cervical back: Neck supple.     Right lower leg: No edema.     Left lower leg: No edema.  Skin:    General: Skin is warm and dry.     Capillary Refill: Capillary refill takes less than 2 seconds.  Neurological:     General: No focal deficit present.     Mental Status: She is alert.     Sensory: No sensory deficit.     Motor: No weakness.     ED Results / Procedures / Treatments   Labs (all labs ordered are listed, but only abnormal results are displayed) Labs Reviewed  CBC WITH DIFFERENTIAL/PLATELET - Abnormal; Notable for the following components:      Result Value   WBC 12.4 (*)    Neutro Abs 9.5 (*)    Abs  Immature Granulocytes 0.16 (*)    All other components within normal limits  COMPREHENSIVE METABOLIC PANEL - Abnormal; Notable for the following components:   Sodium 131 (*)    Chloride 96 (*)    Glucose, Bld 371 (*)    AST 13 (*)    Total Bilirubin 1.5 (*)    GFR, Estimated 57 (*)    All other components within normal limits  LACTIC ACID, PLASMA - Abnormal; Notable for the following components:   Lactic Acid, Venous 2.2 (*)    All other components within normal limits  URINALYSIS, ROUTINE W REFLEX MICROSCOPIC - Abnormal; Notable for the following components:   Specific Gravity, Urine 1.036 (*)    Glucose, UA >1,000 (*)    Ketones, ur 15 (*)    All other components within normal limits  BRAIN NATRIURETIC PEPTIDE - Abnormal; Notable for the following components:   B Natriuretic Peptide 104.8 (*)    All other components within normal limits  COMPREHENSIVE METABOLIC PANEL - Abnormal; Notable for the following components:   Glucose, Bld 192 (*)    Calcium 8.6 (*)    Total Protein 5.6 (*)    Albumin 3.1 (*)    All other components within normal limits  CBC WITH DIFFERENTIAL/PLATELET - Abnormal; Notable for the following components:   Abs Immature Granulocytes 0.10 (*)    All other components within normal limits  GLUCOSE, CAPILLARY - Abnormal; Notable for the following components:   Glucose-Capillary 187 (*)    All other components within normal limits  CBG MONITORING, ED - Abnormal; Notable for the following components:   Glucose-Capillary 365 (*)    All other components within normal limits  I-STAT VENOUS BLOOD GAS, ED - Abnormal; Notable for the following components:   pCO2, Ven 41.3 (*)    pO2, Ven 23 (*)    Sodium 132 (*)    All other components within normal limits  TROPONIN I (HIGH SENSITIVITY) - Abnormal; Notable for the following components:   Troponin I (High Sensitivity) 27 (*)    All other components  within normal limits  TROPONIN I (HIGH SENSITIVITY) - Abnormal;  Notable for the following components:   Troponin I (High Sensitivity) 20 (*)    All other components within normal limits  LIPASE, BLOOD  LACTIC ACID, PLASMA  PROCALCITONIN  MAGNESIUM  PROCALCITONIN    EKG None  Radiology DG Chest Port 1 View  Result Date: 10/01/2022 CLINICAL DATA:  Weakness. EXAM: PORTABLE CHEST 1 VIEW COMPARISON:  Chest radiograph 09/30/2018 FINDINGS: Stable cardiac and mediastinal contours. Aortic atherosclerosis. No large area pulmonary consolidation. No pleural effusion or pneumothorax. Postsurgical changes overlying the right hemithorax. IMPRESSION: No active disease. Electronically Signed   By: Lovey Newcomer M.D.   On: 10/01/2022 15:34   CT Angio Chest PE W/Cm &/Or Wo Cm  Result Date: 09/30/2022 CLINICAL DATA:  Pulmonary embolus suspected with high probability. EXAM: CT ANGIOGRAPHY CHEST WITH CONTRAST TECHNIQUE: Multidetector CT imaging of the chest was performed using the standard protocol during bolus administration of intravenous contrast. Multiplanar CT image reconstructions and MIPs were obtained to evaluate the vascular anatomy. RADIATION DOSE REDUCTION: This exam was performed according to the departmental dose-optimization program which includes automated exposure control, adjustment of the mA and/or kV according to patient size and/or use of iterative reconstruction technique. CONTRAST:  62m OMNIPAQUE IOHEXOL 350 MG/ML SOLN COMPARISON:  05/27/2021 FINDINGS: Cardiovascular: Motion artifact is present. There is good opacification of the central and segmental pulmonary arteries. No focal filling defects. No evidence of significant pulmonary embolus. Normal heart size. No pericardial effusions. Normal caliber thoracic aorta. Calcification of the aorta and coronary arteries. Mediastinum/Nodes: Esophagus is decompressed. No significant lymphadenopathy. Thyroid gland is unremarkable. Lungs/Pleura: Patchy airspace infiltrates demonstrated throughout both lungs most likely  representing edema. Multifocal pneumonia would be a secondary consideration. No pleural effusions. No pneumothorax. Upper Abdomen: Surgical absence of the gallbladder. No acute abnormalities. Musculoskeletal: Right breast prosthesis with surgical clips in the right axilla. Degenerative changes in the spine. Old left rib fractures. Review of the MIP images confirms the above findings. IMPRESSION: 1. No evidence of significant pulmonary embolus. 2. Patchy airspace disease diffusely throughout both lungs likely representing edema. Multifocal pneumonia could also have this appearance in the appropriate clinical setting. Electronically Signed   By: WLucienne CapersM.D.   On: 09/30/2022 19:17   DG Chest 2 View  Result Date: 09/30/2022 CLINICAL DATA:  Shortness of breath and weakness EXAM: CHEST - 2 VIEW COMPARISON:  Chest x-ray 09/15/2022 FINDINGS: The heart size and mediastinal contours are within normal limits. Both lungs are clear. The visualized skeletal structures are unremarkable. Right axillary surgical clips are again seen. IMPRESSION: No active cardiopulmonary disease. Electronically Signed   By: ARonney AstersM.D.   On: 09/30/2022 15:10    Procedures Procedures    Medications Ordered in ED Medications  acetaminophen (TYLENOL) tablet 650 mg (650 mg Oral Given 10/02/22 0728)  lactated ringers infusion ( Intravenous Infusion Verify 10/02/22 0726)  ondansetron (ZOFRAN) injection 4 mg (has no administration in time range)  amLODipine (NORVASC) tablet 5 mg (5 mg Oral Given 10/02/22 0835)  atorvastatin (LIPITOR) tablet 80 mg (has no administration in time range)  isosorbide mononitrate (IMDUR) 24 hr tablet 30 mg (30 mg Oral Given 10/02/22 0835)  pantoprazole (PROTONIX) EC tablet 40 mg (40 mg Oral Given 10/02/22 0835)  heparin injection 5,000 Units (5,000 Units Subcutaneous Given 10/02/22 0548)  ipratropium-albuterol (DUONEB) 0.5-2.5 (3) MG/3ML nebulizer solution 3 mL (has no administration in time range)   melatonin tablet 10 mg (has no administration in time  range)  hydrALAZINE (APRESOLINE) injection 10 mg (has no administration in time range)  benzonatate (TESSALON) capsule 100 mg (has no administration in time range)  ondansetron (ZOFRAN) injection 4 mg (4 mg Intravenous Given 10/01/22 1532)  lactated ringers bolus 1,000 mL (0 mLs Intravenous Stopped 10/01/22 1642)  cefTRIAXone (ROCEPHIN) 1 g in sodium chloride 0.9 % 100 mL IVPB (1 g Intravenous New Bag/Given 10/01/22 1706)  azithromycin (ZITHROMAX) 500 mg in sodium chloride 0.9 % 250 mL IVPB (0 mg Intravenous Stopped 10/01/22 1935)    ED Course/ Medical Decision Making/ A&P Clinical Course as of 10/02/22 1001  Sat Oct 01, 2022  1529 EKG not crossing into epic.  Sinus tachycardia normal intervals nonspecific T wave changes laterally slightly more prominent than yesterday's EKG [MB]  1538 Chest x-ray interpreted by me as no definite infiltrate.  Awaiting radiology reading. [MB]  1707 Discussed with Dr. Cyd Silence [facility] hospitalist who will put the patient in for a bed either [facility] or [facility].  He asked if we can get a procalcitonin [MB]    Clinical Course User Index [MB] Hayden Rasmussen, MD                             Medical Decision Making Amount and/or Complexity of Data Reviewed Labs: ordered. Radiology: ordered.  Risk Prescription drug management. Decision regarding hospitalization.   This patient complains of general weakness fatigue nausea cough; this involves an extensive number of treatment Options and is a complaint that carries with it a high risk of complications and morbidity. The differential includes viral syndrome, dehydration, medication side effect, pneumonia, sepsis, Sirs  I ordered, reviewed and interpreted labs, which included CBC with elevated white count chemistries low sodium elevated glucose urinalysis without signs of infection, troponins mildly elevated, lactate elevated but cleared, VBG without acidosis, BNP  mildly elevated I ordered medication IV fluids IV antibiotics nausea medication and reviewed PMP when indicated. I ordered imaging studies which included chest x-ray and I independently    visualized and interpreted imaging which showed no definite infiltrate Additional history obtained from patient's husband and daughter Previous records obtained and reviewed in epic including ED visit yesterday I consulted [facility] hospitalist Dr. Cyd Silence and discussed lab and imaging findings and discussed disposition.  Cardiac monitoring reviewed, normal sinus rhythm Social determinants considered, no significant barriers Critical Interventions: None  After the interventions stated above, I reevaluated the patient and found patient to be well-appearing in no distress Admission and further testing considered, no clear etiology identified for patient's symptoms.  She was sent home yesterday and returned less than 24 hours so will err on side of caution and admit her         Final Clinical Impression(s) / ED Diagnoses Final diagnoses:  Dehydration  Nausea and vomiting, unspecified vomiting type  General weakness    Rx / DC Orders ED Discharge Orders     None         Hayden Rasmussen, MD 10/02/22 1009

## 2022-10-01 NOTE — Assessment & Plan Note (Addendum)
Clinically improving with intravenous volume resuscitation  Will transition patient off of intravenous fluids due to increasing hypertension  Hyponatremia resolving  Monitoring potassium levels with serial chemistries.

## 2022-10-02 ENCOUNTER — Observation Stay (HOSPITAL_COMMUNITY): Payer: PPO

## 2022-10-02 DIAGNOSIS — R9389 Abnormal findings on diagnostic imaging of other specified body structures: Secondary | ICD-10-CM

## 2022-10-02 DIAGNOSIS — M79671 Pain in right foot: Secondary | ICD-10-CM | POA: Diagnosis not present

## 2022-10-02 DIAGNOSIS — R519 Headache, unspecified: Secondary | ICD-10-CM | POA: Diagnosis not present

## 2022-10-02 DIAGNOSIS — E871 Hypo-osmolality and hyponatremia: Secondary | ICD-10-CM

## 2022-10-02 DIAGNOSIS — E782 Mixed hyperlipidemia: Secondary | ICD-10-CM

## 2022-10-02 DIAGNOSIS — S92511A Displaced fracture of proximal phalanx of right lesser toe(s), initial encounter for closed fracture: Secondary | ICD-10-CM | POA: Diagnosis present

## 2022-10-02 DIAGNOSIS — E860 Dehydration: Secondary | ICD-10-CM | POA: Diagnosis not present

## 2022-10-02 DIAGNOSIS — T3695XA Adverse effect of unspecified systemic antibiotic, initial encounter: Secondary | ICD-10-CM | POA: Diagnosis not present

## 2022-10-02 DIAGNOSIS — I5032 Chronic diastolic (congestive) heart failure: Secondary | ICD-10-CM | POA: Diagnosis not present

## 2022-10-02 DIAGNOSIS — E1169 Type 2 diabetes mellitus with other specified complication: Secondary | ICD-10-CM

## 2022-10-02 DIAGNOSIS — E872 Acidosis, unspecified: Secondary | ICD-10-CM

## 2022-10-02 LAB — CBC WITH DIFFERENTIAL/PLATELET
Abs Immature Granulocytes: 0.1 10*3/uL — ABNORMAL HIGH (ref 0.00–0.07)
Basophils Absolute: 0 10*3/uL (ref 0.0–0.1)
Basophils Relative: 1 %
Eosinophils Absolute: 0.2 10*3/uL (ref 0.0–0.5)
Eosinophils Relative: 2 %
HCT: 37.7 % (ref 36.0–46.0)
Hemoglobin: 12.9 g/dL (ref 12.0–15.0)
Immature Granulocytes: 1 %
Lymphocytes Relative: 15 %
Lymphs Abs: 1.3 10*3/uL (ref 0.7–4.0)
MCH: 31.2 pg (ref 26.0–34.0)
MCHC: 34.2 g/dL (ref 30.0–36.0)
MCV: 91.1 fL (ref 80.0–100.0)
Monocytes Absolute: 0.7 10*3/uL (ref 0.1–1.0)
Monocytes Relative: 8 %
Neutro Abs: 6.5 10*3/uL (ref 1.7–7.7)
Neutrophils Relative %: 73 %
Platelets: 186 10*3/uL (ref 150–400)
RBC: 4.14 MIL/uL (ref 3.87–5.11)
RDW: 13 % (ref 11.5–15.5)
WBC: 8.8 10*3/uL (ref 4.0–10.5)
nRBC: 0 % (ref 0.0–0.2)

## 2022-10-02 LAB — COMPREHENSIVE METABOLIC PANEL
ALT: 23 U/L (ref 0–44)
AST: 17 U/L (ref 15–41)
Albumin: 3.1 g/dL — ABNORMAL LOW (ref 3.5–5.0)
Alkaline Phosphatase: 53 U/L (ref 38–126)
Anion gap: 11 (ref 5–15)
BUN: 14 mg/dL (ref 8–23)
CO2: 23 mmol/L (ref 22–32)
Calcium: 8.6 mg/dL — ABNORMAL LOW (ref 8.9–10.3)
Chloride: 102 mmol/L (ref 98–111)
Creatinine, Ser: 0.75 mg/dL (ref 0.44–1.00)
GFR, Estimated: >60 mL/min (ref >60)
Glucose, Bld: 192 mg/dL — ABNORMAL HIGH (ref 70–99)
Potassium: 3.9 mmol/L (ref 3.5–5.1)
Sodium: 136 mmol/L (ref 135–145)
Total Bilirubin: 1.2 mg/dL (ref 0.3–1.2)
Total Protein: 5.6 g/dL — ABNORMAL LOW (ref 6.5–8.1)

## 2022-10-02 LAB — ECHOCARDIOGRAM COMPLETE
AR max vel: 2.06 cm2
AV Area VTI: 2.14 cm2
AV Area mean vel: 1.97 cm2
AV Mean grad: 5 mmHg
AV Peak grad: 9.2 mmHg
Ao pk vel: 1.52 m/s
Area-P 1/2: 5.38 cm2
Height: 65 in
P 1/2 time: 395 msec
S' Lateral: 2.2 cm
Single Plane A4C EF: 58.2 %
Weight: 2431.97 oz

## 2022-10-02 LAB — RESPIRATORY PANEL BY PCR

## 2022-10-02 LAB — HEMOGLOBIN A1C
Hgb A1c MFr Bld: 10.4 % — ABNORMAL HIGH (ref 4.8–5.6)
Mean Plasma Glucose: 251.78 mg/dL

## 2022-10-02 LAB — GLUCOSE, CAPILLARY
Glucose-Capillary: 187 mg/dL — ABNORMAL HIGH (ref 70–99)
Glucose-Capillary: 294 mg/dL — ABNORMAL HIGH (ref 70–99)
Glucose-Capillary: 330 mg/dL — ABNORMAL HIGH (ref 70–99)
Glucose-Capillary: 285 mg/dL — ABNORMAL HIGH (ref 70–99)

## 2022-10-02 LAB — PROCALCITONIN
Procalcitonin: <0.1 ng/mL
Procalcitonin: <0.1 ng/mL

## 2022-10-02 LAB — MAGNESIUM: Magnesium: 1.9 mg/dL (ref 1.7–2.4)

## 2022-10-02 MED ORDER — PANTOPRAZOLE SODIUM 40 MG PO TBEC
40.0000 mg | DELAYED_RELEASE_TABLET | Freq: Every day | ORAL | Status: DC
  Administered 2022-10-02 – 2022-10-04 (×3): 40 mg via ORAL
  Filled 2022-10-02 (×3): qty 1

## 2022-10-02 MED ORDER — ISOSORBIDE MONONITRATE ER 30 MG PO TB24
30.0000 mg | ORAL_TABLET | Freq: Every day | ORAL | Status: DC
  Administered 2022-10-02 – 2022-10-04 (×3): 30 mg via ORAL
  Filled 2022-10-02 (×3): qty 1

## 2022-10-02 MED ORDER — IPRATROPIUM-ALBUTEROL 0.5-2.5 (3) MG/3ML IN SOLN: 3.0000 mL | Freq: Four times a day (QID) | RESPIRATORY_TRACT | Status: DC | PRN

## 2022-10-02 MED ORDER — MELATONIN 5 MG PO TABS: 10.0000 mg | ORAL_TABLET | Freq: Every evening | ORAL | Status: DC | PRN

## 2022-10-02 MED ORDER — BENZONATATE 100 MG PO CAPS
100.0000 mg | ORAL_CAPSULE | Freq: Three times a day (TID) | ORAL | Status: DC | PRN
  Administered 2022-10-02: 100 mg via ORAL
  Filled 2022-10-02: qty 1

## 2022-10-02 MED ORDER — ATORVASTATIN CALCIUM 40 MG PO TABS
80.0000 mg | ORAL_TABLET | Freq: Every day | ORAL | Status: DC
  Administered 2022-10-02 – 2022-10-03 (×2): 80 mg via ORAL
  Filled 2022-10-02 (×2): qty 2

## 2022-10-02 MED ORDER — INSULIN ASPART 100 UNIT/ML IJ SOLN
0.0000 [IU] | Freq: Three times a day (TID) | INTRAMUSCULAR | Status: DC
  Administered 2022-10-02: 8 [IU] via SUBCUTANEOUS
  Administered 2022-10-03: 6 [IU] via SUBCUTANEOUS
  Administered 2022-10-03 (×2): 4 [IU] via SUBCUTANEOUS
  Administered 2022-10-04: 2 [IU] via SUBCUTANEOUS

## 2022-10-02 MED ORDER — HEPARIN SODIUM (PORCINE) 5000 UNIT/ML IJ SOLN
5000.0000 [IU] | Freq: Three times a day (TID) | INTRAMUSCULAR | Status: DC
  Administered 2022-10-02 – 2022-10-04 (×7): 5000 [IU] via SUBCUTANEOUS
  Filled 2022-10-02 (×7): qty 1

## 2022-10-02 MED ORDER — KETOROLAC TROMETHAMINE 15 MG/ML IJ SOLN: 15.0000 mg | Freq: Four times a day (QID) | INTRAMUSCULAR | Status: DC | PRN

## 2022-10-02 MED ORDER — AMLODIPINE BESYLATE 5 MG PO TABS
5.0000 mg | ORAL_TABLET | Freq: Every day | ORAL | Status: DC
  Administered 2022-10-02 – 2022-10-04 (×3): 5 mg via ORAL
  Filled 2022-10-02 (×3): qty 1

## 2022-10-02 MED ORDER — ONDANSETRON HCL 4 MG/2ML IJ SOLN
4.0000 mg | Freq: Four times a day (QID) | INTRAMUSCULAR | Status: DC | PRN
  Administered 2022-10-02 – 2022-10-03 (×2): 4 mg via INTRAVENOUS
  Filled 2022-10-02 (×2): qty 2

## 2022-10-02 MED ORDER — KETOROLAC TROMETHAMINE 15 MG/ML IJ SOLN
15.0000 mg | Freq: Once | INTRAMUSCULAR | Status: AC
  Administered 2022-10-02: 15 mg via INTRAVENOUS
  Filled 2022-10-02: qty 1

## 2022-10-02 MED ORDER — HYDRALAZINE HCL 20 MG/ML IJ SOLN: 10.0000 mg | Freq: Four times a day (QID) | INTRAMUSCULAR | Status: DC | PRN

## 2022-10-02 MED ORDER — PERFLUTREN LIPID MICROSPHERE
1.0000 mL | INTRAVENOUS | Status: AC | PRN
  Administered 2022-10-02: 2 mL via INTRAVENOUS

## 2022-10-02 NOTE — Progress Notes (Signed)
  Echocardiogram 2D Echocardiogram has been performed.  [name] 10/02/2022, 4:31 PM

## 2022-10-02 NOTE — Assessment & Plan Note (Signed)
Patient was sent home from emergency room on 1/19 on doxycycline Patient has a known history of intolerance to doxycycline and after taking 1 dose developed a period of severe nausea and vomiting prompting her presentation and admission with dehydration and hyponatremia on 1/20. Holding doxycycline going forward GI distress self-limiting and resolving, advancing diet As needed antiemetics for further symptoms

## 2022-10-02 NOTE — Assessment & Plan Note (Signed)
Patient initially presented to the emergency department with mild lactic acidosis secondary to volume depletion Improved with intravenous volume resuscitation.

## 2022-10-02 NOTE — Assessment & Plan Note (Signed)
Despite abnormal CT imaging of the chest on 1/19, no clinical evidence of cardiogenic volume overload Obtain echocardiogram Strict input and output monitoring Daily weights Low-sodium diet

## 2022-10-02 NOTE — Progress Notes (Signed)
Mobility Specialist - Progress Note   10/02/22 1432  Mobility  Activity Ambulated independently in hallway  Level of Assistance Independent  Assistive Device None  Distance Ambulated (ft) 480 ft  Activity Response Tolerated well  Mobility Referral Yes  $Mobility charge 1 Mobility   Pt received in bed and agreeable to mobility. Pt had some SOB at EOS, but stated she was okay. No complaints during session. Pt to bathroom after session with all needs met & husband in room.  [facility]

## 2022-10-02 NOTE — Assessment & Plan Note (Signed)
Significant right foot bruising and tenderness of the fourth and fifth digit of the right foot. X-ray obtained revealing nondisplaced fracture of the proximal fifth digit of the right foot Placing orders for buddy taping of the fourth and fifth toe as well as right foot protective boot.  Nursing to contact [facility] phone number to perform this.

## 2022-10-02 NOTE — Assessment & Plan Note (Signed)
Patient complaining of severe occipital headache this morning, stating that it is the worst headache in her life Patient denies any history of migraines although review of medical records reveals that patient has a history of repeated migraines and even status migrainosus We will send patient for noncontrast CT imaging of the head to rule out intracranial hemorrhage If CT is negative and patient continues to complain of headache we will provide patient with intravenous Toradol for symptoms and if this is unsuccessful can try combination of Reglan/Benadryl.

## 2022-10-02 NOTE — Progress Notes (Signed)
PROGRESS NOTE   [name]  WVP:[id number] DOB: 1940-11-17 DOA: 10/01/2022 PCP: Collene Leyden, MD   Date of Service: the patient was seen and examined on 10/02/2022  Brief Narrative:  82 year old female with past medical history of hypertension, hyperlipidemia, non-insulin-dependent diabetes mellitus type 2, diastolic congestive heart failure (Echo 05/2021 EF 55-60% with G1DD), asthma, prior stroke, gastroesophageal reflux disease, breast cancer, gout who presented to [facility] emergency department by private vehicle for progressively worsening nausea, vomiting weakness and bodyaches.       Assessment and Plan: * Dehydration with hyponatremia Clinically improving with intravenous volume resuscitation  Will transition patient off of intravenous fluids due to increasing hypertension  Hyponatremia resolving  Monitoring potassium levels with serial chemistries.    Antibiotic reaction, initial encounter Patient was sent home from emergency room on 1/19 on doxycycline Patient has a known history of intolerance to doxycycline and after taking 1 dose developed a period of severe nausea and vomiting prompting her presentation and admission with dehydration and hyponatremia on 1/20. Holding doxycycline going forward GI distress self-limiting and resolving, advancing diet As needed antiemetics for further symptoms  Occipital headache Patient complaining of severe occipital headache this morning, stating that it is the worst headache in her life Patient denies any history of migraines although review of medical records reveals that patient has a history of repeated migraines and even status migrainosus We will send patient for noncontrast CT imaging of the head to rule out intracranial hemorrhage If CT is negative and patient continues to complain of headache we will provide patient with intravenous Toradol for symptoms and if this is unsuccessful can try combination of  Reglan/Benadryl.  Abnormal CT of the chest Patient has been complaining of cough, congestion and mild shortness of breath for over 1 month Patient was diagnosed with influenza A infection the first week of January. Patient been treated with numerous courses of antibiotics over the past 5 weeks including Augmentin, azithromycin, cefdinir, Levaquin and amoxicillin. CT chest performed 1/19 reveals patchy bilateral infiltrates, possibly infectious in origin After admission on 1/20, chest x-ray does not definitively reveal these infiltrates. Procalcitonin unremarkable here, no other clinical evidence of infection. COVID-19/RSV/influenza testing has been negative. No antibiotics indicated at this time Obtaining respiratory viral panel.   Obtaining echocardiogram  As needed bronchodilator therapy If patient continues to be symptomatic we will consider pulmonology consultation  Essential hypertension Markedly elevated blood pressures, possibly secondary to ongoing headaches. Continue amlodipine As needed intravenous hydralazine for markedly elevated blood pressure.  Lactic acidosis Patient initially presented to the emergency department with mild lactic acidosis secondary to volume depletion Improved with intravenous volume resuscitation.  Chronic diastolic CHF (congestive heart failure) (HCC) Despite abnormal CT imaging of the chest on 1/19, no clinical evidence of cardiogenic volume overload Obtain echocardiogram Strict input and output monitoring Daily weights Low-sodium diet   Fracture of proximal phalanx of lesser toe of right foot Significant right foot bruising and tenderness of the fourth and fifth digit of the right foot. X-ray obtained revealing nondisplaced fracture of the proximal fifth digit of the right foot Placing orders for buddy taping of the fourth and fifth toe as well as right foot protective boot.  Nursing to contact [place] phone number to perform this.    Type 2  diabetes mellitus with hyperglycemia (HCC) Hyperglycemia improving.  Hyperglycemia is likely secondary to courses of systemic steroids prescribed during patient's numerous presentations for her shortness of breath over the past month. Patient been placed on  Accu-Cheks before every meal and nightly with sliding scale insulin Holding home regimen of oral hypoglycemics. Hemoglobin A1C pending Diabetic Diet .  Mixed hyperlipidemia due to type 2 diabetes mellitus ([place]) Continuing home regimen of lipid lowering therapy.   GERD without esophagitis Continuing home regimen of daily PPI therapy.         Subjective:  Patient complaining of headache, moderate to severe in intensity, occipital in location, dull in quality, nonradiating.  Patient denies any associated focal weakness.  Physical Exam:  Vitals:   10/02/22 0326 10/02/22 0607 10/02/22 0818 10/02/22 1333  BP: (!) 160/90 (!) 158/85 (!) 173/91 111/67  Pulse: 100 (!) 104 (!) 101 (!) 107  Resp:  '18 20 20  '$ Temp: 97.8 F (36.6 C) 98.4 F (36.9 C) 98.1 F (36.7 C) 98.7 F (37.1 C)  TempSrc: Oral Oral  Oral  SpO2:  96% 98% 97%  Weight: 68.9 kg     Height: '5\' 5"'$  (1.651 m)       Constitutional: Awake alert and oriented x3, in mild distress due to headaches. Skin: Notable ecchymosis of the right foot.  No rashes, no lesions, good skin turgor noted. Eyes: Pupils are equally reactive to light.  No evidence of scleral icterus or conjunctival pallor.  ENMT: Moist mucous membranes noted.  Posterior pharynx clear of any exudate or lesions.   Respiratory: Scattered Bilaterally with very faint bibasilar rales.  No evidence of wheezing.  Normal respiratory effort. No accessory muscle use.  Cardiovascular: Regular rate and rhythm, no murmurs / rubs / gallops. No extremity edema. 2+ pedal pulses. No carotid bruits.  Abdomen: Abdomen is soft and nontender.  No evidence of intra-abdominal masses.  Positive bowel sounds noted in all quadrants.    Musculoskeletal: Significant ecchymosis of the right foot with exquisite pain upon manipulation of the fourth and fifth digit of the right foot.  Joints and other extremities are otherwise unremarkable.   Data Reviewed:  I have personally reviewed and interpreted labs, imaging.  Significant findings are   CBC: Recent Labs  Lab 09/30/22 1333 09/30/22 1626 10/01/22 1455 10/01/22 1549 10/02/22 0729  WBC 11.8*  --  12.4*  --  8.8  NEUTROABS  --   --  9.5*  --  6.5  HGB 15.3* 14.3 14.9 13.9 12.9  HCT 43.2 42.0 41.6 41.0 37.7  MCV 87.8  --  88.3  --  91.1  PLT 244  --  247  --  967   Basic Metabolic Panel: Recent Labs  Lab 09/30/22 1333 09/30/22 1626 09/30/22 2000 10/01/22 1455 10/01/22 1549 10/02/22 0729  NA 134* 134* 137 131* 132* 136  K 4.5 4.3 3.8 4.5 4.7 3.9  CL 98  --  98 96*  --  102  CO2 24  --  27 23  --  23  GLUCOSE 310*  --  174* 371*  --  192*  BUN 27*  --  23 22  --  14  CREATININE 1.21*  --  0.93 0.99  --  0.75  CALCIUM 9.8  --  9.2 9.5  --  8.6*  MG  --   --   --   --   --  1.9   GFR: Estimated Creatinine Clearance: 52.9 mL/min (by C-G formula based on SCr of 0.75 mg/dL). Liver Function Tests: Recent Labs  Lab 09/30/22 1550 10/01/22 1455 10/02/22 0729  AST 14* 13* 17  ALT '26 23 23  '$ ALKPHOS 78 70 53  BILITOT 0.8 1.5* 1.2  PROT 7.6 6.8 5.6*  ALBUMIN 4.2 4.0 3.1*    Coagulation Profile: No results for input(s): "INR", "PROTIME" in the last 168 hours.   EKG: Personally reviewed.  Rhythm is sinus tachycardia with heart rate of 103 bpm.  Evidence of PACs.  No dynamic ST segment changes appreciated.   Code Status:  Full code.  Code status decision has been confirmed with: patient Family Communication: Is at the bedside who has been updated on plan of care.   Severity of Illness:  The appropriate patient status for this patient is OBSERVATION. Observation status is judged to be reasonable and necessary in order to provide the required  intensity of service to ensure the patient's safety. The patient's presenting symptoms, physical exam findings, and initial radiographic and laboratory data in the context of their medical condition is felt to place them at decreased risk for further clinical deterioration. Furthermore, it is anticipated that the patient will be medically stable for discharge from the hospital within 2 midnights of admission.   Time spent:  64 minutes  Author:  Vernelle Emerald MD  10/02/2022 8:23 PM

## 2022-10-02 NOTE — Plan of Care (Deleted)
na

## 2022-10-02 NOTE — Assessment & Plan Note (Signed)
Patient has been complaining of cough, congestion and mild shortness of breath for over 1 month Patient was diagnosed with influenza A infection the first week of January. Patient been treated with numerous courses of antibiotics over the past 5 weeks including Augmentin, azithromycin, cefdinir, Levaquin and amoxicillin. CT chest performed 1/19 reveals patchy bilateral infiltrates, possibly infectious in origin After admission on 1/20, chest x-ray does not definitively reveal these infiltrates. Procalcitonin unremarkable here, no other clinical evidence of infection. COVID-19/RSV/influenza testing has been negative. No antibiotics indicated at this time Obtaining respiratory viral panel.   Obtaining echocardiogram  As needed bronchodilator therapy If patient continues to be symptomatic we will consider pulmonology consultation

## 2022-10-02 NOTE — Progress Notes (Signed)
  Transition of Care [facility]) Screening Note   Patient Details  Name: [name] Date of Birth: 1941-03-26   Transition of Care [facility]) CM/SW Contact:    Henrietta Dine, RN Phone Number: 10/02/2022, 11:33 AM    Transition of Care Department [facility]) has reviewed patient and no TOC needs have been identified at this time. We will continue to monitor patient advancement through interdisciplinary progression rounds. If new patient transition needs arise, please place a TOC consult.

## 2022-10-02 NOTE — Assessment & Plan Note (Signed)
• 

## 2022-10-03 DIAGNOSIS — B338 Other specified viral diseases: Secondary | ICD-10-CM | POA: Diagnosis present

## 2022-10-03 DIAGNOSIS — R531 Weakness: Secondary | ICD-10-CM | POA: Diagnosis present

## 2022-10-03 DIAGNOSIS — I5032 Chronic diastolic (congestive) heart failure: Secondary | ICD-10-CM | POA: Diagnosis present

## 2022-10-03 DIAGNOSIS — F411 Generalized anxiety disorder: Secondary | ICD-10-CM | POA: Diagnosis present

## 2022-10-03 DIAGNOSIS — I700 Atherosclerosis of aorta: Secondary | ICD-10-CM | POA: Diagnosis present

## 2022-10-03 DIAGNOSIS — Z923 Personal history of irradiation: Secondary | ICD-10-CM | POA: Diagnosis not present

## 2022-10-03 DIAGNOSIS — Z853 Personal history of malignant neoplasm of breast: Secondary | ICD-10-CM | POA: Diagnosis not present

## 2022-10-03 DIAGNOSIS — T364X5A Adverse effect of tetracyclines, initial encounter: Secondary | ICD-10-CM | POA: Diagnosis present

## 2022-10-03 DIAGNOSIS — Z794 Long term (current) use of insulin: Secondary | ICD-10-CM | POA: Diagnosis not present

## 2022-10-03 DIAGNOSIS — E871 Hypo-osmolality and hyponatremia: Secondary | ICD-10-CM | POA: Diagnosis present

## 2022-10-03 DIAGNOSIS — J9601 Acute respiratory failure with hypoxia: Secondary | ICD-10-CM | POA: Diagnosis not present

## 2022-10-03 DIAGNOSIS — I110 Hypertensive heart disease with heart failure: Secondary | ICD-10-CM | POA: Diagnosis present

## 2022-10-03 DIAGNOSIS — Z9221 Personal history of antineoplastic chemotherapy: Secondary | ICD-10-CM | POA: Diagnosis not present

## 2022-10-03 DIAGNOSIS — Z7984 Long term (current) use of oral hypoglycemic drugs: Secondary | ICD-10-CM | POA: Diagnosis not present

## 2022-10-03 DIAGNOSIS — E782 Mixed hyperlipidemia: Secondary | ICD-10-CM | POA: Diagnosis present

## 2022-10-03 DIAGNOSIS — J45909 Unspecified asthma, uncomplicated: Secondary | ICD-10-CM | POA: Diagnosis present

## 2022-10-03 DIAGNOSIS — Y929 Unspecified place or not applicable: Secondary | ICD-10-CM | POA: Diagnosis not present

## 2022-10-03 DIAGNOSIS — E872 Acidosis, unspecified: Secondary | ICD-10-CM | POA: Diagnosis present

## 2022-10-03 DIAGNOSIS — K219 Gastro-esophageal reflux disease without esophagitis: Secondary | ICD-10-CM | POA: Diagnosis present

## 2022-10-03 DIAGNOSIS — B974 Respiratory syncytial virus as the cause of diseases classified elsewhere: Secondary | ICD-10-CM | POA: Diagnosis present

## 2022-10-03 DIAGNOSIS — J121 Respiratory syncytial virus pneumonia: Secondary | ICD-10-CM | POA: Diagnosis present

## 2022-10-03 DIAGNOSIS — E1165 Type 2 diabetes mellitus with hyperglycemia: Secondary | ICD-10-CM | POA: Diagnosis present

## 2022-10-03 DIAGNOSIS — Z1152 Encounter for screening for COVID-19: Secondary | ICD-10-CM | POA: Diagnosis not present

## 2022-10-03 DIAGNOSIS — Z9011 Acquired absence of right breast and nipple: Secondary | ICD-10-CM | POA: Diagnosis not present

## 2022-10-03 DIAGNOSIS — Z825 Family history of asthma and other chronic lower respiratory diseases: Secondary | ICD-10-CM | POA: Diagnosis not present

## 2022-10-03 DIAGNOSIS — Z8249 Family history of ischemic heart disease and other diseases of the circulatory system: Secondary | ICD-10-CM | POA: Diagnosis not present

## 2022-10-03 DIAGNOSIS — Z8673 Personal history of transient ischemic attack (TIA), and cerebral infarction without residual deficits: Secondary | ICD-10-CM | POA: Diagnosis not present

## 2022-10-03 DIAGNOSIS — E860 Dehydration: Secondary | ICD-10-CM | POA: Diagnosis present

## 2022-10-03 LAB — CBC WITH DIFFERENTIAL/PLATELET
Abs Immature Granulocytes: 0.07 K/uL (ref 0.00–0.07)
Basophils Absolute: 0 K/uL (ref 0.0–0.1)
Basophils Relative: 1 %
Eosinophils Absolute: 0.1 K/uL (ref 0.0–0.5)
Eosinophils Relative: 2 %
HCT: 34.8 % — ABNORMAL LOW (ref 36.0–46.0)
Hemoglobin: 12 g/dL (ref 12.0–15.0)
Immature Granulocytes: 1 %
Lymphocytes Relative: 15 %
Lymphs Abs: 1 K/uL (ref 0.7–4.0)
MCH: 31.3 pg (ref 26.0–34.0)
MCHC: 34.5 g/dL (ref 30.0–36.0)
MCV: 90.9 fL (ref 80.0–100.0)
Monocytes Absolute: 0.5 K/uL (ref 0.1–1.0)
Monocytes Relative: 7 %
Neutro Abs: 5 K/uL (ref 1.7–7.7)
Neutrophils Relative %: 74 %
Platelets: 172 K/uL (ref 150–400)
RBC: 3.83 MIL/uL — ABNORMAL LOW (ref 3.87–5.11)
RDW: 13 % (ref 11.5–15.5)
WBC: 6.7 K/uL (ref 4.0–10.5)
nRBC: 0 % (ref 0.0–0.2)

## 2022-10-03 LAB — COMPREHENSIVE METABOLIC PANEL
ALT: 22 U/L (ref 0–44)
AST: 18 U/L (ref 15–41)
Albumin: 2.8 g/dL — ABNORMAL LOW (ref 3.5–5.0)
Alkaline Phosphatase: 53 U/L (ref 38–126)
Anion gap: 9 (ref 5–15)
BUN: 14 mg/dL (ref 8–23)
CO2: 23 mmol/L (ref 22–32)
Calcium: 8.5 mg/dL — ABNORMAL LOW (ref 8.9–10.3)
Chloride: 104 mmol/L (ref 98–111)
Creatinine, Ser: 0.87 mg/dL (ref 0.44–1.00)
GFR, Estimated: >60 mL/min (ref >60)
Glucose, Bld: 247 mg/dL — ABNORMAL HIGH (ref 70–99)
Potassium: 3.9 mmol/L (ref 3.5–5.1)
Sodium: 136 mmol/L (ref 135–145)
Total Bilirubin: 1 mg/dL (ref 0.3–1.2)
Total Protein: 5.7 g/dL — ABNORMAL LOW (ref 6.5–8.1)

## 2022-10-03 LAB — GLUCOSE, CAPILLARY
Glucose-Capillary: 244 mg/dL — ABNORMAL HIGH (ref 70–99)
Glucose-Capillary: 260 mg/dL — ABNORMAL HIGH (ref 70–99)
Glucose-Capillary: 209 mg/dL — ABNORMAL HIGH (ref 70–99)
Glucose-Capillary: 260 mg/dL — ABNORMAL HIGH (ref 70–99)

## 2022-10-03 LAB — TSH: TSH: 2.08 u[IU]/mL (ref 0.350–4.500)

## 2022-10-03 LAB — MAGNESIUM: Magnesium: 2.1 mg/dL (ref 1.7–2.4)

## 2022-10-03 MED ORDER — CLOPIDOGREL BISULFATE 75 MG PO TABS
75.0000 mg | ORAL_TABLET | Freq: Every day | ORAL | Status: DC
  Administered 2022-10-03 – 2022-10-04 (×2): 75 mg via ORAL
  Filled 2022-10-03 (×2): qty 1

## 2022-10-03 MED ORDER — GLIPIZIDE 10 MG PO TABS: 5.0000 mg | ORAL_TABLET | Freq: Every day | ORAL | Status: DC

## 2022-10-03 MED ORDER — EMPAGLIFLOZIN 10 MG PO TABS
10.0000 mg | ORAL_TABLET | Freq: Every day | ORAL | Status: DC
  Administered 2022-10-03 – 2022-10-04 (×2): 10 mg via ORAL
  Filled 2022-10-03 (×2): qty 1

## 2022-10-03 MED ORDER — INSULIN GLARGINE-YFGN 100 UNIT/ML ~~LOC~~ SOLN
7.0000 [IU] | Freq: Every day | SUBCUTANEOUS | Status: DC
  Filled 2022-10-03: qty 0.07

## 2022-10-03 NOTE — Progress Notes (Signed)
PROGRESS NOTE [name]  TIR:[id number] DOB: 1941-09-07 DOA: 10/01/2022 PCP: Collene Leyden, MD   Brief Narrative/Hospital Course: [phone] w/ hypertension, hyperlipidemia, NIDDMT2-on glipizide/Jardiance/metformin, diastolic congestive heart failure (Echo 05/2021 EF 55-60% with G1DD), asthma, prior stroke, gerd, breast cancer, gout presented to [facility]  w/ progressively worsening nausea, vomiting weakness and bodyaches. Patient was diagnosed with flu and pneumonia 1/19, discharged on antibiotics but returned on 1/20 with inability to keep down antibiotic and continued symptoms, intolerance to doxycycline Patient was found to have dehydration with hyponatremia lactic acidosis that resolved with IV fluid hydration.  Patient having elevated blood pressure along with elevated blood sugar, respiratory symptoms with vague pulmonary infiltrates on CT chest, procalcitonin reassuring, also having headache.  Respiratory virus panel positive for RSV 1/21.  Diabetes: Has been consulted with uncontrolled hyperglycemia in the setting of acute illness and steroid use. At this time patient is tolerating diet no nausea or vomiting, having bowel movement.  Headache has resolved.  With the reason for her uncontrolled hyperglycemia, RSV infection seems to have improved with mild cough only, not hypoxic on at rest but needing oxygen on ambulation, will qualify for home oxygen. She would like to go home today, discussed plan of care with patient's family on the phone and at the bedside     Subjective: Seen and examined. Overnight patient has been afebrile on room air BP somewhat elevated in 170s Labs reviewed fairly stable CMP CBC but blood sugar in 240s to 280s  Assessment and Plan: Principal Problem:   Dehydration with hyponatremia Active Problems:   Antibiotic reaction, initial encounter   Occipital headache   Essential hypertension   Abnormal CT of the chest   Lactic acidosis   Chronic diastolic CHF  (congestive heart failure) (HCC)   Fracture of proximal phalanx of lesser toe of right foot   Type 2 diabetes mellitus with hyperglycemia (HCC)   Mixed hyperlipidemia due to type 2 diabetes mellitus (HCC)   GERD without esophagitis   RSV infection  Dehydration Hyponatremia Antibody reaction with vomiting intolerance to doxycycline Lactic acidosis due to volume depletion: BP stable, afebrile.  BUN/creatinine and sodium stable.  Lactic acidosis resolved.  Has had bowel movement tolerating diet.  Type 2 diabetes mellitus with uncontrolled hyperglycemia OHA on hold.  Has not recently steroid use contributing.  Resumed home glipizide and Jardiance 11/22>reports she was not taking metformin but will talk to her PCP about resuming soon.  If blood sugar remains controlled on this regimen we can discharge her otherwise we will need to put her on long-acting insulin patient verbalized understanding continue to trend blood sugar 10.4 A1c indicates poor control outpatient.Advised to check ACHS sugar at home> she has not been doing that before. Recent Labs  Lab 10/02/22 0729 10/02/22 0825 10/02/22 1649 10/02/22 2150 10/03/22 0752 10/03/22 1157 10/03/22 1641  GLUCAP  --    < > 330* 285* 244* 260* 209*  HGBA1C 10.4*  --   --   --   --   --   --    < > = values in this interval not displayed.    Occipital headache: CT head obtained 1/21 with no acute finding.  Managed with Toradol as needed/Tylenol.  Headache has improved Hypertension on amlodipine 5 mg HLD on Lipitor 80 mg GERD on PPI  Abnormal CT chest Recent influenza infection on the first of January RSV +1/21 Acute hypoxic respiratory failure now with nocturnal hypoxia doing well on room air at rest but needing oxygen  on ambulation CT chest 1/19 patchy bilateral infiltrates, chest x-ray 1/20 no definite infiltrates, procalcitonin unremarkable ruling out bacterial infection COVID-19 negative but RSV positive holding off on antibiotics,  patient is afebrile no leukocytosis, continue symptomatic management.  Echo completed 1/21.She will need home o2 as her spo2 was low in 80s, and was at 86% in ambulation. Wil lask CM to set up home pulse oximeter  Chronic diastolic CHF: Initially needing fluid restriction for dehydration.  Echo showed EF 70 to 75%, G1 DD normal RV function size, RV SP 29, mitral valve aortic valve no acute finding. Volume status stable, her weight stable as below Filed Weights   10/01/22 1443 10/02/22 0326 10/03/22 0610  Weight: 68.9 kg 68.9 kg 69.1 kg    Fracture of proximal phalanx of lesser toe of right foot:significant right foot bruising and tenderness of the fourth and fifth digit of the right foot.X-ray obtained revealing nondisplaced fracture of the proximal fifth digit of the right foot. Cont buddy taping of the fourth and fifth toe as well as right foot protective boot. She has boot placed. sHe has not pain.  DVT prophylaxis: heparin injection 5,000 Units Start: 10/02/22 0600 SCDs Start: 10/02/22 0357 Code Status:   Code Status: Full Code Family Communication: plan of care discussed with patient/DAUGHTER On phone  Patient status is: Admitted observation but remains hospitalized for ongoing management of uncontrolled hyperglycemia hypoxia Level of care: Telemetry  Dispo: The patient is from: home            Anticipated disposition: home tomorrow  Objective: Vitals last 24 hrs: Vitals:   10/03/22 0610 10/03/22 1134 10/03/22 1300 10/03/22 1335  BP: (!) 172/82   (!) 151/82  Pulse: 72   (!) 104  Resp: 18   16  Temp: 98.3 F (36.8 C)   98.6 F (37 C)  TempSrc: Oral   Oral  SpO2: 98% 96% (!) 86% 97%  Weight: 69.1 kg     Height:       Weight change: 0.153 kg  Physical Examination: General exam: alert awake, older than stated age HEENT:Oral mucosa moist, Ear/Nose WNL grossly Respiratory system: bilaterally clear BS, no use of accessory muscle Cardiovascular system: S1 & S2 +, No  JVD. Gastrointestinal system: Abdomen soft,NT,ND, BS+ Nervous System:Alert, awake, moving extremities. Extremities: LE edema ,distal peripheral pulses palpable.  Skin: No rashes,no icterus. MSK: Normal muscle bulk,tone, power  Medications reviewed: neg Scheduled Meds:  amLODipine  5 mg Oral Daily   atorvastatin  80 mg Oral q1800   clopidogrel  75 mg Oral Daily   empagliflozin  10 mg Oral Daily   [START ON 10/04/2022] glipiZIDE  5 mg Oral QAC breakfast   heparin  5,000 Units Subcutaneous Q8H   insulin aspart  0-10 Units Subcutaneous TID WC   isosorbide mononitrate  30 mg Oral Daily   pantoprazole  40 mg Oral Daily  Continuous Infusions:   Diet Order             Diet heart healthy/carb modified Room service appropriate? Yes; Fluid consistency: Thin  Diet effective now                  Intake/Output Summary (Last 24 hours) at 10/03/2022 1703 Last data filed at 10/02/2022 1801 Gross per 24 hour  Intake 709 ml  Output --  Net 709 ml   Net IO Since Admission: 2,471.41 mL [10/03/22 1703]  Wt Readings from Last 3 Encounters:  10/03/22 69.1 kg  09/30/22 68.9 kg  09/23/22 68.9 kg     Unresulted Labs (From admission, onward)    None      Data Reviewed: I have personally reviewed following labs and imaging studies CBC: Recent Labs  Lab 09/30/22 1333 09/30/22 1626 10/01/22 1455 10/01/22 1549 10/02/22 0729 10/03/22 0658  WBC 11.8*  --  12.4*  --  8.8 6.7  NEUTROABS  --   --  9.5*  --  6.5 5.0  HGB 15.3* 14.3 14.9 13.9 12.9 12.0  HCT 43.2 42.0 41.6 41.0 37.7 34.8*  MCV 87.8  --  88.3  --  91.1 90.9  PLT 244  --  247  --  186 502   Basic Metabolic Panel: Recent Labs  Lab 09/30/22 1333 09/30/22 1626 09/30/22 2000 10/01/22 1455 10/01/22 1549 10/02/22 0729 10/03/22 0658  NA 134*   < > 137 131* 132* 136 136  K 4.5   < > 3.8 4.5 4.7 3.9 3.9  CL 98  --  98 96*  --  102 104  CO2 24  --  27 23  --  23 23  GLUCOSE 310*  --  174* 371*  --  192* 247*  BUN 27*  --   23 22  --  14 14  CREATININE 1.21*  --  0.93 0.99  --  0.75 0.87  CALCIUM 9.8  --  9.2 9.5  --  8.6* 8.5*  MG  --   --   --   --   --  1.9 2.1   < > = values in this interval not displayed.  GFR: Estimated Creatinine Clearance: 48.6 mL/min (by C-G formula based on SCr of 0.87 mg/dL). Liver Function Tests: Recent Labs  Lab 09/30/22 1550 10/01/22 1455 10/02/22 0729 10/03/22 0658  AST 14* 13* 17 18  ALT '26 23 23 22  '$ ALKPHOS 78 70 53 53  BILITOT 0.8 1.5* 1.2 1.0  PROT 7.6 6.8 5.6* 5.7*  ALBUMIN 4.2 4.0 3.1* 2.8*   Recent Labs  Lab 09/30/22 1550 10/01/22 1455  LIPASE 23 23   Recent Labs    10/03/22 0658  TSH 2.080   Sepsis Labs: Recent Labs  Lab 09/30/22 1550 10/01/22 1610 10/01/22 1705 10/01/22 1719 10/02/22 0729  PROCALCITON  --   --  <0.10  --  <0.10  LATICACIDVEN 1.4 2.2*  --  1.9  --     Recent Results (from the past 240 hour(s))  Resp panel by RT-PCR (RSV, Flu A&B, Covid) Nasopharyngeal Swab     Status: None   Collection Time: 09/30/22  1:33 PM   Specimen: Nasopharyngeal Swab; Nasal Swab  Result Value Ref Range Status   SARS Coronavirus 2 by RT PCR NEGATIVE NEGATIVE Final    Comment: (NOTE) SARS-CoV-2 target nucleic acids are NOT DETECTED.  The SARS-CoV-2 RNA is generally detectable in upper respiratory specimens during the acute phase of infection. The lowest concentration of SARS-CoV-2 viral copies this assay can detect is 138 copies/mL. A negative result does not preclude SARS-Cov-2 infection and should not be used as the sole basis for treatment or other patient management decisions. A negative result may occur with  improper specimen collection/handling, submission of specimen other than nasopharyngeal swab, presence of viral mutation(s) within the areas targeted by this assay, and inadequate number of viral copies(<138 copies/mL). A negative result must be combined with clinical observations, patient history, and epidemiological information. The  expected result is Negative.  Fact Sheet for Patients:  EntrepreneurPulse.com.au  Fact Sheet for Healthcare Providers:  IncredibleEmployment.be  This test is no t yet approved or cleared by the [place] and  has been authorized for detection and/or diagnosis of SARS-CoV-2 by FDA under an Emergency Use Authorization (EUA). This EUA will remain  in effect (meaning this test can be used) for the duration of the COVID-19 declaration under Section 564(b)(1) of the Act, 21 U.S.C.section 360bbb-3(b)(1), unless the authorization is terminated  or revoked sooner.       Influenza A by PCR NEGATIVE NEGATIVE Final   Influenza B by PCR NEGATIVE NEGATIVE Final    Comment: (NOTE) The Xpert Xpress SARS-CoV-2/FLU/RSV plus assay is intended as an aid in the diagnosis of influenza from Nasopharyngeal swab specimens and should not be used as a sole basis for treatment. Nasal washings and aspirates are unacceptable for Xpert Xpress SARS-CoV-2/FLU/RSV testing.  Fact Sheet for Patients: EntrepreneurPulse.com.au  Fact Sheet for Healthcare Providers: IncredibleEmployment.be  This test is not yet approved or cleared by the [place] FDA and has been authorized for detection and/or diagnosis of SARS-CoV-2 by FDA under an Emergency Use Authorization (EUA). This EUA will remain in effect (meaning this test can be used) for the duration of the COVID-19 declaration under Section 564(b)(1) of the Act, 21 U.S.C. section 360bbb-3(b)(1), unless the authorization is terminated or revoked.     Resp Syncytial Virus by PCR NEGATIVE NEGATIVE Final    Comment: (NOTE) Fact Sheet for Patients: EntrepreneurPulse.com.au  Fact Sheet for Healthcare Providers: IncredibleEmployment.be  This test is not yet approved or cleared by the [place] FDA and has been authorized for detection and/or  diagnosis of SARS-CoV-2 by FDA under an Emergency Use Authorization (EUA). This EUA will remain in effect (meaning this test can be used) for the duration of the COVID-19 declaration under Section 564(b)(1) of the Act, 21 U.S.C. section 360bbb-3(b)(1), unless the authorization is terminated or revoked.  Performed at [facility], [address], [place], [place] [address]   Culture, blood (routine x 2)     Status: None (Preliminary result)   Collection Time: 09/30/22  8:00 PM   Specimen: BLOOD  Result Value Ref Range Status   Specimen Description BLOOD RIGHT ANTECUBITAL  Final   Special Requests   Final    BOTTLES DRAWN AEROBIC AND ANAEROBIC Blood Culture adequate volume   Culture   Final    NO GROWTH 3 DAYS Performed at [facility], [place] [address], [place], [place] [address]    Report Status PENDING  Incomplete  Culture, blood (routine x 2)     Status: None (Preliminary result)   Collection Time: 09/30/22  8:00 PM   Specimen: BLOOD  Result Value Ref Range Status   Specimen Description BLOOD LEFT ANTECUBITAL  Final   Special Requests   Final    BOTTLES DRAWN AEROBIC AND ANAEROBIC Blood Culture adequate volume   Culture   Final    NO GROWTH 3 DAYS Performed at [facility], [place] [address] [place], [place] [address]    Report Status PENDING  Incomplete  Respiratory (~20 pathogens) panel by PCR     Status: Abnormal   Collection Time: 10/02/22 11:16 AM   Specimen: Nasopharyngeal Swab; Respiratory  Result Value Ref Range Status   Adenovirus NOT DETECTED NOT DETECTED Final   Coronavirus 229E NOT DETECTED NOT DETECTED Final    Comment: (NOTE) The Coronavirus on the Respiratory Panel, DOES NOT test for the novel  Coronavirus (2019 nCoV)    Coronavirus HKU1 NOT DETECTED  NOT DETECTED Final   Coronavirus NL63 NOT DETECTED NOT DETECTED Final   Coronavirus OC43 NOT DETECTED NOT DETECTED Final   Metapneumovirus NOT DETECTED NOT DETECTED Final    Rhinovirus / Enterovirus NOT DETECTED NOT DETECTED Final   Influenza A NOT DETECTED NOT DETECTED Final   Influenza B NOT DETECTED NOT DETECTED Final   Parainfluenza Virus 1 NOT DETECTED NOT DETECTED Final   Parainfluenza Virus 2 NOT DETECTED NOT DETECTED Final   Parainfluenza Virus 3 NOT DETECTED NOT DETECTED Final   Parainfluenza Virus 4 NOT DETECTED NOT DETECTED Final   Respiratory Syncytial Virus DETECTED (A) NOT DETECTED Final   Bordetella pertussis NOT DETECTED NOT DETECTED Final   Bordetella Parapertussis NOT DETECTED NOT DETECTED Final   Chlamydophila pneumoniae NOT DETECTED NOT DETECTED Final   Mycoplasma pneumoniae NOT DETECTED NOT DETECTED Final    Comment: Performed at [facility], [place] [address] [place], [place] [address]    Antimicrobials: Anti-infectives (From admission, onward)    Start     Dose/Rate Route Frequency Ordered Stop   10/01/22 1645  cefTRIAXone (ROCEPHIN) 1 g in sodium chloride 0.9 % 100 mL IVPB        1 g 200 mL/hr over 30 Minutes Intravenous  Once 10/01/22 1633 10/01/22 1736   10/01/22 1645  azithromycin (ZITHROMAX) 500 mg in sodium chloride 0.9 % 250 mL IVPB        500 mg 250 mL/hr over 60 Minutes Intravenous  Once 10/01/22 1633 10/01/22 1935      Culture/Microbiology    Component Value Date/Time   SDES BLOOD RIGHT ANTECUBITAL 09/30/2022 2000   SDES BLOOD LEFT ANTECUBITAL 09/30/2022 2000   SPECREQUEST  09/30/2022 2000    BOTTLES DRAWN AEROBIC AND ANAEROBIC Blood Culture adequate volume   SPECREQUEST  09/30/2022 2000    BOTTLES DRAWN AEROBIC AND ANAEROBIC Blood Culture adequate volume   CULT  09/30/2022 2000    NO GROWTH 3 DAYS Performed at [facility], [place] [address] [place], [place] [address]    CULT  09/30/2022 2000    NO GROWTH 3 DAYS Performed at [facility] [address], [place], [place] [address]    REPTSTATUS PENDING 09/30/2022 2000   REPTSTATUS PENDING 09/30/2022 2000    Other culture-see note  Radiology  Studies: ECHOCARDIOGRAM COMPLETE  Result Date: 10/02/2022    ECHOCARDIOGRAM REPORT   Patient Name:   [name] Date of Exam: 10/02/2022 Medical Rec #:  [id number]     Height:       65.0 in Accession #:    [id number]    Weight:       152.0 lb Date of Birth:  Dec 02, 1940      BSA:          1.760 m Patient Age:    44 years      BP:           111/67 mmHg Patient Gender: F             HR:           102 bpm. Exam Location:  Inpatient Procedure: 2D Echo and Intracardiac Opacification Agent Indications:    CHF  History:        Patient has prior history of Echocardiogram examinations, most                 recent 06/08/2021. Risk Factors:Diabetes, Hypertension and  Dyslipidemia.  Sonographer:    Harvie Junior Referring Phys: [id number] Vernelle Emerald  Sonographer Comments: Technically difficult study due to poor echo windows. Image acquisition challenging due to respiratory motion. IMPRESSIONS  1. Left ventricular ejection fraction, by estimation, is 70 to 75%. The left ventricle has hyperdynamic function. The left ventricle has no regional wall motion abnormalities. There is mild left ventricular hypertrophy. Left ventricular diastolic parameters are consistent with Grade I diastolic dysfunction (impaired relaxation).  2. Right ventricular systolic function is normal. The right ventricular size is normal. There is normal pulmonary artery systolic pressure. The estimated right ventricular systolic pressure is 56.2 mmHg.  3. The mitral valve is normal in structure. No evidence of mitral valve regurgitation. No evidence of mitral stenosis.  4. The aortic valve is tricuspid. There is mild calcification of the aortic valve. Aortic valve regurgitation is mild. Aortic valve sclerosis/calcification is present, without any evidence of aortic stenosis. Aortic regurgitation PHT measures 395 msec. Aortic valve mean gradient measures 5.0 mmHg. Aortic valve Vmax measures 1.52 m/s.  5. The inferior vena cava is normal in  size with greater than 50% respiratory variability, suggesting right atrial pressure of 3 mmHg. Comparison(s): Prior images reviewed side by side. FINDINGS  Left Ventricle: Left ventricular ejection fraction, by estimation, is 70 to 75%. The left ventricle has hyperdynamic function. The left ventricle has no regional wall motion abnormalities. Definity contrast agent was given IV to delineate the left ventricular endocardial borders. The left ventricular internal cavity size was normal in size. There is mild left ventricular hypertrophy. Left ventricular diastolic parameters are consistent with Grade I diastolic dysfunction (impaired relaxation). Right Ventricle: The right ventricular size is normal. No increase in right ventricular wall thickness. Right ventricular systolic function is normal. There is normal pulmonary artery systolic pressure. The tricuspid regurgitant velocity is 2.59 m/s, and  with an assumed right atrial pressure of 3 mmHg, the estimated right ventricular systolic pressure is 56.3 mmHg. Left Atrium: Left atrial size was normal in size. Right Atrium: Right atrial size was normal in size. Pericardium: There is no evidence of pericardial effusion. Mitral Valve: The mitral valve is normal in structure. No evidence of mitral valve regurgitation. No evidence of mitral valve stenosis. Tricuspid Valve: The tricuspid valve is normal in structure. Tricuspid valve regurgitation is trivial. No evidence of tricuspid stenosis. Aortic Valve: The aortic valve is tricuspid. There is mild calcification of the aortic valve. Aortic valve regurgitation is mild. Aortic regurgitation PHT measures 395 msec. Aortic valve sclerosis/calcification is present, without any evidence of aortic stenosis. Aortic valve mean gradient measures 5.0 mmHg. Aortic valve peak gradient measures 9.2 mmHg. Aortic valve area, by VTI measures 2.14 cm. Pulmonic Valve: The pulmonic valve was normal in structure. Pulmonic valve regurgitation  is trivial. No evidence of pulmonic stenosis. Aorta: The aortic root is normal in size and structure. Venous: The inferior vena cava is normal in size with greater than 50% respiratory variability, suggesting right atrial pressure of 3 mmHg. IAS/Shunts: No atrial level shunt detected by color flow Doppler.  LEFT VENTRICLE PLAX 2D LVIDd:         3.10 cm     Diastology LVIDs:         2.20 cm     LV e' medial:    5.44 cm/s LV PW:         1.10 cm     LV E/e' medial:  9.4 LV IVS:        1.10 cm  LV e' lateral:   7.94 cm/s LVOT diam:     2.00 cm     LV E/e' lateral: 6.4 LV SV:         47 LV SV Index:   27 LVOT Area:     3.14 cm  LV Volumes (MOD) LV vol d, MOD A4C: 40.4 ml LV vol s, MOD A4C: 16.9 ml LV SV MOD A4C:     40.4 ml RIGHT VENTRICLE RV Basal diam:  2.60 cm RV Mid diam:    2.20 cm RV S prime:     16.20 cm/s TAPSE (M-mode): 1.8 cm LEFT ATRIUM             Index        RIGHT ATRIUM           Index LA diam:        2.10 cm 1.19 cm/m   RA Area:     10.10 cm LA Vol (A2C):   27.6 ml 15.68 ml/m  RA Volume:   18.00 ml  10.23 ml/m LA Vol (A4C):   23.4 ml 13.29 ml/m LA Biplane Vol: 25.6 ml 14.54 ml/m  AORTIC VALVE                     PULMONIC VALVE AV Area (Vmax):    2.06 cm      PV Vmax:       1.19 m/s AV Area (Vmean):   1.97 cm      PV Peak grad:  5.7 mmHg AV Area (VTI):     2.14 cm AV Vmax:           151.50 cm/s AV Vmean:          107.000 cm/s AV VTI:            0.220 m AV Peak Grad:      9.2 mmHg AV Mean Grad:      5.0 mmHg LVOT Vmax:         99.20 cm/s LVOT Vmean:        67.000 cm/s LVOT VTI:          0.150 m LVOT/AV VTI ratio: 0.68 AI PHT:            395 msec  AORTA Ao Root diam: 2.80 cm Ao Asc diam:  3.00 cm MITRAL VALVE                TRICUSPID VALVE MV Area (PHT): 5.38 cm     TR Peak grad:   26.8 mmHg MV Decel Time: 141 msec     TR Vmax:        259.00 cm/s MV E velocity: 51.00 cm/s MV A velocity: 101.00 cm/s  SHUNTS MV E/A ratio:  0.50         Systemic VTI:  0.15 m                             Systemic  Diam: 2.00 cm Candee Furbish MD Electronically signed by Candee Furbish MD Signature Date/Time: 10/02/2022/4:37:07 PM    Final    CT HEAD WO CONTRAST (5MM)  Result Date: 10/02/2022 CLINICAL DATA:  Severe sudden headache. EXAM: CT HEAD WITHOUT CONTRAST TECHNIQUE: Contiguous axial images were obtained from the base of the skull through the vertex without intravenous contrast. RADIATION DOSE REDUCTION: This exam was performed according to the departmental dose-optimization program which includes automated exposure control, adjustment of the  mA and/or kV according to patient size and/or use of iterative reconstruction technique. COMPARISON:  03/30/2016 FINDINGS: Brain: There is no evidence for acute hemorrhage, hydrocephalus, mass lesion, or abnormal extra-axial fluid collection. No definite CT evidence for acute infarction. Diffuse loss of parenchymal volume is consistent with atrophy. Patchy low attenuation in the deep hemispheric and periventricular white matter is nonspecific, but likely reflects chronic microvascular ischemic demyelination. Vascular: No hyperdense vessel or unexpected calcification. Skull: No evidence for fracture. No worrisome lytic or sclerotic lesion. Sinuses/Orbits: The visualized paranasal sinuses and mastoid air cells are clear. Visualized portions of the globes and intraorbital fat are unremarkable. Other: None. IMPRESSION: 1. No acute intracranial abnormality. 2. Atrophy with chronic small vessel ischemic disease. Electronically Signed   By: Misty Stanley M.D.   On: 10/02/2022 13:18   DG Foot Complete Right  Result Date: 10/02/2022 CLINICAL DATA:  Right foot pain. EXAM: RIGHT FOOT COMPLETE - 3+ VIEW COMPARISON:  Right foot radiographs 03/19/2020 FINDINGS: There is again diffuse decreased bone mineralization. Mild-to-moderate hallux valgus. Redemonstration of partial resection of the distal medial first metatarsal, bunionectomy. Mild great toe metatarsophalangeal joint space narrowing. New  transverse lucency within the proximal metaphysis of the proximal phalanx of the fifth toe, an acute to subacute nondisplaced fracture. Mild irregularity of the second and third metatarsal bases unchanged from prior and may represent old healed fractures versus at least peripheral degenerative osteophytes. Moderate second and third tarsometatarsal joint space narrowing. Mild-to-moderate plantar and mild posterior calcaneal heel spurs. IMPRESSION: 1. Acute to subacute nondisplaced fracture of the proximal metaphysis of the proximal phalanx of the fifth toe. 2. Mild-to-moderate hallux valgus. 3. Moderate second and third tarsometatarsal osteoarthritis. Electronically Signed   By: Yvonne Kendall M.D.   On: 10/02/2022 12:35     LOS: 0 days   Antonieta Pert, MD [facility]  10/03/2022, 5:03 PM

## 2022-10-03 NOTE — Progress Notes (Signed)
Orthopedic Tech Progress Note Patient Details:  [name] [birth year] [id number]  Patient ID: [name], female   DOB: [birth year], 82 y.o.   MRN: [id number]  [name] 10/03/2022, 10:14 AM Right post op shoe applied . Right 4th and 5th toes buddy tape

## 2022-10-03 NOTE — Discharge Instructions (Signed)
American Diabetes Association www.diabetes.org

## 2022-10-03 NOTE — Inpatient Diabetes Management (Addendum)
Inpatient Diabetes Program Recommendations  AACE/ADA: New Consensus Statement on Inpatient Glycemic Control (2015)  Target Ranges:  Prepandial:   less than 140 mg/dL      Peak postprandial:   less than 180 mg/dL (1-2 hours)      Critically ill patients:  140 - 180 mg/dL    Latest Reference Range & Units 10/02/22 07:29  Hemoglobin A1C 4.8 - 5.6 % 10.4 (H)  251 mg/dl  (H): Data is abnormally high  Latest Reference Range & Units 10/02/22 08:25 10/02/22 11:06 10/02/22 16:49 10/02/22 21:50  Glucose-Capillary 70 - 99 mg/dL 187 (H) 294 (H) 330 (H)  8 units Novolog  285 (H)  (H): Data is abnormally high  Latest Reference Range & Units 10/03/22 07:52  Glucose-Capillary 70 - 99 mg/dL 244 (H)  (H): Data is abnormally high    Admit with:  Dehydration with hyponatremia  Antibiotic reaction, initial encounter  Abnormal CT of the chest   History: DM, CHF  Home DM Meds: Glipizide 5 mg daily        Jardiance 10 mg daily        Metformin 500 mg BID  Current Orders: Novolog 0-10 units TID   Pt given Rx for Prednisone at [facility] 09/15/2022 (40 mg daily for 10 days) Pt was given 10 mg Decadron IM X 1 dose at UC visit 09/20/2022 These Steroids likely had an affect on pt's current A1c PCP listed as Dr. Esmeralda Links with [facility]  Note Novolog SSI started last PM   MD- Since oral Diabetes meds are on hold and AM CBG elevated, please consider adding low dose Basal insulin to hospital regimen:  Semglee 7 units Daily (0.1 units/kg)    Addendum 1:20pm--Met w/ pt and husband at bedside.  Pt told me she is going home today.  Has been sick for weeks and has both the Flu and now RSV.  Spoke with patient about her current A1c of 10.4%--Explained to pt that all her recent illnesses and rounds of Steroids have likely affected her A1c.  Explained what an A1c is and what it measures.  Reminded patient that her goal A1c is 7% or less per ADA standards to prevent both acute and long-term complications--Encouraged  follow up with PCP after d/c.  Explained to patient the extreme importance of good glucose control at home.  Encouraged patient to check her CBGs at least bid at home (fasting and another check within the day) and to record all CBGs in a logbook for her PCP to review.     --Will follow patient during hospitalization--  Wyn Quaker RN, MSN, [place] Diabetes Coordinator Inpatient Glycemic Control Team Team Pager: [phone] (8a-5p)

## 2022-10-03 NOTE — TOC Initial Note (Signed)
Transition of Care [facility]) - Initial/Assessment Note    Patient Details  Name: FEMALE IAFRATE MRN: [id number] Date of Birth: 1941-07-23  [facility]) CM/SW Contact:    Vassie Moselle, LCSW Phone Number: 10/03/2022, 3:37 PM  Clinical Narrative:                 Spoke with pt via t/c to room to discuss recommendation for home O2. Pt is agreeable to have O2 delivered. O2 has been ordered through [facility] and will be delivered to pt's room prior to discharge. [facility] will meet pt at home to deliver further O2 DME.   Expected Discharge Plan: Home/Self Care Barriers to Discharge: No Barriers Identified   Patient Goals and CMS Choice Patient states their goals for this hospitalization and ongoing recovery are:: To return home CMS Medicare.gov Compare Post Acute Care list provided to:: Patient Choice offered to / list presented to : Patient      Expected Discharge Plan and Services In-house Referral: Clinical Social Work Discharge Planning Services: CM Consult Post Acute Care Choice: Durable Medical Equipment Living arrangements for the past 2 months: Single Family Home                 DME Arranged: Oxygen DME Agency: [employer] [facility] Date DME Agency Contacted: 10/03/22 Time DME Agency Contacted: [phone] Representative spoke with at DME Agency: Caryl Pina            Prior Living Arrangements/Services Living arrangements for the past 2 months: [place] Lives with:: Spouse Patient language and need for interpreter reviewed:: Yes Do you feel safe going back to the place where you live?: Yes      Need for Family Participation in Patient Care: No (Comment) Care giver support system in place?: No (comment) Current home services: DME Criminal Activity/Legal Involvement Pertinent to Current Situation/Hospitalization: No - Comment as needed  Activities of Daily Living Home Assistive Devices/Equipment: Eyeglasses ADL Screening (condition at time of admission) Patient's  cognitive ability adequate to safely complete daily activities?: Yes Is the patient deaf or have difficulty hearing?: No Does the patient have difficulty seeing, even when wearing glasses/contacts?: No Does the patient have difficulty concentrating, remembering, or making decisions?: No Patient able to express need for assistance with ADLs?: No Does the patient have difficulty dressing or bathing?: No Independently performs ADLs?: Yes (appropriate for developmental age) Does the patient have difficulty walking or climbing stairs?: Yes Weakness of Legs: Both Weakness of Arms/Hands: Both  Permission Sought/Granted   Permission granted to share information with : No              Emotional Assessment   Attitude/Demeanor/Rapport: Engaged Affect (typically observed): Accepting Orientation: : Oriented to Self, Oriented to Place, Oriented to  Time, Oriented to Situation Alcohol / Substance Use: Not Applicable Psych Involvement: No (comment)  Admission diagnosis:  Generalized weakness [R53.1] Patient Active Problem List   Diagnosis Date Noted   Fracture of proximal phalanx of lesser toe of right foot 10/02/2022   Chronic diastolic CHF (congestive heart failure) ([place]) 10/02/2022   Lactic acidosis 10/02/2022   Mixed hyperlipidemia due to type 2 diabetes mellitus ([place]) 10/02/2022   Antibiotic reaction, initial encounter 10/02/2022   Abnormal CT of the chest 10/02/2022   Dehydration with hyponatremia 10/01/2022   Type 2 diabetes mellitus with hyperglycemia ([place]) 10/01/2022   Asthma exacerbation 05/25/2021   Angina pectoris ([place])    History of stroke 06/13/2017   DOE (dyspnea on exertion) 05/24/2017  Occipital neuralgia of right side 11/02/2016   Palpitations 06/17/2015   Cerebrovascular accident (CVA) due to occlusion of left middle cerebral artery ([place]) 06/17/2015   Hyperlipidemia 06/17/2015   HLD (hyperlipidemia)    Intracranial vascular stenosis    Cerebral infarction due to  occlusion of left middle cerebral artery (HCC)    Occipital headache    Extrinsic asthma 07/02/2014   Migraine variant with status migrainosus 07/02/2014   Benign paroxysmal positional vertigo 07/02/2014   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/2014   Essential hypertension 03/03/2014   Mild aortic insufficiency    H/O chest pain    Sinus bradycardia on ECG    GERD without esophagitis 03/24/2008   ADENOCARCINOMA, BREAST, RIGHT 06/30/2007   ASTHMA 06/30/2007   Upper airway cough syndrome 06/30/2007   PCP:  Collene Leyden, MD Pharmacy:   [id number] [place], [place]Phone: [phone]: [address] [address]Phone: [phone]: [address]Phone: [phone]: [phone]- [employer], [place]Phone: [phone]: [phone]    Social Determinants of Health (SDOH) Social History: [place] No Food Insecurity (10/02/2022)  Housing: Low Risk  (10/02/2022)  Transportation Needs: No Transportation Needs (10/02/2022)  Utilities: Not At Risk (10/02/2022)  Tobacco Use: Low Risk  (10/01/2022)   SDOH Interventions:     Readmission Risk Interventions     No data to display

## 2022-10-03 NOTE — Progress Notes (Signed)
PT O2 dasated down to low  to mid 80s while sleeping placed on 2L of O2.

## 2022-10-03 NOTE — Plan of Care (Signed)

## 2022-10-03 NOTE — Progress Notes (Signed)
Mobility Specialist - Progress Note   10/03/22 1152  Mobility  Activity Ambulated independently in hallway  Level of Assistance Independent  Assistive Device None  Distance Ambulated (ft) 500 ft  Activity Response Tolerated well  Mobility Referral Yes  $Mobility charge 1 Mobility   Pt received in bed and agreeable to mobility. No complaints during session. Pt to bed after session with all needs met & husband in room.  [facility]

## 2022-10-03 NOTE — Progress Notes (Signed)
Pt dropped to 86% lying in bed and was complaining of being SOB. 2L O2 applied.

## 2022-10-04 DIAGNOSIS — E860 Dehydration: Secondary | ICD-10-CM | POA: Diagnosis not present

## 2022-10-04 DIAGNOSIS — E871 Hypo-osmolality and hyponatremia: Secondary | ICD-10-CM | POA: Diagnosis not present

## 2022-10-04 LAB — GLUCOSE, CAPILLARY
Glucose-Capillary: 167 mg/dL — ABNORMAL HIGH (ref 70–99)
Glucose-Capillary: 171 mg/dL — ABNORMAL HIGH (ref 70–99)

## 2022-10-04 MED ORDER — METFORMIN HCL 500 MG PO TABS: 500.0000 mg | ORAL_TABLET | Freq: Every day | ORAL | 0 refills | Status: DC

## 2022-10-04 MED ORDER — CLONAZEPAM 1 MG PO TABS: 1.0000 mg | ORAL_TABLET | Freq: Two times a day (BID) | ORAL | Status: DC | PRN

## 2022-10-04 MED ORDER — DULOXETINE HCL 30 MG PO CPEP
30.0000 mg | ORAL_CAPSULE | Freq: Every day | ORAL | Status: DC
  Administered 2022-10-04: 30 mg via ORAL
  Filled 2022-10-04: qty 1

## 2022-10-04 MED ORDER — GLIPIZIDE 10 MG PO TABS
5.0000 mg | ORAL_TABLET | Freq: Two times a day (BID) | ORAL | Status: DC
  Administered 2022-10-04: 5 mg via ORAL
  Filled 2022-10-04: qty 1

## 2022-10-04 MED ORDER — BUTALBITAL-APAP-CAFFEINE 50-325-40 MG PO TABS: 1.0000 | ORAL_TABLET | Freq: Once | ORAL | Status: DC

## 2022-10-04 MED ORDER — METFORMIN HCL 500 MG PO TABS
500.0000 mg | ORAL_TABLET | Freq: Every day | ORAL | Status: DC
  Filled 2022-10-04: qty 1

## 2022-10-04 NOTE — Plan of Care (Signed)
Problem: Education: Goal: Knowledge of General Education information will improve Description: Including pain rating scale, medication(s)/side effects and non-pharmacologic comfort measures 10/04/2022 1144 by Linus Salmons, RN Outcome: Adequate for Discharge 10/04/2022 0757 by Linus Salmons, RN Outcome: Progressing   Problem: Health Behavior/Discharge Planning: Goal: Ability to manage health-related needs will improve 10/04/2022 1144 by Linus Salmons, RN Outcome: Adequate for Discharge 10/04/2022 0757 by Linus Salmons, RN Outcome: Progressing   Problem: Clinical Measurements: Goal: Ability to maintain clinical measurements within normal limits will improve 10/04/2022 1144 by Linus Salmons, RN Outcome: Adequate for Discharge 10/04/2022 0757 by Linus Salmons, RN Outcome: Progressing Goal: Will remain free from infection 10/04/2022 1144 by Linus Salmons, RN Outcome: Adequate for Discharge 10/04/2022 0757 by Linus Salmons, RN Outcome: Progressing Goal: Diagnostic test results will improve 10/04/2022 1144 by Linus Salmons, RN Outcome: Adequate for Discharge 10/04/2022 0757 by Linus Salmons, RN Outcome: Progressing Goal: Respiratory complications will improve 10/04/2022 1144 by Linus Salmons, RN Outcome: Adequate for Discharge 10/04/2022 0757 by Linus Salmons, RN Outcome: Progressing Goal: Cardiovascular complication will be avoided 10/04/2022 1144 by Linus Salmons, RN Outcome: Adequate for Discharge 10/04/2022 0757 by Linus Salmons, RN Outcome: Progressing   Problem: Activity: Goal: Risk for activity intolerance will decrease 10/04/2022 1144 by Linus Salmons, RN Outcome: Adequate for Discharge 10/04/2022 0757 by Linus Salmons, RN Outcome: Progressing   Problem: Nutrition: Goal: Adequate nutrition will be maintained 10/04/2022 1144 by Linus Salmons, RN Outcome: Adequate for Discharge 10/04/2022 0757 by Linus Salmons, RN Outcome: Progressing   Problem:  Coping: Goal: Level of anxiety will decrease 10/04/2022 1144 by Linus Salmons, RN Outcome: Adequate for Discharge 10/04/2022 0757 by Linus Salmons, RN Outcome: Progressing   Problem: Elimination: Goal: Will not experience complications related to bowel motility 10/04/2022 1144 by Linus Salmons, RN Outcome: Adequate for Discharge 10/04/2022 0757 by Linus Salmons, RN Outcome: Progressing Goal: Will not experience complications related to urinary retention 10/04/2022 1144 by Linus Salmons, RN Outcome: Adequate for Discharge 10/04/2022 0757 by Linus Salmons, RN Outcome: Progressing   Problem: Pain Managment: Goal: General experience of comfort will improve 10/04/2022 1144 by Linus Salmons, RN Outcome: Adequate for Discharge 10/04/2022 0757 by Linus Salmons, RN Outcome: Progressing   Problem: Safety: Goal: Ability to remain free from injury will improve 10/04/2022 1144 by Linus Salmons, RN Outcome: Adequate for Discharge 10/04/2022 0757 by Linus Salmons, RN Outcome: Progressing   Problem: Skin Integrity: Goal: Risk for impaired skin integrity will decrease 10/04/2022 1144 by Linus Salmons, RN Outcome: Adequate for Discharge 10/04/2022 0757 by Linus Salmons, RN Outcome: Progressing   Problem: Education: Goal: Ability to describe self-care measures that may prevent or decrease complications (Diabetes Survival Skills Education) will improve 10/04/2022 1144 by Linus Salmons, RN Outcome: Adequate for Discharge 10/04/2022 0757 by Linus Salmons, RN Outcome: Progressing Goal: Individualized Educational Video(s) 10/04/2022 1144 by Linus Salmons, RN Outcome: Adequate for Discharge 10/04/2022 0757 by Linus Salmons, RN Outcome: Progressing   Problem: Coping: Goal: Ability to adjust to condition or change in health will improve 10/04/2022 1144 by Linus Salmons, RN Outcome: Adequate for Discharge 10/04/2022 0757 by Linus Salmons, RN Outcome: Progressing   Problem: Fluid  Volume: Goal: Ability to maintain a balanced intake and output will improve 10/04/2022 1144 by Linus Salmons, RN Outcome: Adequate for Discharge 10/04/2022 0757 by Althia Forts,  Harlon Flor, RN Outcome: Progressing   Problem: Health Behavior/Discharge Planning: Goal: Ability to identify and utilize available resources and services will improve 10/04/2022 1144 by Linus Salmons, RN Outcome: Adequate for Discharge 10/04/2022 0757 by Linus Salmons, RN Outcome: Progressing Goal: Ability to manage health-related needs will improve 10/04/2022 1144 by Linus Salmons, RN Outcome: Adequate for Discharge 10/04/2022 0757 by Linus Salmons, RN Outcome: Progressing   Problem: Metabolic: Goal: Ability to maintain appropriate glucose levels will improve 10/04/2022 1144 by Linus Salmons, RN Outcome: Adequate for Discharge 10/04/2022 0757 by Linus Salmons, RN Outcome: Progressing   Problem: Nutritional: Goal: Maintenance of adequate nutrition will improve 10/04/2022 1144 by Linus Salmons, RN Outcome: Adequate for Discharge 10/04/2022 0757 by Linus Salmons, RN Outcome: Progressing Goal: Progress toward achieving an optimal weight will improve 10/04/2022 1144 by Linus Salmons, RN Outcome: Adequate for Discharge 10/04/2022 0757 by Linus Salmons, RN Outcome: Progressing   Problem: Skin Integrity: Goal: Risk for impaired skin integrity will decrease 10/04/2022 1144 by Linus Salmons, RN Outcome: Adequate for Discharge 10/04/2022 0757 by Linus Salmons, RN Outcome: Progressing   Problem: Tissue Perfusion: Goal: Adequacy of tissue perfusion will improve 10/04/2022 1144 by Linus Salmons, RN Outcome: Adequate for Discharge 10/04/2022 0757 by Linus Salmons, RN Outcome: Progressing

## 2022-10-04 NOTE — Discharge Summary (Signed)
Physician Discharge Summary  [name] HYW:[id number] DOB: 01/06/1941 DOA: 10/01/2022  PCP: Collene Leyden, MD  Admit date: 10/01/2022 Discharge date: 10/04/2022 Recommendations for Outpatient Follow-up:  Follow up with PCP in 1 weeks-call for appointment Please obtain BMP/CBC in one week  Discharge Dispo: Home Discharge Condition: Stable Code Status:   Code Status: Full Code Diet recommendation:  Diet Order             Diet heart healthy/carb modified Room service appropriate? Yes; Fluid consistency: Thin  Diet effective now                    Brief/Interim Summary: 82yof w/ hypertension, hyperlipidemia, NIDDMT2-on glipizide/Jardiance/metformin, diastolic congestive heart failure (Echo 05/2021 EF 55-60% with G1DD), asthma, prior stroke, gerd, breast cancer, gout presented to [facility]  w/ progressively worsening nausea, vomiting weakness and bodyaches. Patient was diagnosed with flu and pneumonia 1/19, discharged on antibiotics but returned on 1/20 with inability to keep down antibiotic and continued symptoms, intolerance to doxycycline Patient was found to have dehydration with hyponatremia lactic acidosis that resolved with IV fluid hydration.  Patient having elevated blood pressure along with elevated blood sugar, respiratory symptoms with vague pulmonary infiltrates on CT chest, procalcitonin reassuring, also having headache.  Respiratory virus panel positive for RSV 1/21.  Diabetes: Has been consulted with uncontrolled hyperglycemia in the setting of acute illness and steroid use. At this time patient is tolerating diet no nausea or vomiting, having bowel movement.  Headache has resolved.  With the reason for her uncontrolled hyperglycemia, RSV infection seems to have improved with mild cough only, not hypoxic on at rest but needing oxygen on ambulation, will qualify for home oxygen. She would like to go home today, discussed plan of care with patient's family on the phone and at  the bedside Patient was monitored, blood sugar overall stable continue home regimen  Patient feels improved this morning and feels ready for discharge home  Discharge Diagnoses:  Principal Problem:   Dehydration with hyponatremia Active Problems:   Antibiotic reaction, initial encounter   Occipital headache   Essential hypertension   Abnormal CT of the chest   Lactic acidosis   Chronic diastolic CHF (congestive heart failure) (HCC)   Fracture of proximal phalanx of lesser toe of right foot   Type 2 diabetes mellitus with hyperglycemia (HCC)   Mixed hyperlipidemia due to type 2 diabetes mellitus (HCC)   GERD without esophagitis   RSV infection  Dehydration Hyponatremia Antibiotics reaction with vomiting intolerance to doxycycline Lactic acidosis due to volume depletion: Hyponatremia dehydration has resolved BP stable, afebrile.  BUN/creatinine and sodium stable.  Lactic acidosis resolved.  Has had bowel movement tolerating diet.  Type 2 diabetes mellitus with uncontrolled hyperglycemia : Glipizide Jardiance was resumed with blood sugar improving.  Has used steroid in past week/month but none currently.  Blood sugar this morning 167 although 10.4 A1c indicating poor control outpatient.patient can resume metformin, also continue glipizide and Jardiance she can double the dose of glipizide if blood sugar remains uncontrolled -she has been encouraged to follow-up with PCP within a couple of days after maintaining logbook of her blood sugar check .  She does have appointment tomorrow. Recent Labs  Lab 10/02/22 0729 10/02/22 0825 10/03/22 0752 10/03/22 1157 10/03/22 1641 10/03/22 2029 10/04/22 0848  GLUCAP  --    < > 244* 260* 209* 260* 167*  HGBA1C 10.4*  --   --   --   --   --   --    < > =  values in this interval not displayed.    Occipital headache: CT head obtained 1/21 with no acute finding.  Managed with Toradol as needed/Tylenol.  Headache has improved Hypertension on  amlodipine 5 mg HLD on Lipitor 80 mg GERD on PPI  Abnormal CT chest Recent influenza infection on the first of January RSV +1/21 Acute hypoxic respiratory failure now with nocturnal hypoxia doing well on room air at rest but needing oxygen on ambulation CT chest 1/19 patchy bilateral infiltrates, chest x-ray 1/20 no definite infiltrates, procalcitonin unremarkable ruling out bacterial infection COVID-19 negative but RSV positive holding off on antibiotics, patient is afebrile no leukocytosis, continue symptomatic management.  Echo completed 1/21.She will need home o2 as her spo2 was low in 80s in sleep, and was at 86% in ambulation.  Advised to use oxygen during sleep and during ambulation.  Follow-up with PCP for ongoing monitoring asked CM to set up home pulse oximeter  Chronic diastolic CHF: Initially needing fluid restriction for dehydration.  Echo showed EF 70 to 75%, G1 DD normal RV function size, RV SP 29, mitral valve aortic valve no acute finding. Volume status stable, her weight stable as below [employer]   10/02/22 0326 10/03/22 0610 10/04/22 0224  Weight: 68.9 kg 69.1 kg 69.3 kg    Fracture of proximal phalanx of lesser toe of right foot:significant right foot bruising and tenderness of the fourth and fifth digit of the right foot.X-ray obtained revealing nondisplaced fracture of the proximal fifth digit of the right foot. Cont buddy taping of the fourth and fifth toe as well as right foot protective boot. She has boot placed. sHe has not pain.  Follow-up with PCP/orthopedic outpatient   Consults: none Subjective: Alert and oriented resting comfortably, she feels much improved today and feels ready for discharge home  Discharge Exam: Vitals:   10/03/22 2130 10/04/22 0224  BP:  (!) 163/74  Pulse:  72  Resp:  14  Temp: 99.1 F (37.3 C) 98.4 F (36.9 C)  SpO2:  94%   General: Pt is alert, awake, not in acute distress Cardiovascular: RRR, S1/S2 +, no rubs, no  gallops Respiratory: CTA bilaterally, no wheezing, no rhonchi Abdominal: Soft, NT, ND, bowel sounds + Extremities: no edema, no cyanosis  Discharge Instructions  Discharge Instructions     Discharge instructions   Complete by: As directed    Please call call MD or return to ER for similar or worsening recurring problem that brought you to hospital or if any fever,nausea/vomiting,abdominal pain, uncontrolled pain, chest pain,  shortness of breath or any other alarming symptoms.  Please follow-up your doctor as instructed in a week time and call the office for appointment.  Please avoid alcohol, smoking, or any other illicit substance and maintain healthy habits including taking your regular medications as prescribed.  You were cared for by a hospitalist during your hospital stay. If you have any questions about your discharge medications or the care you received while you were in the hospital after you are discharged, you can call the unit and ask to speak with the hospitalist on call if the hospitalist that took care of you is not available.  Once you are discharged, your primary care physician will handle any further medical issues. Please note that NO REFILLS for any discharge medications will be authorized once you are discharged, as it is imperative that you return to your primary care physician (or establish a relationship with a primary care physician if you do not have  one) for your aftercare needs so that they can reassess your need for medications and monitor your lab values  Check blood sugar 3 times a day and bedtime at home. If blood sugar running above 200 less than 70 please call your MD to adjust insulin. If blood sugars running less 100 do not use insulin and call MD. If you noticed signs and symptoms of hypoglycemia or low blood sugar like jitteriness, confusion, thirst, tremor, sweating- Check blood sugar, drink sugary drink/biscuits/sweets to increase sugar level and call  MD or return to ER.   Increase activity slowly   Complete by: As directed       Allergies as of 10/04/2022       Reactions   Ciprofloxacin    Other reaction(s): Other (See Comments)   Codeine Nausea And Vomiting   Doxycycline Nausea And Vomiting   REACTION: GI upset   Percocet [oxycodone-acetaminophen] Nausea And Vomiting   Tramadol    Other reaction(s): Other (See Comments)        Medication List     STOP taking these medications    promethazine-dextromethorphan 6.25-15 MG/5ML syrup Commonly known as: PROMETHAZINE-DM       TAKE these medications    albuterol 108 (90 Base) MCG/ACT inhaler Commonly known as: VENTOLIN HFA Inhale 1-2 puffs into the lungs every 4 (four) hours as needed for wheezing or shortness of breath.   albuterol 108 (90 Base) MCG/ACT inhaler Commonly known as: VENTOLIN HFA Inhale 2 puffs into the lungs every 4 (four) hours as needed for wheezing or shortness of breath.   amLODipine 5 MG tablet Commonly known as: NORVASC Take 5 mg by mouth daily.   atorvastatin 80 MG tablet Commonly known as: LIPITOR Take 1 tablet (80 mg total) by mouth daily at 6 PM.   azelastine 0.1 % nasal spray Commonly known as: ASTELIN Place 1 spray into both nostrils 2 (two) times daily. Use in each nostril as directed What changed: Another medication with the same name was removed. Continue taking this medication, and follow the directions you see here.   benzonatate 200 MG capsule Commonly known as: TESSALON Take 1 capsule (200 mg total) by mouth 3 (three) times daily as needed for cough. What changed: Another medication with the same name was removed. Continue taking this medication, and follow the directions you see here.   clobetasol cream 0.05 % Commonly known as: TEMOVATE Apply 1 application topically daily at 6 (six) AM.   clonazePAM 1 MG tablet Commonly known as: KLONOPIN Take 1 mg by mouth 2 (two) times daily as needed for anxiety.   clopidogrel 75  MG tablet Commonly known as: PLAVIX Take 1 tablet (75 mg total) by mouth daily.   DULoxetine 30 MG capsule Commonly known as: CYMBALTA Take 30 mg by mouth daily.   Fluticasone-Salmeterol 250-50 MCG/DOSE Aepb Commonly known as: ADVAIR Inhale 1 puff into the lungs 2 (two) times daily.   glipiZIDE 5 MG tablet Commonly known as: GLUCOTROL Take 5 mg by mouth every morning.   guaiFENesin 600 MG 12 hr tablet Commonly known as: Mucinex Take 1 tablet (600 mg total) by mouth 2 (two) times daily as needed. What changed: reasons to take this   isosorbide mononitrate 30 MG 24 hr tablet Commonly known as: IMDUR TAKE 1 TABLET BY MOUTH DAILY   Jardiance 10 MG Tabs tablet Generic drug: empagliflozin Take 10 mg by mouth daily.   loratadine 10 MG tablet Commonly known as: CLARITIN Take 10 mg by mouth  daily as needed for allergies.   metFORMIN 500 MG tablet Commonly known as: GLUCOPHAGE Take 1 tablet (500 mg total) by mouth daily with breakfast. Start taking on: October 05, 2022   montelukast 10 MG tablet Commonly known as: SINGULAIR Take 1 tablet (10 mg total) by mouth at bedtime.   nitroGLYCERIN 0.4 MG SL tablet Commonly known as: NITROSTAT Place 1 tablet (0.4 mg total) under the tongue every 5 (five) minutes as needed for chest pain.   ondansetron 4 MG tablet Commonly known as: ZOFRAN Take 1 tablet (4 mg total) by mouth every 8 (eight) hours as needed for nausea or vomiting.   pantoprazole 40 MG tablet Commonly known as: PROTONIX Take 1 tablet (40 mg total) by mouth daily. What changed:  when to take this reasons to take this               Durable Medical Equipment  (From admission, onward)           Start     Ordered   10/03/22 1525  For home use only DME oxygen  Once       Comments: Cont 2l [place] on ambulation  Question Answer Comment  Length of Need Lifetime   Mode or (Route) Nasal cannula   Liters per Minute 2   Oxygen delivery system Gas      10/03/22  1524            Follow-up Information     Collene Leyden, MD Follow up in 1 day(s).   Specialty: Family Medicine Contact information: [address] [place] [address] [phone]                Allergies  Allergen Reactions   Ciprofloxacin     Other reaction(s): Other (See Comments)   Codeine Nausea And Vomiting   Doxycycline Nausea And Vomiting    REACTION: GI upset   Percocet [Oxycodone-Acetaminophen] Nausea And Vomiting   Tramadol     Other reaction(s): Other (See Comments)    The results of significant diagnostics from this hospitalization (including imaging, microbiology, ancillary and laboratory) are listed below for reference.    Microbiology: Recent Results (from the past 240 hour(s))  Resp panel by RT-PCR (RSV, Flu A&B, Covid) Nasopharyngeal Swab     Status: None   Collection Time: 09/30/22  1:33 PM   Specimen: Nasopharyngeal Swab; Nasal Swab  Result Value Ref Range Status   SARS Coronavirus 2 by RT PCR NEGATIVE NEGATIVE Final    Comment: (NOTE) SARS-CoV-2 target nucleic acids are NOT DETECTED.  The SARS-CoV-2 RNA is generally detectable in upper respiratory specimens during the acute phase of infection. The lowest concentration of SARS-CoV-2 viral copies this assay can detect is 138 copies/mL. A negative result does not preclude SARS-Cov-2 infection and should not be used as the sole basis for treatment or other patient management decisions. A negative result may occur with  improper specimen collection/handling, submission of specimen other than nasopharyngeal swab, presence of viral mutation(s) within the areas targeted by this assay, and inadequate number of viral copies(<138 copies/mL). A negative result must be combined with clinical observations, patient history, and epidemiological information. The expected result is Negative.  Fact Sheet for Patients:  EntrepreneurPulse.com.au  Fact Sheet for Healthcare  Providers:  IncredibleEmployment.be  This test is no t yet approved or cleared by the [place] FDA and  has been authorized for detection and/or diagnosis of SARS-CoV-2 by FDA under an Emergency Use Authorization (EUA).  This EUA will remain  in effect (meaning this test can be used) for the duration of the COVID-19 declaration under Section 564(b)(1) of the Act, 21 U.S.C.section 360bbb-3(b)(1), unless the authorization is terminated  or revoked sooner.       Influenza A by PCR NEGATIVE NEGATIVE Final   Influenza B by PCR NEGATIVE NEGATIVE Final    Comment: (NOTE) The Xpert Xpress SARS-CoV-2/FLU/RSV plus assay is intended as an aid in the diagnosis of influenza from Nasopharyngeal swab specimens and should not be used as a sole basis for treatment. Nasal washings and aspirates are unacceptable for Xpert Xpress SARS-CoV-2/FLU/RSV testing.  Fact Sheet for Patients: EntrepreneurPulse.com.au  Fact Sheet for Healthcare Providers: IncredibleEmployment.be  This test is not yet approved or cleared by the [place] FDA and has been authorized for detection and/or diagnosis of SARS-CoV-2 by FDA under an Emergency Use Authorization (EUA). This EUA will remain in effect (meaning this test can be used) for the duration of the COVID-19 declaration under Section 564(b)(1) of the Act, 21 U.S.C. section 360bbb-3(b)(1), unless the authorization is terminated or revoked.     Resp Syncytial Virus by PCR NEGATIVE NEGATIVE Final    Comment: (NOTE) Fact Sheet for Patients: EntrepreneurPulse.com.au  Fact Sheet for Healthcare Providers: IncredibleEmployment.be  This test is not yet approved or cleared by the [place] FDA and has been authorized for detection and/or diagnosis of SARS-CoV-2 by FDA under an Emergency Use Authorization (EUA). This EUA will remain in effect (meaning this test can be  used) for the duration of the COVID-19 declaration under Section 564(b)(1) of the Act, 21 U.S.C. section 360bbb-3(b)(1), unless the authorization is terminated or revoked.  Performed at [facility], [address], [place], [place] [address]   Culture, blood (routine x 2)     Status: None (Preliminary result)   Collection Time: 09/30/22  8:00 PM   Specimen: BLOOD  Result Value Ref Range Status   Specimen Description BLOOD RIGHT ANTECUBITAL  Final   Special Requests   Final    BOTTLES DRAWN AEROBIC AND ANAEROBIC Blood Culture adequate volume   Culture   Final    NO GROWTH 4 DAYS Performed at [facility], [place] [address] [place], [place] [address]    Report Status PENDING  Incomplete  Culture, blood (routine x 2)     Status: None (Preliminary result)   Collection Time: 09/30/22  8:00 PM   Specimen: BLOOD  Result Value Ref Range Status   Specimen Description BLOOD LEFT ANTECUBITAL  Final   Special Requests   Final    BOTTLES DRAWN AEROBIC AND ANAEROBIC Blood Culture adequate volume   Culture   Final    NO GROWTH 4 DAYS Performed at [facility], [place] [address], [place], [place] [address]    Report Status PENDING  Incomplete  Respiratory (~20 pathogens) panel by PCR     Status: Abnormal   Collection Time: 10/02/22 11:16 AM   Specimen: Nasopharyngeal Swab; Respiratory  Result Value Ref Range Status   Adenovirus NOT DETECTED NOT DETECTED Final   Coronavirus 229E NOT DETECTED NOT DETECTED Final    Comment: (NOTE) The Coronavirus on the Respiratory Panel, DOES NOT test for the novel  Coronavirus (2019 nCoV)    Coronavirus HKU1 NOT DETECTED NOT DETECTED Final   Coronavirus NL63 NOT DETECTED NOT DETECTED Final   Coronavirus OC43 NOT DETECTED NOT DETECTED Final   Metapneumovirus NOT DETECTED NOT DETECTED Final   Rhinovirus / Enterovirus NOT  DETECTED NOT DETECTED Final   Influenza A NOT DETECTED NOT DETECTED Final   Influenza B NOT DETECTED NOT  DETECTED Final   Parainfluenza Virus 1 NOT DETECTED NOT DETECTED Final   Parainfluenza Virus 2 NOT DETECTED NOT DETECTED Final   Parainfluenza Virus 3 NOT DETECTED NOT DETECTED Final   Parainfluenza Virus 4 NOT DETECTED NOT DETECTED Final   Respiratory Syncytial Virus DETECTED (A) NOT DETECTED Final   Bordetella pertussis NOT DETECTED NOT DETECTED Final   Bordetella Parapertussis NOT DETECTED NOT DETECTED Final   Chlamydophila pneumoniae NOT DETECTED NOT DETECTED Final   Mycoplasma pneumoniae NOT DETECTED NOT DETECTED Final    Comment: Performed at [facility], [place] [address], [place], [place] [address]    Procedures/Studies: ECHOCARDIOGRAM COMPLETE  Result Date: 10/02/2022    ECHOCARDIOGRAM REPORT   Patient Name:   CLYDELL ALBERTS Date of Exam: 10/02/2022 Medical Rec #:  [id number]     Height:       65.0 in Accession #:    [id number]    Weight:       152.0 lb Date of Birth:  [birth year]      BSA:          1.760 m Patient Age:    45 years      BP:           111/67 mmHg Patient Gender: F             HR:           102 bpm. Exam Location:  Inpatient Procedure: 2D Echo and Intracardiac Opacification Agent Indications:    CHF  History:        Patient has prior history of Echocardiogram examinations, most                 recent 06/08/2021. Risk Factors:Diabetes, Hypertension and                 Dyslipidemia.  Sonographer:    Harvie Junior Referring Phys: [id number] Vernelle Emerald  Sonographer Comments: Technically difficult study due to poor echo windows. Image acquisition challenging due to respiratory motion. IMPRESSIONS  1. Left ventricular ejection fraction, by estimation, is 70 to 75%. The left ventricle has hyperdynamic function. The left ventricle has no regional wall motion abnormalities. There is mild left ventricular hypertrophy. Left ventricular diastolic parameters are consistent with Grade I diastolic dysfunction (impaired relaxation).  2. Right ventricular systolic function is normal. The right  ventricular size is normal. There is normal pulmonary artery systolic pressure. The estimated right ventricular systolic pressure is 92.4 mmHg.  3. The mitral valve is normal in structure. No evidence of mitral valve regurgitation. No evidence of mitral stenosis.  4. The aortic valve is tricuspid. There is mild calcification of the aortic valve. Aortic valve regurgitation is mild. Aortic valve sclerosis/calcification is present, without any evidence of aortic stenosis. Aortic regurgitation PHT measures 395 msec. Aortic valve mean gradient measures 5.0 mmHg. Aortic valve Vmax measures 1.52 m/s.  5. The inferior vena cava is normal in size with greater than 50% respiratory variability, suggesting right atrial pressure of 3 mmHg. Comparison(s): Prior images reviewed side by side. FINDINGS  Left Ventricle: Left ventricular ejection fraction, by estimation, is 70 to 75%. The left ventricle has hyperdynamic function. The left ventricle has no regional wall motion abnormalities. Definity contrast agent was given IV to delineate the left ventricular endocardial borders. The left ventricular internal cavity size was normal in size. There is  mild left ventricular hypertrophy. Left ventricular diastolic parameters are consistent with Grade I diastolic dysfunction (impaired relaxation). Right Ventricle: The right ventricular size is normal. No increase in right ventricular wall thickness. Right ventricular systolic function is normal. There is normal pulmonary artery systolic pressure. The tricuspid regurgitant velocity is 2.59 m/s, and  with an assumed right atrial pressure of 3 mmHg, the estimated right ventricular systolic pressure is 30.1 mmHg. Left Atrium: Left atrial size was normal in size. Right Atrium: Right atrial size was normal in size. Pericardium: There is no evidence of pericardial effusion. Mitral Valve: The mitral valve is normal in structure. No evidence of mitral valve regurgitation. No evidence of mitral  valve stenosis. Tricuspid Valve: The tricuspid valve is normal in structure. Tricuspid valve regurgitation is trivial. No evidence of tricuspid stenosis. Aortic Valve: The aortic valve is tricuspid. There is mild calcification of the aortic valve. Aortic valve regurgitation is mild. Aortic regurgitation PHT measures 395 msec. Aortic valve sclerosis/calcification is present, without any evidence of aortic stenosis. Aortic valve mean gradient measures 5.0 mmHg. Aortic valve peak gradient measures 9.2 mmHg. Aortic valve area, by VTI measures 2.14 cm. Pulmonic Valve: The pulmonic valve was normal in structure. Pulmonic valve regurgitation is trivial. No evidence of pulmonic stenosis. Aorta: The aortic root is normal in size and structure. Venous: The inferior vena cava is normal in size with greater than 50% respiratory variability, suggesting right atrial pressure of 3 mmHg. IAS/Shunts: No atrial level shunt detected by color flow Doppler.  LEFT VENTRICLE PLAX 2D LVIDd:         3.10 cm     Diastology LVIDs:         2.20 cm     LV e' medial:    5.44 cm/s LV PW:         1.10 cm     LV E/e' medial:  9.4 LV IVS:        1.10 cm     LV e' lateral:   7.94 cm/s LVOT diam:     2.00 cm     LV E/e' lateral: 6.4 LV SV:         47 LV SV Index:   27 LVOT Area:     3.14 cm  LV Volumes (MOD) LV vol d, MOD A4C: 40.4 ml LV vol s, MOD A4C: 16.9 ml LV SV MOD A4C:     40.4 ml RIGHT VENTRICLE RV Basal diam:  2.60 cm RV Mid diam:    2.20 cm RV S prime:     16.20 cm/s TAPSE (M-mode): 1.8 cm LEFT ATRIUM             Index        RIGHT ATRIUM           Index LA diam:        2.10 cm 1.19 cm/m   RA Area:     10.10 cm LA Vol (A2C):   27.6 ml 15.68 ml/m  RA Volume:   18.00 ml  10.23 ml/m LA Vol (A4C):   23.4 ml 13.29 ml/m LA Biplane Vol: 25.6 ml 14.54 ml/m  AORTIC VALVE                     PULMONIC VALVE AV Area (Vmax):    2.06 cm      PV Vmax:       1.19 m/s AV Area (Vmean):   1.97 cm      PV Peak grad:  5.7 mmHg AV Area (VTI):     2.14  cm AV Vmax:           151.50 cm/s AV Vmean:          107.000 cm/s AV VTI:            0.220 m AV Peak Grad:      9.2 mmHg AV Mean Grad:      5.0 mmHg LVOT Vmax:         99.20 cm/s LVOT Vmean:        67.000 cm/s LVOT VTI:          0.150 m LVOT/AV VTI ratio: 0.68 AI PHT:            395 msec  AORTA Ao Root diam: 2.80 cm Ao Asc diam:  3.00 cm MITRAL VALVE                TRICUSPID VALVE MV Area (PHT): 5.38 cm     TR Peak grad:   26.8 mmHg MV Decel Time: 141 msec     TR Vmax:        259.00 cm/s MV E velocity: 51.00 cm/s MV A velocity: 101.00 cm/s  SHUNTS MV E/A ratio:  0.50         Systemic VTI:  0.15 m                             Systemic Diam: 2.00 cm Candee Furbish MD Electronically signed by Candee Furbish MD Signature Date/Time: 10/02/2022/4:37:07 PM    Final    CT HEAD WO CONTRAST (5MM)  Result Date: 10/02/2022 CLINICAL DATA:  Severe sudden headache. EXAM: CT HEAD WITHOUT CONTRAST TECHNIQUE: Contiguous axial images were obtained from the base of the skull through the vertex without intravenous contrast. RADIATION DOSE REDUCTION: This exam was performed according to the departmental dose-optimization program which includes automated exposure control, adjustment of the mA and/or kV according to patient size and/or use of iterative reconstruction technique. COMPARISON:  03/30/2016 FINDINGS: Brain: There is no evidence for acute hemorrhage, hydrocephalus, mass lesion, or abnormal extra-axial fluid collection. No definite CT evidence for acute infarction. Diffuse loss of parenchymal volume is consistent with atrophy. Patchy low attenuation in the deep hemispheric and periventricular white matter is nonspecific, but likely reflects chronic microvascular ischemic demyelination. Vascular: No hyperdense vessel or unexpected calcification. Skull: No evidence for fracture. No worrisome lytic or sclerotic lesion. Sinuses/Orbits: The visualized paranasal sinuses and mastoid air cells are clear. Visualized portions of the globes  and intraorbital fat are unremarkable. Other: None. IMPRESSION: 1. No acute intracranial abnormality. 2. Atrophy with chronic small vessel ischemic disease. Electronically Signed   By: Misty Stanley M.D.   On: 10/02/2022 13:18   DG Foot Complete Right  Result Date: 10/02/2022 CLINICAL DATA:  Right foot pain. EXAM: RIGHT FOOT COMPLETE - 3+ VIEW COMPARISON:  Right foot radiographs 03/19/2020 FINDINGS: There is again diffuse decreased bone mineralization. Mild-to-moderate hallux valgus. Redemonstration of partial resection of the distal medial first metatarsal, bunionectomy. Mild great toe metatarsophalangeal joint space narrowing. New transverse lucency within the proximal metaphysis of the proximal phalanx of the fifth toe, an acute to subacute nondisplaced fracture. Mild irregularity of the second and third metatarsal bases unchanged from prior and may represent old healed fractures versus at least peripheral degenerative osteophytes. Moderate second and third tarsometatarsal joint space narrowing. Mild-to-moderate plantar and mild posterior calcaneal heel spurs. IMPRESSION: 1. Acute to  subacute nondisplaced fracture of the proximal metaphysis of the proximal phalanx of the fifth toe. 2. Mild-to-moderate hallux valgus. 3. Moderate second and third tarsometatarsal osteoarthritis. Electronically Signed   By: Yvonne Kendall M.D.   On: 10/02/2022 12:35   DG Chest Port 1 View  Result Date: 10/01/2022 CLINICAL DATA:  Weakness. EXAM: PORTABLE CHEST 1 VIEW COMPARISON:  Chest radiograph 09/30/2018 FINDINGS: Stable cardiac and mediastinal contours. Aortic atherosclerosis. No large area pulmonary consolidation. No pleural effusion or pneumothorax. Postsurgical changes overlying the right hemithorax. IMPRESSION: No active disease. Electronically Signed   By: Lovey Newcomer M.D.   On: 10/01/2022 15:34   CT Angio Chest PE W/Cm &/Or Wo Cm  Result Date: 09/30/2022 CLINICAL DATA:  Pulmonary embolus suspected with high  probability. EXAM: CT ANGIOGRAPHY CHEST WITH CONTRAST TECHNIQUE: Multidetector CT imaging of the chest was performed using the standard protocol during bolus administration of intravenous contrast. Multiplanar CT image reconstructions and MIPs were obtained to evaluate the vascular anatomy. RADIATION DOSE REDUCTION: This exam was performed according to the departmental dose-optimization program which includes automated exposure control, adjustment of the mA and/or kV according to patient size and/or use of iterative reconstruction technique. CONTRAST:  76m OMNIPAQUE IOHEXOL 350 MG/ML SOLN COMPARISON:  05/27/2021 FINDINGS: Cardiovascular: Motion artifact is present. There is good opacification of the central and segmental pulmonary arteries. No focal filling defects. No evidence of significant pulmonary embolus. Normal heart size. No pericardial effusions. Normal caliber thoracic aorta. Calcification of the aorta and coronary arteries. Mediastinum/Nodes: Esophagus is decompressed. No significant lymphadenopathy. Thyroid gland is unremarkable. Lungs/Pleura: Patchy airspace infiltrates demonstrated throughout both lungs most likely representing edema. Multifocal pneumonia would be a secondary consideration. No pleural effusions. No pneumothorax. Upper Abdomen: Surgical absence of the gallbladder. No acute abnormalities. Musculoskeletal: Right breast prosthesis with surgical clips in the right axilla. Degenerative changes in the spine. Old left rib fractures. Review of the MIP images confirms the above findings. IMPRESSION: 1. No evidence of significant pulmonary embolus. 2. Patchy airspace disease diffusely throughout both lungs likely representing edema. Multifocal pneumonia could also have this appearance in the appropriate clinical setting. Electronically Signed   By: WLucienne CapersM.D.   On: 09/30/2022 19:17   DG Chest 2 View  Result Date: 09/30/2022 CLINICAL DATA:  Shortness of breath and weakness EXAM:  CHEST - 2 VIEW COMPARISON:  Chest x-ray 09/15/2022 FINDINGS: The heart size and mediastinal contours are within normal limits. Both lungs are clear. The visualized skeletal structures are unremarkable. Right axillary surgical clips are again seen. IMPRESSION: No active cardiopulmonary disease. Electronically Signed   By: ARonney AstersM.D.   On: 09/30/2022 15:10   DG Chest 2 View  Result Date: 09/15/2022 CLINICAL DATA:  Productive cough for 2 weeks, chest tightness EXAM: CHEST - 2 VIEW COMPARISON:  10/18/2021 FINDINGS: Frontal and lateral views of the chest demonstrate an unremarkable cardiac silhouette. Chronic background scarring, without acute airspace disease, effusion, or pneumothorax. Postsurgical changes from right mastectomy and axillary node dissection. No acute bony abnormalities. IMPRESSION: 1. No acute intrathoracic process. Electronically Signed   By: MRanda NgoM.D.   On: 09/15/2022 12:03    Labs: BNP (last 3 results) Recent Labs    09/30/22 1330 10/01/22 1455  BNP 61.4 1176.1   Basic Metabolic Panel: Recent Labs  Lab 09/30/22 1333 09/30/22 1626 09/30/22 2000 10/01/22 1455 10/01/22 1549 10/02/22 0729 10/03/22 0658  NA 134*   < > 137 131* 132* 136 136  K 4.5   < >  3.8 4.5 4.7 3.9 3.9  CL 98  --  98 96*  --  102 104  CO2 24  --  27 23  --  23 23  GLUCOSE 310*  --  174* 371*  --  192* 247*  BUN 27*  --  23 22  --  14 14  CREATININE 1.21*  --  0.93 0.99  --  0.75 0.87  CALCIUM 9.8  --  9.2 9.5  --  8.6* 8.5*  MG  --   --   --   --   --  1.9 2.1   < > = values in this interval not displayed.   Liver Function Tests: Recent Labs  Lab 09/30/22 1550 10/01/22 1455 10/02/22 0729 10/03/22 0658  AST 14* 13* 17 18  ALT '26 23 23 22  '$ ALKPHOS 78 70 53 53  BILITOT 0.8 1.5* 1.2 1.0  PROT 7.6 6.8 5.6* 5.7*  ALBUMIN 4.2 4.0 3.1* 2.8*   Recent Labs  Lab 09/30/22 1550 10/01/22 1455  LIPASE 23 23   No results for input(s): "AMMONIA" in the last 168 hours. CBC: Recent  Labs  Lab 09/30/22 1333 09/30/22 1626 10/01/22 1455 10/01/22 1549 10/02/22 0729 10/03/22 0658  WBC 11.8*  --  12.4*  --  8.8 6.7  NEUTROABS  --   --  9.5*  --  6.5 5.0  HGB 15.3* 14.3 14.9 13.9 12.9 12.0  HCT 43.2 42.0 41.6 41.0 37.7 34.8*  MCV 87.8  --  88.3  --  91.1 90.9  PLT 244  --  247  --  186 172   Cardiac Enzymes: No results for input(s): "CKTOTAL", "CKMB", "CKMBINDEX", "TROPONINI" in the last 168 hours. BNP: Invalid input(s): "POCBNP" CBG: Recent Labs  Lab 10/03/22 0752 10/03/22 1157 10/03/22 1641 10/03/22 2029 10/04/22 0848  GLUCAP 244* 260* 209* 260* 167*   D-Dimer No results for input(s): "DDIMER" in the last 72 hours. Hgb A1c Recent Labs    10/02/22 0729  HGBA1C 10.4*   Lipid Profile No results for input(s): "CHOL", "HDL", "LDLCALC", "TRIG", "CHOLHDL", "LDLDIRECT" in the last 72 hours. Thyroid function studies Recent Labs    10/03/22 0658  TSH 2.080   Anemia work up No results for input(s): "VITAMINB12", "FOLATE", "FERRITIN", "TIBC", "IRON", "RETICCTPCT" in the last 72 hours. Urinalysis    Component Value Date/Time   COLORURINE YELLOW 10/01/2022 1944   APPEARANCEUR CLEAR 10/01/2022 1944   LABSPEC 1.036 (H) 10/01/2022 1944   PHURINE 7.0 10/01/2022 1944   GLUCOSEU >1,000 (A) 10/01/2022 1944   HGBUR NEGATIVE 10/01/2022 1944   BILIRUBINUR NEGATIVE 10/01/2022 1944   KETONESUR 15 (A) 10/01/2022 1944   PROTEINUR NEGATIVE 10/01/2022 1944   UROBILINOGEN 0.2 06/30/2014 1930   NITRITE NEGATIVE 10/01/2022 1944   LEUKOCYTESUR NEGATIVE 10/01/2022 1944   Sepsis Labs Recent Labs  Lab 09/30/22 1333 10/01/22 1455 10/02/22 0729 10/03/22 0658  WBC 11.8* 12.4* 8.8 6.7   Microbiology Recent Results (from the past 240 hour(s))  Resp panel by RT-PCR (RSV, Flu A&B, Covid) Nasopharyngeal Swab     Status: None   Collection Time: 09/30/22  1:33 PM   Specimen: Nasopharyngeal Swab; Nasal Swab  Result Value Ref Range Status   SARS Coronavirus 2 by RT PCR  NEGATIVE NEGATIVE Final    Comment: (NOTE) SARS-CoV-2 target nucleic acids are NOT DETECTED.  The SARS-CoV-2 RNA is generally detectable in upper respiratory specimens during the acute phase of infection. The lowest concentration of SARS-CoV-2 viral copies this assay can detect  is 138 copies/mL. A negative result does not preclude SARS-Cov-2 infection and should not be used as the sole basis for treatment or other patient management decisions. A negative result may occur with  improper specimen collection/handling, submission of specimen other than nasopharyngeal swab, presence of viral mutation(s) within the areas targeted by this assay, and inadequate number of viral copies(<138 copies/mL). A negative result must be combined with clinical observations, patient history, and epidemiological information. The expected result is Negative.  Fact Sheet for Patients:  EntrepreneurPulse.com.au  Fact Sheet for Healthcare Providers:  IncredibleEmployment.be  This test is no t yet approved or cleared by the [place] FDA and  has been authorized for detection and/or diagnosis of SARS-CoV-2 by FDA under an Emergency Use Authorization (EUA). This EUA will remain  in effect (meaning this test can be used) for the duration of the COVID-19 declaration under Section 564(b)(1) of the Act, 21 U.S.C.section 360bbb-3(b)(1), unless the authorization is terminated  or revoked sooner.       Influenza A by PCR NEGATIVE NEGATIVE Final   Influenza B by PCR NEGATIVE NEGATIVE Final    Comment: (NOTE) The Xpert Xpress SARS-CoV-2/FLU/RSV plus assay is intended as an aid in the diagnosis of influenza from Nasopharyngeal swab specimens and should not be used as a sole basis for treatment. Nasal washings and aspirates are unacceptable for Xpert Xpress SARS-CoV-2/FLU/RSV testing.  Fact Sheet for Patients: EntrepreneurPulse.com.au  Fact Sheet for  Healthcare Providers: IncredibleEmployment.be  This test is not yet approved or cleared by the [place] FDA and has been authorized for detection and/or diagnosis of SARS-CoV-2 by FDA under an Emergency Use Authorization (EUA). This EUA will remain in effect (meaning this test can be used) for the duration of the COVID-19 declaration under Section 564(b)(1) of the Act, 21 U.S.C. section 360bbb-3(b)(1), unless the authorization is terminated or revoked.     Resp Syncytial Virus by PCR NEGATIVE NEGATIVE Final    Comment: (NOTE) Fact Sheet for Patients: EntrepreneurPulse.com.au  Fact Sheet for Healthcare Providers: IncredibleEmployment.be  This test is not yet approved or cleared by the [place] FDA and has been authorized for detection and/or diagnosis of SARS-CoV-2 by FDA under an Emergency Use Authorization (EUA). This EUA will remain in effect (meaning this test can be used) for the duration of the COVID-19 declaration under Section 564(b)(1) of the Act, 21 U.S.C. section 360bbb-3(b)(1), unless the authorization is terminated or revoked.  Performed at [facility], [address], [place], [place] [address]   Culture, blood (routine x 2)     Status: None (Preliminary result)   Collection Time: 09/30/22  8:00 PM   Specimen: BLOOD  Result Value Ref Range Status   Specimen Description BLOOD RIGHT ANTECUBITAL  Final   Special Requests   Final    BOTTLES DRAWN AEROBIC AND ANAEROBIC Blood Culture adequate volume   Culture   Final    NO GROWTH 4 DAYS Performed at [facility], [place] [address] [place], [place] [address]    Report Status PENDING  Incomplete  Culture, blood (routine x 2)     Status: None (Preliminary result)   Collection Time: 09/30/22  8:00 PM   Specimen: BLOOD  Result Value Ref Range Status   Specimen Description BLOOD LEFT ANTECUBITAL  Final   Special Requests   Final     BOTTLES DRAWN AEROBIC AND ANAEROBIC Blood Culture adequate volume   Culture   Final    NO GROWTH 4 DAYS Performed at  [facility], [place] [address], [place], [place] [address]    Report Status PENDING  Incomplete  Respiratory (~20 pathogens) panel by PCR     Status: Abnormal   Collection Time: 10/02/22 11:16 AM   Specimen: Nasopharyngeal Swab; Respiratory  Result Value Ref Range Status   Adenovirus NOT DETECTED NOT DETECTED Final   Coronavirus 229E NOT DETECTED NOT DETECTED Final    Comment: (NOTE) The Coronavirus on the Respiratory Panel, DOES NOT test for the novel  Coronavirus (2019 nCoV)    Coronavirus HKU1 NOT DETECTED NOT DETECTED Final   Coronavirus NL63 NOT DETECTED NOT DETECTED Final   Coronavirus OC43 NOT DETECTED NOT DETECTED Final   Metapneumovirus NOT DETECTED NOT DETECTED Final   Rhinovirus / Enterovirus NOT DETECTED NOT DETECTED Final   Influenza A NOT DETECTED NOT DETECTED Final   Influenza B NOT DETECTED NOT DETECTED Final   Parainfluenza Virus 1 NOT DETECTED NOT DETECTED Final   Parainfluenza Virus 2 NOT DETECTED NOT DETECTED Final   Parainfluenza Virus 3 NOT DETECTED NOT DETECTED Final   Parainfluenza Virus 4 NOT DETECTED NOT DETECTED Final   Respiratory Syncytial Virus DETECTED (A) NOT DETECTED Final   Bordetella pertussis NOT DETECTED NOT DETECTED Final   Bordetella Parapertussis NOT DETECTED NOT DETECTED Final   Chlamydophila pneumoniae NOT DETECTED NOT DETECTED Final   Mycoplasma pneumoniae NOT DETECTED NOT DETECTED Final    Comment: Performed at [facility], [address], [place], [place] [address]  Time coordinating discharge: 25 minutes  SIGNED: Antonieta Pert, MD  [facility] 10/04/2022, 11:08 AM  If 7PM-7AM, please contact night-coverage [email or web address]

## 2022-10-04 NOTE — Progress Notes (Signed)
Mobility Specialist - Progress Note   10/04/22 1033  Mobility  Activity Ambulated independently in hallway  Level of Assistance Independent  Assistive Device None  Distance Ambulated (ft) 480 ft  Activity Response Tolerated well  Mobility Referral Yes  $Mobility charge 1 Mobility   Pt received in bed and agreeable to mobility. No complaints during session. Pt to bed after session with all needs met.    [facility]

## 2022-10-04 NOTE — Progress Notes (Signed)
Pt discharge home with home oxygen. Pt is alert and oriented. AVS was given and explained to pt and pt husband. Telemetry and IV were discontinued. All belongings were packed and sent home with the pt. Pt taken via wheelchair to main entrance by nurse tech. Pt husband to transport pt home.

## 2022-10-04 NOTE — Plan of Care (Signed)

## 2022-10-05 DIAGNOSIS — E860 Dehydration: Secondary | ICD-10-CM | POA: Diagnosis not present

## 2022-10-05 DIAGNOSIS — E871 Hypo-osmolality and hyponatremia: Secondary | ICD-10-CM | POA: Diagnosis not present

## 2022-10-05 DIAGNOSIS — J45909 Unspecified asthma, uncomplicated: Secondary | ICD-10-CM | POA: Diagnosis not present

## 2022-10-05 DIAGNOSIS — R0902 Hypoxemia: Secondary | ICD-10-CM | POA: Diagnosis not present

## 2022-10-05 DIAGNOSIS — E1165 Type 2 diabetes mellitus with hyperglycemia: Secondary | ICD-10-CM | POA: Diagnosis not present

## 2022-10-05 LAB — CULTURE, BLOOD (ROUTINE X 2)
Culture: NO GROWTH
Culture: NO GROWTH
Special Requests: ADEQUATE
Special Requests: ADEQUATE

## 2022-10-10 ENCOUNTER — Telehealth: Payer: Self-pay | Admitting: Internal Medicine

## 2022-10-10 NOTE — Assessment & Plan Note (Signed)
Subacute bronchitis. Illness sounds viral, but she may have had more than one. She wass well before this. Plan- finish augmentin. Tessalon perle.

## 2022-10-10 NOTE — Assessment & Plan Note (Signed)
Not sure if there is an ongoing problem underlying current acute illness. We can reassess once back to baseline.

## 2022-10-10 NOTE — Telephone Encounter (Signed)
Patient has been scheduled with Dr. Annamaria Boots tomorrow, she can address concerns then. NFN

## 2022-10-10 NOTE — Progress Notes (Unsigned)
Cardiology Office Note:    Date:  10/11/2022   ID:  [name], DOB 1940/10/06, MRN [id number]  PCP:  Collene Leyden, MD  Cardiologist:  [name], MD    Referring MD: Collene Leyden, MD   Chief Complaint  Patient presents with   Hypertension        Hyperlipidemia   Problem list 1. Shortness of breath with exertion - Asthma  2. Hyperlipidemia 3. Hypertension 4. CVA - on Plavix       [name] is a 82 y.o. female with a hx of DOE Hx of TIA years ago, was started on Plavix at that time Has no energy.   Gets short of breath walking to the mailbox and back  ( 100 yards)  No CP or tightness.    No syncope.    Has had a chronic cough / throat clearing . Had blood work at primary MD Alroy Dust)   Has significant vertebral Luevenia Maxin arterial disease. Dr. Erlinda Hong has given her a minimum  BP goal of 120-150  Dr. Mickle Plumb records from [place] reviewed  Sept. 5, 2019:  Seen for DOE and generalized fatigue Went to [facility] pulmonary and was told she had asthma Takes her inhalers.  Went to her primary care for her fatigue - was told to decrease her carbs   Denies any chest pain  Has headaches and has some dizziness ( history of a TIA in the past )  Does not get any regular exercise   Aug. 25, 2020   Has had worsening DOE recently  Similar to last year  Having severe DOE  Has seen pulmonary md in [place] of chest tightness.  I hear no wheezing on exam today   Oct. 30, 2020   [name] is seen today for follow-up of her severe shortness of breath with exertion.  We performed a right and left heart catheterization on her on March 29, 2019. The mid LAD had a 30% stenosis.  Fed normal left ventricular end-diastolic pressure with an EDP of 6 mmHg.  The left ventricular systolic function was normal with an ejection fraction of 55 to 65%. Right heart pressures were unremarkable.  Mean PA pressure was 40 mmHg.  Pulmonary capillary wedge pressure was 5.   Cardiac output was 7.3 L/min.  Feeling some better.   Still does not have the breathing capacity .  Has been walking some.   Seems to feel better on the Imdur   February 12, 2020: [name] is seen today for follow-up of her shortness of breath.  We performed a heart catheterization in July, 2020.   Their son is still in the hospital following surgery on his spine.   Lots of complications / infection Stressed but is otherwise doing well from a cardiac standpoint   Sept. 22, 2022 Was just hospitalized for CHF and pneumonia Echocardiogram from May 26, 2021 reveals normal left ventricular systolic function.  She has grade 1 diastolic dysfunction. Mild AI. Was treaded with lasix and leviquin   Feeling a bit better.   Oct. 10, 2023 [name] is seen for follow up of her dyspnea Breathing is going ok   Jan. 30, 2024 [name] is seen for follow up of her dyspnea.  Was admitted to the hospital on Jan. 20 with dehydration following an influenza infection  Was treated for atypical pneumonia ,  was found to also have RSV on 1/21  She was told to follow up with cardiology  Echo shows supranormal LV systolic function, with grade I DD  BNP is normal at 104.8 Troponin levels are 12,12,27,20  C/w increased demand ischemia    She has been since before Christmas  Has had various infections    Past Medical History:  Diagnosis Date   Aortic atherosclerosis ([place])    Asthma    Breast CA ([place])    Cancer ([place] )    right breast mastectomy   CVA (cerebral vascular accident) ([place]) 04/27/2015   DDD (degenerative disc disease), cervical    Duodenal ulcer    External hemorrhoids    Generalized anxiety disorder    H/O chest pain 03/24/2006   normal nuclear  study   Headache    High cholesterol    Hypertension    Mild aortic insufficiency 03/28/2006   echo   Normal cardiac stress test 2005   Pancreatitis    Personal history of chemotherapy    Personal history of radiation therapy    PONV  (postoperative nausea and vomiting)    Sinus bradycardia on ECG    Stroke [facility])     Past Surgical History:  Procedure Laterality Date   ABDOMINAL HYSTERECTOMY     APPENDECTOMY     "30 years ago"   childbirth     x 2   CHOLECYSTECTOMY     "15 years ago"   ESOPHAGOGASTRODUODENOSCOPY N/A 03/04/2014   Procedure: ESOPHAGOGASTRODUODENOSCOPY (EGD);  Surgeon: [name], MD;  Location: [facility] ENDOSCOPY;  Service: Endoscopy;  Laterality: N/A;   MASTECTOMY  1993   right breast   RIGHT/LEFT HEART CATH AND CORONARY ANGIOGRAPHY N/A 05/30/2019   Procedure: RIGHT/LEFT HEART CATH AND CORONARY ANGIOGRAPHY;  Surgeon: [name], MD;  Location: [place] CV LAB;  Service: Cardiovascular;  Laterality: N/A;   TUBAL LIGATION      Current Medications: Current Meds  Medication Sig   acetaminophen (TYLENOL) 500 MG tablet Take 500 mg by mouth every 6 (six) hours as needed. As needed for pain   albuterol (VENTOLIN HFA) 108 (90 Base) MCG/ACT inhaler Inhale 2 puffs into the lungs every 4 (four) hours as needed for wheezing or shortness of breath.   amLODipine (NORVASC) 5 MG tablet Take 5 mg by mouth daily.   atorvastatin (LIPITOR) 80 MG tablet Take 1 tablet (80 mg total) by mouth daily at 6 PM.   benzonatate (TESSALON) 200 MG capsule Take 1 capsule (200 mg total) by mouth 3 (three) times daily as needed for cough.   clobetasol cream (TEMOVATE) 5.57 % Apply 1 application topically daily at 6 (six) AM.   clonazePAM (KLONOPIN) 1 MG tablet Take 1 mg by mouth 2 (two) times daily as needed for anxiety.   clopidogrel (PLAVIX) 75 MG tablet Take 1 tablet (75 mg total) by mouth daily.   DULoxetine (CYMBALTA) 30 MG capsule Take 30 mg by mouth daily.   furosemide (LASIX) 40 MG tablet Take 1 tablet (40 mg total) by mouth daily.   guaiFENesin (MUCINEX) 600 MG 12 hr tablet Take 1 tablet (600 mg total) by mouth 2 (two) times daily as needed. (Patient taking differently: Take 600 mg by mouth 2 (two) times daily as  needed for cough.)   ipratropium-albuterol (DUONEB) 0.5-2.5 (3) MG/3ML SOLN Take 3 mLs by nebulization every 6 (six) hours as needed.   isosorbide mononitrate (IMDUR) 30 MG 24 hr tablet TAKE 1 TABLET BY MOUTH DAILY   JARDIANCE 10 MG TABS tablet Take 10 mg by mouth daily.   loratadine (CLARITIN) 10 MG  tablet Take 10 mg by mouth daily as needed for allergies.   metFORMIN (GLUCOPHAGE) 500 MG tablet Take 1 tablet (500 mg total) by mouth daily with breakfast.   montelukast (SINGULAIR) 10 MG tablet Take 1 tablet (10 mg total) by mouth at bedtime.   nitroGLYCERIN (NITROSTAT) 0.4 MG SL tablet Place 1 tablet (0.4 mg total) under the tongue every 5 (five) minutes as needed for chest pain.   ondansetron (ZOFRAN) 4 MG tablet Take 1 tablet (4 mg total) by mouth every 8 (eight) hours as needed for nausea or vomiting.   pantoprazole (PROTONIX) 40 MG tablet Take 1 tablet (40 mg total) by mouth daily. (Patient taking differently: Take 40 mg by mouth daily as needed (indigestion).)   potassium chloride (KLOR-CON) 10 MEQ tablet Take 1 tablet (10 mEq total) by mouth 2 (two) times daily.     Allergies:   Ciprofloxacin, Codeine, Doxycycline, Percocet [oxycodone-acetaminophen], and Tramadol   Social History   Socioeconomic History   Marital status: Married    Spouse name: Tommy    Number of children: 2   Years of education: 12+   Highest education level: Not on file  Occupational History   Occupation: retired  Tobacco Use   Smoking status: Never   Smokeless tobacco: Never  Vaping Use   Vaping Use: Never used  Substance and Sexual Activity   Alcohol use: No    Alcohol/week: 0.0 standard drinks of alcohol   Drug use: No   Sexual activity: Not Currently  Other Topics Concern   Not on file  Social History Narrative   Right handed, caffeine 2 cups daily, married, 4 kids, Retired.  [employer] Grad.    Social Determinants of Health   Financial Resource Strain: Not on file  Food Insecurity: No Food Insecurity  (10/02/2022)   Hunger Vital Sign    Worried About Running Out of Food in the Last Year: Never true    Ran Out of Food in the Last Year: Never true  Transportation Needs: No Transportation Needs (10/02/2022)   PRAPARE - Hydrologist (Medical): No    Lack of Transportation (Non-Medical): No  Physical Activity: Not on file  Stress: Not on file  Social Connections: Not on file     Family History: The patient's family history includes Asthma in her mother; Colon cancer (age of onset: 62) in her sister; Heart attack in her father, mother, sister, sister, and sister; Heart disease in her father and mother; Hypertension in her mother, sister, and sister; Lung disease in her daughter; Thyroid disease in her daughter. ROS:   Please see the history of present illness.     All other systems reviewed and are negative.  EKGs/Labs/Other Studies Reviewed:    The following studies were reviewed today:   EKG:         Recent Labs: 10/01/2022: B Natriuretic Peptide 104.8 10/03/2022: ALT 22; BUN 14; Creatinine, Ser 0.87; Hemoglobin 12.0; Magnesium 2.1; Platelets 172; Potassium 3.9; Sodium 136; TSH 2.080  Recent Lipid Panel    Component Value Date/Time   CHOL 109 07/12/2019 0925   TRIG 148 07/12/2019 0925   HDL 46 07/12/2019 0925   CHOLHDL 2.4 07/12/2019 0925   CHOLHDL 6.1 04/28/2015 0645   VLDL 60 (H) 04/28/2015 0645   LDLCALC 38 07/12/2019 0925    Physical Exam: Blood pressure (!) 142/72, pulse (!) 112, height '5\' 5"'$  (1.651 m), weight 151 lb (68.5 kg), SpO2 95 %.  GEN:  pale, elderly female,  in no acute distress HEENT: Normal NECK: No JVD; No carotid bruits LYMPHATICS: No lymphadenopathy CARDIAC: RRR , no murmurs, rubs, gallops RESPIRATORY:  coarse rales in both bases  ABDOMEN: Soft, non-tender, non-distended MUSCULOSKELETAL:  No edema; No deformity  SKIN: Warm and dry NEUROLOGIC:  Alert and oriented x 3    ASSESSMENT:    1. RSV (acute  bronchiolitis due to respiratory syncytial virus)   2. Pneumonia due to infectious organism, unspecified laterality, unspecified part of lung       PLAN:    1.  RSV pneumonia: Jadon presents 1 week after she was discharged from the hospital with influenza and RSV pneumonia.  She is still very short of breath.  She is on home oxygen.  She has supranormal left ventricular systolic function with EF of 70 to 75%.   2. Acute on chonic diastolic CHF:  she appears to have some acute decompensation of her diastolic CHF in the setting of her RSV pneumonia .  She is mildly hypertensive  Will start Lasix 40 mg a day,  Kdur 10 meq BID for several weeks .  I'll see herin 3 weeks for follow up   I'll see her back in several weeks for follow up  2. Hypertension:     add lasix   3. Hyperlipidemia:     4.  DM:        In order of problems listed above: Medication Adjustments/Labs and Tests Ordered: Current medicines are reviewed at length with the patient today.  Concerns regarding medicines are outlined above.  No orders of the defined types were placed in this encounter.   Meds ordered this encounter  Medications   furosemide (LASIX) 40 MG tablet    Sig: Take 1 tablet (40 mg total) by mouth daily.    Dispense:  14 tablet    Refill:  0   potassium chloride (KLOR-CON) 10 MEQ tablet    Sig: Take 1 tablet (10 mEq total) by mouth 2 (two) times daily.    Dispense:  28 tablet    Refill:  0      Signed, [name], MD  10/11/2022 9:12 PM    [facility]

## 2022-10-10 NOTE — Telephone Encounter (Signed)
ATC X1 LVM for pt to call the office back

## 2022-10-11 ENCOUNTER — Ambulatory Visit: Payer: PPO | Attending: Cardiovascular Disease | Admitting: Cardiovascular Disease

## 2022-10-11 ENCOUNTER — Encounter: Payer: Self-pay | Admitting: Cardiovascular Disease

## 2022-10-11 ENCOUNTER — Encounter: Payer: Self-pay | Admitting: Internal Medicine

## 2022-10-11 ENCOUNTER — Ambulatory Visit (INDEPENDENT_AMBULATORY_CARE_PROVIDER_SITE_OTHER): Payer: PPO | Admitting: Internal Medicine

## 2022-10-11 VITALS — BP 116/62 | HR 83 | Ht 65.0 in | Wt 149.4 lb

## 2022-10-11 VITALS — BP 142/72 | HR 112 | Ht 65.0 in | Wt 151.0 lb

## 2022-10-11 DIAGNOSIS — J210 Acute bronchiolitis due to respiratory syncytial virus: Secondary | ICD-10-CM

## 2022-10-11 DIAGNOSIS — J9601 Acute respiratory failure with hypoxia: Secondary | ICD-10-CM | POA: Insufficient documentation

## 2022-10-11 DIAGNOSIS — J189 Pneumonia, unspecified organism: Secondary | ICD-10-CM | POA: Diagnosis not present

## 2022-10-11 DIAGNOSIS — J40 Bronchitis, not specified as acute or chronic: Secondary | ICD-10-CM

## 2022-10-11 MED ORDER — FUROSEMIDE 40 MG PO TABS: 40.0000 mg | ORAL_TABLET | Freq: Every day | ORAL | 0 refills | Status: DC

## 2022-10-11 MED ORDER — POTASSIUM CHLORIDE ER 10 MEQ PO TBCR: 10.0000 meq | EXTENDED_RELEASE_TABLET | Freq: Two times a day (BID) | ORAL | 0 refills | Status: DC

## 2022-10-11 MED ORDER — IPRATROPIUM-ALBUTEROL 0.5-2.5 (3) MG/3ML IN SOLN: 3.0000 mL | Freq: Four times a day (QID) | RESPIRATORY_TRACT | 12 refills | Status: DC | PRN

## 2022-10-11 NOTE — Patient Instructions (Signed)
Script sent for ipratropium - albuterol ([place])  nebulizer solution. 1 amp as often as every 65 hours, only if you need more help than your Trelegy.  Continue oxygen for now at 2 L day and night. You can take it off from time to time to rest your nose, while you are sitting.

## 2022-10-11 NOTE — Assessment & Plan Note (Signed)
Not ready to come off of oxygen now. Plan-reassess on return in 1 month.  Meanwhile continue oxygen 24/7 2 L

## 2022-10-11 NOTE — Progress Notes (Signed)
HPI F never smoker followed for Asthma/ Bronchitis, complicated by HTN, Migraine, hx CVA, Angina, Aortic Insufficiency, Asthma, Bronchitis, hx R Breast Cancer, DM2, dCHF, Gr1DD, GERD,  Walk test on room air 10/11/2022-desaturated within the first lap to 88% on room air. =======================================================================  09/23/22- 81 yoF never smoker, former patient of mine Pt states she currently has flu A she is here due to coughing. She was seen at [facility] yesterday they have called her in medication this morning. Medical problem list includes HTN, Migraine, hx CVA, Angina, Aortic Insufficiency, Asthma, Bronchitis, hx R Breast Cancer,  - Prometh DM, albuterol hfa, Singulair, Advair 250, augmentin Recent Zpak, prednisone,  Had three UC visits for cough, blowing nose, yellow, no fever, tired. Had some diarrhea.  Too late for Tamiflu. Now on augmentin and had several rounds of prednisone. CXR 09/15/22-  IMPRESSION: 1. No acute intrathoracic process.  10/11/22- 81 yoF never smoker followed for Asthma/ Bronchitis, complicated by HTN, Migraine, hx CVA, Angina, Aortic Insufficiency, Asthma, Bronchitis, hx R Breast Cancer, DM2, dCHF, Gr1DD, GERD,  - Prometh DM, albuterol hfa, Singulair, Trelegy 100,  Recent Zpak, prednisone,  Hosp 1/20-1/23- RSV+, > O2 2L/ [employer] She had influenza A and then RSV infection.  Husband is here today.  He has been checking her oxygen saturation repeatedly at home and reports that her oxygen saturation drops very fast if she comes off of O2, down into the low 80s.  She feels weak with a sustained vague diffuse chest tightness aggravated by taking a deep breath.  Little cough now but sputum is still yellow.  No fever or blood.  Copious nasal discharge also described as yellow.  No headache. Walk test on room air 10/11/2022-desaturated within the first lap to 88% on room air. CXR 10/01/22 1V- IMPRESSION: No active disease. CTaPE 09/30/22-  IMPRESSION: 1. No  evidence of significant pulmonary embolus. 2. Patchy airspace disease diffusely throughout both lungs likely representing edema. Multifocal pneumonia could also have this appearance in the appropriate clinical setting.    ROS-see HPI   + = positive Constitutional:    weight loss, night sweats, fevers, chills, fatigue, lassitude. HEENT:    headaches, difficulty swallowing, tooth/dental problems, sore throat,       sneezing, itching, ear ache, nasal congestion, post nasal drip, snoring CV:    chest pain, orthopnea, PND, swelling in lower extremities, anasarca,                                  dizziness, palpitations Resp:   shortness of breath with exertion or at rest.                productive cough,   non-productive cough, coughing up of blood.              change in color of mucus.  wheezing.   Skin:    rash or lesions. GI:  No-   heartburn, indigestion, abdominal pain, nausea, vomiting, diarrhea,                 change in bowel habits, loss of appetite GU: dysuria, change in color of urine, no urgency or frequency.   flank pain. MS:   joint pain, stiffness, decreased range of motion, back pain. Neuro-     nothing unusual Psych:  change in mood or affect.  depression or anxiety.   memory loss.  OBJ- Physical Exam General- Alert, Oriented, Affect-appropriate, Distress- none acute  Skin- rash-none, lesions- none, excoriation- none Lymphadenopathy- none Head- atraumatic            Eyes- Gross vision intact, PERRLA, conjunctivae and secretions clear            Ears- Hearing, canals-normal            Nose- Clear, no-Septal dev, mucus, polyps, erosion, perforation             Throat- Mallampati II , mucosa clear , drainage- none, tonsils- atrophic Neck- flexible , trachea midline, no stridor , thyroid nl, carotid no bruit Chest - symmetrical excursion , unlabored           Heart/CV- RRR , no murmur , no gallop  , no rub, nl s1 s2                           - JVD- none , edema- none, stasis  changes- none, varices- none           Lung- + few faint crackles, wheeze- none, cough- none , dullness-none, rub- none           Chest wall-  Abd-  Br/ Gen/ Rectal- Not done, not indicated Extrem- cyanosis- none, clubbing, none, atrophy- none, strength- nl Neuro- grossly intact to observation

## 2022-10-11 NOTE — Patient Instructions (Signed)
Medication Instructions:  Your physician recommends that you continue on your current medications as directed. Please refer to the Current Medication list given to you today.  *If you need a refill on your cardiac medications before your next appointment, please call your pharmacy*   Lab Work: NONE If you have labs (blood work) drawn today and your tests are completely normal, you will receive your results only by: MyChart Message (if you have MyChart) OR A paper copy in the mail If you have any lab test that is abnormal or we need to change your treatment, we will call you to review the results.   Testing/Procedures: NONE   Follow-Up: At [facility], you and your health needs are our priority.  As part of our continuing mission to provide you with exceptional heart care, we have created designated Provider Care Teams.  These Care Teams include your primary Cardiologist (physician) and Advanced Practice Providers (APPs -  Physician Assistants and Nurse Practitioners) who all work together to provide you with the care you need, when you need it.  We recommend signing up for the patient portal called "MyChart".  Sign up information is provided on this After Visit Summary.  MyChart is used to connect with patients for Virtual Visits (Telemedicine).  Patients are able to view lab/test results, encounter notes, upcoming appointments, etc.  Non-urgent messages can be sent to your provider as well.   To learn more about what you can do with MyChart, go to [email or web address].    Your next appointment:   4 month(s)  Provider:   Philip [name], MD   

## 2022-10-11 NOTE — Assessment & Plan Note (Signed)
Subacute bronchitis related to acute viral infections with influenza A and then RSV.  CT suggested a viral pneumonia. Plan-need for further antibiotic.  For now, supportive care, slowly progressive ambulation as tolerated and we will reassess at next visit.

## 2022-10-20 ENCOUNTER — Ambulatory Visit: Payer: PPO | Admitting: Internal Medicine

## 2022-10-20 ENCOUNTER — Ambulatory Visit
Admission: RE | Admit: 2022-10-20 | Discharge: 2022-10-20 | Disposition: A | Discharge status: Home / Self Care | Payer: HMO | Source: Ambulatory Visit | Attending: Family Medicine | Admitting: Family Medicine

## 2022-10-20 ENCOUNTER — Other Ambulatory Visit: Payer: Self-pay | Admitting: Family Medicine

## 2022-10-20 DIAGNOSIS — J849 Interstitial pulmonary disease, unspecified: Secondary | ICD-10-CM | POA: Diagnosis not present

## 2022-10-20 DIAGNOSIS — J438 Other emphysema: Secondary | ICD-10-CM

## 2022-10-20 DIAGNOSIS — I700 Atherosclerosis of aorta: Secondary | ICD-10-CM | POA: Diagnosis not present

## 2022-10-20 DIAGNOSIS — E1165 Type 2 diabetes mellitus with hyperglycemia: Secondary | ICD-10-CM | POA: Diagnosis not present

## 2022-10-20 DIAGNOSIS — E119 Type 2 diabetes mellitus without complications: Secondary | ICD-10-CM | POA: Diagnosis not present

## 2022-10-20 DIAGNOSIS — R918 Other nonspecific abnormal finding of lung field: Secondary | ICD-10-CM | POA: Diagnosis not present

## 2022-10-20 DIAGNOSIS — I5030 Unspecified diastolic (congestive) heart failure: Secondary | ICD-10-CM | POA: Diagnosis not present

## 2022-10-20 DIAGNOSIS — J439 Emphysema, unspecified: Secondary | ICD-10-CM | POA: Diagnosis not present

## 2022-10-25 ENCOUNTER — Telehealth: Payer: Self-pay | Admitting: Internal Medicine

## 2022-10-26 ENCOUNTER — Telehealth: Payer: Self-pay | Admitting: Cardiovascular Disease

## 2022-10-26 DIAGNOSIS — Z79899 Other long term (current) drug therapy: Secondary | ICD-10-CM

## 2022-10-26 NOTE — Telephone Encounter (Signed)
Ok with me 

## 2022-10-26 NOTE — Telephone Encounter (Signed)
Attempted to call pt but unable to reach. Left pt a detailed message letting her know that we have to check with both providers prior to able to scheduling her an appt with new provider.   Dr. Annamaria Boots, are you okay with pt switching and Dr. Melvyn Novas, are you okay taking over pt's care?

## 2022-10-26 NOTE — Telephone Encounter (Signed)
Patient wants to know if appt on 2/20 is needed being that she saw the dr on 1/30. Please advise

## 2022-10-26 NOTE — Telephone Encounter (Signed)
Returned call to patient to review that the appt was due to be done 4 weeks after last appt since Lasix and KCL was started. She had RSV during January visit and we wanted to f/u with labs after making the med changes and see how her symptoms were. She already cancelled the visit with the scheduler by the time of this call. She did accept a later appt to be done 02/09/23. Spoke with husband on DPR who states he will let pt know and have her call back. BMET entered and scheduled and message sent via [employer]. If she will get labs, the later appt may be appropriate.

## 2022-10-26 NOTE — Telephone Encounter (Signed)
Fine with me, bring all meds

## 2022-10-26 NOTE — Telephone Encounter (Signed)
Patient checking on appointment with Dr. Melvyn Novas. Patient phone number is [phone].

## 2022-10-27 NOTE — Telephone Encounter (Signed)
ATC patient. Per DPR, left detailed vm to let patient know Dr. Annamaria Boots and Dr. Melvyn Novas were okay with her switching and to give [place] a call when she can to set up an appointment with Dr. Melvyn Novas.

## 2022-11-01 ENCOUNTER — Ambulatory Visit: Payer: PPO | Admitting: Cardiovascular Disease

## 2022-11-01 ENCOUNTER — Other Ambulatory Visit: Payer: HMO

## 2022-11-01 DIAGNOSIS — Z23 Encounter for immunization: Secondary | ICD-10-CM | POA: Diagnosis not present

## 2022-11-01 DIAGNOSIS — F411 Generalized anxiety disorder: Secondary | ICD-10-CM | POA: Diagnosis not present

## 2022-11-01 DIAGNOSIS — I5030 Unspecified diastolic (congestive) heart failure: Secondary | ICD-10-CM | POA: Diagnosis not present

## 2022-11-01 DIAGNOSIS — E1165 Type 2 diabetes mellitus with hyperglycemia: Secondary | ICD-10-CM | POA: Diagnosis not present

## 2022-11-01 DIAGNOSIS — I10 Essential (primary) hypertension: Secondary | ICD-10-CM | POA: Diagnosis not present

## 2022-11-01 DIAGNOSIS — R059 Cough, unspecified: Secondary | ICD-10-CM | POA: Diagnosis not present

## 2022-11-01 DIAGNOSIS — I700 Atherosclerosis of aorta: Secondary | ICD-10-CM | POA: Diagnosis not present

## 2022-11-01 DIAGNOSIS — I110 Hypertensive heart disease with heart failure: Secondary | ICD-10-CM | POA: Diagnosis not present

## 2022-11-01 DIAGNOSIS — K2100 Gastro-esophageal reflux disease with esophagitis, without bleeding: Secondary | ICD-10-CM | POA: Diagnosis not present

## 2022-11-01 DIAGNOSIS — G319 Degenerative disease of nervous system, unspecified: Secondary | ICD-10-CM | POA: Diagnosis not present

## 2022-11-01 DIAGNOSIS — J439 Emphysema, unspecified: Secondary | ICD-10-CM | POA: Diagnosis not present

## 2022-11-01 DIAGNOSIS — J849 Interstitial pulmonary disease, unspecified: Secondary | ICD-10-CM | POA: Diagnosis not present

## 2022-11-03 ENCOUNTER — Ambulatory Visit: Payer: PPO | Admitting: Internal Medicine

## 2022-11-10 DIAGNOSIS — L570 Actinic keratosis: Secondary | ICD-10-CM | POA: Diagnosis not present

## 2022-11-10 DIAGNOSIS — X32XXXD Exposure to sunlight, subsequent encounter: Secondary | ICD-10-CM | POA: Diagnosis not present

## 2022-11-10 DIAGNOSIS — L820 Inflamed seborrheic keratosis: Secondary | ICD-10-CM | POA: Diagnosis not present

## 2022-11-11 ENCOUNTER — Ambulatory Visit: Payer: PPO | Admitting: Internal Medicine

## 2022-11-14 DIAGNOSIS — M1812 Unilateral primary osteoarthritis of first carpometacarpal joint, left hand: Secondary | ICD-10-CM | POA: Diagnosis not present

## 2022-11-14 NOTE — Progress Notes (Unsigned)
[name], female    DOB: Mar 27, 1941   MRN: [id number]   Brief patient profile:  40  yowm  never smoker  self- referred to pulmonary clinic 11/15/2022  for "bronchitis"  Has   had asthma as child and does have h/o seasonal rhinitis going back to childhood and dx of asthma but no daily symptoms (just intermittent 3-4x per year) until aorund 2009 eval by Annamaria Boots and dx as ashthma "better on symbicort / singulair" referred back to pulmonary 11/18/2013 by Dr Alroy Dust.   Rec  For now rx for gerd, pnds and return to regroup with full med reconciliation/ cxr/  Next step is add neurontin/ complete a MCT to be sure re dx of asthma or not.  >>> did not return until 11/15/2022     History of Present Illness  11/15/2022  Pulmonary/ 1st office eval/Markham Dumlao downhill since Christmas 2023  Chief Complaint  Patient presents with   Follow-up    Pt of Dr Janee Morn- c/o cough for "years". She states she has mucus in her throat "all the time".  Cough tends to be worse at night and wakes her up. She occ prod some minimal yellow sputum. She rarely uses her albuterol.   Dyspnea: baseline  food lion maybe 2 aisles > cardiac eval Feb 2024 neg  Cough: worse at hs immediate and up a lot medication Sleep: bed is flat with a couple  SABA use: duoneb once  Prednisone   Past Medical History:  Diagnosis Date   Aortic atherosclerosis (HCC)    Asthma    Breast CA ([place])    Cancer ([place])    right breast mastectomy   CVA (cerebral vascular accident) ([place]) 04/27/2015   DDD (degenerative disc disease), cervical    Duodenal ulcer    External hemorrhoids    Generalized anxiety disorder    H/O chest pain 03/24/2006   normal nuclear  study   Headache    High cholesterol    Hypertension    Mild aortic insufficiency 03/28/2006   echo   Normal cardiac stress test 2005   Pancreatitis    Personal history of chemotherapy    Personal history of radiation therapy    PONV (postoperative nausea and vomiting)    Sinus bradycardia  on ECG    Stroke [facility])     Outpatient Medications Prior to Visit  Medication Sig Dispense Refill   acetaminophen (TYLENOL) 500 MG tablet Take 500 mg by mouth every 6 (six) hours as needed. As needed for pain     albuterol (VENTOLIN HFA) 108 (90 Base) MCG/ACT inhaler Inhale 2 puffs into the lungs every 4 (four) hours as needed for wheezing or shortness of breath. 18 g 0   amLODipine (NORVASC) 5 MG tablet Take 5 mg by mouth daily.     atorvastatin (LIPITOR) 80 MG tablet Take 1 tablet (80 mg total) by mouth daily at 6 PM. 30 tablet 3   azelastine (ASTELIN) 0.1 % nasal spray Place 1 spray into both nostrils 2 (two) times daily. Use in each nostril as directed 30 mL 2   benzonatate (TESSALON) 200 MG capsule Take 1 capsule (200 mg total) by mouth 3 (three) times daily as needed for cough. 30 capsule 1   clobetasol cream (TEMOVATE) [id number] % Apply 1 application topically daily at 6 (six) AM.     clonazePAM (KLONOPIN) 1 MG tablet Take 1 mg by mouth 2 (two) times daily as needed for anxiety.     clopidogrel (PLAVIX) 75  MG tablet Take 1 tablet (75 mg total) by mouth daily. 90 tablet 2   DULoxetine (CYMBALTA) 30 MG capsule Take 30 mg by mouth daily.     Fluticasone-Umeclidin-Vilant (TRELEGY ELLIPTA) 100-62.5-25 MCG/ACT AEPB Inhale 1 puff into the lungs daily.     furosemide (LASIX) 40 MG tablet Take 1 tablet (40 mg total) by mouth daily. 14 tablet 0   guaiFENesin (MUCINEX) 600 MG 12 hr tablet Take 1 tablet (600 mg total) by mouth 2 (two) times daily as needed. (Patient taking differently: Take 600 mg by mouth 2 (two) times daily as needed for cough.) 20 tablet 0   ipratropium-albuterol (DUONEB) 0.5-2.5 (3) MG/3ML SOLN Take 3 mLs by nebulization every 6 (six) hours as needed. 75 mL 12   isosorbide mononitrate (IMDUR) 30 MG 24 hr tablet TAKE 1 TABLET BY MOUTH DAILY 90 tablet 3   JARDIANCE 10 MG TABS tablet Take 10 mg by mouth daily.     loratadine (CLARITIN) 10 MG tablet Take 10 mg by mouth daily as needed for  allergies.     montelukast (SINGULAIR) 10 MG tablet Take 1 tablet (10 mg total) by mouth at bedtime. 30 tablet 0   nitroGLYCERIN (NITROSTAT) 0.4 MG SL tablet Place 1 tablet (0.4 mg total) under the tongue every 5 (five) minutes as needed for chest pain. 25 tablet 6   ondansetron (ZOFRAN) 4 MG tablet Take 1 tablet (4 mg total) by mouth every 8 (eight) hours as needed for nausea or vomiting. 30 tablet 0   pantoprazole (PROTONIX) 40 MG tablet Take 1 tablet (40 mg total) by mouth daily. (Patient taking differently: Take 40 mg by mouth daily as needed (indigestion).) 90 tablet 3   potassium chloride (KLOR-CON) 10 MEQ tablet Take 1 tablet (10 mEq total) by mouth 2 (two) times daily. 28 tablet 0   metFORMIN (GLUCOPHAGE) 500 MG tablet Take 1 tablet (500 mg total) by mouth daily with breakfast. 30 tablet 0   No facility-administered medications prior to visit.     Objective:     BP (!) 140/74 (BP Location: Left Arm, Cuff Size: Normal)   Pulse 66   Temp (!) 97.4 F (36.3 C) (Oral)   Ht '5\' 5"'$  (1.651 m)   Wt 146 lb (66.2 kg)   SpO2 98% Comment: on RA  BMI 24.30 kg/m   SpO2: 98 % (on RA)      Assessment   No problem-specific Assessment & Plan notes found for this encounter.     Christinia Gully, MD 11/15/2022

## 2022-11-15 ENCOUNTER — Ambulatory Visit (INDEPENDENT_AMBULATORY_CARE_PROVIDER_SITE_OTHER): Payer: HMO | Admitting: Internal Medicine

## 2022-11-15 ENCOUNTER — Encounter: Payer: Self-pay | Admitting: Internal Medicine

## 2022-11-15 VITALS — BP 140/74 | HR 66 | Temp 97.4°F | Ht 65.0 in | Wt 146.0 lb

## 2022-11-15 DIAGNOSIS — R058 Other specified cough: Secondary | ICD-10-CM

## 2022-11-15 MED ORDER — FAMOTIDINE 20 MG PO TABS: ORAL_TABLET | ORAL | 11 refills | Status: DC

## 2022-11-15 NOTE — Patient Instructions (Addendum)
Stop all inhalers and just use the duoneb up to 4 x daily   Pantoprazole 40 mg  Take 30-60 min before first meal of the day  and add pepcid 20   GERD (REFLUX)  is an extremely common cause of respiratory symptoms just like yours , many times with no obvious heartburn at all.    It can be treated with medication, but also with lifestyle changes including elevation of the head of your bed (ideally with 6 -8inch blocks under the headboard of your bed),  Smoking cessation, avoidance of late meals, excessive alcohol, and avoid fatty foods, chocolate, peppermint, colas, red wine, and acidic juices such as orange juice.  NO MINT OR MENTHOL PRODUCTS SO NO COUGH DROPS  USE SUGARLESS CANDY INSTEAD (Jolley ranchers or Stover's or Life Savers) or even ice chips will also do - the key is to swallow to prevent all throat clearing. NO OIL BASED VITAMINS - use powdered substitutes.  Avoid fish oil when coughing.   For drainage / throat tickle stop CLARITON  try take CHLORPHENIRAMINE  4 mg  ("Allergy Relief" '4mg'$   at [facility] should be easiest to find in the blue box usually on bottom shelf)  take one every 4 hours as needed - extremely effective and inexpensive over the counter- may cause drowsiness so start with just a dose or two an hour before bedtime and see how you tolerate it before trying in daytime.   Return  with all medications /inhalers/ solutions in hand so we can verify exactly what you are taking. This includes all medications from all doctors and over the [place] separate them into two bags:  the ones you take automatically, no matter what, vs the ones you take just when you feel you need them "BAG #2 is UP TO YOU"  - this will really help [place] help you take your medications more effectively.   Please schedule a follow up office visit in 3 weeks, sooner if needed - [place]

## 2022-11-16 ENCOUNTER — Encounter: Payer: Self-pay | Admitting: Internal Medicine

## 2022-11-16 NOTE — Assessment & Plan Note (Addendum)
Onset ? 2009 but worse since xmas 2023 while on trelegy > d/c 11/15/2022   Upper airway cough syndrome (previously labeled PNDS),  is so named because it's frequently impossible to sort out how much is  CR/sinusitis with freq throat clearing (which can be related to primary GERD)   vs  causing  secondary (" extra esophageal")  GERD from wide swings in gastric pressure that occur with throat clearing, often  promoting self use of mint and menthol lozenges that reduce the lower esophageal sphincter tone and exacerbate the problem further in a cyclical fashion.   These are the same pts (now being labeled as having "irritable larynx syndrome" by some cough centers) who not infrequently have a history of having failed to tolerate ace inhibitors,  dry powder inhalers or biphosphonates or report having atypical/extraesophageal reflux symptoms that don't respond to standard doses of PPI  and are easily confused as having aecopd or asthma flares by even experienced allergists/ pulmonologists (myself included).   Will try off trlegy and just rx airways with duoneb up to qid short term since least likely bronchodilators to aggravate UACS in my experience.   Rec trial of max gerd rx (24/7) plus 1st gen H1 blockers per guidelines (instead of clariton to address sense of pnds) and f/u in 3 weeks with all meds in hand using a trust but verify approach to confirm accurate Medication  Reconciliation The principal here is that until we are certain that the  patients are doing what we've asked, it makes no sense to ask them to do more.          Each maintenance medication was reviewed in detail including emphasizing most importantly the difference between maintenance and prns and under what circumstances the prns are to be triggered using an action plan format where appropriate.  Total time for H and P, chart review, counseling, reviewing neb  device(s) and generating customized AVS unique to this office visit / same day  charting  > 40 min with pt not seen since 2015 and with   refractory respiratory  symptoms of uncertain etiology

## 2022-12-10 ENCOUNTER — Other Ambulatory Visit: Payer: Self-pay | Admitting: Family Medicine

## 2022-12-21 NOTE — Progress Notes (Unsigned)
[name], female    DOB: 11/18/1940   MRN: [id number]   Brief patient profile:   58  yowm  never smoker  self- referred to pulmonary clinic 11/15/2022  for "bronchitis"  Had asthma as child and does have h/o seasonal rhinitis going back to childhood   but no daily asthma symptoms (just intermittent 3-4x per year need for saba) until aorund 2009 eval by [name] and dx as ashthma "better on symbicort / singulair" referred back to pulmonary 11/18/2013 by [name].   Rec  For now rx for gerd, pnds and return to regroup with full med reconciliation/ cxr/  Next step is add neurontin/ complete a MCT to be sure re dx of asthma or not.  >>> did not return until 11/15/2022     History of Present Illness  11/15/2022  Pulmonary/ 1st office eval/[name] downhill since Christmas 2023 / maint on Trelegy  Chief Complaint  Patient presents with   Follow-up    Pt of Dr [name]- c/o cough for "years". She states she has mucus in her throat "all the time".  Cough tends to be worse at night and wakes her up. She occ prod some minimal yellow sputum. She rarely uses her albuterol.   Dyspnea: baseline  food lion maybe 2 aisles > cardiac eval Feb 2024 neg  Cough: worse at hs immediate and up a lot each night  to take medications for cough > min yellow mucus assoc with sense of excess pnds Sleep: bed is flat with a couple of pillows SABA use: duoneb once a day seems to help more than anything else Prednisone doesn't really help the chronic cough Rec Stop all inhalers and just use the duoneb up to 4 x daily  Pantoprazole 40 mg  Take 30-60 min before first meal of the day  and add pepcid 20  GERD diet reviewed, bed blocks rec  For drainage / throat tickle stop CLARITON  try take CHLORPHENIRAMINE  4 mg    Return  with all medications /inhalers/ solutions in hand       12/23/2022  f/u ov/[name] office/[name] re: intermittent asthma/cough maint on ppi not ac and did not bring all meds   Chief Complaint  Patient  presents with   Follow-up    Pt f/u states that he cough is better, does have some constant mucus in her throat that is yellow colored   Dyspnea:  food lion better, no longer on trelegy or nebs (make her worse)  Cough: sensation of globus, min yellow p lots of throat clearing  Sleeping: flat bed / 2 pillows / wedge / no noct resp cc  SABA use: neb chokes her  02: none      No obvious day to day or daytime variability or assoc excess/ purulent sputum or mucus plugs or hemoptysis or cp or chest tightness, subjective wheeze or overt sinus or hb symptoms.   Sleeping  without nocturnal  or early am exacerbation  of respiratory  c/o's or need for noct saba. Also denies any obvious fluctuation of symptoms with weather or environmental changes or other aggravating or alleviating factors except as outlined above   No unusual exposure hx or h/o childhood pna  or knowledge of premature birth.  Current Allergies, Complete Past Medical History, Past Surgical History, Family History, and Social History were reviewed in [name].  ROS  The following are not active complaints unless bolded Hoarseness, sore throat, dysphagia, dental problems, itching,  sneezing,  nasal congestion or discharge of excess mucus or purulent secretions, ear ache,   fever, chills, sweats, unintended wt loss or wt gain, classically pleuritic or exertional cp,  orthopnea pnd or arm/hand swelling  or leg swelling, presyncope, palpitations, abdominal pain, anorexia, nausea, vomiting, diarrhea  or change in bowel habits or change in bladder habits, change in stools or change in urine, dysuria, hematuria,  rash, arthralgias, visual complaints, headache, numbness, weakness or ataxia or problems with walking or coordination,  change in mood or  memory.        Current Meds  Medication Sig   acetaminophen (TYLENOL) 500 MG tablet Take 500 mg by mouth every 6 (six) hours as needed. As needed for pain    amLODipine (NORVASC) 5 MG tablet Take 5 mg by mouth daily.   atorvastatin (LIPITOR) 80 MG tablet Take 1 tablet (80 mg total) by mouth daily at 6 PM.   azelastine (ASTELIN) 0.1 % nasal spray Place 1 spray into both nostrils 2 (two) times daily. Use in each nostril as directed   clopidogrel (PLAVIX) 75 MG tablet Take 1 tablet (75 mg total) by mouth daily.   DULoxetine (CYMBALTA) 30 MG capsule Take 30 mg by mouth daily.   famotidine (PEPCID) 20 MG tablet One after supper   guaiFENesin (MUCINEX) 600 MG 12 hr tablet Take 1 tablet (600 mg total) by mouth 2 (two) times daily as needed. (Patient taking differently: Take 600 mg by mouth 2 (two) times daily as needed for cough.)   isosorbide mononitrate (IMDUR) 30 MG 24 hr tablet TAKE 1 TABLET BY MOUTH DAILY   JARDIANCE 10 MG TABS tablet Take 10 mg by mouth daily.   metFORMIN (GLUCOPHAGE) 500 MG tablet Take 1 tablet (500 mg total) by mouth daily with breakfast.   montelukast (SINGULAIR) 10 MG tablet Take 1 tablet (10 mg total) by mouth at bedtime.   ondansetron (ZOFRAN) 4 MG tablet Take 1 tablet (4 mg total) by mouth every 8 (eight) hours as needed for nausea or vomiting.   pantoprazole (PROTONIX) 40 MG tablet Take 1 tablet (40 mg total) by mouth daily. (Patient taking differently: Take 40 mg by mouth daily as needed (indigestion).)              Past Medical History:  Diagnosis Date   Aortic atherosclerosis (HCC)    Asthma    Breast CA (HCC)    Cancer (HCC)    right breast mastectomy   CVA (cerebral vascular accident) (HCC) 04/27/2015   DDD (degenerative disc disease), cervical    Duodenal ulcer    External hemorrhoids    Generalized anxiety disorder    H/O chest pain 03/24/2006   normal nuclear  study   Headache    High cholesterol    Hypertension    Mild aortic insufficiency 03/28/2006   echo   Normal cardiac stress test 2005   Pancreatitis    Personal history of chemotherapy    Personal history of radiation therapy    PONV  (postoperative nausea and vomiting)    Sinus bradycardia on ECG    Stroke (HCC)         Objective:    Wt Readings from Last 3 Encounters:  12/23/22 142 lb 12.8 oz (64.8 kg)  11/15/22 146 lb (66.2 kg)  10/11/22 151 lb (68.5 kg)      Vital signs reviewed  12/23/2022  - Note at rest 02 sats  96% on RA   General appearance:  pleasant amb wf freq throat clearing   HEENT : Oropharynx  clear/ no pnds or cobblestoning     Nasal turbinates nl    NECK :  without  apparent JVD/ palpable Nodes/TM    LUNGS: no acc muscle use,  Nl contour chest which is clear to A and P bilaterally without cough on insp or exp maneuvers   CV:  RRR  no s3 or murmur or increase in P2, and no edema   ABD:  soft and nontender with nl inspiratory excursion in the supine position. No bruits or organomegaly appreciated   MS:  Nl gait/ ext warm without deformities Or obvious joint restrictions  calf tenderness, cyanosis or clubbing    SKIN: warm and dry without lesions    NEURO:  alert, approp, nl sensorium with  no motor or cerebellar deficits apparent.       I personally reviewed images and agree with radiology impression as follows:  CXR:   pa and lateral 10/20/22 1. Chronic interstitial lung disease suspected with no superimposed acute cardiopulmonary abnormality. 2. Aortic Atherosclerosis (ICD10-I70.0) My review: c/w recovering phase of viral pna which was the clinical dx   Sinus CT 10/02/22  wnl        Assessment

## 2022-12-23 ENCOUNTER — Encounter: Payer: Self-pay | Admitting: Internal Medicine

## 2022-12-23 ENCOUNTER — Ambulatory Visit (INDEPENDENT_AMBULATORY_CARE_PROVIDER_SITE_OTHER): Payer: HMO | Admitting: Internal Medicine

## 2022-12-23 VITALS — BP 140/75 | HR 75 | Ht 65.0 in | Wt 142.8 lb

## 2022-12-23 DIAGNOSIS — R058 Other specified cough: Secondary | ICD-10-CM

## 2022-12-23 DIAGNOSIS — R1013 Epigastric pain: Secondary | ICD-10-CM

## 2022-12-23 MED ORDER — FAMOTIDINE 20 MG PO TABS: ORAL_TABLET | ORAL | 11 refills | Status: DC

## 2022-12-23 MED ORDER — PANTOPRAZOLE SODIUM 40 MG PO TBEC: DELAYED_RELEASE_TABLET | ORAL | Status: AC

## 2022-12-23 NOTE — Patient Instructions (Addendum)
Pantoprazole (protonix) 40 mg   Take  30-60 min before first meal of the day and add Pepcid (famotidine)  20 mg after supper until return to office - this is the best way to tell whether stomach acid is contributing to your problem.  For drainage / throat tickle try take CHLORPHENIRAMINE  4 mg  ("Allergy Relief" 4mg   at [facility] should be easiest to find in the blue box usually on bottom shelf)  take one every 4 hours as needed - extremely effective and inexpensive over the counter- may cause drowsiness so start with just a dose or two an hour before bedtime and see how you tolerate it before trying in daytime.   If not improving your throat clearing you will need to see a throat specialist from [facility] either in [place] or [place] (prefer [place])    Please schedule a follow up office visit in 8 weeks, call sooner if needed with all medications /inhalers/ solutions in hand so we can verify exactly what you are taking. This includes all medications from all doctors and over the counters - PLEASE separate them into two bags:  the ones you take automatically, no matter what, vs the ones you take just when you feel you need them "BAG #2 is UP TO YOU"  - this will really help [place] help you take your medications more effectively.

## 2022-12-24 ENCOUNTER — Encounter: Payer: Self-pay | Admitting: Internal Medicine

## 2022-12-24 NOTE — Assessment & Plan Note (Addendum)
Onset ? 2009 but worse since xmas 2023 while on trelegy > d/c 11/15/2022  - Sinus CT 10/02/22  wnl  - much improved 12/23/2022 but still throat clearing on ppi but not ac and no H2  No evidence of asthma here so far - note actually better off inhalers typical of Upper airway cough syndrome (previously labeled PNDS),  is so named because it's frequently impossible to sort out how much is  CR/sinusitis with freq throat clearing (which can be related to primary GERD)   vs  causing  secondary (" extra esophageal")  GERD from wide swings in gastric pressure that occur with throat clearing, often  promoting self use of mint and menthol lozenges that reduce the lower esophageal sphincter tone and exacerbate the problem further in a cyclical fashion.   These are the same pts (now being labeled as having "irritable larynx syndrome" by some cough centers) who not infrequently have a history of having failed to tolerate ace inhibitors,  dry powder inhalers or biphosphonates or report having atypical/extraesophageal reflux symptoms that don't respond to standard doses of PPI  and are easily confused as having aecopd or asthma flares by even experienced allergists/ pulmonologists (myself included).   Rec  Max gerd rx and 1st gen H1 blockers per guidelines   Next step eval by ENT / prefer Dr Delford Field at [facility] (vs gabapentin trial)          Each maintenance medication was reviewed in detail including emphasizing most importantly the difference between maintenance and prns and under what circumstances the prns are to be triggered using an action plan format where appropriate.  Total time for H and P, chart review, counseling,  and generating customized AVS unique to this office visit / same day charting = > 30 min for  refractory respiratory  symptoms of uncertain etiology

## 2023-01-16 DIAGNOSIS — H524 Presbyopia: Secondary | ICD-10-CM | POA: Diagnosis not present

## 2023-01-16 DIAGNOSIS — Z961 Presence of intraocular lens: Secondary | ICD-10-CM | POA: Diagnosis not present

## 2023-01-16 DIAGNOSIS — E119 Type 2 diabetes mellitus without complications: Secondary | ICD-10-CM | POA: Diagnosis not present

## 2023-01-22 ENCOUNTER — Encounter (HOSPITAL_BASED_OUTPATIENT_CLINIC_OR_DEPARTMENT_OTHER): Payer: Self-pay | Admitting: Emergency Medicine

## 2023-01-22 ENCOUNTER — Other Ambulatory Visit: Payer: Self-pay

## 2023-01-22 ENCOUNTER — Emergency Department (HOSPITAL_BASED_OUTPATIENT_CLINIC_OR_DEPARTMENT_OTHER)
Admission: EM | Admit: 2023-01-22 | Discharge: 2023-01-22 | Disposition: A | Discharge status: Home / Self Care | Payer: HMO | Attending: Emergency Medicine | Admitting: Emergency Medicine

## 2023-01-22 DIAGNOSIS — Z853 Personal history of malignant neoplasm of breast: Secondary | ICD-10-CM | POA: Insufficient documentation

## 2023-01-22 DIAGNOSIS — Z7902 Long term (current) use of antithrombotics/antiplatelets: Secondary | ICD-10-CM | POA: Insufficient documentation

## 2023-01-22 DIAGNOSIS — N3000 Acute cystitis without hematuria: Secondary | ICD-10-CM | POA: Insufficient documentation

## 2023-01-22 DIAGNOSIS — Z79899 Other long term (current) drug therapy: Secondary | ICD-10-CM | POA: Insufficient documentation

## 2023-01-22 DIAGNOSIS — R350 Frequency of micturition: Secondary | ICD-10-CM | POA: Diagnosis present

## 2023-01-22 DIAGNOSIS — J45909 Unspecified asthma, uncomplicated: Secondary | ICD-10-CM | POA: Insufficient documentation

## 2023-01-22 DIAGNOSIS — I10 Essential (primary) hypertension: Secondary | ICD-10-CM | POA: Diagnosis not present

## 2023-01-22 LAB — URINALYSIS, ROUTINE W REFLEX MICROSCOPIC
Bacteria, UA: NONE SEEN
Glucose, UA: NEGATIVE mg/dL
Ketones, ur: NEGATIVE mg/dL
Nitrite: POSITIVE — AB
Protein, ur: 30 mg/dL — AB
RBC / HPF: >50 RBC/hpf (ref 0–5)
Specific Gravity, Urine: 1.016 (ref 1.005–1.030)
WBC, UA: >50 WBC/hpf (ref 0–5)
pH: 5.5 (ref 5.0–8.0)

## 2023-01-22 MED ORDER — CEPHALEXIN 500 MG PO CAPS: 500.0000 mg | ORAL_CAPSULE | Freq: Three times a day (TID) | ORAL | 0 refills | Status: AC

## 2023-01-22 MED ORDER — PHENAZOPYRIDINE HCL 200 MG PO TABS: 200.0000 mg | ORAL_TABLET | Freq: Three times a day (TID) | ORAL | 0 refills | Status: DC

## 2023-01-22 MED ORDER — CEPHALEXIN 250 MG PO CAPS
500.0000 mg | ORAL_CAPSULE | Freq: Once | ORAL | Status: AC
  Administered 2023-01-22: 500 mg via ORAL
  Filled 2023-01-22: qty 2

## 2023-01-22 MED ORDER — PHENAZOPYRIDINE HCL 100 MG PO TABS
95.0000 mg | ORAL_TABLET | Freq: Once | ORAL | Status: AC
  Administered 2023-01-22: 100 mg via ORAL
  Filled 2023-01-22: qty 1

## 2023-01-22 NOTE — ED Triage Notes (Signed)
Pt via pov from home with urinary symptoms that began this morning. Pt reports burning, frequency, urgency. Denies fevers or hematuria. Pt has taken AZO today for symptoms. Pt alert & oriented, nad noted.

## 2023-01-22 NOTE — ED Provider Notes (Signed)
[place] EMERGENCY DEPARTMENT AT [facility] Provider Note   CSN: [id number] Arrival date & time: 01/22/23  1915     History  Chief Complaint  Patient presents with   Urinary Frequency    [name] is a 82 y.o. female.   Urinary Frequency   Patient has a history of aortic insufficiency, degenerative disc disease, asthma, hypertension, breast cancer, stroke who presents to the ED with complaints of urinary discomfort.  Patient states she started having symptoms today.  She is having pain in her vaginal area.  She is having burning with urination.  She is having urgency.  She denies any fevers.  No back pain.  No vomiting or diarrhea.    Home Medications Prior to Admission medications   Medication Sig Start Date End Date Taking? Authorizing Provider  acetaminophen (TYLENOL) 500 MG tablet Take 500 mg by mouth every 6 (six) hours as needed. As needed for pain    [provider]  amLODipine (NORVASC) 5 MG tablet Take 5 mg by mouth daily.    [provider]  atorvastatin (LIPITOR) 80 MG tablet Take 1 tablet (80 mg total) by mouth daily at 6 PM. 07/06/15   Marvel Plan, MD  azelastine (ASTELIN) 0.1 % nasal spray Place 1 spray into both nostrils 2 (two) times daily. Use in each nostril as directed 09/15/22   Particia Nearing, PA-C  clopidogrel (PLAVIX) 75 MG tablet Take 1 tablet (75 mg total) by mouth daily. 06/01/17   Marvel Plan, MD  DULoxetine (CYMBALTA) 30 MG capsule Take 30 mg by mouth daily. 05/05/21   [provider]  famotidine (PEPCID) 20 MG tablet One after supper 12/23/22   Nyoka Cowden, MD  guaiFENesin (MUCINEX) 600 MG 12 hr tablet Take 1 tablet (600 mg total) by mouth 2 (two) times daily as needed. Patient taking differently: Take 600 mg by mouth 2 (two) times daily as needed for cough. 09/20/22   Particia Nearing, PA-C  isosorbide mononitrate (IMDUR) 30 MG 24 hr tablet TAKE 1 TABLET BY MOUTH DAILY 07/07/22   [name], Deloris Ping,  MD  JARDIANCE 10 MG TABS tablet Take 10 mg by mouth daily. 09/21/22   [provider]  metFORMIN (GLUCOPHAGE) 500 MG tablet Take 1 tablet (500 mg total) by mouth daily with breakfast. 10/05/22 12/23/22  Lanae Boast, MD  montelukast (SINGULAIR) 10 MG tablet Take 1 tablet (10 mg total) by mouth at bedtime. 05/29/21   Leroy Sea, MD  ondansetron (ZOFRAN) 4 MG tablet Take 1 tablet (4 mg total) by mouth every 8 (eight) hours as needed for nausea or vomiting. 02/01/22   [name], MD  pantoprazole (PROTONIX) 40 MG tablet Take 30-60 min before first meal of the day 12/23/22   Nyoka Cowden, MD      Allergies    Ciprofloxacin, Codeine, Doxycycline, Percocet [oxycodone-acetaminophen], and Tramadol    Review of Systems   Review of Systems  Genitourinary:  Positive for frequency.    Physical Exam Updated Vital Signs BP (!) 142/72 (BP Location: Left Arm)   Pulse 79   Temp (!) 97.5 F (36.4 C) (Oral)   Resp 18   Ht 1.626 m (5\' 4" )   Wt 65.3 kg   SpO2 96%   BMI 24.72 kg/m  Physical Exam Vitals and nursing note reviewed.  Constitutional:      General: She is not in acute distress.    Appearance: She is well-developed.  HENT:  Head: Normocephalic and atraumatic.     Right Ear: External ear normal.     Left Ear: External ear normal.  Eyes:     General: No scleral icterus.       Right eye: No discharge.        Left eye: No discharge.     Conjunctiva/sclera: Conjunctivae normal.  Neck:     Trachea: No tracheal deviation.  Cardiovascular:     Rate and Rhythm: Normal rate.  Pulmonary:     Effort: Pulmonary effort is normal. No respiratory distress.     Breath sounds: No stridor.  Abdominal:     General: There is no distension.     Tenderness: There is no right CVA tenderness or left CVA tenderness.  Musculoskeletal:        General: No swelling or deformity.     Cervical back: Neck supple.  Skin:    General: Skin is warm and dry.     Findings: No rash.   Neurological:     Mental Status: She is alert. Mental status is at baseline.     Cranial Nerves: No dysarthria or facial asymmetry.     Motor: No seizure activity.     ED Results / Procedures / Treatments   Labs (all labs ordered are listed, but only abnormal results are displayed) Labs Reviewed  URINALYSIS, ROUTINE W REFLEX MICROSCOPIC - Abnormal; Notable for the following components:      Result Value   Color, Urine ORANGE (*)    APPearance HAZY (*)    Hgb urine dipstick LARGE (*)    Bilirubin Urine SMALL (*)    Protein, ur 30 (*)    Nitrite POSITIVE (*)    Leukocytes,Ua LARGE (*)    Non Squamous Epithelial 6-10 (*)    All other components within normal limits  URINE CULTURE    EKG None  Radiology No results found.  Procedures Procedures    Medications Ordered in ED Medications  cephALEXin (KEFLEX) capsule 500 mg (500 mg Oral Given 01/22/23 2002)  phenazopyridine (PYRIDIUM) tablet 100 mg (100 mg Oral Given 01/22/23 2002)    ED Course/ Medical Decision Making/ A&P Clinical Course as of 01/22/23 2005  Sun Jan 22, 2023  1956 Urinalysis consistent with urinary tract infection [JK]    Clinical Course User Index [JK] Linwood Dibbles, MD                             Medical Decision Making Problems Addressed: Acute cystitis without hematuria: acute illness or injury  Amount and/or Complexity of Data Reviewed Labs: ordered. Decision-making details documented in ED Course.   Patient presents ED for evaluation of urinary discomfort.  Symptoms are suggestive of urinary tract infection.  She is not having any fevers chills abdominal pain or back pain.  No findings to suggest systemic infection or pyelonephritis.  Will discharge home with a course of antibiotics.  Outpatient follow-up with PCP to have her urine rechecked once treatment is complete warning signs precautions discussed.        Final Clinical Impression(s) / ED Diagnoses Final diagnoses:  Acute  cystitis without hematuria    Rx / DC Orders ED Discharge Orders     None         Linwood Dibbles, MD 01/22/23 2005

## 2023-01-25 LAB — URINE CULTURE: Culture: >=100000 — AB

## 2023-01-26 ENCOUNTER — Telehealth (HOSPITAL_BASED_OUTPATIENT_CLINIC_OR_DEPARTMENT_OTHER): Payer: Self-pay | Admitting: *Deleted

## 2023-01-26 NOTE — Telephone Encounter (Signed)
Post ED Visit - Positive Culture Follow-up  Culture report reviewed by antimicrobial stewardship pharmacist: [facility] [x]  Vernetta Honey, Pharm.D. []  Celedonio Miyamoto, Pharm.D., BCPS AQ-ID []  Garvin Fila, Pharm.D., BCPS []  Georgina Pillion, Pharm.D., BCPS []  [place], [address].D., BCPS, AAHIVP []  Estella Husk, Pharm.D., BCPS, AAHIVP []  Lysle Pearl, PharmD, BCPS []  Phillips Climes, PharmD, BCPS []  Agapito Games, PharmD, BCPS []  Verlan Friends, PharmD []  Mervyn Gay, PharmD, BCPS []  Vinnie Level, PharmD  [facility] []  Len Childs, PharmD []  Greer Pickerel, PharmD []  Adalberto Cole, PharmD []  [name], Rph []  Lonell Face) Jean Rosenthal, PharmD []  Earl Many, PharmD []  Junita Push, PharmD []  Dorna Leitz, PharmD []  Terrilee Files, PharmD []  Lynann Beaver, PharmD []  Keturah Barre, PharmD []  Loralee Pacas, PharmD []  Bernadene Person, PharmD   Positive urine  culture Treated with cephalexin, organism sensitive to the same and no further patient follow-up is required at this time.  [name], [name] 01/26/2023, 9:17 AM

## 2023-01-31 DIAGNOSIS — I5030 Unspecified diastolic (congestive) heart failure: Secondary | ICD-10-CM | POA: Diagnosis not present

## 2023-01-31 DIAGNOSIS — D692 Other nonthrombocytopenic purpura: Secondary | ICD-10-CM | POA: Diagnosis not present

## 2023-01-31 DIAGNOSIS — I110 Hypertensive heart disease with heart failure: Secondary | ICD-10-CM | POA: Diagnosis not present

## 2023-01-31 DIAGNOSIS — R059 Cough, unspecified: Secondary | ICD-10-CM | POA: Diagnosis not present

## 2023-01-31 DIAGNOSIS — I10 Essential (primary) hypertension: Secondary | ICD-10-CM | POA: Diagnosis not present

## 2023-01-31 DIAGNOSIS — K219 Gastro-esophageal reflux disease without esophagitis: Secondary | ICD-10-CM | POA: Diagnosis not present

## 2023-01-31 DIAGNOSIS — F411 Generalized anxiety disorder: Secondary | ICD-10-CM | POA: Diagnosis not present

## 2023-01-31 DIAGNOSIS — I7774 Dissection of vertebral artery: Secondary | ICD-10-CM | POA: Diagnosis not present

## 2023-01-31 DIAGNOSIS — R5383 Other fatigue: Secondary | ICD-10-CM | POA: Diagnosis not present

## 2023-01-31 DIAGNOSIS — I700 Atherosclerosis of aorta: Secondary | ICD-10-CM | POA: Diagnosis not present

## 2023-01-31 DIAGNOSIS — J849 Interstitial pulmonary disease, unspecified: Secondary | ICD-10-CM | POA: Diagnosis not present

## 2023-01-31 DIAGNOSIS — R110 Nausea: Secondary | ICD-10-CM | POA: Diagnosis not present

## 2023-01-31 DIAGNOSIS — E1165 Type 2 diabetes mellitus with hyperglycemia: Secondary | ICD-10-CM | POA: Diagnosis not present

## 2023-02-09 ENCOUNTER — Ambulatory Visit: Payer: PPO | Admitting: Cardiovascular Disease

## 2023-02-09 ENCOUNTER — Ambulatory Visit: Payer: HMO | Admitting: Cardiovascular Disease

## 2023-02-17 ENCOUNTER — Ambulatory Visit: Payer: HMO | Admitting: Internal Medicine

## 2023-03-01 ENCOUNTER — Ambulatory Visit: Payer: HMO | Attending: Cardiovascular Disease | Admitting: Cardiovascular Disease

## 2023-03-01 ENCOUNTER — Encounter: Payer: Self-pay | Admitting: Cardiovascular Disease

## 2023-03-01 VITALS — BP 140/72 | HR 66 | Ht 65.0 in | Wt 144.0 lb

## 2023-03-01 DIAGNOSIS — Z79899 Other long term (current) drug therapy: Secondary | ICD-10-CM

## 2023-03-01 DIAGNOSIS — R0602 Shortness of breath: Secondary | ICD-10-CM

## 2023-03-01 DIAGNOSIS — R062 Wheezing: Secondary | ICD-10-CM | POA: Diagnosis not present

## 2023-03-01 MED ORDER — FUROSEMIDE 40 MG PO TABS: 40.0000 mg | ORAL_TABLET | Freq: Every day | ORAL | 3 refills | Status: DC

## 2023-03-01 MED ORDER — POTASSIUM CHLORIDE ER 10 MEQ PO TBCR: 10.0000 meq | EXTENDED_RELEASE_TABLET | Freq: Two times a day (BID) | ORAL | 3 refills | Status: DC

## 2023-03-01 MED ORDER — DAPAGLIFLOZIN PROPANEDIOL 10 MG PO TABS: 10.0000 mg | ORAL_TABLET | Freq: Every day | ORAL | 3 refills | Status: DC

## 2023-03-01 MED ORDER — ALBUTEROL SULFATE HFA 108 (90 BASE) MCG/ACT IN AERS: INHALATION_SPRAY | RESPIRATORY_TRACT | 3 refills | Status: DC

## 2023-03-01 NOTE — Progress Notes (Signed)
Cardiology Office Note:    Date:  03/01/2023   ID:  JENNILEE CULLOP, DOB 1941/07/29, MRN [id number]  PCP:  Irven Coe, MD  Cardiologist:  Kristeen Miss, MD    Referring MD: Irven Coe, MD   Chief Complaint  Patient presents with   Coronary Artery Disease        aortic insufficiency   Problem list 1. Shortness of breath with exertion - Asthma  2. Hyperlipidemia 3. Hypertension 4. CVA - on Plavix       COROLYN HASHBARGER is a 82 y.o. female with a hx of DOE Hx of TIA years ago, was started on Plavix at that time Has no energy.   Gets short of breath walking to the mailbox and back  ( 100 yards)  No CP or tightness.    No syncope.    Has had a chronic cough / throat clearing . Had blood work at primary MD Clovis Riley)   Has significant vertebral [name] arterial disease. Dr. Roda Shutters has given her a minimum  BP goal of 120-150  Dr. Ledell Peoples records from [place] reviewed  Sept. 5, 2019:  Seen for DOE and generalized fatigue Went to [facility] pulmonary and was told she had asthma Takes her inhalers.  Went to her primary care for her fatigue - was told to decrease her carbs   Denies any chest pain  Has headaches and has some dizziness ( history of a TIA in the past )  Does not get any regular exercise   Aug. 25, 2020   Has had worsening DOE recently  Similar to last year  Having severe DOE  Has seen pulmonary md in [place]   Complains of chest tightness.  I hear no wheezing on exam today   Oct. 30, 2020   Alaire is seen today for follow-up of her severe shortness of breath with exertion.  We performed a right and left heart catheterization on her on March 29, 2019. The mid LAD had a 30% stenosis.  Fed normal left ventricular end-diastolic pressure with an EDP of 6 mmHg.  The left ventricular systolic function was normal with an ejection fraction of 55 to 65%. Right heart pressures were unremarkable.  Mean PA pressure was 40 mmHg.  Pulmonary capillary wedge  pressure was 5.  Cardiac output was 7.3 L/min.  Feeling some better.   Still does not have the breathing capacity .  Has been walking some.   Seems to feel better on the Imdur   February 12, 2020: Kylinn is seen today for follow-up of her shortness of breath.  We performed a heart catheterization in July, 2020.   Their son is still in the hospital following surgery on his spine.   Lots of complications / infection Stressed but is otherwise doing well from a cardiac standpoint   Sept. 22, 2022 Was just hospitalized for CHF and pneumonia Echocardiogram from May 26, 2021 reveals normal left ventricular systolic function.  She has grade 1 diastolic dysfunction. Mild AI. Was treaded with lasix and leviquin   Feeling a bit better.   Oct. 10, 2023 Alaiyah is seen for follow up of her dyspnea Breathing is going ok   Jan. 30, 2024 Reida is seen for follow up of her dyspnea.  Was admitted to the hospital on Jan. 20 with dehydration following an influenza infection  Was treated for atypical pneumonia ,  was found to also have RSV on 1/21  She was told to follow  up with cardiology  Echo shows supranormal LV systolic function, with grade I DD  BNP is normal at 104.8 Troponin levels are 12,12,27,20  C/w increased demand ischemia    She has been since before Christmas  Has had various infections   June, 19, 2024  Akire is seen for follow up of her dyspnea  No cp,  Has chronic dyspnea and DOE  Is very fatigued ,  sleep quite a bit  Has DOE  Difficult to climb to the 3rd floor  Echo from Jan. 2024  Normal LV function   - 70-75% Grade I DD  Mild AI  We had some concerns about whether or not this was an  anginal equivalent  Cath from Sept. 2020 shows mild mid LAD stenosis   She has rales in both bases.  We will restart her Lasix and potassium.  Will also restart Marcelline Deist which will be helpful for her diastolic congestive heart failure and her diabetes.  She is actively  wheezing today.  Will start her on albuterol metered-dose inhaler 2 puffs every 6 hours as needed.   Past Medical History:  Diagnosis Date   Aortic atherosclerosis (HCC)    Asthma    Breast CA (HCC)    Cancer (HCC)    right breast mastectomy   CVA (cerebral vascular accident) (HCC) 04/27/2015   DDD (degenerative disc disease), cervical    Duodenal ulcer    External hemorrhoids    Generalized anxiety disorder    H/O chest pain 03/24/2006   normal nuclear  study   Headache    High cholesterol    Hypertension    Mild aortic insufficiency 03/28/2006   echo   Normal cardiac stress test 2005   Pancreatitis    Personal history of chemotherapy    Personal history of radiation therapy    PONV (postoperative nausea and vomiting)    Sinus bradycardia on ECG    Stroke [facility])     Past Surgical History:  Procedure Laterality Date   ABDOMINAL HYSTERECTOMY     APPENDECTOMY     "30 years ago"   childbirth     x 2   CHOLECYSTECTOMY     "15 years ago"   ESOPHAGOGASTRODUODENOSCOPY N/A 03/04/2014   Procedure: ESOPHAGOGASTRODUODENOSCOPY (EGD);  Surgeon: Theda Belfast, MD;  Location: [facility] ENDOSCOPY;  Service: Endoscopy;  Laterality: N/A;   MASTECTOMY  1993   right breast   RIGHT/LEFT HEART CATH AND CORONARY ANGIOGRAPHY N/A 05/30/2019   Procedure: RIGHT/LEFT HEART CATH AND CORONARY ANGIOGRAPHY;  Surgeon: Corky Crafts, MD;  Location: [facility] INVASIVE CV LAB;  Service: Cardiovascular;  Laterality: N/A;   TUBAL LIGATION      Current Medications: Current Meds  Medication Sig   acetaminophen (TYLENOL) 500 MG tablet Take 500 mg by mouth every 6 (six) hours as needed. As needed for pain   albuterol (VENTOLIN HFA) 108 (90 Base) MCG/ACT inhaler Use 2 puffs by mouth every 4-6 hours as needed for wheezing   amLODipine (NORVASC) 5 MG tablet Take 5 mg by mouth daily.   atorvastatin (LIPITOR) 80 MG tablet Take 1 tablet (80 mg total) by mouth daily at 6 PM.   azelastine (ASTELIN) 0.1 % nasal spray  Place 1 spray into both nostrils 2 (two) times daily. Use in each nostril as directed   clopidogrel (PLAVIX) 75 MG tablet Take 1 tablet (75 mg total) by mouth daily.   dapagliflozin propanediol (FARXIGA) 10 MG TABS tablet Take 1 tablet (10 mg total) by  mouth daily before breakfast.   DULoxetine (CYMBALTA) 30 MG capsule Take 30 mg by mouth daily.   famotidine (PEPCID) 20 MG tablet One after supper   furosemide (LASIX) 40 MG tablet Take 1 tablet (40 mg total) by mouth daily.   guaiFENesin (MUCINEX) 600 MG 12 hr tablet Take 1 tablet (600 mg total) by mouth 2 (two) times daily as needed. (Patient taking differently: Take 600 mg by mouth 2 (two) times daily as needed for cough.)   isosorbide mononitrate (IMDUR) 30 MG 24 hr tablet TAKE 1 TABLET BY MOUTH DAILY   montelukast (SINGULAIR) 10 MG tablet Take 1 tablet (10 mg total) by mouth at bedtime.   ondansetron (ZOFRAN) 4 MG tablet Take 1 tablet (4 mg total) by mouth every 8 (eight) hours as needed for nausea or vomiting.   pantoprazole (PROTONIX) 40 MG tablet Take 30-60 min before first meal of the day   phenazopyridine (PYRIDIUM) 200 MG tablet Take 1 tablet (200 mg total) by mouth 3 (three) times daily.   potassium chloride (KLOR-CON) 10 MEQ tablet Take 1 tablet (10 mEq total) by mouth 2 (two) times daily.   [DISCONTINUED] JARDIANCE 10 MG TABS tablet Take 10 mg by mouth daily.     Allergies:   Ciprofloxacin, Codeine, Doxycycline, Percocet [oxycodone-acetaminophen], and Tramadol   Social History   Socioeconomic History   Marital status: Married    Spouse name: Tommy    Number of children: 2   Years of education: 12+   Highest education level: Not on file  Occupational History   Occupation: retired  Tobacco Use   Smoking status: Never   Smokeless tobacco: Never  Vaping Use   Vaping Use: Never used  Substance and Sexual Activity   Alcohol use: No    Alcohol/week: 0.0 standard drinks of alcohol   Drug use: No   Sexual activity: Not  Currently  Other Topics Concern   Not on file  Social History Narrative   Right handed, caffeine 2 cups daily, married, 4 kids, Retired.  [employer] Grad.    Social Determinants of Health   Financial Resource Strain: Not on file  Food Insecurity: No Food Insecurity (10/02/2022)   Hunger Vital Sign    Worried About Running Out of Food in the Last Year: Never true    Ran Out of Food in the Last Year: Never true  Transportation Needs: No Transportation Needs (10/02/2022)   PRAPARE - Administrator, Civil Service (Medical): No    Lack of Transportation (Non-Medical): No  Physical Activity: Not on file  Stress: Not on file  Social Connections: Not on file     Family History: The patient's family history includes Asthma in her mother; Colon cancer (age of onset: 63) in her sister; Heart attack in her father, mother, sister, sister, and sister; Heart disease in her father and mother; Hypertension in her mother, sister, and sister; Lung disease in her daughter; Thyroid disease in her daughter. ROS:   Please see the history of present illness.     All other systems reviewed and are negative.  EKGs/Labs/Other Studies Reviewed:    The following studies were reviewed today:   EKG:         Recent Labs: 10/01/2022: B Natriuretic Peptide 104.8 10/03/2022: ALT 22; BUN 14; Creatinine, Ser 0.87; Hemoglobin 12.0; Magnesium 2.1; Platelets 172; Potassium 3.9; Sodium 136; TSH 2.080  Recent Lipid Panel    Component Value Date/Time   CHOL 109 07/12/2019 0925   TRIG  148 07/12/2019 0925   HDL 46 07/12/2019 0925   CHOLHDL 2.4 07/12/2019 0925   CHOLHDL 6.1 04/28/2015 0645   VLDL 60 (H) 04/28/2015 0645   LDLCALC 38 07/12/2019 0925    Physical Exam:   Physical Exam: Blood pressure (!) 140/72, pulse 66, height 5\' 5"  (1.651 m), weight 144 lb (65.3 kg), SpO2 96 %.  HYPERTENSION CONTROL Vitals:   03/01/23 1318 03/01/23 1345  BP: (!) 144/74 (!) 140/72    The patient's blood pressure is  elevated above target today.  In order to address the patient's elevated BP: A new medication was prescribed today.       GEN:  Well nourished, well developed in no acute distress HEENT: Normal NECK: No JVD; No carotid bruits LYMPHATICS: No lymphadenopathy CARDIAC: RRR , soft systolic murmur  RESPIRATORY: rales in both bases ,  seemed to partially clear with repeated deep breaths. ABDOMEN: Soft, non-tender, non-distended MUSCULOSKELETAL:  No edema; No deformity  SKIN: Warm and dry NEUROLOGIC:  Alert and oriented x 3    ASSESSMENT:    1. Shortness of breath   2. Wheezing   3. Medication management        PLAN:      1. Acute on chonic diastolic CHF: Eriyanna complaint continues to complain of shortness of breath especially with exertion.  She does have diastolic dysfunction.  She has got hyperdynamic LV systolic function with an EF of 70 to 75%.  Will start by restarting Lasix and potassium.  Will start her on Lasix 40 mg a day with potassium chloride 10 mill equivalents twice a day.  If she feels like she is becoming volume depleted will reduce this to every other day or perhaps as needed.  Will also start her on Farxiga 10 mg a day which should help her diastolic dysfunction.    2. Hypertension:      Blood pressure is well-controlled  3. Hyperlipidemia:     4.  DM:    Her diabetes is not all that well-controlled.  Continue metformin.  Will start her on Farxiga 10 mg a day.    In order of problems listed above: Medication Adjustments/Labs and Tests Ordered: Current medicines are reviewed at length with the patient today.  Concerns regarding medicines are outlined above.  Orders Placed This Encounter  Procedures   Basic metabolic panel    Meds ordered this encounter  Medications   dapagliflozin propanediol (FARXIGA) 10 MG TABS tablet    Sig: Take 1 tablet (10 mg total) by mouth daily before breakfast.    Dispense:  90 tablet    Refill:  3   furosemide  (LASIX) 40 MG tablet    Sig: Take 1 tablet (40 mg total) by mouth daily.    Dispense:  90 tablet    Refill:  3   potassium chloride (KLOR-CON) 10 MEQ tablet    Sig: Take 1 tablet (10 mEq total) by mouth 2 (two) times daily.    Dispense:  180 tablet    Refill:  3   albuterol (VENTOLIN HFA) 108 (90 Base) MCG/ACT inhaler    Sig: Use 2 puffs by mouth every 4-6 hours as needed for wheezing    Dispense:  18 g    Refill:  3       Signed, Kristeen Miss, MD  03/01/2023 6:21 PM    [facility]

## 2023-03-01 NOTE — Patient Instructions (Signed)
Medication Instructions:  START Furosemide 40mg  daily START Potassium Chloride twice daily START Farxiga 10mg  daily Use Albuterol inhaler 2 puffs every 4-6 hours as needed *If you need a refill on your cardiac medications before your next appointment, please call your pharmacy*   Lab Work: BMET in 2-3 weeks If you have labs (blood work) drawn today and your tests are completely normal, you will receive your results only by: MyChart Message (if you have MyChart) OR A paper copy in the mail If you have any lab test that is abnormal or we need to change your treatment, we will call you to review the results.   Testing/Procedures: NONE   Follow-Up: At [facility], you and your health needs are our priority.  As part of our continuing mission to provide you with exceptional heart care, we have created designated Provider Care Teams.  These Care Teams include your primary Cardiologist (physician) and Advanced Practice Providers (APPs -  Physician Assistants and Nurse Practitioners) who all work together to provide you with the care you need, when you need it.  Your next appointment:   3 month(s)  Provider:   Kristeen Miss, MD

## 2023-03-03 DIAGNOSIS — R8290 Unspecified abnormal findings in urine: Secondary | ICD-10-CM | POA: Diagnosis not present

## 2023-03-03 DIAGNOSIS — M545 Low back pain, unspecified: Secondary | ICD-10-CM | POA: Diagnosis not present

## 2023-03-07 ENCOUNTER — Ambulatory Visit: Payer: HMO | Admitting: Internal Medicine

## 2023-03-17 ENCOUNTER — Ambulatory Visit (HOSPITAL_COMMUNITY)
Admission: RE | Admit: 2023-03-17 | Discharge: 2023-03-17 | Disposition: A | Discharge status: Home / Self Care | Payer: HMO | Source: Ambulatory Visit | Attending: Internal Medicine | Admitting: Internal Medicine

## 2023-03-17 ENCOUNTER — Encounter: Payer: Self-pay | Admitting: Internal Medicine

## 2023-03-17 ENCOUNTER — Ambulatory Visit (INDEPENDENT_AMBULATORY_CARE_PROVIDER_SITE_OTHER): Payer: HMO | Admitting: Internal Medicine

## 2023-03-17 VITALS — BP 109/62 | HR 64 | Ht 65.0 in | Wt 143.4 lb

## 2023-03-17 DIAGNOSIS — R058 Other specified cough: Secondary | ICD-10-CM

## 2023-03-17 DIAGNOSIS — Z9011 Acquired absence of right breast and nipple: Secondary | ICD-10-CM | POA: Diagnosis not present

## 2023-03-17 DIAGNOSIS — R059 Cough, unspecified: Secondary | ICD-10-CM | POA: Diagnosis not present

## 2023-03-17 DIAGNOSIS — R0609 Other forms of dyspnea: Secondary | ICD-10-CM | POA: Diagnosis not present

## 2023-03-17 MED ORDER — PREDNISONE 10 MG PO TABS
ORAL_TABLET | ORAL | 0 refills | Status: DC
Start: 2023-03-17 — End: 2023-07-26

## 2023-03-17 NOTE — Assessment & Plan Note (Signed)
Onset ? 2009 but worse since xmas 2023 while on trelegy > d/c 11/15/2022  - Sinus CT 10/02/22  [facility]  - much improved 12/23/2022 but still throat clearing on ppi but not ac and no H2 - 03/17/2023 better on h1 ahd H2 but taking H1 one in am and thinks it helps but  makes her sleepy > try taking h1 in pms/ pred x 6 days  - Allergy screen 03/17/2023 >  Eos 0. /  IgE    Of the three most common causes of  Sub-acute / recurrent or chronic cough, only one (GERD)  can actually contribute to/ trigger  the other two (asthma and post nasal drip syndrome)  and perpetuate the cylce of cough.  While not intuitively obvious, many patients with chronic low grade reflux do not cough until there is a primary insult that disturbs the protective epithelial barrier and exposes sensitive nerve endings.   This is typically viral but can due to PNDS and  either may apply here.   >>> The point is that once this occurs, it is difficult to eliminate the cycle  using anything but a maximally effective acid suppression regimen at least in the short run, accompanied by an appropriate diet to address non acid GERD and control / eliminate the cough itself for at least 3 days starting with delsym then considering gabapentin trial and >>> also added 6 day taper off  Prednisone starting at 40 mg per day in case of component of Th-2 driven upper or lower airways inflammation (if cough responds short term only to relapse before return while will on full rx for uacs (as above), then  that would point to allergic rhinitis/ asthma or eos bronchitis as alternative dx)

## 2023-03-17 NOTE — Patient Instructions (Addendum)
Change chlorpheniramine 4 mg to where you take it after supper and / or 1 hour before bedtime as needed for throat clearing    Stop mucinex and start over the counter Delsym 2 tsp every 12 hours as needed   Prednisone 10 mg take  4 each am x 2 days,   2 each am x 2 days,  1 each am x 2 days and stop   Please remember to go to the lab department   for your tests - we will call you with the results when they are available.  Please remember to go to the  x-ray department  @  [facility] for your tests - we will call you with the results when they are available         Please schedule a follow up office visit in 6 weeks, call sooner if needed with all medications /inhalers/ solutions in hand so we can verify exactly what you are taking. This includes all medications from all doctors and over the counters

## 2023-03-17 NOTE — Progress Notes (Signed)
[name], female    DOB: September 04, 1941   MRN: [id number]   Brief patient profile:   55  yowm  never smoker  self- referred to pulmonary clinic 11/15/2022  for "bronchitis"  Had asthma as child and does have h/o seasonal rhinitis going back to childhood   but no daily asthma symptoms (just intermittent 3-4x per year need for saba) until aorund 2009 eval by [name] and dx as ashthma "better on symbicort / singulair" referred back to pulmonary 11/18/2013 by [name].   Recs on 11/18/13   For now rx for gerd, pnds and return to regroup with full med reconciliation/ cxr/  Next step is add neurontin/ complete a MCT to be sure re dx of asthma or not.  >>> did not return until 11/15/2022     History of Present Illness  11/15/2022  Pulmonary/ 1st office eval/[name] downhill since Christmas 2023 / maint on Trelegy  Chief Complaint  Patient presents with   Follow-up    Pt of Dr [name]- c/o cough for "years". She states she has mucus in her throat "all the time".  Cough tends to be worse at night and wakes her up. She occ prod some minimal yellow sputum. She rarely uses her albuterol.   Dyspnea: baseline  food lion maybe 2 aisles > cardiac eval Feb 2024 neg  Cough: worse at hs immediate and up a lot each night  to take medications for cough > min yellow mucus assoc with sense of excess pnds Sleep: bed is flat with a couple of pillows SABA use: duoneb once a day seems to help more than anything else Prednisone doesn't really help the chronic cough Rec Stop all inhalers and just use the duoneb up to 4 x daily  Pantoprazole 40 mg  Take 30-60 min before first meal of the day  and add pepcid 20  GERD diet reviewed, bed blocks rec  For drainage / throat tickle stop CLARITON  try take CHLORPHENIRAMINE  4 mg    Return  with all medications /inhalers/ solutions in hand       12/23/2022  f/u ov/[place] office/[name] re: intermittent asthma/cough maint on ppi not ac and did not bring all meds   Chief Complaint   Patient presents with   Follow-up    Pt f/u states that he cough is better, does have some constant mucus in her throat that is yellow colored  Dyspnea:  food lion better, no longer on trelegy or nebs (make her worse)  Cough: sensation of globus, min yellow p lots of throat clearing  Sleeping: flat bed / 2 pillows / wedge / no noct resp cc  SABA use: neb chokes her  02: none  Rec Continue Pantoprazole (protonix) 40 mg  but   Take  30-60 min before first meal of the day and add Pepcid (famotidine)  20 mg after supper until return to office - For drainage / throat tickle try take CHLORPHENIRAMINE  4 mg    If not improving your throat clearing you will need to see a throat specialist from [facility] either in [place] or [place] (prefer [place])    Please schedule a follow up office visit in 8 weeks, call sooner if needed with all medications /inhalers/ solutions in hand   03/01/23 seen by [name] wheezing with crackes and leg swelling with dx diastolic dysfunction rx lasi which she says fixed the leg swelling by not the breathing    03/17/2023  f/u ov/[place] office/[name]  re: UACS  maint on h1 just one  in am  did not  bring meds as rec Chief Complaint  Patient presents with   Follow-up  Dyspnea limited by  "feels tired and legs swell and hurt" more than sob while walking up to 10 min flat grad slow pace Cough: p stirring in am worse p coffee / min mucoid  Sleeping: wedge no cough or sob  SABA use: none  02: none     No obvious day to day or daytime variability or assoc excess/ purulent sputum or mucus plugs or hemoptysis or cp or chest tightness, subjective wheeze or overt   hb symptoms.   Sleeping  without nocturnal  or early am exacerbation  of respiratory  c/o's or need for noct saba. Also denies any obvious fluctuation of symptoms with weather or environmental changes or other aggravating or alleviating factors except as outlined above   No unusual exposure hx or h/o childhood pna/  asthma or knowledge of premature birth.  Current Allergies, Complete Past Medical History, Past Surgical History, Family History, and Social History were reviewed in [name].  ROS  The following are not active complaints unless bolded Hoarseness, sore throat, dysphagia, dental problems, itching, sneezing,  nasal congestion or discharge of excess mucus or purulent secretions, ear ache,   fever, chills, sweats, unintended wt loss or wt gain, classically pleuritic or exertional cp,  orthopnea pnd or arm/hand swelling  or leg swelling resolved p diuretic , presyncope, palpitations, abdominal pain, anorexia, nausea, vomiting, diarrhea  or change in bowel habits or change in bladder habits, change in stools or change in urine, dysuria, hematuria,  rash, arthralgias, visual complaints, headache, numbness, weakness or ataxia or problems with walking or coordination,  change in mood or  memory.        Current Meds  Medication Sig   acetaminophen (TYLENOL) 500 MG tablet Take 500 mg by mouth every 6 (six) hours as needed. As needed for pain   albuterol (VENTOLIN HFA) 108 (90 Base) MCG/ACT inhaler Use 2 puffs by mouth every 4-6 hours as needed for wheezing   amLODipine (NORVASC) 5 MG tablet Take 5 mg by mouth daily.   atorvastatin (LIPITOR) 80 MG tablet Take 1 tablet (80 mg total) by mouth daily at 6 PM.   azelastine (ASTELIN) 0.1 % nasal spray Place 1 spray into both nostrils 2 (two) times daily. Use in each nostril as directed   clopidogrel (PLAVIX) 75 MG tablet Take 1 tablet (75 mg total) by mouth daily.   dapagliflozin propanediol (FARXIGA) 10 MG TABS tablet Take 1 tablet (10 mg total) by mouth daily before breakfast.   DULoxetine (CYMBALTA) 30 MG capsule Take 30 mg by mouth daily.   famotidine (PEPCID) 20 MG tablet One after supper   furosemide (LASIX) 40 MG tablet Take 1 tablet (40 mg total) by mouth daily.   guaiFENesin (MUCINEX) 600 MG 12 hr tablet Take 1 tablet (600  mg total) by mouth 2 (two) times daily as needed. (Patient taking differently: Take 600 mg by mouth 2 (two) times daily as needed for cough.)   isosorbide mononitrate (IMDUR) 30 MG 24 hr tablet TAKE 1 TABLET BY MOUTH DAILY   montelukast (SINGULAIR) 10 MG tablet Take 1 tablet (10 mg total) by mouth at bedtime.   ondansetron (ZOFRAN) 4 MG tablet Take 1 tablet (4 mg total) by mouth every 8 (eight) hours as needed for nausea or vomiting.   pantoprazole (PROTONIX) 40 MG  tablet Take 30-60 min before first meal of the day   phenazopyridine (PYRIDIUM) 200 MG tablet Take 1 tablet (200 mg total) by mouth 3 (three) times daily.   potassium chloride (KLOR-CON) 10 MEQ tablet Take 1 tablet (10 mEq total) by mouth 2 (two) times daily.   predniSONE (DELTASONE) 10 MG tablet Take  4 each am x 2 days,   2 each am x 2 days,  1 each am x 2 days and stop         Past Medical History:  Diagnosis Date   Aortic atherosclerosis (HCC)    Asthma    Breast CA (HCC)    Cancer (HCC)    right breast mastectomy   CVA (cerebral vascular accident) (HCC) 04/27/2015   DDD (degenerative disc disease), cervical    Duodenal ulcer    External hemorrhoids    Generalized anxiety disorder    H/O chest pain 03/24/2006   normal nuclear  study   Headache    High cholesterol    Hypertension    Mild aortic insufficiency 03/28/2006   echo   Normal cardiac stress test 2005   Pancreatitis    Personal history of chemotherapy    Personal history of radiation therapy    PONV (postoperative nausea and vomiting)    Sinus bradycardia on ECG    Stroke (HCC)         Objective:    Wts   03/17/2023        143  12/23/22 142 lb 12.8 oz (64.8 kg)  11/15/22 146 lb (66.2 kg)  10/11/22 151 lb (68.5 kg)      Vital signs reviewed  03/17/2023  - Note at rest 02 sats  96% on RA   General appearance:    pleasant amb wf nad / near constant throat clearing (did not take H1 am of ov)   HEENT : Oropharynx  min erythema/ no pnd/cobblestoing      Nasal turbinates clear    NECK :  without  apparent JVD/ palpable Nodes/TM    LUNGS: no acc muscle use,  Nl contour chest which is clear to A and P bilaterally without cough on insp or exp maneuvers   CV:  RRR  no s3 or murmur or increase in P2, and no edema   ABD:  soft and nontender with nl inspiratory excursion in the supine position. No bruits or organomegaly appreciated   MS:  Nl gait/ ext warm without deformities Or obvious joint restrictions  calf tenderness, cyanosis or clubbing    SKIN: warm and dry without lesions    NEURO:  alert, approp, nl sensorium with  no motor or cerebellar deficits apparent.      I personally reviewed images and agree with radiology impression as follows:  CXR:   pa and lateral 10/20/22 1. Chronic interstitial lung disease suspected with no superimposed acute cardiopulmonary abnormality. 2. Aortic Atherosclerosis (ICD10-I70.0) My review: c/w recovering phase of viral pna which was the clinical dx      CXR PA and Lateral:   03/17/2023 :    I personally reviewed images and impression is as follows:     No acute findings      Assessment

## 2023-03-17 NOTE — Assessment & Plan Note (Signed)
S/p RSV pna 09/2022  - 03/01/23 rx lasix by [name] for diastolic dysfunction  - 03/17/2023   Walked on RA  x  3  lap(s) =  approx 450  ft  @ mod fast pace, stopped due to end of study  with lowest 02 sats 94% and min sob / lots of throat clearing    No evidene to support asthma or ILD here, suspect the "wheeze" was pseudoasthma or cardaic asthma/ resolved and no further w/u needed for now  Rec f/u 6 weeks with all meds in hand using a trust but verify approach to confirm accurate Medication  Reconciliation The principal here is that until we are certain that the  patients are doing what we've asked, it makes no sense to ask them to do more.   Each maintenance medication was reviewed in detail including emphasizing most importantly the difference between maintenance and prns and under what circumstances the prns are to be triggered using an action plan format where appropriate.  Total time for H and P, chart review, counseling,  directly observing portions of ambulatory 02 saturation study/ and generating customized AVS unique to this office visit / same day charting  >40 min for multiple  refractory chronic respiratory  symptoms of uncertain etiology

## 2023-03-20 LAB — IGE: IgE (Immunoglobulin E), Serum: 8 IU/mL (ref 6–495)

## 2023-03-20 LAB — CBC WITH DIFFERENTIAL/PLATELET
Basophils Absolute: 0.1 10*3/uL (ref 0.0–0.2)
Basos: 1 %
EOS (ABSOLUTE): 0.2 10*3/uL (ref 0.0–0.4)
Eos: 3 %
Hematocrit: 41.7 % (ref 34.0–46.6)
Hemoglobin: 13.5 g/dL (ref 11.1–15.9)
Immature Grans (Abs): 0 10*3/uL (ref 0.0–0.1)
Immature Granulocytes: 0 %
Lymphocytes Absolute: 2 10*3/uL (ref 0.7–3.1)
Lymphs: 24 %
MCH: 29.3 pg (ref 26.6–33.0)
MCHC: 32.4 g/dL (ref 31.5–35.7)
MCV: 91 fL (ref 79–97)
Monocytes Absolute: 0.5 10*3/uL (ref 0.1–0.9)
Monocytes: 7 %
Neutrophils Absolute: 5.4 10*3/uL (ref 1.4–7.0)
Neutrophils: 65 %
Platelets: 268 10*3/uL (ref 150–450)
RBC: 4.61 x10E6/uL (ref 3.77–5.28)
RDW: 12.5 % (ref 11.7–15.4)
WBC: 8.2 10*3/uL (ref 3.4–10.8)

## 2023-03-20 LAB — SEDIMENTATION RATE: Sed Rate: 8 mm/hr (ref 0–40)

## 2023-03-22 ENCOUNTER — Other Ambulatory Visit: Payer: HMO

## 2023-03-22 DIAGNOSIS — M1812 Unilateral primary osteoarthritis of first carpometacarpal joint, left hand: Secondary | ICD-10-CM | POA: Diagnosis not present

## 2023-03-24 ENCOUNTER — Ambulatory Visit: Payer: HMO

## 2023-03-27 ENCOUNTER — Ambulatory Visit: Payer: HMO | Attending: Cardiovascular Disease

## 2023-04-17 ENCOUNTER — Ambulatory Visit: Payer: HMO | Admitting: Cardiovascular Disease

## 2023-04-17 DIAGNOSIS — M170 Bilateral primary osteoarthritis of knee: Secondary | ICD-10-CM | POA: Diagnosis not present

## 2023-04-19 DIAGNOSIS — H6122 Impacted cerumen, left ear: Secondary | ICD-10-CM | POA: Diagnosis not present

## 2023-04-19 DIAGNOSIS — K219 Gastro-esophageal reflux disease without esophagitis: Secondary | ICD-10-CM | POA: Diagnosis not present

## 2023-04-19 DIAGNOSIS — R09A2 Foreign body sensation, throat: Secondary | ICD-10-CM | POA: Diagnosis not present

## 2023-04-19 DIAGNOSIS — R053 Chronic cough: Secondary | ICD-10-CM | POA: Diagnosis not present

## 2023-04-21 ENCOUNTER — Ambulatory Visit: Payer: HMO | Attending: Family Medicine

## 2023-04-25 ENCOUNTER — Ambulatory Visit
Admission: EM | Admit: 2023-04-25 | Discharge: 2023-04-25 | Disposition: A | Discharge status: Home / Self Care | Payer: HMO | Attending: Nurse Practitioner | Admitting: Nurse Practitioner

## 2023-04-25 DIAGNOSIS — J069 Acute upper respiratory infection, unspecified: Secondary | ICD-10-CM | POA: Diagnosis not present

## 2023-04-25 DIAGNOSIS — Z1152 Encounter for screening for COVID-19: Secondary | ICD-10-CM | POA: Insufficient documentation

## 2023-04-25 LAB — POCT INFLUENZA A/B
Influenza A, POC: NEGATIVE
Influenza B, POC: NEGATIVE

## 2023-04-25 MED ORDER — FLUTICASONE PROPIONATE 50 MCG/ACT NA SUSP: 2.0000 | Freq: Every day | NASAL | 0 refills | Status: DC

## 2023-04-25 MED ORDER — GUAIFENESIN 100 MG/5ML PO LIQD: 10.0000 mL | Freq: Three times a day (TID) | ORAL | 0 refills | Status: AC | PRN

## 2023-04-25 NOTE — Discharge Instructions (Addendum)
Your influenza test was negative.  A COVID test is pending.  You will be contacted if the pending test result is positive.  As discussed, if you choose to use an antiviral treatment, you will need to start the medication on 04/26/2023. Take medication as prescribed.  Recommend that you stop the DayQuil as it appears to be elevating her blood pressure.  You can also try Coricidin HBP for your cough. May take Tylenol as needed for pain, fever, or general discomfort. Increase fluids and allow for plenty of rest. Recommend using normal saline nasal spray throughout the day for nasal congestion and runny nose. Recommend using a humidifier in your bedroom at nighttime during sleep and sleeping elevated on pillows while cough symptoms persist. As discussed, if your pending test results are negative, and you continue to experience symptoms with worsening, please follow-up in this clinic immediately or with your primary care physician for further evaluation. Follow-up as needed.

## 2023-04-25 NOTE — ED Triage Notes (Signed)
Pt c/o cough, head congestion, headache, body aches x 4 days. Coughing up the Mucus in throat, headache causing pain in the eyes, generalized body ache,

## 2023-04-25 NOTE — ED Provider Notes (Signed)
[facility]    CSN: [id number] Arrival date & time: 04/25/23  0855      History   Chief Complaint No chief complaint on file.   HPI [name] is a 82 y.o. female.   The history is provided by the patient.   The patient presents with a 4-day history of fatigue, headache, head congestion, body aches, wheezing and cough.  Patient denies fever, chills, ear pain, ear drainage, shortness of breath, difficulty breathing, chest pain, abdominal pain, nausea, vomiting, or diarrhea.  Patient reports she has been taking over-the-counter DayQuil and using her daily allergy medication.  Patient reports she has been using her albuterol inhaler more frequently since her symptoms started.  Patient denies any obvious known sick contacts.  Past Medical History:  Diagnosis Date   Aortic atherosclerosis (HCC)    Asthma    Breast CA (HCC)    Cancer (HCC)    right breast mastectomy   CVA (cerebral vascular accident) (HCC) 04/27/2015   DDD (degenerative disc disease), cervical    Duodenal ulcer    External hemorrhoids    Generalized anxiety disorder    H/O chest pain 03/24/2006   normal nuclear  study   Headache    High cholesterol    Hypertension    Mild aortic insufficiency 03/28/2006   echo   Normal cardiac stress test 2005   Pancreatitis    Personal history of chemotherapy    Personal history of radiation therapy    PONV (postoperative nausea and vomiting)    Sinus bradycardia on ECG    Stroke [facility])     Patient Active Problem List   Diagnosis Date Noted   Acute respiratory failure with hypoxia (HCC) 10/11/2022   RSV (respiratory syncytial virus pneumonia) 10/03/2022   Fracture of proximal phalanx of lesser toe of right foot 10/02/2022   Chronic diastolic CHF (congestive heart failure) (HCC) 10/02/2022   Lactic acidosis 10/02/2022   Mixed hyperlipidemia due to type 2 diabetes mellitus (HCC) 10/02/2022   Antibiotic reaction, initial encounter 10/02/2022   Abnormal  CT of the chest 10/02/2022   Dehydration with hyponatremia 10/01/2022   Type 2 diabetes mellitus with hyperglycemia (HCC) 10/01/2022   Bronchitis 05/25/2021   Asthma exacerbation 05/25/2021   Angina pectoris (HCC)    History of stroke 06/13/2017   DOE (dyspnea on exertion) 05/24/2017   Occipital neuralgia of right side 11/02/2016   Palpitations 06/17/2015   Cerebrovascular accident (CVA) due to occlusion of left middle cerebral artery (HCC) 06/17/2015   Hyperlipidemia 06/17/2015   HLD (hyperlipidemia)    Intracranial vascular stenosis    Cerebral infarction due to occlusion of left middle cerebral artery (HCC)    Occipital headache    Extrinsic asthma 07/02/2014   Migraine variant with status migrainosus 07/02/2014   Benign paroxysmal positional vertigo 07/02/2014   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/2014   Essential hypertension 03/03/2014   Mild aortic insufficiency    H/O chest pain    Sinus bradycardia on ECG    GERD without esophagitis 03/24/2008   ADENOCARCINOMA, BREAST, RIGHT 06/30/2007   ASTHMA 06/30/2007   Upper airway cough syndrome 06/30/2007    Past Surgical History:  Procedure Laterality Date   ABDOMINAL HYSTERECTOMY     APPENDECTOMY     "30 years ago"   childbirth     x 2   CHOLECYSTECTOMY     "15 years ago"   ESOPHAGOGASTRODUODENOSCOPY N/A 03/04/2014   Procedure: ESOPHAGOGASTRODUODENOSCOPY (EGD);  Surgeon: Theda Belfast, MD;  Location: [facility] END[facility]COPY;  Service: Endoscopy;  Laterality: N/A;   MASTECTOMY  1993   right breast   RIGHT/LEFT HEART CATH AND CORONARY ANGIOGRAPHY N/A 05/30/2019   Procedure: RIGHT/LEFT HEART CATH AND CORONARY ANGIOGRAPHY;  Surgeon: Corky Crafts, MD;  Location: [facility] INVASIVE CV LAB;  Service: Cardiovascular;  Laterality: N/A;   TUBAL LIGATION      OB History     Gravida  2   Para  2   Term      Preterm      AB      Living  2      SAB      IAB      Ectopic      Multiple      Live Births                Home Medications    Prior to Admission medications   Medication Sig Start Date End Date Taking? Authorizing Provider  fluticasone (FLONASE) 50 MCG/ACT nasal spray Place 2 sprays into both nostrils daily. 04/25/23  Yes -Warren, Sadie Haber, NP  guaiFENesin (ROBITUSSIN) 100 MG/5ML liquid Take 10 mLs by mouth every 8 (eight) hours as needed for up to 10 days for cough or to loosen phlegm. 04/25/23 05/05/23 Yes -Warren, Sadie Haber, NP  acetaminophen (TYLENOL) 500 MG tablet Take 500 mg by mouth every 6 (six) hours as needed. As needed for pain    [provider]  albuterol (VENTOLIN HFA) 108 (90 Base) MCG/ACT inhaler Use 2 puffs by mouth every 4-6 hours as needed for wheezing 03/01/23   [name], Deloris Ping, MD  amLODipine (NORVASC) 5 MG tablet Take 5 mg by mouth daily.    [provider]  atorvastatin (LIPITOR) 80 MG tablet Take 1 tablet (80 mg total) by mouth daily at 6 PM. 07/06/15   Marvel Plan, MD  azelastine (ASTELIN) 0.1 % nasal spray Place 1 spray into both nostrils 2 (two) times daily. Use in each nostril as directed 09/15/22   Particia Nearing, PA-C  clopidogrel (PLAVIX) 75 MG tablet Take 1 tablet (75 mg total) by mouth daily. 06/01/17   Marvel Plan, MD  dapagliflozin propanediol (FARXIGA) 10 MG TABS tablet Take 1 tablet (10 mg total) by mouth daily before breakfast. 03/01/23   [name], Deloris Ping, MD  DULoxetine (CYMBALTA) 30 MG capsule Take 30 mg by mouth daily. 05/05/21   [provider]  famotidine (PEPCID) 20 MG tablet One after supper 12/23/22   Nyoka Cowden, MD  furosemide (LASIX) 40 MG tablet Take 1 tablet (40 mg total) by mouth daily. 03/01/23   [name], Deloris Ping, MD  isosorbide mononitrate (IMDUR) 30 MG 24 hr tablet TAKE 1 TABLET BY MOUTH DAILY 07/07/22   [name], Deloris Ping, MD  metFORMIN (GLUCOPHAGE) 500 MG tablet Take 1 tablet (500 mg total) by mouth daily with breakfast. 10/05/22 12/23/22  Lanae Boast, MD  montelukast (SINGULAIR) 10 MG tablet Take 1  tablet (10 mg total) by mouth at bedtime. 05/29/21   Leroy Sea, MD  ondansetron (ZOFRAN) 4 MG tablet Take 1 tablet (4 mg total) by mouth every 8 (eight) hours as needed for nausea or vomiting. 02/01/22   [name], MD  pantoprazole (PROTONIX) 40 MG tablet Take 30-60 min before first meal of the day 12/23/22   Nyoka Cowden, MD  phenazopyridine (PYRIDIUM) 200 MG tablet Take 1 tablet (200 mg total) by mouth 3 (three) times daily. 01/22/23   Linwood Dibbles, MD  potassium chloride (KLOR-CON) 10 MEQ tablet Take 1 tablet (10 mEq total) by mouth 2 (two) times daily. 03/01/23   [name], Deloris Ping, MD  predniSONE (DELTASONE) 10 MG tablet Take  4 each am x 2 days,   2 each am x 2 days,  1 each am x 2 days and stop 03/17/23   Nyoka Cowden, MD    Family History Family History  Problem Relation Age of Onset   Heart attack Mother    Heart disease Mother    Hypertension Mother    Asthma Mother    Heart attack Father    Heart disease Father    Hypertension Sister    Heart attack Sister    Colon cancer Sister 61   Hypertension Sister    Heart attack Sister    Heart attack Sister    Lung disease Daughter    Thyroid disease Daughter     Social History Social History   Tobacco Use   Smoking status: Never   Smokeless tobacco: Never  Vaping Use   Vaping status: Never Used  Substance Use Topics   Alcohol use: No    Alcohol/week: 0.0 standard drinks of alcohol   Drug use: No     Allergies   Oxycodone-acetaminophen, Ciprofloxacin, Codeine, Doxycycline, Percocet [oxycodone-acetaminophen], and Tramadol   Review of Systems Review of Systems Per HPI  Physical Exam Triage Vital Signs ED Triage Vitals  Encounter Vitals Group     BP 04/25/23 0859 (!) 186/85     Systolic BP Percentile --      Diastolic BP Percentile --      Pulse Rate 04/25/23 0859 78     Resp 04/25/23 0859 12     Temp 04/25/23 0859 100 F (37.8 C)     Temp Source 04/25/23 0859 Oral     SpO2 04/25/23 0859 98 %      Weight --      Height --      Head Circumference --      Peak Flow --      Pain Score 04/25/23 0902 7     Pain Loc --      Pain Education --      Exclude from Growth Chart --    No data found.  Updated Vital Signs BP (!) 186/85 (BP Location: Left Arm)   Pulse 78   Temp 100 F (37.8 C) (Oral)   Resp 12   SpO2 98%   Visual Acuity Right Eye Distance:   Left Eye Distance:   Bilateral Distance:    Right Eye Near:   Left Eye Near:    Bilateral Near:     Physical Exam Vitals and nursing note reviewed.  Constitutional:      General: She is not in acute distress.    Appearance: Normal appearance.  HENT:     Head: Normocephalic.     Right Ear: Tympanic membrane, ear canal and external ear normal.     Left Ear: Tympanic membrane, ear canal and external ear normal.     Nose: Congestion present.     Mouth/Throat:     Lips: Pink.     Mouth: Mucous membranes are moist.     Pharynx: Uvula midline. Posterior oropharyngeal erythema and postnasal drip present. No pharyngeal swelling, oropharyngeal exudate or uvula swelling.  Eyes:     Extraocular Movements: Extraocular movements intact.     Conjunctiva/sclera: Conjunctivae normal.     Pupils: Pupils are equal, round, and reactive to  light.  Cardiovascular:     Rate and Rhythm: Normal rate and regular rhythm.     Pulses: Normal pulses.     Heart sounds: Normal heart sounds.  Pulmonary:     Effort: Pulmonary effort is normal. No respiratory distress.     Breath sounds: Normal breath sounds. No stridor. No wheezing, rhonchi or rales.  Abdominal:     General: Bowel sounds are normal.     Palpations: Abdomen is soft.     Tenderness: There is no abdominal tenderness.  Musculoskeletal:     Cervical back: Normal range of motion.  Lymphadenopathy:     Cervical: No cervical adenopathy.  Skin:    General: Skin is warm and dry.  Neurological:     General: No focal deficit present.     Mental Status: She is alert and oriented to  person, place, and time.  Psychiatric:        Mood and Affect: Mood normal.        Behavior: Behavior normal.      UC Treatments / Results  Labs (all labs ordered are listed, but only abnormal results are displayed) Labs Reviewed  SARS CORONAVIRUS 2 (TAT 6-24 HRS)  POCT INFLUENZA A/B    EKG   Radiology No results found.  Procedures Procedures (including critical care time)  Medications Ordered in UC Medications - No data to display  Initial Impression / Assessment and Plan / UC Course  I have reviewed the triage vital signs and the nursing notes.  Pertinent labs & imaging results that were available during my care of the patient were reviewed by me and considered in my medical decision making (see chart for details).  The patient is well-appearing, she is in no acute distress, she is hypertensive, but vital signs are otherwise stable.  Influenza test was negative, COVID test is pending.  Patient is able to receive Paxlovid if her COVID test is positive (creatinine 0.87 and GFR greater than 60 on 10/03/2022).  Patient will need to hold Lipitor for 2 weeks.  Symptoms appear to be of viral etiology.  Differential diagnoses include viral upper respiratory infection with cough, COVID, and acute bronchitis.  Will treat symptomatically with fluticasone 50 micro nasal spray for nasal congestion and postnasal drainage, and guaifenesin 100 mg for her cough.  Supportive care recommendations were provided and discussed with the patient to include increasing fluids, allowing for plenty of rest, over-the-counter Tylenol for pain or discomfort, and use of a humidifier in her bedroom at nighttime during sleep.  Patient was advised to follow-up if her symptoms worsen if her pending test results are negative.  Patient was in agreement with this plan of care and verbalizes understanding.  All questions were answered.  Patient stable for discharge.   Final Clinical Impressions(s) / UC Diagnoses    Final diagnoses:  Encounter for screening for COVID-19  Viral upper respiratory tract infection with cough     Discharge Instructions      Your influenza test was negative.  A COVID test is pending.  You will be contacted if the pending test result is positive.  As discussed, if you choose to use an antiviral treatment, you will need to start the medication on 04/26/2023. Take medication as prescribed.  Recommend that you stop the DayQuil as it appears to be elevating her blood pressure.  You can also try Coricidin HBP for your cough. May take Tylenol as needed for pain, fever, or general discomfort. Increase fluids and allow  for plenty of rest. Recommend using normal saline nasal spray throughout the day for nasal congestion and runny nose. Recommend using a humidifier in your bedroom at nighttime during sleep and sleeping elevated on pillows while cough symptoms persist. As discussed, if your pending test results are negative, and you continue to experience symptoms with worsening, please follow-up in this clinic immediately or with your primary care physician for further evaluation. Follow-up as needed.     ED Prescriptions     Medication Sig Dispense Auth. Provider   guaiFENesin (ROBITUSSIN) 100 MG/5ML liquid Take 10 mLs by mouth every 8 (eight) hours as needed for up to 10 days for cough or to loosen phlegm. 300 mL -Warren, Sadie Haber, NP   fluticasone (FLONASE) 50 MCG/ACT nasal spray Place 2 sprays into both nostrils daily. 16 g -Warren, Sadie Haber, NP      PDMP not reviewed this encounter.   Abran Cantor, NP 04/25/23 [phone]

## 2023-04-29 NOTE — Progress Notes (Deleted)
[name], female    DOB: 1941-08-22   MRN: [id number]   Brief patient profile:   25  yowm  never smoker  self- referred to pulmonary clinic 11/15/2022  for "bronchitis"  Had asthma as child and does have h/o seasonal rhinitis going back to childhood   but no daily asthma symptoms (just intermittent 3-4x per year need for saba) until aorund 2009 eval by [name] and dx as ashthma "better on symbicort / singulair" referred back to pulmonary 11/18/2013 by [name].   Recs on 11/18/13   For now rx for gerd, pnds and return to regroup with full med reconciliation/ cxr/  Next step is add neurontin/ complete a MCT to be sure re dx of asthma or not.  >>> did not return until 11/15/2022     History of Present Illness  11/15/2022  Pulmonary/ 1st office eval/[name] downhill since Christmas 2023 / maint on Trelegy  Chief Complaint  Patient presents with   Follow-up    Pt of Dr [name]- c/o cough for "years". She states she has mucus in her throat "all the time".  Cough tends to be worse at night and wakes her up. She occ prod some minimal yellow sputum. She rarely uses her albuterol.   Dyspnea: baseline  food lion maybe 2 aisles > cardiac eval Feb 2024 neg  Cough: worse at hs immediate and up a lot each night  to take medications for cough > min yellow mucus assoc with sense of excess pnds Sleep: bed is flat with a couple of pillows SABA use: duoneb once a day seems to help more than anything else Prednisone doesn't really help the chronic cough Rec Stop all inhalers and just use the duoneb up to 4 x daily  Pantoprazole 40 mg  Take 30-60 min before first meal of the day  and add pepcid 20  GERD diet reviewed, bed blocks rec  For drainage / throat tickle stop CLARITON  try take CHLORPHENIRAMINE  4 mg    Return  with all medications /inhalers/ solutions in hand       12/23/2022  f/u ov/[name]/[name] re: intermittent asthma/cough maint on ppi not ac and did not bring all meds   Chief Complaint   Patient presents with   Follow-up    Pt f/u states that he cough is better, does have some constant mucus in her throat that is yellow colored  Dyspnea:  food lion better, no longer on trelegy or nebs (make her worse)  Cough: sensation of globus, min yellow p lots of throat clearing  Sleeping: flat bed / 2 pillows / wedge / no noct resp cc  SABA use: neb chokes her  02: none  Rec Continue Pantoprazole (protonix) 40 mg  but   Take  30-60 min before first meal of the day and add Pepcid (famotidine)  20 mg after supper until return to office - For drainage / throat tickle try take CHLORPHENIRAMINE  4 mg    If not improving your throat clearing you will need to see a throat specialist from [facility] either in [place] or [place] (prefer [place])    Please schedule a follow up office visit in 8 weeks, call sooner if needed with all medications /inhalers/ solutions in hand   03/01/23 seen by [name] wheezing with crackes and leg swelling with dx diastolic dysfunction rx lasi which she says fixed the leg swelling by not the breathing    03/17/2023  f/u ov/[name]/[name]  re: UACS  maint on h1 just one  in am  did not  bring meds as rec Chief Complaint  Patient presents with   Follow-up  Dyspnea limited by  "feels tired and legs swell and hurt" more than sob while walking up to 10 min flat grad slow pace Cough: p stirring in am worse p coffee / min mucoid  Sleeping: wedge no cough or sob  SABA use: none  02: none  Rec Change chlorpheniramine 4 mg to where you take it after supper and / or 1 hour before bedtime as needed for throat clearing   Stop mucinex and start over the counter Delsym 2 tsp every 12 hours as needed  Prednisone 10 mg take  4 each am x 2 days,   2 each am x 2 days,  1 each am x 2 days and stop   Please schedule a follow up office visit in 6 weeks, call sooner if needed with all medications /inhalers/ solutions in hand    Allergy screen 03/17/2023 >  Eos 0.1 /  IgE   8   05/01/2023  f/u ov/[place] office/[name] re: UACS  maint on ***  did *** bring meds  No chief complaint on file.   Dyspnea:  *** Cough: *** Sleeping: *** SABA use: *** 02: *** Covid status: *** Lung cancer screening: ***   No obvious day to day or daytime variability or assoc excess/ purulent sputum or mucus plugs or hemoptysis or cp or chest tightness, subjective wheeze or overt sinus or hb symptoms.   *** without nocturnal  or early am exacerbation  of respiratory  c/o's or need for noct saba. Also denies any obvious fluctuation of symptoms with weather or environmental changes or other aggravating or alleviating factors except as outlined above   No unusual exposure hx or h/o childhood pna or knowledge of premature birth.  Current Allergies, Complete Past Medical History, Past Surgical History, Family History, and Social History were reviewed in [name].  ROS  The following are not active complaints unless bolded Hoarseness, sore throat, dysphagia, dental problems, itching, sneezing,  nasal congestion or discharge of excess mucus or purulent secretions, ear ache,   fever, chills, sweats, unintended wt loss or wt gain, classically pleuritic or exertional cp,  orthopnea pnd or arm/hand swelling  or leg swelling, presyncope, palpitations, abdominal pain, anorexia, nausea, vomiting, diarrhea  or change in bowel habits or change in bladder habits, change in stools or change in urine, dysuria, hematuria,  rash, arthralgias, visual complaints, headache, numbness, weakness or ataxia or problems with walking or coordination,  change in mood or  memory.        No outpatient medications have been marked as taking for the 05/01/23 encounter (Appointment) with Nyoka Cowden, MD.          Past Medical History:  Diagnosis Date   Aortic atherosclerosis (HCC)    Asthma    Breast CA (HCC)    Cancer (HCC)    right breast mastectomy   CVA (cerebral  vascular accident) (HCC) 04/27/2015   DDD (degenerative disc disease), cervical    Duodenal ulcer    External hemorrhoids    Generalized anxiety disorder    H/O chest pain 03/24/2006   normal nuclear  study   Headache    High cholesterol    Hypertension    Mild aortic insufficiency 03/28/2006   echo   Normal cardiac stress test 2005   Pancreatitis  Personal history of chemotherapy    Personal history of radiation therapy    PONV (postoperative nausea and vomiting)    Sinus bradycardia on ECG    Stroke [facility])         Objective:    Wts 05/01/2023       ***   03/17/2023        143  12/23/22 142 lb 12.8 oz (64.8 kg)  11/15/22 146 lb (66.2 kg)  10/11/22 151 lb (68.5 kg)      Vital signs reviewed  05/01/2023  - Note at rest 02 sats  ***% on ***   General appearance:    ***         CXR PA and Lateral:   03/17/2023 :    I personally reviewed images and impression is as follows:     No acute findings      Assessment

## 2023-05-01 ENCOUNTER — Encounter: Payer: Self-pay | Admitting: Internal Medicine

## 2023-05-01 ENCOUNTER — Ambulatory Visit: Payer: HMO | Admitting: Internal Medicine

## 2023-05-05 ENCOUNTER — Emergency Department (HOSPITAL_COMMUNITY)
Admission: EM | Admit: 2023-05-05 | Discharge: 2023-05-05 | Disposition: A | Discharge status: Home / Self Care | Payer: HMO | Attending: Emergency Medicine | Admitting: Emergency Medicine

## 2023-05-05 ENCOUNTER — Emergency Department (HOSPITAL_COMMUNITY): Payer: HMO

## 2023-05-05 ENCOUNTER — Encounter (HOSPITAL_COMMUNITY): Payer: Self-pay | Admitting: *Deleted

## 2023-05-05 ENCOUNTER — Other Ambulatory Visit: Payer: Self-pay

## 2023-05-05 DIAGNOSIS — S01511A Laceration without foreign body of lip, initial encounter: Secondary | ICD-10-CM | POA: Insufficient documentation

## 2023-05-05 DIAGNOSIS — M85842 Other specified disorders of bone density and structure, left hand: Secondary | ICD-10-CM | POA: Diagnosis not present

## 2023-05-05 DIAGNOSIS — W19XXXA Unspecified fall, initial encounter: Secondary | ICD-10-CM

## 2023-05-05 DIAGNOSIS — S63642A Sprain of metacarpophalangeal joint of left thumb, initial encounter: Secondary | ICD-10-CM | POA: Diagnosis not present

## 2023-05-05 DIAGNOSIS — Z7902 Long term (current) use of antithrombotics/antiplatelets: Secondary | ICD-10-CM | POA: Insufficient documentation

## 2023-05-05 DIAGNOSIS — R9082 White matter disease, unspecified: Secondary | ICD-10-CM | POA: Insufficient documentation

## 2023-05-05 DIAGNOSIS — Z23 Encounter for immunization: Secondary | ICD-10-CM | POA: Diagnosis not present

## 2023-05-05 DIAGNOSIS — S6992XA Unspecified injury of left wrist, hand and finger(s), initial encounter: Secondary | ICD-10-CM | POA: Diagnosis not present

## 2023-05-05 DIAGNOSIS — M19032 Primary osteoarthritis, left wrist: Secondary | ICD-10-CM | POA: Diagnosis not present

## 2023-05-05 DIAGNOSIS — Z79899 Other long term (current) drug therapy: Secondary | ICD-10-CM | POA: Insufficient documentation

## 2023-05-05 DIAGNOSIS — Z853 Personal history of malignant neoplasm of breast: Secondary | ICD-10-CM | POA: Insufficient documentation

## 2023-05-05 DIAGNOSIS — I10 Essential (primary) hypertension: Secondary | ICD-10-CM | POA: Diagnosis not present

## 2023-05-05 DIAGNOSIS — J45909 Unspecified asthma, uncomplicated: Secondary | ICD-10-CM | POA: Insufficient documentation

## 2023-05-05 DIAGNOSIS — S61419A Laceration without foreign body of unspecified hand, initial encounter: Secondary | ICD-10-CM

## 2023-05-05 DIAGNOSIS — W1830XA Fall on same level, unspecified, initial encounter: Secondary | ICD-10-CM | POA: Insufficient documentation

## 2023-05-05 DIAGNOSIS — Z8673 Personal history of transient ischemic attack (TIA), and cerebral infarction without residual deficits: Secondary | ICD-10-CM | POA: Insufficient documentation

## 2023-05-05 DIAGNOSIS — Z043 Encounter for examination and observation following other accident: Secondary | ICD-10-CM | POA: Diagnosis not present

## 2023-05-05 DIAGNOSIS — S61412A Laceration without foreign body of left hand, initial encounter: Secondary | ICD-10-CM | POA: Insufficient documentation

## 2023-05-05 DIAGNOSIS — M19042 Primary osteoarthritis, left hand: Secondary | ICD-10-CM | POA: Diagnosis not present

## 2023-05-05 DIAGNOSIS — R6884 Jaw pain: Secondary | ICD-10-CM | POA: Diagnosis not present

## 2023-05-05 MED ORDER — TETANUS-DIPHTH-ACELL PERTUSSIS 5-2.5-18.5 LF-MCG/0.5 IM SUSY
0.5000 mL | PREFILLED_SYRINGE | Freq: Once | INTRAMUSCULAR | Status: AC
  Administered 2023-05-05: 0.5 mL via INTRAMUSCULAR
  Filled 2023-05-05: qty 0.5

## 2023-05-05 MED ORDER — ACETAMINOPHEN 500 MG PO TABS
500.0000 mg | ORAL_TABLET | Freq: Once | ORAL | Status: AC
  Administered 2023-05-05: 500 mg via ORAL
  Filled 2023-05-05: qty 1

## 2023-05-05 NOTE — ED Triage Notes (Signed)
Pt fell face forward after picking up a box, pt was [place] concrete [place].  Pt with skin tear to left hand and cuts to to inside of bottom lip, c/o pain to right jaw. Pt is taking Plavix.

## 2023-05-05 NOTE — ED Notes (Signed)
EDP [place] aware of ongoing hypertension

## 2023-05-05 NOTE — ED Provider Notes (Signed)
[place] EMERGENCY DEPARTMENT AT [facility] Provider Note   CSN: [id number] Arrival date & time: 05/05/23  1459     History  Chief Complaint  Patient presents with   [name]    [name] is a 82 y.o. female.   Fall  Patient presents after a fall.  Lost her balance picking something up and landed face first in the garage.  Skin tear on hand but also laceration and pain to face.  Has laceration on tongue and right mandible hurts.  No loss conscious.  No neck pain.    Past Medical History:  Diagnosis Date   Aortic atherosclerosis (HCC)    Asthma    Breast CA (HCC)    Cancer (HCC)    right breast mastectomy   CVA (cerebral vascular accident) (HCC) 04/27/2015   DDD (degenerative disc disease), cervical    Duodenal ulcer    External hemorrhoids    Generalized anxiety disorder    H/O chest pain 03/24/2006   normal nuclear  study   Headache    High cholesterol    Hypertension    Mild aortic insufficiency 03/28/2006   echo   Normal cardiac stress test 2005   Pancreatitis    Personal history of chemotherapy    Personal history of radiation therapy    PONV (postoperative nausea and vomiting)    Sinus bradycardia on ECG    Stroke [facility])     Home Medications Prior to Admission medications   Medication Sig Start Date End Date Taking? Authorizing Provider  acetaminophen (TYLENOL) 500 MG tablet Take 500 mg by mouth every 6 (six) hours as needed. As needed for pain    [provider]  albuterol (VENTOLIN HFA) 108 (90 Base) MCG/ACT inhaler Use 2 puffs by mouth every 4-6 hours as needed for wheezing 03/01/23   [name], Deloris Ping, MD  amLODipine (NORVASC) 5 MG tablet Take 5 mg by mouth daily.    [provider]  atorvastatin (LIPITOR) 80 MG tablet Take 1 tablet (80 mg total) by mouth daily at 6 PM. 07/06/15   Marvel Plan, MD  azelastine (ASTELIN) 0.1 % nasal spray Place 1 spray into both nostrils 2 (two) times daily. Use in each nostril as directed  09/15/22   Particia Nearing, PA-C  clopidogrel (PLAVIX) 75 MG tablet Take 1 tablet (75 mg total) by mouth daily. 06/01/17   Marvel Plan, MD  dapagliflozin propanediol (FARXIGA) 10 MG TABS tablet Take 1 tablet (10 mg total) by mouth daily before breakfast. 03/01/23   [name], Deloris Ping, MD  DULoxetine (CYMBALTA) 30 MG capsule Take 30 mg by mouth daily. 05/05/21   [provider]  famotidine (PEPCID) 20 MG tablet One after supper 12/23/22   Nyoka Cowden, MD  fluticasone [facility]) 50 MCG/ACT nasal spray Place 2 sprays into both nostrils daily. 04/25/23   Leath-Warren, Sadie Haber, NP  furosemide (LASIX) 40 MG tablet Take 1 tablet (40 mg total) by mouth daily. 03/01/23   [name], Deloris Ping, MD  guaiFENesin (ROBITUSSIN) 100 MG/5ML liquid Take 10 mLs by mouth every 8 (eight) hours as needed for up to 10 days for cough or to loosen phlegm. 04/25/23 05/05/23  Leath-Warren, Sadie Haber, NP  isosorbide mononitrate (IMDUR) 30 MG 24 hr tablet TAKE 1 TABLET BY MOUTH DAILY 07/07/22   [name], Deloris Ping, MD  JARDIANCE 10 MG TABS tablet Take 10 mg by mouth daily. 03/03/23   [provider]  metFORMIN (GLUCOPHAGE) 500 MG tablet Take  1 tablet (500 mg total) by mouth daily with breakfast. 10/05/22 12/23/22  Lanae Boast, MD  montelukast (SINGULAIR) 10 MG tablet Take 1 tablet (10 mg total) by mouth at bedtime. 05/29/21   Leroy Sea, MD  ondansetron (ZOFRAN) 4 MG tablet Take 1 tablet (4 mg total) by mouth every 8 (eight) hours as needed for nausea or vomiting. 02/01/22   [name], MD  pantoprazole (PROTONIX) 40 MG tablet Take 30-60 min before first meal of the day 12/23/22   Nyoka Cowden, MD  phenazopyridine (PYRIDIUM) 200 MG tablet Take 1 tablet (200 mg total) by mouth 3 (three) times daily. 01/22/23   Linwood Dibbles, MD  potassium chloride (KLOR-CON) 10 MEQ tablet Take 1 tablet (10 mEq total) by mouth 2 (two) times daily. 03/01/23   [name], Deloris Ping, MD  predniSONE (DELTASONE) 10 MG tablet Take  4 each am x  2 days,   2 each am x 2 days,  1 each am x 2 days and stop 03/17/23   Nyoka Cowden, MD      Allergies    Oxycodone-acetaminophen, Ciprofloxacin, Codeine, Doxycycline, Percocet [oxycodone-acetaminophen], and Tramadol    Review of Systems   Review of Systems  Physical Exam Updated Vital Signs BP (!) 206/105   Pulse 60   Temp 98 F (36.7 C) (Oral)   Resp 20   Ht 5\' 4"  (1.626 m)   Wt 65.8 kg   SpO2 96%   BMI 24.89 kg/m  Physical Exam Vitals and nursing note reviewed.  HENT:     Head:     Comments: Abrasion to upper lip.  Approximately 1 cm laceration inside her lower lip and also 1 more on the mucosal side but still well-approximated.  Some bruising below the anterior mandible and tenderness on the right mandible. Eyes:     Pupils: Pupils are equal, round, and reactive to light.  Cardiovascular:     Rate and Rhythm: Regular rhythm.  Abdominal:     Tenderness: There is no abdominal tenderness.  Musculoskeletal:        General: No tenderness.     Comments: Skin tear over dorsum of hand over first MCP joint.  Some mild tenderness in the area.  Skin:    General: Skin is warm.     Capillary Refill: Capillary refill takes less than 2 seconds.  Neurological:     Mental Status: She is alert and oriented to person, place, and time.     ED Results / Procedures / Treatments   Labs (all labs ordered are listed, but only abnormal results are displayed) Labs Reviewed - No data to display  EKG None  Radiology CT Hand Left Wo Contrast  Result Date: 05/05/2023 CLINICAL DATA:  Trauma, fall. Hand fracture and possible 1st CMC dislocation. EXAM: CT OF THE LEFT HAND WITHOUT CONTRAST TECHNIQUE: Multidetector CT imaging of the left hand was performed according to the standard protocol. Multiplanar CT image reconstructions were also generated. RADIATION DOSE REDUCTION: This exam was performed according to the departmental dose-optimization program which includes automated exposure control,  adjustment of the mA and/or kV according to patient size and/or use of iterative reconstruction technique. COMPARISON:  Left hand series today. FINDINGS: Bones/Joint/Cartilage There is subluxation at the 1st carpometacarpal joint which appears to be related to moderate to advanced degenerative joint disease. There is joint space narrowing and fragmentation related to degenerative changes. No visible fracture. Ligaments Suboptimally assessed by CT. Muscles and Tendons Unremarkable Soft tissues Unremarkable IMPRESSION:  No visible fracture, in particular at the 1st [facility] joint. There is subluxation and fragmentation of the joint which appears to be associated with moderate to advanced degenerative joint disease. Electronically Signed   By: Charlett Nose M.D.   On: 05/05/2023 18:39   CT Head Wo Contrast  Result Date: 05/05/2023 CLINICAL DATA:  Fall, laceration to lip and right jaw pain, anticoagulation EXAM: CT HEAD WITHOUT CONTRAST CT MAXILLOFACIAL WITHOUT CONTRAST CT CERVICAL SPINE WITHOUT CONTRAST TECHNIQUE: Multidetector CT imaging of the head, cervical spine, and maxillofacial structures were performed using the standard protocol without intravenous contrast. Multiplanar CT image reconstructions of the cervical spine and maxillofacial structures were also generated. RADIATION DOSE REDUCTION: This exam was performed according to the departmental dose-optimization program which includes automated exposure control, adjustment of the mA and/or kV according to patient size and/or use of iterative reconstruction technique. COMPARISON:  10/02/2022 FINDINGS: CT HEAD FINDINGS Brain: No evidence of acute infarction, hemorrhage, hydrocephalus, extra-axial collection or mass lesion/mass effect. Periventricular white matter hypodensity. Vascular: No hyperdense vessel or unexpected calcification. CT FACIAL BONES FINDINGS Skull: Normal. Negative for fracture or focal lesion. Facial bones: No displaced fractures or dislocations.  Sinuses/Orbits: No acute finding. Other: None. CT CERVICAL SPINE FINDINGS Alignment: Degenerative straightening of the normal cervical lordosis. Skull base and vertebrae: No acute fracture. No primary bone lesion or focal pathologic process. Soft tissues and spinal canal: No prevertebral fluid or swelling. No visible canal hematoma. Disc levels: Moderate disc space height loss and osteophytosis of the lower cervical levels, worst from C5-C7. Upper chest: Negative. Other: None. IMPRESSION: 1. No acute intracranial pathology. Small-vessel white matter disease. 2. No displaced fractures or dislocations of the facial bones. 3. No fracture or static subluxation of the cervical spine. 4. Moderate disc degenerative disease of the lower cervical levels, worst from C5-C7. Electronically Signed   By: Jearld Lesch M.D.   On: 05/05/2023 17:11   CT Maxillofacial Wo Contrast  Result Date: 05/05/2023 CLINICAL DATA:  Fall, laceration to lip and right jaw pain, anticoagulation EXAM: CT HEAD WITHOUT CONTRAST CT MAXILLOFACIAL WITHOUT CONTRAST CT CERVICAL SPINE WITHOUT CONTRAST TECHNIQUE: Multidetector CT imaging of the head, cervical spine, and maxillofacial structures were performed using the standard protocol without intravenous contrast. Multiplanar CT image reconstructions of the cervical spine and maxillofacial structures were also generated. RADIATION DOSE REDUCTION: This exam was performed according to the departmental dose-optimization program which includes automated exposure control, adjustment of the mA and/or kV according to patient size and/or use of iterative reconstruction technique. COMPARISON:  10/02/2022 FINDINGS: CT HEAD FINDINGS Brain: No evidence of acute infarction, hemorrhage, hydrocephalus, extra-axial collection or mass lesion/mass effect. Periventricular white matter hypodensity. Vascular: No hyperdense vessel or unexpected calcification. CT FACIAL BONES FINDINGS Skull: Normal. Negative for fracture or  focal lesion. Facial bones: No displaced fractures or dislocations. Sinuses/Orbits: No acute finding. Other: None. CT CERVICAL SPINE FINDINGS Alignment: Degenerative straightening of the normal cervical lordosis. Skull base and vertebrae: No acute fracture. No primary bone lesion or focal pathologic process. Soft tissues and spinal canal: No prevertebral fluid or swelling. No visible canal hematoma. Disc levels: Moderate disc space height loss and osteophytosis of the lower cervical levels, worst from C5-C7. Upper chest: Negative. Other: None. IMPRESSION: 1. No acute intracranial pathology. Small-vessel white matter disease. 2. No displaced fractures or dislocations of the facial bones. 3. No fracture or static subluxation of the cervical spine. 4. Moderate disc degenerative disease of the lower cervical levels, worst from C5-C7. Electronically Signed  By: Jearld Lesch M.D.   On: 05/05/2023 17:11   CT Cervical Spine Wo Contrast  Result Date: 05/05/2023 CLINICAL DATA:  Fall, laceration to lip and right jaw pain, anticoagulation EXAM: CT HEAD WITHOUT CONTRAST CT MAXILLOFACIAL WITHOUT CONTRAST CT CERVICAL SPINE WITHOUT CONTRAST TECHNIQUE: Multidetector CT imaging of the head, cervical spine, and maxillofacial structures were performed using the standard protocol without intravenous contrast. Multiplanar CT image reconstructions of the cervical spine and maxillofacial structures were also generated. RADIATION DOSE REDUCTION: This exam was performed according to the departmental dose-optimization program which includes automated exposure control, adjustment of the mA and/or kV according to patient size and/or use of iterative reconstruction technique. COMPARISON:  10/02/2022 FINDINGS: CT HEAD FINDINGS Brain: No evidence of acute infarction, hemorrhage, hydrocephalus, extra-axial collection or mass lesion/mass effect. Periventricular white matter hypodensity. Vascular: No hyperdense vessel or unexpected  calcification. CT FACIAL BONES FINDINGS Skull: Normal. Negative for fracture or focal lesion. Facial bones: No displaced fractures or dislocations. Sinuses/Orbits: No acute finding. Other: None. CT CERVICAL SPINE FINDINGS Alignment: Degenerative straightening of the normal cervical lordosis. Skull base and vertebrae: No acute fracture. No primary bone lesion or focal pathologic process. Soft tissues and spinal canal: No prevertebral fluid or swelling. No visible canal hematoma. Disc levels: Moderate disc space height loss and osteophytosis of the lower cervical levels, worst from C5-C7. Upper chest: Negative. Other: None. IMPRESSION: 1. No acute intracranial pathology. Small-vessel white matter disease. 2. No displaced fractures or dislocations of the facial bones. 3. No fracture or static subluxation of the cervical spine. 4. Moderate disc degenerative disease of the lower cervical levels, worst from C5-C7. Electronically Signed   By: Jearld Lesch M.D.   On: 05/05/2023 17:11   DG Hand Complete Left  Result Date: 05/05/2023 CLINICAL DATA:  Fall EXAM: LEFT HAND - COMPLETE 3 VIEW COMPARISON:  None Available. FINDINGS: Severe osteopenia. There is a fracture of the base of the first metacarpal with subluxation or dislocation of the first carpometacarpal joint. Small adjacent bone fragments. No additional fracture or dislocation. Slight asymmetry of the distal interphalangeal joint of second digit. Mild degenerative changes IMPRESSION: Fracture with possible dislocation of the first carpometacarpal joint. Additional imaging with CT could be considered as clinically appropriate for further delineation. Slight asymmetry of the distal interphalangeal joint of second digit. Osteopenia with degenerative change Electronically Signed   By: Karen Kays M.D.   On: 05/05/2023 16:58    Procedures Procedures    Medications Ordered in ED Medications  Tdap (BOOSTRIX) injection 0.5 mL (0.5 mLs Intramuscular Given 05/05/23  1640)  acetaminophen (TYLENOL) tablet 500 mg (500 mg Oral Given 05/05/23 1650)  acetaminophen (TYLENOL) tablet 500 mg (500 mg Oral Given 05/05/23 1826)    ED Course/ Medical Decision Making/ A&P                                 Medical Decision Making Amount and/or Complexity of Data Reviewed Radiology: ordered.  Risk OTC drugs. Prescription drug management.   Patient with fall.  Hit face.  Also left hand injury.  Head CT maxillofacial CT and cervical spine CT overall reassuring.  No fracture seen.  However x-ray did show potential fracture dislocation of the first MCP joint.  CT scan done and showed some chronic subluxation without fracture.  Discussed with Dr. Romeo Apple, who reviewed the images.  Thumb spica placed and follow-up on Monday for an appointment next week.  Appears  stable for discharge.  Of note does have hypertension.  Elevated here and has medicines she is post to be taking today.  Will take when she gets home.  Doubt endorgan damage.  Unrelated to the cause of the fall.        Final Clinical Impression(s) / ED Diagnoses Final diagnoses:  Fall, initial encounter  Lip laceration, initial encounter  Sprain of metacarpophalangeal (MCP) joint of left thumb, initial encounter  Skin tear of hand without complication, initial encounter    Rx / DC Orders ED Discharge Orders     None         Benjiman Core, MD 05/05/23 1947

## 2023-05-05 NOTE — ED Notes (Signed)
Steristrips applied to skin tear on left hand per MD verbal. Nonstick dressing applied, wrapped in curlex, and splint applied with MD approval

## 2023-05-08 ENCOUNTER — Ambulatory Visit (INDEPENDENT_AMBULATORY_CARE_PROVIDER_SITE_OTHER): Payer: HMO | Admitting: Orthopedic Surgery

## 2023-05-08 ENCOUNTER — Telehealth: Payer: Self-pay | Admitting: Orthopedic Surgery

## 2023-05-08 DIAGNOSIS — Z87828 Personal history of other (healed) physical injury and trauma: Secondary | ICD-10-CM

## 2023-05-08 NOTE — Telephone Encounter (Signed)
Patient came in the office stating her hand is bleeding and she needs someone to see it today.    Dr. Romeo Apple said for her to come on 28th in Staff Message earlier.   I advised Toniann Fail and sent a message in the ALL TEAMS for review.  Dr. Romeo Apple said for her to come on 28th in Staff Message earlier.

## 2023-05-08 NOTE — Telephone Encounter (Signed)
I think Toniann Fail took care of this.  To Toniann Fail to see if we need to work in sooner than Wednesday since she is bleeding.

## 2023-05-08 NOTE — Progress Notes (Signed)
Patient came into the office this morning, saying her left hand wound was bleeding through the dressing. She was seen in the ED on 05/05/23 s/p fall.   I removed dressing to evaluate wound.  No bleeding, steri strips in place.  Re wrapped hand with telfa pad, gauze, and ace wrap.  Reapplied wrist splint for now.  Patient was advised to leave the dressing on until she sees Dr Romeo Apple this Wed 05/10/23.  Voiced understanding.

## 2023-05-08 NOTE — Telephone Encounter (Signed)
Nurse note to follow.

## 2023-05-10 ENCOUNTER — Ambulatory Visit (INDEPENDENT_AMBULATORY_CARE_PROVIDER_SITE_OTHER): Payer: HMO | Admitting: Orthopedic Surgery

## 2023-05-10 ENCOUNTER — Encounter: Payer: Self-pay | Admitting: Orthopedic Surgery

## 2023-05-10 VITALS — BP 126/66 | HR 67 | Ht 64.0 in | Wt 145.0 lb

## 2023-05-10 DIAGNOSIS — S61419A Laceration without foreign body of unspecified hand, initial encounter: Secondary | ICD-10-CM | POA: Diagnosis not present

## 2023-05-10 NOTE — Progress Notes (Signed)
Office Visit Note   Patient: [name]           Date of Birth: 1941/08/17           MRN: [id number] Visit Date: 05/10/2023 Requested by: Irven Coe, MD [address] [place],  [place] [address] PCP: Irven Coe, MD   Assessment & Plan:   Encounter Diagnosis  Name Primary?  . Laceration of dorsum of hand Yes    No orders of the defined types were placed in this encounter.   82 year old female laceration left hand seems to be doing well.  Recommend Neosporin twice a day  Remove Steri-Strips in 5 days  Follow-up for wound check     Subjective: Chief Complaint  Patient presents with  . Hand Injury    Right / fell 05/02/23    HPI: This 82 year old right-hand-dominant female fell and injured her left hand.  Initial films suggested a CMC joint subluxation.  However CT scan confirmed that this was just arthritis  This is consistent with her history of previous injections for [facility] arthritis  She did sustain a laceration and is here for evaluation              ROS: Denies numbness or tingling but has chronic pain at the [facility] joint of the left hand   Images personally read and my interpretation : X-rays and CT scan were evaluated  The x-rays in my opinion show [facility] joint arthritis with subluxation  I was able to look at x-rays from my previous evaluation which showed the same amount of subluxation indicating that there was no new joint destruction  CT scan confirm no fracture or dislocation just CMC arthritis with subluxation of the joint as is characteristic of this problem  Visit Diagnoses:  1. Laceration of dorsum of hand      Follow-Up Instructions: Return in about 19 days (around 05/29/2023) for follow up hand .    Objective: Vital Signs: BP 126/66   Pulse 67   Ht 5\' 4"  (1.626 m)   Wt 145 lb (65.8 kg)   BMI 24.89 kg/m   Physical Exam Vitals and nursing note reviewed.  Constitutional:      Appearance: Normal appearance.  HENT:      Head: Normocephalic and atraumatic.  Eyes:     General: No scleral icterus.       Right eye: No discharge.        Left eye: No discharge.     Extraocular Movements: Extraocular movements intact.     Conjunctiva/sclera: Conjunctivae normal.     Pupils: Pupils are equal, round, and reactive to light.  Cardiovascular:     Rate and Rhythm: Normal rate.     Pulses: Normal pulses.  Skin:    General: Skin is warm and dry.     Capillary Refill: Capillary refill takes less than 2 seconds.     Findings: Bruising present.  Neurological:     General: No focal deficit present.     Mental Status: She is alert and oriented to person, place, and time.  Psychiatric:        Mood and Affect: Mood normal.        Behavior: Behavior normal.        Thought Content: Thought content normal.        Judgment: Judgment normal.     Left Hand Exam   Comments:  Left hand.  Laceration please see pictures in the media section  Laceration looks good.  Swelling and bruising noted several days ago has resolved.  No sign of infection is noted  Steri-Strips tape intact  Subluxation of the left thumb noted.  This is at the [facility] joint.  Intact IP joint motion in flexion extension sensation normal and intact color capillary refill normal in left thumb.     Specialty Comments:  No specialty comments available.  Imaging: No results found.   PMFS History: Patient Active Problem List   Diagnosis Date Noted  . Acute respiratory failure with hypoxia (HCC) 10/11/2022  . RSV (respiratory syncytial virus pneumonia) 10/03/2022  . Fracture of proximal phalanx of lesser toe of right foot 10/02/2022  . Chronic diastolic CHF (congestive heart failure) (HCC) 10/02/2022  . Lactic acidosis 10/02/2022  . Mixed hyperlipidemia due to type 2 diabetes mellitus (HCC) 10/02/2022  . Antibiotic reaction, initial encounter 10/02/2022  . Abnormal CT of the chest 10/02/2022  . Dehydration with hyponatremia 10/01/2022  . Type  2 diabetes mellitus with hyperglycemia (HCC) 10/01/2022  . Bronchitis 05/25/2021  . Asthma exacerbation 05/25/2021  . Angina pectoris (HCC)   . Arthritis of carpometacarpal [facility]) joint of left thumb 10/03/2017  . De Quervain's tenosynovitis, left 10/03/2017  . History of stroke 06/13/2017  . DOE (dyspnea on exertion) 05/24/2017  . Occipital neuralgia of right side 11/02/2016  . Palpitations 06/17/2015  . Cerebrovascular accident (CVA) due to occlusion of left middle cerebral artery (HCC) 06/17/2015  . Hyperlipidemia 06/17/2015  . HLD (hyperlipidemia)   . Intracranial vascular stenosis   . Cerebral infarction due to occlusion of left middle cerebral artery (HCC)   . Occipital headache   . Extrinsic asthma 07/02/2014  . Migraine variant with status migrainosus 07/02/2014  . Benign paroxysmal positional vertigo 07/02/2014  . 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 Essential hypertension 03/03/2014  . Mild aortic insufficiency   . H/O chest pain   . Sinus bradycardia on ECG   . GERD without esophagitis 03/24/2008  . ADENOCARCINOMA, BREAST, RIGHT 06/30/2007  . ASTHMA 06/30/2007  . Upper airway cough syndrome 06/30/2007   Past Medical History:  Diagnosis Date  . Aortic atherosclerosis (HCC)   . Asthma   . Breast CA (HCC)   . Cancer [facility])    right breast mastectomy  . CVA (cerebral vascular accident) (HCC) 04/27/2015  . DDD (degenerative disc disease), cervical   . Duodenal ulcer   . External hemorrhoids   . Generalized anxiety disorder   . H/O chest pain 03/24/2006   normal nuclear  study  . Headache   . High cholesterol   . Hypertension   . Mild aortic insufficiency 03/28/2006   echo  . Normal cardiac stress test 2005  . Pancreatitis   . Personal history of chemotherapy   . Personal history of radiation therapy   . PONV (postoperative nausea and vomiting)   . Sinus bradycardia on ECG   . Stroke [facility])     Family History  Problem Relation Age of Onset  . Heart  attack Mother   . Heart disease Mother   . Hypertension Mother   . Asthma Mother   . Heart attack Father   . Heart disease Father   . Hypertension Sister   . Heart attack Sister   . Colon cancer Sister 37  . Hypertension Sister   . Heart attack Sister   . Heart attack Sister   . Lung disease Daughter   . Thyroid disease Daughter     Past Surgical History:  Procedure Laterality Date  . ABDOMINAL HYSTERECTOMY    . APPENDECTOMY     "30 years ago"  . childbirth     x 2  . CHOLECYSTECTOMY     "15 years ago"  . ESOPHAGOGASTRODUODENOSCOPY N/A 03/04/2014   Procedure: ESOPHAGOGASTRODUODENOSCOPY (EGD);  Surgeon: Theda Belfast, MD;  Location: [facility] ENDOSCOPY;  Service: Endoscopy;  Laterality: N/A;  . MASTECTOMY  1993   right breast  . RIGHT/LEFT HEART CATH AND CORONARY ANGIOGRAPHY N/A 05/30/2019   Procedure: RIGHT/LEFT HEART CATH AND CORONARY ANGIOGRAPHY;  Surgeon: Corky Crafts, MD;  Location: [facility] INVASIVE CV LAB;  Service: Cardiovascular;  Laterality: N/A;  . TUBAL LIGATION     Social History   Occupational History  . Occupation: retired  Tobacco Use  . Smoking status: Never  . Smokeless tobacco: Never  Vaping Use  . Vaping status: Never Used  Substance and Sexual Activity  . Alcohol use: No    Alcohol/week: 0.0 standard drinks of alcohol  . Drug use: No  . Sexual activity: Not Currently

## 2023-05-10 NOTE — Patient Instructions (Signed)
Take the white strips off on Monday and today begin using neosporin bandages twice a day starting today

## 2023-05-16 DIAGNOSIS — R0609 Other forms of dyspnea: Secondary | ICD-10-CM | POA: Diagnosis not present

## 2023-05-16 DIAGNOSIS — R2689 Other abnormalities of gait and mobility: Secondary | ICD-10-CM | POA: Diagnosis not present

## 2023-05-16 DIAGNOSIS — W19XXXA Unspecified fall, initial encounter: Secondary | ICD-10-CM | POA: Diagnosis not present

## 2023-05-16 DIAGNOSIS — R110 Nausea: Secondary | ICD-10-CM | POA: Diagnosis not present

## 2023-05-18 ENCOUNTER — Ambulatory Visit: Payer: HMO

## 2023-05-23 ENCOUNTER — Ambulatory Visit: Payer: HMO | Attending: Cardiovascular Disease

## 2023-05-23 DIAGNOSIS — R062 Wheezing: Secondary | ICD-10-CM | POA: Diagnosis not present

## 2023-05-23 DIAGNOSIS — R0602 Shortness of breath: Secondary | ICD-10-CM | POA: Diagnosis not present

## 2023-05-23 DIAGNOSIS — Z79899 Other long term (current) drug therapy: Secondary | ICD-10-CM

## 2023-05-24 LAB — BASIC METABOLIC PANEL
BUN/Creatinine Ratio: 17 (ref 12–28)
BUN: 13 mg/dL (ref 8–27)
CO2: 25 mmol/L (ref 20–29)
Calcium: 10.1 mg/dL (ref 8.7–10.3)
Chloride: 103 mmol/L (ref 96–106)
Creatinine, Ser: 0.77 mg/dL (ref 0.57–1.00)
Glucose: 144 mg/dL — ABNORMAL HIGH (ref 70–99)
Potassium: 3.9 mmol/L (ref 3.5–5.2)
Sodium: 141 mmol/L (ref 134–144)
eGFR: 76 mL/min/{1.73_m2} (ref >59)

## 2023-05-26 ENCOUNTER — Ambulatory Visit (HOSPITAL_COMMUNITY): Payer: HMO | Attending: Family Medicine | Admitting: Physical Therapy

## 2023-05-26 ENCOUNTER — Other Ambulatory Visit: Payer: Self-pay

## 2023-05-26 DIAGNOSIS — R2689 Other abnormalities of gait and mobility: Secondary | ICD-10-CM | POA: Diagnosis not present

## 2023-05-26 DIAGNOSIS — M6281 Muscle weakness (generalized): Secondary | ICD-10-CM | POA: Diagnosis not present

## 2023-05-26 DIAGNOSIS — R296 Repeated falls: Secondary | ICD-10-CM | POA: Diagnosis not present

## 2023-05-26 NOTE — Therapy (Signed)
OUTPATIENT PHYSICAL THERAPY LOWER EXTREMITY EVALUATION   Patient Name: [name] MRN: [id number] DOB:12-04-1940, 82 y.o., female Today's Date: 05/26/2023  END OF SESSION:  PT End of Session - 05/26/23 1406     Visit Number 1    Number of Visits 12    Date for PT Re-Evaluation 07/07/23    Authorization Type [employer]    Progress Note Due on Visit 10    PT Start Time 0900    PT Stop Time 0940    PT Time Calculation (min) 40 min    Activity Tolerance Patient tolerated treatment well    Behavior During Therapy [facility] for tasks assessed/performed             Past Medical History:  Diagnosis Date   Aortic atherosclerosis (HCC)    Asthma    Breast CA (HCC)    Cancer (HCC)    right breast mastectomy   CVA (cerebral vascular accident) (HCC) 04/27/2015   DDD (degenerative disc disease), cervical    Duodenal ulcer    External hemorrhoids    Generalized anxiety disorder    H/O chest pain 03/24/2006   normal nuclear  study   Headache    High cholesterol    Hypertension    Mild aortic insufficiency 03/28/2006   echo   Normal cardiac stress test 2005   Pancreatitis    Personal history of chemotherapy    Personal history of radiation therapy    PONV (postoperative nausea and vomiting)    Sinus bradycardia on ECG    Stroke [facility])    Past Surgical History:  Procedure Laterality Date   ABDOMINAL HYSTERECTOMY     APPENDECTOMY     "30 years ago"   childbirth     x 2   CHOLECYSTECTOMY     "15 years ago"   ESOPHAGOGASTRODUODENOSCOPY N/A 03/04/2014   Procedure: ESOPHAGOGASTRODUODENOSCOPY (EGD);  Surgeon: Theda Belfast, MD;  Location: [facility] ENDOSCOPY;  Service: Endoscopy;  Laterality: N/A;   MASTECTOMY  1993   right breast   RIGHT/LEFT HEART CATH AND CORONARY ANGIOGRAPHY N/A 05/30/2019   Procedure: RIGHT/LEFT HEART CATH AND CORONARY ANGIOGRAPHY;  Surgeon: Corky Crafts, MD;  Location: [facility] INVASIVE CV LAB;  Service: Cardiovascular;  Laterality: N/A;   TUBAL  LIGATION     Patient Active Problem List   Diagnosis Date Noted   Acute respiratory failure with hypoxia (HCC) 10/11/2022   RSV (respiratory syncytial virus pneumonia) 10/03/2022   Fracture of proximal phalanx of lesser toe of right foot 10/02/2022   Chronic diastolic CHF (congestive heart failure) (HCC) 10/02/2022   Lactic acidosis 10/02/2022   Mixed hyperlipidemia due to type 2 diabetes mellitus (HCC) 10/02/2022   Antibiotic reaction, initial encounter 10/02/2022   Abnormal CT of the chest 10/02/2022   Dehydration with hyponatremia 10/01/2022   Type 2 diabetes mellitus with hyperglycemia (HCC) 10/01/2022   Bronchitis 05/25/2021   Asthma exacerbation 05/25/2021   Angina pectoris (HCC)    Arthritis of carpometacarpal (CMC) joint of left thumb 10/03/2017   De Quervain's tenosynovitis, left 10/03/2017   History of stroke 06/13/2017   DOE (dyspnea on exertion) 05/24/2017   Occipital neuralgia of right side 11/02/2016   Palpitations 06/17/2015   Cerebrovascular accident (CVA) due to occlusion of left middle cerebral artery (HCC) 06/17/2015   Hyperlipidemia 06/17/2015   HLD (hyperlipidemia)    Intracranial vascular stenosis    Cerebral infarction due to occlusion of left middle cerebral artery (HCC)    Occipital headache  Extrinsic asthma 07/02/2014   Migraine variant with status migrainosus 07/02/2014   Benign paroxysmal positional vertigo 07/02/2014   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/2014   Essential hypertension 03/03/2014   Mild aortic insufficiency    H/O chest pain    Sinus bradycardia on ECG    GERD without esophagitis 03/24/2008   ADENOCARCINOMA, BREAST, RIGHT 06/30/2007   ASTHMA 06/30/2007   Upper airway cough syndrome 06/30/2007    PCP: Irven Coe  REFERRING PROVIDER: Irven Coe, MD  REFERRING DIAG: R26.89 (ICD-10-CM) - Other abnormalities of gait and mobility  THERAPY DIAG:  Other abnormalities of gait and mobility  Muscle weakness  (generalized)  Rationale for Evaluation and Treatment: Rehabilitation  ONSET DATE: 6/1/33f4  SUBJECTIVE:   SUBJECTIVE STATEMENT: Ms. Flink states that it's like she can not pick up her feet. She is much slower walking than she use to be.   She has no energy.  She has fallen three times in the past six months.   She states that she is not having pain anywhere.   PERTINENT HISTORY: As above  PAIN:  Are you having pain? No  PRECAUTIONS: Fall  WEIGHT BEARING RESTRICTIONS: No  FALLS:  Has patient fallen in last 6 months? Yes. Number of falls 3  LIVING ENVIRONMENT: Lives with: lives with their spouse Lives in: House/apartment Stairs: No Has following equipment at home:  none  OCCUPATION: retired   PLOF: Independent  PATIENT GOALS: to be able to walk better   NEXT MD VISIT: not sure   OBJECTIVE:   COGNITION: Overall cognitive status: Within functional limits for tasks assessed   POSTURE: No Significant postural limitations   LOWER EXTREMITY ROM: wfl   LOWER EXTREMITY MMT:  MMT Right eval Left eval  Hip flexion 4/5 4/5  Hip extension 3/5 3/5  Hip abduction 3/5 3/5  Hip adduction    Hip internal rotation    Hip external rotation    Knee flexion 3/5 3/5  Knee extension 3+/5 5/5  Ankle dorsiflexion 4/5 4+/5  Ankle plantarflexion 4/5 5/5  Ankle inversion    Ankle eversion     (Blank rows = not tested)  FUNCTIONAL TESTS:  30 seconds chair stand test:  11 ; 10 is average for age and sex  2 minute walk test: 445ft  decreased ankle motion, forward bent, decreased trunk rotation. Single leg stance:  RT: 2" , LT: 7"    TODAY'S TREATMENT:                                                                                                                              DATE: 05/26/23 Sit to stand x 10 Single leg stance x 3 B Heel to gait     PATIENT EDUCATION:  Education details: HEP Person educated: Patient Education method: [employer] Technology [employer] Education  comprehension: returned demonstration  HOME EXERCISE PROGRAM: Access Code: [email or web address] URL: [email or web address] Date: 05/26/2023 Prepared by: Virgina Organ  Exercises - Sit to Stand  -  2 x daily - 7 x weekly - 1 sets - 10 reps - Standing Single Leg Stance with Counter Support  - 2 x daily - 7 x weekly - 1 sets - 10 reps - as long as possible  hold -heel toe gait   ASSESSMENT:  CLINICAL IMPRESSION: Patient is an 82  y.o. female who was seen today for physical therapy evaluation and treatment for abnormal gait and mobility with history of falling.  Evaluation demonstrates decreased balance, decreased strength, decreased activity tolerance and abnormal gait.  Ms. Valek will benefit from skilled PT to address these areas and maximize her functional ability as well as decreasing her fall risk.    OBJECTIVE IMPAIRMENTS: Abnormal gait, decreased activity tolerance, decreased balance, difficulty walking, and decreased strength.   ACTIVITY LIMITATIONS: carrying, lifting, standing, squatting, and locomotion level  PARTICIPATION LIMITATIONS: cleaning, shopping, and community activity  REHAB POTENTIAL: Good  CLINICAL DECISION MAKING: Stable/uncomplicated  EVALUATION COMPLEXITY: Moderate   GOALS: Goals reviewed with patient? No  SHORT TERM GOALS: Target date: 06/16/23 Pt to be I in HEP to be able to walk for 15 minutes with confidence.  Baseline: Goal status: INITIAL  2.  Pt to increase her LE mm strength 1/2 grade to be able to walk for 15 minutes with confidence.  Goal status: INITIAL  3.  Pt to be able to single leg stance B x 15 seconds to decrease risk of falling  Baseline:  Goal status: INITIAL  LONG TERM GOALS: Target date: 1025/24  Pt to be I in HEP to be able to walk for 30 minutes with confidence.  Baseline:  Goal status: INITIAL  2.    Pt to increase her LE mm strength 1/2 grade to be able to walk for 30 minutes with confidence Baseline:  Goal status:  INITIAL  3. Pt to be able to single leg stance B x 20  seconds to decrease risk of falling Baseline:  Goal status: INITIAL   PLAN:  PT FREQUENCY: 2x/week  PT DURATION: 6 weeks  PLANNED INTERVENTIONS: Therapeutic exercises, Neuromuscular re-education, Balance training, Gait training, Patient/Family education, Self Care, and Manual therapy  PLAN FOR NEXT SESSION: progress strengthening and balance activity, May benefit from POWER activities   Virgina Organ, [place] CLT [phone]  05/26/2023, 2:07 PM

## 2023-05-29 ENCOUNTER — Ambulatory Visit (INDEPENDENT_AMBULATORY_CARE_PROVIDER_SITE_OTHER): Payer: HMO | Admitting: Orthopedic Surgery

## 2023-05-29 ENCOUNTER — Encounter: Payer: Self-pay | Admitting: Orthopedic Surgery

## 2023-05-29 VITALS — BP 127/60 | HR 80 | Ht 64.0 in | Wt 140.0 lb

## 2023-05-29 DIAGNOSIS — S61419A Laceration without foreign body of unspecified hand, initial encounter: Secondary | ICD-10-CM | POA: Diagnosis not present

## 2023-05-29 NOTE — Progress Notes (Signed)
Follow-up visit  Encounter Diagnosis  Name Primary?   Laceration of dorsum of hand Yes    Skin laceration left hand  Patient complains of itching  Wound healed fine no problems at this point no signs of infection  Patient released

## 2023-05-30 ENCOUNTER — Encounter (HOSPITAL_COMMUNITY): Payer: Self-pay

## 2023-05-30 ENCOUNTER — Ambulatory Visit (HOSPITAL_COMMUNITY): Payer: HMO

## 2023-05-30 DIAGNOSIS — R2689 Other abnormalities of gait and mobility: Secondary | ICD-10-CM | POA: Diagnosis not present

## 2023-05-30 DIAGNOSIS — M6281 Muscle weakness (generalized): Secondary | ICD-10-CM

## 2023-05-30 DIAGNOSIS — R296 Repeated falls: Secondary | ICD-10-CM

## 2023-05-30 NOTE — Therapy (Signed)
OUTPATIENT PHYSICAL THERAPY LOWER EXTREMITY TREATMENT   Patient Name: [name] MRN: [id number] DOB:June 03, 1941, 82 y.o., female Today's Date: 05/30/2023  END OF SESSION:  PT End of Session - 05/30/23 0849     Visit Number 2    Number of Visits 12    Date for PT Re-Evaluation 07/07/23    Authorization Type [employer]    Progress Note Due on Visit 10    PT Start Time 0804    PT Stop Time 0843    PT Time Calculation (min) 39 min    Activity Tolerance Patient tolerated treatment well    Behavior During Therapy [facility] for tasks assessed/performed              Past Medical History:  Diagnosis Date   Aortic atherosclerosis (HCC)    Asthma    Breast CA (HCC)    Cancer (HCC)    right breast mastectomy   CVA (cerebral vascular accident) (HCC) 04/27/2015   DDD (degenerative disc disease), cervical    Duodenal ulcer    External hemorrhoids    Generalized anxiety disorder    H/O chest pain 03/24/2006   normal nuclear  study   Headache    High cholesterol    Hypertension    Mild aortic insufficiency 03/28/2006   echo   Normal cardiac stress test 2005   Pancreatitis    Personal history of chemotherapy    Personal history of radiation therapy    PONV (postoperative nausea and vomiting)    Sinus bradycardia on ECG    Stroke [facility])    Past Surgical History:  Procedure Laterality Date   ABDOMINAL HYSTERECTOMY     APPENDECTOMY     "30 years ago"   childbirth     x 2   CHOLECYSTECTOMY     "15 years ago"   ESOPHAGOGASTRODUODENOSCOPY N/A 03/04/2014   Procedure: ESOPHAGOGASTRODUODENOSCOPY (EGD);  Surgeon: Theda Belfast, MD;  Location: [facility] ENDOSCOPY;  Service: Endoscopy;  Laterality: N/A;   MASTECTOMY  1993   right breast   RIGHT/LEFT HEART CATH AND CORONARY ANGIOGRAPHY N/A 05/30/2019   Procedure: RIGHT/LEFT HEART CATH AND CORONARY ANGIOGRAPHY;  Surgeon: Corky Crafts, MD;  Location: [facility] INVASIVE CV LAB;  Service: Cardiovascular;  Laterality: N/A;   TUBAL  LIGATION     Patient Active Problem List   Diagnosis Date Noted   Acute respiratory failure with hypoxia (HCC) 10/11/2022   RSV (respiratory syncytial virus pneumonia) 10/03/2022   Fracture of proximal phalanx of lesser toe of right foot 10/02/2022   Chronic diastolic CHF (congestive heart failure) (HCC) 10/02/2022   Lactic acidosis 10/02/2022   Mixed hyperlipidemia due to type 2 diabetes mellitus (HCC) 10/02/2022   Antibiotic reaction, initial encounter 10/02/2022   Abnormal CT of the chest 10/02/2022   Dehydration with hyponatremia 10/01/2022   Type 2 diabetes mellitus with hyperglycemia (HCC) 10/01/2022   Bronchitis 05/25/2021   Asthma exacerbation 05/25/2021   Angina pectoris (HCC)    Arthritis of carpometacarpal (CMC) joint of left thumb 10/03/2017   De Quervain's tenosynovitis, left 10/03/2017   History of stroke 06/13/2017   DOE (dyspnea on exertion) 05/24/2017   Occipital neuralgia of right side 11/02/2016   Palpitations 06/17/2015   Cerebrovascular accident (CVA) due to occlusion of left middle cerebral artery (HCC) 06/17/2015   Hyperlipidemia 06/17/2015   HLD (hyperlipidemia)    Intracranial vascular stenosis    Cerebral infarction due to occlusion of left middle cerebral artery (HCC)    Occipital headache  Extrinsic asthma 07/02/2014   Migraine variant with status migrainosus 07/02/2014   Benign paroxysmal positional vertigo 07/02/2014   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/2014   Essential hypertension 03/03/2014   Mild aortic insufficiency    H/O chest pain    Sinus bradycardia on ECG    GERD without esophagitis 03/24/2008   ADENOCARCINOMA, BREAST, RIGHT 06/30/2007   ASTHMA 06/30/2007   Upper airway cough syndrome 06/30/2007    PCP: Irven Coe  REFERRING PROVIDER: Irven Coe, MD  REFERRING DIAG: R26.89 (ICD-10-CM) - Other abnormalities of gait and mobility  THERAPY DIAG:  Other abnormalities of gait and mobility  Repeated falls  Muscle weakness  (generalized)  Rationale for Evaluation and Treatment: Rehabilitation  ONSET DATE: 6/1/43f4  SUBJECTIVE:   SUBJECTIVE STATEMENT: 05/30/23:  Reports she is feeling good.  No reports of pain or recent fall.  Has began the HEP without questions.  Eval:  Ms. Turcios states that it's like she can not pick up her feet. She is much slower walking than she use to be.   She has no energy.  She has fallen three times in the past six months.   She states that she is not having pain anywhere.   PERTINENT HISTORY: As above  PAIN:  Are you having pain? No  PRECAUTIONS: Fall  WEIGHT BEARING RESTRICTIONS: No  FALLS:  Has patient fallen in last 6 months? Yes. Number of falls 3  LIVING ENVIRONMENT: Lives with: lives with their spouse Lives in: House/apartment Stairs: No Has following equipment at home:  none  OCCUPATION: retired   PLOF: Independent  PATIENT GOALS: to be able to walk better   NEXT MD VISIT: not sure   OBJECTIVE:   COGNITION: Overall cognitive status: Within functional limits for tasks assessed   POSTURE: No Significant postural limitations   LOWER EXTREMITY ROM: wfl   LOWER EXTREMITY MMT:  MMT Right eval Left eval  Hip flexion 4/5 4/5  Hip extension 3/5 3/5  Hip abduction 3/5 3/5  Hip adduction    Hip internal rotation    Hip external rotation    Knee flexion 3/5 3/5  Knee extension 3+/5 5/5  Ankle dorsiflexion 4/5 4+/5  Ankle plantarflexion 4/5 5/5  Ankle inversion    Ankle eversion     (Blank rows = not tested)  FUNCTIONAL TESTS:  30 seconds chair stand test:  11 ; 10 is average for age and sex  2 minute walk test: 467ft  decreased ankle motion, forward bent, decreased trunk rotation. Single leg stance:  RT: 2" , LT: 7"    TODAY'S TREATMENT:                                                                                                                              DATE:  05/30/23: Reviewed goals  Educated importance of HEP compliance for maximal  benefits Sit to stand 10x Heel raise 10x Toe raise 10x PWR! Up PWR! [employer]! Twist SLS  Lt 14" Rt 16" max of 3 Hip abduction 10x each Squat front of mat 2x 10 Tandem stance 2x 30 Side step 2RT   05/26/23 Sit to stand x 10 Single leg stance x 3 B Heel to gait     PATIENT EDUCATION:  Education details: HEP Person educated: Patient Education method: Chief Technology Officer Education comprehension: returned demonstration  HOME EXERCISE PROGRAM: Access Code: [email or web address] URL: [email or web address] Date: 05/26/2023 Prepared by: Virgina Organ  Exercises - Sit to Stand  - 2 x daily - 7 x weekly - 1 sets - 10 reps - Standing Single Leg Stance with Counter Support  - 2 x daily - 7 x weekly - 1 sets - 10 reps - as long as possible  hold -heel toe gait  - Heel Toe Raises with Counter Support  - 2 x daily - 7 x weekly - 1 sets - 10 reps - Standing Tandem Balance with Counter Support  - 1 x daily - 7 x weekly - 1 sets - 3 reps - 30" hold - Side Stepping with Counter Support  - 1 x daily - 7 x weekly - 3 sets - 10 reps - Squat with Chair and Counter Support  - 2 x daily - 7 x weekly - 1 sets - 10 reps  ASSESSMENT:  CLINICAL IMPRESSION: 05/30/23:  Reviewed goals and educated importance of HEP compliance for maximal benefits, pt able to recall current exercise program.  Pt presents with improved heel to toe gait mechanics.  Session focus with LE strengthening and balance training.  Incorporated PWR techniques to improve weight distribution and reaching out BOS for balance training with CGA.  PT tolerated well to session with additional exercises added to HEP.  Eval:  Patient is an 82  y.o. female who was seen today for physical therapy evaluation and treatment for abnormal gait and mobility with history of falling.  Evaluation demonstrates decreased balance, decreased strength, decreased activity tolerance and abnormal gait.  Ms. Briskey will benefit from skilled PT to address  these areas and maximize her functional ability as well as decreasing her fall risk.    OBJECTIVE IMPAIRMENTS: Abnormal gait, decreased activity tolerance, decreased balance, difficulty walking, and decreased strength.   ACTIVITY LIMITATIONS: carrying, lifting, standing, squatting, and locomotion level  PARTICIPATION LIMITATIONS: cleaning, shopping, and community activity  REHAB POTENTIAL: Good  CLINICAL DECISION MAKING: Stable/uncomplicated  EVALUATION COMPLEXITY: Moderate   GOALS: Goals reviewed with patient? No  SHORT TERM GOALS: Target date: 06/16/23 Pt to be I in HEP to be able to walk for 15 minutes with confidence.  Baseline: Goal status: IN PROGRESS  2.  Pt to increase her LE mm strength 1/2 grade to be able to walk for 15 minutes with confidence.  Goal status: IN PROGRESS  3.  Pt to be able to single leg stance B x 15 seconds to decrease risk of falling  Baseline:  Goal status: IN PROGRESS  LONG TERM GOALS: Target date: 1025/24  Pt to be I in HEP to be able to walk for 30 minutes with confidence.  Baseline:  Goal status: IN PROGRESS  2.    Pt to increase her LE mm strength 1/2 grade to be able to walk for 30 minutes with confidence Baseline:  Goal status: IN PROGRESS  3. Pt to be able to single leg stance B x 20  seconds to decrease risk of falling Baseline:  Goal status: IN PROGRESS   PLAN:  PT FREQUENCY: 2x/week  PT DURATION:  6 weeks  PLANNED INTERVENTIONS: Therapeutic exercises, Neuromuscular re-education, Balance training, Gait training, Patient/Family education, Self Care, and Manual therapy  PLAN FOR NEXT SESSION: progress strengthening and balance activity, May benefit from POWER activities   Becky Sax, LPTA/CLT; CBIS [phone]  05/30/2023, 9:56 AM

## 2023-06-01 ENCOUNTER — Encounter (HOSPITAL_COMMUNITY): Payer: HMO | Admitting: Physical Therapy

## 2023-06-01 ENCOUNTER — Encounter: Payer: Self-pay | Admitting: Cardiovascular Disease

## 2023-06-01 NOTE — Progress Notes (Signed)
Cardiology Office Note:    Date:  06/02/2023   ID:  [name], DOB 1940/12/21, MRN [id number]  PCP:  Irven Coe, MD  Cardiologist:  Kristeen Miss, MD    Referring MD: Irven Coe, MD   Chief Complaint  Patient presents with   Shortness of Breath        Problem list 1. Shortness of breath with exertion - Asthma  2. Hyperlipidemia 3. Hypertension 4. CVA - on Plavix       [name] is a 82 y.o. female with a hx of DOE Hx of TIA years ago, was started on Plavix at that time Has no energy.   Gets short of breath walking to the mailbox and back  ( 100 yards)  No CP or tightness.    No syncope.    Has had a chronic cough / throat clearing . Had blood work at primary MD Clovis Riley)   Has significant vertebral [name] arterial disease. Dr. Roda Shutters has given her a minimum  BP goal of 120-150  Dr. Ledell Peoples records from [place] reviewed  Sept. 5, 2019:  Seen for DOE and generalized fatigue Went to [facility] pulmonary and was told she had asthma Takes her inhalers.  Went to her primary care for her fatigue - was told to decrease her carbs   Denies any chest pain  Has headaches and has some dizziness ( history of a TIA in the past )  Does not get any regular exercise   Aug. 25, 2020   Has had worsening DOE recently  Similar to last year  Having severe DOE  Has seen pulmonary md in [place]   Complains of chest tightness.  I hear no wheezing on exam today   Oct. 30, 2020   [name] is seen today for follow-up of her severe shortness of breath with exertion.  We performed a right and left heart catheterization on her on March 29, 2019. The mid LAD had a 30% stenosis.  Fed normal left ventricular end-diastolic pressure with an EDP of 6 mmHg.  The left ventricular systolic function was normal with an ejection fraction of 55 to 65%. Right heart pressures were unremarkable.  Mean PA pressure was 40 mmHg.  Pulmonary capillary wedge pressure was 5.  Cardiac output  was 7.3 L/min.  Feeling some better.   Still does not have the breathing capacity .  Has been walking some.   Seems to feel better on the Imdur   February 12, 2020: [name] is seen today for follow-up of her shortness of breath.  We performed a heart catheterization in July, 2020.   Their son is still in the hospital following surgery on his spine.   Lots of complications / infection Stressed but is otherwise doing well from a cardiac standpoint   Sept. 22, 2022 Was just hospitalized for CHF and pneumonia Echocardiogram from May 26, 2021 reveals normal left ventricular systolic function.  She has grade 1 diastolic dysfunction. Mild AI. Was treaded with lasix and leviquin   Feeling a bit better.   Oct. 10, 2023 [name] is seen for follow up of her dyspnea Breathing is going ok   Jan. 30, 2024 [name] is seen for follow up of her dyspnea.  Was admitted to the hospital on Jan. 20 with dehydration following an influenza infection  Was treated for atypical pneumonia ,  was found to also have RSV on 1/21  She was told to follow up with cardiology  Echo shows supranormal LV systolic function, with grade I DD  BNP is normal at 104.8 Troponin levels are 12,12,27,20  C/w increased demand ischemia    She has been since before Christmas  Has had various infections   June, 19, 2024  [name] is seen for follow up of her dyspnea  No cp,  Has chronic dyspnea and DOE  Is very fatigued ,  sleep quite a bit  Has DOE  Difficult to climb to the 3rd floor  Echo from Jan. 2024  Normal LV function   - 70-75% Grade I DD  Mild AI  We had some concerns about whether or not this was an  anginal equivalent  Cath from Sept. 2020 shows mild mid LAD stenosis   She has rales in both bases.  We will restart her Lasix and potassium.  Will also restart Marcelline Deist which will be helpful for her diastolic congestive heart failure and her diabetes.  She is actively wheezing today.  Will start her on  albuterol metered-dose inhaler 2 puffs every 6 hours as needed.   Sept. 20, 2024 [name] is seen for follow up of her dyspnea She was very short of breath when I saw her in June I started her on lasix and potassium   Has seen pulmonary and also ENT    Past Medical History:  Diagnosis Date   Aortic atherosclerosis (HCC)    Asthma    Breast CA (HCC)    Cancer (HCC)    right breast mastectomy   CVA (cerebral vascular accident) (HCC) 04/27/2015   DDD (degenerative disc disease), cervical    Duodenal ulcer    External hemorrhoids    Generalized anxiety disorder    H/O chest pain 03/24/2006   normal nuclear  study   Headache    High cholesterol    Hypertension    Mild aortic insufficiency 03/28/2006   echo   Normal cardiac stress test 2005   Pancreatitis    Personal history of chemotherapy    Personal history of radiation therapy    PONV (postoperative nausea and vomiting)    Sinus bradycardia on ECG    Stroke [facility])     Past Surgical History:  Procedure Laterality Date   ABDOMINAL HYSTERECTOMY     APPENDECTOMY     "30 years ago"   childbirth     x 2   CHOLECYSTECTOMY     "15 years ago"   ESOPHAGOGASTRODUODENOSCOPY N/A 03/04/2014   Procedure: ESOPHAGOGASTRODUODENOSCOPY (EGD);  Surgeon: Theda Belfast, MD;  Location: [facility] ENDOSCOPY;  Service: Endoscopy;  Laterality: N/A;   MASTECTOMY  1993   right breast   RIGHT/LEFT HEART CATH AND CORONARY ANGIOGRAPHY N/A 05/30/2019   Procedure: RIGHT/LEFT HEART CATH AND CORONARY ANGIOGRAPHY;  Surgeon: Corky Crafts, MD;  Location: [facility] INVASIVE CV LAB;  Service: Cardiovascular;  Laterality: N/A;   TUBAL LIGATION      Current Medications: Current Meds  Medication Sig   acetaminophen (TYLENOL) 500 MG tablet Take 500 mg by mouth every 6 (six) hours as needed. As needed for pain   albuterol (VENTOLIN HFA) 108 (90 Base) MCG/ACT inhaler Use 2 puffs by mouth every 4-6 hours as needed for wheezing   amLODipine (NORVASC) 5 MG tablet  Take 5 mg by mouth daily.   atorvastatin (LIPITOR) 80 MG tablet Take 1 tablet (80 mg total) by mouth daily at 6 PM.   azelastine (ASTELIN) 0.1 % nasal spray Place 1 spray into both nostrils 2 (two) times daily.  Use in each nostril as directed   clopidogrel (PLAVIX) 75 MG tablet Take 1 tablet (75 mg total) by mouth daily.   dapagliflozin propanediol (FARXIGA) 10 MG TABS tablet Take 1 tablet (10 mg total) by mouth daily before breakfast.   DULoxetine (CYMBALTA) 30 MG capsule Take 30 mg by mouth daily.   famotidine (PEPCID) 20 MG tablet One after supper   fluticasone (FLONASE) 50 MCG/ACT nasal spray Place 2 sprays into both nostrils daily.   furosemide (LASIX) 40 MG tablet Take 1 tablet (40 mg total) by mouth daily.   isosorbide mononitrate (IMDUR) 30 MG 24 hr tablet TAKE 1 TABLET BY MOUTH DAILY   JARDIANCE 10 MG TABS tablet Take 10 mg by mouth daily.   montelukast (SINGULAIR) 10 MG tablet Take 1 tablet (10 mg total) by mouth at bedtime.   ondansetron (ZOFRAN) 4 MG tablet Take 1 tablet (4 mg total) by mouth every 8 (eight) hours as needed for nausea or vomiting.   pantoprazole (PROTONIX) 40 MG tablet Take 30-60 min before first meal of the day   phenazopyridine (PYRIDIUM) 200 MG tablet Take 1 tablet (200 mg total) by mouth 3 (three) times daily.   predniSONE (DELTASONE) 10 MG tablet Take  4 each am x 2 days,   2 each am x 2 days,  1 each am x 2 days and stop     Allergies:   Oxycodone-acetaminophen, Ciprofloxacin, Codeine, Doxycycline, Percocet [oxycodone-acetaminophen], and Tramadol   Social History   Socioeconomic History   Marital status: Married    Spouse name: Tommy    Number of children: 2   Years of education: 12+   Highest education level: Not on file  Occupational History   Occupation: retired  Tobacco Use   Smoking status: Never   Smokeless tobacco: Never  Vaping Use   Vaping status: Never Used  Substance and Sexual Activity   Alcohol use: No    Alcohol/week: 0.0 standard  drinks of alcohol   Drug use: No   Sexual activity: Not Currently  Other Topics Concern   Not on file  Social History Narrative   Right handed, caffeine 2 cups daily, married, 4 kids, Retired.  [employer] Grad.    Social Determinants of Health   Financial Resource Strain: Not on file  Food Insecurity: Low Risk  (04/19/2023)   Received from [facility]   Hunger Vital Sign    Worried About Running Out of Food in the Last Year: Never true    Ran Out of Food in the Last Year: Never true  Transportation Needs: No Transportation Needs (10/02/2022)   PRAPARE - Administrator, Civil Service (Medical): No    Lack of Transportation (Non-Medical): No  Physical Activity: Not on file  Stress: Not on file  Social Connections: Not on file     Family History: The patient's family history includes Asthma in her mother; Colon cancer (age of onset: 43) in her sister; Heart attack in her father, mother, sister, sister, and sister; Heart disease in her father and mother; Hypertension in her mother, sister, and sister; Lung disease in her daughter; Thyroid disease in her daughter. ROS:   Please see the history of present illness.     All other systems reviewed and are negative.  EKGs/Labs/Other Studies Reviewed:    The following studies were reviewed today:   EKG:         Recent Labs: 10/01/2022: B Natriuretic Peptide 104.8 10/03/2022: ALT 22; Magnesium 2.1; TSH 2.080  03/17/2023: Hemoglobin 13.5; Platelets 268 05/23/2023: BUN 13; Creatinine, Ser 0.77; Potassium 3.9; Sodium 141  Recent Lipid Panel    Component Value Date/Time   CHOL 109 07/12/2019 0925   TRIG 148 07/12/2019 0925   HDL 46 07/12/2019 0925   CHOLHDL 2.4 07/12/2019 0925   CHOLHDL 6.1 04/28/2015 0645   VLDL 60 (H) 04/28/2015 0645   LDLCALC 38 07/12/2019 0925      Physical Exam: Blood pressure 138/86, pulse 87, height 5\' 4"  (1.626 m), weight 146 lb 12.8 oz (66.6 kg), SpO2 96%.      GEN:  Well nourished, well developed in  no acute distress HEENT: Normal NECK: No JVD; No carotid bruits LYMPHATICS: No lymphadenopathy CARDIAC: RRR , no murmurs, rubs, gallops RESPIRATORY:  Clear to auscultation without rales, wheezing or rhonchi  ABDOMEN: Soft, non-tender, non-distended MUSCULOSKELETAL:  No edema; No deformity  SKIN: Warm and dry NEUROLOGIC:  Alert and oriented x 3    ASSESSMENT:    1. Acute on chronic diastolic heart failure (HCC)         PLAN:      1. Acute on chonic diastolic CHF:  Seems to be better  Better after the lasix and potassium  Now has discontinued the lasix .  She is doing much better    2. Hypertension:       BP is well controlled.   3. Hyperlipidemia:   lipids look good . LDL is 46.   4.  DM:         In order of problems listed above: Medication Adjustments/Labs and Tests Ordered: Current medicines are reviewed at length with the patient today.  Concerns regarding medicines are outlined above.  No orders of the defined types were placed in this encounter.   No orders of the defined types were placed in this encounter.  Will see her in a year      Signed, Kristeen Miss, MD  06/02/2023 2:02 PM    [facility]

## 2023-06-02 ENCOUNTER — Ambulatory Visit: Payer: HMO | Attending: Cardiovascular Disease | Admitting: Cardiovascular Disease

## 2023-06-02 ENCOUNTER — Encounter: Payer: Self-pay | Admitting: Cardiovascular Disease

## 2023-06-02 VITALS — BP 138/86 | HR 87 | Ht 64.0 in | Wt 146.8 lb

## 2023-06-02 DIAGNOSIS — I5033 Acute on chronic diastolic (congestive) heart failure: Secondary | ICD-10-CM

## 2023-06-02 NOTE — Patient Instructions (Signed)
Medication Instructions:  Your physician recommends that you continue on your current medications as directed. Please refer to the Current Medication list given to you today.  *If you need a refill on your cardiac medications before your next appointment, please call your pharmacy*   Lab Work: NONE If you have labs (blood work) drawn today and your tests are completely normal, you will receive your results only by: MyChart Message (if you have MyChart) OR A paper copy in the mail If you have any lab test that is abnormal or we need to change your treatment, we will call you to review the results.   Testing/Procedures: NONE   Follow-Up: At [facility], you and your health needs are our priority.  As part of our continuing mission to provide you with exceptional heart care, we have created designated Provider Care Teams.  These Care Teams include your primary Cardiologist (physician) and Advanced Practice Providers (APPs -  Physician Assistants and Nurse Practitioners) who all work together to provide you with the care you need, when you need it.  We recommend signing up for the patient portal called "MyChart".  Sign up information is provided on this After Visit Summary.  MyChart is used to connect with patients for Virtual Visits (Telemedicine).  Patients are able to view lab/test results, encounter notes, upcoming appointments, etc.  Non-urgent messages can be sent to your provider as well.   To learn more about what you can do with MyChart, go to [email or web address].    Your next appointment:   1 year(s)  Provider:   Philip [name], MD      

## 2023-06-05 ENCOUNTER — Encounter (HOSPITAL_COMMUNITY): Payer: Self-pay

## 2023-06-05 ENCOUNTER — Ambulatory Visit (HOSPITAL_COMMUNITY): Payer: HMO

## 2023-06-05 DIAGNOSIS — M6281 Muscle weakness (generalized): Secondary | ICD-10-CM

## 2023-06-05 DIAGNOSIS — R2689 Other abnormalities of gait and mobility: Secondary | ICD-10-CM

## 2023-06-05 DIAGNOSIS — R296 Repeated falls: Secondary | ICD-10-CM

## 2023-06-05 NOTE — Therapy (Signed)
OUTPATIENT PHYSICAL THERAPY LOWER EXTREMITY TREATMENT   Patient Name: [name] MRN: [id number] DOB:December 26, 1940, 82 y.o., female Today's Date: 06/05/2023  END OF SESSION:  PT End of Session - 06/05/23 0928     Visit Number 3    Number of Visits 12    Date for PT Re-Evaluation 07/07/23    Authorization Type [employer]    Progress Note Due on Visit 10    PT Start Time 0843    PT Stop Time 0927    PT Time Calculation (min) 44 min    Activity Tolerance Patient tolerated treatment well    Behavior During Therapy [facility] for tasks assessed/performed               Past Medical History:  Diagnosis Date   Aortic atherosclerosis (HCC)    Asthma    Breast CA (HCC)    Cancer (HCC)    right breast mastectomy   CVA (cerebral vascular accident) (HCC) 04/27/2015   DDD (degenerative disc disease), cervical    Duodenal ulcer    External hemorrhoids    Generalized anxiety disorder    H/O chest pain 03/24/2006   normal nuclear  study   Headache    High cholesterol    Hypertension    Mild aortic insufficiency 03/28/2006   echo   Normal cardiac stress test 2005   Pancreatitis    Personal history of chemotherapy    Personal history of radiation therapy    PONV (postoperative nausea and vomiting)    Sinus bradycardia on ECG    Stroke [facility])    Past Surgical History:  Procedure Laterality Date   ABDOMINAL HYSTERECTOMY     APPENDECTOMY     "30 years ago"   childbirth     x 2   CHOLECYSTECTOMY     "15 years ago"   ESOPHAGOGASTRODUODENOSCOPY N/A 03/04/2014   Procedure: ESOPHAGOGASTRODUODENOSCOPY (EGD);  Surgeon: Theda Belfast, MD;  Location: [facility] ENDOSCOPY;  Service: Endoscopy;  Laterality: N/A;   MASTECTOMY  1993   right breast   RIGHT/LEFT HEART CATH AND CORONARY ANGIOGRAPHY N/A 05/30/2019   Procedure: RIGHT/LEFT HEART CATH AND CORONARY ANGIOGRAPHY;  Surgeon: Corky Crafts, MD;  Location: [facility] INVASIVE CV LAB;  Service: Cardiovascular;  Laterality: N/A;   TUBAL  LIGATION     Patient Active Problem List   Diagnosis Date Noted   Acute respiratory failure with hypoxia (HCC) 10/11/2022   RSV (respiratory syncytial virus pneumonia) 10/03/2022   Fracture of proximal phalanx of lesser toe of right foot 10/02/2022   Chronic diastolic CHF (congestive heart failure) (HCC) 10/02/2022   Lactic acidosis 10/02/2022   Mixed hyperlipidemia due to type 2 diabetes mellitus (HCC) 10/02/2022   Antibiotic reaction, initial encounter 10/02/2022   Abnormal CT of the chest 10/02/2022   Dehydration with hyponatremia 10/01/2022   Type 2 diabetes mellitus with hyperglycemia (HCC) 10/01/2022   Bronchitis 05/25/2021   Asthma exacerbation 05/25/2021   Angina pectoris (HCC)    Arthritis of carpometacarpal (CMC) joint of left thumb 10/03/2017   De Quervain's tenosynovitis, left 10/03/2017   History of stroke 06/13/2017   DOE (dyspnea on exertion) 05/24/2017   Occipital neuralgia of right side 11/02/2016   Palpitations 06/17/2015   Cerebrovascular accident (CVA) due to occlusion of left middle cerebral artery (HCC) 06/17/2015   Hyperlipidemia 06/17/2015   HLD (hyperlipidemia)    Intracranial vascular stenosis    Cerebral infarction due to occlusion of left middle cerebral artery (HCC)    Occipital headache  Extrinsic asthma 07/02/2014   Migraine variant with status migrainosus 07/02/2014   Benign paroxysmal positional vertigo 07/02/2014   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/2014   Essential hypertension 03/03/2014   Mild aortic insufficiency    H/O chest pain    Sinus bradycardia on ECG    GERD without esophagitis 03/24/2008   ADENOCARCINOMA, BREAST, RIGHT 06/30/2007   ASTHMA 06/30/2007   Upper airway cough syndrome 06/30/2007    PCP: Irven Coe  REFERRING PROVIDER: Irven Coe, MD  REFERRING DIAG: R26.89 (ICD-10-CM) - Other abnormalities of gait and mobility  THERAPY DIAG:  Other abnormalities of gait and mobility  Repeated falls  Muscle weakness  (generalized)  Rationale for Evaluation and Treatment: Rehabilitation  ONSET DATE: 6/1/81f4  SUBJECTIVE:   SUBJECTIVE STATEMENT: 06/05/23:  Reports she is feeling good today.  Reports ability to walk around home 5 round trips prior need to rest, plans to increase as able.  Reports she has been looking up and doing heel to toe mechanics, feels safer while walking.  Eval:  Ms. Schmiesing states that it's like she can not pick up her feet. She is much slower walking than she use to be.   She has no energy.  She has fallen three times in the past six months.   She states that she is not having pain anywhere.   PERTINENT HISTORY: As above  PAIN:  Are you having pain? No  PRECAUTIONS: Fall  WEIGHT BEARING RESTRICTIONS: No  FALLS:  Has patient fallen in last 6 months? Yes. Number of falls 3  LIVING ENVIRONMENT: Lives with: lives with their spouse Lives in: House/apartment Stairs: No Has following equipment at home:  none  OCCUPATION: retired   PLOF: Independent  PATIENT GOALS: to be able to walk better   NEXT MD VISIT: not sure   OBJECTIVE:   COGNITION: Overall cognitive status: Within functional limits for tasks assessed   POSTURE: No Significant postural limitations   LOWER EXTREMITY ROM: wfl   LOWER EXTREMITY MMT:  MMT Right eval Left eval  Hip flexion 4/5 4/5  Hip extension 3/5 3/5  Hip abduction 3/5 3/5  Hip adduction    Hip internal rotation    Hip external rotation    Knee flexion 3/5 3/5  Knee extension 3+/5 5/5  Ankle dorsiflexion 4/5 4+/5  Ankle plantarflexion 4/5 5/5  Ankle inversion    Ankle eversion     (Blank rows = not tested)  FUNCTIONAL TESTS:  30 seconds chair stand test:  11 ; 10 is average for age and sex  2 minute walk test: 436ft  decreased ankle motion, forward bent, decreased trunk rotation. Single leg stance:  RT: 2" , LT: 7"    TODAY'S TREATMENT:                                                                                                                               DATE:  06/05/23: PWR! Up 10x PWR! Reach 5x  PWR! Twist 10x Heel raise incline slope10x Toe raise decline slope 10x SLS Lt 15", Rt 11" max of 3 Sidestep with GTB around thigh 3RT Tandem stance 1x 30 on floor, foam x 2 sets x 30" Squat front of chair 10x Vector stance 5" holds BLE  05/30/23: Reviewed goals  Educated importance of HEP compliance for maximal benefits Sit to stand 10x Heel raise 10x Toe raise 10x PWR! Up PWR! [employer]! Twist SLS Lt 14" Rt 16" max of 3 Hip abduction 10x each Squat front of mat 2x 10 Tandem stance 2x 30 Side step 2RT   05/26/23 Sit to stand x 10 Single leg stance x 3 B Heel to gait     PATIENT EDUCATION:  Education details: HEP Person educated: Patient Education method: Chief Technology Officer Education comprehension: returned demonstration  HOME EXERCISE PROGRAM: Access Code: [email or web address] URL: [email or web address] Date: 05/26/2023 Prepared by: Virgina Organ  Exercises - Sit to Stand  - 2 x daily - 7 x weekly - 1 sets - 10 reps - Standing Single Leg Stance with Counter Support  - 2 x daily - 7 x weekly - 1 sets - 10 reps - as long as possible  hold -heel toe gait  - Heel Toe Raises with Counter Support  - 2 x daily - 7 x weekly - 1 sets - 10 reps - Standing Tandem Balance with Counter Support  - 1 x daily - 7 x weekly - 1 sets - 3 reps - 30" hold - Side Stepping with Counter Support  - 1 x daily - 7 x weekly - 3 sets - 10 reps - Squat with Chair and Counter Support  - 2 x daily - 7 x weekly - 1 sets - 10 reps  ASSESSMENT:  CLINICAL IMPRESSION: 06/05/23:  Pt progressing well towards session.  Added theraband resistance for hip strengthening and dynamic surface during balance activities that was a little challenging, required intermittent HHA and cueing to look up and core activation.  No reports of pain through session, did require a few short duration seated rest breaks.  Eval:   Patient is an 82  y.o. female who was seen today for physical therapy evaluation and treatment for abnormal gait and mobility with history of falling.  Evaluation demonstrates decreased balance, decreased strength, decreased activity tolerance and abnormal gait.  Ms. Lazur will benefit from skilled PT to address these areas and maximize her functional ability as well as decreasing her fall risk.    OBJECTIVE IMPAIRMENTS: Abnormal gait, decreased activity tolerance, decreased balance, difficulty walking, and decreased strength.   ACTIVITY LIMITATIONS: carrying, lifting, standing, squatting, and locomotion level  PARTICIPATION LIMITATIONS: cleaning, shopping, and community activity  REHAB POTENTIAL: Good  CLINICAL DECISION MAKING: Stable/uncomplicated  EVALUATION COMPLEXITY: Moderate   GOALS: Goals reviewed with patient? No  SHORT TERM GOALS: Target date: 06/16/23 Pt to be I in HEP to be able to walk for 15 minutes with confidence.  Baseline: Goal status: IN PROGRESS  2.  Pt to increase her LE mm strength 1/2 grade to be able to walk for 15 minutes with confidence.  Goal status: IN PROGRESS  3.  Pt to be able to single leg stance B x 15 seconds to decrease risk of falling  Baseline:  Goal status: IN PROGRESS  LONG TERM GOALS: Target date: 1025/24  Pt to be I in HEP to be able to walk for 30 minutes with confidence.  Baseline:  Goal status: IN PROGRESS  2.  Pt to increase her LE mm strength 1/2 grade to be able to walk for 30 minutes with confidence Baseline:  Goal status: IN PROGRESS  3. Pt to be able to single leg stance B x 20  seconds to decrease risk of falling Baseline:  Goal status: IN PROGRESS   PLAN:  PT FREQUENCY: 2x/week  PT DURATION: 6 weeks  PLANNED INTERVENTIONS: Therapeutic exercises, Neuromuscular re-education, Balance training, Gait training, Patient/Family education, Self Care, and Manual therapy  PLAN FOR NEXT SESSION: progress strengthening and  balance activity, May benefit from POWER activities   Becky Sax, LPTA/CLT; [facility] [phone]  Juel Burrow, PTA 06/05/2023, 9:38 AM  06/05/2023, 9:38 AM

## 2023-06-08 ENCOUNTER — Encounter (HOSPITAL_COMMUNITY): Payer: HMO

## 2023-06-08 ENCOUNTER — Other Ambulatory Visit: Payer: Self-pay | Admitting: Family Medicine

## 2023-06-08 ENCOUNTER — Other Ambulatory Visit (HOSPITAL_COMMUNITY): Payer: Self-pay | Admitting: Occupational Therapy

## 2023-06-08 ENCOUNTER — Ambulatory Visit
Admission: RE | Admit: 2023-06-08 | Discharge: 2023-06-08 | Disposition: A | Discharge status: Home / Self Care | Payer: HMO | Source: Ambulatory Visit | Attending: Family Medicine | Admitting: Family Medicine

## 2023-06-08 ENCOUNTER — Telehealth (HOSPITAL_COMMUNITY): Payer: Self-pay

## 2023-06-08 DIAGNOSIS — R300 Dysuria: Secondary | ICD-10-CM | POA: Diagnosis not present

## 2023-06-08 DIAGNOSIS — R059 Cough, unspecified: Secondary | ICD-10-CM | POA: Diagnosis not present

## 2023-06-08 DIAGNOSIS — J22 Unspecified acute lower respiratory infection: Secondary | ICD-10-CM | POA: Diagnosis not present

## 2023-06-08 DIAGNOSIS — R0989 Other specified symptoms and signs involving the circulatory and respiratory systems: Secondary | ICD-10-CM | POA: Diagnosis not present

## 2023-06-08 DIAGNOSIS — K219 Gastro-esophageal reflux disease without esophagitis: Secondary | ICD-10-CM

## 2023-06-08 NOTE — Telephone Encounter (Signed)
No show, called and spoke to pt who stated she started to get sick around 4:00 this morning and forgot to call. Reminded next apt date and time, encouraged to contact if needs to reschedule/cancel in the future. Pt plans to go to [facility] next week.   Becky Sax, LPTA/CLT; [place] [phone]

## 2023-06-09 DIAGNOSIS — R059 Cough, unspecified: Secondary | ICD-10-CM | POA: Diagnosis not present

## 2023-06-09 DIAGNOSIS — N3000 Acute cystitis without hematuria: Secondary | ICD-10-CM | POA: Diagnosis not present

## 2023-06-12 DIAGNOSIS — R8290 Unspecified abnormal findings in urine: Secondary | ICD-10-CM | POA: Diagnosis not present

## 2023-06-12 DIAGNOSIS — K219 Gastro-esophageal reflux disease without esophagitis: Secondary | ICD-10-CM | POA: Diagnosis not present

## 2023-06-12 DIAGNOSIS — R110 Nausea: Secondary | ICD-10-CM | POA: Diagnosis not present

## 2023-06-14 ENCOUNTER — Emergency Department (HOSPITAL_COMMUNITY)
Admission: EM | Admit: 2023-06-14 | Discharge: 2023-06-14 | Disposition: A | Discharge status: Home / Self Care | Payer: HMO | Attending: Emergency Medicine | Admitting: Emergency Medicine

## 2023-06-14 ENCOUNTER — Ambulatory Visit
Admission: EM | Admit: 2023-06-14 | Discharge: 2023-06-14 | Disposition: A | Discharge status: Home / Self Care | Payer: HMO | Attending: Nurse Practitioner | Admitting: Nurse Practitioner

## 2023-06-14 ENCOUNTER — Emergency Department (HOSPITAL_COMMUNITY): Payer: HMO

## 2023-06-14 ENCOUNTER — Encounter (HOSPITAL_COMMUNITY): Payer: Self-pay | Admitting: *Deleted

## 2023-06-14 ENCOUNTER — Other Ambulatory Visit: Payer: Self-pay

## 2023-06-14 DIAGNOSIS — R519 Headache, unspecified: Secondary | ICD-10-CM

## 2023-06-14 DIAGNOSIS — I10 Essential (primary) hypertension: Secondary | ICD-10-CM | POA: Diagnosis not present

## 2023-06-14 DIAGNOSIS — Z79899 Other long term (current) drug therapy: Secondary | ICD-10-CM | POA: Insufficient documentation

## 2023-06-14 DIAGNOSIS — I672 Cerebral atherosclerosis: Secondary | ICD-10-CM | POA: Diagnosis not present

## 2023-06-14 DIAGNOSIS — G43909 Migraine, unspecified, not intractable, without status migrainosus: Secondary | ICD-10-CM | POA: Diagnosis not present

## 2023-06-14 DIAGNOSIS — I6782 Cerebral ischemia: Secondary | ICD-10-CM | POA: Diagnosis not present

## 2023-06-14 LAB — CBC WITH DIFFERENTIAL/PLATELET
Abs Immature Granulocytes: 0.02 10*3/uL (ref 0.00–0.07)
Basophils Absolute: 0.1 10*3/uL (ref 0.0–0.1)
Basophils Relative: 1 %
Eosinophils Absolute: 0.2 10*3/uL (ref 0.0–0.5)
Eosinophils Relative: 3 %
HCT: 40.2 % (ref 36.0–46.0)
Hemoglobin: 13.4 g/dL (ref 12.0–15.0)
Immature Granulocytes: 0 %
Lymphocytes Relative: 28 %
Lymphs Abs: 2.1 10*3/uL (ref 0.7–4.0)
MCH: 30.5 pg (ref 26.0–34.0)
MCHC: 33.3 g/dL (ref 30.0–36.0)
MCV: 91.4 fL (ref 80.0–100.0)
Monocytes Absolute: 0.5 10*3/uL (ref 0.1–1.0)
Monocytes Relative: 7 %
Neutro Abs: 4.4 10*3/uL (ref 1.7–7.7)
Neutrophils Relative %: 61 %
Platelets: 237 10*3/uL (ref 150–400)
RBC: 4.4 MIL/uL (ref 3.87–5.11)
RDW: 13.2 % (ref 11.5–15.5)
WBC: 7.2 10*3/uL (ref 4.0–10.5)
nRBC: 0 % (ref 0.0–0.2)

## 2023-06-14 LAB — BASIC METABOLIC PANEL
Anion gap: 11 (ref 5–15)
BUN: 14 mg/dL (ref 8–23)
CO2: 25 mmol/L (ref 22–32)
Calcium: 9.5 mg/dL (ref 8.9–10.3)
Chloride: 103 mmol/L (ref 98–111)
Creatinine, Ser: 0.87 mg/dL (ref 0.44–1.00)
GFR, Estimated: >60 mL/min (ref >60)
Glucose, Bld: 125 mg/dL — ABNORMAL HIGH (ref 70–99)
Potassium: 3.7 mmol/L (ref 3.5–5.1)
Sodium: 139 mmol/L (ref 135–145)

## 2023-06-14 LAB — POCT FASTING CBG KUC MANUAL ENTRY: POCT Glucose (KUC): 150 mg/dL — AB (ref 70–99)

## 2023-06-14 MED ORDER — DEXAMETHASONE SODIUM PHOSPHATE 10 MG/ML IJ SOLN
10.0000 mg | Freq: Once | INTRAMUSCULAR | Status: AC
  Administered 2023-06-14: 10 mg via INTRAVENOUS
  Filled 2023-06-14: qty 1

## 2023-06-14 MED ORDER — METOCLOPRAMIDE HCL 5 MG/ML IJ SOLN
10.0000 mg | Freq: Once | INTRAMUSCULAR | Status: AC
  Administered 2023-06-14: 10 mg via INTRAVENOUS
  Filled 2023-06-14: qty 2

## 2023-06-14 MED ORDER — SODIUM CHLORIDE 0.9 % IV BOLUS
1000.0000 mL | Freq: Once | INTRAVENOUS | Status: AC
  Administered 2023-06-14: 1000 mL via INTRAVENOUS

## 2023-06-14 NOTE — ED Triage Notes (Signed)
Pt c/o migraine, head starts to hurt so bad she feels nauseas and need to throw up, sensitivity to light, has had going on 3 days, has tried Excedrin migraine helps for a while wears off and headache is back.

## 2023-06-14 NOTE — ED Provider Notes (Signed)
[name]   CSN: [id number] Arrival date & time: 06/14/23  1513     History  Chief Complaint  Patient presents with   Headache    [name] is a 82 y.o. female.  HPI Patient presents with her daughter and her husband. She arrives from urgent care where she was sent here due to concern for headache. Patient awake, alert, sitting upright in the bed, eating a bag of ranch Doritos.  She notes that for the past 6 or 7 days she has had headache, severe, with associated nausea, vomiting.  She was initially diagnosed with urinary tract infection as well, but on follow-up 2 days ago was notified that her urinalysis was unremarkable and is not currently taking any antibiotics. She may have a history of migraine headaches. Headaches are diffuse left-sided, with associated photophobia, currently minimal.    Home Medications Prior to Admission medications   Medication Sig Start Date End Date Taking? Authorizing Provider  acetaminophen (TYLENOL) 500 MG tablet Take 500 mg by mouth every 6 (six) hours as needed. As needed for pain    [provider]  albuterol (VENTOLIN HFA) 108 (90 Base) MCG/ACT inhaler Use 2 puffs by mouth every 4-6 hours as needed for wheezing 03/01/23   [name], Deloris Ping, MD  amLODipine (NORVASC) 5 MG tablet Take 5 mg by mouth daily.    [provider]  atorvastatin (LIPITOR) 80 MG tablet Take 1 tablet (80 mg total) by mouth daily at 6 PM. 07/06/15   Marvel Plan, MD  azelastine (ASTELIN) 0.1 % nasal spray Place 1 spray into both nostrils 2 (two) times daily. Use in each nostril as directed 09/15/22   Particia Nearing, PA-C  clopidogrel (PLAVIX) 75 MG tablet Take 1 tablet (75 mg total) by mouth daily. 06/01/17   Marvel Plan, MD  dapagliflozin propanediol (FARXIGA) 10 MG TABS tablet Take 1 tablet (10 mg total) by mouth daily before breakfast. 03/01/23   [name], Deloris Ping, MD  DULoxetine (CYMBALTA)  30 MG capsule Take 30 mg by mouth daily. 05/05/21   [provider]  famotidine (PEPCID) 20 MG tablet One after supper 12/23/22   Nyoka Cowden, MD  fluticasone [facility]) 50 MCG/ACT nasal spray Place 2 sprays into both nostrils daily. 04/25/23   Leath-Warren, Sadie Haber, NP  furosemide (LASIX) 40 MG tablet Take 1 tablet (40 mg total) by mouth daily. 03/01/23   [name], Deloris Ping, MD  isosorbide mononitrate (IMDUR) 30 MG 24 hr tablet TAKE 1 TABLET BY MOUTH DAILY 07/07/22   [name], Deloris Ping, MD  JARDIANCE 10 MG TABS tablet Take 10 mg by mouth daily. 03/03/23   [provider]  metFORMIN (GLUCOPHAGE) 500 MG tablet Take 1 tablet (500 mg total) by mouth daily with breakfast. 10/05/22 12/23/22  Lanae Boast, MD  montelukast (SINGULAIR) 10 MG tablet Take 1 tablet (10 mg total) by mouth at bedtime. 05/29/21   Leroy Sea, MD  ondansetron (ZOFRAN) 4 MG tablet Take 1 tablet (4 mg total) by mouth every 8 (eight) hours as needed for nausea or vomiting. 02/01/22   [name], MD  pantoprazole (PROTONIX) 40 MG tablet Take 30-60 min before first meal of the day 12/23/22   Nyoka Cowden, MD  phenazopyridine (PYRIDIUM) 200 MG tablet Take 1 tablet (200 mg total) by mouth 3 (three) times daily. 01/22/23   Linwood Dibbles, MD  potassium chloride (KLOR-CON) 10 MEQ tablet Take 1 tablet (10  mEq total) by mouth 2 (two) times daily. Patient not taking: Reported on 06/02/2023 03/01/23   [name], Deloris Ping, MD  predniSONE (DELTASONE) 10 MG tablet Take  4 each am x 2 days,   2 each am x 2 days,  1 each am x 2 days and stop 03/17/23   Nyoka Cowden, MD      Allergies    Oxycodone-acetaminophen, Ciprofloxacin, Codeine, Doxycycline, Percocet [oxycodone-acetaminophen], and Tramadol    Review of Systems   Review of Systems  All other systems reviewed and are negative.   Physical Exam Updated Vital Signs BP (!) 174/59 (BP Location: Left Arm)   Pulse 66   Temp 98.3 F (36.8 C) (Oral)   Resp 16   Ht 5\' 4"  (1.626  m)   Wt 65.8 kg   SpO2 94%   BMI 24.89 kg/m  Physical Exam Vitals and nursing [name] reviewed.  Constitutional:      General: She is not in acute distress.    Appearance: She is well-developed.  HENT:     Head: Normocephalic and atraumatic.  Eyes:     Conjunctiva/sclera: Conjunctivae normal.  Pulmonary:     Effort: Pulmonary effort is normal. No respiratory distress.     Breath sounds: No stridor.  Abdominal:     General: There is no distension.  Skin:    General: Skin is warm and dry.  Neurological:     Mental Status: She is alert and oriented to person, place, and time.     Cranial Nerves: No cranial nerve deficit.  Psychiatric:        Mood and Affect: Mood normal.     ED Results / Procedures / Treatments   Labs (all labs ordered are listed, but only abnormal results are displayed) Labs Reviewed  BASIC METABOLIC PANEL - Abnormal; Notable for the following components:      Result Value   Glucose, Bld 125 (*)    All other components within normal limits  CBC WITH DIFFERENTIAL/PLATELET    EKG None  Radiology CT Head Wo Contrast  Result Date: 06/14/2023 CLINICAL DATA:  Severe headache for 6 days. EXAM: CT HEAD WITHOUT CONTRAST TECHNIQUE: Contiguous axial images were obtained from the base of the skull through the vertex without intravenous contrast. RADIATION DOSE REDUCTION: This exam was performed according to the departmental dose-optimization program which includes automated exposure control, adjustment of the mA and/or kV according to patient size and/or use of iterative reconstruction technique. COMPARISON:  CT 05/05/2019 FINDINGS: Brain: No intracranial hemorrhage, mass effect, or evidence of acute infarct. No hydrocephalus. No extra-axial fluid collection. Advanced cerebral atrophy and chronic small vessel ischemic disease. Vascular: No hyperdense vessel. Intracranial arterial calcification. Skull: No fracture or focal lesion. Sinuses/Orbits: No acute finding. Other:  None. IMPRESSION: 1. No acute intracranial abnormality. Electronically Signed   By: Minerva Fester M.D.   On: 06/14/2023 20:07    Procedures Procedures    Medications Ordered in ED Medications  sodium chloride 0.9 % bolus 1,000 mL (1,000 mLs Intravenous New Bag/Given 06/14/23 2026)  metoCLOPramide (REGLAN) injection 10 mg (10 mg Intravenous Given 06/14/23 2027)  dexamethasone (DECADRON) injection 10 mg (10 mg Intravenous Given 06/14/23 2028)    ED Course/ Medical Decision Making/ A&P                                 Medical Decision Making Adult female history of migraines per chart review presents  emergent care with concern for severe headache.  She is awake, alert, neurologically without deficits, is hemodynamically unremarkable aside for mild hypertension. Some suspicion for migraine given the waxing, waning severity, with associated nausea, vomiting, no photophobia.  Absence of neurodeficits reassuring, given the duration, age, CT ordered, labs ordered. Patient received fluids Reglan steroids. Cardiac 60 sinus normal Pulse ox 96% room air normal   Amount and/or Complexity of Data Reviewed Independent Historian:     Details: Daughter at bedside External Data Reviewed: notes.    Details: Urgent care [name] from today reviewed Labs: ordered. Decision-making details documented in ED Course. Radiology: ordered and independent interpretation performed. Decision-making details documented in ED Course.  Risk Prescription drug management. Decision regarding hospitalization. Diagnosis or treatment significantly limited by social determinants of health.   Update: Patient in no distress on repeat exam awake, alert, no new complaints headache is improved vitals unremarkable, labs unremarkable, CT reviewed unremarkable, patient appropriate for outpatient follow-up with primary care.        Final Clinical Impression(s) / ED Diagnoses Final diagnoses:  Bad headache    Rx / DC  Orders ED Discharge Orders     None         Gerhard Munch, MD 06/14/23 2044

## 2023-06-14 NOTE — ED Provider Notes (Signed)
[facility]    CSN: [id number] Arrival date & time: 06/14/23  1340      History   Chief Complaint No chief complaint on file.   HPI [name] is a 82 y.o. female.   The history is provided by the patient.   Patient presents for complaints of headache, nausea, and sound sensitivity.  Patient states symptoms have been present for the past 3 days.  Patient also complains of blurred vision.  Patient states "this migraine is terrible".  Patient does not recall history of migraines, but states that her daughters do have a history.  Per review of her chart, patient was diagnosed with migraines in 2015.  Patient reports she was recently discontinued from 2 antibiotics due to side effects of nausea and vomiting.  She states that she has not had vomiting for the past 3 days.  She states she has been taking Excedrin migraine as instructed by her PCP.  She states she is able to sleep at night, when she wakes up, headache is present.  Patient further denies slurred speech, dizziness, lightheadedness, chest pain, abdominal pain, lower extremity weakness, numbness or tingling of lower extremities.  Patient reports she has been taking Zofran for her symptoms, she was recently provided a prescription when she saw her doctor on 06/12/2023.   Patient Active Problem List   Diagnosis Date Noted   Acute respiratory failure with hypoxia (HCC) 10/11/2022   RSV (respiratory syncytial virus pneumonia) 10/03/2022   Fracture of proximal phalanx of lesser toe of right foot 10/02/2022   Chronic diastolic CHF (congestive heart failure) (HCC) 10/02/2022   Lactic acidosis 10/02/2022   Mixed hyperlipidemia due to type 2 diabetes mellitus (HCC) 10/02/2022   Antibiotic reaction, initial encounter 10/02/2022   Abnormal CT of the chest 10/02/2022   Dehydration with hyponatremia 10/01/2022   Type 2 diabetes mellitus with hyperglycemia (HCC) 10/01/2022   Bronchitis 05/25/2021   Asthma exacerbation  05/25/2021   Angina pectoris (HCC)    Arthritis of carpometacarpal (CMC) joint of left thumb 10/03/2017   De Quervain's tenosynovitis, left 10/03/2017   History of stroke 06/13/2017   DOE (dyspnea on exertion) 05/24/2017   Occipital neuralgia of right side 11/02/2016   Palpitations 06/17/2015   Cerebrovascular accident (CVA) due to occlusion of left middle cerebral artery (HCC) 06/17/2015   Hyperlipidemia 06/17/2015   HLD (hyperlipidemia)    Intracranial vascular stenosis    Cerebral infarction due to occlusion of left middle cerebral artery (HCC)    Occipital headache    Extrinsic asthma 07/02/2014   Migraine variant with status migrainosus 07/02/2014   Benign paroxysmal positional vertigo 07/02/2014   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/2014   Essential hypertension 03/03/2014   Mild aortic insufficiency    H/O chest pain    Sinus bradycardia on ECG    GERD without esophagitis 03/24/2008   ADENOCARCINOMA, BREAST, RIGHT 06/30/2007   ASTHMA 06/30/2007   Upper airway cough syndrome 06/30/2007    Past Surgical History:  Procedure Laterality Date   ABDOMINAL HYSTERECTOMY     APPENDECTOMY     "30 years ago"   childbirth     x 2   CHOLECYSTECTOMY     "15 years ago"   ESOPHAGOGASTRODUODENOSCOPY N/A 03/04/2014   Procedure: ESOPHAGOGASTRODUODENOSCOPY (EGD);  Surgeon: Theda Belfast, MD;  Location: [facility] ENDOSCOPY;  Service: Endoscopy;  Laterality: N/A;   MASTECTOMY  1993   right breast   RIGHT/LEFT HEART CATH AND CORONARY ANGIOGRAPHY N/A 05/30/2019   Procedure: RIGHT/LEFT  HEART CATH AND CORONARY ANGIOGRAPHY;  Surgeon: Corky Crafts, MD;  Location: [facility] INVASIVE CV LAB;  Service: Cardiovascular;  Laterality: N/A;   TUBAL LIGATION      OB History     Gravida  2   Para  2   Term      Preterm      AB      Living  2      SAB      IAB      Ectopic      Multiple      Live Births               Home Medications    Prior to Admission medications    Medication Sig Start Date End Date Taking? Authorizing Provider  acetaminophen (TYLENOL) 500 MG tablet Take 500 mg by mouth every 6 (six) hours as needed. As needed for pain    [provider]  albuterol (VENTOLIN HFA) 108 (90 Base) MCG/ACT inhaler Use 2 puffs by mouth every 4-6 hours as needed for wheezing 03/01/23   [name], Deloris Ping, MD  amLODipine (NORVASC) 5 MG tablet Take 5 mg by mouth daily.    [provider]  atorvastatin (LIPITOR) 80 MG tablet Take 1 tablet (80 mg total) by mouth daily at 6 PM. 07/06/15   Marvel Plan, MD  azelastine (ASTELIN) 0.1 % nasal spray Place 1 spray into both nostrils 2 (two) times daily. Use in each nostril as directed 09/15/22   Particia Nearing, PA-C  clopidogrel (PLAVIX) 75 MG tablet Take 1 tablet (75 mg total) by mouth daily. 06/01/17   Marvel Plan, MD  dapagliflozin propanediol (FARXIGA) 10 MG TABS tablet Take 1 tablet (10 mg total) by mouth daily before breakfast. 03/01/23   [name], Deloris Ping, MD  DULoxetine (CYMBALTA) 30 MG capsule Take 30 mg by mouth daily. 05/05/21   [provider]  famotidine (PEPCID) 20 MG tablet One after supper 12/23/22   Nyoka Cowden, MD  fluticasone [facility]) 50 MCG/ACT nasal spray Place 2 sprays into both nostrils daily. 04/25/23   Haizlee Henton-Warren, Sadie Haber, NP  furosemide (LASIX) 40 MG tablet Take 1 tablet (40 mg total) by mouth daily. 03/01/23   [name], Deloris Ping, MD  isosorbide mononitrate (IMDUR) 30 MG 24 hr tablet TAKE 1 TABLET BY MOUTH DAILY 07/07/22   [name], Deloris Ping, MD  JARDIANCE 10 MG TABS tablet Take 10 mg by mouth daily. 03/03/23   [provider]  metFORMIN (GLUCOPHAGE) 500 MG tablet Take 1 tablet (500 mg total) by mouth daily with breakfast. 10/05/22 12/23/22  Lanae Boast, MD  montelukast (SINGULAIR) 10 MG tablet Take 1 tablet (10 mg total) by mouth at bedtime. 05/29/21   Leroy Sea, MD  ondansetron (ZOFRAN) 4 MG tablet Take 1 tablet (4 mg total) by mouth every 8 (eight) hours as  needed for nausea or vomiting. 02/01/22   [name], MD  pantoprazole (PROTONIX) 40 MG tablet Take 30-60 min before first meal of the day 12/23/22   Nyoka Cowden, MD  phenazopyridine (PYRIDIUM) 200 MG tablet Take 1 tablet (200 mg total) by mouth 3 (three) times daily. 01/22/23   Linwood Dibbles, MD  potassium chloride (KLOR-CON) 10 MEQ tablet Take 1 tablet (10 mEq total) by mouth 2 (two) times daily. Patient not taking: Reported on 06/02/2023 03/01/23   [name], Deloris Ping, MD  predniSONE (DELTASONE) 10 MG tablet Take  4 each am x 2 days,   2 each  am x 2 days,  1 each am x 2 days and stop 03/17/23   Nyoka Cowden, MD    Family History Family History  Problem Relation Age of Onset   Heart attack Mother    Heart disease Mother    Hypertension Mother    Asthma Mother    Heart attack Father    Heart disease Father    Hypertension Sister    Heart attack Sister    Colon cancer Sister 34   Hypertension Sister    Heart attack Sister    Heart attack Sister    Lung disease Daughter    Thyroid disease Daughter     Social History Social History   Tobacco Use   Smoking status: Never   Smokeless tobacco: Never  Vaping Use   Vaping status: Never Used  Substance Use Topics   Alcohol use: No    Alcohol/week: 0.0 standard drinks of alcohol   Drug use: No     Allergies   Oxycodone-acetaminophen, Ciprofloxacin, Codeine, Doxycycline, Percocet [oxycodone-acetaminophen], and Tramadol   Review of Systems Review of Systems Per HPI  Physical Exam Triage Vital Signs ED Triage Vitals [06/14/23 1353]  Encounter Vitals Group     BP (!) 151/85     Systolic BP Percentile      Diastolic BP Percentile      Pulse Rate 60     Resp 15     Temp 98.4 F (36.9 C)     Temp Source Oral     SpO2 94 %     Weight      Height      Head Circumference      Peak Flow      Pain Score 9     Pain Loc      Pain Education      Exclude from Growth Chart    No data found.  Updated Vital Signs BP (!)  151/85 (BP Location: Right Arm)   Pulse 60   Temp 98.4 F (36.9 C) (Oral)   Resp 15   SpO2 94%   Visual Acuity Right Eye Distance:   Left Eye Distance:   Bilateral Distance:    Right Eye Near:   Left Eye Near:    Bilateral Near:     Physical Exam Vitals and nursing note reviewed.  Constitutional:      General: She is not in acute distress.    Appearance: Normal appearance.  HENT:     Head: Normocephalic.     Right Ear: Tympanic membrane, ear canal and external ear normal.     Left Ear: Tympanic membrane, ear canal and external ear normal.     Nose: Nose normal.     Mouth/Throat:     Mouth: Mucous membranes are moist.  Eyes:     Extraocular Movements: Extraocular movements intact.     Conjunctiva/sclera: Conjunctivae normal.     Pupils: Pupils are equal, round, and reactive to light.  Cardiovascular:     Rate and Rhythm: Normal rate and regular rhythm.     Pulses: Normal pulses.     Heart sounds: Normal heart sounds.  Pulmonary:     Effort: Pulmonary effort is normal.     Breath sounds: Normal breath sounds.  Abdominal:     General: Bowel sounds are normal.     Palpations: Abdomen is soft.     Tenderness: There is no abdominal tenderness.  Musculoskeletal:     Cervical back: Normal range of motion.  Skin:    General: Skin is warm and dry.  Neurological:     General: No focal deficit present.     Mental Status: She is alert and oriented to person, place, and time.     GCS: GCS eye subscore is 4. GCS verbal subscore is 5. GCS motor subscore is 6.     Cranial Nerves: Cranial nerves 2-12 are intact.     Sensory: Sensation is intact.     Motor: Motor function is intact.     Coordination: Coordination is intact.     Gait: Gait is intact.  Psychiatric:        Mood and Affect: Mood normal.        Behavior: Behavior normal.      UC Treatments / Results  Labs (all labs ordered are listed, but only abnormal results are displayed) Labs Reviewed  POCT FASTING CBG  [facility] MANUAL ENTRY - Abnormal; Notable for the following components:      Result Value   POCT Glucose ([facility]) 150 (*)    All other components within normal limits    EKG   Radiology No results found.  Procedures Procedures (including critical care time)  Medications Ordered in UC Medications - No data to display  Initial Impression / Assessment and Plan / UC Course  I have reviewed the triage vital signs and the nursing notes.  Pertinent labs & imaging results that were available during my care of the patient were reviewed by me and considered in my medical decision making (see chart for details).  The patient is well-appearing, she is in no acute distress, vital signs are stable.  Patient presents for complaints of an occipital headache for the past 3 days.  Patient also complains of light sensitivity and intermittent blurred vision.  Patient reports that she has had nausea, but was recently taken off of 2 antibiotics due to side effects.  Last vomiting was approximately 3 days ago.  Patient with history of CVA, she also has migraine variant on her problem list.  Fingerstick blood glucose-150, most recent hemoglobin A1c available was performed in January, 10.4.  Patient reports she has since been started on Jardiance.  On exam, patient did not display any neurological deficits. Given patient's persistent headache, despite use of over-the-counter medications, her age, and past medical history, would like patient to be seen in the emergency department for further evaluation given her current symptoms.  Discussed same with patient, and she is in agreement with this plan of care.  Patient's vital signs are stable at this time.  Patient ambulatory at discharge.  Discharged to the emergency department for further evaluation.  Final Clinical Impressions(s) / UC Diagnoses   Final diagnoses:  Occipital headache     Discharge Instructions      Go to the emergency department for further  evaluation.     ED Prescriptions   None    PDMP not reviewed this encounter.   Abran Cantor, NP 06/14/23 1523

## 2023-06-14 NOTE — Discharge Instructions (Signed)
Go to the emergency department for further evaluation

## 2023-06-14 NOTE — ED Notes (Signed)
Patient is being discharged from the Urgent Care and sent to the Emergency Department via POV . Per NP, patient is in need of higher level of care due to headache with nausea/light sensitivity. Patient is aware and verbalizes understanding of plan of care.  Vitals:   06/14/23 1353  BP: (!) 151/85  Pulse: 60  Resp: 15  Temp: 98.4 F (36.9 C)  SpO2: 94%

## 2023-06-14 NOTE — ED Provider Triage Note (Signed)
Emergency Medicine Provider Triage Evaluation Note  [name] , a 82 y.o. female  was evaluated in triage.  Pt complains of headache, n/v x 1 week, denies focal weakness, no fevers,  has h/o of cva with dysarthria but no sequelae.  On plavix.  Review of Systems  Positive: Headache, n/v,  Negative: Weakness, abd pain, cp, sob, palpitations  Physical Exam  BP (!) 163/74 (BP Location: Left Arm)   Pulse (!) 58   Temp 98.8 F (37.1 C) (Oral)   Resp 18   Ht 5\' 4"  (1.626 m)   Wt 65.8 kg   SpO2 95%   BMI 24.89 kg/m  Gen:   Awake, no distress   Resp:  Normal effort  MSK:   Moves extremities without difficulty  Other:    Medical Decision Making  Medically screening exam initiated at 5:01 PM.  Appropriate orders placed.  [name] was informed that the remainder of the evaluation will be completed by another provider, this initial triage assessment does not replace that evaluation, and the importance of remaining in the ED until their evaluation is complete.     Burgess Amor, PA-C 06/14/23 1703

## 2023-06-14 NOTE — Discharge Instructions (Signed)
As discussed, your evaluation today has been largely reassuring.  But, it is important that you monitor your condition carefully, and do not hesitate to return to the ED if you develop new, or concerning changes in your condition. ? ?Otherwise, please follow-up with your physician for appropriate ongoing care. ? ?

## 2023-06-14 NOTE — ED Triage Notes (Signed)
Pt sent from UC for headache and for CT head. + N/V, denies any weakness or numbness.

## 2023-06-14 NOTE — ED Notes (Signed)
Pt reports last ate approx half a sandwich a few hours ago.

## 2023-06-19 ENCOUNTER — Encounter (HOSPITAL_COMMUNITY): Payer: HMO

## 2023-06-19 ENCOUNTER — Telehealth (HOSPITAL_COMMUNITY): Payer: Self-pay

## 2023-06-19 NOTE — Telephone Encounter (Signed)
No show #2, called and spoke to pt who thought the apt was scheduled for 06/20/23. Reminded next apt date and time and educated on no show policy details.   Becky Sax, LPTA/CLT; [place] [phone]

## 2023-06-21 ENCOUNTER — Encounter (HOSPITAL_COMMUNITY): Payer: HMO

## 2023-06-21 ENCOUNTER — Telehealth (HOSPITAL_COMMUNITY): Payer: Self-pay

## 2023-06-21 NOTE — Telephone Encounter (Signed)
No show #3. Called and spoke to pt who thought this apt was cancelled. Reviewed no show policy and encouraged pt to contact MD if wishes to return to therapy.   Becky Sax, LPTA/CLT; [facility] [phone]  Juel Burrow, PTA 06/21/2023, 8:21 AM

## 2023-06-27 ENCOUNTER — Encounter (HOSPITAL_COMMUNITY): Payer: HMO | Admitting: Physical Therapy

## 2023-06-29 ENCOUNTER — Encounter (HOSPITAL_COMMUNITY): Payer: HMO | Admitting: Physical Therapy

## 2023-07-04 ENCOUNTER — Encounter (HOSPITAL_COMMUNITY): Payer: HMO

## 2023-07-04 ENCOUNTER — Encounter: Payer: Self-pay | Admitting: Orthopedic Surgery

## 2023-07-04 ENCOUNTER — Ambulatory Visit (INDEPENDENT_AMBULATORY_CARE_PROVIDER_SITE_OTHER): Payer: HMO | Admitting: Orthopedic Surgery

## 2023-07-04 ENCOUNTER — Other Ambulatory Visit (INDEPENDENT_AMBULATORY_CARE_PROVIDER_SITE_OTHER): Payer: Self-pay

## 2023-07-04 VITALS — BP 132/74 | HR 99 | Ht 64.0 in | Wt 145.0 lb

## 2023-07-04 DIAGNOSIS — M79674 Pain in right toe(s): Secondary | ICD-10-CM

## 2023-07-04 DIAGNOSIS — S92514A Nondisplaced fracture of proximal phalanx of right lesser toe(s), initial encounter for closed fracture: Secondary | ICD-10-CM | POA: Diagnosis not present

## 2023-07-04 NOTE — Progress Notes (Signed)
New Patient Visit  Assessment: [name] is a 82 y.o. female with the following: 1. Closed nondisplaced fracture of proximal phalanx of lesser toe of right foot, initial encounter   Plan: [name] stubbed the toe on her right foot.  She had immediate pain.  She continues to have difficulty ambulating.  Radiographs today demonstrates minimally displaced fracture of the proximal phalanx to the right fourth toe.  She does have some associated swelling and mild bruising.  Toes were buddy taped in clinic today.  She was provided with a postop shoe.  She can bear weight through her heel.  She is going to a family wedding this weekend, and I think it is okay for her to wear what ever shoes she wants for this event.  Otherwise, I will see her back in 2 weeks.  Follow-up: Return in about 3 weeks (around 07/25/2023).  Subjective:  Chief Complaint  Patient presents with   Toe Injury    Right 4th toe vs corner of a chair yesterday     History of Present Illness: [name] is a 82 y.o. female who presents for evaluation of right toe pain.  She states that she got up from a seated position yesterday, and stubbed her toe.  She had immediate pain.  She is able to ambulate.  She continues to have difficulty.  It hurts to put pressure on that side of her foot.   Review of Systems: No fevers or chills No numbness or tingling No chest pain No shortness of breath No bowel or bladder dysfunction No GI distress No headaches   Medical History:  Past Medical History:  Diagnosis Date   Aortic atherosclerosis (HCC)    Asthma    Breast CA (HCC)    Cancer (HCC)    right breast mastectomy   CVA (cerebral vascular accident) (HCC) 04/27/2015   DDD (degenerative disc disease), cervical    Duodenal ulcer    External hemorrhoids    Generalized anxiety disorder    H/O chest pain 03/24/2006   normal nuclear  study   Headache    High cholesterol    Hypertension    Mild aortic  insufficiency 03/28/2006   echo   Normal cardiac stress test 2005   Pancreatitis    Personal history of chemotherapy    Personal history of radiation therapy    PONV (postoperative nausea and vomiting)    Sinus bradycardia on ECG    Stroke [facility])     Past Surgical History:  Procedure Laterality Date   ABDOMINAL HYSTERECTOMY     APPENDECTOMY     "30 years ago"   childbirth     x 2   CHOLECYSTECTOMY     "15 years ago"   ESOPHAGOGASTRODUODENOSCOPY N/A 03/04/2014   Procedure: ESOPHAGOGASTRODUODENOSCOPY (EGD);  Surgeon: Theda Belfast, MD;  Location: [facility] ENDOSCOPY;  Service: Endoscopy;  Laterality: N/A;   MASTECTOMY  1993   right breast   RIGHT/LEFT HEART CATH AND CORONARY ANGIOGRAPHY N/A 05/30/2019   Procedure: RIGHT/LEFT HEART CATH AND CORONARY ANGIOGRAPHY;  Surgeon: Corky Crafts, MD;  Location: [facility] INVASIVE CV LAB;  Service: Cardiovascular;  Laterality: N/A;   TUBAL LIGATION      Family History  Problem Relation Age of Onset   Heart attack Mother    Heart disease Mother    Hypertension Mother    Asthma Mother    Heart attack Father    Heart disease Father    Hypertension Sister  Heart attack Sister    Colon cancer Sister 47   Hypertension Sister    Heart attack Sister    Heart attack Sister    Lung disease Daughter    Thyroid disease Daughter    Social History   Tobacco Use   Smoking status: Never   Smokeless tobacco: Never  Vaping Use   Vaping status: Never Used  Substance Use Topics   Alcohol use: No    Alcohol/week: 0.0 standard drinks of alcohol   Drug use: No    Allergies  Allergen Reactions   Oxycodone-Acetaminophen Nausea And Vomiting   Ciprofloxacin Nausea And Vomiting    Other reaction(s): Other (See Comments)   Codeine Nausea And Vomiting   Doxycycline Nausea And Vomiting    REACTION: GI upset   Percocet [Oxycodone-Acetaminophen] Nausea And Vomiting   Tramadol Nausea And Vomiting    Other reaction(s): Other (See Comments)     Current Meds  Medication Sig   acetaminophen (TYLENOL) 500 MG tablet Take 500 mg by mouth every 6 (six) hours as needed. As needed for pain   albuterol (VENTOLIN HFA) 108 (90 Base) MCG/ACT inhaler Use 2 puffs by mouth every 4-6 hours as needed for wheezing   amLODipine (NORVASC) 5 MG tablet Take 5 mg by mouth daily.   atorvastatin (LIPITOR) 80 MG tablet Take 1 tablet (80 mg total) by mouth daily at 6 PM.   azelastine (ASTELIN) 0.1 % nasal spray Place 1 spray into both nostrils 2 (two) times daily. Use in each nostril as directed   clopidogrel (PLAVIX) 75 MG tablet Take 1 tablet (75 mg total) by mouth daily.   dapagliflozin propanediol (FARXIGA) 10 MG TABS tablet Take 1 tablet (10 mg total) by mouth daily before breakfast.   DULoxetine (CYMBALTA) 30 MG capsule Take 30 mg by mouth daily.   famotidine (PEPCID) 20 MG tablet One after supper   fluticasone (FLONASE) 50 MCG/ACT nasal spray Place 2 sprays into both nostrils daily.   furosemide (LASIX) 40 MG tablet Take 1 tablet (40 mg total) by mouth daily.   isosorbide mononitrate (IMDUR) 30 MG 24 hr tablet TAKE 1 TABLET BY MOUTH DAILY   JARDIANCE 10 MG TABS tablet Take 10 mg by mouth daily.   montelukast (SINGULAIR) 10 MG tablet Take 1 tablet (10 mg total) by mouth at bedtime.   ondansetron (ZOFRAN) 4 MG tablet Take 1 tablet (4 mg total) by mouth every 8 (eight) hours as needed for nausea or vomiting.   pantoprazole (PROTONIX) 40 MG tablet Take 30-60 min before first meal of the day   phenazopyridine (PYRIDIUM) 200 MG tablet Take 1 tablet (200 mg total) by mouth 3 (three) times daily.   potassium chloride (KLOR-CON) 10 MEQ tablet Take 1 tablet (10 mEq total) by mouth 2 (two) times daily.   predniSONE (DELTASONE) 10 MG tablet Take  4 each am x 2 days,   2 each am x 2 days,  1 each am x 2 days and stop    Objective: BP 132/74   Pulse 99   Ht 5\' 4"  (1.626 m)   Wt 145 lb (65.8 kg)   BMI 24.89 kg/m   Physical Exam:  General: Alert and  oriented. and No acute distress. Gait: Right sided antalgic gait.  Evaluation of the right foot demonstrates some mild bruising and swelling to the right fourth toe.  Tenderness to palpation at the proximal phalanx.  Unable to bear full weight.  Toes are warm well-perfused.   IMAGING: I  personally ordered and reviewed the following images  X-rays of the right foot were obtained in clinic today.  Close evaluation of the fourth toe demonstrates a minimally displaced fracture of the proximal phalanx.  This does not involve the joint.  No dislocation.  No additional injuries.  Impression: Right fourth toe, proximal phalanx fracture, minimally displaced   New Medications:  No orders of the defined types were placed in this encounter.     Oliver Barre, MD  07/04/2023 1:35 PM

## 2023-07-04 NOTE — Patient Instructions (Signed)
Buddy tape toes as needed  Postop shoe, ok to bear weight through your heel   Ok to wear whatever shoes are comfortable for your family wedding on Saturday   Follow up in 2-3 weeks

## 2023-07-06 ENCOUNTER — Encounter (HOSPITAL_COMMUNITY): Payer: HMO | Admitting: Physical Therapy

## 2023-07-11 ENCOUNTER — Encounter (HOSPITAL_COMMUNITY): Payer: HMO | Admitting: Physical Therapy

## 2023-07-13 ENCOUNTER — Encounter (HOSPITAL_COMMUNITY): Payer: HMO | Admitting: Physical Therapy

## 2023-07-13 DIAGNOSIS — M1812 Unilateral primary osteoarthritis of first carpometacarpal joint, left hand: Secondary | ICD-10-CM | POA: Diagnosis not present

## 2023-07-18 ENCOUNTER — Other Ambulatory Visit: Payer: Self-pay | Admitting: Cardiovascular Disease

## 2023-07-25 ENCOUNTER — Encounter: Payer: HMO | Admitting: Orthopedic Surgery

## 2023-07-26 ENCOUNTER — Ambulatory Visit
Admission: EM | Admit: 2023-07-26 | Discharge: 2023-07-26 | Disposition: A | Discharge status: Home / Self Care | Payer: HMO | Attending: Nurse Practitioner | Admitting: Nurse Practitioner

## 2023-07-26 DIAGNOSIS — U071 COVID-19: Secondary | ICD-10-CM | POA: Diagnosis not present

## 2023-07-26 LAB — POC COVID19/FLU A&B COMBO
Covid Antigen, POC: POSITIVE — AB
Influenza A Antigen, POC: NEGATIVE
Influenza B Antigen, POC: NEGATIVE

## 2023-07-26 MED ORDER — MOLNUPIRAVIR EUA 200MG CAPSULE: 4.0000 | ORAL_CAPSULE | Freq: Two times a day (BID) | ORAL | 0 refills | Status: DC

## 2023-07-26 MED ORDER — BENZONATATE 100 MG PO CAPS: 100.0000 mg | ORAL_CAPSULE | Freq: Three times a day (TID) | ORAL | 0 refills | Status: DC | PRN

## 2023-07-26 NOTE — Discharge Instructions (Signed)
You tested positive for COVID-19 today.  Take the molnupiravir as prescribed to treat it.  Symptoms should improve over the next week to 10 days.  If you develop chest pain or shortness of breath, go to the emergency room.  Some things that can make you feel better are: - Increased rest - Increasing fluid with water/sugar free electrolytes - Acetaminophen and ibuprofen as needed for fever/pain - Salt water gargling, chloraseptic spray and throat lozenges - OTC guaifenesin (Mucinex) 600 mg twice daily - Saline sinus flushes or a neti pot - Humidifying the air -Tessalon Perles every 8 hours as needed for dry cough

## 2023-07-26 NOTE — ED Triage Notes (Signed)
Pt states cough,runny nose and generalized weakness since last week.

## 2023-07-26 NOTE — ED Provider Notes (Signed)
[facility]    CSN: [id number] Arrival date & time: 07/26/23  1405      History   Chief Complaint Chief Complaint  Patient presents with   Cough    HPI [name] is a 81 y.o. female.   Patient presents today with 2-day history of low-grade fever, dry cough, stuffy nose and thick nasal drainage, headache, decreased appetite, fatigue, and weakness.  She denies bodyaches or chills, congested cough, shortness of breath or chest pain, runny nose, sore throat, ear pain, abdominal pain, nausea/vomiting, and diarrhea.  Has not taken anything for symptoms so far.  She reports multiple members have recently tested positive for COVID-19.    Past Medical History:  Diagnosis Date   Aortic atherosclerosis (HCC)    Asthma    Breast CA (HCC)    Cancer (HCC)    right breast mastectomy   CVA (cerebral vascular accident) (HCC) 04/27/2015   DDD (degenerative disc disease), cervical    Duodenal ulcer    External hemorrhoids    Generalized anxiety disorder    H/O chest pain 03/24/2006   normal nuclear  study   Headache    High cholesterol    Hypertension    Mild aortic insufficiency 03/28/2006   echo   Normal cardiac stress test 2005   Pancreatitis    Personal history of chemotherapy    Personal history of radiation therapy    PONV (postoperative nausea and vomiting)    Sinus bradycardia on ECG    Stroke [facility])     Patient Active Problem List   Diagnosis Date Noted   Acute respiratory failure with hypoxia (HCC) 10/11/2022   RSV (respiratory syncytial virus pneumonia) 10/03/2022   Fracture of proximal phalanx of lesser toe of right foot 10/02/2022   Chronic diastolic CHF (congestive heart failure) (HCC) 10/02/2022   Lactic acidosis 10/02/2022   Mixed hyperlipidemia due to type 2 diabetes mellitus (HCC) 10/02/2022   Antibiotic reaction, initial encounter 10/02/2022   Abnormal CT of the chest 10/02/2022   Dehydration with hyponatremia 10/01/2022   Type 2 diabetes  mellitus with hyperglycemia (HCC) 10/01/2022   Bronchitis 05/25/2021   Asthma exacerbation 05/25/2021   Angina pectoris (HCC)    Arthritis of carpometacarpal (CMC) joint of left thumb 10/03/2017   De Quervain's tenosynovitis, left 10/03/2017   History of stroke 06/13/2017   DOE (dyspnea on exertion) 05/24/2017   Occipital neuralgia of right side 11/02/2016   Palpitations 06/17/2015   Cerebrovascular accident (CVA) due to occlusion of left middle cerebral artery (HCC) 06/17/2015   Hyperlipidemia 06/17/2015   HLD (hyperlipidemia)    Intracranial vascular stenosis    Cerebral infarction due to occlusion of left middle cerebral artery (HCC)    Occipital headache    Extrinsic asthma 07/02/2014   Migraine variant with status migrainosus 07/02/2014   Benign paroxysmal positional vertigo 07/02/2014   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/2014   Essential hypertension 03/03/2014   Mild aortic insufficiency    H/O chest pain    Sinus bradycardia on ECG    GERD without esophagitis 03/24/2008   ADENOCARCINOMA, BREAST, RIGHT 06/30/2007   ASTHMA 06/30/2007   Upper airway cough syndrome 06/30/2007    Past Surgical History:  Procedure Laterality Date   ABDOMINAL HYSTERECTOMY     APPENDECTOMY     "30 years ago"   childbirth     x 2   CHOLECYSTECTOMY     "15 years ago"   ESOPHAGOGASTRODUODENOSCOPY N/A 03/04/2014   Procedure: ESOPHAGOGASTRODUODENOSCOPY (EGD);  Surgeon: Theda Belfast, MD;  Location: [facility] ENDOSCOPY;  Service: Endoscopy;  Laterality: N/A;   MASTECTOMY  1993   right breast   RIGHT/LEFT HEART CATH AND CORONARY ANGIOGRAPHY N/A 05/30/2019   Procedure: RIGHT/LEFT HEART CATH AND CORONARY ANGIOGRAPHY;  Surgeon: Corky Crafts, MD;  Location: [facility] INVASIVE CV LAB;  Service: Cardiovascular;  Laterality: N/A;   TUBAL LIGATION      OB History     Gravida  2   Para  2   Term      Preterm      AB      Living  2      SAB      IAB      Ectopic      Multiple       Live Births               Home Medications    Prior to Admission medications   Medication Sig Start Date End Date Taking? Authorizing Provider  benzonatate (TESSALON) 100 MG capsule Take 1 capsule (100 mg total) by mouth 3 (three) times daily as needed for cough. Do not take with alcohol or while driving or operating heavy machinery.  May cause drowsiness. 07/26/23  Yes Valentino Nose, NP  molnupiravir EUA (LAGEVRIO) 200 mg CAPS capsule Take 4 capsules (800 mg total) by mouth 2 (two) times daily for 5 days. 07/26/23 07/31/23 Yes Valentino Nose, NP  acetaminophen (TYLENOL) 500 MG tablet Take 500 mg by mouth every 6 (six) hours as needed. As needed for pain    [provider]  albuterol (VENTOLIN HFA) 108 (90 Base) MCG/ACT inhaler Use 2 puffs by mouth every 4-6 hours as needed for wheezing 03/01/23   [name], Deloris Ping, MD  amLODipine (NORVASC) 5 MG tablet Take 5 mg by mouth daily.    [provider]  atorvastatin (LIPITOR) 80 MG tablet Take 1 tablet (80 mg total) by mouth daily at 6 PM. 07/06/15   Marvel Plan, MD  azelastine (ASTELIN) 0.1 % nasal spray Place 1 spray into both nostrils 2 (two) times daily. Use in each nostril as directed 09/15/22   Particia Nearing, PA-C  clopidogrel (PLAVIX) 75 MG tablet Take 1 tablet (75 mg total) by mouth daily. 06/01/17   Marvel Plan, MD  dapagliflozin propanediol (FARXIGA) 10 MG TABS tablet Take 1 tablet (10 mg total) by mouth daily before breakfast. 03/01/23   [name], Deloris Ping, MD  DULoxetine (CYMBALTA) 30 MG capsule Take 30 mg by mouth daily. 05/05/21   [provider]  famotidine (PEPCID) 20 MG tablet One after supper 12/23/22   Nyoka Cowden, MD  fluticasone [facility]) 50 MCG/ACT nasal spray Place 2 sprays into both nostrils daily. 04/25/23   Leath-Warren, Sadie Haber, NP  furosemide (LASIX) 40 MG tablet Take 1 tablet (40 mg total) by mouth daily. 03/01/23   [name], Deloris Ping, MD  isosorbide mononitrate (IMDUR) 30 MG 24  hr tablet TAKE ONE TABLET BY MOUTH ONCE DAILY. 07/18/23   [name], Deloris Ping, MD  JARDIANCE 10 MG TABS tablet Take 10 mg by mouth daily. 03/03/23   [provider]  metFORMIN (GLUCOPHAGE) 500 MG tablet Take 1 tablet (500 mg total) by mouth daily with breakfast. 10/05/22 12/23/22  Lanae Boast, MD  montelukast (SINGULAIR) 10 MG tablet Take 1 tablet (10 mg total) by mouth at bedtime. 05/29/21   Leroy Sea, MD  ondansetron (ZOFRAN) 4 MG tablet Take 1 tablet (4 mg total) by  mouth every 8 (eight) hours as needed for nausea or vomiting. 02/01/22   [name], MD  pantoprazole (PROTONIX) 40 MG tablet Take 30-60 min before first meal of the day 12/23/22   Nyoka Cowden, MD  potassium chloride (KLOR-CON) 10 MEQ tablet Take 1 tablet (10 mEq total) by mouth 2 (two) times daily. 03/01/23   [name], Deloris Ping, MD    Family History Family History  Problem Relation Age of Onset   Heart attack Mother    Heart disease Mother    Hypertension Mother    Asthma Mother    Heart attack Father    Heart disease Father    Hypertension Sister    Heart attack Sister    Colon cancer Sister 45   Hypertension Sister    Heart attack Sister    Heart attack Sister    Lung disease Daughter    Thyroid disease Daughter     Social History Social History   Tobacco Use   Smoking status: Never   Smokeless tobacco: Never  Vaping Use   Vaping status: Never Used  Substance Use Topics   Alcohol use: No    Alcohol/week: 0.0 standard drinks of alcohol   Drug use: No     Allergies   Oxycodone-acetaminophen, Ciprofloxacin, Codeine, Doxycycline, Percocet [oxycodone-acetaminophen], and Tramadol   Review of Systems Review of Systems Per HPI  Physical Exam Triage Vital Signs ED Triage Vitals  Encounter Vitals Group     BP 07/26/23 1431 (!) 154/81     Systolic BP Percentile --      Diastolic BP Percentile --      Pulse Rate 07/26/23 1431 (!) 103     Resp 07/26/23 1431 16     Temp 07/26/23 1431 100.3 F  (37.9 C)     Temp Source 07/26/23 1431 Oral     SpO2 07/26/23 1431 94 %     Weight --      Height --      Head Circumference --      Peak Flow --      Pain Score 07/26/23 1432 0     Pain Loc --      Pain Education --      Exclude from Growth Chart --    No data found.  Updated Vital Signs BP (!) 154/81 (BP Location: Left Arm)   Pulse (!) 103   Temp 100.3 F (37.9 C) (Oral)   Resp 16   SpO2 94%   Visual Acuity Right Eye Distance:   Left Eye Distance:   Bilateral Distance:    Right Eye Near:   Left Eye Near:    Bilateral Near:     Physical Exam Vitals and nursing note reviewed.  Constitutional:      General: She is not in acute distress.    Appearance: Normal appearance. She is ill-appearing. She is not toxic-appearing.  HENT:     Head: Normocephalic and atraumatic.     Right Ear: Tympanic membrane, ear canal and external ear normal.     Left Ear: Tympanic membrane, ear canal and external ear normal.     Nose: No congestion or rhinorrhea.     Mouth/Throat:     Mouth: Mucous membranes are moist.     Pharynx: Oropharynx is clear. Posterior oropharyngeal erythema present. No oropharyngeal exudate.  Eyes:     General: No scleral icterus.    Extraocular Movements: Extraocular movements intact.  Cardiovascular:     Rate and Rhythm:  Normal rate and regular rhythm.  Pulmonary:     Effort: Pulmonary effort is normal. No respiratory distress.     Breath sounds: Normal breath sounds. No wheezing, rhonchi or rales.  Musculoskeletal:     Cervical back: Normal range of motion and neck supple.  Lymphadenopathy:     Cervical: No cervical adenopathy.  Skin:    General: Skin is warm and dry.     Coloration: Skin is not jaundiced or pale.     Findings: No erythema or rash.  Neurological:     Mental Status: She is alert and oriented to person, place, and time.  Psychiatric:        Behavior: Behavior is cooperative.      UC Treatments / Results  Labs (all labs ordered  are listed, but only abnormal results are displayed) Labs Reviewed  POC COVID19/FLU A&B COMBO - Abnormal; Notable for the following components:      Result Value   Covid Antigen, POC Positive (*)    All other components within normal limits    EKG   Radiology No results found.  Procedures Procedures (including critical care time)  Medications Ordered in UC Medications - No data to display  Initial Impression / Assessment and Plan / UC Course  I have reviewed the triage vital signs and the nursing notes.  Pertinent labs & imaging results that were available during my care of the patient were reviewed by me and considered in my medical decision making (see chart for details).   Patient is ill-appearing, however nontoxic.  She is slightly hypertensive, slightly tachycardic, and has a low-grade fever in triage today.  SpO2 is normal on room air and patient is not tachypneic.    1. COVID-19 Overall, exam is benign and vitals are stable COVID-19 test positive in urgent care today Patient not a candidate for Paxlovid due to Plavix, will treat with molnupiravir Supportive care discussed with patient ER precautions discussed  The patient was given the opportunity to ask questions.  All questions answered to their satisfaction.  The patient is in agreement to this plan.    Final Clinical Impressions(s) / UC Diagnoses   Final diagnoses:  COVID-19     Discharge Instructions      You tested positive for COVID-19 today.  Take the molnupiravir as prescribed to treat it.  Symptoms should improve over the next week to 10 days.  If you develop chest pain or shortness of breath, go to the emergency room.  Some things that can make you feel better are: - Increased rest - Increasing fluid with water/sugar free electrolytes - Acetaminophen and ibuprofen as needed for fever/pain - Salt water gargling, chloraseptic spray and throat lozenges - OTC guaifenesin (Mucinex) 600 mg twice  daily - Saline sinus flushes or a neti pot - Humidifying the air -Tessalon Perles every 8 hours as needed for dry cough      ED Prescriptions     Medication Sig Dispense Auth. Provider   molnupiravir EUA (LAGEVRIO) 200 mg CAPS capsule Take 4 capsules (800 mg total) by mouth 2 (two) times daily for 5 days. 40 capsule Cathlean Marseilles A, NP   benzonatate (TESSALON) 100 MG capsule Take 1 capsule (100 mg total) by mouth 3 (three) times daily as needed for cough. Do not take with alcohol or while driving or operating heavy machinery.  May cause drowsiness. 21 capsule Valentino Nose, NP      PDMP not reviewed this encounter.   Bradly Bienenstock,  Guadlupe Spanish, NP 07/26/23 [phone]

## 2023-07-27 ENCOUNTER — Other Ambulatory Visit (HOSPITAL_COMMUNITY): Payer: Self-pay

## 2023-07-27 ENCOUNTER — Telehealth: Payer: Self-pay

## 2023-07-27 MED ORDER — MOLNUPIRAVIR EUA 200MG CAPSULE
4.0000 | ORAL_CAPSULE | Freq: Two times a day (BID) | ORAL | 0 refills | Status: AC
  Filled 2023-07-27: qty 40, 5d supply, fill #0

## 2023-07-27 NOTE — Telephone Encounter (Signed)
Pt called d/t unable to find a pharmacy that has molnupiravir in stock. Pt pharmacy changed. Pt agreed.

## 2023-07-31 DIAGNOSIS — U071 COVID-19: Secondary | ICD-10-CM | POA: Diagnosis not present

## 2023-07-31 DIAGNOSIS — R112 Nausea with vomiting, unspecified: Secondary | ICD-10-CM | POA: Diagnosis not present

## 2023-07-31 DIAGNOSIS — R531 Weakness: Secondary | ICD-10-CM | POA: Diagnosis not present

## 2023-07-31 DIAGNOSIS — J111 Influenza due to unidentified influenza virus with other respiratory manifestations: Secondary | ICD-10-CM | POA: Diagnosis not present

## 2023-08-01 ENCOUNTER — Encounter: Payer: HMO | Admitting: Orthopedic Surgery

## 2023-08-02 ENCOUNTER — Other Ambulatory Visit (HOSPITAL_COMMUNITY): Payer: HMO

## 2023-08-02 ENCOUNTER — Ambulatory Visit (HOSPITAL_COMMUNITY): Payer: HMO | Attending: Family Medicine | Admitting: Speech Pathology

## 2023-08-04 ENCOUNTER — Telehealth: Payer: Self-pay | Admitting: Pharmacist

## 2023-08-04 NOTE — Progress Notes (Signed)
Pharmacy Quality Measure Review  This patient is appearing on a report for being at risk of failing the adherence measure for diabetes medications this calendar year.   Medication: [place] Pepper  Last fill date: 10/292024 for 30 day supply  Patient declines my assistance with coordinating a new prescription. Patient prefers to wait until next office visit on 08/15/2023. Note sent to office for PCP to review. Of note, patient has not filled Metformin since 12/07/2022.   Reynold Bowen, PharmD Clinical Pharmacist [place] Direct Dial: [phone]

## 2023-08-14 DIAGNOSIS — I2510 Atherosclerotic heart disease of native coronary artery without angina pectoris: Secondary | ICD-10-CM | POA: Diagnosis not present

## 2023-08-14 DIAGNOSIS — E1122 Type 2 diabetes mellitus with diabetic chronic kidney disease: Secondary | ICD-10-CM | POA: Diagnosis not present

## 2023-08-14 DIAGNOSIS — I5030 Unspecified diastolic (congestive) heart failure: Secondary | ICD-10-CM | POA: Diagnosis not present

## 2023-08-14 DIAGNOSIS — I10 Essential (primary) hypertension: Secondary | ICD-10-CM | POA: Diagnosis not present

## 2023-08-15 ENCOUNTER — Encounter: Payer: HMO | Admitting: Orthopedic Surgery

## 2023-08-15 DIAGNOSIS — I700 Atherosclerosis of aorta: Secondary | ICD-10-CM | POA: Diagnosis not present

## 2023-08-15 DIAGNOSIS — Z0000 Encounter for general adult medical examination without abnormal findings: Secondary | ICD-10-CM | POA: Diagnosis not present

## 2023-08-15 DIAGNOSIS — Z9181 History of falling: Secondary | ICD-10-CM | POA: Diagnosis not present

## 2023-08-15 DIAGNOSIS — I2510 Atherosclerotic heart disease of native coronary artery without angina pectoris: Secondary | ICD-10-CM | POA: Diagnosis not present

## 2023-08-15 DIAGNOSIS — I7774 Dissection of vertebral artery: Secondary | ICD-10-CM | POA: Diagnosis not present

## 2023-08-15 DIAGNOSIS — I5030 Unspecified diastolic (congestive) heart failure: Secondary | ICD-10-CM | POA: Diagnosis not present

## 2023-08-15 DIAGNOSIS — R110 Nausea: Secondary | ICD-10-CM | POA: Diagnosis not present

## 2023-08-15 DIAGNOSIS — Z13820 Encounter for screening for osteoporosis: Secondary | ICD-10-CM | POA: Diagnosis not present

## 2023-08-15 DIAGNOSIS — I25119 Atherosclerotic heart disease of native coronary artery with unspecified angina pectoris: Secondary | ICD-10-CM | POA: Diagnosis not present

## 2023-08-15 DIAGNOSIS — F411 Generalized anxiety disorder: Secondary | ICD-10-CM | POA: Diagnosis not present

## 2023-08-15 DIAGNOSIS — K219 Gastro-esophageal reflux disease without esophagitis: Secondary | ICD-10-CM | POA: Diagnosis not present

## 2023-08-15 DIAGNOSIS — I10 Essential (primary) hypertension: Secondary | ICD-10-CM | POA: Diagnosis not present

## 2023-08-17 ENCOUNTER — Encounter: Payer: Self-pay | Admitting: Family Medicine

## 2023-08-17 ENCOUNTER — Other Ambulatory Visit: Payer: Self-pay | Admitting: Family Medicine

## 2023-08-17 DIAGNOSIS — Z13820 Encounter for screening for osteoporosis: Secondary | ICD-10-CM

## 2023-08-21 DIAGNOSIS — L218 Other seborrheic dermatitis: Secondary | ICD-10-CM | POA: Diagnosis not present

## 2023-08-21 DIAGNOSIS — T148XXA Other injury of unspecified body region, initial encounter: Secondary | ICD-10-CM | POA: Diagnosis not present

## 2023-08-22 ENCOUNTER — Other Ambulatory Visit (HOSPITAL_COMMUNITY): Payer: Self-pay | Admitting: Occupational Therapy

## 2023-08-24 ENCOUNTER — Other Ambulatory Visit (HOSPITAL_COMMUNITY): Payer: Self-pay | Admitting: Occupational Therapy

## 2023-09-02 ENCOUNTER — Emergency Department (HOSPITAL_COMMUNITY)
Admission: EM | Admit: 2023-09-02 | Discharge: 2023-09-03 | Disposition: A | Discharge status: Home / Self Care | Payer: HMO | Attending: Emergency Medicine | Admitting: Emergency Medicine

## 2023-09-02 ENCOUNTER — Encounter (HOSPITAL_COMMUNITY): Payer: Self-pay

## 2023-09-02 ENCOUNTER — Other Ambulatory Visit: Payer: Self-pay

## 2023-09-02 DIAGNOSIS — J45909 Unspecified asthma, uncomplicated: Secondary | ICD-10-CM | POA: Diagnosis not present

## 2023-09-02 DIAGNOSIS — I10 Essential (primary) hypertension: Secondary | ICD-10-CM | POA: Diagnosis not present

## 2023-09-02 DIAGNOSIS — R112 Nausea with vomiting, unspecified: Secondary | ICD-10-CM | POA: Diagnosis not present

## 2023-09-02 DIAGNOSIS — E876 Hypokalemia: Secondary | ICD-10-CM | POA: Diagnosis not present

## 2023-09-02 DIAGNOSIS — Z7984 Long term (current) use of oral hypoglycemic drugs: Secondary | ICD-10-CM | POA: Insufficient documentation

## 2023-09-02 DIAGNOSIS — Z79899 Other long term (current) drug therapy: Secondary | ICD-10-CM | POA: Insufficient documentation

## 2023-09-02 DIAGNOSIS — Z7902 Long term (current) use of antithrombotics/antiplatelets: Secondary | ICD-10-CM | POA: Diagnosis not present

## 2023-09-02 DIAGNOSIS — Z7951 Long term (current) use of inhaled steroids: Secondary | ICD-10-CM | POA: Insufficient documentation

## 2023-09-02 DIAGNOSIS — R197 Diarrhea, unspecified: Secondary | ICD-10-CM | POA: Diagnosis not present

## 2023-09-02 DIAGNOSIS — E119 Type 2 diabetes mellitus without complications: Secondary | ICD-10-CM | POA: Insufficient documentation

## 2023-09-02 LAB — CBC
HCT: 43.6 % (ref 36.0–46.0)
Hemoglobin: 14.9 g/dL (ref 12.0–15.0)
MCH: 30 pg (ref 26.0–34.0)
MCHC: 34.2 g/dL (ref 30.0–36.0)
MCV: 87.9 fL (ref 80.0–100.0)
Platelets: 267 10*3/uL (ref 150–400)
RBC: 4.96 MIL/uL (ref 3.87–5.11)
RDW: 13.2 % (ref 11.5–15.5)
WBC: 16.4 10*3/uL — ABNORMAL HIGH (ref 4.0–10.5)
nRBC: 0 % (ref 0.0–0.2)

## 2023-09-02 MED ORDER — PROMETHAZINE HCL 25 MG/ML IJ SOLN
12.5000 mg | Freq: Once | INTRAMUSCULAR | Status: AC
  Administered 2023-09-02: 12.5 mg via INTRAMUSCULAR

## 2023-09-02 NOTE — ED Notes (Signed)
Pt in restroom 

## 2023-09-02 NOTE — ED Triage Notes (Signed)
Vomiting and diarrhea x2 hours Denies all pain   Denies blood in vomit and stool

## 2023-09-03 LAB — COMPREHENSIVE METABOLIC PANEL
ALT: 18 U/L (ref 0–44)
AST: 18 U/L (ref 15–41)
Albumin: 4.2 g/dL (ref 3.5–5.0)
Alkaline Phosphatase: 83 U/L (ref 38–126)
Anion gap: 13 (ref 5–15)
BUN: 19 mg/dL (ref 8–23)
CO2: 23 mmol/L (ref 22–32)
Calcium: 9.5 mg/dL (ref 8.9–10.3)
Chloride: 102 mmol/L (ref 98–111)
Creatinine, Ser: 0.84 mg/dL (ref 0.44–1.00)
GFR, Estimated: >60 mL/min (ref >60)
Glucose, Bld: 207 mg/dL — ABNORMAL HIGH (ref 70–99)
Potassium: 3.3 mmol/L — ABNORMAL LOW (ref 3.5–5.1)
Sodium: 138 mmol/L (ref 135–145)
Total Bilirubin: 0.8 mg/dL (ref <1.2)
Total Protein: 7.5 g/dL (ref 6.5–8.1)

## 2023-09-03 LAB — LIPASE, BLOOD: Lipase: 25 U/L (ref 11–51)

## 2023-09-03 MED ORDER — POTASSIUM CHLORIDE CRYS ER 20 MEQ PO TBCR
40.0000 meq | EXTENDED_RELEASE_TABLET | Freq: Once | ORAL | Status: AC
  Administered 2023-09-03: 40 meq via ORAL
  Filled 2023-09-03: qty 2

## 2023-09-03 MED ORDER — ONDANSETRON HCL 4 MG/2ML IJ SOLN
4.0000 mg | Freq: Once | INTRAMUSCULAR | Status: AC
  Administered 2023-09-03: 4 mg via INTRAVENOUS
  Filled 2023-09-03: qty 2

## 2023-09-03 MED ORDER — SODIUM CHLORIDE 0.9 % IV BOLUS
1000.0000 mL | Freq: Once | INTRAVENOUS | Status: AC
  Administered 2023-09-03: 1000 mL via INTRAVENOUS

## 2023-09-03 MED ORDER — ONDANSETRON HCL 4 MG PO TABS: 4.0000 mg | ORAL_TABLET | Freq: Three times a day (TID) | ORAL | 0 refills | Status: DC | PRN

## 2023-09-03 MED ORDER — ONDANSETRON 4 MG PO TBDP: 4.0000 mg | ORAL_TABLET | Freq: Three times a day (TID) | ORAL | 0 refills | Status: DC | PRN

## 2023-09-03 NOTE — Discharge Instructions (Addendum)
Take loperamide (Imodium A-D) as needed for diarrhea. ?

## 2023-09-03 NOTE — ED Provider Notes (Signed)
[place] EMERGENCY DEPARTMENT AT [facility] Provider Note   CSN: [id number] Arrival date & time: 09/02/23  2227     History  Chief Complaint  Patient presents with   Emesis    [name] is a 82 y.o. female.  The history is provided by the patient.  Emesis She has history of hypertension, diabetes, hyperlipidemia, asthma and is in because of vomiting and diarrhea which started about 7 PM.  She has vomited numerous times, estimates 6 loose bowel movements.  She no longer feels like she is going to have more diarrhea but continues to have nausea.  She denies abdominal pain but is complaining of some aching across her interscapular area.  She denies fever or chills.  She denies any sick contacts or suspicious food intake.   Home Medications Prior to Admission medications   Medication Sig Start Date End Date Taking? Authorizing Provider  acetaminophen (TYLENOL) 500 MG tablet Take 500 mg by mouth every 6 (six) hours as needed. As needed for pain    [provider]  albuterol (VENTOLIN HFA) 108 (90 Base) MCG/ACT inhaler Use 2 puffs by mouth every 4-6 hours as needed for wheezing 03/01/23   [name], Deloris Ping, MD  amLODipine (NORVASC) 5 MG tablet Take 5 mg by mouth daily.    [provider]  atorvastatin (LIPITOR) 80 MG tablet Take 1 tablet (80 mg total) by mouth daily at 6 PM. 07/06/15   Marvel Plan, MD  azelastine (ASTELIN) 0.1 % nasal spray Place 1 spray into both nostrils 2 (two) times daily. Use in each nostril as directed 09/15/22   Particia Nearing, PA-C  benzonatate (TESSALON) 100 MG capsule Take 1 capsule (100 mg total) by mouth 3 (three) times daily as needed for cough. Do not take with alcohol or while driving or operating heavy machinery.  May cause drowsiness. 07/26/23   Valentino Nose, NP  clopidogrel (PLAVIX) 75 MG tablet Take 1 tablet (75 mg total) by mouth daily. 06/01/17   Marvel Plan, MD  dapagliflozin propanediol (FARXIGA) 10 MG TABS  tablet Take 1 tablet (10 mg total) by mouth daily before breakfast. 03/01/23   [name], Deloris Ping, MD  DULoxetine (CYMBALTA) 30 MG capsule Take 30 mg by mouth daily. 05/05/21   [provider]  famotidine (PEPCID) 20 MG tablet One after supper 12/23/22   Nyoka Cowden, MD  fluticasone [facility]) 50 MCG/ACT nasal spray Place 2 sprays into both nostrils daily. 04/25/23   Leath-Warren, Sadie Haber, NP  furosemide (LASIX) 40 MG tablet Take 1 tablet (40 mg total) by mouth daily. 03/01/23   [name], Deloris Ping, MD  isosorbide mononitrate (IMDUR) 30 MG 24 hr tablet TAKE ONE TABLET BY MOUTH ONCE DAILY. 07/18/23   [name], Deloris Ping, MD  JARDIANCE 10 MG TABS tablet Take 10 mg by mouth daily. 03/03/23   [provider]  metFORMIN (GLUCOPHAGE) 500 MG tablet Take 1 tablet (500 mg total) by mouth daily with breakfast. 10/05/22 12/23/22  Lanae Boast, MD  montelukast (SINGULAIR) 10 MG tablet Take 1 tablet (10 mg total) by mouth at bedtime. 05/29/21   Leroy Sea, MD  ondansetron (ZOFRAN) 4 MG tablet Take 1 tablet (4 mg total) by mouth every 8 (eight) hours as needed for nausea or vomiting. 02/01/22   [name], MD  pantoprazole (PROTONIX) 40 MG tablet Take 30-60 min before first meal of the day 12/23/22   Nyoka Cowden, MD  potassium chloride (KLOR-CON) 10 MEQ  tablet Take 1 tablet (10 mEq total) by mouth 2 (two) times daily. 03/01/23   [name], Deloris Ping, MD      Allergies    Oxycodone-acetaminophen, Ciprofloxacin, Codeine, Doxycycline, Percocet [oxycodone-acetaminophen], and Tramadol    Review of Systems   Review of Systems  Gastrointestinal:  Positive for vomiting.  All other systems reviewed and are negative.   Physical Exam Updated Vital Signs BP (!) 162/92 (BP Location: Right Arm)   Pulse (!) 107   Temp 99.1 F (37.3 C)   Resp 18   Ht 5\' 5"  (1.651 m)   Wt 67.1 kg   SpO2 100%   BMI 24.63 kg/m  Physical Exam Vitals and nursing note reviewed.   82 year old female, resting comfortably  and in no acute distress. Vital signs are significant for elevated blood pressure and slightly elevated heart rate. Oxygen saturation is 100%, which is normal. Head is normocephalic and atraumatic. PERRLA, EOMI. Oropharynx is clear. Neck is nontender and supple without adenopathy. Lungs are clear without rales, wheezes, or rhonchi. Chest is nontender. Heart has regular rate and rhythm without murmur. Abdomen is soft, flat, nontender. Extremities have no cyanosis or edema, full range of motion is present. Skin is warm and dry without rash. Neurologic: Mental status is normal, moves all extremities equally.  ED Results / Procedures / Treatments   Labs (all labs ordered are listed, but only abnormal results are displayed) Labs Reviewed  COMPREHENSIVE METABOLIC PANEL - Abnormal; Notable for the following components:      Result Value   Potassium 3.3 (*)    Glucose, Bld 207 (*)    All other components within normal limits  CBC - Abnormal; Notable for the following components:   WBC 16.4 (*)    All other components within normal limits  LIPASE, BLOOD  URINALYSIS, ROUTINE W REFLEX MICROSCOPIC   Procedures Procedures  Cardiac monitor shows sinus rhythm, per my interpretation.  Medications Ordered in ED Medications  potassium chloride SA (KLOR-CON M) CR tablet 40 mEq (has no administration in time range)  promethazine (PHENERGAN) injection 12.5 mg (12.5 mg Intramuscular Given 09/02/23 2243)  sodium chloride 0.9 % bolus 1,000 mL (1,000 mLs Intravenous New Bag/Given 09/03/23 0035)  ondansetron (ZOFRAN) injection 4 mg (4 mg Intravenous Given 09/03/23 0035)    ED Course/ Medical Decision Making/ A&P                                 Medical Decision Making Amount and/or Complexity of Data Reviewed Labs: ordered.  Risk Prescription drug management.   Nausea, vomiting, diarrhea in the setting of some myalgias strongly suggestive of viral gastroenteritis.  Consider food poisoning.  No  red flags to suggest more serious pathology such as bowel obstruction, diverticulitis.  I have ordered IV fluids, ondansetron for nausea.  I have ordered screening labs of CBC, comprehensive metabolic panel, lipase, urinalysis.  I have reviewed her laboratory tests, and my interpretation is mild hypokalemia likely secondary to GI losses.  I have ordered a dose of oral potassium.  Elevated random glucose is consistent with known history of diabetes.  Leukocytosis which is nonspecific.  I have reevaluated her and she is feeling much better.  I am discharging her with prescription for ondansetron oral dissolving tablet, advised her to use over-the-counter loperamide as needed if diarrhea recurs.  Final Clinical Impression(s) / ED Diagnoses Final diagnoses:  Nausea vomiting and diarrhea  Hypokalemia  Rx / DC Orders ED Discharge Orders          Ordered    ondansetron (ZOFRAN-ODT) 4 MG disintegrating tablet  Every 8 hours PRN        09/03/23 0152    ondansetron (ZOFRAN) 4 MG tablet  Every 8 hours PRN        09/03/23 0152              Dione Booze, MD 09/03/23 [phone]

## 2023-09-03 NOTE — ED Notes (Signed)
ED Provider at bedside. 

## 2023-09-03 NOTE — ED Notes (Signed)
Pt states that she cannot provide a sample at this time. Pt was told to use call light once she had to use the restroom.

## 2023-09-10 DIAGNOSIS — R4182 Altered mental status, unspecified: Secondary | ICD-10-CM | POA: Diagnosis not present

## 2023-09-10 DIAGNOSIS — S3993XA Unspecified injury of pelvis, initial encounter: Secondary | ICD-10-CM | POA: Diagnosis not present

## 2023-09-10 DIAGNOSIS — Z043 Encounter for examination and observation following other accident: Secondary | ICD-10-CM | POA: Diagnosis not present

## 2023-09-10 DIAGNOSIS — Z8673 Personal history of transient ischemic attack (TIA), and cerebral infarction without residual deficits: Secondary | ICD-10-CM | POA: Diagnosis not present

## 2023-09-10 DIAGNOSIS — Z853 Personal history of malignant neoplasm of breast: Secondary | ICD-10-CM | POA: Diagnosis not present

## 2023-09-10 DIAGNOSIS — R5381 Other malaise: Secondary | ICD-10-CM | POA: Diagnosis not present

## 2023-09-10 DIAGNOSIS — R918 Other nonspecific abnormal finding of lung field: Secondary | ICD-10-CM | POA: Diagnosis not present

## 2023-09-10 DIAGNOSIS — S3219XA Other fracture of sacrum, initial encounter for closed fracture: Secondary | ICD-10-CM | POA: Diagnosis not present

## 2023-09-10 DIAGNOSIS — Z9011 Acquired absence of right breast and nipple: Secondary | ICD-10-CM | POA: Diagnosis not present

## 2023-09-10 DIAGNOSIS — R100 Acute abdomen: Secondary | ICD-10-CM | POA: Diagnosis not present

## 2023-09-10 DIAGNOSIS — Z743 Need for continuous supervision: Secondary | ICD-10-CM | POA: Diagnosis not present

## 2023-09-10 DIAGNOSIS — S4992XA Unspecified injury of left shoulder and upper arm, initial encounter: Secondary | ICD-10-CM | POA: Diagnosis not present

## 2023-09-10 DIAGNOSIS — E785 Hyperlipidemia, unspecified: Secondary | ICD-10-CM | POA: Diagnosis not present

## 2023-09-10 DIAGNOSIS — S51012A Laceration without foreign body of left elbow, initial encounter: Secondary | ICD-10-CM | POA: Diagnosis not present

## 2023-09-10 DIAGNOSIS — S3210XA Unspecified fracture of sacrum, initial encounter for closed fracture: Secondary | ICD-10-CM | POA: Diagnosis not present

## 2023-09-10 DIAGNOSIS — S3991XA Unspecified injury of abdomen, initial encounter: Secondary | ICD-10-CM | POA: Diagnosis not present

## 2023-09-10 DIAGNOSIS — Z9882 Breast implant status: Secondary | ICD-10-CM | POA: Diagnosis not present

## 2023-09-10 DIAGNOSIS — R55 Syncope and collapse: Secondary | ICD-10-CM | POA: Diagnosis not present

## 2023-09-10 DIAGNOSIS — R519 Headache, unspecified: Secondary | ICD-10-CM | POA: Diagnosis not present

## 2023-09-10 DIAGNOSIS — T1490XA Injury, unspecified, initial encounter: Secondary | ICD-10-CM | POA: Diagnosis not present

## 2023-09-10 DIAGNOSIS — G8911 Acute pain due to trauma: Secondary | ICD-10-CM | POA: Diagnosis not present

## 2023-09-10 DIAGNOSIS — Z7902 Long term (current) use of antithrombotics/antiplatelets: Secondary | ICD-10-CM | POA: Diagnosis not present

## 2023-09-10 DIAGNOSIS — S199XXA Unspecified injury of neck, initial encounter: Secondary | ICD-10-CM | POA: Diagnosis not present

## 2023-09-10 DIAGNOSIS — W109XXA Fall (on) (from) unspecified stairs and steps, initial encounter: Secondary | ICD-10-CM | POA: Diagnosis not present

## 2023-09-10 DIAGNOSIS — S0011XA Contusion of right eyelid and periocular area, initial encounter: Secondary | ICD-10-CM | POA: Diagnosis not present

## 2023-09-10 DIAGNOSIS — R079 Chest pain, unspecified: Secondary | ICD-10-CM | POA: Diagnosis not present

## 2023-09-10 DIAGNOSIS — S299XXA Unspecified injury of thorax, initial encounter: Secondary | ICD-10-CM | POA: Diagnosis not present

## 2023-09-10 DIAGNOSIS — W108XXA Fall (on) (from) other stairs and steps, initial encounter: Secondary | ICD-10-CM | POA: Diagnosis not present

## 2023-09-10 DIAGNOSIS — S2232XA Fracture of one rib, left side, initial encounter for closed fracture: Secondary | ICD-10-CM | POA: Diagnosis not present

## 2023-09-10 DIAGNOSIS — S59912A Unspecified injury of left forearm, initial encounter: Secondary | ICD-10-CM | POA: Diagnosis not present

## 2023-09-10 DIAGNOSIS — I10 Essential (primary) hypertension: Secondary | ICD-10-CM | POA: Diagnosis not present

## 2023-09-12 ENCOUNTER — Encounter (HOSPITAL_BASED_OUTPATIENT_CLINIC_OR_DEPARTMENT_OTHER): Payer: HMO | Attending: General Surgery | Admitting: General Surgery

## 2023-09-12 DIAGNOSIS — I5032 Chronic diastolic (congestive) heart failure: Secondary | ICD-10-CM | POA: Insufficient documentation

## 2023-09-12 DIAGNOSIS — E11622 Type 2 diabetes mellitus with other skin ulcer: Secondary | ICD-10-CM | POA: Diagnosis not present

## 2023-09-12 DIAGNOSIS — L98499 Non-pressure chronic ulcer of skin of other sites with unspecified severity: Secondary | ICD-10-CM | POA: Diagnosis not present

## 2023-09-12 DIAGNOSIS — S61412A Laceration without foreign body of left hand, initial encounter: Secondary | ICD-10-CM | POA: Diagnosis not present

## 2023-09-12 DIAGNOSIS — I110 Hypertensive heart disease with heart failure: Secondary | ICD-10-CM | POA: Insufficient documentation

## 2023-09-12 NOTE — Progress Notes (Addendum)
 [name], [name] ([id number]) [phone].pdf Page 1 of 4 Visit Report for 09/12/2023 Abuse Risk Screen Details Patient Name: Date of Service: [name], [name]. 09/12/2023 10:00 A M Medical Record Number: [id number] Patient Account Number: [id number] Date of Birth/Sex: Treating RN: [birth year] (82 y.o. [name] Primary Care [name]: [name] Other Clinician: Referring [name]: Treating [name]/Extender: [name] [name] [name] in Treatment: 0 Abuse Risk Screen Items Answer ABUSE RISK SCREEN: Has anyone close to you tried to hurt or harm you recentlyo No Do you feel uncomfortable with anyone in your familyo No Has anyone forced you do things that you didnt want to doo No Electronic Signature(s) Signed: 09/12/2023 4:41:36 PM By: [name] Nestle RN, BSN Entered By: [name] on 09/12/2023 10:17:27 -------------------------------------------------------------------------------- Activities of Daily Living Details Patient Name: Date of Service: [name], [name] 09/12/2023 10:00 A M Medical Record Number: [id number] Patient Account Number: [id number] Date of Birth/Sex: Treating RN: 08-20-1941 (82 y.o. [name] Primary Care [name]: [name] Other Clinician: Referring Azucena Dart: Treating [name]/Extender: [name] [name] [name] in Treatment: 0 Activities of Daily Living Items Answer Activities of Daily Living (Please select one for each item) Drive Automobile Completely Able T Medications ake Completely Able Use T elephone Completely Able Care for Appearance Completely Able Use T oilet Completely Able Bath / Shower Completely Able Dress Self Completely Able Feed Self Completely Able Walk Completely Able Get In / Out Bed Completely Able Housework Completely Able Prepare Meals Completely Able Handle Money Completely Able Shop for Self Completely Able Electronic Signature(s) Signed: 09/12/2023 4:41:36 PM By:  [name] Nestle RN, BSN Entered By: [name] on 09/12/2023 10:18:12 [name] ([id number]) [id number] Page 2 of 4 -------------------------------------------------------------------------------- Education Screening Details Patient Name: Date of Service: [name], [name]. 09/12/2023 10:00 A M Medical Record Number: [id number] Patient Account Number: [id number] Date of Birth/Sex: Treating RN: 03/31/1941 (82 y.o. [name] Primary Care [name]: [name] Other Clinician: Referring Janiqua Friscia: Treating [name]/Extender: [name] [name] [name] in Treatment: 0 Primary Learner Assessed: Patient Learning Preferences/Education Level/Primary Language Learning Preference: Explanation, Demonstration, Printed Material Highest Education Level: High School Preferred Language: English Cognitive Barrier Language Barrier: No Translator Needed: No Memory Deficit: No Emotional Barrier: No Cultural/Religious Beliefs Affecting Medical Care: No Physical Barrier Impaired Vision: No Impaired Hearing: No Decreased Hand dexterity: No Knowledge/Comprehension Knowledge Level: High Comprehension Level: High Ability to understand written instructions: High Ability to understand verbal instructions: High Motivation Anxiety Level: Calm Cooperation: Cooperative Education Importance: Acknowledges Need Interest in Health Problems: Asks Questions Perception: Coherent Willingness to Engage in Self-Management High Activities: Readiness to Engage in Self-Management High Activities: Electronic Signature(s) Signed: 09/12/2023 4:41:36 PM By: [name] Nestle RN, BSN Entered By: [name] on 09/12/2023 10:18:41 -------------------------------------------------------------------------------- Fall Risk Assessment Details Patient Name: Date of Service: [name] 09/12/2023 10:00 A M Medical Record Number: [id number] Patient Account Number:  [id number] Date of Birth/Sex: Treating RN: 10/19/1940 (82 y.o. F) Primary Care Jury Caserta: [name] Other Clinician: Referring Braxtyn Dorff: Treating Mykeal Carrick/Extender: [name] [name] [name] in Treatment: 0 Fall Risk Assessment Items Have you had 2 or more falls in the last 12 monthso 0 Yes [name] ([id number]) [phone] [id number] Page 3 of 4 Have you had any fall that resulted in injury in the last 12 monthso 0 Yes FALLS RISK SCREEN History of falling - immediate or within 3 months 25 Yes Secondary diagnosis (Do you have 2 or more medical diagnoseso) 15 Yes Ambulatory aid None/bed rest/wheelchair/nurse 0  No Crutches/cane/walker 0 No Furniture 0 No Intravenous therapy Access/Saline/Heparin  Lock 0 No Gait/Transferring Normal/ bed rest/ wheelchair 0 No Weak (short steps with or without shuffle, stooped but able to lift head while walking, may seek 0 No support from furniture) Impaired (short steps with shuffle, may have difficulty arising from chair, head down, impaired 0 No balance) Mental Status Oriented to own ability 0 Yes Electronic Signature(s) Signed: 09/12/2023 4:41:36 PM By: [name] Nestle RN, BSN Previous Signature: 09/12/2023 9:29:06 AM Version By: Saine, Jodie Entered By: [name] on 09/12/2023 10:18:50 -------------------------------------------------------------------------------- Foot Assessment Details Patient Name: Date of Service: [name] 09/12/2023 10:00 A M Medical Record Number: [id number] Patient Account Number: [id number] Date of Birth/Sex: Treating RN: 10-21-1940 (82 y.o. F) Primary Care Happy [place]: [name] Other Clinician: Referring Clytee Heinrich: Treating Corianna Avallone/Extender: [name] [name] [name] in Treatment: 0 Foot Assessment Items Site Locations + = Sensation present, - = Sensation absent, C = Callus, U = Ulcer [name] = Redness, W = Warmth, M = Maceration, PU = Pre-ulcerative lesion F =  Fissure, S = Swelling, D = Dryness Assessment Right: Left: Other Deformity: No No Prior Foot Ulcer: No No Prior Amputation: No No Charcot Joint: No No Ambulatory Status: GaitBRYTNEE, BECHLER [name] ([id number]) [id number] Page 4 of 4 Notes No BLE wounds. Electronic Signature(s) Signed: 09/12/2023 4:41:36 PM By: [name] Nestle RN, BSN Previous Signature: 09/12/2023 9:29:32 AM Version By: Saine, Jodie Entered By: [name] on 09/12/2023 10:19:15 -------------------------------------------------------------------------------- Nutrition Risk Screening Details Patient Name: Date of Service: [name] 09/12/2023 10:00 A M Medical Record Number: [id number] Patient Account Number: [id number] Date of Birth/Sex: Treating RN: 1940-11-28 (82 y.o. F) Primary Care Kalyb Pemble: [name] Other Clinician: Referring Britania Shreeve: Treating Ladonya Jerkins/Extender: [name] [name] [name] in Treatment: 0 Height (in): Weight (lbs): Body Mass Index (BMI): Nutrition Risk Screening Items Score Screening NUTRITION RISK SCREEN: I have an illness or condition that made me change the kind and/or amount of food I eat 2 Yes I eat fewer than two meals per day 0 No I eat few fruits and vegetables, or milk products 0 No I have three or more drinks of beer, liquor or wine almost every day 0 No I have tooth or mouth problems that make it hard for me to eat 0 No I don't always have enough money to buy the food I need 0 No I eat alone most of the time 0 No I take three or more different prescribed or over-the-counter drugs a day 1 Yes Without wanting to, I have lost or gained 10 pounds in the last six months 0 No I am not always physically able to shop, cook and/or feed myself 0 No Nutrition Protocols Good Risk Protocol Provide education on elevated blood Moderate Risk Protocol 0 sugars and impact on wound healing, as applicable High Risk Proctocol Risk Level: Moderate  Risk Score: 3 Electronic Signature(s) Signed: 09/12/2023 4:41:36 PM By: [name] Nestle RN, BSN Previous Signature: 09/12/2023 9:29:26 AM Version By: Saine, Jodie Entered By: [name] on 09/12/2023 10:19:00

## 2023-09-12 NOTE — Progress Notes (Addendum)
 [name], Leslie R ([id number]) [id number] Page 1 of 12 Visit Report for 09/12/2023 Allergy List Details Patient Name: Date of Service: [name], [name] 09/12/2023 10:00 A M Medical Record Number: [id number] Patient Account Number: [id number] Date of Birth/Sex: Treating RN: Oct 23, 1940 (82 y.o. F) Primary Care [name]: [name] Other Clinician: Referring [name]: Treating [name]/Extender: Leslie Delon Leslie, [name] in Treatment: 0 Allergies Active Allergies oxycodone  HCl Reaction: nausea Severity: Moderate Cipro  Severity: Mild doxycycline  Reaction: nausea Severity: Mild Percocet Reaction: nausea Severity: Moderate tramadol  Reaction: nausea Severity: Moderate Allergy Notes Electronic Signature(s) Signed: 09/12/2023 4:41:36 PM By: Leslie Nestle RN, BSN Previous Signature: 09/12/2023 9:22:50 AM Version By: Leslie, [name] Entered By: [name] on 09/12/2023 07:13:08 -------------------------------------------------------------------------------- Arrival Information Details Patient Name: Date of Service: [name] 09/12/2023 10:00 A M Medical Record Number: [id number] Patient Account Number: [id number] Date of Birth/Sex: Treating RN: 1940/09/26 (82 y.o. [name] Primary Care [name]: [name] Other Clinician: Referring [name]: Treating [name]/Extender: [name] in Treatment: 0 Visit Information Patient Arrived: Ambulatory Arrival Time: 10:11 Accompanied By: husband Transfer Assistance: None Patient Identification Verified: Yes Secondary Verification Process Completed: Yes Patient Requires Transmission-Based Precautions: No Patient Has Alerts: Yes Patient Alerts: Patient on Blood Thinner [name], Leslie R ([id number]) [phone].pdf Page 2 of 12 Plavix  Electronic Signature(s) Signed: 09/12/2023 4:41:36 PM By: Leslie Nestle RN, BSN Entered By: [name] on  09/12/2023 07:12:52 -------------------------------------------------------------------------------- Clinic Level of Care Assessment Details Patient Name: Date of Service: [name], [name] 09/12/2023 10:00 A M Medical Record Number: [id number] Patient Account Number: [id number] Date of Birth/Sex: Treating RN: July 22, 1941 (82 y.o. [name] Primary Care Kamyla [name]: [name] Other Clinician: Referring [name]: Treating [name]/Extender: [name] in Treatment: 0 Clinic Level of Care Assessment Items TOOL 2 Quantity Score X- 1 0 Use when only an EandM is performed on the INITIAL visit ASSESSMENTS - Nursing Assessment / Reassessment X- 1 20 General Physical Exam (combine w/ comprehensive assessment (listed just below) when performed on new pt. evals) X- 1 25 Comprehensive Assessment (HX, ROS, Risk Assessments, Wounds Hx, etc.) ASSESSMENTS - Wound and Skin A ssessment / Reassessment []  - 0 Simple Wound Assessment / Reassessment - one wound X- 2 5 Complex Wound Assessment / Reassessment - multiple wounds []  - 0 Dermatologic / Skin Assessment (not related to wound area) ASSESSMENTS - Ostomy and/or Continence Assessment and Care []  - 0 Incontinence Assessment and Management []  - 0 Ostomy Care Assessment and Management (repouching, etc.) PROCESS - Coordination of Care []  - 0 Simple Patient / Family Education for ongoing care X- 1 20 Complex (extensive) Patient / Family Education for ongoing care X- 1 10 Staff obtains Chiropractor, Records, T Results / Process Orders est []  - 0 Staff telephones HHA, Nursing Homes / Clarify orders / etc []  - 0 Routine Transfer to another Facility (non-emergent condition) []  - 0 Routine Hospital Admission (non-emergent condition) []  - 0 New Admissions / Manufacturing Engineer / Ordering NPWT Apligraf, etc. , []  - 0 Emergency Hospital Admission (emergent condition) []  - 0 Simple Discharge Coordination X- 1  15 Complex (extensive) Discharge Coordination PROCESS - Special Needs []  - 0 Pediatric / Minor Patient Management []  - 0 Isolation Patient Management []  - 0 Hearing / Language / Visual special needs []  - 0 Assessment of Community assistance (transportation, D/C planning, etc.) []  - 0 Additional assistance / Altered mentation []  - 0 Support Surface(s) Assessment (bed, cushion, seat, etc.) [name], Leslie R ([id number]) [phone].pdf Page 3 of  12 INTERVENTIONS - Wound Cleansing / Measurement X- 1 5 Wound Imaging (photographs - any number of wounds) []  - 0 Wound Tracing (instead of photographs) []  - 0 Simple Wound Measurement - one wound X- 2 5 Complex Wound Measurement - multiple wounds []  - 0 Simple Wound Cleansing - one wound X- 2 5 Complex Wound Cleansing - multiple wounds INTERVENTIONS - Wound Dressings []  - 0 Small Wound Dressing one or multiple wounds []  - 0 Medium Wound Dressing one or multiple wounds X- 1 20 Large Wound Dressing one or multiple wounds []  - 0 Application of Medications - injection INTERVENTIONS - Miscellaneous []  - 0 External ear exam []  - 0 Specimen Collection (cultures, biopsies, blood, body fluids, etc.) []  - 0 Specimen(s) / Culture(s) sent or taken to Lab for analysis []  - 0 Patient Transfer (multiple staff / Nurse, Adult / Similar devices) []  - 0 Simple Staple / Suture removal (25 or less) []  - 0 Complex Staple / Suture removal (26 or more) []  - 0 Hypo / Hyperglycemic Management (close monitor of Blood Glucose) []  - 0 Ankle / Brachial Index (ABI) - do not check if billed separately Has the patient been seen at the hospital within the last three years: Yes Total Score: 145 Level Of Care: New/Established - Level 4 Electronic Signature(s) Signed: 09/12/2023 4:41:36 PM By: Leslie Nestle RN, BSN Entered By: [name] on 09/12/2023  07:40:35 -------------------------------------------------------------------------------- Encounter Discharge Information Details Patient Name: Date of Service: [name] 09/12/2023 10:00 A M Medical Record Number: [id number] Patient Account Number: [id number] Date of Birth/Sex: Treating RN: 09-May-1941 (82 y.o. [name] Primary Care Kolina Kube: [name] Other Clinician: Referring Yaqueline Gutter: Treating Jaremy Nosal/Extender: [name] in Treatment: 0 Encounter Discharge Information Items Post Procedure Vitals Discharge Condition: Stable Temperature (F): 97.9 Ambulatory Status: Ambulatory Pulse (bpm): 80 Discharge Destination: Home Respiratory Rate (breaths/min): 20 Transportation: Private Auto Blood Pressure (mmHg): 160/72 Accompanied By: husband Schedule Follow-up Appointment: Yes Clinical Summary of Care: Electronic Signature(s) Signed: 09/12/2023 4:41:36 PM By: Leslie Nestle RN, BSN Entered By: [name] on 09/12/2023 07:41:17 Mendel, JOAN SAUNDERS ([id number]) [id number] Page 4 of 12 -------------------------------------------------------------------------------- Lower Extremity Assessment Details Patient Name: Date of Service: Leslie, [name] 09/12/2023 10:00 A M Medical Record Number: [id number] Patient Account Number: [id number] Date of Birth/Sex: Treating RN: 11-12-1940 (82 y.o. F) Primary Care Claudell Wohler: [name] Other Clinician: Referring Okema Rollinson: Treating Destiny Trickey/Extender: [name] in Treatment: 0 Notes NO BLE wounds. Electronic Signature(s) Signed: 09/12/2023 4:41:36 PM By: Leslie Nestle RN, BSN Previous Signature: 09/12/2023 9:29:38 AM Version By: Leslie, [name] Entered By: [name] on 09/12/2023 07:19:25 -------------------------------------------------------------------------------- Multi Wound Chart Details Patient Name: Date of Service: [name] 09/12/2023 10:00  A M Medical Record Number: [id number] Patient Account Number: [id number] Date of Birth/Sex: Treating RN: 06-05-1941 (82 y.o. F) Primary Care Diedre Maclellan: [name] Other Clinician: Referring Chalet Kerwin: Treating Francely Craw/Extender: [name] in Treatment: 0 Vital Signs Height(in): 65 Pulse(bpm): 80 Weight(lbs): 148 Blood Pressure(mmHg): 160/72 Body Mass Index(BMI): 24.6 Temperature(F): 97.9 Respiratory Rate(breaths/min): 20 [1:Photos:] [N/A:N/A] Left Hand - Dorsum Left Forearm N/A Wound Location: Trauma Trauma N/A Wounding Event: Skin Tear Abrasion N/A Primary Etiology: Asthma, Congestive Heart Failure, Asthma, Congestive Heart Failure, N/A Comorbid History: Hypertension, Type II Diabetes Hypertension, Type II Diabetes 09/10/2023 09/10/2023 N/A Date Acquired: 0 0 N/A [name] of Treatment: Open Open N/A Wound Status: No No N/A Wound Recurrence: 3x2.5x0.1 17x4.5x0.1 N/A Measurements L x W x D (cm) 5.89 60.083  N/A A (cm) : rea 0.589 6.008 N/A Volume (cm) : Full Thickness Without Exposed Full Thickness Without Exposed N/A Classification: Support Structures Support Structures Medium Medium N/A Exudate Amount: Serosanguineous Serosanguineous N/A Exudate Type: red, brown red, brown N/A Exudate Color: Distinct, outline attached Distinct, outline attached N/A Wound Margin: [name], Leslie R ([id number]) [phone] Page 5 of 12 Large (67-100%) Large (67-100%) N/A Granulation Amount: Red Red N/A Granulation Quality: None Present (0%) None Present (0%) N/A Necrotic Amount: Fat Layer (Subcutaneous Tissue): Yes Fat Layer (Subcutaneous Tissue): Yes N/A Exposed Structures: Fascia: No Fascia: No Tendon: No Tendon: No Muscle: No Muscle: No Joint: No Joint: No Bone: No Bone: No None None N/A Epithelialization: Chemical/Enzymatic/Mechanical Chemical/Enzymatic/Mechanical N/A Debridement: N/A N/A N/A Instrument: None None  N/A Bleeding: Debridement Treatment Response: Procedure was tolerated well Procedure was tolerated well N/A Post Debridement Measurements L x 3x2.5x0.1 17x4.5x0.1 N/A W x D (cm) 0.589 6.008 N/A Post Debridement Volume: (cm) Excoriation: No Excoriation: No N/A Periwound Skin Texture: Induration: No Induration: No Callus: No Callus: No Crepitus: No Crepitus: No Rash: No Rash: No Scarring: No Scarring: No Maceration: No Maceration: No N/A Periwound Skin Moisture: Dry/Scaly: No Dry/Scaly: No Atrophie Blanche: No Atrophie Blanche: No N/A Periwound Skin Color: Cyanosis: No Cyanosis: No Ecchymosis: No Ecchymosis: No Erythema: No Erythema: No Hemosiderin Staining: No Hemosiderin Staining: No Mottled: No Mottled: No Pallor: No Pallor: No Rubor: No Rubor: No No Abnormality No Abnormality N/A Temperature: N/A x3 sutures to left elbow. N/A Assessment Notes: Debridement Debridement N/A Procedures Performed: Treatment Notes Wound #1 (Hand - Dorsum) Wound Laterality: Left Cleanser Soap and Water Discharge Instruction: May shower and wash wound with dial antibacterial soap and water prior to dressing change. Peri-Wound Care Topical Primary Dressing Xeroform Occlusive Gauze Dressing, 4x4 in Discharge Instruction: Apply to wound bed as instructed Secondary Dressing Woven Gauze Sponge, Non-Sterile 4x4 in Discharge Instruction: Apply over primary dressing as directed. Secured With [employer], 4.5x3.1 (in/yd) Discharge Instruction: Secure with Kerlix as directed. 4M Medipore H Soft Cloth Surgical T ape, 4 x 10 (in/yd) Discharge Instruction: Secure with tape as directed. Elastic Net Retention Dressing, Size 5, 12x25 (in/yd) Discharge Instruction: Apply over the kerlix to secure in place. Compression Wrap Compression Stockings Add-Ons Wound #2 (Forearm) Wound Laterality: Left Cleanser Soap and Water Discharge Instruction: May shower and wash wound with dial  antibacterial soap and water prior to dressing change. Peri-Wound Care Topical [name], Leslie R ([id number]) [phone].pdf Page 6 of 12 Primary Dressing Xeroform Occlusive Gauze Dressing, 4x4 in Discharge Instruction: Apply to wound bed as instructed Secondary Dressing Woven Gauze Sponge, Non-Sterile 4x4 in Discharge Instruction: Apply over primary dressing as directed. Secured With [employer], 4.5x3.1 (in/yd) Discharge Instruction: Secure with Kerlix as directed. 4M Medipore H Soft Cloth Surgical T ape, 4 x 10 (in/yd) Discharge Instruction: Secure with tape as directed. Elastic Net Retention Dressing, Size 5, 12x25 (in/yd) Discharge Instruction: Apply over the kerlix to secure in place. Compression Wrap Compression Stockings Add-Ons Electronic Signature(s) Signed: 09/12/2023 12:57:02 PM By: Leslie Nest MD FACS Entered By: Leslie Nest on 09/12/2023 08:53:42 -------------------------------------------------------------------------------- Multi-Disciplinary Care Plan Details Patient Name: Date of Service: [name] 09/12/2023 10:00 A M Medical Record Number: [id number] Patient Account Number: [id number] Date of Birth/Sex: Treating RN: 05/19/1941 (82 y.o. [name] Primary Care Madisen Ludvigsen: [name] Other Clinician: Referring Tashera Montalvo: Treating Keelynn Furgerson/Extender: Leslie Nest [name] [name] in Treatment: 0 Active Inactive Orientation to the Wound Care Program Nursing Diagnoses: Knowledge deficit related to the  wound healing center program Goals: Patient/caregiver will verbalize understanding of the Wound Healing Center Program Date Initiated: 09/12/2023 Target Resolution Date: 09/15/2023 Goal Status: Active Interventions: Provide education on orientation to the wound center Notes: Pain, Acute or Chronic Nursing Diagnoses: Pain, acute or chronic: actual or potential Goals: Patient will verbalize adequate pain  control and receive pain control interventions during procedures as needed Date Initiated: 09/12/2023 Target Resolution Date: 12/14/2023 Goal Status: Active Patient/caregiver will verbalize comfort level met Date Initiated: 09/12/2023 Target Resolution Date: 10/20/2023 Goal Status: Active JOCHEBED, BILLS R ([id number]) [id number] Page 7 of 12 Interventions: Encourage patient to take pain medications as prescribed Provide education on pain management Reposition patient for comfort Treatment Activities: Administer pain control measures as ordered : 09/12/2023 Notes: Wound/Skin Impairment Nursing Diagnoses: Knowledge deficit related to ulceration/compromised skin integrity Goals: Patient/caregiver will verbalize understanding of skin care regimen Date Initiated: 09/12/2023 Target Resolution Date: 09/15/2023 Goal Status: Active Interventions: Assess patient/caregiver ability to perform ulcer/skin care regimen upon admission and as needed Assess ulceration(s) every visit Provide education on ulcer and skin care Treatment Activities: Skin care regimen initiated : 09/12/2023 Topical wound management initiated : 09/12/2023 Notes: Electronic Signature(s) Signed: 09/12/2023 4:41:36 PM By: Leslie Nestle RN, BSN Entered By: [name] on 09/12/2023 07:26:22 -------------------------------------------------------------------------------- Pain Assessment Details Patient Name: Date of Service: [name] 09/12/2023 10:00 A M Medical Record Number: [id number] Patient Account Number: [id number] Date of Birth/Sex: Treating RN: Jun 27, 1941 (82 y.o. [name] Primary Care Mycah Mcdougall: [name] Other Clinician: Referring Stormy Sabol: Treating Aeon Kessner/Extender: [name] in Treatment: 0 Active Problems Location of Pain Severity and Description of Pain Patient Has Paino No Site Locations Rate the pain. Current Pain Level: 0 [name], Leslie  R ([id number]) [phone].pdf Page 8 of 12 Pain Management and Medication Current Pain Management: Medication: No Cold Application: No Rest: No Massage: No Activity: No T.E.N.S.: No Heat Application: No Leg drop or elevation: No Is the Current Pain Management Adequate: Adequate How does your wound impact your activities of daily livingo Sleep: No Bathing: No Appetite: No Relationship With Others: No Bladder Continence: No Emotions: No Bowel Continence: No Work: No Toileting: No Drive: No Dressing: No Hobbies: No Notes per patient at times pain 0/10. Electronic Signature(s) Signed: 09/12/2023 4:41:36 PM By: Leslie Nestle RN, BSN Entered By: [name] on 09/12/2023 07:22:34 -------------------------------------------------------------------------------- Patient/Caregiver Education Details Patient Name: Date of Service: [name] 12/31/2024andnbsp10:00 A M Medical Record Number: [id number] Patient Account Number: [id number] Date of Birth/Gender: Treating RN: [birth year] (82 y.o. [name] Primary Care Physician: [name] Other Clinician: Referring Physician: Treating Physician/Extender: [name] in Treatment: 0 Education Assessment Education Provided To: Patient and Caregiver husband Education Topics Provided Welcome [facility] Packet: o Handouts: Welcome [facility] o Methods: Explain/Verbal Responses: Reinforcements needed Electronic Signature(s) Signed: 09/12/2023 4:41:36 PM By: Leslie Nestle RN, BSN Entered By: [name] on 09/12/2023 07:26:34 -------------------------------------------------------------------------------- Wound Assessment Details Patient Name: Date of Service: [name] 09/12/2023 10:00 A M Medical Record Number: [id number] Patient Account Number: [id number] Date of Birth/Sex: Treating RN: 1941-06-19 (82 y.o. [name] Primary Care Jaycion Treml: [name] Other Clinician: Referring Jamarkis Branam: Treating Shirla Hodgkiss/Extender: [name] in TreatmentJuhi Lagrange, MOTT SAUNDERS ([id number]) [id number] Page 9 of 12 Wound Status Wound Number: 1 Primary Skin Tear Etiology: Wound Location: Left Hand - Dorsum Wound Status: Open Wounding Event: Trauma Comorbid Asthma, Congestive Heart Failure, Hypertension, Type II  Date Acquired: 09/10/2023 History: Diabetes [name] Of Treatment: 0 Clustered Wound: No Photos Wound Measurements Length: (cm) 3 Width: (cm) 2.5 Depth: (cm) 0.1 Area: (cm) 5.89 Volume: (cm) 0.589 % Reduction in Area: % Reduction in Volume: Epithelialization: None Tunneling: No Undermining: No Wound Description Classification: Full Thickness Without Exposed Support Structures Wound Margin: Distinct, outline attached Exudate Amount: Medium Exudate Type: Serosanguineous Exudate Color: red, brown Foul Odor After Cleansing: No Slough/Fibrino No Wound Bed Granulation Amount: Large (67-100%) Exposed Structure Granulation Quality: Red Fascia Exposed: No Necrotic Amount: None Present (0%) Fat Layer (Subcutaneous Tissue) Exposed: Yes Tendon Exposed: No Muscle Exposed: No Joint Exposed: No Bone Exposed: No Periwound Skin Texture Texture Color No Abnormalities Noted: No No Abnormalities Noted: No Callus: No Atrophie Blanche: No Crepitus: No Cyanosis: No Excoriation: No Ecchymosis: No Induration: No Erythema: No Rash: No Hemosiderin Staining: No Scarring: No Mottled: No Pallor: No Moisture Rubor: No No Abnormalities Noted: No Dry / Scaly: No Temperature / Pain Maceration: No Temperature: No Abnormality Treatment Notes Wound #1 (Hand - Dorsum) Wound Laterality: Left Cleanser Soap and Water Discharge Instruction: May shower and wash wound with dial antibacterial soap and water prior to dressing change. Peri-Wound  Care Topical Primary Dressing Xeroform Occlusive Gauze Dressing, 4x4 in [name], Leslie R ([id number]) [phone].pdf Page 10 of 12 Discharge Instruction: Apply to wound bed as instructed Secondary Dressing Woven Gauze Sponge, Non-Sterile 4x4 in Discharge Instruction: Apply over primary dressing as directed. Secured With [employer], 4.5x3.1 (in/yd) Discharge Instruction: Secure with Kerlix as directed. 37M Medipore H Soft Cloth Surgical T ape, 4 x 10 (in/yd) Discharge Instruction: Secure with tape as directed. Elastic Net Retention Dressing, Size 5, 12x25 (in/yd) Discharge Instruction: Apply over the kerlix to secure in place. Compression Wrap Compression Stockings Add-Ons Electronic Signature(s) Signed: 09/12/2023 4:41:36 PM By: Leslie Nestle RN, BSN Entered By: [name] on 09/12/2023 07:21:58 -------------------------------------------------------------------------------- Wound Assessment Details Patient Name: Date of Service: Kutter, Genifer R. 09/12/2023 10:00 A M Medical Record Number: [id number] Patient Account Number: [id number] Date of Birth/Sex: Treating RN: Jan 17, 1941 (82 y.o. Leslie Leslie, Geminus.gell Primary Care Mckenzie Bove: [name] Other Clinician: Referring Naeem Quillin: Treating Alexa Golebiewski/Extender: [name] in Treatment: 0 Wound Status Wound Number: 2 Primary Abrasion Etiology: Wound Location: Left Forearm Wound Status: Open Wounding Event: Trauma Comorbid Asthma, Congestive Heart Failure, Hypertension, Type II Date Acquired: 09/10/2023 History: Diabetes [name] Of Treatment: 0 Clustered Wound: No Photos Wound Measurements Length: (cm) 17 Width: (cm) 4.5 Depth: (cm) 0.1 Area: (cm) 60.083 Volume: (cm) 6.008 % Reduction in Area: % Reduction in Volume: Epithelialization: None Tunneling: No Undermining: No Wound Description Classification: Full Thickness Without Exposed Support Structures Wound Margin:  Distinct, outline attached Exudate Amount: Medium [name], Leslie R ([id number]) Exudate Type: Serosanguineous Exudate Color: red, brown Foul Odor After Cleansing: No Slough/Fibrino No [phone].pdf Page 11 of 12 Wound Bed Granulation Amount: Large (67-100%) Exposed Structure Granulation Quality: Red Fascia Exposed: No Necrotic Amount: None Present (0%) Fat Layer (Subcutaneous Tissue) Exposed: Yes Tendon Exposed: No Muscle Exposed: No Joint Exposed: No Bone Exposed: No Periwound Skin Texture Texture Color No Abnormalities Noted: No No Abnormalities Noted: No Callus: No Atrophie Blanche: No Crepitus: No Cyanosis: No Excoriation: No Ecchymosis: No Induration: No Erythema: No Rash: No Hemosiderin Staining: No Scarring: No Mottled: No Pallor: No Moisture Rubor: No No Abnormalities Noted: No Dry / Scaly: No Temperature / Pain Maceration: No Temperature: No Abnormality Assessment Notes x3 sutures to left elbow. Treatment Notes Wound #2 (Forearm) Wound Laterality: Left Cleanser  Soap and Water Discharge Instruction: May shower and wash wound with dial antibacterial soap and water prior to dressing change. Peri-Wound Care Topical Primary Dressing Xeroform Occlusive Gauze Dressing, 4x4 in Discharge Instruction: Apply to wound bed as instructed Secondary Dressing Woven Gauze Sponge, Non-Sterile 4x4 in Discharge Instruction: Apply over primary dressing as directed. Secured With [employer], 4.5x3.1 (in/yd) Discharge Instruction: Secure with Kerlix as directed. 4M Medipore H Soft Cloth Surgical T ape, 4 x 10 (in/yd) Discharge Instruction: Secure with tape as directed. Elastic Net Retention Dressing, Size 5, 12x25 (in/yd) Discharge Instruction: Apply over the kerlix to secure in place. Compression Wrap Compression Stockings Add-Ons Electronic Signature(s) Signed: 09/12/2023 4:41:36 PM By: Leslie Nestle RN, BSN Entered By: [name]  on 09/12/2023 07:24:28 Leslie JOAN SAUNDERS ([id number]) [id number] Page 12 of 12 -------------------------------------------------------------------------------- Vitals Details Patient Name: Date of Service: Naas, Maleiyah R. 09/12/2023 10:00 A M Medical Record Number: [id number] Patient Account Number: [id number] Date of Birth/Sex: Treating RN: [birth year] (82 y.o. Leslie Leslie, Geminus.gell Primary Care Jaquez Farrington: [name] Other Clinician: Referring Daaiyah Baumert: Treating Peri Kreft/Extender: [name] in Treatment: 0 Vital Signs Time Taken: 10:23 Temperature (F): 97.9 Height (in): 65 Pulse (bpm): 80 Source: Stated Respiratory Rate (breaths/min): 20 Weight (lbs): 148 Blood Pressure (mmHg): 160/72 Source: Stated Reference Range: 80 - 120 mg / dl Body Mass Index (BMI): 24.6 Electronic Signature(s) Signed: 09/12/2023 4:41:36 PM By: Leslie Nestle RN, BSN Entered By: [name] on 09/12/2023 07:23:59

## 2023-09-13 NOTE — Progress Notes (Signed)
 Burston, Khyleigh R ([id number]) [id number] Page 1 of 9 Visit Report for 09/12/2023 Chief Complaint Document Details Patient Name: Date of Service: [name] 09/12/2023 10:00 A M Medical Record Number: [id number] Patient Account Number: [id number] Date of Birth/Sex: Treating RN: 06-Dec-1940 (83 y.o. F) Primary Care Provider: Leonel Cole Other Clinician: Referring Provider: Treating Provider/Extender: Marolyn Delon Leonel Cole Devra in Treatment: 0 Information Obtained from: Patient Chief Complaint Patient seen for complaints of Non-Healing Wound. Electronic Signature(s) Signed: 09/12/2023 12:57:02 PM By: Marolyn Delon MD FACS Entered By: Marolyn Delon on 09/12/2023 11:53:48 -------------------------------------------------------------------------------- Debridement Details Patient Name: Date of Service: [name] 09/12/2023 10:00 A M Medical Record Number: [id number] Patient Account Number: [id number] Date of Birth/Sex: Treating RN: 07/13/1941 (83 y.o. [name] Primary Care Provider: Leonel Cole Other Clinician: Referring Provider: Treating Provider/Extender: Marolyn Delon Leonel Cole Devra in Treatment: 0 Debridement Performed for Assessment: Wound #1 Left Hand - Dorsum Performed By: Clinician Drury Nestle, RN Debridement Type: Chemical/Enzymatic/Mechanical Agent Used: wound cleanser and gauze Level of Consciousness (Pre-procedure): Awake and Alert Pre-procedure Verification/Time Out No Taken: Percent of Wound Bed Debrided: Bleeding: None Response to Treatment: Procedure was tolerated well Level of Consciousness (Post- Awake and Alert procedure): Post Debridement Measurements of Total Wound Length: (cm) 3 Width: (cm) 2.5 Depth: (cm) 0.1 Volume: (cm) 0.589 Character of Wound/Ulcer Post Debridement: Stable Post Procedure Diagnosis Same as Pre-procedure Electronic Signature(s) Signed: 09/12/2023 12:57:02 PM By: Marolyn Delon MD FACS Signed: 09/12/2023 4:41:36 PM By: Drury Nestle RN, BSN Entered By: Drury [name] on 09/12/2023 10:34:41 [name] ([id number]) [id number] Page 2 of 9 -------------------------------------------------------------------------------- Debridement Details Patient Name: Date of Service: [name], [name] 09/12/2023 10:00 A M Medical Record Number: [id number] Patient Account Number: [id number] Date of Birth/Sex: Treating RN: 29-Dec-1940 (83 y.o. [name] Primary Care Provider: Leonel Cole Other Clinician: Referring Provider: Treating Provider/Extender: Marolyn Delon Leonel Cole Devra in Treatment: 0 Debridement Performed for Assessment: Wound #2 Left Forearm Performed By: Clinician Drury Nestle, RN Debridement Type: Chemical/Enzymatic/Mechanical Agent Used: wound cleanser and gauze Level of Consciousness (Pre-procedure): Awake and Alert Pre-procedure Verification/Time Out No Taken: Percent of Wound Bed Debrided: Bleeding: None Response to Treatment: Procedure was tolerated well Level of Consciousness (Post- Awake and Alert procedure): Post Debridement Measurements of Total Wound Length: (cm) 17 Width: (cm) 4.5 Depth: (cm) 0.1 Volume: (cm) 6.008 Character of Wound/Ulcer Post Debridement: Stable Post Procedure Diagnosis Same as Pre-procedure Electronic Signature(s) Signed: 09/12/2023 12:57:02 PM By: Marolyn Delon MD FACS Signed: 09/12/2023 4:41:36 PM By: Drury Nestle RN, BSN Entered By: Drury [name] on 09/12/2023 10:36:20 -------------------------------------------------------------------------------- HPI Details Patient Name: Date of Service: [name] 09/12/2023 10:00 A M Medical Record Number: [id number] Patient Account Number: [id number] Date of Birth/Sex: Treating RN: 03/10/1941 (83 y.o. F) Primary Care Provider: Leonel Cole Other Clinician: Referring Provider: Treating Provider/Extender: Marolyn Delon Leonel Cole Devra in Treatment: 0 History of Present Illness HPI Description: ADMISSION 09/12/2023 This is an 83 year old woman with a history of poorly controlled type 2 diabetes (last available hemoglobin A1c 10.4%, about 1 year ago). She suffered a fall that resulted in a large skin tear on her arm and dorsal hand. She was referred to the wound care center for further evaluation and management of the wound. Electronic Signature(s) Signed: 09/12/2023 12:57:02 PM By: Marolyn Delon MD FACS Entered By: Marolyn Delon on 09/12/2023 11:54:05 [name] ([id number]) [id number] Page 3 of 9 -------------------------------------------------------------------------------- Physical Exam Details Patient Name: Date of Service: [name], [name] 09/12/2023 10:00 A  M Medical Record Number: [id number] Patient Account Number: [id number] Date of Birth/Sex: Treating RN: 03/18/1941 (83 y.o. F) Primary Care Provider: Leonel Cole Other Clinician: Referring Provider: Treating Provider/Extender: Marolyn Delon Leonel Cole Devra in Treatment: 0 Constitutional Hypertensive, asymptomatic. . . . No acute distress. Respiratory Normal work of breathing on room air. Notes 09/12/2023: She has a skin tear on the dorsum of her left hand as well as a series of jagged openings along the dorsal aspect of her left arm from the elbow down. They are contiguous. Some of the skin appears to be quite well attached but other areas are a bit more tenuous. She has a couple of sutures at the elbow. There is no erythema, induration, malodor, or purulence to suggest infection. Electronic Signature(s) Signed: 09/12/2023 12:57:02 PM By: Marolyn Delon MD FACS Entered By: Marolyn Delon on 09/12/2023 11:55:46 -------------------------------------------------------------------------------- Physician Orders Details Patient Name: Date of Service: [name] 09/12/2023 10:00 A  M Medical Record Number: [id number] Patient Account Number: [id number] Date of Birth/Sex: Treating RN: 10/14/1940 (83 y.o. 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 Marolyn next appointment (front office to schedule) Return A Other: - [employer] Anesthetic (In clinic) Topical Lidocaine  5% applied to wound bed Bathing/ Shower/ Hygiene May shower and wash wound with soap and water. - gently wash with soap and water when changing the dressing. Edema Control - Orders / Instructions Other Edema Control Orders/Instructions: - elevate the left hand and arm when sitting in chair to aid in swelling. Wound Treatment Wound #1 - Hand - Dorsum Wound Laterality: Left Cleanser: Soap and Water Every Other Day/30 Days Feggins, Rhandi R ([id number]) [id number].pdf Page 4 of 9 Discharge Instructions: May shower and wash wound with dial antibacterial soap and water prior to dressing change. Prim Dressing: Xeroform Occlusive Gauze Dressing, 4x4 in (DME) (Generic) Every Other Day/30 Days ary Discharge Instructions: Apply to wound bed as instructed Secondary Dressing: Woven Gauze Sponge, Non-Sterile 4x4 in (DME) (Generic) Every Other Day/30 Days Discharge Instructions: Apply over primary dressing as directed. Secured With: [employer], 4.5x3.1 (in/yd) (DME) (Generic) Every Other Day/30 Days Discharge Instructions: Secure with  Kerlix as directed. Secured With: 68M Medipore H Soft Cloth Surgical T ape, 4 x 10 (in/yd) (DME) (Generic) Every Other Day/30 Days Discharge Instructions: Secure with tape as directed. Secured With: [employer]ist Dressing, Size 5, 12x25 (in/yd) (DME) (Generic) Every Other Day/30 Days Discharge Instructions: Apply over the kerlix to secure in place. Wound #2 - Forearm Wound Laterality: Left Cleanser: Soap and Water Every Other Day/30 Days Discharge Instructions: May shower and wash wound with dial antibacterial soap and water prior to dressing change. Prim Dressing: Xeroform Occlusive Gauze Dressing, 4x4 in (DME) (Generic) Every Other Day/30 Days ary Discharge Instructions: Apply to wound bed as instructed Secondary Dressing: Woven Gauze Sponge, Non-Sterile 4x4 in (DME) (Generic) Every Other Day/30 Days Discharge Instructions: Apply over primary dressing as directed. Secured With: [employer], 4.5x3.1 (in/yd) (DME) (Generic) Every Other Day/30 Days Discharge Instructions: Secure with Kerlix as directed. Secured With: 68M Medipore  H Soft Cloth Surgical T ape, 4 x 10 (in/yd) (DME) (Generic) Every Other Day/30 Days Discharge Instructions: Secure with tape as directed. Secured With: [employer] Dressing, Size 5, 12x25 (in/yd) (DME) (Generic) Every Other Day/30 Days Discharge Instructions: Apply over the kerlix to secure in place. Patient Medications llergies: oxycodone  HCl, Percocet, tramadol , Cipro , doxycycline  A Notifications Medication Indication Start End applied only in clinic for12/31/2024 lidocaine  debridement. DOSE topical 5 % cream - cream topical Electronic Signature(s) Signed: 09/12/2023 12:57:02 PM By: Marolyn Nest MD FACS Entered By: Marolyn Nest on 09/12/2023 11:56:02 -------------------------------------------------------------------------------- Problem List Details Patient Name: Date of Service: [name] 09/12/2023 10:00 A M Medical  Record Number: [id number] Patient Account Number: [id number] Date of Birth/Sex: Treating RN: 10/04/1940 (83 y.o. 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 09/12/2023 No Yes Zamor, Kumiko R ([id number]) [phone].pdf Page 5 of 9 E11.622 Type 2 diabetes mellitus with other skin ulcer 09/12/2023 No Yes I50.32 Chronic diastolic (congestive) heart failure 09/12/2023 No Yes Inactive Problems Resolved Problems Electronic Signature(s) Signed: 09/12/2023 12:57:02 PM By: Marolyn Nest MD FACS Entered By: Marolyn Nest on 09/12/2023 11:53:35 -------------------------------------------------------------------------------- Progress Note/History and Physical Details Patient Name: Date of Service: [name] 09/12/2023 10:00 A M Medical Record Number: [id number] Patient Account Number: [id number] Date of Birth/Sex: Treating RN: 1941/01/08 (83 y.o. F) Primary Care Provider: Leonel Cole Other Clinician: Referring Provider: Treating Provider/Extender: Marolyn Nest Leonel Cole Devra in Treatment: 0 Subjective Chief Complaint Information obtained from Patient Patient seen for complaints of Non-Healing Wound. History of Present Illness (HPI) ADMISSION 09/12/2023 This is an 83 year old woman with a history of poorly controlled type 2 diabetes (last available hemoglobin A1c 10.4%, about 1 year ago). She suffered a fall that resulted in a large skin tear on her arm and dorsal hand. She was referred to the wound care center for further evaluation and management of the wound. Patient History Information obtained from Patient, Chart. Allergies oxycodone  HCl (Severity: Moderate, Reaction: nausea),  Cipro  (Severity: Mild), doxycycline  (Severity: Mild, Reaction: nausea), Percocet (Severity: Moderate, Reaction: nausea), tramadol  (Severity: Moderate, Reaction: nausea) Family History Cancer - Siblings, Diabetes - Siblings, Heart Disease - Siblings,Father,Mother, No family history of Hereditary Spherocytosis, Hypertension, Kidney Disease, Lung Disease, Seizures, Stroke, Thyroid  Problems, Tuberculosis. Social History Never smoker, Marital Status - Married, Alcohol Use - Never, Drug Use - No History, Caffeine  Use - Never. Medical History Respiratory Patient has history of Asthma - 2024 Cardiovascular Patient has history of Congestive Heart Failure - 2023, Hypertension - 2023 Endocrine Patient has history of Type II Diabetes - 2024- Jardiance  Patient is treated with Controlled Diet. Blood sugar is not tested. Hospitalization/Surgery History - R and L heart cath 2020. - EGD 2015. - dehydration 2024. - SOB, bronchitis 2022. - Angina 2020. - CVA 2016. - Pancreatitis 2015. - right mastectomy. Medical A Surgical History Notes nd Cardiovascular CVA 2016 Oncologic SHAWNIA, VIZCARRONDO ([id number]) [id number] Page 6 of 9 HO adneocarcinoma for the right breast- right mastectomy Objective Constitutional Hypertensive, asymptomatic. No acute distress. Vitals Time Taken: 10:23 AM, Height: 65 in, Source: Stated, Weight: 148 lbs, Source: Stated, BMI: 24.6, Temperature: 97.9 F, Pulse: 80 bpm, Respiratory Rate: 20 breaths/min, Blood Pressure: 160/72 mmHg. Respiratory Normal work of breathing on room air. General Notes: 09/12/2023: She has a skin tear on the dorsum  of her left hand as well as a series of jagged openings along the dorsal aspect of her left arm from the elbow down. They are contiguous. Some of the skin appears to be quite well attached but other areas are a bit more tenuous. She has a couple of sutures at the elbow. There is no erythema, induration, malodor, or  purulence to suggest infection. Integumentary (Hair, Skin) Wound #1 status is Open. Original cause of wound was Trauma. The date acquired was: 09/10/2023. The wound is located on the Left Hand - Dorsum. The wound measures 3cm length x 2.5cm width x 0.1cm depth; 5.89cm^2 area and 0.589cm^3 volume. There is Fat Layer (Subcutaneous Tissue) exposed. There is no tunneling or undermining noted. There is a medium amount of serosanguineous drainage noted. The wound margin is distinct with the outline attached to the wound base. There is large (67-100%) red granulation within the wound bed. There is no necrotic tissue within the wound bed. The periwound skin appearance did not exhibit: Callus, Crepitus, Excoriation, Induration, Rash, Scarring, Dry/Scaly, Maceration, Atrophie Blanche, Cyanosis, Ecchymosis, Hemosiderin Staining, Mottled, Pallor, Rubor, Erythema. Periwound temperature was noted as No Abnormality. Wound #2 status is Open. Original cause of wound was Trauma. The date acquired was: 09/10/2023. The wound is located on the Left Forearm. The wound measures 17cm length x 4.5cm width x 0.1cm depth; 60.083cm^2 area and 6.008cm^3 volume. There is Fat Layer (Subcutaneous Tissue) exposed. There is no tunneling or undermining noted. There is a medium amount of serosanguineous drainage noted. The wound margin is distinct with the outline attached to the wound base. There is large (67-100%) red granulation within the wound bed. There is no necrotic tissue within the wound bed. The periwound skin appearance did not exhibit: Callus, Crepitus, Excoriation, Induration, Rash, Scarring, Dry/Scaly, Maceration, Atrophie Blanche, Cyanosis, Ecchymosis, Hemosiderin Staining, Mottled, Pallor, Rubor, Erythema. Periwound temperature was noted as No Abnormality. General Notes: x3 sutures to left elbow. Assessment Active Problems ICD-10 Non-pressure chronic ulcer of skin of other sites with unspecified severity Type 2  diabetes mellitus with other skin ulcer Chronic diastolic (congestive) heart failure Procedures Wound #1 Pre-procedure diagnosis of Wound #1 is a Skin T located on the Left Hand - Dorsum . There was a Chemical/Enzymatic/Mechanical debridement performed ear by Drury Nestle, RN.. Other agent used was wound cleanser and gauze. There was no bleeding. The procedure was tolerated well. Post Debridement Measurements: 3cm length x 2.5cm width x 0.1cm depth; 0.589cm^3 volume. Character of Wound/Ulcer Post Debridement is stable. Post procedure Diagnosis Wound #1: Same as Pre-Procedure Wound #2 Pre-procedure diagnosis of Wound #2 is an Abrasion located on the Left Forearm . There was a Chemical/Enzymatic/Mechanical debridement performed by Drury Nestle, RN.. Other agent used was wound cleanser and gauze. There was no bleeding. The procedure was tolerated well. Post Debridement Measurements: 17cm length x 4.5cm width x 0.1cm depth; 6.008cm^3 volume. Character of Wound/Ulcer Post Debridement is stable. Post procedure Diagnosis Wound #2: Same as Pre-Procedure Plan Follow-up Appointments: CACIE, GASKINS ([id number]) [phone].pdf Page 7 of 9 Return Appointment in: - 10 days Dr. Marolyn next appointment (front office to schedule) Other: [employer] Anesthetic: (In clinic) Topical Lidocaine  5% applied to wound bed Bathing/ Shower/ Hygiene: May shower and wash wound with soap and water. - gently wash with soap and water when changing the dressing. Edema Control - Orders / Instructions: Other Edema Control Orders/Instructions: - elevate the left hand and arm when sitting in chair to aid in swelling. The following medication(s)  was prescribed: lidocaine  topical 5 % cream cream topical for applied only in clinic for debridement. was prescribed at facility WOUND #1: - Hand - Dorsum Wound Laterality: Left Cleanser: Soap and Water Every Other Day/30 Days Discharge  Instructions: May shower and wash wound with dial antibacterial soap and water prior to dressing change. Prim Dressing: Xeroform Occlusive Gauze Dressing, 4x4 in (DME) (Generic) Every Other Day/30 Days ary Discharge Instructions: Apply to wound bed as instructed Secondary Dressing: Woven Gauze Sponge, Non-Sterile 4x4 in (DME) (Generic) Every Other Day/30 Days Discharge Instructions: Apply over primary dressing as directed. Secured With: [employer], 4.5x3.1 (in/yd) (DME) (Generic) Every Other Day/30 Days Discharge Instructions: Secure with Kerlix as directed. Secured With: 44M Medipore H Soft Cloth Surgical T ape, 4 x 10 (in/yd) (DME) (Generic) Every Other Day/30 Days Discharge Instructions: Secure with tape as directed. Secured With: [employer]ist Dressing, Size 5, 12x25 (in/yd) (DME) (Generic) Every Other Day/30 Days Discharge Instructions: Apply over the kerlix to secure in place. WOUND #2: - Forearm Wound Laterality: Left Cleanser: Soap and Water Every Other Day/30 Days Discharge Instructions: May shower and wash wound with dial antibacterial soap and water prior to dressing change. Prim Dressing: Xeroform Occlusive Gauze Dressing, 4x4 in (DME) (Generic) Every Other Day/30 Days ary Discharge Instructions: Apply to wound bed as instructed Secondary Dressing: Woven Gauze Sponge, Non-Sterile 4x4 in (DME) (Generic) Every Other Day/30 Days Discharge Instructions: Apply over primary dressing as directed. Secured With: [employer], 4.5x3.1 (in/yd) (DME) (Generic) Every Other Day/30 Days Discharge Instructions: Secure with Kerlix as directed. Secured With: 44M Medipore H Soft Cloth Surgical T ape, 4 x 10 (in/yd) (DME) (Generic) Every Other Day/30 Days Discharge Instructions: Secure with tape as directed. Secured With: [employer]ist Dressing, Size 5, 12x25 (in/yd) (DME) (Generic) Every Other Day/30 Days Discharge Instructions: Apply over the kerlix to secure in  place. 09/12/2023: This is an 83 year old poorly controlled diabetic who fell down a flight of 10 stairs. She has a skin tear on the dorsum of her left hand as well as a series of jagged openings along the dorsal aspect of her left arm from the elbow down. They are contiguous. Some of the skin appears to be quite well attached but other areas are a bit more tenuous. She has a couple of sutures at the elbow. There is no erythema, induration, malodor, or purulence to suggest infection. No sharp debridement was required today. We will apply Xeroform to the open sites and wrap her hand and arm with Kerlix. I cautioned her that she may lose some of the skin that was disrupted by the skin tear. She will follow-up in about 1 week. Electronic Signature(s) Signed: 09/12/2023 12:57:02 PM By: Marolyn Nest MD FACS Entered By: Marolyn Nest on 09/12/2023 11:57:02 -------------------------------------------------------------------------------- HxROS Details Patient Name: Date of Service: [name] 09/12/2023 10:00 A M Medical Record Number: [id number] Patient Account Number: [id number] Date of Birth/Sex: Treating RN: 03/28/1941 (83 y.o. F) Primary Care Provider: Leonel Cole Other Clinician: Referring Provider: Treating Provider/Extender: Marolyn Nest Leonel Cole Devra in Treatment: 0 Label Progress Note Print Version as History and Physical for this encounter Information Obtained From Patient Chart Respiratory Medical History: Positive for: Asthma - 2024 Cardiovascular Medical History: Positive for: Congestive Heart Failure - 2023; Hypertension - 2023 Younker, Libby R ([id number]) [phone].pdf Page 8 of 9 Past Medical History Notes: CVA 2016 Endocrine Medical History: Positive for: Type II Diabetes - 2024- Jardiance  Time with diabetes: within a year Treated  with: Diet Blood sugar tested every day: No Oncologic Medical History: Past Medical History  Notes: HO adneocarcinoma for the right breast- right mastectomy Immunizations Pneumococcal Vaccine: Received Pneumococcal Vaccination: Yes Received Pneumococcal Vaccination On or After 60th Birthday: Yes Implantable Devices None Hospitalization / Surgery History Type of Hospitalization/Surgery R and L heart cath 2020 EGD 2015 dehydration 2024 SOB, bronchitis 2022 Angina 2020 CVA 2016 Pancreatitis 2015 right mastectomy Family and Social History Cancer: Yes - Siblings; Diabetes: Yes - Siblings; Heart Disease: Yes - Siblings,Father,Mother; Hereditary Spherocytosis: No; Hypertension: No; Kidney Disease: No; Lung Disease: No; Seizures: No; Stroke: No; Thyroid  Problems: No; Tuberculosis: No; Never smoker; Marital Status - Married; Alcohol Use: Never; Drug Use: No History; Caffeine  Use: Never Social Determinants of Health (SDOH) 1. In the past 2 months, did you or others you live with eat smaller meals or skip meals because you didn't have money for foodo : No 2. Are you homeless or worried that you might be in the futureo : No 3. Do you have trouble paying for your utilities (gas, electricity, phone)o : No 4. Do you have trouble finding or paying for a rideo : No 5. Do you need daycare, or better daycare, for your kidso : No 6. Are you unemployed or without regular incomeo : No 7. Do you need help finding a better jobo : No 8. Do you need help getting more educationo : No 9. Are you concerned about someone in your home using drugs or alcoholo : No 10. Do you feel unsafe in your daily lifeo : No 11. Is anyone in your home threatening or abusing youo : No 12. Do you lack quality relationships that make you feel valued and supportedo : No 13. Do you need help getting cultural information in a language you understando : No 14. Do you need help getting internet accesso : No Advanced Directives and Instructions Spiritual or Cultural beliefs preclude asking about Advance Care Planning:  No Advanced Directives: Yes Copy Provided: No Do not resuscitate: No Living Will: Yes Copy Provided: No Medical Power of Attorney: Yes Copy Provided: No Surrogate Decision Maker: No Electronic Signature(s) Signed: 09/12/2023 12:57:02 PM By: Marolyn Nest MD FACS Signed: 09/12/2023 4:41:36 PM By: Drury Nestle RN, BSN Entered By: Drury [name] on 09/12/2023 10:17:16 Davalos, [name] ([id number]) [id number] Page 9 of 9 -------------------------------------------------------------------------------- SuperBill Details Patient Name: Date of Service: DONIE, MOULTON 09/12/2023 Medical Record Number: [id number] Patient Account Number: [id number] Date of Birth/Sex: Treating RN: 12/10/1940 (83 y.o. [name] Primary Care Provider: Leonel Cole Other Clinician: Referring Provider: Treating Provider/Extender: Marolyn Nest Leonel Cole Devra in Treatment: 0 Diagnosis Coding ICD-10 Codes Code Description [phone] Non-pressure chronic ulcer of skin of other sites with unspecified severity E11.622 Type 2 diabetes mellitus with other skin ulcer I50.32 Chronic diastolic (congestive) [facility] Facility Procedures : CPT4 Code: 23899860 Description: 99214 - WOUND CARE VISIT-LEV 4 EST PT Modifier: Quantity: 1 Physician Procedures : CPT4 Code Description Modifier [id number] - WC PHYS LEVEL 4 - NEW PT ICD-10 Diagnosis Description 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

## 2023-09-15 ENCOUNTER — Ambulatory Visit
Admission: RE | Admit: 2023-09-15 | Discharge: 2023-09-15 | Disposition: A | Discharge status: Home / Self Care | Payer: HMO | Source: Ambulatory Visit | Attending: Family Medicine | Admitting: Family Medicine

## 2023-09-15 ENCOUNTER — Other Ambulatory Visit: Payer: Self-pay | Admitting: Family Medicine

## 2023-09-15 DIAGNOSIS — T148XXA Other injury of unspecified body region, initial encounter: Secondary | ICD-10-CM

## 2023-09-19 ENCOUNTER — Encounter (HOSPITAL_BASED_OUTPATIENT_CLINIC_OR_DEPARTMENT_OTHER): Payer: HMO | Attending: General Surgery | Admitting: General Surgery

## 2023-09-19 ENCOUNTER — Ambulatory Visit (HOSPITAL_COMMUNITY): Payer: HMO | Admitting: Speech Pathology

## 2023-09-19 ENCOUNTER — Telehealth: Payer: Self-pay | Admitting: Internal Medicine

## 2023-09-19 DIAGNOSIS — E11622 Type 2 diabetes mellitus with other skin ulcer: Secondary | ICD-10-CM | POA: Insufficient documentation

## 2023-09-19 DIAGNOSIS — L98499 Non-pressure chronic ulcer of skin of other sites with unspecified severity: Secondary | ICD-10-CM | POA: Diagnosis not present

## 2023-09-19 DIAGNOSIS — I5032 Chronic diastolic (congestive) heart failure: Secondary | ICD-10-CM | POA: Diagnosis not present

## 2023-09-19 DIAGNOSIS — Z4802 Encounter for removal of sutures: Secondary | ICD-10-CM | POA: Insufficient documentation

## 2023-09-19 NOTE — Telephone Encounter (Signed)
 PT needs letter/RX that it states that she does not need to wear a seat belt because she has cancer and has no lymph nodes so the belt can not go across her chest and neck. It was written on an RX pad years ago, she said. She has lost it and needs another.  [phone] is her #. Please call when she can come in and pick it up.

## 2023-09-20 NOTE — Progress Notes (Signed)
 [name], Leslie R ([id number]) [id number] Page 1 of 9 Visit Report for 09/19/2023 Arrival Information Details Patient Name: Date of Service: [name], [name] 09/19/2023 10:15 A M Medical Record Number: [id number] Patient Account Number: [id number] Date of Birth/Sex: Treating RN: 05/10/1941 (83 y.o. JEANELL Merleen Handing Primary Care Brayton Baumgartner: Leonel Cole Other Clinician: Referring Odella Appelhans: Treating Annalaura Sauseda/Extender: Marolyn Delon Leonel Cole Devra in Treatment: 1 Visit Information History Since Last Visit Added or deleted any medications: No Patient Arrived: Ambulatory Any new allergies or adverse reactions: No Arrival Time: 10:04 Had a fall or experienced change in No Accompanied By: spouse activities of daily living that may affect Transfer Assistance: None risk of falls: Patient Identification Verified: Yes Signs or symptoms of abuse/neglect since last visito No Secondary Verification Process Completed: Yes Hospitalized since last visit: No Patient Requires Transmission-Based Precautions: No Implantable device outside of the clinic excluding No Patient Has Alerts: Yes cellular tissue based products placed in the center Patient Alerts: Patient on Blood Thinner since last visit: Plavix  Has Dressing in Place as Prescribed: Yes Pain Present Now: Yes Electronic Signature(s) Signed: 09/19/2023 5:04:29 PM By: Merleen Handing RN, BSN Entered By: Boehlein, Linda on 09/19/2023 07:04:56 -------------------------------------------------------------------------------- Encounter Discharge Information Details Patient Name: Date of Service: [name] 09/19/2023 10:15 A M Medical Record Number: [id number] Patient Account Number: [id number] Date of Birth/Sex: Treating RN: November 11, 1940 (83 y.o. JEANELL Merleen Handing Primary Care Korrin Waterfield: Leonel Cole Other Clinician: Referring Adelaido Nicklaus: Treating Xylina Rhoads/Extender: Marolyn Delon Leonel Cole Devra in Treatment:  1 Encounter Discharge Information Items Post Procedure Vitals Discharge Condition: Stable Temperature (F): 97.6 Ambulatory Status: Ambulatory Pulse (bpm): 73 Discharge Destination: Home Respiratory Rate (breaths/min): 18 Transportation: Private Auto Blood Pressure (mmHg): 177/79 Accompanied By: spouse Schedule Follow-up Appointment: Yes Clinical Summary of Care: Patient Declined Electronic Signature(s) Signed: 09/19/2023 5:04:29 PM By: Merleen Handing RN, BSN Entered By: Merleen Handing on 09/19/2023 07:43:34 [place], [name] ([id number]) [id number] Page 2 of 9 -------------------------------------------------------------------------------- Lower Extremity Assessment Details Patient Name: Date of Service: Leslie, [name] 09/19/2023 10:15 A M Medical Record Number: [id number] Patient Account Number: [id number] Date of Birth/Sex: Treating RN: 08/13/1941 (83 y.o. JEANELL Merleen Handing Primary Care Tajah Schreiner: Leonel Cole Other Clinician: Referring Jonnelle Lawniczak: Treating Christopherjame Carnell/Extender: Marolyn Delon Leonel Cole Devra in Treatment: 1 Electronic Signature(s) Signed: 09/19/2023 5:04:29 PM By: Merleen Handing RN, BSN Entered By: Merleen Handing on 09/19/2023 07:06:14 -------------------------------------------------------------------------------- Multi Wound Chart Details Patient Name: Date of Service: [name] 09/19/2023 10:15 A M Medical Record Number: [id number] Patient Account Number: [id number] Date of Birth/Sex: Treating RN: 09/26/1940 (83 y.o. F) Primary Care Evangelina Delancey: Leonel Cole Other Clinician: Referring Loras Grieshop: Treating Anum Palecek/Extender: Marolyn Delon Leonel Cole Devra in Treatment: 1 Vital Signs Height(in): 65 Pulse(bpm): 73 Weight(lbs): 148 Blood Pressure(mmHg): 177/79 Body Mass Index(BMI): 24.6 Temperature(F): 97.6 Respiratory Rate(breaths/min): 18 [1:Photos:] [N/A:N/A] Left Hand - Dorsum Left Forearm N/A Wound Location: Trauma Trauma  N/A Wounding Event: Skin Tear Abrasion N/A Primary Etiology: Asthma, Congestive Heart Failure, Asthma, Congestive Heart Failure, N/A Comorbid History: Hypertension, Type II Diabetes Hypertension, Type II Diabetes 09/10/2023 09/10/2023 N/A Date Acquired: 1 1 N/A Weeks of Treatment: Open Open N/A Wound Status: No No N/A Wound Recurrence: 2.4x0.6x0.1 3.4x3.3x0.1 N/A Measurements L x W x D (cm) 1.131 8.812 N/A A (cm) : rea 0.113 0.881 N/A Volume (cm) : 80.80% 85.30% N/A % Reduction in Area: 80.80% 85.30% N/A % Reduction in Volume: Full Thickness Without Exposed Full Thickness Without Exposed N/A Classification: Support Structures Support Structures Medium Medium N/A Exudate Amount: Serosanguineous Serosanguineous N/A Exudate  Type: red, brown red, brown N/A Exudate Color: Distinct, outline attached Distinct, outline attached N/A Wound Margin: None Present (0%) Medium (34-66%) N/A Granulation Amount: N/A Red N/A Granulation QualityZONYA, Leslie R ([id number]) [phone].pdf Page 3 of 9 Large (67-100%) Medium (34-66%) N/A Necrotic Amount: Eschar, Adherent Slough Adherent Slough N/A Necrotic Tissue: Fat Layer (Subcutaneous Tissue): Yes Fat Layer (Subcutaneous Tissue): Yes N/A Exposed Structures: Fascia: No Fascia: No Tendon: No Tendon: No Muscle: No Muscle: No Joint: No Joint: No Bone: No Bone: No Medium (34-66%) Medium (34-66%) N/A Epithelialization: Debridement - Excisional Debridement - Excisional N/A Debridement: Pre-procedure Verification/Time Out 10:25 10:25 N/A Taken: Lidocaine  4% Topical Solution Lidocaine  4% Topical Solution N/A Pain Control: Necrotic/Eschar, Subcutaneous, Necrotic/Eschar, Subcutaneous, N/A Tissue Debrided: [employer] Skin/Subcutaneous Tissue Skin/Subcutaneous Tissue N/A Level: 1.13 5.28 N/A Debridement A (sq cm): rea Curette Curette N/A Instrument: Minimum Minimum N/A Bleeding: Pressure Pressure  N/A Hemostasis Achieved: 1 1 N/A Procedural Pain: 0 0 N/A Post Procedural Pain: Debridement Treatment Response: Procedure was tolerated well Procedure was tolerated well N/A Post Debridement Measurements L x 2.4x0.6x0.1 3.4x3.3x0.1 N/A W x D (cm) 0.113 0.881 N/A Post Debridement Volume: (cm) Excoriation: No Excoriation: No N/A Periwound Skin Texture: Induration: No Induration: No Callus: No Callus: No Crepitus: No Crepitus: No Rash: No Rash: No Scarring: No Scarring: No Maceration: No Maceration: No N/A Periwound Skin Moisture: Dry/Scaly: No Dry/Scaly: No Atrophie Blanche: No Ecchymosis: Yes N/A Periwound Skin Color: Cyanosis: No Atrophie Blanche: No Ecchymosis: No Cyanosis: No Erythema: No Erythema: No Hemosiderin Staining: No Hemosiderin Staining: No Mottled: No Mottled: No Pallor: No Pallor: No Rubor: No Rubor: No No Abnormality No Abnormality N/A Temperature: Yes Yes N/A Tenderness on Palpation: Debridement Debridement N/A Procedures Performed: Treatment Notes Electronic Signature(s) Signed: 09/19/2023 10:32:51 AM By: Marolyn Nest MD FACS Entered By: Marolyn Nest on 09/19/2023 07:32:51 -------------------------------------------------------------------------------- Multi-Disciplinary Care Plan Details Patient Name: Date of Service: Leslie MOTT [name] 09/19/2023 10:15 A M Medical Record Number: [id number] Patient Account Number: [id number] Date of Birth/Sex: Treating RN: [birth year] (83 y.o. JEANELL Merleen Handing Primary Care Axxel Gude: Leonel Cole Other Clinician: Referring Kimari Lienhard: Treating Shad Ledvina/Extender: Marolyn Nest Leonel Cole Devra in Treatment: 1 Multidisciplinary Care Plan reviewed with physician Active Inactive Pain, Acute or Chronic Nursing Diagnoses: Pain, acute or chronic: actual or potential GoalsSAVANAH, BAYLES R ([id number]) [id number].pdf Page 4 of 9 Patient will verbalize adequate pain control and  receive pain control interventions during procedures as needed Date Initiated: 09/12/2023 Target Resolution Date: 10/20/2023 Goal Status: Active Patient/caregiver will verbalize comfort level met Date Initiated: 09/12/2023 Target Resolution Date: 10/20/2023 Goal Status: Active Interventions: Encourage patient to take pain medications as prescribed Provide education on pain management Reposition patient for comfort Treatment Activities: Administer pain control measures as ordered : 09/12/2023 Notes: Wound/Skin Impairment Nursing Diagnoses: Knowledge deficit related to ulceration/compromised skin integrity Goals: Patient/caregiver will verbalize understanding of skin care regimen Date Initiated: 09/12/2023 Target Resolution Date: 10/10/2023 Goal Status: Active Interventions: Assess patient/caregiver ability to perform ulcer/skin care regimen upon admission and as needed Assess ulceration(s) every visit Provide education on ulcer and skin care Treatment Activities: Skin care regimen initiated : 09/12/2023 Topical wound management initiated : 09/12/2023 Notes: Electronic Signature(s) Signed: 09/19/2023 5:04:29 PM By: Merleen Handing RN, BSN Entered By: Merleen Handing on 09/19/2023 07:22:09 -------------------------------------------------------------------------------- Pain Assessment Details Patient Name: Date of Service: Leslie MOTT [name] 09/19/2023 10:15 A M Medical Record Number: [id number] Patient Account Number: [id number] Date of Birth/Sex: Treating RN: Nov 30, 1940 (83 y.o. JEANELL Merleen Handing Primary Care Magaby Rumberger: Leonel Cole  Other Clinician: Referring Truxton Stupka: Treating Ethleen Lormand/Extender: Marolyn Delon Leonel Cheryle Devra in Treatment: 1 Active Problems Location of Pain Severity and Description of Pain Patient Has Paino Yes Site Locations Pain LocationGABBRIELLE, MCNICHOLAS ([id number]) [phone].pdf Page 5 of 9 Pain Location: Pain in Ulcers With  Dressing Change: Yes Duration of the Pain. Constant / Intermittento Constant Rate the pain. Current Pain Level: 8 Worst Pain Level: 8 Least Pain Level: 4 Character of Pain Describe the Pain: Aching, Burning Pain Management and Medication Current Pain Management: Medication: Yes Is the Current Pain Management Adequate: Adequate How does your wound impact your activities of daily livingo Sleep: No Bathing: No Appetite: No Relationship With Others: No Bladder Continence: No Emotions: Yes Bowel Continence: No Hobbies: No Toileting: No Dressing: No Electronic Signature(s) Signed: 09/19/2023 5:04:29 PM By: Merleen Handing RN, BSN Entered By: Boehlein, Linda on 09/19/2023 07:05:58 -------------------------------------------------------------------------------- Patient/Caregiver Education Details Patient Name: Date of Service: [name] 1/7/2025andnbsp10:15 A M Medical Record Number: [id number] Patient Account Number: [id number] Date of Birth/Gender: Treating RN: 11-14-1940 (83 y.o. JEANELL Merleen Handing Primary Care Physician: Leonel Cheryle Other Clinician: Referring Physician: Treating Physician/Extender: Marolyn Delon Leonel Cheryle Devra in Treatment: 1 Education Assessment Education Provided To: Patient Education Topics Provided Pain: Methods: Explain/Verbal Responses: Reinforcements needed, State content correctly Safety: Methods: Explain/Verbal Responses: Reinforcements needed, State content correctly Wound/Skin Impairment: Methods: Explain/Verbal Responses: Reinforcements needed, State content correctly YACHET, MATTSON R ([id number]) [phone].pdf Page 6 of 9 Electronic Signature(s) Signed: 09/19/2023 5:04:29 PM By: Merleen Handing RN, BSN Entered By: Boehlein, Linda on 09/19/2023 07:22:47 -------------------------------------------------------------------------------- Wound Assessment Details Patient Name: Date of Service: [name]  09/19/2023 10:15 A M Medical Record Number: [id number] Patient Account Number: [id number] Date of Birth/Sex: Treating RN: 05/23/1941 (83 y.o. JEANELL Merleen Handing Primary Care Sheanna Dail: Leonel Cheryle Other Clinician: Referring Lashika Erker: Treating Olesya Wike/Extender: Marolyn Delon Leonel Cheryle Devra in Treatment: 1 Wound Status Wound Number: 1 Primary Skin Tear Etiology: Wound Location: Left Hand - Dorsum Wound Status: Open Wounding Event: Trauma Comorbid Asthma, Congestive Heart Failure, Hypertension, Type II Date Acquired: 09/10/2023 History: Diabetes Weeks Of Treatment: 1 Clustered Wound: No Photos Wound Measurements Length: (cm) 2.4 Width: (cm) 0.6 Depth: (cm) 0.1 Area: (cm) 1.131 Volume: (cm) 0.113 % Reduction in Area: 80.8% % Reduction in Volume: 80.8% Epithelialization: Medium (34-66%) Tunneling: No Undermining: No Wound Description Classification: Full Thickness Without Exposed Suppor Wound Margin: Distinct, outline attached Exudate Amount: Medium Exudate Type: Serosanguineous Exudate Color: red, brown t Structures Foul Odor After Cleansing: No Slough/Fibrino No Wound Bed Granulation Amount: None Present (0%) Exposed Structure Necrotic Amount: Large (67-100%) Fascia Exposed: No Necrotic Quality: Eschar, Adherent Slough Fat Layer (Subcutaneous Tissue) Exposed: Yes Tendon Exposed: No Muscle Exposed: No Joint Exposed: No Bone Exposed: No Periwound Skin Texture Texture Color No Abnormalities Noted: No No Abnormalities Noted: Yes Callus: No Temperature / Pain Crepitus: No Temperature: No Abnormality Excoriation: No Tenderness on Palpation: Yes Induration: No Cuadra, Jenee R ([id number]) [id number] Page 7 of 9 Rash: No Scarring: No Moisture No Abnormalities Noted: No Dry / Scaly: No Maceration: No Treatment Notes Wound #1 (Hand - Dorsum) Wound Laterality: Left Cleanser Soap and Water Discharge Instruction: May shower and wash  wound with dial antibacterial soap and water prior to dressing change. Peri-Wound Care Topical Primary Dressing Xeroform Occlusive Gauze Dressing, 4x4 in Discharge Instruction: Apply to wound bed as instructed Secondary Dressing Woven Gauze Sponge, Non-Sterile 4x4 in Discharge Instruction: Apply over primary dressing as directed. Secured With Kerlix Roll Sterile, 4.5x3.1 (in/yd) Discharge Instruction:  Secure with Kerlix as directed. 112M Medipore H Soft Cloth Surgical T ape, 4 x 10 (in/yd) Discharge Instruction: Secure with tape as directed. Elastic Net Retention Dressing, Size 5, 12x25 (in/yd) Discharge Instruction: Apply over the kerlix to secure in place. Compression Wrap Compression Stockings Add-Ons Electronic Signature(s) Signed: 09/19/2023 5:04:29 PM By: Boehlein, Linda RN, BSN Entered By: Boehlein, Linda on 09/19/2023 07:17:35 -------------------------------------------------------------------------------- Wound Assessment Details Patient Name: Date of Service: [name] 09/19/2023 10:15 A M Medical Record Number: [id number] Patient Account Number: [id number] Date of Birth/Sex: Treating RN: 03/21/1941 (83 y.o. JEANELL Merleen Handing Primary Care Kesia Dalto: Leonel Cole Other Clinician: Referring Mikyle Sox: Treating Willmar Stockinger/Extender: Marolyn Delon Leonel Cole Devra in Treatment: 1 Wound Status Wound Number: 2 Primary Abrasion Etiology: Wound Location: Left Forearm Wound Status: Open Wounding Event: Trauma Comorbid Asthma, Congestive Heart Failure, Hypertension, Type II Date Acquired: 09/10/2023 History: Diabetes Weeks Of Treatment: 1 Clustered Wound: No Photos Heffern, Khamiyah R ([id number]) [phone].pdf Page 8 of 9 Wound Measurements Length: (cm) 3.4 Width: (cm) 3.3 Depth: (cm) 0.1 Area: (cm) 8.812 Volume: (cm) 0.881 % Reduction in Area: 85.3% % Reduction in Volume: 85.3% Epithelialization: Medium (34-66%) Tunneling: No Undermining:  No Wound Description Classification: Full Thickness Without Exposed Support Structures Wound Margin: Distinct, outline attached Exudate Amount: Medium Exudate Type: Serosanguineous Exudate Color: red, brown Foul Odor After Cleansing: No Slough/Fibrino No Wound Bed Granulation Amount: Medium (34-66%) Exposed Structure Granulation Quality: Red Fascia Exposed: No Necrotic Amount: Medium (34-66%) Fat Layer (Subcutaneous Tissue) Exposed: Yes Necrotic Quality: Adherent Slough Tendon Exposed: No Muscle Exposed: No Joint Exposed: No Bone Exposed: No Periwound Skin Texture Texture Color No Abnormalities Noted: No No Abnormalities Noted: No Callus: No Atrophie Blanche: No Crepitus: No Cyanosis: No Excoriation: No Ecchymosis: Yes Induration: No Erythema: No Rash: No Hemosiderin Staining: No Scarring: No Mottled: No Pallor: No Moisture Rubor: No No Abnormalities Noted: No Dry / Scaly: No Temperature / Pain Maceration: No Temperature: No Abnormality Tenderness on Palpation: Yes Treatment Notes Wound #2 (Forearm) Wound Laterality: Left Cleanser Soap and Water Discharge Instruction: May shower and wash wound with dial antibacterial soap and water prior to dressing change. Peri-Wound Care Topical Primary Dressing Xeroform Occlusive Gauze Dressing, 4x4 in Discharge Instruction: Apply to wound bed as instructed Secondary Dressing Woven Gauze Sponge, Non-Sterile 4x4 in Discharge Instruction: Apply over primary dressing as directed. Secured With [employer], 4.5x3.1 (in/yd) Discharge Instruction: Secure with Kerlix as directed. Ambler, Reisha R ([id number]) [id number] Page 9 of 9 112M Medipore H Soft Cloth Surgical T ape, 4 x 10 (in/yd) Discharge Instruction: Secure with tape as directed. Elastic Net Retention Dressing, Size 5, 12x25 (in/yd) Discharge Instruction: Apply over the kerlix to secure in place. Compression Wrap Compression  Stockings Add-Ons Electronic Signature(s) Signed: 09/19/2023 5:04:29 PM By: Boehlein, Linda RN, BSN Entered By: Boehlein, Linda on 09/19/2023 07:18:12 -------------------------------------------------------------------------------- Vitals Details Patient Name: Date of Service: [name] 09/19/2023 10:15 A M Medical Record Number: [id number] Patient Account Number: [id number] Date of Birth/Sex: Treating RN: 10/11/1940 (83 y.o. JEANELL Merleen Handing Primary Care Nataki Mccrumb: Leonel Cole Other Clinician: Referring Amorah Sebring: Treating Adamariz Gillott/Extender: Marolyn Delon Leonel Cole Devra in Treatment: 1 Vital Signs Time Taken: 10:06 Temperature (F): 97.6 Height (in): 65 Pulse (bpm): 73 Weight (lbs): 148 Respiratory Rate (breaths/min): 18 Body Mass Index (BMI): 24.6 Blood Pressure (mmHg): 177/79 Reference Range: 80 - 120 mg / dl Electronic Signature(s) Signed: 09/19/2023 5:04:29 PM By: Merleen Handing RN, BSN Entered By: Boehlein, Linda on 09/19/2023 07:06:51

## 2023-09-20 NOTE — Progress Notes (Signed)
 Delpozo, Quynn [name] ([id number]) [id number] Page 1 of 8 Visit Report for 09/19/2023 Chief Complaint Document Details Patient Name: Date of Service: [name], [name] 09/19/2023 10:15 A M Medical Record Number: [id number] Patient Account Number: [id number] Date of Birth/Sex: Treating RN: 10/06/1940 (83 y.o. F) Primary Care Provider: Leonel Cole Other Clinician: Referring Provider: Treating Provider/Extender: Marolyn Delon Leonel Cole Devra in Treatment: 1 Information Obtained from: Patient Chief Complaint Patient seen for complaints of Non-Healing Wound. Electronic Signature(s) Signed: 09/19/2023 10:32:58 AM By: Marolyn Delon MD FACS Entered By: Marolyn Delon on 09/19/2023 10:32:58 -------------------------------------------------------------------------------- Debridement Details Patient Name: Date of Service: [name] 09/19/2023 10:15 A M Medical Record Number: [id number] Patient Account Number: [id number] Date of Birth/Sex: Treating RN: 11/08/1940 (83 y.o. JEANELL Merleen Handing Primary Care Provider: Leonel Cole Other Clinician: Referring Provider: Treating Provider/Extender: Marolyn Delon Leonel Cole Devra in Treatment: 1 Debridement Performed for Assessment: Wound #1 Left Hand - Dorsum Performed By: Physician Marolyn Delon, MD The following information was scribed by: Merleen Handing The information was scribed for: Marolyn Delon Debridement Type: Debridement Level of Consciousness (Pre-procedure): Awake and Alert Pre-procedure Verification/Time Out Yes - 10:25 Taken: Start Time: 10:26 Pain Control: Lidocaine  4% T opical Solution Percent of Wound Bed Debrided: 100% T Area Debrided (cm): otal 1.13 Tissue and other material debrided: Viable, Non-Viable, Eschar, Slough, Subcutaneous, Slough Level: Skin/Subcutaneous Tissue Debridement Description: Excisional Instrument: Curette Bleeding: Minimum Hemostasis Achieved: Pressure Procedural  Pain: 1 Post Procedural Pain: 0 Response to Treatment: Procedure was tolerated well Level of Consciousness (Post- Awake and Alert procedure): Post Debridement Measurements of Total Wound Length: (cm) 2.4 Width: (cm) 0.6 Depth: (cm) 0.1 Volume: (cm) 0.113 Character of Wound/Ulcer Post Debridement: Improved Sloniker, Renette [name] ([id number]) [phone].pdf Page 2 of 8 Post Procedure Diagnosis Same as Pre-procedure Electronic Signature(s) Signed: 09/19/2023 12:28:17 PM By: Marolyn Delon MD FACS Signed: 09/19/2023 5:04:29 PM By: Merleen Handing RN, BSN Entered By: Merleen Handing on 09/19/2023 10:28:12 -------------------------------------------------------------------------------- Debridement Details Patient Name: Date of Service: [name] 09/19/2023 10:15 A M Medical Record Number: [id number] Patient Account Number: [id number] Date of Birth/Sex: Treating RN: 05/12/1941 (83 y.o. JEANELL Merleen Handing Primary Care Provider: Leonel Cole Other Clinician: Referring Provider: Treating Provider/Extender: Marolyn Delon Leonel Cole Devra in Treatment: 1 Debridement Performed for Assessment: Wound #2 Left Forearm Performed By: Physician Marolyn Delon, MD The following information was scribed by: Merleen Handing The information was scribed for: Marolyn Delon Debridement Type: Debridement Level of Consciousness (Pre-procedure): Awake and Alert Pre-procedure Verification/Time Out Yes - 10:25 Taken: Start Time: 10:26 Pain Control: Lidocaine  4% T opical Solution Percent of Wound Bed Debrided: 60% T Area Debrided (cm): otal 5.28 Tissue and other material debrided: Viable, Non-Viable, Eschar, Slough, Subcutaneous, Slough Level: Skin/Subcutaneous Tissue Debridement Description: Excisional Instrument: Curette Bleeding: Minimum Hemostasis Achieved: Pressure Procedural Pain: 1 Post Procedural Pain: 0 Response to Treatment: Procedure was tolerated well Level of  Consciousness (Post- Awake and Alert procedure): Post Debridement Measurements of Total Wound Length: (cm) 3.4 Width: (cm) 3.3 Depth: (cm) 0.1 Volume: (cm) 0.881 Character of Wound/Ulcer Post Debridement: Improved Post Procedure Diagnosis Same as Pre-procedure Electronic Signature(s) Signed: 09/19/2023 12:28:17 PM By: Marolyn Delon MD FACS Signed: 09/19/2023 5:04:29 PM By: Merleen Handing RN, BSN Entered By: Merleen Handing on 09/19/2023 10:29:03 HPI Details -------------------------------------------------------------------------------- Leslie JOAN SAUNDERS ([id number]) [id number] Page 3 of 8 Patient Name: Date of Service: [name]. 09/19/2023 10:15 A M Medical Record Number: [id number] Patient Account Number: [id number] Date of Birth/Sex: Treating RN: 05/26/1941 (83 y.o. F) Primary Care  Provider: Leonel Cole Other Clinician: Referring Provider: Treating Provider/Extender: Marolyn Delon Leonel Cole Devra in Treatment: 1 History of Present Illness HPI Description: ADMISSION 09/12/2023 This is an 83 year old woman with a history of poorly controlled type 2 diabetes (last available hemoglobin A1c 10.4%, about 1 year ago). She suffered a fall that resulted in a large skin tear on her arm and dorsal hand. She was referred to the wound care center for further evaluation and management of the wound. 09/19/2023: The majority of the arm has healed with just 2 open areas near the elbow. The sutures in the elbow are intact. The dorsum of her hand has eschar over it, the wound is quite a bit smaller with just some slough on the surface. Electronic Signature(s) Signed: 09/19/2023 10:33:39 AM By: Marolyn Delon MD FACS Entered By: Marolyn Delon on 09/19/2023 10:33:38 -------------------------------------------------------------------------------- Physical Exam Details Patient Name: Date of Service: [name] 09/19/2023 10:15 A M Medical Record Number:  [id number] Patient Account Number: [id number] Date of Birth/Sex: Treating RN: 02/29/1941 (83 y.o. F) Primary Care Provider: Leonel Cole Other Clinician: Referring Provider: Treating Provider/Extender: Marolyn Delon Leonel Cole Devra in Treatment: 1 Constitutional Hypertensive, asymptomatic. . . . no acute distress. Respiratory Normal work of breathing on room air.. Notes 09/19/2023: The majority of the arm has healed with just 2 open areas near the elbow. The sutures in the elbow are intact. The dorsum of her hand has eschar over it, the wound is quite a bit smaller with just some slough on the surface. Electronic Signature(s) Signed: 09/19/2023 10:34:11 AM By: Marolyn Delon MD FACS Entered By: Marolyn Delon on 09/19/2023 10:34:10 -------------------------------------------------------------------------------- Physician Orders Details Patient Name: Date of Service: [name] 09/19/2023 10:15 A M Medical Record Number: [id number] Patient Account Number: [id number] Date of Birth/Sex: Treating RN: 05/12/1941 (83 y.o. JEANELL Merleen Handing Primary Care Provider: Leonel Cole Other Clinician: Referring Provider: Treating Provider/Extender: Marolyn Delon Leonel Cole Devra in Treatment: 1 The following information was scribed by: Merleen Handing The information was scribed for: Marolyn Delon Verbal / Phone Orders: No Diagnosis Coding JULYA, ALIOTO [name] ([id number]) [phone].pdf Page 4 of 8 Follow-up Appointments ppointment in 1 week. - Dr. Marolyn Return A Anesthetic Wound #1 Left Hand - Dorsum (In clinic) Topical Lidocaine  5% applied to wound bed (In clinic) Topical Lidocaine  4% applied to wound bed Wound #2 Left Forearm (In clinic) Topical Lidocaine  5% applied to wound bed (In clinic) Topical Lidocaine  4% applied to wound bed Bathing/ Shower/ Hygiene May shower and wash wound with soap and water. - gently wash with soap and water when changing the  dressing. Edema Control - Orders / Instructions Other Edema Control Orders/Instructions: - elevate the left hand and arm when sitting in chair to aid in swelling. Wound Treatment Wound #1 - Hand - Dorsum Wound Laterality: Left Cleanser: Soap and Water Every Other Day/30 Days Discharge Instructions: May shower and wash wound with dial antibacterial soap and water prior to dressing change. Prim Dressing: Xeroform Occlusive Gauze Dressing, 4x4 in (Generic) Every Other Day/30 Days ary Discharge Instructions: Apply to wound bed as instructed Secondary Dressing: Woven Gauze Sponge, Non-Sterile 4x4 in (Generic) Every Other Day/30 Days Discharge Instructions: Apply over primary dressing as directed. Secured With: [employer], 4.5x3.1 (in/yd) (Generic) Every Other Day/30 Days Discharge Instructions: Secure with Kerlix as directed. Secured With: 63M Medipore H Soft Cloth Surgical T ape, 4 x 10 (in/yd) (Generic) Every Other Day/30 Days Discharge Instructions: Secure with tape as directed. Secured With: Counselling Psychologist  Retention Dressing, Size 5, 12x25 (in/yd) (Generic) Every Other Day/30 Days Discharge Instructions: Apply over the kerlix to secure in place. Wound #2 - Forearm Wound Laterality: Left Cleanser: Soap and Water Every Other Day/30 Days Discharge Instructions: May shower and wash wound with dial antibacterial soap and water prior to dressing change. Prim Dressing: Xeroform Occlusive Gauze Dressing, 4x4 in (Generic) Every Other Day/30 Days ary Discharge Instructions: Apply to wound bed as instructed Secondary Dressing: Woven Gauze Sponge, Non-Sterile 4x4 in (Generic) Every Other Day/30 Days Discharge Instructions: Apply over primary dressing as directed. Secured With: [employer], 4.5x3.1 (in/yd) (Generic) Every Other Day/30 Days Discharge Instructions: Secure with Kerlix as directed. Secured With: 43M Medipore H Soft Cloth Surgical T ape, 4 x 10 (in/yd) (Generic) Every Other  Day/30 Days Discharge Instructions: Secure with tape as directed. Secured With: Physiological Scientist Dressing, Size 5, 12x25 (in/yd) (Generic) Every Other Day/30 Days Discharge Instructions: Apply over the kerlix to secure in place. Patient Medications llergies: oxycodone  HCl, Percocet, tramadol , Cipro , doxycycline  A Notifications Medication Indication Start End prior to debridement 09/19/2023 lidocaine  DOSE topical 4 % cream - cream topical Electronic Signature(s) Signed: 09/19/2023 12:28:17 PM By: Marolyn Nest MD FACS Entered By: Marolyn Nest on 09/19/2023 10:35:17 Vanhoose, JOAN SAUNDERS ([id number]) [id number] Page 5 of 8 -------------------------------------------------------------------------------- Problem List Details Patient Name: Date of Service: LORIBETH, KATICH 09/19/2023 10:15 A M Medical Record Number: [id number] Patient Account Number: [id number] Date of Birth/Sex: Treating RN: 04/26/1941 (83 y.o. F) Primary Care Provider: Leonel Cole Other Clinician: Referring Provider: Treating Provider/Extender: Marolyn Nest Leonel Cole Devra in Treatment: 1 Active Problems ICD-10 Encounter Code Description Active Date MDM Diagnosis L98.499 Non-pressure chronic ulcer of skin of other sites with unspecified severity 09/12/2023 No Yes E11.622 Type 2 diabetes mellitus with other skin ulcer 09/12/2023 No Yes I50.32 Chronic diastolic (congestive) heart failure 09/12/2023 No Yes Inactive Problems Resolved Problems Electronic Signature(s) Signed: 09/19/2023 10:31:42 AM By: Marolyn Nest MD FACS Entered By: Marolyn Nest on 09/19/2023 10:31:42 -------------------------------------------------------------------------------- Progress Note Details Patient Name: Date of Service: [name] 09/19/2023 10:15 A M Medical Record Number: [id number] Patient Account Number: [id number] Date of Birth/Sex: Treating RN: 10/22/1940 (83 y.o. F) Primary Care Provider:  Leonel Cole Other Clinician: Referring Provider: Treating Provider/Extender: Marolyn Nest Leonel Cole Devra in Treatment: 1 Subjective Chief Complaint Information obtained from Patient Patient seen for complaints of Non-Healing Wound. History of Present Illness (HPI) ADMISSION 09/12/2023 This is an 83 year old woman with a history of poorly controlled type 2 diabetes (last available hemoglobin A1c 10.4%, about 1 year ago). She suffered a fall that resulted in a large skin tear on her arm and dorsal hand. She was referred to the wound care center for further evaluation and management of the wound. 09/19/2023: The majority of the arm has healed with just 2 open areas near the elbow. The sutures in the elbow are intact. The dorsum of her hand has eschar over it, the wound is quite a bit smaller with just some slough on the surface. Luzader, Sady [name] ([id number]) [id number] Page 6 of 8 Objective Constitutional Hypertensive, asymptomatic. no acute distress. Vitals Time Taken: 10:06 AM, Height: 65 in, Weight: 148 lbs, BMI: 24.6, Temperature: 97.6 F, Pulse: 73 bpm, Respiratory Rate: 18 breaths/min, Blood Pressure: 177/79 mmHg. Respiratory Normal work of breathing on room air.. General Notes: 09/19/2023: The majority of the arm has healed with just 2 open areas near the elbow. The sutures in the elbow are intact. The dorsum of her hand has  eschar over it, the wound is quite a bit smaller with just some slough on the surface. Integumentary (Hair, Skin) Wound #1 status is Open. Original cause of wound was Trauma. The date acquired was: 09/10/2023. The wound has been in treatment 1 weeks. The wound is located on the Left Hand - Dorsum. The wound measures 2.4cm length x 0.6cm width x 0.1cm depth; 1.131cm^2 area and 0.113cm^3 volume. There is Fat Layer (Subcutaneous Tissue) exposed. There is no tunneling or undermining noted. There is a medium amount of serosanguineous  drainage noted. The wound margin is distinct with the outline attached to the wound base. There is no granulation within the wound bed. There is a large (67-100%) amount of necrotic tissue within the wound bed including Eschar and Adherent Slough. The periwound skin appearance had no abnormalities noted for color. The periwound skin appearance did not exhibit: Callus, Crepitus, Excoriation, Induration, Rash, Scarring, Dry/Scaly, Maceration. Periwound temperature was noted as No Abnormality. The periwound has tenderness on palpation. Wound #2 status is Open. Original cause of wound was Trauma. The date acquired was: 09/10/2023. The wound has been in treatment 1 weeks. The wound is located on the Left Forearm. The wound measures 3.4cm length x 3.3cm width x 0.1cm depth; 8.812cm^2 area and 0.881cm^3 volume. There is Fat Layer (Subcutaneous Tissue) exposed. There is no tunneling or undermining noted. There is a medium amount of serosanguineous drainage noted. The wound margin is distinct with the outline attached to the wound base. There is medium (34-66%) red granulation within the wound bed. There is a medium (34-66%) amount of necrotic tissue within the wound bed including Adherent Slough. The periwound skin appearance exhibited: Ecchymosis. The periwound skin appearance did not exhibit: Callus, Crepitus, Excoriation, Induration, Rash, Scarring, Dry/Scaly, Maceration, Atrophie Blanche, Cyanosis, Hemosiderin Staining, Mottled, Pallor, Rubor, Erythema. Periwound temperature was noted as No Abnormality. The periwound has tenderness on palpation. Assessment Active Problems ICD-10 Non-pressure chronic ulcer of skin of other sites with unspecified severity Type 2 diabetes mellitus with other skin ulcer Chronic diastolic (congestive) heart failure Procedures Wound #1 Pre-procedure diagnosis of Wound #1 is a Skin T located on the Left Hand - Dorsum . There was a Excisional Skin/Subcutaneous Tissue  Debridement with a ear total area of 1.13 sq cm performed by Marolyn Nest, MD. With the following instrument(s): Curette to remove Viable and Non-Viable tissue/material. Material removed includes Eschar, Subcutaneous Tissue, and Slough after achieving pain control using Lidocaine  4% T opical Solution. No specimens were taken. A time out was conducted at 10:25, prior to the start of the procedure. A Minimum amount of bleeding was controlled with Pressure. The procedure was tolerated well with a pain level of 1 throughout and a pain level of 0 following the procedure. Post Debridement Measurements: 2.4cm length x 0.6cm width x 0.1cm depth; 0.113cm^3 volume. Character of Wound/Ulcer Post Debridement is improved. Post procedure Diagnosis Wound #1: Same as Pre-Procedure Wound #2 Pre-procedure diagnosis of Wound #2 is an Abrasion located on the Left Forearm . There was a Excisional Skin/Subcutaneous Tissue Debridement with a total area of 5.28 sq cm performed by Marolyn Nest, MD. With the following instrument(s): Curette to remove Viable and Non-Viable tissue/material. Material removed includes Eschar, Subcutaneous Tissue, and Slough after achieving pain control using Lidocaine  4% T opical Solution. No specimens were taken. A time out was conducted at 10:25, prior to the start of the procedure. A Minimum amount of bleeding was controlled with Pressure. The procedure was tolerated well with a pain level  of 1 throughout and a pain level of 0 following the procedure. Post Debridement Measurements: 3.4cm length x 3.3cm width x 0.1cm depth; 0.881cm^3 volume. Character of Wound/Ulcer Post Debridement is improved. Post procedure Diagnosis Wound #2: Same as Pre-Procedure Plan Follow-up Appointments: ELLAMAY, FORS ([id number]) [phone].pdf Page 7 of 8 Return Appointment in 1 week. - Dr. Marolyn Anesthetic: Wound #1 Left Hand - Dorsum: (In clinic) Topical Lidocaine  5%  applied to wound bed (In clinic) Topical Lidocaine  4% applied to wound bed Wound #2 Left Forearm: (In clinic) Topical Lidocaine  5% applied to wound bed (In clinic) Topical Lidocaine  4% applied to wound bed Bathing/ Shower/ Hygiene: May shower and wash wound with soap and water. - gently wash with soap and water when changing the dressing. Edema Control - Orders / Instructions: Other Edema Control Orders/Instructions: - elevate the left hand and arm when sitting in chair to aid in swelling. The following medication(s) was prescribed: lidocaine  topical 4 % cream cream topical for prior to debridement was prescribed at facility WOUND #1: - Hand - Dorsum Wound Laterality: Left Cleanser: Soap and Water Every Other Day/30 Days Discharge Instructions: May shower and wash wound with dial antibacterial soap and water prior to dressing change. Prim Dressing: Xeroform Occlusive Gauze Dressing, 4x4 in (Generic) Every Other Day/30 Days ary Discharge Instructions: Apply to wound bed as instructed Secondary Dressing: Woven Gauze Sponge, Non-Sterile 4x4 in (Generic) Every Other Day/30 Days Discharge Instructions: Apply over primary dressing as directed. Secured With: [employer], 4.5x3.1 (in/yd) (Generic) Every Other Day/30 Days Discharge Instructions: Secure with Kerlix as directed. Secured With: 18M Medipore H Soft Cloth Surgical T ape, 4 x 10 (in/yd) (Generic) Every Other Day/30 Days Discharge Instructions: Secure with tape as directed. Secured With: Physiological Scientist Dressing, Size 5, 12x25 (in/yd) (Generic) Every Other Day/30 Days Discharge Instructions: Apply over the kerlix to secure in place. WOUND #2: - Forearm Wound Laterality: Left Cleanser: Soap and Water Every Other Day/30 Days Discharge Instructions: May shower and wash wound with dial antibacterial soap and water prior to dressing change. Prim Dressing: Xeroform Occlusive Gauze Dressing, 4x4 in (Generic) Every Other Day/30  Days ary Discharge Instructions: Apply to wound bed as instructed Secondary Dressing: Woven Gauze Sponge, Non-Sterile 4x4 in (Generic) Every Other Day/30 Days Discharge Instructions: Apply over primary dressing as directed. Secured With: [employer], 4.5x3.1 (in/yd) (Generic) Every Other Day/30 Days Discharge Instructions: Secure with Kerlix as directed. Secured With: 18M Medipore H Soft Cloth Surgical T ape, 4 x 10 (in/yd) (Generic) Every Other Day/30 Days Discharge Instructions: Secure with tape as directed. Secured With: Physiological Scientist Dressing, Size 5, 12x25 (in/yd) (Generic) Every Other Day/30 Days Discharge Instructions: Apply over the kerlix to secure in place. 09/19/2023: The majority of the arm has healed with just 2 open areas near the elbow. The sutures in the elbow are intact. The dorsum of her hand has eschar over it, the wound is quite a bit smaller with just some slough on the surface. I used a curette to debride eschar, slough, and subcutaneous tissue from the wound on the back of her hand. I debrided slough and subcutaneous tissue from the open areas by her elbow. We will continue Xeroform and Kerlix. She will follow-up in 1 week, at which time I expect much of this to be healed and I can likely remove her sutures. Electronic Signature(s) Signed: 09/19/2023 10:35:56 AM By: Marolyn Nest MD FACS Entered By: Marolyn Nest on 09/19/2023 10:35:56 -------------------------------------------------------------------------------- SuperBill Details Patient  Name: Date of Service: Zepeda, Zeenat [name]. 09/19/2023 Medical Record Number: [id number] Patient Account Number: [id number] Date of Birth/Sex: Treating RN: 06/05/1941 (83 y.o. F) Primary Care Provider: Leonel Cole Other Clinician: Referring Provider: Treating Provider/Extender: Marolyn Delon Leonel Cole Devra in Treatment: 1 Diagnosis Coding ICD-10 Codes Code Description [phone] Non-pressure chronic ulcer of skin  of other sites with unspecified severity E11.622 Type 2 diabetes mellitus with other skin ulcer I50.32 Chronic diastolic (congestive) heart failure [employer], Tieasha [name] ([id number]) [phone].pdf Page 8 of 8 Facility Procedures : CPT4 Code: [id number] Description: 11042 - DEB SUBQ TISSUE 20 SQ CM/< ICD-10 Diagnosis Description L98.499 Non-pressure chronic ulcer of skin of other sites with unspecified severity E11.622 Type 2 diabetes mellitus with other skin ulcer I50.32 Chronic diastolic (congestive)  heart failure Modifier: Quantity: 1 Physician Procedures : CPT4 Code Description Modifier [id number] 11042 - WC PHYS SUBQ TISS 20 SQ CM ICD-10 Diagnosis Description L98.499 Non-pressure chronic ulcer of skin of other sites with unspecified severity E11.622 Type 2 diabetes mellitus with other skin ulcer I50.32  Chronic diastolic (congestive) heart failure Quantity: 1 Electronic Signature(s) Signed: 09/19/2023 10:36:06 AM By: Marolyn Delon MD FACS Entered By: Marolyn Delon on 09/19/2023 10:36:05

## 2023-09-21 ENCOUNTER — Other Ambulatory Visit (HOSPITAL_COMMUNITY): Payer: Self-pay | Admitting: Family Medicine

## 2023-09-21 DIAGNOSIS — T148XXA Other injury of unspecified body region, initial encounter: Secondary | ICD-10-CM

## 2023-09-22 NOTE — Progress Notes (Signed)
 Johann Sieving, MD  Vaudine Dutan Hematomas are gelatinous and   do not drain well via perc drain. If there is clinical concern of superinfection, we could offer US  aspiration of a sample. Thanks DDH  PS tried to [employer] but he not in Epic       Previous Messages    ----- Message ----- From: Dontarious Schaum Sent: 09/21/2023  12:54 PM EST To: Evalyse Stroope; Ir Procedure Requests Subject: CT GUIDED PERITONEAL/RETROPERITONEAL FLUID D*  Procedure: CT GUIDED PERITONEAL/RETROPERITONEAL FLUID DRAIN BY PERC CATH  Reason: Hematoma to be draied by Interventional radiology , left upper back thigh Dx: Hematoma [T14.8XXA (ICD-10-CM)]  History : US  lower left ext( non vascular)  Provider : Leonel Cole, MD  Provider contact : [phone]

## 2023-09-23 NOTE — Telephone Encounter (Signed)
 By law, everyone is required to wear a seat belt. No letter can be written to state that patient does not need to wear a seat belt as that would be against the law.  Mychart has been sent to pt making her aware that we cannot write a letter stating that she does not need to wear a seat belt.

## 2023-09-25 ENCOUNTER — Encounter (HOSPITAL_COMMUNITY): Payer: Self-pay | Admitting: Speech Pathology

## 2023-09-25 ENCOUNTER — Ambulatory Visit (HOSPITAL_COMMUNITY)
Admission: RE | Admit: 2023-09-25 | Discharge: 2023-09-25 | Disposition: A | Discharge status: Home / Self Care | Payer: HMO | Source: Ambulatory Visit | Attending: Family Medicine | Admitting: Family Medicine

## 2023-09-25 ENCOUNTER — Ambulatory Visit (HOSPITAL_COMMUNITY): Payer: HMO | Attending: Family Medicine | Admitting: Speech Pathology

## 2023-09-25 DIAGNOSIS — R1312 Dysphagia, oropharyngeal phase: Secondary | ICD-10-CM | POA: Diagnosis present

## 2023-09-25 DIAGNOSIS — R059 Cough, unspecified: Secondary | ICD-10-CM | POA: Diagnosis present

## 2023-09-25 DIAGNOSIS — K219 Gastro-esophageal reflux disease without esophagitis: Secondary | ICD-10-CM | POA: Diagnosis present

## 2023-09-25 NOTE — Therapy (Signed)
 Modified Barium Swallow Study  Patient Details  Name: [name] MRN: [id number] Date of Birth: 07/18/1941  Today's Date: 09/25/2023  HPI/PMH: HPI: [name] is an 83 yo female who was referred for MBSS by Cheryle Frees, MD. Pt was initially seen by ENT, Skotnicki, Gerard Caldron, DO for chronic throat clearing and she made a referral to SLP back in August. The patient deferred appointments and just recently rescheduled. Pt's primary report is chronic throat clearing.   Dr. Inez note from visit in August 2024: <<Cough, globus sensation, history of GERD Laryngoscopy formed today with findings as above, no evidence of vocal fold abnormality. True vocal fold movement is intact. Moderate interarytenoid edema is noted, consistent with patient's history of GERD. I recommended temporarily increasing PPI to twice daily dosing, and stopping nightly Pepcid . New prescription for pantoprazole  sent to patient's pharmacy. I also recommended referral to SLP for speech therapy due to patient's history of incessant throat clearing. We discussed dietary lifestyle modifications for treatment of reflux to include avoiding spicy and acidic foods which exacerbate reflux, avoiding late meals and snacking, avoiding caffeine  and stimulants, avoiding carbonated and alcoholic beverages, adequate oral hydration, avoidance of throat clearing and sleeping with the head of bed elevated.Procedure: After adequate topical anesthetic was applied, 4 mm flexible laryngoscope was passed through the nasal cavity without difficulty. Flexible laryngoscopy shows patent anterior nasal cavity with minimal crusting, no discharge or infection.  Moderate interarytenoid edema and erythema consistent with laryngopharyngeal reflux, otherwise normal base of tongue and supraglottis Normal vocal cord mobility without vocal cord nodule, mass, polyp or tumor. Hypopharynx normal without mass, pooling of secretions or aspiration.>>   Clinical  Impression: Clinical Impression: Pt presents with normal oropharyngeal swallow with prompt swallow trigger and normal hyolaryngeal excursion and laryngeal vestibule closure. Pt with trace/min vallecular residue with liquids, which clear with spontaneous repeat swallow. Pt was assessed with thin, nectar, puree, solids, and barium tablet. She was noted to have slightly prominent cricopharyngeus, however did not greatly impact bolus flow. The barium tablet was briefly delayed in the mid esophagus, but cleared with a dry swallow and liquid wash. Recommend regular textures and thin liquids with standard aspiration and reflux precautions and recommend outpatient SLP evaluation and treatment for chronic throat clearing follow up. Pt was noted to clear her throat every 30 seconds after the swallow study when we were reviewing the imaging. SLP reinforced the benefit of replacing the throat clearing with a swallow, a sip of liquid, or a quick cough or huh if necessary. Written information was provided to Pt and scheduling will follow up for visits.  Factors that may increase risk of adverse event in presence of aspiration Noe & Lianne 2021): No data recorded  Recommendations/Plan: Swallowing Evaluation Recommendations Swallowing Evaluation Recommendations Recommendations: PO diet PO Diet Recommendation: Regular; Thin liquids (Level 0) Liquid Administration via: Cup; Straw Medication Administration: Whole meds with liquid Supervision: Patient able to self-feed Postural changes: Position pt fully upright for meals; Stay upright 30-60 min after meals Oral care recommendations: Oral care BID (2x/day)    Treatment Plan Treatment Plan Treatment recommendations: Therapy as outlined in treatment plan below Follow-up recommendations: Outpatient SLP Recommendations Comment: outpatient SLP to treat chronic throat clearing 1-2 sessions Functional status assessment: Patient has not had a recent decline in  their functional status.     Recommendations Recommendations for follow up therapy are one component of a multi-disciplinary discharge planning process, led by the attending physician.  Recommendations may be updated based on  patient status, additional functional criteria and insurance authorization.  Assessment: Orofacial Exam: Orofacial Exam Oral Cavity: Oral Hygiene: WFL Oral Cavity - Dentition: Adequate natural dentition Orofacial Anatomy: WFL Oral Motor/Sensory Function: WFL    Anatomy:  Anatomy: WFL   Boluses Administered: Boluses Administered Boluses Administered: Thin liquids (Level 0); Mildly thick liquids (Level 2, nectar thick); Puree; Solid     Oral Impairment Domain: Oral Impairment Domain Lip Closure: No labial escape Tongue control during bolus hold: Cohesive bolus between tongue to palatal seal Bolus preparation/mastication: Slow prolonged chewing/mashing with complete recollection Bolus transport/lingual motion: Brisk tongue motion Oral residue: Trace residue lining oral structures Location of oral residue : Tongue Initiation of pharyngeal swallow : Valleculae     Pharyngeal Impairment Domain: Pharyngeal Impairment Domain Soft palate elevation: No bolus between soft palate (SP)/pharyngeal wall (PW) Laryngeal elevation: Complete superior movement of thyroid  cartilage with complete approximation of arytenoids to epiglottic petiole Anterior hyoid excursion: Complete anterior movement Epiglottic movement: Complete inversion Laryngeal vestibule closure: Complete, no air/contrast in laryngeal vestibule Pharyngeal stripping wave : Present - complete Pharyngeal contraction (A/P view only): Complete Pharyngoesophageal segment opening: Partial distention/partial duration, partial obstruction of flow Tongue base retraction: Trace column of contrast or air between tongue base and PPW Pharyngeal residue: Trace residue within or on pharyngeal structures Location  of pharyngeal residue: Valleculae     Esophageal Impairment Domain: Esophageal Impairment Domain Esophageal clearance upright position: Complete clearance, esophageal coating    Pill: Pill Consistency administered: Thin liquids (Level 0) Thin liquids (Level 0): [facility]    Penetration/Aspiration Scale Score: Penetration/Aspiration Scale Score 1.  Material does not enter airway: Thin liquids (Level 0); Mildly thick liquids (Level 2, nectar thick); Puree; Solid; Pill    Compensatory Strategies: Compensatory Strategies Compensatory strategies: Yes Multiple swallows: Effective (just to clear min pharyngeal residuals, Pt generally does spontaneously)       General Information: Caregiver present: No   Diet Prior to this Study: Regular; Thin liquids (Level 0)    Temperature : Normal    Respiratory Status: WFL    Supplemental O2: None (Room air)    History of Recent Intubation: No   Behavior/Cognition: Alert; Cooperative; Pleasant mood  Self-Feeding Abilities: Able to self-feed  Baseline vocal quality/speech: Normal  Volitional Cough: Able to elicit  Volitional Swallow: Able to elicit  Exam Limitations: No limitations   Pain: Pain Assessment Pain Assessment: No/denies pain    End of Session: Start Time:No data recorded Stop Time: No data recorded Time Calculation:No data recorded Charges: No data recorded SLP visit diagnosis: SLP Visit Diagnosis: Dysphagia, oropharyngeal phase (R13.12)    Past Medical History:  Past Medical History:  Diagnosis Date   Aortic atherosclerosis (HCC)    Asthma    Breast CA (HCC)    Cancer (HCC)    right breast mastectomy   CVA (cerebral vascular accident) (HCC) 04/27/2015   DDD (degenerative disc disease), cervical    Duodenal ulcer    External hemorrhoids    Generalized anxiety disorder    H/O chest pain 03/24/2006   normal nuclear  study   Headache    High cholesterol    Hypertension    Mild aortic  insufficiency 03/28/2006   echo   Normal cardiac stress test 2005   Pancreatitis    Personal history of chemotherapy    Personal history of radiation therapy    PONV (postoperative nausea and vomiting)    Sinus bradycardia on ECG    Stroke [facility])    Past Surgical  History:  Past Surgical History:  Procedure Laterality Date   ABDOMINAL HYSTERECTOMY     APPENDECTOMY     30 years ago   childbirth     x 2   CHOLECYSTECTOMY     15 years ago   ESOPHAGOGASTRODUODENOSCOPY N/A 03/04/2014   Procedure: ESOPHAGOGASTRODUODENOSCOPY (EGD);  Surgeon: Belvie JONETTA Just, MD;  Location: [facility] ENDOSCOPY;  Service: Endoscopy;  Laterality: N/A;   MASTECTOMY  1993   right breast   RIGHT/LEFT HEART CATH AND CORONARY ANGIOGRAPHY N/A 05/30/2019   Procedure: RIGHT/LEFT HEART CATH AND CORONARY ANGIOGRAPHY;  Surgeon: Dann Candyce RAMAN, MD;  Location: [facility] INVASIVE CV LAB;  Service: Cardiovascular;  Laterality: N/A;   TUBAL LIGATION     Thank you,  Lamar Candy, CCC-SLP [phone]  Edom Schmuhl 09/25/2023, 6:28 PM

## 2023-09-26 ENCOUNTER — Encounter (HOSPITAL_BASED_OUTPATIENT_CLINIC_OR_DEPARTMENT_OTHER): Payer: HMO | Admitting: General Surgery

## 2023-09-26 DIAGNOSIS — E11622 Type 2 diabetes mellitus with other skin ulcer: Secondary | ICD-10-CM | POA: Diagnosis not present

## 2023-09-27 NOTE — Progress Notes (Signed)
 Slates, Ross [name] ([id number]) [id number] Page 1 of 10 Visit Report for 09/26/2023 Arrival Information Details Patient Name: Date of Service: [name], [name] 09/26/2023 10:00 A M Medical Record Number: [id number] Patient Account Number: [id number] Date of Birth/Sex: Treating RN: 13-Feb-1941 (83 y.o. F) Primary Care Draydon Clairmont: Leonel Cole Other Clinician: Referring Rahmir Beever: Treating Lamesha Tibbits/Extender: Marolyn Delon Leonel Cole Devra in Treatment: 2 Visit Information History Since Last Visit Added or deleted any medications: No Patient Arrived: Ambulatory Any new allergies or adverse reactions: No Arrival Time: 09:53 Had a fall or experienced change in No Accompanied By: husband activities of daily living that may affect Transfer Assistance: None risk of falls: Patient Identification Verified: Yes Signs or symptoms of abuse/neglect since last visito No Secondary Verification Process Completed: Yes Hospitalized since last visit: No Patient Requires Transmission-Based Precautions: No Implantable device outside of the clinic excluding No Patient Has Alerts: Yes cellular tissue based products placed in the center Patient Alerts: Patient on Blood Thinner since last visit: Plavix  Pain Present Now: No Electronic Signature(s) Signed: 09/26/2023 3:55:42 PM By: Okey Bonier Entered By: Okey Bonier on 09/26/2023 09:53:55 -------------------------------------------------------------------------------- Clinic Level of Care Assessment Details Patient Name: Date of Service: [name], [name] 09/26/2023 10:00 A M Medical Record Number: [id number] Patient Account Number: [id number] Date of Birth/Sex: Treating RN: 28-Sep-1940 (83 y.o. JEANELL Claven Pollen Primary Care Maudell Stanbrough: Leonel Cole Other Clinician: Referring Elna Radovich: Treating Arjay Jaskiewicz/Extender: Marolyn Delon Leonel Cole Devra in Treatment: 2 Clinic Level of Care Assessment Items TOOL 4 Quantity Score X- 1 0 Use  when only an EandM is performed on FOLLOW-UP visit ASSESSMENTS - Nursing Assessment / Reassessment X- 1 10 Reassessment of Co-morbidities (includes updates in patient status) X- 1 5 Reassessment of Adherence to Treatment Plan ASSESSMENTS - Wound and Skin A ssessment / Reassessment []  - 0 Simple Wound Assessment / Reassessment - one wound X- 2 5 Complex Wound Assessment / Reassessment - multiple wounds []  - 0 Dermatologic / Skin Assessment (not related to wound area) ASSESSMENTS - Focused Assessment []  - 0 Circumferential Edema Measurements - multi extremities []  - 0 Nutritional Assessment / Counseling / Intervention []  - 0 Lower Extremity Assessment (monofilament, tuning fork, pulses) Kesling, Aireona [name] ([id number]) [id number] Page 2 of 10 []  - 0 Peripheral Arterial Disease Assessment (using hand held doppler) ASSESSMENTS - Ostomy and/or Continence Assessment and Care []  - 0 Incontinence Assessment and Management []  - 0 Ostomy Care Assessment and Management (repouching, etc.) PROCESS - Coordination of Care X - Simple Patient / Family Education for ongoing care 1 15 []  - 0 Complex (extensive) Patient / Family Education for ongoing care X- 1 10 Staff obtains Chiropractor, Records, T Results / Process Orders est X- 1 10 Staff telephones HHA, Nursing Homes / Clarify orders / etc []  - 0 Routine Transfer to another Facility (non-emergent condition) []  - 0 Routine Hospital Admission (non-emergent condition) []  - 0 New Admissions / Manufacturing Engineer / Ordering NPWT Apligraf, etc. , []  - 0 Emergency Hospital Admission (emergent condition) X- 1 10 Simple Discharge Coordination []  - 0 Complex (extensive) Discharge Coordination PROCESS - Special Needs []  - 0 Pediatric / Minor Patient Management []  - 0 Isolation Patient Management []  - 0 Hearing / Language / Visual special needs []  - 0 Assessment of Community assistance (transportation, D/C  planning, etc.) []  - 0 Additional assistance / Altered mentation []  - 0 Support Surface(s) Assessment (bed, cushion, seat, etc.) INTERVENTIONS - Wound Cleansing / Measurement []  - 0 Simple Wound Cleansing - one wound X-  2 5 Complex Wound Cleansing - multiple wounds X- 1 5 Wound Imaging (photographs - any number of wounds) []  - 0 Wound Tracing (instead of photographs) []  - 0 Simple Wound Measurement - one wound X- 2 5 Complex Wound Measurement - multiple wounds INTERVENTIONS - Wound Dressings []  - 0 Small Wound Dressing one or multiple wounds X- 2 15 Medium Wound Dressing one or multiple wounds []  - 0 Large Wound Dressing one or multiple wounds []  - 0 Application of Medications - topical []  - 0 Application of Medications - injection INTERVENTIONS - Miscellaneous []  - 0 External ear exam []  - 0 Specimen Collection (cultures, biopsies, blood, body fluids, etc.) []  - 0 Specimen(s) / Culture(s) sent or taken to Lab for analysis []  - 0 Patient Transfer (multiple staff / Nurse, Adult / Similar devices) X- 1 5 Simple Staple / Suture removal (25 or less) []  - 0 Complex Staple / Suture removal (26 or more) []  - 0 Hypo / Hyperglycemic Management (close monitor of Blood Glucose) []  - 0 Ankle / Brachial Index (ABI) - do not check if billed separately [name], [name] ([id number]) [id number] Page 3 of 10 X- 1 5 Vital Signs Has the patient been seen at the hospital within the last three years: Yes Total Score: 135 Level Of Care: New/Established - Level 4 Electronic Signature(s) Signed: 09/26/2023 11:23:18 AM By: Claven Pollen RN Entered By: Claven Pollen on 09/26/2023 10:26:31 -------------------------------------------------------------------------------- Encounter Discharge Information Details Patient Name: Date of Service: Leslie [name]. 09/26/2023 10:00 A M Medical Record Number: [id number] Patient Account Number: [id number] Date of Birth/Sex:  Treating RN: 08-24-1941 (83 y.o. JEANELL Claven Pollen Primary Care Amely Voorheis: Leonel Cole Other Clinician: Referring Jaymin Waln: Treating Sanad Fearnow/Extender: Marolyn Delon Leonel Cole Devra in Treatment: 2 Encounter Discharge Information Items Discharge Condition: Stable Ambulatory Status: Ambulatory Discharge Destination: Home Transportation: Private Auto Accompanied By: spouse Schedule Follow-up Appointment: Yes Clinical Summary of Care: Patient Declined Electronic Signature(s) Signed: 09/26/2023 11:23:18 AM By: Claven Pollen RN Entered By: Claven Pollen on 09/26/2023 11:19:57 -------------------------------------------------------------------------------- Lower Extremity Assessment Details Patient Name: Date of Service: NIVEA, WOJDYLA 09/26/2023 10:00 A M Medical Record Number: [id number] Patient Account Number: [id number] Date of Birth/Sex: Treating RN: May 11, 1941 (83 y.o. JEANELL Claven Pollen Primary Care Dorisann Schwanke: Leonel Cole Other Clinician: Referring Canyon Willow: Treating Debbora Ang/Extender: Marolyn Delon Leonel Cole Devra in Treatment: 2 Electronic Signature(s) Signed: 09/26/2023 11:23:18 AM By: Claven Pollen RN Entered By: Claven Pollen on 09/26/2023 10:05:17 -------------------------------------------------------------------------------- Multi Wound Chart Details Patient Name: Date of Service: Leslie Leslie HAMS 09/26/2023 10:00 A M Medical Record Number: [id number] Patient Account Number: [id number] Date of Birth/Sex: Treating RN: 11-04-1940 (83 y.o. F) Goyne, Stacye [name] ([id number]) [id number] Page 4 of 10 Primary Care Alfreida Steffenhagen: Leonel Cole Other Clinician: Referring Adonis Yim: Treating Lavaya Defreitas/Extender: Marolyn Delon Leonel Cole Devra in Treatment: 2 Vital Signs Height(in): 65 Pulse(bpm): 86 Weight(lbs): 148 Blood Pressure(mmHg): 172/81 Body Mass Index(BMI): 24.6 Temperature(F): 97.5 Respiratory Rate(breaths/min): 18 [1:Photos:]  [N/A:N/A] Left Hand - Dorsum Left Forearm N/A Wound Location: Trauma Trauma N/A Wounding Event: Skin T ear Abrasion N/A Primary Etiology: Asthma, Congestive Heart Failure, Asthma, Congestive Heart Failure, N/A Comorbid History: Hypertension, Type II Diabetes Hypertension, Type II Diabetes 09/10/2023 09/10/2023 N/A Date Acquired: 2 2 N/A Weeks of Treatment: Open Open N/A Wound Status: No No N/A Wound Recurrence: 0.1x0.1x0.1 0.1x0.1x0.1 N/A Measurements L x W x D (cm) 0.008 0.008 N/A A (cm) : rea 0.001 0.001 N/A Volume (cm) : 99.90% 100.00% N/A % Reduction in Area: 99.80% 100.00% N/A % Reduction  in Volume: Full Thickness Without Exposed Full Thickness Without Exposed N/A Classification: Support Structures Support Structures Medium Medium N/A Exudate A mount: Serosanguineous Serosanguineous N/A Exudate Type: red, brown red, brown N/A Exudate Color: Distinct, outline attached Distinct, outline attached N/A Wound Margin: None Present (0%) None Present (0%) N/A Granulation Amount: Large (67-100%) Large (67-100%) N/A Necrotic Amount: Eschar Eschar N/A Necrotic Tissue: Fat Layer (Subcutaneous Tissue): Yes Fat Layer (Subcutaneous Tissue): Yes N/A Exposed Structures: Fascia: No Fascia: No Tendon: No Tendon: No Muscle: No Muscle: No Joint: No Joint: No Bone: No Bone: No Small (1-33%) Medium (34-66%) N/A Epithelialization: Excoriation: No Excoriation: No N/A Periwound Skin Texture: Induration: No Induration: No Callus: No Callus: No Crepitus: No Crepitus: No Rash: No Rash: No Scarring: No Scarring: No Maceration: No Maceration: No N/A Periwound Skin Moisture: Dry/Scaly: No Dry/Scaly: No Atrophie Blanche: No Ecchymosis: Yes N/A Periwound Skin Color: Cyanosis: No Atrophie Blanche: No Ecchymosis: No Cyanosis: No Erythema: No Erythema: No Hemosiderin Staining: No Hemosiderin Staining: No Mottled: No Mottled: No Pallor: No Pallor: No Rubor:  No Rubor: No No Abnormality No Abnormality N/A Temperature: Yes Yes N/A Tenderness on Palpation: Treatment Notes Electronic Signature(s) Signed: 09/26/2023 10:10:53 AM By: Marolyn Nest MD FACS Entered By: Marolyn Nest on 09/26/2023 10:10:53 [name], [name] ([id number]) [id number] Page 5 of 10 -------------------------------------------------------------------------------- Multi-Disciplinary Care Plan Details Patient Name: Date of Service: [name], [name]. 09/26/2023 10:00 A M Medical Record Number: [id number] Patient Account Number: [id number] Date of Birth/Sex: Treating RN: 07-Jan-1941 (83 y.o. JEANELL Claven Pollen Primary Care Berdella Bacot: Leonel Cole Other Clinician: Referring Pape Parson: Treating Krystyne Tewksbury/Extender: Marolyn Nest Leonel Cole Devra in Treatment: 2 Multidisciplinary Care Plan reviewed with physician Active Inactive Pain, Acute or Chronic Nursing Diagnoses: Pain, acute or chronic: actual or potential Goals: Patient will verbalize adequate pain control and receive pain control interventions during procedures as needed Date Initiated: 09/12/2023 Target Resolution Date: 10/20/2023 Goal Status: Active Patient/caregiver will verbalize comfort level met Date Initiated: 09/12/2023 Target Resolution Date: 10/20/2023 Goal Status: Active Interventions: Encourage patient to take pain medications as prescribed Provide education on pain management Reposition patient for comfort Treatment Activities: Administer pain control measures as ordered : 09/12/2023 Notes: Wound/Skin Impairment Nursing Diagnoses: Knowledge deficit related to ulceration/compromised skin integrity Goals: Patient/caregiver will verbalize understanding of skin care regimen Date Initiated: 09/12/2023 Target Resolution Date: 10/20/2023 Goal Status: Active Interventions: Assess patient/caregiver ability to perform ulcer/skin care regimen upon admission and as needed Assess  ulceration(s) every visit Provide education on ulcer and skin care Treatment Activities: Skin care regimen initiated : 09/12/2023 Topical wound management initiated : 09/12/2023 Notes: Electronic Signature(s) Signed: 09/26/2023 11:23:18 AM By: Claven Pollen RN Entered By: Claven Pollen on 09/26/2023 11:18:50 [name], [name] ([id number]) [id number] Page 6 of 10 -------------------------------------------------------------------------------- Pain Assessment Details Patient Name: Date of Service: [name], [name]. 09/26/2023 10:00 A M Medical Record Number: [id number] Patient Account Number: [id number] Date of Birth/Sex: Treating RN: 1941/06/06 (83 y.o. F) Primary Care Isabellamarie Randa: Leonel Cole Other Clinician: Referring Allahna Husband: Treating Cherina Dhillon/Extender: Marolyn Nest Leonel Cole Devra in Treatment: 2 Active Problems Location of Pain Severity and Description of Pain Patient Has Paino No Site Locations Pain Management and Medication Current Pain Management: Electronic Signature(s) Signed: 09/26/2023 3:55:42 PM By: Okey Bonier Entered By: Okey Bonier on 09/26/2023 09:54:26 -------------------------------------------------------------------------------- Patient/Caregiver Education Details Patient Name: Date of Service: Leslie Leslie HAMS 1/14/2025andnbsp10:00 A M Medical Record Number: [id number] Patient Account Number: [id number] Date of Birth/Gender: Treating RN: 08/25/1941 (83 y.o. JEANELL Claven Pollen Primary Care Physician: Leonel Cole Other  Clinician: Referring Physician: Treating Physician/Extender: Marolyn Delon Leonel Cheryle Devra in Treatment: 2 Education Assessment Education Provided To: Patient Education Topics Provided Wound/Skin Impairment: Methods: Explain/Verbal Responses: State content correctly Electronic Signature(s) Signed: 09/26/2023 11:23:18 AM By: Claven Pollen RN Entered By: Claven Pollen on 09/26/2023 11:19:27 [name], [name]  ([id number]) [id number] Page 7 of 10 -------------------------------------------------------------------------------- Wound Assessment Details Patient Name: Date of Service: [name], [name]. 09/26/2023 10:00 A M Medical Record Number: [id number] Patient Account Number: [id number] Date of Birth/Sex: Treating RN: 05/10/1941 (83 y.o. F) Primary Care Jurgen Groeneveld: Leonel Cheryle Other Clinician: Referring Nithya Meriweather: Treating Cray Monnin/Extender: Marolyn Delon Leonel Cheryle Devra in Treatment: 2 Wound Status Wound Number: 1 Primary Skin Tear Etiology: Wound Location: Left Hand - Dorsum Wound Status: Open Wounding Event: Trauma Comorbid Asthma, Congestive Heart Failure, Hypertension, Type II Date Acquired: 09/10/2023 History: Diabetes Weeks Of Treatment: 2 Clustered Wound: No Photos Wound Measurements Length: (cm) 0.1 Width: (cm) 0.1 Depth: (cm) 0.1 Area: (cm) 0.008 Volume: (cm) 0.001 % Reduction in Area: 99.9% % Reduction in Volume: 99.8% Epithelialization: Small (1-33%) Tunneling: No Undermining: No Wound Description Classification: Full Thickness Without Exposed Support Wound Margin: Distinct, outline attached Exudate Amount: Medium Exudate Type: Serosanguineous Exudate Color: red, brown Structures Foul Odor After Cleansing: No Slough/Fibrino No Wound Bed Granulation Amount: None Present (0%) Exposed Structure Necrotic Amount: Large (67-100%) Fascia Exposed: No Necrotic Quality: Eschar Fat Layer (Subcutaneous Tissue) Exposed: Yes Tendon Exposed: No Muscle Exposed: No Joint Exposed: No Bone Exposed: No Periwound Skin Texture Texture Color No Abnormalities Noted: No No Abnormalities Noted: Yes Callus: No Temperature / Pain Crepitus: No Temperature: No Abnormality Excoriation: No Tenderness on Palpation: Yes Induration: No Rash: No Scarring: No Moisture No Abnormalities Noted: No Dry / Scaly: No [name], [name] ([id number])  [id number] Page 8 of 10 Maceration: No Treatment Notes Wound #1 (Hand - Dorsum) Wound Laterality: Left Cleanser Soap and Water Discharge Instruction: May shower and wash wound with dial antibacterial soap and water prior to dressing change. Peri-Wound Care Topical Primary Dressing Xeroform Occlusive Gauze Dressing, 4x4 in Discharge Instruction: Apply to wound bed as instructed Secondary Dressing Woven Gauze Sponge, Non-Sterile 4x4 in Discharge Instruction: Apply over primary dressing as directed. Secured With [employer], 4.5x3.1 (in/yd) Discharge Instruction: Secure with Kerlix as directed. 32M Medipore H Soft Cloth Surgical T ape, 4 x 10 (in/yd) Discharge Instruction: Secure with tape as directed. Elastic Net Retention Dressing, Size 5, 12x25 (in/yd) Discharge Instruction: Apply over the kerlix to secure in place. Compression Wrap Compression Stockings Add-Ons Electronic Signature(s) Signed: 09/26/2023 11:23:18 AM By: Claven Pollen RN Entered By: Claven Pollen on 09/26/2023 10:05:47 -------------------------------------------------------------------------------- Wound Assessment Details Patient Name: Date of Service: Leslie Leslie HAMS 09/26/2023 10:00 A M Medical Record Number: [id number] Patient Account Number: [id number] Date of Birth/Sex: Treating RN: [birth year] (83 y.o. F) Primary Care Cashus Halterman: Leonel Cheryle Other Clinician: Referring Briaunna Grindstaff: Treating Jayah Balthazar/Extender: Marolyn Delon Leonel Cheryle Devra in Treatment: 2 Wound Status Wound Number: 2 Primary Abrasion Etiology: Wound Location: Left Forearm Wound Status: Open Wounding Event: Trauma Comorbid Asthma, Congestive Heart Failure, Hypertension, Type II Date Acquired: 09/10/2023 History: Diabetes Weeks Of Treatment: 2 Clustered Wound: No Photos [name], [name] ([id number]) [id number] Page 9 of 10 Wound Measurements Length: (cm) 0.1 Width: (cm)  0.1 Depth: (cm) 0.1 Area: (cm) 0.008 Volume: (cm) 0.001 % Reduction in Area: 100% % Reduction in Volume: 100% Epithelialization: Medium (34-66%) Tunneling: No Undermining: No Wound Description Classification: Full Thickness Without Exposed Support Structures Wound Margin: Distinct, outline attached Exudate Amount: Medium  Exudate Type: Serosanguineous Exudate Color: red, brown Foul Odor After Cleansing: No Slough/Fibrino No Wound Bed Granulation Amount: None Present (0%) Exposed Structure Necrotic Amount: Large (67-100%) Fascia Exposed: No Necrotic Quality: Eschar Fat Layer (Subcutaneous Tissue) Exposed: Yes Tendon Exposed: No Muscle Exposed: No Joint Exposed: No Bone Exposed: No Periwound Skin Texture Texture Color No Abnormalities Noted: No No Abnormalities Noted: No Callus: No Atrophie Blanche: No Crepitus: No Cyanosis: No Excoriation: No Ecchymosis: Yes Induration: No Erythema: No Rash: No Hemosiderin Staining: No Scarring: No Mottled: No Pallor: No Moisture Rubor: No No Abnormalities Noted: No Dry / Scaly: No Temperature / Pain Maceration: No Temperature: No Abnormality Tenderness on Palpation: Yes Treatment Notes Wound #2 (Forearm) Wound Laterality: Left Cleanser Soap and Water Discharge Instruction: May shower and wash wound with dial antibacterial soap and water prior to dressing change. Peri-Wound Care Topical Primary Dressing Xeroform Occlusive Gauze Dressing, 4x4 in Discharge Instruction: Apply to wound bed as instructed Secondary Dressing Woven Gauze Sponge, Non-Sterile 4x4 in Discharge Instruction: Apply over primary dressing as directed. Secured With [employer], 4.5x3.1 (in/yd) Discharge Instruction: Secure with Kerlix as directed. [place], Lamika [name] ([id number]) [id number] Page 10 of 10 10M Medipore H Soft Cloth Surgical T ape, 4 x 10 (in/yd) Discharge Instruction: Secure with tape as directed. Elastic  Net Retention Dressing, Size 5, 12x25 (in/yd) Discharge Instruction: Apply over the kerlix to secure in place. Compression Wrap Compression Stockings Add-Ons Electronic Signature(s) Signed: 09/26/2023 11:23:18 AM By: Claven Pollen RN Entered By: Claven Pollen on 09/26/2023 10:06:29 -------------------------------------------------------------------------------- Vitals Details Patient Name: Date of Service: Leslie [name]. 09/26/2023 10:00 A M Medical Record Number: [id number] Patient Account Number: [id number] Date of Birth/Sex: Treating RN: [birth year] (83 y.o. F) Primary Care Valbona Slabach: Leonel Cole Other Clinician: Referring Aroush Chasse: Treating Kynzee Devinney/Extender: Marolyn Delon Leonel Cole Devra in Treatment: 2 Vital Signs Time Taken: 09:53 Temperature (F): 97.5 Height (in): 65 Pulse (bpm): 86 Weight (lbs): 148 Respiratory Rate (breaths/min): 18 Body Mass Index (BMI): 24.6 Blood Pressure (mmHg): 172/81 Reference Range: 80 - 120 mg / dl Electronic Signature(s) Signed: 09/26/2023 3:55:42 PM By: Okey Bonier Entered By: Okey Bonier on 09/26/2023 09:54:20

## 2023-09-27 NOTE — Progress Notes (Signed)
 Marland Silvas, MD  Joelle Musca PROCEDURE / BIOPSY REVIEW Date: 09/27/23  Requested Biopsy site: L thigh hematoma Reason for request: aspirate dry if possible, r/o infection Imaging review: Best seen on US  09/15/23  Decision: Approved Imaging modality to perform: Ultrasound Schedule with: Patient preference (Local vs Mod Sed) Schedule for: Any VIR  Additional comments:   Please contact me with questions, concerns, or if issue pertaining to this request arise.  Dayne Marland Silvas, MD [facility]       Previous Messages    ----- Message ----- From: Aedin Jeansonne Sent: 09/26/2023   2:35 PM EST To: Marland Silvas, MD; Joelle Musca Subject: RE: CT GUIDED PERITONEAL/RETROPERITONEAL FLU*  I had order changed and ofc faxed us  new order for US  aspiration.  - new order under media  Does this require mod sedation or local ? ----- Message ----- From: Marland Silvas, MD Sent: 09/22/2023   8:28 AM EST To: Joelle Musca Subject: RE: CT GUIDED PERITONEAL/RETROPERITONEAL FLU*  Hematomas are gelatinous and   do not drain well via perc drain. If there is clinical concern of superinfection, we could offer US  aspiration of a sample. Thanks DDH  PS tried to [employer] but he not in Epic ----- Message ----- From: Jacoba Cherney Sent: 09/21/2023  12:54 PM EST To: Evonna Stoltz; Ir Procedure Requests Subject: CT GUIDED PERITONEAL/RETROPERITONEAL FLUID D*  Procedure: CT GUIDED PERITONEAL/RETROPERITONEAL FLUID DRAIN BY PERC CATH  Reason: Hematoma to be draied by Interventional radiology , left upper back thigh Dx: Hematoma [T14.8XXA (ICD-10-CM)]  History : US  lower left ext( non vascular)  Provider : Benedetto Brady, MD  Provider contact : [phone]

## 2023-09-27 NOTE — Progress Notes (Signed)
 Dubuc, Xiadani [name] ([id number]) [id number] Page 1 of 6 Visit Report for 09/26/2023 Chief Complaint Document Details Patient Name: Date of Service: [name], [name] 09/26/2023 10:00 A M Medical Record Number: [id number] Patient Account Number: [id number] Date of Birth/Sex: Treating RN: 07/30/1941 (83 y.o. F) Primary Care Provider: Leonel Cole Other Clinician: Referring Provider: Treating Provider/Extender: Marolyn Delon Leonel Cole Devra in Treatment: 2 Information Obtained from: Patient Chief Complaint Patient seen for complaints of Non-Healing Wound. Electronic Signature(s) Signed: 09/26/2023 10:29:37 AM By: Marolyn Delon MD FACS Entered By: Marolyn Delon on 09/26/2023 10:29:37 -------------------------------------------------------------------------------- HPI Details Patient Name: Date of Service: [name] 09/26/2023 10:00 A M Medical Record Number: [id number] Patient Account Number: [id number] Date of Birth/Sex: Treating RN: 09/17/1940 (83 y.o. F) Primary Care Provider: Leonel Cole Other Clinician: Referring Provider: Treating Provider/Extender: Marolyn Delon Leonel Cole Devra in Treatment: 2 History of Present Illness HPI Description: ADMISSION 09/12/2023 This is an 83 year old woman with a history of poorly controlled type 2 diabetes (last available hemoglobin A1c 10.4%, about 1 year ago). She suffered a fall that resulted in a large skin tear on her arm and dorsal hand. She was referred to the wound care center for further evaluation and management of the wound. 09/19/2023: The majority of the arm has healed with just 2 open areas near the elbow. The sutures in the elbow are intact. The dorsum of her hand has eschar over it, the wound is quite a bit smaller with just some slough on the surface. 09/26/2023: The entire posterior arm has healed. The dorsal surface of her hand has just a small linear opening. The sutures remain intact and the  elbow and she is here to have those removed today. Electronic Signature(s) Signed: 09/26/2023 10:34:05 AM By: Marolyn Delon MD FACS Entered By: Marolyn Delon on 09/26/2023 10:34:04 -------------------------------------------------------------------------------- Physical Exam Details Patient Name: Date of Service: [name] 09/26/2023 10:00 A ANIKKA, MARSAN [name] ([id number]) [id number] Page 2 of 6 Medical Record Number: [id number] Patient Account Number: [id number] Date of Birth/Sex: Treating RN: 29-Jan-1941 (83 y.o. F) Primary Care Provider: Leonel Cole Other Clinician: Referring Provider: Treating Provider/Extender: Marolyn Delon Leonel Cole Devra in Treatment: 2 Constitutional Hypertensive, asymptomatic. . . . no acute distress. Respiratory Normal work of breathing on room air.. Notes 09/26/2023: The entire posterior arm has healed. The dorsal surface of her hand has just a small linear opening. The sutures remain intact in the elbow and she is here to have those removed today. Electronic Signature(s) Signed: 09/26/2023 10:34:57 AM By: Marolyn Delon MD FACS Entered By: Marolyn Delon on 09/26/2023 10:34:57 -------------------------------------------------------------------------------- Physician Orders Details Patient Name: Date of Service: [name] 09/26/2023 10:00 A M Medical Record Number: [id number] Patient Account Number: [id number] Date of Birth/Sex: Treating RN: 04/28/1941 (83 y.o. 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 - Dr. Marolyn 10/03/23 at 9:30am (room 3) Return A Anesthetic Wound #1 Left Hand - Dorsum (In clinic) Topical Lidocaine  5% applied to wound bed (In clinic) Topical Lidocaine  4% applied to wound bed Wound #2 Left Forearm (In clinic) Topical Lidocaine  5% applied to wound bed (In clinic) Topical Lidocaine  4% applied to wound bed Bathing/  Shower/ Hygiene May shower and wash wound with soap and water. - gently wash with soap and water when changing the dressing. Edema Control - Orders / Instructions Other Edema Control Orders/Instructions: - elevate the left hand and arm when sitting in chair to aid in swelling. Wound Treatment Wound #1 - Hand - Dorsum Wound Laterality: Left Cleanser: Soap and Water Every Other Day/30 Days Discharge Instructions: May shower and wash wound with dial antibacterial soap and water prior to dressing change. Prim Dressing: Xeroform Occlusive Gauze Dressing, 4x4 in (Generic) Every Other Day/30 Days ary Discharge Instructions: Apply to wound bed as instructed Secondary Dressing: Woven Gauze Sponge, Non-Sterile 4x4 in (Generic) Every Other Day/30 Days Discharge Instructions: Apply over primary dressing as directed. [name] ([id number]) [id number] Page 3 of 6 Secured With: [employer], 4.5x3.1 (in/yd) (Generic) Every Other Day/30 Days Discharge Instructions: Secure with Kerlix as directed. Secured With: 72M Medipore H Soft Cloth Surgical T ape, 4 x 10 (in/yd) (Generic) Every Other Day/30 Days Discharge Instructions: Secure with tape as directed. Secured With: [employer] Scientist Dressing, Size 5, 12x25 (in/yd) (Generic) Every Other Day/30 Days Discharge Instructions: Apply over the kerlix to secure in place. Wound #2 - Forearm Wound Laterality: Left Cleanser: Soap and Water Every Other Day/30 Days Discharge Instructions: May shower and wash wound with dial antibacterial soap and water prior  to dressing change. Prim Dressing: Xeroform Occlusive Gauze Dressing, 4x4 in (Generic) Every Other Day/30 Days ary Discharge Instructions: Apply to wound bed as instructed Secondary Dressing: Woven Gauze Sponge, Non-Sterile 4x4 in (Generic) Every Other Day/30 Days Discharge Instructions: Apply over primary dressing as directed. Secured With: [employer], 4.5x3.1 (in/yd) (Generic) Every Other Day/30 Days Discharge Instructions: Secure with Kerlix as directed. Secured With: 72M Medipore H Soft Cloth Surgical T ape, 4 x 10 (in/yd) (Generic) Every Other Day/30 Days Discharge Instructions: Secure with tape as directed. Secured With: [employer]ist Dressing, Size 5, 12x25 (in/yd) (Generic) Every Other Day/30 Days Discharge Instructions: Apply over the kerlix to secure in place. Electronic Signature(s) Signed: 09/26/2023 10:47:41 AM By: Marolyn Nest MD FACS Entered By: Marolyn Nest on 09/26/2023 10:41:04 -------------------------------------------------------------------------------- Problem List Details Patient Name: Date of Service: [name] 09/26/2023 10:00 A M Medical Record Number: [id number] Patient Account Number: [id number] Date of Birth/Sex: Treating RN: 1941/05/29 (83 y.o. 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 09/12/2023 No Yes Z48.02 Encounter for removal of sutures 09/26/2023 No Yes E11.622 Type 2 diabetes mellitus with other skin ulcer 09/12/2023 No Yes I50.32 Chronic diastolic (congestive) heart failure 09/12/2023 No Yes Inactive Problems Lebarron, Malan [name] ([id number]) [id number] Page 4 of 6 Resolved Problems Electronic Signature(s) Signed: 09/26/2023  10:29:20 AM By: Marolyn Nest MD FACS Previous Signature: 09/26/2023 10:10:31 AM Version By: Marolyn Nest MD FACS Entered By: Marolyn Nest on 09/26/2023 10:29:20 -------------------------------------------------------------------------------- Progress Note Details Patient Name: Date of Service: [name] 09/26/2023 10:00 A M Medical Record Number: [id number] Patient Account Number: [id number] Date of Birth/Sex: Treating RN: 1941/01/15 (83 y.o. F) Primary Care Provider: Leonel Cole Other Clinician: Referring Provider: Treating Provider/Extender: Marolyn Nest Leonel Cole Devra in Treatment: 2 Subjective Chief Complaint Information obtained from Patient Patient seen for complaints of Non-Healing Wound. History of Present Illness (HPI) ADMISSION 09/12/2023 This is an 83 year old woman with a history  of poorly controlled type 2 diabetes (last available hemoglobin A1c 10.4%, about 1 year ago). She suffered a fall that resulted in a large skin tear on her arm and dorsal hand. She was referred to the wound care center for further evaluation and management of the wound. 09/19/2023: The majority of the arm has healed with just 2 open areas near the elbow. The sutures in the elbow are intact. The dorsum of her hand has eschar over it, the wound is quite a bit smaller with just some slough on the surface. 09/26/2023: The entire posterior arm has healed. The dorsal surface of her hand has just a small linear opening. The sutures remain intact and the elbow and she is here to have those removed today. Objective Constitutional Hypertensive, asymptomatic. no acute distress. Vitals Time Taken: 9:53 AM, Height: 65 in, Weight: 148 lbs, BMI: 24.6, Temperature: 97.5 F, Pulse: 86 bpm, Respiratory Rate: 18 breaths/min, Blood Pressure: 172/81 mmHg. Respiratory Normal work of breathing on room air.. General Notes: 09/26/2023: The entire posterior arm has healed. The dorsal surface of her hand  has just a small linear opening. The sutures remain intact in the elbow and she is here to have those removed today. Integumentary (Hair, Skin) Wound #1 status is Open. Original cause of wound was Trauma. The date acquired was: 09/10/2023. The wound has been in treatment 2 weeks. The wound is located on the Left Hand - Dorsum. The wound measures 0.1cm length x 0.1cm width x 0.1cm depth; 0.008cm^2 area and 0.001cm^3 volume. There is Fat Layer (Subcutaneous Tissue) exposed. There is no tunneling or undermining noted. There is a medium amount of serosanguineous drainage noted. The wound margin is distinct with the outline attached to the wound base. There is no granulation within the wound bed. There is a large (67-100%) amount of necrotic tissue within the wound bed including Eschar. The periwound skin appearance had no abnormalities noted for color. The periwound skin appearance did not exhibit: Callus, Crepitus, Excoriation, Induration, Rash, Scarring, Dry/Scaly, Maceration. Periwound temperature was noted as No Abnormality. The periwound has tenderness on palpation. Wound #2 status is Open. Original cause of wound was Trauma. The date acquired was: 09/10/2023. The wound has been in treatment 2 weeks. The wound is located on the Left Forearm. The wound measures 0.1cm length x 0.1cm width x 0.1cm depth; 0.008cm^2 area and 0.001cm^3 volume. There is Fat Layer (Subcutaneous Tissue) exposed. There is no tunneling or undermining noted. There is a medium amount of serosanguineous drainage noted. The wound margin is distinct with the outline attached to the wound base. There is no granulation within the wound bed. There is a large (67-100%) amount of necrotic tissue within the wound bed including Eschar. The periwound skin appearance exhibited: Ecchymosis. The periwound skin appearance did not exhibit: Callus, Crepitus, Excoriation, Induration, Rash, Scarring, Dry/Scaly, Maceration, Atrophie Blanche,  Cyanosis, Hemosiderin Staining, Mottled, Pallor, Rubor, Erythema. Periwound temperature was noted as No Abnormality. The periwound has tenderness on palpation. Vanhecke, Evelyna [name] ([id number]) [id number] Page 5 of 6 Assessment Active Problems ICD-10 Non-pressure chronic ulcer of skin of other sites with unspecified severity Encounter for removal of sutures Type 2 diabetes mellitus with other skin ulcer Chronic diastolic (congestive) heart failure Plan Follow-up Appointments: Return Appointment in 1 week. - Dr. Marolyn 10/03/23 at 9:30am (room 3) Anesthetic: Wound #1 Left Hand - Dorsum: (In clinic) Topical Lidocaine  5% applied to wound bed (In clinic) Topical Lidocaine  4% applied to wound bed Wound #2 Left Forearm: (In clinic) Topical  Lidocaine  5% applied to wound bed (In clinic) Topical Lidocaine  4% applied to wound bed Bathing/ Shower/ Hygiene: May shower and wash wound with soap and water. - gently wash with soap and water when changing the dressing. Edema Control - Orders / Instructions: Other Edema Control Orders/Instructions: - elevate the left hand and arm when sitting in chair to aid in swelling. WOUND #1: - Hand - Dorsum Wound Laterality: Left Cleanser: Soap and Water Every Other Day/30 Days Discharge Instructions: May shower and wash wound with dial antibacterial soap and water prior to dressing change. Prim Dressing: Xeroform Occlusive Gauze Dressing, 4x4 in (Generic) Every Other Day/30 Days ary Discharge Instructions: Apply to wound bed as instructed Secondary Dressing: Woven Gauze Sponge, Non-Sterile 4x4 in (Generic) Every Other Day/30 Days Discharge Instructions: Apply over primary dressing as directed. Secured With: [employer], 4.5x3.1 (in/yd) (Generic) Every Other Day/30 Days Discharge Instructions: Secure with Kerlix as directed. Secured With: 30M Medipore H Soft Cloth Surgical T ape, 4 x 10 (in/yd) (Generic) Every Other Day/30  Days Discharge Instructions: Secure with tape as directed. Secured With: [employer]ist Dressing, Size 5, 12x25 (in/yd) (Generic) Every Other Day/30 Days Discharge Instructions: Apply over the kerlix to secure in place. WOUND #2: - Forearm Wound Laterality: Left Cleanser: Soap and Water Every Other Day/30 Days Discharge Instructions: May shower and wash wound with dial antibacterial soap and water prior to dressing change. Prim Dressing: Xeroform Occlusive Gauze Dressing, 4x4 in (Generic) Every Other Day/30 Days ary Discharge Instructions: Apply to wound bed as instructed Secondary Dressing: Woven Gauze Sponge, Non-Sterile 4x4 in (Generic) Every Other Day/30 Days Discharge Instructions: Apply over primary dressing as directed. Secured With: [employer], 4.5x3.1 (in/yd) (Generic) Every Other Day/30 Days Discharge Instructions: Secure with Kerlix as directed. Secured With: 30M Medipore H Soft Cloth Surgical T ape, 4 x 10 (in/yd) (Generic) Every Other Day/30 Days Discharge Instructions: Secure with tape as directed. Secured With: [employer]ist Dressing, Size 5, 12x25 (in/yd) (Generic) Every Other Day/30 Days Discharge Instructions: Apply over the kerlix to secure in place. 09/26/2023: The entire posterior arm has healed. The dorsal surface of her hand has just a small linear opening. The sutures remain intact and the elbow and she is here to have those removed today. No debridement was necessary today. I removed the sutures from her elbow. We will continue Xeroform and Kerlix on the elbow and dorsal hand wounds. I expect she will be completely healed at her visit next week. Electronic Signature(s) Signed: 09/26/2023 10:41:46 AM By: Marolyn Nest MD FACS Entered By: Marolyn Nest on 09/26/2023 10:41:46 Orman, JOAN SAUNDERS ([id number]) [id number] Page 6 of  6 -------------------------------------------------------------------------------- SuperBill Details Patient Name: Date of Service: DALIYA, PARCHMENT 09/26/2023 Medical Record Number: [id number] Patient Account Number: [id number] Date of Birth/Sex: Treating RN: 28-Dec-1940 (83 y.o. JEANELL Claven Pollen Primary Care Provider: Leonel Cole Other Clinician: Referring Provider: Treating Provider/Extender: Marolyn Nest Leonel Cole Devra in Treatment: 2 Diagnosis Coding ICD-10 Codes Code Description [phone] Non-pressure chronic ulcer of skin of other sites with unspecified severity Z48.02 Encounter for removal of sutures E11.622 Type 2 diabetes mellitus with other skin ulcer I50.32 Chronic diastolic (congestive) heart failure Facility Procedures : CPT4 Code: [id number] Description: 99214 - WOUND CARE VISIT-LEV 4 EST PT Modifier: Quantity: 1 Physician Procedures : CPT4 Code Description Modifier [id number] - WC PHYS LEVEL 3 - EST PT ICD-10 Diagnosis Description 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

## 2023-10-03 ENCOUNTER — Encounter (HOSPITAL_BASED_OUTPATIENT_CLINIC_OR_DEPARTMENT_OTHER): Payer: HMO | Admitting: General Surgery

## 2023-10-03 DIAGNOSIS — E11622 Type 2 diabetes mellitus with other skin ulcer: Secondary | ICD-10-CM | POA: Diagnosis not present

## 2023-10-03 NOTE — Progress Notes (Signed)
Leslie, SAFFRAN [name] ([id number]) [id number] Page 1 of 7 Visit Report for 10/03/2023 Chief Complaint Document Details Patient Name: Date of Service: [name], [name] 10/03/2023 9:30 A M Medical Record Number: [id number] Patient Account Number: [id number] Date of Birth/Sex: Treating RN: December 09, 1940 (83 y.o. F) Primary Care Provider: Irven Coe Other Clinician: Referring Provider: Treating Provider/Extender: Wynelle Bourgeois in Treatment: 3 Information Obtained from: Patient Chief Complaint Patient seen for complaints of Non-Healing Wound. Electronic Signature(s) Signed: 10/03/2023 10:10:38 AM By: Duanne Guess MD FACS Entered By: Duanne Guess on 10/03/2023 10:10:38 -------------------------------------------------------------------------------- HPI Details Patient Name: Date of Service: [name] 10/03/2023 9:30 A M Medical Record Number: [id number] Patient Account Number: [id number] Date of Birth/Sex: Treating RN: [birth year] (83 y.o. F) Primary Care Provider: Irven Coe Other Clinician: Referring Provider: Treating Provider/Extender: Wynelle Bourgeois in Treatment: 3 History of Present Illness HPI Description: ADMISSION 09/12/2023 This is an 83 year old woman with a history of poorly controlled type 2 diabetes (last available hemoglobin A1c 10.4%, about 1 year ago). She suffered a fall that resulted in a large skin tear on her arm and dorsal hand. She was referred to the wound care center for further evaluation and management of the wound. 09/19/2023: The majority of the arm has healed with just 2 open areas near the elbow. The sutures in the elbow are intact. The dorsum of her hand has eschar over it, the wound is quite a bit smaller with just some slough on the surface. 09/26/2023: The entire posterior arm has healed. The dorsal surface of her hand has just a small linear opening. The sutures remain intact and the elbow  and she is here to have those removed today. 10/03/2023: All of her wounds are healed. Electronic Signature(s) Signed: 10/03/2023 10:12:00 AM By: Duanne Guess MD FACS Entered By: Duanne Guess on 10/03/2023 10:12:00 Physical Exam Details -------------------------------------------------------------------------------- [name] ([id number]) [phone].pdf Page 2 of 7 Patient Name: Date of Service: Leslie, [name] 10/03/2023 9:30 A M Medical Record Number: [id number] Patient Account Number: [id number] Date of Birth/Sex: Treating RN: 1941/04/23 (83 y.o. F) Primary Care Provider: Irven Coe Other Clinician: Referring Provider: Treating Provider/Extender: Wynelle Bourgeois in Treatment: 3 Constitutional Hypertensive, asymptomatic. . . . no acute distress. Respiratory Normal work of breathing on room air.. Notes 10/03/2023: All of her wounds are healed. Electronic Signature(s) Signed: 10/03/2023 10:13:58 AM By: Duanne Guess MD FACS Entered By: Duanne Guess on 10/03/2023 10:13:58 -------------------------------------------------------------------------------- Physician Orders Details Patient Name: Date of Service: [name] 10/03/2023 9:30 A M Medical Record Number: [id number] Patient Account Number: [id number] Date of Birth/Sex: Treating RN: [birth year] (83 y.o. [name] Primary Care Provider: Irven Coe Other Clinician: Referring Provider: Treating Provider/Extender: Wynelle Bourgeois in Treatment: 3 The following information was scribed by: Zenaida Deed The information was scribed for: Duanne Guess Verbal / Phone Orders: No Diagnosis Coding ICD-10 Coding Code Description L98.499 Non-pressure chronic ulcer of skin of other sites with unspecified severity E11.622 Type 2 diabetes mellitus with other skin ulcer I50.32 Chronic diastolic (congestive) heart failure Discharge From [facility]  Services Discharge from Wound Care Center Bathing/ Shower/ Hygiene May shower and wash wound with soap and water. Non Wound Condition pply the following to affected area as directed: - moisturizing lotion to scars daily A Electronic Signature(s) Signed: 10/03/2023 10:14:14 AM By: Duanne Guess MD FACS Entered By: Duanne Guess on 10/03/2023 10:14:14 -------------------------------------------------------------------------------- Problem List Details Patient Name: Date of Service: [name] 10/03/2023  9:30 A Leslie, [name] ([id number]) [phone].pdf Page 3 of 7 Medical Record Number: [id number] Patient Account Number: [id number] Date of Birth/Sex: Treating RN: 1941-04-13 (83 y.o. 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 09/12/2023 No Yes E11.622 Type 2 diabetes mellitus with other skin ulcer 09/12/2023 No Yes I50.32 Chronic diastolic (congestive) heart failure 09/12/2023 No Yes Inactive Problems Resolved Problems ICD-10 Code Description Active Date Resolved Date Z48.02 Encounter for removal of sutures 09/26/2023 09/26/2023 Electronic Signature(s) Signed: 10/03/2023 10:10:27 AM By: Duanne Guess MD FACS Entered By: Duanne Guess on 10/03/2023 10:10:27 -------------------------------------------------------------------------------- Progress Note/History and Physical Details Patient Name: Date of Service: [name] 10/03/2023 9:30 A M Medical Record Number: [id number] Patient Account Number: [id number] Date of Birth/Sex: Treating RN: 1941-01-06 (83 y.o. F) Primary Care Provider: Irven Coe Other Clinician: Referring Provider: Treating  Provider/Extender: Wynelle Bourgeois in Treatment: 3 Subjective Chief Complaint Information obtained from Patient Patient seen for complaints of Non-Healing Wound. History of Present Illness (HPI) ADMISSION 09/12/2023 This is an 83 year old woman with a history of poorly controlled type 2 diabetes (last available hemoglobin A1c 10.4%, about 1 year ago). She suffered a fall that resulted in a large skin tear on her arm and dorsal hand. She was referred to the wound care center for further evaluation and management of the wound. 09/19/2023: The majority of the arm has healed with just 2 open areas near the elbow. The sutures in the elbow are intact. The dorsum of her hand has eschar over it, the wound is quite a bit smaller with just some slough on the surface. 09/26/2023: The entire posterior arm has healed. The dorsal surface of her hand has just a small linear opening. The sutures remain intact and the elbow and she is here to have those removed today. 10/03/2023: All of her wounds are healed. GRASYN, MARCIAL [name] ([id number]) [id number] Page 4 of 7 Patient History Information obtained from Patient, Chart. Family History Cancer - Siblings, Diabetes - Siblings, Heart Disease - Siblings,Father,Mother, No family history of Hereditary Spherocytosis, Hypertension, Kidney Disease, Lung Disease, Seizures, Stroke, Thyroid Problems, Tuberculosis. Social History Never smoker, Marital Status - Married, Alcohol Use - Never, Drug Use - No History, Caffeine Use - Never. Medical History Respiratory Patient has history of Asthma - 2024 Cardiovascular Patient has history of Congestive Heart Failure - 2023, Hypertension - 2023 Endocrine Patient has history of Type II Diabetes - 2024- Jardiance Patient is treated with Controlled Diet. Blood sugar is not tested. Hospitalization/Surgery History - [name] 2020. - EGD 2015. - dehydration 2024. - SOB,  bronchitis 2022. - Angina 2020. - CVA 2016. - Pancreatitis 2015. - right mastectomy. Medical A Surgical History Notes nd Cardiovascular CVA 2016 Oncologic HO adneocarcinoma for the right breast- right mastectomy Objective Constitutional Hypertensive, asymptomatic. no acute distress. Vitals Time Taken: 9:46 AM, Height: 65 in, Weight: 148 lbs, BMI: 24.6, Temperature: 98 F, Pulse: 84 bpm, Respiratory Rate: 18 breaths/min, Blood Pressure: 184/80 mmHg. Respiratory Normal work of breathing on room air.. General Notes: 10/03/2023: All of her wounds are healed. Integumentary (Hair, Skin) Wound #1 status is Healed - Epithelialized. Original cause of wound was Trauma. The date acquired was: 09/10/2023. The wound has been in treatment 3 weeks. The wound is located on the Left  Hand - Dorsum. The wound measures 0cm length x 0cm width x 0cm depth; 0cm^2 area and 0cm^3 volume. There is Fat Layer (Subcutaneous Tissue) exposed. There is no tunneling or undermining noted. There is a none present amount of drainage noted. There is no granulation within the wound bed. There is no necrotic tissue within the wound bed. The periwound skin appearance had no abnormalities noted for color. The periwound skin appearance did not exhibit: Callus, Crepitus, Excoriation, Induration, Rash, Scarring, Dry/Scaly, Maceration. Periwound temperature was noted as No Abnormality. Wound #2 status is Healed - Epithelialized. Original cause of wound was Trauma. The date acquired was: 09/10/2023. The wound has been in treatment 3 weeks. The wound is located on the Left Forearm. The wound measures 0cm length x 0cm width x 0cm depth; 0cm^2 area and 0cm^3 volume. There is no tunneling or undermining noted. There is a small amount of sanguinous drainage noted. There is no granulation within the wound bed. There is no necrotic tissue within the wound bed. The periwound skin appearance had no abnormalities noted for color. The periwound  skin appearance did not exhibit: Callus, Crepitus, Excoriation, Induration, Rash, Scarring, Dry/Scaly, Maceration. Periwound temperature was noted as No Abnormality. The periwound has tenderness on palpation. Assessment Active Problems ICD-10 Non-pressure chronic ulcer of skin of other sites with unspecified severity Type 2 diabetes mellitus with other skin ulcer Chronic diastolic (congestive) heart failure Plan Desaulniers, Keighley [name] ([id number]) [phone].pdf Page 5 of 7 Discharge From [facility] Services: Discharge from Wound Care Center Bathing/ Shower/ Hygiene: May shower and wash wound with soap and water. Non Wound Condition: Apply the following to affected area as directed: - moisturizing lotion to scars daily 10/03/2023: All of her wounds are healed. I recommended that she keep the freshly healed area well moisturized to maintain suppleness and avoid tightening of the skin secondary to scars. We will discharge her from the wound care center. She may follow-up in the future, should the need arise. Electronic Signature(s) Signed: 10/03/2023 10:14:52 AM By: Duanne Guess MD FACS Entered By: Duanne Guess on 10/03/2023 10:14:51 -------------------------------------------------------------------------------- HxROS Details Patient Name: Date of Service: [name] 10/03/2023 9:30 A M Medical Record Number: [id number] Patient Account Number: [id number] Date of Birth/Sex: Treating RN: Dec 02, 1940 (83 y.o. F) Primary Care Provider: Irven Coe Other Clinician: Referring Provider: Treating Provider/Extender: Wynelle Bourgeois in Treatment: 3 Label Progress Note Print Version as History and Physical for this encounter Information Obtained From Patient Chart Respiratory Medical History: Positive for: Asthma - 2024 Cardiovascular Medical History: Positive for: Congestive Heart Failure - 2023; Hypertension - 2023 Past Medical History Notes: CVA  2016 Endocrine Medical History: Positive for: Type II Diabetes - 2024- Jardiance Time with diabetes: within a year Treated with: Diet Blood sugar tested every day: No Oncologic Medical History: Past Medical History Notes: HO adneocarcinoma for the right breast- right mastectomy Immunizations Pneumococcal Vaccine: Received Pneumococcal Vaccination: Yes Received Pneumococcal Vaccination On or After 60th Birthday: Yes Implantable Devices None Hospitalization / Surgery History Type of Hospitalization/Surgery [name] 2020 EGD 2015 [place], Arabell [name] ([id number]) [phone].pdf Page 6 of 7 dehydration 2024 SOB, bronchitis 2022 Angina 2020 CVA 2016 Pancreatitis 2015 right mastectomy Family and Social History Cancer: Yes - Siblings; Diabetes: Yes - Siblings; Heart Disease: Yes - Siblings,Father,Mother; Hereditary Spherocytosis: No; Hypertension: No; Kidney Disease: No; Lung Disease: No; Seizures: No; Stroke: No; Thyroid Problems: No; Tuberculosis: No; Never smoker; Marital Status - Married; Alcohol Use: Never; Drug Use: No History; Caffeine  Use: Never Social Determinants of Health (SDOH) 1. In the past 2 months, did you or others you live with eat smaller meals or skip meals because you didn't have money for foodo : No 2. Are you homeless or worried that you might be in the futureo : No 3. Do you have trouble paying for your utilities (gas, electricity, phone)o : No 4. Do you have trouble finding or paying for a rideo : No 5. Do you need daycare, or better daycare, for your kidso : No 6. Are you unemployed or without regular incomeo : No 7. Do you need help finding a better jobo : No 8. Do you need help getting more educationo : No 9. Are you concerned about someone in your home using drugs or alcoholo : No 10. Do you feel unsafe in your daily lifeo : No 11. Is anyone in your home threatening or abusing youo : No 12. Do you lack quality relationships  that make you feel valued and supportedo : No 13. Do you need help getting cultural information in a language you understando : No 14. Do you need help getting internet accesso : No Advanced Directives and Instructions Spiritual or Cultural beliefs preclude asking about Advance Care Planning: No Advanced Directives: Yes Copy Provided: No Do not resuscitate: No Living Will: Yes Copy Provided: No Medical Power of Attorney: Yes Copy Provided: No Surrogate Decision Maker: No Electronic Signature(s) Signed: 10/03/2023 10:21:15 AM By: Duanne Guess MD FACS Entered By: Duanne Guess on 10/03/2023 10:13:08 -------------------------------------------------------------------------------- SuperBill Details Patient Name: Date of Service: [name] 10/03/2023 Medical Record Number: [id number] Patient Account Number: [id number] Date of Birth/Sex: Treating RN: [birth year] (83 y.o. [name] Primary Care Provider: Irven Coe Other Clinician: Referring Provider: Treating Provider/Extender: Wynelle Bourgeois in Treatment: 3 Diagnosis Coding ICD-10 Codes Code Description [phone] Non-pressure chronic ulcer of skin of other sites with unspecified severity E11.622 Type 2 diabetes mellitus with other skin ulcer I50.32 Chronic diastolic (congestive) heart failure Facility Procedures : CPT4 Code: [id number] Description: 99213 - WOUND CARE VISIT-LEV 3 EST PT Modifier: Quantity: 1 Physician Procedures : CPT4 Code Description IRLENE, SODERQUIST [name] ([id number]) [id number] - WC PHYS LEVEL 2 - EST PT 1 ICD-10 Diagnosis Description L98.499 Non-pressure chronic ulcer of skin of other sites with unspecified severity  E11.622 Type 2 diabetes mellitus with other skin ulcer I50.32 Chronic diastolic (congestive) heart failure Quantity: 227.pdf Page 7 of 7 Electronic Signature(s) Signed: 10/03/2023 10:15:06 AM By: Duanne Guess MD  FACS Entered By: Duanne Guess on 10/03/2023 10:15:06

## 2023-10-03 NOTE — Progress Notes (Signed)
Leslie, [name] [name] [id number] Page 1 of 9 Visit Report for 10/03/2023 Arrival Information Details Patient Name: Date of Service: [name], [name] 10/03/2023 9:30 A M Medical Record Number: [id number] Patient Account Number: [id number] Date of Birth/Sex: Treating RN: 1941/08/06 (83 y.o. F) Primary Care [place] Liz: Irven Coe Other Clinician: Referring Krikor Willet: Treating Jillienne Egner/Extender: Wynelle Bourgeois in Treatment: 3 Visit Information History Since Last Visit Added or deleted any medications: No Patient Arrived: Ambulatory Any new allergies or adverse reactions: No Arrival Time: 09:46 Had a fall or experienced change in No Accompanied By: husband activities of daily living that may affect Transfer Assistance: None risk of falls: Patient Identification Verified: Yes Signs or symptoms of abuse/neglect since last visito No Secondary Verification Process Completed: Yes Hospitalized since last visit: No Patient Requires Transmission-Based Precautions: No Implantable device outside of the clinic excluding No Patient Has Alerts: Yes cellular tissue based products placed in the center Patient Alerts: Patient on Blood Thinner since last visit: Plavix Has Dressing in Place as Prescribed: Yes Pain Present Now: No Electronic Signature(s) Signed: 10/03/2023 4:35:06 PM By: Zenaida Deed RN, BSN Entered By: Zenaida Deed on 10/03/2023 10:00:29 -------------------------------------------------------------------------------- Clinic Level of Care Assessment Details Patient Name: Date of Service: Leslie, [name] 10/03/2023 9:30 A M Medical Record Number: [id number] Patient Account Number: [id number] Date of Birth/Sex: Treating RN: 03-Aug-1941 (83 y.o. [name] Primary Care Dorenda Pfannenstiel: Irven Coe Other Clinician: Referring Milynn Quirion: Treating Milda Lindvall/Extender: Wynelle Bourgeois in Treatment: 3 Clinic Level of Care  Assessment Items TOOL 4 Quantity Score []  - 0 Use when only an EandM is performed on FOLLOW-UP visit ASSESSMENTS - Nursing Assessment / Reassessment X- 1 10 Reassessment of Co-morbidities (includes updates in patient status) X- 1 5 Reassessment of Adherence to Treatment Plan ASSESSMENTS - Wound and Skin A ssessment / Reassessment X - Simple Wound Assessment / Reassessment - one wound 1 5 []  - 0 Complex Wound Assessment / Reassessment - multiple wounds []  - 0 Dermatologic / Skin Assessment (not related to wound area) ASSESSMENTS - Focused Assessment []  - 0 Circumferential Edema Measurements - multi extremities []  - 0 Nutritional Assessment / Counseling / Intervention [name], [name] [name] ([id number]) [phone].pdf Page 2 of 9 []  - 0 Lower Extremity Assessment (monofilament, tuning fork, pulses) []  - 0 Peripheral Arterial Disease Assessment (using hand held doppler) ASSESSMENTS - Ostomy and/or Continence Assessment and Care []  - 0 Incontinence Assessment and Management []  - 0 Ostomy Care Assessment and Management (repouching, etc.) PROCESS - Coordination of Care X - Simple Patient / Family Education for ongoing care 1 15 []  - 0 Complex (extensive) Patient / Family Education for ongoing care X- 1 10 Staff obtains Chiropractor, Records, T Results / Process Orders est []  - 0 Staff telephones HHA, Nursing Homes / Clarify orders / etc []  - 0 Routine Transfer to another Facility (non-emergent condition) []  - 0 Routine Hospital Admission (non-emergent condition) []  - 0 New Admissions / Manufacturing engineer / Ordering NPWT Apligraf, etc. , []  - 0 Emergency Hospital Admission (emergent condition) X- 1 10 Simple Discharge Coordination []  - 0 Complex (extensive) Discharge Coordination PROCESS - Special Needs []  - 0 Pediatric / Minor Patient Management []  - 0 Isolation Patient Management []  - 0 Hearing / Language / Visual special needs []  - 0 Assessment  of Community assistance (transportation, D/C planning, etc.) []  - 0 Additional assistance / Altered mentation []  - 0 Support Surface(s) Assessment (bed, cushion, seat, etc.) INTERVENTIONS - Wound Cleansing / Measurement  X - Simple Wound Cleansing - one wound 1 5 []  - 0 Complex Wound Cleansing - multiple wounds X- 1 5 Wound Imaging (photographs - any number of wounds) []  - 0 Wound Tracing (instead of photographs) []  - 0 Simple Wound Measurement - one wound []  - 0 Complex Wound Measurement - multiple wounds INTERVENTIONS - Wound Dressings X - Small Wound Dressing one or multiple wounds 1 10 []  - 0 Medium Wound Dressing one or multiple wounds []  - 0 Large Wound Dressing one or multiple wounds []  - 0 Application of Medications - topical []  - 0 Application of Medications - injection INTERVENTIONS - Miscellaneous []  - 0 External ear exam []  - 0 Specimen Collection (cultures, biopsies, blood, body fluids, etc.) []  - 0 Specimen(s) / Culture(s) sent or taken to Lab for analysis []  - 0 Patient Transfer (multiple staff / Nurse, adult / Similar devices) []  - 0 Simple Staple / Suture removal (25 or less) []  - 0 Complex Staple / Suture removal (26 or more) []  - 0 Hypo / Hyperglycemic Management (close monitor of Blood Glucose) [name], [name] ([id number]) [id number] Page 3 of 9 []  - 0 Ankle / Brachial Index (ABI) - do not check if billed separately X- 1 5 Vital Signs Has the patient been seen at the hospital within the last three years: Yes Total Score: 80 Level Of Care: New/Established - Level 3 Electronic Signature(s) Signed: 10/03/2023 4:35:06 PM By: Zenaida Deed RN, BSN Entered By: Zenaida Deed on 10/03/2023 10:11:38 -------------------------------------------------------------------------------- Encounter Discharge Information Details Patient Name: Date of Service: [name] 10/03/2023 9:30 A M Medical Record Number: [id number] Patient  Account Number: [id number] Date of Birth/Sex: Treating RN: [birth year] (83 y.o. [name] Primary Care Brittyn Salaz: Irven Coe Other Clinician: Referring Yonas Bunda: Treating Leisel Pinette/Extender: Wynelle Bourgeois in Treatment: 3 Encounter Discharge Information Items Discharge Condition: Stable Ambulatory Status: Ambulatory Discharge Destination: Home Transportation: Private Auto Accompanied By: husband Schedule Follow-up Appointment: Yes Clinical Summary of Care: Patient Declined Electronic Signature(s) Signed: 10/03/2023 4:35:06 PM By: Zenaida Deed RN, BSN Entered By: Zenaida Deed on 10/03/2023 10:12:49 -------------------------------------------------------------------------------- Lower Extremity Assessment Details Patient Name: Date of Service: [name], [name] 10/03/2023 9:30 A M Medical Record Number: [id number] Patient Account Number: [id number] Date of Birth/Sex: Treating RN: [birth year] (83 y.o. [name] Primary Care Jo-Ann Johanning: Irven Coe Other Clinician: Referring Alexsis Kathman: Treating Leor Whyte/Extender: Wynelle Bourgeois in Treatment: 3 Electronic Signature(s) Signed: 10/03/2023 4:35:06 PM By: Zenaida Deed RN, BSN Entered By: Zenaida Deed on 10/03/2023 10:01:13 -------------------------------------------------------------------------------- Multi Wound Chart Details Patient Name: Date of Service: [name] 10/03/2023 9:30 A M Medical Record Number: [id number] Patient Account Number: [id number] [name], [name] ([id number]) [phone].pdf Page 4 of 9 Date of Birth/Sex: Treating RN: 08-05-1941 (83 y.o. F) Primary Care Caylynn Minchew: Other Clinician: Irven Coe Referring Kamariyah Timberlake: Treating Lilton Pare/Extender: Wynelle Bourgeois in Treatment: 3 Vital Signs Height(in): 65 Pulse(bpm): 84 Weight(lbs): 148 Blood Pressure(mmHg): 184/80 Body Mass Index(BMI): 24.6 Temperature(F):  98 Respiratory Rate(breaths/min): 18 [1:Photos:] [N/A:N/A] Left Hand - Dorsum Left Forearm N/A Wound Location: Trauma Trauma N/A Wounding Event: Skin T ear Abrasion N/A Primary Etiology: Asthma, Congestive Heart Failure, Asthma, Congestive Heart Failure, N/A Comorbid History: Hypertension, Type II Diabetes Hypertension, Type II Diabetes 09/10/2023 09/10/2023 N/A Date Acquired: 3 3 N/A Weeks of Treatment: Healed - Epithelialized Healed - Epithelialized N/A Wound Status: No No N/A Wound Recurrence: 0x0x0 0x0x0 N/A Measurements L x W x D (cm) 0 0 N/A A (cm) :  rea 0 0 N/A Volume (cm) : 100.00% 100.00% N/A % Reduction in Area: 100.00% 100.00% N/A % Reduction in Volume: Full Thickness Without Exposed Full Thickness Without Exposed N/A Classification: Support Structures Support Structures None Present Small N/A Exudate Amount: N/A Sanguinous N/A Exudate Type: N/A red N/A Exudate Color: None Present (0%) None Present (0%) N/A Granulation Amount: None Present (0%) None Present (0%) N/A Necrotic Amount: Fat Layer (Subcutaneous Tissue): Yes Fascia: No N/A Exposed Structures: Fascia: No Fat Layer (Subcutaneous Tissue): No Tendon: No Tendon: No Muscle: No Muscle: No Joint: No Joint: No Bone: No Bone: No Large (67-100%) Large (67-100%) N/A Epithelialization: Excoriation: No Excoriation: No N/A Periwound Skin Texture: Induration: No Induration: No Callus: No Callus: No Crepitus: No Crepitus: No Rash: No Rash: No Scarring: No Scarring: No Maceration: No Maceration: No N/A Periwound Skin Moisture: Dry/Scaly: No Dry/Scaly: No Atrophie Blanche: No Ecchymosis: Yes N/A Periwound Skin Color: Cyanosis: No Atrophie Blanche: No Ecchymosis: No Cyanosis: No Erythema: No Erythema: No Hemosiderin Staining: No Hemosiderin Staining: No Mottled: No Mottled: No Pallor: No Pallor: No Rubor: No Rubor: No No Abnormality No Abnormality N/A Temperature: N/A  Yes N/A Tenderness on Palpation: Treatment Notes Electronic Signature(s) Signed: 10/03/2023 10:10:33 AM By: Duanne Guess MD FACS Entered By: Duanne Guess on 10/03/2023 10:10:33 [name], [name] ([id number]) [id number] Page 5 of 9 -------------------------------------------------------------------------------- Multi-Disciplinary Care Plan Details Patient Name: Date of Service: [name], [name] 10/03/2023 9:30 A M Medical Record Number: [id number] Patient Account Number: [id number] Date of Birth/Sex: Treating RN: [birth year] (83 y.o. [name] Primary Care Abdishakur Gottschall: Irven Coe Other Clinician: Referring Ebrima Ranta: Treating Lorina Duffner/Extender: Wynelle Bourgeois in Treatment: 3 Multidisciplinary Care Plan reviewed with physician Active Inactive Electronic Signature(s) Signed: 10/03/2023 4:35:06 PM By: Zenaida Deed RN, BSN Entered By: Zenaida Deed on 10/03/2023 10:03:53 -------------------------------------------------------------------------------- Pain Assessment Details Patient Name: Date of Service: [name] 10/03/2023 9:30 A M Medical Record Number: [id number] Patient Account Number: [id number] Date of Birth/Sex: Treating RN: 08-20-1941 (83 y.o. F) Primary Care Stephen Baruch: Irven Coe Other Clinician: Referring Boe Deans: Treating Neala Miggins/Extender: Wynelle Bourgeois in Treatment: 3 Active Problems Location of Pain Severity and Description of Pain Patient Has Paino No Site Locations Rate the pain. Current Pain Level: 0 Pain Management and Medication Current Pain Management: Electronic Signature(s) Signed: 10/03/2023 4:35:06 PM By: Zenaida Deed RN, BSN Entered By: Zenaida Deed on 10/03/2023 10:01:04 Stayer, [name] ([id number]) [phone].pdf Page 6 of 9 -------------------------------------------------------------------------------- Patient/Caregiver Education  Details Patient Name: Date of Service: [name], [name] 1/21/2025andnbsp9:30 A M Medical Record Number: [id number] Patient Account Number: [id number] Date of Birth/Gender: Treating RN: Feb 08, 1941 (83 y.o. [name] Primary Care Physician: Irven Coe Other Clinician: Referring Physician: Treating Physician/Extender: Wynelle Bourgeois in Treatment: 3 Education Assessment Education Provided To: Patient Education Topics Provided Wound/Skin Impairment: Methods: Explain/Verbal Responses: Reinforcements needed, State content correctly Electronic Signature(s) Signed: 10/03/2023 4:35:06 PM By: Zenaida Deed RN, BSN Entered By: Zenaida Deed on 10/03/2023 10:04:10 -------------------------------------------------------------------------------- Wound Assessment Details Patient Name: Date of Service: [name] 10/03/2023 9:30 A M Medical Record Number: [id number] Patient Account Number: [id number] Date of Birth/Sex: Treating RN: 1941/06/09 (83 y.o. [name] Primary Care Keywon Mestre: Irven Coe Other Clinician: Referring Micala Saltsman: Treating Harmoni Lucus/Extender: Wynelle Bourgeois in Treatment: 3 Wound Status Wound Number: 1 Primary Skin Tear Etiology: Wound Location: Left Hand - Dorsum Wound Status: Healed - Epithelialized Wounding Event: Trauma Comorbid Asthma, Congestive Heart Failure, Hypertension, Type II Date Acquired: 09/10/2023 History:  Diabetes Weeks Of Treatment: 3 Clustered Wound: No Photos Wound Measurements Length: (cm) Width: (cm) [name], [name] ([id number]) Depth: (cm) Area: (cm) Volume: (cm) 0 % Reduction in Area: 100% 0 % Reduction in Volume: 100% [phone].pdf Page 7 of 9 0 Epithelialization: Large (67-100%) 0 Tunneling: No 0 Undermining: No Wound Description Classification: Full Thickness Without Exposed Support Structures Exudate Amount: None Present Foul Odor After Cleansing:  No Slough/Fibrino No Wound Bed Granulation Amount: None Present (0%) Exposed Structure Necrotic Amount: None Present (0%) Fascia Exposed: No Fat Layer (Subcutaneous Tissue) Exposed: Yes Tendon Exposed: No Muscle Exposed: No Joint Exposed: No Bone Exposed: No Periwound Skin Texture Texture Color No Abnormalities Noted: No No Abnormalities Noted: Yes Callus: No Temperature / Pain Crepitus: No Temperature: No Abnormality Excoriation: No Induration: No Rash: No Scarring: No Moisture No Abnormalities Noted: No Dry / Scaly: No Maceration: No Electronic Signature(s) Signed: 10/03/2023 4:35:06 PM By: Zenaida Deed RN, BSN Entered By: Zenaida Deed on 10/03/2023 10:02:15 -------------------------------------------------------------------------------- Wound Assessment Details Patient Name: Date of Service: [name] 10/03/2023 9:30 A M Medical Record Number: [id number] Patient Account Number: [id number] Date of Birth/Sex: Treating RN: 08/06/1941 (83 y.o. [name] Primary Care Kailand Seda: Irven Coe Other Clinician: Referring Jeanne Diefendorf: Treating Yetzali Weld/Extender: Wynelle Bourgeois in Treatment: 3 Wound Status Wound Number: 2 Primary Abrasion Etiology: Wound Location: Left Forearm Wound Status: Healed - Epithelialized Wounding Event: Trauma Comorbid Asthma, Congestive Heart Failure, Hypertension, Type II Date Acquired: 09/10/2023 History: Diabetes Weeks Of Treatment: 3 Clustered Wound: No Photos [name], [name] ([id number]) [phone].pdf Page 8 of 9 Wound Measurements Length: (cm) Width: (cm) Depth: (cm) Area: (cm) Volume: (cm) 0 % Reduction in Area: 100% 0 % Reduction in Volume: 100% 0 Epithelialization: Large (67-100%) 0 Tunneling: No 0 Undermining: No Wound Description Classification: Full Thickness Without Exposed Suppor Exudate Amount: Small Exudate Type: Sanguinous Exudate Color: red t Structures Foul  Odor After Cleansing: No Slough/Fibrino No Wound Bed Granulation Amount: None Present (0%) Exposed Structure Necrotic Amount: None Present (0%) Fascia Exposed: No Fat Layer (Subcutaneous Tissue) Exposed: No Tendon Exposed: No Muscle Exposed: No Joint Exposed: No Bone Exposed: No Periwound Skin Texture Texture Color No Abnormalities Noted: No No Abnormalities Noted: Yes Callus: No Temperature / Pain Crepitus: No Temperature: No Abnormality Excoriation: No Tenderness on Palpation: Yes Induration: No Rash: No Scarring: No Moisture No Abnormalities Noted: No Dry / Scaly: No Maceration: No Electronic Signature(s) Signed: 10/03/2023 4:35:06 PM By: Zenaida Deed RN, BSN Entered By: Zenaida Deed on 10/03/2023 10:06:47 -------------------------------------------------------------------------------- Vitals Details Patient Name: Date of Service: [name] 10/03/2023 9:30 A M Medical Record Number: [id number] Patient Account Number: [id number] Date of Birth/Sex: Treating RN: 06-08-1941 (83 y.o. F) Primary Care Darian Cansler: Irven Coe Other Clinician: Referring Natania Finigan: Treating Jerico Grisso/Extender: Wynelle Bourgeois in Treatment: 3 Vital Signs Time Taken: 09:46 Temperature (F): 98 [name], [name] ([id number]) [phone].pdf Page 9 of 9 Height (in): 65 Pulse (bpm): 84 Weight (lbs): 148 Respiratory Rate (breaths/min): 18 Body Mass Index (BMI): 24.6 Blood Pressure (mmHg): 184/80 Reference Range: 80 - 120 mg / dl Electronic Signature(s) Signed: 10/03/2023 4:35:06 PM By: Zenaida Deed RN, BSN Entered By: Zenaida Deed on 10/03/2023 10:00:58

## 2023-10-06 ENCOUNTER — Encounter: Payer: Self-pay | Admitting: Family Medicine

## 2023-10-06 ENCOUNTER — Ambulatory Visit
Admission: RE | Admit: 2023-10-06 | Discharge: 2023-10-06 | Disposition: A | Discharge status: Home / Self Care | Payer: HMO | Source: Ambulatory Visit | Attending: Family Medicine | Admitting: Family Medicine

## 2023-10-06 ENCOUNTER — Other Ambulatory Visit: Payer: Self-pay | Admitting: Family Medicine

## 2023-10-06 DIAGNOSIS — M25552 Pain in left hip: Secondary | ICD-10-CM

## 2023-10-11 DIAGNOSIS — I671 Cerebral aneurysm, nonruptured: Secondary | ICD-10-CM | POA: Diagnosis not present

## 2023-10-12 ENCOUNTER — Ambulatory Visit (HOSPITAL_COMMUNITY)
Admission: RE | Admit: 2023-10-12 | Discharge: 2023-10-12 | Disposition: A | Discharge status: Home / Self Care | Payer: HMO | Source: Ambulatory Visit | Attending: Family Medicine | Admitting: Family Medicine

## 2023-10-12 ENCOUNTER — Other Ambulatory Visit: Payer: Self-pay | Admitting: Family Medicine

## 2023-10-12 ENCOUNTER — Other Ambulatory Visit (HOSPITAL_COMMUNITY): Payer: Self-pay | Admitting: Family Medicine

## 2023-10-12 DIAGNOSIS — T148XXA Other injury of unspecified body region, initial encounter: Secondary | ICD-10-CM | POA: Diagnosis not present

## 2023-10-12 DIAGNOSIS — Z1231 Encounter for screening mammogram for malignant neoplasm of breast: Secondary | ICD-10-CM

## 2023-10-12 DIAGNOSIS — X58XXXA Exposure to other specified factors, initial encounter: Secondary | ICD-10-CM | POA: Diagnosis not present

## 2023-10-12 DIAGNOSIS — S7012XA Contusion of left thigh, initial encounter: Secondary | ICD-10-CM | POA: Insufficient documentation

## 2023-10-12 NOTE — Procedures (Signed)
Interventional Radiology Procedure Note   Hx:  Patient had recent fall with development of left thigh bruise/fluid.   She reports improving over time, even since her dx performed 09/15/23.    Procedure:   US performed.  Improving fluid.  Discussed with patient.  She denies any current symptoms.  She wishes to defer today.  No aspirate performed.   Complications: None  Recommendations:  - Continue current care.  - Consider ACE wrap/compression during the days.  - If worsens or does not resolve over time, we are happy to see her back and reconsider aspiration.   Signed,  Yvone Neu. Loreta Ave, DO, ABVM, RPVI

## 2023-10-16 ENCOUNTER — Ambulatory Visit (HOSPITAL_COMMUNITY): Payer: HMO

## 2023-10-17 ENCOUNTER — Ambulatory Visit (HOSPITAL_COMMUNITY): Payer: HMO

## 2023-10-23 ENCOUNTER — Ambulatory Visit (HOSPITAL_COMMUNITY): Payer: HMO

## 2023-10-31 ENCOUNTER — Ambulatory Visit: Payer: HMO

## 2023-11-09 ENCOUNTER — Ambulatory Visit: Payer: HMO

## 2023-11-15 ENCOUNTER — Ambulatory Visit: Payer: HMO

## 2023-11-23 ENCOUNTER — Ambulatory Visit
Admission: RE | Admit: 2023-11-23 | Discharge: 2023-11-23 | Disposition: A | Discharge status: Home / Self Care | Source: Ambulatory Visit | Attending: Family Medicine | Admitting: Family Medicine

## 2023-11-23 DIAGNOSIS — Z1231 Encounter for screening mammogram for malignant neoplasm of breast: Secondary | ICD-10-CM | POA: Diagnosis not present

## 2023-11-28 NOTE — Therapy (Signed)
 OUTPATIENT PHYSICAL THERAPY LOWER EXTREMITY EVALUATION   Patient Name: [name] MRN: [id number] DOB:[birth year], 83 y.o., female Today's Date: 11/29/2023  END OF SESSION:  PT End of Session - 11/29/23 1213     Visit Number 1    Number of Visits 7    Date for PT Re-Evaluation 01/10/24    Authorization Type [employer]    PT Start Time 1212    PT Stop Time 1250    PT Time Calculation (min) 38 min             Past Medical History:  Diagnosis Date   Aortic atherosclerosis (HCC)    Asthma    Breast CA (HCC)    Cancer (HCC)    right breast mastectomy   CVA (cerebral vascular accident) (HCC) 04/27/2015   DDD (degenerative disc disease), cervical    Duodenal ulcer    External hemorrhoids    Generalized anxiety disorder    H/O chest pain 03/24/2006   normal nuclear  study   Headache    High cholesterol    Hypertension    Mild aortic insufficiency 03/28/2006   echo   Normal cardiac stress test 2005   Pancreatitis    Personal history of chemotherapy    Personal history of radiation therapy    PONV (postoperative nausea and vomiting)    Sinus bradycardia on ECG    Stroke [facility])    Past Surgical History:  Procedure Laterality Date   ABDOMINAL HYSTERECTOMY     APPENDECTOMY     "30 years ago"   childbirth     x 2   CHOLECYSTECTOMY     "15 years ago"   ESOPHAGOGASTRODUODENOSCOPY N/A 03/04/2014   Procedure: ESOPHAGOGASTRODUODENOSCOPY (EGD);  Surgeon: Theda Belfast, MD;  Location: [facility] ENDOSCOPY;  Service: Endoscopy;  Laterality: N/A;   MASTECTOMY  1993   right breast   RIGHT/LEFT HEART CATH AND CORONARY ANGIOGRAPHY N/A 05/30/2019   Procedure: RIGHT/LEFT HEART CATH AND CORONARY ANGIOGRAPHY;  Surgeon: Corky Crafts, MD;  Location: [facility] INVASIVE CV LAB;  Service: Cardiovascular;  Laterality: N/A;   TUBAL LIGATION     Patient Active Problem List   Diagnosis Date Noted   Acute respiratory failure with hypoxia (HCC) 10/11/2022   RSV (respiratory  syncytial virus pneumonia) 10/03/2022   Fracture of proximal phalanx of lesser toe of right foot 10/02/2022   Chronic diastolic CHF (congestive heart failure) (HCC) 10/02/2022   Lactic acidosis 10/02/2022   Mixed hyperlipidemia due to type 2 diabetes mellitus (HCC) 10/02/2022   Antibiotic reaction, initial encounter 10/02/2022   Abnormal CT of the chest 10/02/2022   Dehydration with hyponatremia 10/01/2022   Type 2 diabetes mellitus with hyperglycemia (HCC) 10/01/2022   Bronchitis 05/25/2021   Asthma exacerbation 05/25/2021   Angina pectoris (HCC)    Arthritis of carpometacarpal (CMC) joint of left thumb 10/03/2017   De Quervain's tenosynovitis, left 10/03/2017   History of stroke 06/13/2017   DOE (dyspnea on exertion) 05/24/2017   Occipital neuralgia of right side 11/02/2016   Palpitations 06/17/2015   Cerebrovascular accident (CVA) due to occlusion of left middle cerebral artery (HCC) 06/17/2015   Hyperlipidemia 06/17/2015   HLD (hyperlipidemia)    Intracranial vascular stenosis    Cerebral infarction due to occlusion of left middle cerebral artery (HCC)    Occipital headache    Extrinsic asthma 07/02/2014   Migraine variant with status migrainosus 07/02/2014   Benign paroxysmal positional vertigo 07/02/2014   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/2014   Essential  hypertension 03/03/2014   Mild aortic insufficiency    H/O chest pain    Sinus bradycardia on ECG    GERD without esophagitis 03/24/2008   ADENOCARCINOMA, BREAST, RIGHT 06/30/2007   ASTHMA 06/30/2007   Upper airway cough syndrome 06/30/2007    PCP: Irven Coe, MD  REFERRING PROVIDER: Hurman Horn, MD  REFERRING DIAG: [phone] (ICD-10-CM) - Strain of muscle, fascia and tendon of the posterior muscle group at thigh level, left thigh, initial encounter   Rationale for Evaluation and Treatment: Rehabilitation  THERAPY DIAG:  Other abnormalities of gait and mobility  Muscle weakness  (generalized)  PERTINENT HISTORY: Vertebrobasilar artery syndrome,CVA (L MCA), HTN, bronchitis, DM2, BPPV  WEIGHT BEARING RESTRICTIONS: No  FALLS:  Has patient fallen in last 6 months? Yes. Number of falls 2, last was December 29th  LIVING ENVIRONMENT: Lives with: lives with their family Lives in: House/apartment Stairs: No Has following equipment at home: None  OCCUPATION: retired   PRECAUTIONS: None ---------------------------------------------------------------------------------------------  SUBJECTIVE:   SUBJECTIVE STATEMENT: Eval statement 11/29/2023: had a fall in late December going down stairs, had cracked, cracked pelvis. Feeling better but continuing to have pinpoint at the bottom glute/top   RED FLAGS: None   PLOF: Independent  PATIENT GOALS: get the hip fixed so I dont hurt all the time  NEXT MD VISIT: "maybe 4 weeks or something" ---------------------------------------------------------------------------------------------  OBJECTIVE:  Note: Objective measures were completed at Evaluation unless otherwise noted.  DIAGNOSTIC FINDINGS:  IMPRESSION: 1. No acute findings. 2. Mild degenerative change of the left hip.  PATIENT SURVEYS:  LEFS 60/80  COGNITION: Overall cognitive status: Within functional limits for tasks assessed     SENSATION: WFL  EDEMA:  None noted   POSTURE: No Significant postural limitations  PALPATION: TTP   LOWER EXTREMITY ROM:  Active ROM Right eval Left eval  Hip flexion [facility]  Hip extension [facility]  Hip abduction [facility]  Hip adduction    Hip internal rotation    Hip external rotation    Knee flexion [facility]  Knee extension [facility]  Ankle dorsiflexion    Ankle plantarflexion    Ankle inversion    Ankle eversion     (Blank rows = not tested)  ! Indicates pain with testing  LOWER EXTREMITY MMT:  MMT Right eval Left eval  Hip flexion 3+ 4-  Hip extension 4 4  Hip abduction 4- 4-  Hip adduction     Hip internal rotation    Hip external rotation    Knee flexion 4- 4-  Knee extension 4- 4  Ankle dorsiflexion    Ankle plantarflexion    Ankle inversion    Ankle eversion     (Blank rows = not tested)  ! Indicates pain with testing  FUNCTIONAL TESTS:  Timed up and go (TUG): 16s  GAIT: Distance walked: 126ft Assistive device utilized: None Level of assistance: Complete Independence Comments: slow cadence, decreased step length, rigid posture  [facility] Adult PT Treatment:                                                DATE: 11/29/2023  Manual Therapy: Piriformis release Self Care: Pt education, detailed below POC discussion    PATIENT EDUCATION:  Education details: Pt received education regarding HEP performance, ADL performance, functional activity tolerance, impairment education, appropriate performance of therapeutic activities. Person educated: Patient Education method: Explanation, Demonstration, Tactile cues, Verbal cues, and Handouts Education comprehension: verbalized understanding and returned demonstration  HOME EXERCISE PROGRAM: Access Code: URL: [email or web address] Date: 11/29/2023 Prepared by: Sheliah Plane  Exercises - Piriformis Mobilization with Small Ball  - 1 x daily - 7 x weekly - 1 sets - 1-3 reps - 4m hold - Supine Piriformis Stretch with Foot on Ground  - 1 x daily - 7 x weekly - 2 sets - 1 reps - 20m hold - Supine Figure 4 Piriformis Stretch  - 1 x daily - 7 x weekly - 2 sets - 1 reps - 66m hold - Supine Bridge with Resistance Band  - 1 x daily - 7 x weekly - 2-3 sets - 8-12 reps - 4s hold ---------------------------------------------------------------------------------------------  ASSESSMENT:  CLINICAL IMPRESSION: Eval impression (11/28/2023): Pt. attended today's physical therapy session for  evaluation of L hip pain. Pt has complaints of a bone sticking out and hurting while she's sitting as well as loss of balance causing her to fall rev. Pt has notable deficits with balance confidence, global hip strength, piriformis motility, and ambulation quality/speed. Pt would benefit from therapeutic focus on gait and balance training, ensuring piriformis motility, and global hip strength.  Treatment performed today focused on pt education detailed in obj. Pt demonstrated great understanding of education provided. required minimal verbal/tactile cues and no physical assistance for appropriate performance with today's activities. Pt requires the intervention of skilled outpatient physical therapy to address the aforementioned deficits and progress towards a functional level in line with therapeutic goals.   OBJECTIVE IMPAIRMENTS: Abnormal gait, decreased balance, decreased mobility, increased fascial restrictions, improper body mechanics, and pain.   ACTIVITY LIMITATIONS: lifting, sitting, standing, squatting, stairs, and locomotion level  PARTICIPATION LIMITATIONS: interpersonal relationship, shopping, and community activity  PERSONAL FACTORS: Age and Past/current experiences are also affecting patient's functional outcome.   REHAB POTENTIAL: Good  CLINICAL DECISION MAKING: Stable/uncomplicated  EVALUATION COMPLEXITY: Low   GOALS: Goals reviewed with patient? YES  SHORT TERM GOALS: Target date: 12/20/2023 Pt will be independent with administered HEP to demonstrate the competency necessary for long term managemnet of symptoms at home.  Baseline: Goal status: INITIAL    LONG TERM GOALS: Target date: 01/10/2024    Pt. Will achieve a LEFS score of 70/80  as to demonstrate improvement in self-perceived functional ability with daily activities.  Baseline: 60/80 Goal status: INITIAL  2.  Pt will improve Global Hip/knee strength to a 4+/5 to demonstrate improvement in strength for  quality of motion and activity performance.  Baseline: see obj chart Goal status: INITIAL  3.  Pt will report pain levels improving during ADLs to be less than or equal to 2/10 as to demonstrate improved tolerance with daily functional activities such as sitting, walking, and standing.  Baseline: 7/10 Goal status: INITIAL  4. Pt will improve TUG time with no AD or assistance to at least 13s to demonstrate improvement in  gait speed,quality and reduce fall risk during community/household ambulation.  --------------------------------------------------------------------------------------------- PLAN:  PT FREQUENCY: 1-2x/week  PT DURATION: 6 weeks  PLANNED INTERVENTIONS: 97110-Therapeutic exercises, 97530- Therapeutic activity, 97112- Neuromuscular re-education, 97535- Self Care, 08657- Manual therapy, [phone]- Gait training, [phone]- Traction (mechanical), Patient/Family education, Balance training, Stair training, Taping, Dry Needling, and Joint mobilization  PLAN FOR NEXT SESSION: Review HEP, Begin POC as detailed in the assessment   Sheliah Plane, PT, DPT 11/29/2023, 1:02 PM

## 2023-11-29 ENCOUNTER — Other Ambulatory Visit: Payer: Self-pay

## 2023-11-29 ENCOUNTER — Ambulatory Visit: Attending: Sports Medicine | Admitting: Physical Therapy

## 2023-11-29 ENCOUNTER — Encounter: Payer: Self-pay | Admitting: Physical Therapy

## 2023-11-29 DIAGNOSIS — R2689 Other abnormalities of gait and mobility: Secondary | ICD-10-CM | POA: Diagnosis not present

## 2023-11-29 DIAGNOSIS — M6281 Muscle weakness (generalized): Secondary | ICD-10-CM | POA: Insufficient documentation

## 2023-12-07 ENCOUNTER — Ambulatory Visit: Admitting: Physical Therapy

## 2023-12-08 ENCOUNTER — Ambulatory Visit (HOSPITAL_COMMUNITY)

## 2023-12-11 ENCOUNTER — Encounter: Payer: Self-pay | Admitting: Physical Therapy

## 2023-12-11 ENCOUNTER — Ambulatory Visit: Admitting: Physical Therapy

## 2023-12-11 DIAGNOSIS — E1122 Type 2 diabetes mellitus with diabetic chronic kidney disease: Secondary | ICD-10-CM | POA: Diagnosis not present

## 2023-12-11 DIAGNOSIS — R2689 Other abnormalities of gait and mobility: Secondary | ICD-10-CM

## 2023-12-11 DIAGNOSIS — M6281 Muscle weakness (generalized): Secondary | ICD-10-CM

## 2023-12-11 DIAGNOSIS — I10 Essential (primary) hypertension: Secondary | ICD-10-CM | POA: Diagnosis not present

## 2023-12-11 NOTE — Therapy (Addendum)
 OUTPATIENT PHYSICAL THERAPY TREATMENT NOTE  PHYSICAL THERAPY DISCHARGE SUMMARY  Visits from Start of Care: 2  Current functional level related to goals / functional outcomes: See most recent assessment   Remaining deficits: See most recent assessment   Education / Equipment: See most recent assessment   Patient agrees to discharge. Patient goals were  unknown, pt has not returned since last visit . Patient is being discharged due to not returning since the last visit.  Albesa Huguenin, PT, DPT 01/10/2024, 10:13 AM   Patient Name: [name] MRN: [id number] DOB:October 20, 1940, 83 y.o., female Today's Date: 12/11/2023  END OF SESSION:  PT End of Session - 12/11/23 1256     Visit Number 2    Number of Visits 7    Date for PT Re-Evaluation 01/10/24    Authorization Type [employer]    PT Start Time 1310    PT Stop Time 1350    PT Time Calculation (min) 40 min    Activity Tolerance Patient tolerated treatment well    Behavior During Therapy WFL for tasks assessed/performed              Past Medical History:  Diagnosis Date   Aortic atherosclerosis (HCC)    Asthma    Breast CA (HCC)    Cancer (HCC)    right breast mastectomy   CVA (cerebral vascular accident) (HCC) 04/27/2015   DDD (degenerative disc disease), cervical    Duodenal ulcer    External hemorrhoids    Generalized anxiety disorder    H/O chest pain 03/24/2006   normal nuclear  study   Headache    High cholesterol    Hypertension    Mild aortic insufficiency 03/28/2006   echo   Normal cardiac stress test 2005   Pancreatitis    Personal history of chemotherapy    Personal history of radiation therapy    PONV (postoperative nausea and vomiting)    Sinus bradycardia on ECG    Stroke [facility])    Past Surgical History:  Procedure Laterality Date   ABDOMINAL HYSTERECTOMY     APPENDECTOMY     "30 years ago"   childbirth     x 2   CHOLECYSTECTOMY     "15 years ago"    ESOPHAGOGASTRODUODENOSCOPY N/A 03/04/2014   Procedure: ESOPHAGOGASTRODUODENOSCOPY (EGD);  Surgeon: Almeda Aris, MD;  Location: [facility] ENDOSCOPY;  Service: Endoscopy;  Laterality: N/A;   MASTECTOMY  1993   right breast   RIGHT/LEFT HEART CATH AND CORONARY ANGIOGRAPHY N/A 05/30/2019   Procedure: RIGHT/LEFT HEART CATH AND CORONARY ANGIOGRAPHY;  Surgeon: Lucendia Rusk, MD;  Location: [facility] INVASIVE CV LAB;  Service: Cardiovascular;  Laterality: N/A;   TUBAL LIGATION     Patient Active Problem List   Diagnosis Date Noted   Acute respiratory failure with hypoxia (HCC) 10/11/2022   RSV (respiratory syncytial virus pneumonia) 10/03/2022   Fracture of proximal phalanx of lesser toe of right foot 10/02/2022   Chronic diastolic CHF (congestive heart failure) (HCC) 10/02/2022   Lactic acidosis 10/02/2022   Mixed hyperlipidemia due to type 2 diabetes mellitus (HCC) 10/02/2022   Antibiotic reaction, initial encounter 10/02/2022   Abnormal CT of the chest 10/02/2022   Dehydration with hyponatremia 10/01/2022   Type 2 diabetes mellitus with hyperglycemia (HCC) 10/01/2022   Bronchitis 05/25/2021   Asthma exacerbation 05/25/2021   Angina pectoris (HCC)    Arthritis of carpometacarpal (CMC) joint of left thumb 10/03/2017   De Quervain's tenosynovitis, left 10/03/2017  History of stroke 06/13/2017   DOE (dyspnea on exertion) 05/24/2017   Occipital neuralgia of right side 11/02/2016   Palpitations 06/17/2015   Cerebrovascular accident (CVA) due to occlusion of left middle cerebral artery (HCC) 06/17/2015   Hyperlipidemia 06/17/2015   HLD (hyperlipidemia)    Intracranial vascular stenosis    Cerebral infarction due to occlusion of left middle cerebral artery (HCC)    Occipital headache    Extrinsic asthma 07/02/2014   Migraine variant with status migrainosus 07/02/2014   Benign paroxysmal positional vertigo 07/02/2014   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/2014   Essential hypertension  03/03/2014   Mild aortic insufficiency    H/O chest pain    Sinus bradycardia on ECG    GERD without esophagitis 03/24/2008   ADENOCARCINOMA, BREAST, RIGHT 06/30/2007   ASTHMA 06/30/2007   Upper airway cough syndrome 06/30/2007    PCP: Benedetto Brady, MD  REFERRING PROVIDER: Rance Burrows, MD  REFERRING DIAG: [phone] (ICD-10-CM) - Strain of muscle, fascia and tendon of the posterior muscle group at thigh level, left thigh, initial encounter   Rationale for Evaluation and Treatment: Rehabilitation  THERAPY DIAG:  Other abnormalities of gait and mobility  Muscle weakness (generalized)  PERTINENT HISTORY: Vertebrobasilar artery syndrome,CVA (L MCA), HTN, bronchitis, DM2, BPPV  WEIGHT BEARING RESTRICTIONS: No  FALLS:  Has patient fallen in last 6 months? Yes. Number of falls 2, last was December 29th  LIVING ENVIRONMENT: Lives with: lives with their family Lives in: House/apartment Stairs: No Has following equipment at home: None  OCCUPATION: retired   PRECAUTIONS: None ---------------------------------------------------------------------------------------------  SUBJECTIVE:   SUBJECTIVE STATEMENT: Pt stated that she felt great after last session, however after a day or 2 the pain came back. Reports compliance with HEP  Eval statement 11/29/2023: had a fall in late December going down stairs, had cracked, cracked pelvis. Feeling better but continuing to have pinpoint at the bottom glute/top   RED FLAGS: None   PLOF: Independent  PATIENT GOALS: get the hip fixed so I dont hurt all the time  NEXT MD VISIT: "maybe 4 weeks or something" ---------------------------------------------------------------------------------------------  OBJECTIVE:  Note: Objective measures were completed at Evaluation unless otherwise noted.  DIAGNOSTIC FINDINGS:  IMPRESSION: 1. No acute findings. 2. Mild degenerative change of the left hip.  PATIENT SURVEYS:  LEFS  60/80  COGNITION: Overall cognitive status: Within functional limits for tasks assessed     SENSATION: WFL  EDEMA:  None noted   POSTURE: No Significant postural limitations  PALPATION: TTP   LOWER EXTREMITY ROM:  Active ROM Right eval Left eval  Hip flexion [facility]  Hip extension [facility]  Hip abduction [facility]  Hip adduction    Hip internal rotation    Hip external rotation    Knee flexion [facility]  Knee extension [facility]  Ankle dorsiflexion    Ankle plantarflexion    Ankle inversion    Ankle eversion     (Blank rows = not tested)  ! Indicates pain with testing  LOWER EXTREMITY MMT:  MMT Right eval Left eval  Hip flexion 3+ 4-  Hip extension 4 4  Hip abduction 4- 4-  Hip adduction    Hip internal rotation    Hip external rotation    Knee flexion 4- 4-  Knee extension 4- 4  Ankle dorsiflexion    Ankle plantarflexion    Ankle inversion    Ankle eversion     (Blank rows = not tested)  ! Indicates pain with testing  FUNCTIONAL TESTS:  Timed up and go (TUG): 16s  GAIT: Distance walked: 143ft Assistive device utilized: None Level of assistance: Complete Independence Comments: slow cadence, decreased step length, rigid posture   [facility] Adult PT Treatment:                                                DATE: 12/11/2023  Therapeutic Exercise: NuStep 5' Piriformis/gluteal release with IR/ER PROM Piriformis cross body stretch Hamstring stretch  Therapeutic Activity: Mini squat, UE assist 2x8 Monster walk fwd/bkwd 2x22ft                                                                                                                              [facility] Adult PT Treatment:                                                DATE: 11/29/2023  Manual Therapy: Piriformis release Self Care: Pt education, detailed below POC discussion    PATIENT EDUCATION:  Education details: Pt received education regarding HEP performance, ADL performance, functional  activity tolerance, impairment education, appropriate performance of therapeutic activities. Person educated: Patient Education method: Explanation, Demonstration, Tactile cues, Verbal cues, and Handouts Education comprehension: verbalized understanding and returned demonstration  HOME EXERCISE PROGRAM: Access Code: URL: [email or web address] Date: 11/29/2023 Prepared by: Albesa Huguenin  Exercises - Piriformis Mobilization with Small Ball  - 1 x daily - 7 x weekly - 1 sets - 1-3 reps - 61m hold - Supine Piriformis Stretch with Foot on Ground  - 1 x daily - 7 x weekly - 2 sets - 1 reps - 85m hold - Supine Figure 4 Piriformis Stretch  - 1 x daily - 7 x weekly - 2 sets - 1 reps - 64m hold - Supine Bridge with Resistance Band  - 1 x daily - 7 x weekly - 2-3 sets - 8-12 reps - 4s hold ---------------------------------------------------------------------------------------------  ASSESSMENT:  CLINICAL IMPRESSION: Pt attended physical therapy session for continuation of treatment regarding L hip pain. Today's treatment focused on improvement of  L hip mobility, surrounding musculature motility, and global hip strength/stability . Pt showed great tolerance to treatment and demonstrated improvement with L hip motility between sessions. Some difficulties continue with continuity of pain relief between sessions. Pt required maximal verbal/tactile cuing alongside minimal physical assistance for safe and appropriate performance of today's standing activities. Continue with therapeutic focus on L hip stability/strength, gluteal/piriformis motility, and balance as tolerated.   Eval impression (11/28/2023): Pt. attended today's physical therapy session for evaluation of L hip pain. Pt has complaints of a bone sticking out and hurting while she's sitting as well as loss of balance causing her to  fall repeatedly. Pt has notable deficits with balance confidence, global hip strength, piriformis  motility, and ambulation quality/speed. Pt would benefit from therapeutic focus on gait and balance training, ensuring piriformis motility, and global hip strength.  Treatment performed today focused on pt education detailed in obj. Pt demonstrated great understanding of education provided. required minimal verbal/tactile cues and no physical assistance for appropriate performance with today's activities. Pt requires the intervention of skilled outpatient physical therapy to address the aforementioned deficits and progress towards a functional level in line with therapeutic goals.   OBJECTIVE IMPAIRMENTS: Abnormal gait, decreased balance, decreased mobility, increased fascial restrictions, improper body mechanics, and pain.   ACTIVITY LIMITATIONS: lifting, sitting, standing, squatting, stairs, and locomotion level  PARTICIPATION LIMITATIONS: interpersonal relationship, shopping, and community activity  PERSONAL FACTORS: Age and Past/current experiences are also affecting patient's functional outcome.   REHAB POTENTIAL: Good  CLINICAL DECISION MAKING: Stable/uncomplicated  EVALUATION COMPLEXITY: Low   GOALS: Goals reviewed with patient? YES  SHORT TERM GOALS: Target date: 12/20/2023 Pt will be independent with administered HEP to demonstrate the competency necessary for long term managemnet of symptoms at home.  Baseline: Goal status: INITIAL    LONG TERM GOALS: Target date: 01/10/2024    Pt. Will achieve a LEFS score of 70/80  as to demonstrate improvement in self-perceived functional ability with daily activities.  Baseline: 60/80 Goal status: INITIAL  2.  Pt will improve Global Hip/knee strength to a 4+/5 to demonstrate improvement in strength for quality of motion and activity performance.  Baseline: see obj chart Goal status: INITIAL  3.  Pt will report pain levels improving during ADLs to be less than or equal to 2/10 as to demonstrate improved tolerance with daily  functional activities such as sitting, walking, and standing.  Baseline: 7/10 Goal status: INITIAL  4. Pt will improve TUG time with no AD or assistance to at least 13s to demonstrate improvement in gait speed,quality and reduce fall risk during community/household ambulation.  --------------------------------------------------------------------------------------------- PLAN:  PT FREQUENCY: 1-2x/week  PT DURATION: 6 weeks  PLANNED INTERVENTIONS: 97110-Therapeutic exercises, 97530- Therapeutic activity, [id number]- Neuromuscular re-education, 97535- Self Care, [id number]- Manual therapy, [phone]- Gait training, [phone]- Traction (mechanical), Patient/Family education, Balance training, Stair training, Taping, Dry Needling, and Joint mobilization  PLAN FOR NEXT SESSION: Continue with therapeutic focus on L hip stability/strength, gluteal/piriformis motility, and balance as tolerated.  Albesa Huguenin, PT, DPT 12/11/2023, 1:53 PM

## 2023-12-14 DIAGNOSIS — E1122 Type 2 diabetes mellitus with diabetic chronic kidney disease: Secondary | ICD-10-CM | POA: Diagnosis not present

## 2023-12-14 DIAGNOSIS — I10 Essential (primary) hypertension: Secondary | ICD-10-CM | POA: Diagnosis not present

## 2023-12-14 DIAGNOSIS — M25552 Pain in left hip: Secondary | ICD-10-CM | POA: Diagnosis not present

## 2023-12-14 DIAGNOSIS — K219 Gastro-esophageal reflux disease without esophagitis: Secondary | ICD-10-CM | POA: Diagnosis not present

## 2023-12-14 DIAGNOSIS — I5030 Unspecified diastolic (congestive) heart failure: Secondary | ICD-10-CM | POA: Diagnosis not present

## 2023-12-14 DIAGNOSIS — I725 Aneurysm of other precerebral arteries: Secondary | ICD-10-CM | POA: Diagnosis not present

## 2023-12-14 DIAGNOSIS — I7774 Dissection of vertebral artery: Secondary | ICD-10-CM | POA: Diagnosis not present

## 2023-12-14 DIAGNOSIS — I700 Atherosclerosis of aorta: Secondary | ICD-10-CM | POA: Diagnosis not present

## 2023-12-14 DIAGNOSIS — F411 Generalized anxiety disorder: Secondary | ICD-10-CM | POA: Diagnosis not present

## 2023-12-14 DIAGNOSIS — J439 Emphysema, unspecified: Secondary | ICD-10-CM | POA: Diagnosis not present

## 2023-12-14 DIAGNOSIS — J45909 Unspecified asthma, uncomplicated: Secondary | ICD-10-CM | POA: Diagnosis not present

## 2023-12-15 DIAGNOSIS — M545 Low back pain, unspecified: Secondary | ICD-10-CM | POA: Diagnosis not present

## 2023-12-18 ENCOUNTER — Ambulatory Visit: Admitting: Physical Therapy

## 2023-12-21 DIAGNOSIS — M545 Low back pain, unspecified: Secondary | ICD-10-CM | POA: Diagnosis not present

## 2023-12-27 DIAGNOSIS — M7062 Trochanteric bursitis, left hip: Secondary | ICD-10-CM | POA: Diagnosis not present

## 2023-12-27 DIAGNOSIS — M25552 Pain in left hip: Secondary | ICD-10-CM | POA: Diagnosis not present

## 2023-12-28 DIAGNOSIS — H04123 Dry eye syndrome of bilateral lacrimal glands: Secondary | ICD-10-CM | POA: Diagnosis not present

## 2023-12-28 DIAGNOSIS — H26492 Other secondary cataract, left eye: Secondary | ICD-10-CM | POA: Diagnosis not present

## 2023-12-28 DIAGNOSIS — H524 Presbyopia: Secondary | ICD-10-CM | POA: Diagnosis not present

## 2024-01-08 DIAGNOSIS — M1612 Unilateral primary osteoarthritis, left hip: Secondary | ICD-10-CM | POA: Diagnosis not present

## 2024-01-15 DIAGNOSIS — M1812 Unilateral primary osteoarthritis of first carpometacarpal joint, left hand: Secondary | ICD-10-CM | POA: Diagnosis not present

## 2024-01-16 DIAGNOSIS — R300 Dysuria: Secondary | ICD-10-CM | POA: Diagnosis not present

## 2024-01-29 DIAGNOSIS — H26492 Other secondary cataract, left eye: Secondary | ICD-10-CM | POA: Diagnosis not present

## 2024-02-07 DIAGNOSIS — M1612 Unilateral primary osteoarthritis, left hip: Secondary | ICD-10-CM | POA: Diagnosis not present

## 2024-02-08 ENCOUNTER — Ambulatory Visit
Admission: EM | Admit: 2024-02-08 | Discharge: 2024-02-08 | Disposition: A | Discharge status: Home / Self Care | Attending: Nurse Practitioner | Admitting: Nurse Practitioner

## 2024-02-08 ENCOUNTER — Encounter: Payer: Self-pay | Admitting: Emergency Medicine

## 2024-02-08 DIAGNOSIS — J45901 Unspecified asthma with (acute) exacerbation: Secondary | ICD-10-CM | POA: Diagnosis not present

## 2024-02-08 DIAGNOSIS — J069 Acute upper respiratory infection, unspecified: Secondary | ICD-10-CM

## 2024-02-08 LAB — POC SARS CORONAVIRUS 2 AG -  ED: SARS Coronavirus 2 Ag: NEGATIVE

## 2024-02-08 MED ORDER — PREDNISONE 20 MG PO TABS: 20.0000 mg | ORAL_TABLET | Freq: Every day | ORAL | 0 refills | Status: DC

## 2024-02-08 MED ORDER — ALBUTEROL SULFATE HFA 108 (90 BASE) MCG/ACT IN AERS: 2.0000 | INHALATION_SPRAY | Freq: Four times a day (QID) | RESPIRATORY_TRACT | 0 refills | Status: AC | PRN

## 2024-02-08 MED ORDER — IPRATROPIUM-ALBUTEROL 0.5-2.5 (3) MG/3ML IN SOLN
3.0000 mL | Freq: Once | RESPIRATORY_TRACT | Status: AC
  Administered 2024-02-08: 3 mL via RESPIRATORY_TRACT

## 2024-02-08 MED ORDER — GUAIFENESIN 100 MG/5ML PO LIQD: 10.0000 mL | Freq: Four times a day (QID) | ORAL | 0 refills | Status: DC | PRN

## 2024-02-08 NOTE — Discharge Instructions (Addendum)
 The COVID test was negative. You were given a breathing treatment today in the office. Take medication as prescribed. Increase fluids allow for plenty of rest. You may take over-the-counter Tylenol  as needed for pain, fever, or general discomfort. Recommend use of a humidifier in your bedroom at nighttime during sleep and sleeping elevated on pillows while symptoms persist. Go to the emergency department immediately if you experience shortness of breath, difficulty breathing, or chest pain. As discussed, if symptoms fail to improve, you may follow-up in this clinic or with your primary care physician for further evaluation. Follow-up as needed.

## 2024-02-08 NOTE — ED Provider Notes (Signed)
 [facility]    CSN: [id number] Arrival date & time: 02/08/24  1024      History   Chief Complaint No chief complaint on file.   HPI [name] is a 83 y.o. female.   The history is provided by the patient.   Patient presents with a 2 to 3-day history of cough, wheezing, and bodyaches.  Patient denies fever, chills, headache, ear pain, chest pain, abdominal pain, nausea, vomiting, diarrhea, or rash.  Patient denies any obvious known sick contacts.  Patient reports prior history of asthma.  States she is not taking any medication for her symptoms. Past Medical History:  Diagnosis Date   Aortic atherosclerosis (HCC)    Asthma    Breast CA (HCC)    Cancer (HCC)    right breast mastectomy   CVA (cerebral vascular accident) (HCC) 04/27/2015   DDD (degenerative disc disease), cervical    Duodenal ulcer    External hemorrhoids    Generalized anxiety disorder    H/O chest pain 03/24/2006   normal nuclear  study   Headache    High cholesterol    Hypertension    Mild aortic insufficiency 03/28/2006   echo   Normal cardiac stress test 2005   Pancreatitis    Personal history of chemotherapy    Personal history of radiation therapy    PONV (postoperative nausea and vomiting)    Sinus bradycardia on ECG    Stroke [facility])     Patient Active Problem List   Diagnosis Date Noted   Acute respiratory failure with hypoxia (HCC) 10/11/2022   RSV (respiratory syncytial virus pneumonia) 10/03/2022   Fracture of proximal phalanx of lesser toe of right foot 10/02/2022   Chronic diastolic CHF (congestive heart failure) (HCC) 10/02/2022   Lactic acidosis 10/02/2022   Mixed hyperlipidemia due to type 2 diabetes mellitus (HCC) 10/02/2022   Antibiotic reaction, initial encounter 10/02/2022   Abnormal CT of the chest 10/02/2022   Dehydration with hyponatremia 10/01/2022   Type 2 diabetes mellitus with hyperglycemia (HCC) 10/01/2022   Bronchitis 05/25/2021   Asthma  exacerbation 05/25/2021   Angina pectoris (HCC)    Arthritis of carpometacarpal (CMC) joint of left thumb 10/03/2017   De Quervain's tenosynovitis, left 10/03/2017   History of stroke 06/13/2017   DOE (dyspnea on exertion) 05/24/2017   Occipital neuralgia of right side 11/02/2016   Palpitations 06/17/2015   Cerebrovascular accident (CVA) due to occlusion of left middle cerebral artery (HCC) 06/17/2015   Hyperlipidemia 06/17/2015   HLD (hyperlipidemia)    Intracranial vascular stenosis    Cerebral infarction due to occlusion of left middle cerebral artery (HCC)    Occipital headache    Extrinsic asthma 07/02/2014   Migraine variant with status migrainosus 07/02/2014   Benign paroxysmal positional vertigo 07/02/2014   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/2014   Essential hypertension 03/03/2014   Mild aortic insufficiency    H/O chest pain    Sinus bradycardia on ECG    GERD without esophagitis 03/24/2008   ADENOCARCINOMA, BREAST, RIGHT 06/30/2007   ASTHMA 06/30/2007   Upper airway cough syndrome 06/30/2007    Past Surgical History:  Procedure Laterality Date   ABDOMINAL HYSTERECTOMY     APPENDECTOMY     "30 years ago"   childbirth     x 2   CHOLECYSTECTOMY     "15 years ago"   ESOPHAGOGASTRODUODENOSCOPY N/A 03/04/2014   Procedure: ESOPHAGOGASTRODUODENOSCOPY (EGD);  Surgeon: Almeda Aris, MD;  Location: [facility] ENDOSCOPY;  Service:  Endoscopy;  Laterality: N/A;   MASTECTOMY  1993   right breast   RIGHT/LEFT HEART CATH AND CORONARY ANGIOGRAPHY N/A 05/30/2019   Procedure: RIGHT/LEFT HEART CATH AND CORONARY ANGIOGRAPHY;  Surgeon: Lucendia Rusk, MD;  Location: [facility] INVASIVE CV LAB;  Service: Cardiovascular;  Laterality: N/A;   TUBAL LIGATION      OB History     Gravida  2   Para  2   Term      Preterm      AB      Living  2      SAB      IAB      Ectopic      Multiple      Live Births               Home Medications    Prior to Admission  medications   Medication Sig Start Date End Date Taking? Authorizing Provider  acetaminophen  (TYLENOL ) 500 MG tablet Take 500 mg by mouth every 6 (six) hours as needed. As needed for pain    [provider]  amLODipine  (NORVASC ) 5 MG tablet Take 5 mg by mouth daily.    [provider]  atorvastatin  (LIPITOR ) 80 MG tablet Take 1 tablet (80 mg total) by mouth daily at 6 PM. 07/06/15   Consuelo Denmark, MD  clopidogrel  (PLAVIX ) 75 MG tablet Take 1 tablet (75 mg total) by mouth daily. 06/01/17   Consuelo Denmark, MD  dapagliflozin  propanediol (FARXIGA ) 10 MG TABS tablet Take 1 tablet (10 mg total) by mouth daily before breakfast. 03/01/23   [name], Lela Purple, MD  DULoxetine  (CYMBALTA ) 30 MG capsule Take 30 mg by mouth daily. 05/05/21   [provider]  famotidine  (PEPCID ) 20 MG tablet One after supper 12/23/22   Diamond Formica, MD  JARDIANCE  10 MG TABS tablet Take 10 mg by mouth daily. 03/03/23   [provider]  metFORMIN  (GLUCOPHAGE ) 500 MG tablet Take 1 tablet (500 mg total) by mouth daily with breakfast. 10/05/22 12/23/22  Lesa Rape, MD  montelukast  (SINGULAIR ) 10 MG tablet Take 1 tablet (10 mg total) by mouth at bedtime. 05/29/21   Cala Castleman, MD  ondansetron  (ZOFRAN ) 4 MG tablet Take 1 tablet (4 mg total) by mouth every 8 (eight) hours as needed for nausea or vomiting. 09/03/23   Alissa April, MD  ondansetron  (ZOFRAN -ODT) 4 MG disintegrating tablet Take 1 tablet (4 mg total) by mouth every 8 (eight) hours as needed for nausea or vomiting. 09/03/23   Alissa April, MD  pantoprazole  (PROTONIX ) 40 MG tablet Take 30-60 min before first meal of the day 12/23/22   Diamond Formica, MD    Family History Family History  Problem Relation Age of Onset   Heart attack Mother    Heart disease Mother    Hypertension Mother    Asthma Mother    Heart attack Father    Heart disease Father    Hypertension Sister    Heart attack Sister    Colon cancer Sister 60   Hypertension  Sister    Heart attack Sister    Heart attack Sister    Lung disease Daughter    Thyroid  disease Daughter     Social History Social History   Tobacco Use   Smoking status: Never   Smokeless tobacco: Never  Vaping Use   Vaping status: Never Used  Substance Use Topics   Alcohol use: No    Alcohol/week: 0.0 standard drinks of  alcohol   Drug use: No     Allergies   Oxycodone -acetaminophen , Ciprofloxacin , Codeine, Doxycycline , Percocet [oxycodone -acetaminophen ], and Tramadol    Review of Systems Review of Systems Per HPI  Physical Exam Triage Vital Signs ED Triage Vitals  Encounter Vitals Group     BP 02/08/24 1051 (!) 149/77     Systolic BP Percentile --      Diastolic BP Percentile --      Pulse Rate 02/08/24 1051 74     Resp 02/08/24 1051 18     Temp 02/08/24 1051 98.6 F (37 C)     Temp Source 02/08/24 1051 Oral     SpO2 02/08/24 1051 94 %     Weight --      Height --      Head Circumference --      Peak Flow --      Pain Score 02/08/24 1052 0     Pain Loc --      Pain Education --      Exclude from Growth Chart --    No data found.  Updated Vital Signs BP (!) 149/77 (BP Location: Left Arm)   Pulse 74   Temp 98.6 F (37 C) (Oral)   Resp 18   SpO2 94%   Visual Acuity Right Eye Distance:   Left Eye Distance:   Bilateral Distance:    Right Eye Near:   Left Eye Near:    Bilateral Near:     Physical Exam Vitals and nursing note reviewed.  Constitutional:      General: She is not in acute distress.    Appearance: Normal appearance.  HENT:     Head: Normocephalic.     Right Ear: Tympanic membrane, ear canal and external ear normal.     Left Ear: Tympanic membrane, ear canal and external ear normal.     Nose: Nose normal.     Right Turbinates: Enlarged and swollen.     Left Turbinates: Enlarged and swollen.     Right Sinus: No maxillary sinus tenderness or frontal sinus tenderness.     Left Sinus: No maxillary sinus tenderness or frontal sinus  tenderness.     Mouth/Throat:     Mouth: Mucous membranes are moist.  Eyes:     Extraocular Movements: Extraocular movements intact.     Conjunctiva/sclera: Conjunctivae normal.     Pupils: Pupils are equal, round, and reactive to light.  Cardiovascular:     Rate and Rhythm: Normal rate and regular rhythm.     Pulses: Normal pulses.     Heart sounds: Normal heart sounds.  Pulmonary:     Effort: Pulmonary effort is normal.     Breath sounds: Examination of the right-upper field reveals wheezing. Examination of the left-upper field reveals wheezing. Examination of the right-lower field reveals wheezing. Examination of the left-lower field reveals wheezing. Wheezing present.     Comments: DuoNeb administered.  Post DuoNeb, patient with complete resolution of wheezing.  Patient reports "I feel much better." Abdominal:     General: Bowel sounds are normal.     Palpations: Abdomen is soft.     Tenderness: There is no abdominal tenderness.  Musculoskeletal:     Cervical back: Normal range of motion.  Lymphadenopathy:     Cervical: No cervical adenopathy.  Skin:    General: Skin is warm and dry.  Neurological:     General: No focal deficit present.     Mental Status: She is alert and oriented to  person, place, and time.  Psychiatric:        Mood and Affect: Mood normal.        Behavior: Behavior normal.      UC Treatments / Results  Labs (all labs ordered are listed, but only abnormal results are displayed) Labs Reviewed  POC SARS CORONAVIRUS 2 AG -  ED    EKG   Radiology No results found.  Procedures Procedures (including critical care time)  Medications Ordered in UC Medications  ipratropium-albuterol  (DUONEB) 0.5-2.5 (3) MG/3ML nebulizer solution 3 mL (has no administration in time range)    Initial Impression / Assessment and Plan / UC Course  I have reviewed the triage vital signs and the nursing notes.  Pertinent labs & imaging results that were available  during my care of the patient were reviewed by me and considered in my medical decision making (see chart for details).  Patient presents with a 2 to 3-day history of cough and bodyaches.  On exam, patient with notable wheezing throughout.  DuoNeb administered with good resolution of wheezing.  Symptoms consistent with a viral URI which is most likely exacerbating her underlying history of asthma.  Will treat with prednisone  20 mg as patient has underlying history of diabetes, an albuterol  inhaler, and guaifenesin  100 mg for cough.  Supportive care recommendations were provided and discussed with the patient to include fluids, rest, over-the-counter analgesics, and use of a humidifier during sleep.  Discussed indications with patient regarding follow-up.  Patient was also given strict ER follow-up precautions.  Patient was in agreement with this plan of care and verbalizes understanding.  All questions were answered.  Patient stable for discharge.  Final Clinical Impressions(s) / UC Diagnoses   Final diagnoses:  None   Discharge Instructions   None    ED Prescriptions   None    PDMP not reviewed this encounter.   Hardy Lia, NP 02/08/24 1143

## 2024-02-08 NOTE — ED Triage Notes (Signed)
 Productive cough, and body aches x 2 days

## 2024-02-09 DIAGNOSIS — J988 Other specified respiratory disorders: Secondary | ICD-10-CM | POA: Diagnosis not present

## 2024-02-13 DIAGNOSIS — M533 Sacrococcygeal disorders, not elsewhere classified: Secondary | ICD-10-CM | POA: Diagnosis not present

## 2024-02-13 DIAGNOSIS — M7062 Trochanteric bursitis, left hip: Secondary | ICD-10-CM | POA: Diagnosis not present

## 2024-02-13 DIAGNOSIS — M1612 Unilateral primary osteoarthritis, left hip: Secondary | ICD-10-CM | POA: Diagnosis not present

## 2024-02-15 ENCOUNTER — Inpatient Hospital Stay (HOSPITAL_BASED_OUTPATIENT_CLINIC_OR_DEPARTMENT_OTHER)
Admission: EM | Admit: 2024-02-15 | Discharge: 2024-02-18 | DRG: 189 | Disposition: A | Discharge status: Home / Self Care | Attending: Internal Medicine | Admitting: Internal Medicine

## 2024-02-15 ENCOUNTER — Emergency Department (HOSPITAL_BASED_OUTPATIENT_CLINIC_OR_DEPARTMENT_OTHER)

## 2024-02-15 ENCOUNTER — Other Ambulatory Visit: Payer: Self-pay

## 2024-02-15 ENCOUNTER — Encounter (HOSPITAL_BASED_OUTPATIENT_CLINIC_OR_DEPARTMENT_OTHER): Payer: Self-pay | Admitting: Emergency Medicine

## 2024-02-15 ENCOUNTER — Emergency Department (HOSPITAL_BASED_OUTPATIENT_CLINIC_OR_DEPARTMENT_OTHER): Admitting: Radiology

## 2024-02-15 DIAGNOSIS — Z853 Personal history of malignant neoplasm of breast: Secondary | ICD-10-CM | POA: Diagnosis not present

## 2024-02-15 DIAGNOSIS — Z90710 Acquired absence of both cervix and uterus: Secondary | ICD-10-CM | POA: Diagnosis not present

## 2024-02-15 DIAGNOSIS — I2510 Atherosclerotic heart disease of native coronary artery without angina pectoris: Secondary | ICD-10-CM | POA: Diagnosis not present

## 2024-02-15 DIAGNOSIS — J209 Acute bronchitis, unspecified: Secondary | ICD-10-CM | POA: Diagnosis not present

## 2024-02-15 DIAGNOSIS — Z9221 Personal history of antineoplastic chemotherapy: Secondary | ICD-10-CM

## 2024-02-15 DIAGNOSIS — I152 Hypertension secondary to endocrine disorders: Secondary | ICD-10-CM | POA: Diagnosis not present

## 2024-02-15 DIAGNOSIS — J204 Acute bronchitis due to parainfluenza virus: Principal | ICD-10-CM | POA: Diagnosis present

## 2024-02-15 DIAGNOSIS — F411 Generalized anxiety disorder: Secondary | ICD-10-CM | POA: Diagnosis not present

## 2024-02-15 DIAGNOSIS — Z8249 Family history of ischemic heart disease and other diseases of the circulatory system: Secondary | ICD-10-CM

## 2024-02-15 DIAGNOSIS — J40 Bronchitis, not specified as acute or chronic: Secondary | ICD-10-CM | POA: Diagnosis not present

## 2024-02-15 DIAGNOSIS — B348 Other viral infections of unspecified site: Secondary | ICD-10-CM | POA: Diagnosis not present

## 2024-02-15 DIAGNOSIS — E1169 Type 2 diabetes mellitus with other specified complication: Secondary | ICD-10-CM | POA: Diagnosis present

## 2024-02-15 DIAGNOSIS — E1165 Type 2 diabetes mellitus with hyperglycemia: Secondary | ICD-10-CM | POA: Diagnosis not present

## 2024-02-15 DIAGNOSIS — J9601 Acute respiratory failure with hypoxia: Principal | ICD-10-CM

## 2024-02-15 DIAGNOSIS — Z923 Personal history of irradiation: Secondary | ICD-10-CM | POA: Diagnosis not present

## 2024-02-15 DIAGNOSIS — E782 Mixed hyperlipidemia: Secondary | ICD-10-CM | POA: Diagnosis not present

## 2024-02-15 DIAGNOSIS — Z8673 Personal history of transient ischemic attack (TIA), and cerebral infarction without residual deficits: Secondary | ICD-10-CM | POA: Diagnosis not present

## 2024-02-15 DIAGNOSIS — Z9011 Acquired absence of right breast and nipple: Secondary | ICD-10-CM

## 2024-02-15 DIAGNOSIS — I5032 Chronic diastolic (congestive) heart failure: Secondary | ICD-10-CM | POA: Diagnosis present

## 2024-02-15 DIAGNOSIS — R0902 Hypoxemia: Secondary | ICD-10-CM | POA: Diagnosis not present

## 2024-02-15 DIAGNOSIS — Z7984 Long term (current) use of oral hypoglycemic drugs: Secondary | ICD-10-CM | POA: Diagnosis not present

## 2024-02-15 DIAGNOSIS — I700 Atherosclerosis of aorta: Secondary | ICD-10-CM | POA: Diagnosis not present

## 2024-02-15 DIAGNOSIS — Z7902 Long term (current) use of antithrombotics/antiplatelets: Secondary | ICD-10-CM

## 2024-02-15 DIAGNOSIS — Z79899 Other long term (current) drug therapy: Secondary | ICD-10-CM | POA: Diagnosis not present

## 2024-02-15 DIAGNOSIS — R918 Other nonspecific abnormal finding of lung field: Secondary | ICD-10-CM | POA: Diagnosis not present

## 2024-02-15 DIAGNOSIS — E1159 Type 2 diabetes mellitus with other circulatory complications: Secondary | ICD-10-CM | POA: Diagnosis not present

## 2024-02-15 DIAGNOSIS — K219 Gastro-esophageal reflux disease without esophagitis: Secondary | ICD-10-CM | POA: Diagnosis present

## 2024-02-15 DIAGNOSIS — R0602 Shortness of breath: Secondary | ICD-10-CM | POA: Diagnosis not present

## 2024-02-15 DIAGNOSIS — J45901 Unspecified asthma with (acute) exacerbation: Secondary | ICD-10-CM | POA: Diagnosis not present

## 2024-02-15 HISTORY — DX: Type 2 diabetes mellitus without complications: E11.9

## 2024-02-15 LAB — RESP PANEL BY RT-PCR (RSV, FLU A&B, COVID)  RVPGX2
Influenza A by PCR: NEGATIVE
Influenza B by PCR: NEGATIVE
Resp Syncytial Virus by PCR: NEGATIVE
SARS Coronavirus 2 by RT PCR: NEGATIVE

## 2024-02-15 LAB — BASIC METABOLIC PANEL WITH GFR
Anion gap: 14 (ref 5–15)
BUN: 16 mg/dL (ref 8–23)
CO2: 24 mmol/L (ref 22–32)
Calcium: 9.9 mg/dL (ref 8.9–10.3)
Chloride: 99 mmol/L (ref 98–111)
Creatinine, Ser: 0.87 mg/dL (ref 0.44–1.00)
GFR, Estimated: >60 mL/min (ref >60)
Glucose, Bld: 286 mg/dL — ABNORMAL HIGH (ref 70–99)
Potassium: 4.2 mmol/L (ref 3.5–5.1)
Sodium: 137 mmol/L (ref 135–145)

## 2024-02-15 LAB — CBC
HCT: 39.9 % (ref 36.0–46.0)
Hemoglobin: 13.8 g/dL (ref 12.0–15.0)
MCH: 30.5 pg (ref 26.0–34.0)
MCHC: 34.6 g/dL (ref 30.0–36.0)
MCV: 88.3 fL (ref 80.0–100.0)
Platelets: 243 10*3/uL (ref 150–400)
RBC: 4.52 MIL/uL (ref 3.87–5.11)
RDW: 13.7 % (ref 11.5–15.5)
WBC: 11.5 10*3/uL — ABNORMAL HIGH (ref 4.0–10.5)
nRBC: 0 % (ref 0.0–0.2)

## 2024-02-15 LAB — PRO BRAIN NATRIURETIC PEPTIDE: Pro Brain Natriuretic Peptide: 224 pg/mL (ref <300.0)

## 2024-02-15 LAB — HEMOGLOBIN A1C
Hgb A1c MFr Bld: 8.3 % — ABNORMAL HIGH (ref 4.8–5.6)
Mean Plasma Glucose: 191.51 mg/dL

## 2024-02-15 LAB — GLUCOSE, CAPILLARY
Glucose-Capillary: 422 mg/dL — ABNORMAL HIGH (ref 70–99)
Glucose-Capillary: 380 mg/dL — ABNORMAL HIGH (ref 70–99)

## 2024-02-15 LAB — TROPONIN T, HIGH SENSITIVITY: Troponin T High Sensitivity: <15 ng/L (ref <19)

## 2024-02-15 LAB — GROUP A STREP BY PCR: Group A Strep by PCR: NOT DETECTED

## 2024-02-15 MED ORDER — SENNOSIDES-DOCUSATE SODIUM 8.6-50 MG PO TABS: 1.0000 | ORAL_TABLET | Freq: Every evening | ORAL | Status: DC | PRN

## 2024-02-15 MED ORDER — AMOXICILLIN-POT CLAVULANATE 875-125 MG PO TABS
1.0000 | ORAL_TABLET | Freq: Once | ORAL | Status: DC
  Filled 2024-02-15: qty 1

## 2024-02-15 MED ORDER — ONDANSETRON HCL 4 MG PO TABS: 4.0000 mg | ORAL_TABLET | Freq: Four times a day (QID) | ORAL | Status: DC | PRN

## 2024-02-15 MED ORDER — IPRATROPIUM-ALBUTEROL 0.5-2.5 (3) MG/3ML IN SOLN
3.0000 mL | Freq: Four times a day (QID) | RESPIRATORY_TRACT | Status: DC | PRN
  Administered 2024-02-17: 3 mL via RESPIRATORY_TRACT
  Filled 2024-02-15: qty 9
  Filled 2024-02-15: qty 3

## 2024-02-15 MED ORDER — ONDANSETRON HCL 4 MG/2ML IJ SOLN: 4.0000 mg | Freq: Four times a day (QID) | INTRAMUSCULAR | Status: DC | PRN

## 2024-02-15 MED ORDER — ONDANSETRON HCL 4 MG/2ML IJ SOLN
4.0000 mg | Freq: Once | INTRAMUSCULAR | Status: DC
  Filled 2024-02-15: qty 2

## 2024-02-15 MED ORDER — AMLODIPINE BESYLATE 5 MG PO TABS
5.0000 mg | ORAL_TABLET | Freq: Every day | ORAL | Status: DC
  Administered 2024-02-16 – 2024-02-18 (×3): 5 mg via ORAL
  Filled 2024-02-15 (×3): qty 1

## 2024-02-15 MED ORDER — CLOPIDOGREL BISULFATE 75 MG PO TABS
75.0000 mg | ORAL_TABLET | Freq: Every day | ORAL | Status: DC
  Administered 2024-02-16 – 2024-02-18 (×3): 75 mg via ORAL
  Filled 2024-02-15 (×3): qty 1

## 2024-02-15 MED ORDER — SODIUM CHLORIDE 0.9 % IV SOLN
100.0000 mg | Freq: Once | INTRAVENOUS | Status: AC
  Administered 2024-02-15: 100 mg via INTRAVENOUS
  Filled 2024-02-15: qty 100

## 2024-02-15 MED ORDER — AZITHROMYCIN 500 MG PO TABS
500.0000 mg | ORAL_TABLET | Freq: Every day | ORAL | Status: AC
  Administered 2024-02-16: 500 mg via ORAL
  Filled 2024-02-15: qty 1

## 2024-02-15 MED ORDER — PANTOPRAZOLE SODIUM 40 MG PO TBEC
40.0000 mg | DELAYED_RELEASE_TABLET | Freq: Every day | ORAL | Status: DC
  Administered 2024-02-16 – 2024-02-18 (×3): 40 mg via ORAL
  Filled 2024-02-15 (×3): qty 1

## 2024-02-15 MED ORDER — DULOXETINE HCL 30 MG PO CPEP
30.0000 mg | ORAL_CAPSULE | Freq: Every day | ORAL | Status: DC
  Administered 2024-02-16 – 2024-02-18 (×3): 30 mg via ORAL
  Filled 2024-02-15 (×3): qty 1

## 2024-02-15 MED ORDER — SODIUM CHLORIDE 0.9 % IV SOLN
1.0000 g | Freq: Once | INTRAVENOUS | Status: AC
  Administered 2024-02-15: 1 g via INTRAVENOUS
  Filled 2024-02-15: qty 10

## 2024-02-15 MED ORDER — IOHEXOL 350 MG/ML SOLN
100.0000 mL | Freq: Once | INTRAVENOUS | Status: AC | PRN
  Administered 2024-02-15: 80 mL via INTRAVENOUS

## 2024-02-15 MED ORDER — ATORVASTATIN CALCIUM 80 MG PO TABS
80.0000 mg | ORAL_TABLET | Freq: Every day | ORAL | Status: DC
  Administered 2024-02-16 – 2024-02-17 (×2): 80 mg via ORAL
  Filled 2024-02-15 (×2): qty 1

## 2024-02-15 MED ORDER — INSULIN ASPART 100 UNIT/ML IJ SOLN
0.0000 [IU] | Freq: Every day | INTRAMUSCULAR | Status: DC
  Administered 2024-02-15 – 2024-02-16 (×2): 5 [IU] via SUBCUTANEOUS

## 2024-02-15 MED ORDER — IPRATROPIUM-ALBUTEROL 0.5-2.5 (3) MG/3ML IN SOLN
3.0000 mL | Freq: Once | RESPIRATORY_TRACT | Status: AC
  Administered 2024-02-15: 3 mL via RESPIRATORY_TRACT
  Filled 2024-02-15: qty 3

## 2024-02-15 MED ORDER — INSULIN ASPART 100 UNIT/ML IJ SOLN
0.0000 [IU] | Freq: Three times a day (TID) | INTRAMUSCULAR | Status: DC
  Administered 2024-02-15: 5 [IU] via SUBCUTANEOUS
  Administered 2024-02-16: 11 [IU] via SUBCUTANEOUS
  Administered 2024-02-16 – 2024-02-17 (×4): 15 [IU] via SUBCUTANEOUS
  Administered 2024-02-17: 8 [IU] via SUBCUTANEOUS
  Administered 2024-02-18: 3 [IU] via SUBCUTANEOUS

## 2024-02-15 MED ORDER — AZITHROMYCIN 500 MG PO TABS
250.0000 mg | ORAL_TABLET | Freq: Every day | ORAL | Status: DC
  Administered 2024-02-17 – 2024-02-18 (×2): 250 mg via ORAL
  Filled 2024-02-15 (×2): qty 1

## 2024-02-15 MED ORDER — ACETAMINOPHEN 650 MG RE SUPP: 650.0000 mg | Freq: Four times a day (QID) | RECTAL | Status: DC | PRN

## 2024-02-15 MED ORDER — ARFORMOTEROL TARTRATE 15 MCG/2ML IN NEBU
15.0000 ug | INHALATION_SOLUTION | Freq: Two times a day (BID) | RESPIRATORY_TRACT | Status: DC
  Administered 2024-02-15 – 2024-02-18 (×6): 15 ug via RESPIRATORY_TRACT
  Filled 2024-02-15 (×6): qty 2

## 2024-02-15 MED ORDER — BUDESONIDE 0.25 MG/2ML IN SUSP
0.2500 mg | Freq: Two times a day (BID) | RESPIRATORY_TRACT | Status: DC
  Administered 2024-02-15 – 2024-02-18 (×6): 0.25 mg via RESPIRATORY_TRACT
  Filled 2024-02-15 (×6): qty 2

## 2024-02-15 MED ORDER — ENOXAPARIN SODIUM 40 MG/0.4ML IJ SOSY
40.0000 mg | PREFILLED_SYRINGE | INTRAMUSCULAR | Status: DC
  Administered 2024-02-15 – 2024-02-17 (×3): 40 mg via SUBCUTANEOUS
  Filled 2024-02-15 (×3): qty 0.4

## 2024-02-15 MED ORDER — GUAIFENESIN ER 600 MG PO TB12
600.0000 mg | ORAL_TABLET | Freq: Two times a day (BID) | ORAL | Status: DC
  Administered 2024-02-15 – 2024-02-18 (×6): 600 mg via ORAL
  Filled 2024-02-15 (×6): qty 1

## 2024-02-15 MED ORDER — METHYLPREDNISOLONE SODIUM SUCC 125 MG IJ SOLR
125.0000 mg | Freq: Once | INTRAMUSCULAR | Status: AC
  Administered 2024-02-15: 125 mg via INTRAVENOUS
  Filled 2024-02-15: qty 2

## 2024-02-15 MED ORDER — MONTELUKAST SODIUM 10 MG PO TABS
10.0000 mg | ORAL_TABLET | Freq: Every day | ORAL | Status: DC
  Administered 2024-02-15 – 2024-02-17 (×3): 10 mg via ORAL
  Filled 2024-02-15 (×3): qty 1

## 2024-02-15 MED ORDER — PREDNISONE 20 MG PO TABS
40.0000 mg | ORAL_TABLET | Freq: Every day | ORAL | Status: DC
  Administered 2024-02-16 – 2024-02-18 (×3): 40 mg via ORAL
  Filled 2024-02-15 (×3): qty 2

## 2024-02-15 MED ORDER — ACETAMINOPHEN 325 MG PO TABS: 650.0000 mg | ORAL_TABLET | Freq: Four times a day (QID) | ORAL | Status: DC | PRN

## 2024-02-15 NOTE — ED Notes (Signed)
Bed assignment changed 

## 2024-02-15 NOTE — ED Notes (Signed)
 RT Note: patient had to be placed on 3lpm [place] to maintain oxygen saturation at 95-96%. Patient stated she knew her oxygen saturation was in the 80's. She said it had been 86% room air this morning before she came in

## 2024-02-15 NOTE — ED Triage Notes (Signed)
 3 weeks, cough,sorethroat, short of breath.

## 2024-02-15 NOTE — ED Notes (Signed)
 Transport to ct

## 2024-02-15 NOTE — ED Triage Notes (Signed)
 Also states very weak, says she has been able to drink and eat

## 2024-02-15 NOTE — Hospital Course (Addendum)
 [name] is a 83 y.o. female with medical history significant for chronic HFpEF, history of CVA, asthma, T2DM, HTN, HLD, breast cancer, GAD who is admitted with hypoxia due to bronchitis with asthma exacerbation. Respiratory swab tested positive for parainfluenza virus accounting for her bronchitis and asthma flare.

## 2024-02-15 NOTE — ED Notes (Signed)
 Assisted to bsc, able to void

## 2024-02-15 NOTE — Plan of Care (Signed)
 Call for Direct admit  83 y/o female with DM who needs to be admitted for bronchitis, Presented with wheezing. Pulse ox 86% in ED. She has had symptoms as outpt and has already completed 5 days of Azithromycin  and Prednisone  20 mg daily  x 7 days  In ED, CT scan suggests bronchitis. Has has been given 1 Neb, Solumedrol and Rocephin . Will receive another Neb treatment. I have requested insulin  sliding scale as well. No EKG changes or cardiac issues. Will admit to med/surg.   Yusra Ravert, MD

## 2024-02-15 NOTE — H&P (Signed)
 History and Physical    [name] QVZ:[id number] DOB: [birth year] DOA: 02/15/2024  PCP: Benedetto Brady, MD  Patient coming from: Home  I have personally briefly reviewed patient's old medical records in [facility]  Chief Complaint: Shortness of breath, cough  HPI: [name] is a 83 y.o. female with medical history significant for chronic HFpEF, history of CVA, asthma, T2DM, HTN, HLD, breast cancer, GAD who presented to the ED for evaluation of shortness of breath.  Patient reports 3 weeks of nonproductive cough, shortness of breath, chest congestion.  She has become progressively fatigued and deconditioned.  She was seen in urgent care on 5/29 and treated for URI with asthma exacerbation.  She completed a course of prednisone .  She was having persistent symptoms and was seen by her PCP who prescribed her azithromycin .  She says that she has not had any improvement in symptoms or worsening of her skin to the ED for further evaluation.  She denies fevers, chills, diaphoresis, chest pain.  [facility] ED Course  Labs/Imaging on admission: I have personally reviewed following labs and imaging studies.  Initial vitals showed BP 159/87, pulse 99, RR 21, temp 98.1 F, SpO2 94% on room air.  Patient became hypoxic to 86% while on room air and was placed on 3 L O2 via [place] with improvement to 96%.  Labs showed WBC 11.5, hemoglobin 13.8, platelets 243, sodium 137, potassium 4.2, bicarb 24, BUN 16, creatinine 0.87, serum glucose 286, proBNP 2024, troponin T <15.  SARS-CoV-2, influenza, RSV PCR negative.  Group A strep PCR negative.  CTA chest negative for PE.  Mild diffuse bronchial wall thickening with extensive mosaic attenuation throughout the lungs suggestive of acute or chronic bronchitis with superimposed chronic small airways disease.  No lobar pneumonia or pulmonary edema.  No pleural effusion.  Patient was given IV Solu-Medrol  125 mg, IV ceftriaxone  doxycycline , DuoNeb x 2.   The hospitalist service was consulted to admit.  Review of Systems: All systems reviewed and are negative except as documented in history of present illness above.   Past Medical History:  Diagnosis Date   Aortic atherosclerosis (HCC)    Asthma    Breast CA (HCC)    Cancer (HCC)    right breast mastectomy   CVA (cerebral vascular accident) (HCC) 04/27/2015   DDD (degenerative disc disease), cervical    Diabetes mellitus without complication (HCC)    Duodenal ulcer    External hemorrhoids    Generalized anxiety disorder    H/O chest pain 03/24/2006   normal nuclear  study   Headache    High cholesterol    Hypertension    Mild aortic insufficiency 03/28/2006   echo   Normal cardiac stress test 2005   Pancreatitis    Personal history of chemotherapy    Personal history of radiation therapy    PONV (postoperative nausea and vomiting)    Sinus bradycardia on ECG    Stroke [facility])     Past Surgical History:  Procedure Laterality Date   ABDOMINAL HYSTERECTOMY     APPENDECTOMY     "30 years ago"   childbirth     x 2   CHOLECYSTECTOMY     "15 years ago"   ESOPHAGOGASTRODUODENOSCOPY N/A 03/04/2014   Procedure: ESOPHAGOGASTRODUODENOSCOPY (EGD);  Surgeon: Almeda Aris, MD;  Location: [facility] ENDOSCOPY;  Service: Endoscopy;  Laterality: N/A;   MASTECTOMY  1993   right breast   RIGHT/LEFT HEART CATH AND CORONARY ANGIOGRAPHY N/A  05/30/2019   Procedure: RIGHT/LEFT HEART CATH AND CORONARY ANGIOGRAPHY;  Surgeon: Lucendia Rusk, MD;  Location: [facility] INVASIVE CV LAB;  Service: Cardiovascular;  Laterality: N/A;   TUBAL LIGATION      Social History: Social History   Tobacco Use   Smoking status: Never   Smokeless tobacco: Never  Vaping Use   Vaping status: Never Used  Substance Use Topics   Alcohol use: No    Alcohol/week: 0.0 standard drinks of alcohol   Drug use: No   Allergies  Allergen Reactions   Oxycodone -Acetaminophen  Nausea And Vomiting   Ciprofloxacin  Nausea And  Vomiting    Other reaction(s): Other (See Comments)   Codeine Nausea And Vomiting   Doxycycline  Nausea And Vomiting    REACTION: GI upset   Percocet [Oxycodone -Acetaminophen ] Nausea And Vomiting   Tramadol  Nausea And Vomiting    Other reaction(s): Other (See Comments)    Family History  Problem Relation Age of Onset   Heart attack Mother    Heart disease Mother    Hypertension Mother    Asthma Mother    Heart attack Father    Heart disease Father    Hypertension Sister    Heart attack Sister    Colon cancer Sister 60   Hypertension Sister    Heart attack Sister    Heart attack Sister    Lung disease Daughter    Thyroid  disease Daughter      Prior to Admission medications   Medication Sig Start Date End Date Taking? Authorizing Provider  azithromycin  (ZITHROMAX ) 250 MG tablet Take by mouth. 02/09/24  Yes [provider]  ipratropium (ATROVENT ) 0.03 % nasal spray Place 2 sprays into both nostrils 2 (two) times daily. 02/09/24  Yes [provider]  promethazine -dextromethorphan (PROMETHAZINE -DM) 6.25-15 MG/5ML syrup Take 5 mLs by mouth every 6 (six) hours as needed. 02/09/24  Yes [provider]  acetaminophen  (TYLENOL ) 500 MG tablet Take 500 mg by mouth every 6 (six) hours as needed. As needed for pain    [provider]  albuterol  (VENTOLIN  HFA) 108 (90 Base) MCG/ACT inhaler Inhale 2 puffs into the lungs every 6 (six) hours as needed. 02/08/24   Leath-Warren, Belen Bowers, NP  amLODipine  (NORVASC ) 5 MG tablet Take 5 mg by mouth daily.    [provider]  atorvastatin  (LIPITOR ) 80 MG tablet Take 1 tablet (80 mg total) by mouth daily at 6 PM. 07/06/15   Consuelo Denmark, MD  clopidogrel  (PLAVIX ) 75 MG tablet Take 1 tablet (75 mg total) by mouth daily. 06/01/17   Consuelo Denmark, MD  dapagliflozin  propanediol (FARXIGA ) 10 MG TABS tablet Take 1 tablet (10 mg total) by mouth daily before breakfast. 03/01/23   [name], Lela Purple, MD  DULoxetine  (CYMBALTA ) 30  MG capsule Take 30 mg by mouth daily. 05/05/21   [provider]  famotidine  (PEPCID ) 20 MG tablet One after supper 12/23/22   Diamond Formica, MD  guaiFENesin  (ROBITUSSIN) 100 MG/5ML liquid Take 10 mLs by mouth every 6 (six) hours as needed for cough or to loosen phlegm. 02/08/24   Leath-Warren, Belen Bowers, NP  JARDIANCE  10 MG TABS tablet Take 10 mg by mouth daily. 03/03/23   [provider]  metFORMIN  (GLUCOPHAGE ) 500 MG tablet Take 1 tablet (500 mg total) by mouth daily with breakfast. 10/05/22 12/23/22  Lesa Rape, MD  montelukast  (SINGULAIR ) 10 MG tablet Take 1 tablet (10 mg total) by mouth at bedtime. 05/29/21   Cala Castleman, MD  ondansetron  (ZOFRAN )  4 MG tablet Take 1 tablet (4 mg total) by mouth every 8 (eight) hours as needed for nausea or vomiting. 09/03/23   Alissa April, MD  ondansetron  (ZOFRAN -ODT) 4 MG disintegrating tablet Take 1 tablet (4 mg total) by mouth every 8 (eight) hours as needed for nausea or vomiting. 09/03/23   Alissa April, MD  pantoprazole  (PROTONIX ) 40 MG tablet Take 30-60 min before first meal of the day 12/23/22   Diamond Formica, MD  predniSONE  (DELTASONE ) 20 MG tablet Take 1 tablet (20 mg total) by mouth daily with breakfast for 7 days. 02/08/24 02/15/24  Hardy Lia, NP    Physical Exam: Vitals:   02/15/24 1526 02/15/24 1532 02/15/24 1604 02/15/24 1735  BP: (!) 173/87   (!) 155/79  Pulse: 98   (!) 104  Resp: 19   18  Temp:   98.4 F (36.9 C) 98 F (36.7 C)  TempSrc:   Oral   SpO2: 99% 100%  96%  Weight:       Constitutional: Resting in bed, NAD, calm, comfortable Eyes: EOMI, lids and conjunctivae normal ENMT: Mucous membranes are moist. Posterior pharynx clear of any exudate or lesions.Normal dentition.  Neck: normal, supple, no masses. Respiratory: Coarse expiratory wheezing throughout. Normal respiratory effort while on 3 L O2 via [place]. No accessory muscle use.  Cardiovascular: Regular rate and rhythm, no murmurs / rubs /  gallops. No extremity edema. 2+ pedal pulses. Abdomen: no tenderness, no masses palpated. Musculoskeletal: no clubbing / cyanosis. No joint deformity upper and lower extremities. Good ROM, no contractures. Normal muscle tone.  Skin: no rashes, lesions, ulcers. No induration Neurologic: Sensation intact. Strength 5/5 in all 4.  Psychiatric: Normal judgment and insight. Alert and oriented x 3. Normal mood.   EKG: Personally reviewed. Sinus rhythm, rate 96, no acute ischemic changes.  Assessment/Plan Principal Problem:   Acute respiratory failure with hypoxia (HCC) Active Problems:   Asthma, chronic, unspecified asthma severity, with acute exacerbation   Acute bronchitis   Type 2 diabetes mellitus with hyperglycemia (HCC)   Chronic diastolic CHF (congestive heart failure) (HCC)   Hypertension associated with diabetes (HCC)   Mixed hyperlipidemia due to type 2 diabetes mellitus (HCC)   History of stroke   [name] is a 83 y.o. female with medical history significant for chronic HFpEF, history of CVA, asthma, T2DM, HTN, HLD, breast cancer, GAD who is admitted with hypoxia due to bronchitis with asthma exacerbation.  Assessment and Plan: Acute respiratory failure with hypoxia due to acute bronchitis with asthma exacerbation: Persistent symptoms failing outpatient management with new hypoxia, SpO2 86% on room air.  Requiring 3 L O2 via [place] at time of admission.  She has continued coarse wheezing throughout the lung fields despite initial management in the ED.  CTA chest negative for PE, pulmonary edema, or pneumonia. - Start Brovana/Pulmicort bid, DuoNebs as needed - Prednisone  40 mg daily - Continue azithromycin  - Continue Singulair  - Continue supplemental O2 and wean as able  Type 2 diabetes: Hyperglycemic in setting of steroid use.  Hemoglobin A1c is 8.3%.  Home meds on hold for now.  Placed on SSI.  Chronic HFpEF: Appears euvolemic.  Not requiring diuretics at baseline.  Last EF  70-75% by TTE 10/02/22.  History of CVA: Continue Plavix  and atorvastatin .  Hypertension: Continue amlodipine .  Hyperlipidemia: Continue atorvastatin .  GERD: Continue Protonix .  GAD: Continue Cymbalta .   DVT prophylaxis: enoxaparin  (LOVENOX ) injection 40 mg Start: 02/15/24 1945 Code Status: Full code, confirmed with  patient on admission Family Communication: Discussed with patient, she has discussed with family Disposition Plan: From home and likely discharged home pending clinical progress Consults called: None Severity of Illness: The appropriate patient status for this patient is INPATIENT. Inpatient status is judged to be reasonable and necessary in order to provide the required intensity of service to ensure the patient's safety. The patient's presenting symptoms, physical exam findings, and initial radiographic and laboratory data in the context of their chronic comorbidities is felt to place them at high risk for further clinical deterioration. Furthermore, it is not anticipated that the patient will be medically stable for discharge from the hospital within 2 midnights of admission.   * I certify that at the point of admission it is my clinical judgment that the patient will require inpatient hospital care spanning beyond 2 midnights from the point of admission due to high intensity of service, high risk for further deterioration and high frequency of surveillance required.Edith Gores MD [facility]  If 7PM-7AM, please contact night-coverage [email or web address]  02/15/2024, 7:18 PM

## 2024-02-15 NOTE — ED Provider Notes (Signed)
 [place] EMERGENCY DEPARTMENT AT [facility] Provider Note   CSN: [id number] Arrival date & time: 02/15/24  1051     History  Chief Complaint  Patient presents with   Shortness of Breath    [name] is a 83 y.o. female presenting to ED with 3 weeks of cough, sore throat, fatigue.  Patient reports this began 3 weeks ago and she went to an urgent care where she was prescribed prednisone  and a cough medication.  This helped only a little bit.  She then saw her PCP who prescribed her azithromycin  that she completed.  She has not felt much better.  She denies fevers or chills.  She reports shortness of breath and persistent coughing.  She reports soreness in the bottom of her throat with swallowing and eating.  She denies prior history of smoking or underlying lung disease.  HPI     Home Medications Prior to Admission medications   Medication Sig Start Date End Date Taking? Authorizing Provider  azithromycin  (ZITHROMAX ) 250 MG tablet Take by mouth. 02/09/24  Yes [provider]  ipratropium (ATROVENT ) 0.03 % nasal spray Place 2 sprays into both nostrils 2 (two) times daily. 02/09/24  Yes [provider]  promethazine -dextromethorphan (PROMETHAZINE -DM) 6.25-15 MG/5ML syrup Take 5 mLs by mouth every 6 (six) hours as needed. 02/09/24  Yes [provider]  acetaminophen  (TYLENOL ) 500 MG tablet Take 500 mg by mouth every 6 (six) hours as needed. As needed for pain    [provider]  albuterol  (VENTOLIN  HFA) 108 (90 Base) MCG/ACT inhaler Inhale 2 puffs into the lungs every 6 (six) hours as needed. 02/08/24   Leath-Warren, Belen Bowers, NP  amLODipine  (NORVASC ) 5 MG tablet Take 5 mg by mouth daily.    [provider]  atorvastatin  (LIPITOR ) 80 MG tablet Take 1 tablet (80 mg total) by mouth daily at 6 PM. 07/06/15   Consuelo Denmark, MD  clopidogrel  (PLAVIX ) 75 MG tablet Take 1 tablet (75 mg total) by mouth daily. 06/01/17   Consuelo Denmark, MD   dapagliflozin  propanediol (FARXIGA ) 10 MG TABS tablet Take 1 tablet (10 mg total) by mouth daily before breakfast. 03/01/23   [name], Lela Purple, MD  DULoxetine  (CYMBALTA ) 30 MG capsule Take 30 mg by mouth daily. 05/05/21   [provider]  famotidine  (PEPCID ) 20 MG tablet One after supper 12/23/22   Diamond Formica, MD  guaiFENesin  (ROBITUSSIN) 100 MG/5ML liquid Take 10 mLs by mouth every 6 (six) hours as needed for cough or to loosen phlegm. 02/08/24   Leath-Warren, Belen Bowers, NP  JARDIANCE  10 MG TABS tablet Take 10 mg by mouth daily. 03/03/23   [provider]  metFORMIN  (GLUCOPHAGE ) 500 MG tablet Take 1 tablet (500 mg total) by mouth daily with breakfast. 10/05/22 12/23/22  Lesa Rape, MD  montelukast  (SINGULAIR ) 10 MG tablet Take 1 tablet (10 mg total) by mouth at bedtime. 05/29/21   Singh, Prashant K, MD  ondansetron  (ZOFRAN ) 4 MG tablet Take 1 tablet (4 mg total) by mouth every 8 (eight) hours as needed for nausea or vomiting. 09/03/23   Alissa April, MD  ondansetron  (ZOFRAN -ODT) 4 MG disintegrating tablet Take 1 tablet (4 mg total) by mouth every 8 (eight) hours as needed for nausea or vomiting. 09/03/23   Alissa April, MD  pantoprazole  (PROTONIX ) 40 MG tablet Take 30-60 min before first meal of the day 12/23/22   Diamond Formica, MD  predniSONE  (DELTASONE ) 20 MG tablet Take 1 tablet (20  mg total) by mouth daily with breakfast for 7 days. 02/08/24 02/15/24  Leath-Warren, Belen Bowers, NP      Allergies    Oxycodone -acetaminophen , Ciprofloxacin , Codeine, Doxycycline , Percocet [oxycodone -acetaminophen ], and Tramadol     Review of Systems   Review of Systems  Physical Exam Updated Vital Signs BP (!) 173/87   Pulse 98   Temp 98.1 F (36.7 C) (Oral)   Resp 19   Wt 68 kg   SpO2 100%   BMI 24.96 kg/m  Physical Exam Constitutional:      General: She is not in acute distress. HENT:     Head: Normocephalic and atraumatic.  Eyes:     Conjunctiva/sclera: Conjunctivae normal.      Pupils: Pupils are equal, round, and reactive to light.  Cardiovascular:     Rate and Rhythm: Normal rate and regular rhythm.  Pulmonary:     Effort: Pulmonary effort is normal. No respiratory distress.     Comments: Crackles in the right middle and lower lobe Abdominal:     General: There is no distension.     Tenderness: There is no abdominal tenderness.  Skin:    General: Skin is warm and dry.  Neurological:     General: No focal deficit present.     Mental Status: She is alert. Mental status is at baseline.  Psychiatric:        Mood and Affect: Mood normal.        Behavior: Behavior normal.     ED Results / Procedures / Treatments   Labs (all labs ordered are listed, but only abnormal results are displayed) Labs Reviewed  BASIC METABOLIC PANEL WITH GFR - Abnormal; Notable for the following components:      Result Value   Glucose, Bld 286 (*)    All other components within normal limits  CBC - Abnormal; Notable for the following components:   WBC 11.5 (*)    All other components within normal limits  RESP PANEL BY RT-PCR (RSV, FLU A&B, COVID)  [id number]  GROUP A STREP BY PCR  PRO BRAIN NATRIURETIC PEPTIDE  HEMOGLOBIN A1C  TROPONIN T, HIGH SENSITIVITY    EKG EKG Interpretation Date/Time:  Thursday February 15 2024 11:14:53 EDT Ventricular Rate:  96 PR Interval:  167 QRS Duration:  77 QT Interval:  346 QTC Calculation: 438 R Axis:   -15  Text Interpretation: Sinus rhythm Abnormal R-wave progression, early transition LVH with secondary repolarization abnormality Confirmed by Jerald Molly [phone]) on 02/15/2024 11:49:04 AM  Radiology CT Angio Chest PE W and/or Wo Contrast Result Date: 02/15/2024 CLINICAL DATA:  Pulmonary embolism (PE) suspected, high prob EXAM: CT ANGIOGRAPHY CHEST WITH CONTRAST TECHNIQUE: Multidetector CT imaging of the chest was performed using the standard protocol during bolus administration of intravenous contrast. Multiplanar CT image reconstructions  and MIPs were obtained to evaluate the vascular anatomy. RADIATION DOSE REDUCTION: This exam was performed according to the departmental dose-optimization program which includes automated exposure control, adjustment of the mA and/or kV according to patient size and/or use of iterative reconstruction technique. CONTRAST:  80mL OMNIPAQUE  IOHEXOL  350 MG/ML SOLN COMPARISON:  September 10, 2023 FINDINGS: Pulmonary Embolism: No pulmonary embolism. Cardiovascular: No cardiomegaly or pericardial effusion. No aortic aneurysm. Dense multi-vessel coronary atherosclerosis. Scattered calcified aortic atherosclerosis. Mediastinum/Nodes: No mediastinal mass. No mediastinal, hilar, or axillary lymphadenopathy. Right axillary lymph node dissection changes. Lungs/Pleura: The midline trachea is collapsed, likely due to expiratory phase imaging. Mild diffuse bronchial wall thickening. Extensive mosaic attenuation throughout the lungs. Reticulation  in the lung bases, possibly reflecting atelectasis. No focal airspace consolidation, pleural effusion, or pneumothorax. Musculoskeletal: No acute fracture or destructive bone lesion. Osteopenia. Thoracic DISH. Right breast implant. Upper Abdomen: Cholecystectomy. No acute abnormality in the partially visualized upper abdomen. Review of the MIP images confirms the above findings. IMPRESSION: 1. No pulmonary embolism. 2. Mild diffuse bronchial wall thickening with extensive mosaic attenuation throughout the lungs, worrisome for acute or chronic bronchitis with superimposed chronic small airways disease. No lobar pneumonia or pulmonary edema. No pleural effusion. Aortic Atherosclerosis (ICD10-I70.0). Electronically Signed   By: Rance Burrows M.D.   On: 02/15/2024 13:48   DG Chest 2 View Result Date: 02/15/2024 CLINICAL DATA:  Shortness of breath. EXAM: CHEST - 2 VIEW COMPARISON:  June 08, 2023. FINDINGS: The heart size and mediastinal contours are within normal limits. Both lungs are  clear. The visualized skeletal structures are unremarkable. IMPRESSION: No active cardiopulmonary disease. Electronically Signed   By: Rosalene Colon M.D.   On: 02/15/2024 11:39    Procedures Procedures    Medications Ordered in ED Medications  ondansetron  (ZOFRAN ) injection 4 mg (4 mg Intravenous Not Given 02/15/24 1226)  doxycycline  (VIBRAMYCIN ) 100 mg in sodium chloride  0.9 % 250 mL IVPB (100 mg Intravenous New Bag/Given 02/15/24 1445)  insulin  aspart (novoLOG ) injection 0-15 Units (has no administration in time range)  insulin  aspart (novoLOG ) injection 0-5 Units (has no administration in time range)  ipratropium-albuterol  (DUONEB) 0.5-2.5 (3) MG/3ML nebulizer solution 3 mL (3 mLs Nebulization Given 02/15/24 1219)  iohexol  (OMNIPAQUE ) 350 MG/ML injection 100 mL (80 mLs Intravenous Contrast Given 02/15/24 1244)  methylPREDNISolone  sodium succinate (SOLU-MEDROL ) 125 mg/2 mL injection 125 mg (125 mg Intravenous Given 02/15/24 1452)  cefTRIAXone  (ROCEPHIN ) 1 g in sodium chloride  0.9 % 100 mL IVPB (1 g Intravenous New Bag/Given 02/15/24 1454)  ipratropium-albuterol  (DUONEB) 0.5-2.5 (3) MG/3ML nebulizer solution 3 mL (3 mLs Nebulization Given 02/15/24 1531)    ED Course/ Medical Decision Making/ A&P Clinical Course as of 02/15/24 1546  Thu Feb 15, 2024  1428 With hypoxia and new oxygen requirement will admit for bronchitis and bronchopneumonia - antibiotics ordered [MT]    Clinical Course User Index [MT] Dametra Whetsel, Janalyn Me, MD                                 Medical Decision Making Amount and/or Complexity of Data Reviewed Labs: ordered. Radiology: ordered.  Risk Prescription drug management. Decision regarding hospitalization.   This patient presents to the ED with concern for cough, shortness of breath. This involves an extensive number of treatment options, and is a complaint that carries with it a high risk of complications and morbidity.  The differential diagnosis includes pneumonia  versus new onset heart failure versus pleural effusion versus other  External records from outside source obtained and reviewed including urgent care evaluation 1 week ago on 529 for potential viral URI with cough.  Patient given breathing treatment and Mucinex  per my review of the discharge summary.  I ordered and personally interpreted labs.  The pertinent results include: No emergent findings.  Troponin undetectable, BNP normal  I ordered imaging studies including x-ray of the chest, CT PE study I independently visualized and interpreted imaging which showed no acute PE, bronchitis changes I agree with the radiologist interpretation  The patient was maintained on a cardiac monitor.  I personally viewed and interpreted the cardiac monitored which showed an underlying rhythm  of: Normal rhythm  Per my interpretation the patient's ECG shows no acute ischemic findings  I ordered medication including IV antibiotics for CAP/bronchopneumonitis, IV steroids and duonebs for wheezing/reactive airway disease  I have reviewed the patients home medicines and have made adjustments as needed  Test Considered: doubt ACS, sepsis   After the interventions noted above, I reevaluated the patient and found that they have: stayed the same - some wheezing improvement, patient stable on 2-3L [place], but still requiring oxygen   Disposition:  After consideration of the diagnostic results and the patient's response to treatment, I feel that the patient would benefit from medical admission.         Final Clinical Impression(s) / ED Diagnoses Final diagnoses:  Hypoxia  Bronchitis    Rx / DC Orders ED Discharge Orders     None         Kiernan Farkas, Janalyn Me, MD 02/15/24 1547

## 2024-02-16 DIAGNOSIS — F411 Generalized anxiety disorder: Secondary | ICD-10-CM | POA: Insufficient documentation

## 2024-02-16 DIAGNOSIS — J9601 Acute respiratory failure with hypoxia: Secondary | ICD-10-CM | POA: Diagnosis not present

## 2024-02-16 DIAGNOSIS — J45901 Unspecified asthma with (acute) exacerbation: Secondary | ICD-10-CM

## 2024-02-16 DIAGNOSIS — E1165 Type 2 diabetes mellitus with hyperglycemia: Secondary | ICD-10-CM

## 2024-02-16 DIAGNOSIS — J209 Acute bronchitis, unspecified: Secondary | ICD-10-CM

## 2024-02-16 LAB — CBC
HCT: 40.9 % (ref 36.0–46.0)
Hemoglobin: 13.8 g/dL (ref 12.0–15.0)
MCH: 29.8 pg (ref 26.0–34.0)
MCHC: 33.7 g/dL (ref 30.0–36.0)
MCV: 88.3 fL (ref 80.0–100.0)
Platelets: 231 10*3/uL (ref 150–400)
RBC: 4.63 MIL/uL (ref 3.87–5.11)
RDW: 13.3 % (ref 11.5–15.5)
WBC: 10.7 10*3/uL — ABNORMAL HIGH (ref 4.0–10.5)
nRBC: 0 % (ref 0.0–0.2)

## 2024-02-16 LAB — RESPIRATORY PANEL BY PCR

## 2024-02-16 LAB — GLUCOSE, CAPILLARY
Glucose-Capillary: 233 mg/dL — ABNORMAL HIGH (ref 70–99)
Glucose-Capillary: 439 mg/dL — ABNORMAL HIGH (ref 70–99)
Glucose-Capillary: 446 mg/dL — ABNORMAL HIGH (ref 70–99)
Glucose-Capillary: 373 mg/dL — ABNORMAL HIGH (ref 70–99)
Glucose-Capillary: 313 mg/dL — ABNORMAL HIGH (ref 70–99)
Glucose-Capillary: 367 mg/dL — ABNORMAL HIGH (ref 70–99)
Glucose-Capillary: 393 mg/dL — ABNORMAL HIGH (ref 70–99)

## 2024-02-16 LAB — BASIC METABOLIC PANEL WITH GFR
Anion gap: 10 (ref 5–15)
BUN: 20 mg/dL (ref 8–23)
CO2: 24 mmol/L (ref 22–32)
Calcium: 9.1 mg/dL (ref 8.9–10.3)
Chloride: 103 mmol/L (ref 98–111)
Creatinine, Ser: 1.02 mg/dL — ABNORMAL HIGH (ref 0.44–1.00)
GFR, Estimated: 54 mL/min — ABNORMAL LOW (ref >60)
Glucose, Bld: 333 mg/dL — ABNORMAL HIGH (ref 70–99)
Potassium: 4.6 mmol/L (ref 3.5–5.1)
Sodium: 137 mmol/L (ref 135–145)

## 2024-02-16 MED ORDER — HYDROCOD POLI-CHLORPHE POLI ER 10-8 MG/5ML PO SUER
5.0000 mL | Freq: Two times a day (BID) | ORAL | Status: DC
  Administered 2024-02-16 – 2024-02-18 (×4): 5 mL via ORAL
  Filled 2024-02-16 (×4): qty 5

## 2024-02-16 MED ORDER — INSULIN GLARGINE-YFGN 100 UNIT/ML ~~LOC~~ SOLN
15.0000 [IU] | Freq: Every day | SUBCUTANEOUS | Status: DC
  Administered 2024-02-16: 15 [IU] via SUBCUTANEOUS
  Filled 2024-02-16 (×2): qty 0.15

## 2024-02-16 MED ORDER — HYDROCOD POLI-CHLORPHE POLI ER 10-8 MG/5ML PO SUER
5.0000 mL | Freq: Two times a day (BID) | ORAL | Status: DC
  Filled 2024-02-16: qty 5

## 2024-02-16 MED ORDER — IPRATROPIUM-ALBUTEROL 0.5-2.5 (3) MG/3ML IN SOLN
3.0000 mL | Freq: Four times a day (QID) | RESPIRATORY_TRACT | Status: DC
  Administered 2024-02-16 – 2024-02-17 (×5): 3 mL via RESPIRATORY_TRACT
  Filled 2024-02-16 (×5): qty 3

## 2024-02-16 MED ORDER — INSULIN ASPART 100 UNIT/ML IJ SOLN
20.0000 [IU] | INTRAMUSCULAR | Status: AC
  Administered 2024-02-16: 20 [IU] via SUBCUTANEOUS

## 2024-02-16 NOTE — Assessment & Plan Note (Addendum)
-   Recently completed course of antibiotics prior to hospitalization but no significant improvement and found to be hypoxic -Continue with Brovana  and Pulmicort .  Add scheduled DuoNebs - Continue prednisone  - Continue azithromycin  - COVID swab negative - RVP positive for parainfluenza 3

## 2024-02-16 NOTE — Assessment & Plan Note (Addendum)
-   Due to presumed bronchitis with asthma exacerbation - Not on oxygen at home - Weaned to room air prior to discharge and ambulated well with no desaturation.  Lowest 94% on room air ambulating

## 2024-02-16 NOTE — Assessment & Plan Note (Signed)
 Continue Plavix and Lipitor

## 2024-02-16 NOTE — Assessment & Plan Note (Addendum)
-   A1c 8.3% - Expected hyperglycemia in setting of steroid use - Glucose levels controlled at home - Treated with Semglee  and sliding scale during hospitalization while having hyperglycemia.  Steroid not continued at discharge

## 2024-02-16 NOTE — Assessment & Plan Note (Signed)
 Continue Cymbalta.

## 2024-02-16 NOTE — Progress Notes (Signed)
 Progress Note    [name]   EAV:[id number]  DOB: 03-08-1941  DOA: 02/15/2024     1 PCP: Benedetto Brady, MD  Initial CC: SOB, cough  Hospital Course: [name] is a 83 y.o. female with medical history significant for chronic HFpEF, history of CVA, asthma, T2DM, HTN, HLD, breast cancer, GAD who is admitted with hypoxia due to bronchitis with asthma exacerbation.  Interval History:  Breathing a little bit more comfortably this morning.  Still wheezing and still on oxygen.  Cough suppressants she was on at home prior to admission was helping some as well.  Assessment and Plan: * Acute respiratory failure with hypoxia (HCC) - Due to presumed bronchitis with asthma exacerbation - Not on oxygen at home - Continue weaning as able  Acute bronchitis - Recently completed course of antibiotics prior to hospitalization but no significant improvement and found to be hypoxic -Continue with Brovana and Pulmicort.  Add scheduled DuoNebs - Continue prednisone  - Continue azithromycin  - Checking COVID and RVP swabs.  Asthma, chronic, unspecified asthma severity, with acute exacerbation - see bronchitis treatment   Type 2 diabetes mellitus with hyperglycemia (HCC) - A1c 8.3% - Expected hyperglycemia in setting of steroid use - Adding Semglee  - Continue SSI and CBG monitoring; will adjust as necessary  Chronic diastolic CHF (congestive heart failure) (HCC) Appears euvolemic.  Not requiring diuretics at baseline.  Last EF 70-75% by TTE 10/02/22  Hypertension associated with diabetes (HCC) - Continue amlodipine   Mixed hyperlipidemia due to type 2 diabetes mellitus (HCC) - Continue Lipitor   GERD without esophagitis - Continue Protonix   GAD (generalized anxiety disorder) - Continue Cymbalta   History of stroke - Continue Plavix  and Lipitor    Old records reviewed in assessment of this patient  Antimicrobials: Rocephin  and doxycycline  02/15/2024 x 1 Azithromycin  02/16/2024 >>  current  DVT prophylaxis:  enoxaparin  (LOVENOX ) injection 40 mg Start: 02/15/24 1945   Code Status:   Code Status: Full Code  Mobility Assessment (Last 72 Hours)     Mobility Assessment     Row Name 02/15/24 2002 02/15/24 1700         Does patient have an order for bedrest or is patient medically unstable No - Continue assessment No - Continue assessment      What is the highest level of mobility based on the progressive mobility assessment? Level 5 (Walks with assist in room/hall) - Balance while stepping forward/back and can walk in room with assist - Complete Level 5 (Walks with assist in room/hall) - Balance while stepping forward/back and can walk in room with assist - Complete               Barriers to discharge: None Disposition Plan: Home HH orders placed: N/A Status is: Inpatient  Objective: Blood pressure (!) 147/78, pulse 100, temperature 98 F (36.7 C), resp. rate 18, height 5\' 5"  (1.651 m), weight 68 kg, SpO2 99%.  Examination:  Physical Exam Constitutional:      Appearance: Normal appearance.  HENT:     Head: Normocephalic and atraumatic.     Mouth/Throat:     Mouth: Mucous membranes are moist.  Eyes:     Extraocular Movements: Extraocular movements intact.  Cardiovascular:     Rate and Rhythm: Normal rate and regular rhythm.  Pulmonary:     Effort: Pulmonary effort is normal. No respiratory distress.     Breath sounds: Wheezing present.     Comments: Coarse breath sounds bilaterally  Abdominal:  General: Bowel sounds are normal. There is no distension.     Palpations: Abdomen is soft.     Tenderness: There is no abdominal tenderness.  Musculoskeletal:        General: Normal range of motion.     Cervical back: Normal range of motion and neck supple.  Skin:    General: Skin is warm and dry.  Neurological:     General: No focal deficit present.     Mental Status: She is alert.  Psychiatric:        Mood and Affect: Mood normal.       Consultants:    Procedures:    Data Reviewed: Results for orders placed or performed during the hospital encounter of 02/15/24 (from the past 24 hours)  Group A Strep by PCR     Status: None   Collection Time: 02/15/24 12:40 PM   Specimen: Throat; Sterile Swab  Result Value Ref Range   Group A Strep by PCR NOT DETECTED NOT DETECTED  Glucose, capillary     Status: Abnormal   Collection Time: 02/15/24  5:41 PM  Result Value Ref Range   Glucose-Capillary 233 (H) 70 - 99 mg/dL  Hemoglobin B1Y     Status: Abnormal   Collection Time: 02/15/24  6:17 PM  Result Value Ref Range   Hgb A1c MFr Bld 8.3 (H) 4.8 - 5.6 %   Mean Plasma Glucose 191.51 mg/dL  Glucose, capillary     Status: Abnormal   Collection Time: 02/15/24  8:52 PM  Result Value Ref Range   Glucose-Capillary 422 (H) 70 - 99 mg/dL  Glucose, capillary     Status: Abnormal   Collection Time: 02/15/24  9:40 PM  Result Value Ref Range   Glucose-Capillary 380 (H) 70 - 99 mg/dL  Basic metabolic panel     Status: Abnormal   Collection Time: 02/16/24  3:12 AM  Result Value Ref Range   Sodium 137 135 - 145 mmol/L   Potassium 4.6 3.5 - 5.1 mmol/L   Chloride 103 98 - 111 mmol/L   CO2 24 22 - 32 mmol/L   Glucose, Bld 333 (H) 70 - 99 mg/dL   BUN 20 8 - 23 mg/dL   Creatinine, Ser 7.82 (H) 0.44 - 1.00 mg/dL   Calcium  9.1 8.9 - 10.3 mg/dL   GFR, Estimated 54 (L) >60 mL/min   Anion gap 10 5 - 15  CBC     Status: Abnormal   Collection Time: 02/16/24  3:12 AM  Result Value Ref Range   WBC 10.7 (H) 4.0 - 10.5 K/uL   RBC 4.63 3.87 - 5.11 MIL/uL   Hemoglobin 13.8 12.0 - 15.0 g/dL   HCT 95.6 21.3 - 08.6 %   MCV 88.3 80.0 - 100.0 fL   MCH 29.8 26.0 - 34.0 pg   MCHC 33.7 30.0 - 36.0 g/dL   RDW 57.8 46.9 - 62.9 %   Platelets 231 150 - 400 K/uL   nRBC 0.0 0.0 - 0.2 %  Glucose, capillary     Status: Abnormal   Collection Time: 02/16/24  8:47 AM  Result Value Ref Range   Glucose-Capillary 446 (H) 70 - 99 mg/dL  Glucose, capillary      Status: Abnormal   Collection Time: 02/16/24  9:11 AM  Result Value Ref Range   Glucose-Capillary 439 (H) 70 - 99 mg/dL  Glucose, capillary     Status: Abnormal   Collection Time: 02/16/24  9:42 AM  Result Value Ref  Range   Glucose-Capillary 373 (H) 70 - 99 mg/dL  Glucose, capillary     Status: Abnormal   Collection Time: 02/16/24 12:03 PM  Result Value Ref Range   Glucose-Capillary 313 (H) 70 - 99 mg/dL    I have reviewed pertinent nursing notes, vitals, labs, and images as necessary. I have ordered labwork to follow up on as indicated.  I have reviewed the last notes from staff over past 24 hours. I have discussed patient's care plan and test results with nursing staff, CM/SW, and other staff as appropriate.  Time spent: Greater than 50% of the 55 minute visit was spent in counseling/coordination of care for the patient as laid out in the A&P.   LOS: 1 day   Faith Homes, MD [facility] 02/16/2024, 12:32 PM

## 2024-02-16 NOTE — Assessment & Plan Note (Signed)
-

## 2024-02-16 NOTE — Inpatient Diabetes Management (Signed)
 Inpatient Diabetes Program Recommendations  AACE/ADA: New Consensus Statement on Inpatient Glycemic Control (2015)  Target Ranges:  Prepandial:   less than 140 mg/dL      Peak postprandial:   less than 180 mg/dL (1-2 hours)      Critically ill patients:  140 - 180 mg/dL   Lab Results  Component Value Date   GLUCAP 373 (H) 02/16/2024   HGBA1C 8.3 (H) 02/15/2024    Review of Glycemic Control  Latest Reference Range & Units 02/15/24 17:41 02/15/24 20:52 02/15/24 21:40 02/16/24 08:47 02/16/24 09:11 02/16/24 09:42  Glucose-Capillary 70 - 99 mg/dL 295 (H) 621 (H) 308 (H) 446 (H) 439 (H) 373 (H)   Diabetes history: DM 2 Outpatient Diabetes medications:  Jardiance  10 mg daily Current orders for Inpatient glycemic control:  Novolog  0-15 units tid with meals and HS Semglee  15 units daily Prednisone  40 mg daily with breakfast Inpatient Diabetes Program Recommendations:    Note elevated CBG's with steroids.  Consider adding Novolog  meal coverage 5 units tid with meals (hold if patient eats less than 50% or NPO).   Thanks,  Josefa Ni, RN, BC-ADM Inpatient Diabetes Coordinator Pager [phone]  (8a-5p)

## 2024-02-16 NOTE — Assessment & Plan Note (Signed)
 Appears euvolemic.  Not requiring diuretics at baseline.  Last EF 70-75% by TTE 10/02/22

## 2024-02-16 NOTE — Assessment & Plan Note (Signed)
 -  Continue Lipitor

## 2024-02-16 NOTE — Assessment & Plan Note (Signed)
-   see bronchitis treatment

## 2024-02-16 NOTE — Plan of Care (Signed)

## 2024-02-16 NOTE — Assessment & Plan Note (Signed)
 Continue amlodipine

## 2024-02-17 DIAGNOSIS — B348 Other viral infections of unspecified site: Secondary | ICD-10-CM | POA: Diagnosis not present

## 2024-02-17 DIAGNOSIS — J9601 Acute respiratory failure with hypoxia: Secondary | ICD-10-CM | POA: Diagnosis not present

## 2024-02-17 DIAGNOSIS — J209 Acute bronchitis, unspecified: Secondary | ICD-10-CM | POA: Diagnosis not present

## 2024-02-17 LAB — GLUCOSE, CAPILLARY
Glucose-Capillary: 332 mg/dL — ABNORMAL HIGH (ref 70–99)
Glucose-Capillary: 288 mg/dL — ABNORMAL HIGH (ref 70–99)
Glucose-Capillary: 355 mg/dL — ABNORMAL HIGH (ref 70–99)
Glucose-Capillary: 433 mg/dL — ABNORMAL HIGH (ref 70–99)
Glucose-Capillary: 463 mg/dL — ABNORMAL HIGH (ref 70–99)

## 2024-02-17 MED ORDER — MELATONIN 3 MG PO TABS
6.0000 mg | ORAL_TABLET | Freq: Every day | ORAL | Status: DC
  Administered 2024-02-17: 6 mg via ORAL
  Filled 2024-02-17: qty 2

## 2024-02-17 MED ORDER — INSULIN GLARGINE-YFGN 100 UNIT/ML ~~LOC~~ SOLN
23.0000 [IU] | Freq: Every day | SUBCUTANEOUS | Status: DC
  Administered 2024-02-17 – 2024-02-18 (×2): 23 [IU] via SUBCUTANEOUS
  Filled 2024-02-17 (×2): qty 0.23

## 2024-02-17 MED ORDER — INSULIN ASPART 100 UNIT/ML IJ SOLN
10.0000 [IU] | Freq: Once | INTRAMUSCULAR | Status: AC
  Administered 2024-02-17: 10 [IU] via SUBCUTANEOUS

## 2024-02-17 MED ORDER — IPRATROPIUM-ALBUTEROL 0.5-2.5 (3) MG/3ML IN SOLN
3.0000 mL | Freq: Three times a day (TID) | RESPIRATORY_TRACT | Status: DC
  Administered 2024-02-17 – 2024-02-18 (×3): 3 mL via RESPIRATORY_TRACT
  Filled 2024-02-17 (×3): qty 3

## 2024-02-17 NOTE — Progress Notes (Signed)
 Progress Note    [name]   EAV:[id number]  DOB: 1941-07-14  DOA: 02/15/2024     2 PCP: Benedetto Brady, MD  Initial CC: SOB, cough  Hospital Course: [name] is a 83 y.o. female with medical history significant for chronic HFpEF, history of CVA, asthma, T2DM, HTN, HLD, breast cancer, GAD who is admitted with hypoxia due to bronchitis with asthma exacerbation. Respiratory swab tested positive for parainfluenza virus accounting for her bronchitis and asthma flare.  Interval History:  Respiratory swab tested positive for parainfluenza which explains her exacerbation and bronchitis. Feeling a little bit better today but still requiring nebulizer treatments especially overnight. Remains on oxygen.  Assessment and Plan: * Acute respiratory failure with hypoxia (HCC) - Due to presumed bronchitis with asthma exacerbation - Not on oxygen at home - Continue weaning as able  Infection due to parainfluenza virus 3 - Continue supportive care.  Accounts for viral bronchitis and asthma exacerbation - Overall symptomatically improving - Continue droplet precautions  Acute bronchitis - Recently completed course of antibiotics prior to hospitalization but no significant improvement and found to be hypoxic -Continue with Brovana  and Pulmicort .  Add scheduled DuoNebs - Continue prednisone  - Continue azithromycin  - COVID swab negative - RVP positive for parainfluenza 3  Asthma, chronic, unspecified asthma severity, with acute exacerbation - see bronchitis treatment   Type 2 diabetes mellitus with hyperglycemia (HCC) - A1c 8.3% - Expected hyperglycemia in setting of steroid use - Adding Semglee  - Continue SSI and CBG monitoring; will adjust as necessary  Chronic diastolic CHF (congestive heart failure) (HCC) Appears euvolemic.  Not requiring diuretics at baseline.  Last EF 70-75% by TTE 10/02/22  Hypertension associated with diabetes (HCC) - Continue amlodipine   Mixed  hyperlipidemia due to type 2 diabetes mellitus (HCC) - Continue Lipitor   GERD without esophagitis - Continue Protonix   GAD (generalized anxiety disorder) - Continue Cymbalta   History of stroke - Continue Plavix  and Lipitor    Old records reviewed in assessment of this patient  Antimicrobials: Rocephin  and doxycycline  02/15/2024 x 1 Azithromycin  02/16/2024 >> current  DVT prophylaxis:  enoxaparin  (LOVENOX ) injection 40 mg Start: 02/15/24 1945   Code Status:   Code Status: Full Code  Mobility Assessment (Last 72 Hours)     Mobility Assessment     Row Name 02/17/24 1100 02/16/24 2122 02/16/24 0932 02/15/24 2002 02/15/24 1700   Does patient have an order for bedrest or is patient medically unstable No - Continue assessment No - Continue assessment No - Continue assessment No - Continue assessment No - Continue assessment   What is the highest level of mobility based on the progressive mobility assessment? Level 5 (Walks with assist in room/hall) - Balance while stepping forward/back and can walk in room with assist - Complete Level 5 (Walks with assist in room/hall) - Balance while stepping forward/back and can walk in room with assist - Complete Level 5 (Walks with assist in room/hall) - Balance while stepping forward/back and can walk in room with assist - Complete Level 5 (Walks with assist in room/hall) - Balance while stepping forward/back and can walk in room with assist - Complete Level 5 (Walks with assist in room/hall) - Balance while stepping forward/back and can walk in room with assist - Complete            Barriers to discharge: None Disposition Plan: Home HH orders placed: N/A Status is: Inpatient  Objective: Blood pressure 133/61, pulse 89, temperature 97.8 F (36.6 C), resp. rate  19, height 5\' 5"  (1.651 m), weight 68 kg, SpO2 97%.  Examination:  Physical Exam Constitutional:      Appearance: Normal appearance.  HENT:     Head: Normocephalic and atraumatic.      Mouth/Throat:     Mouth: Mucous membranes are moist.  Eyes:     Extraocular Movements: Extraocular movements intact.  Cardiovascular:     Rate and Rhythm: Normal rate and regular rhythm.  Pulmonary:     Effort: Pulmonary effort is normal. No respiratory distress.     Breath sounds: No wheezing (Now resolved).     Comments: Coarse breath sounds bilaterally (improving) Abdominal:     General: Bowel sounds are normal. There is no distension.     Palpations: Abdomen is soft.     Tenderness: There is no abdominal tenderness.  Musculoskeletal:        General: Normal range of motion.     Cervical back: Normal range of motion and neck supple.  Skin:    General: Skin is warm and dry.  Neurological:     General: No focal deficit present.     Mental Status: She is alert.  Psychiatric:        Mood and Affect: Mood normal.      Consultants:    Procedures:    Data Reviewed: Results for orders placed or performed during the hospital encounter of 02/15/24 (from the past 24 hours)  Respiratory (~20 pathogens) panel by PCR     Status: Abnormal   Collection Time: 02/16/24  3:44 PM   Specimen: Nasopharyngeal Swab; Respiratory  Result Value Ref Range   Adenovirus NOT DETECTED NOT DETECTED   Coronavirus 229E NOT DETECTED NOT DETECTED   Coronavirus HKU1 NOT DETECTED NOT DETECTED   Coronavirus NL63 NOT DETECTED NOT DETECTED   Coronavirus OC43 NOT DETECTED NOT DETECTED   Metapneumovirus NOT DETECTED NOT DETECTED   Rhinovirus / Enterovirus NOT DETECTED NOT DETECTED   Influenza A NOT DETECTED NOT DETECTED   Influenza B NOT DETECTED NOT DETECTED   Parainfluenza Virus 1 NOT DETECTED NOT DETECTED   Parainfluenza Virus 2 NOT DETECTED NOT DETECTED   Parainfluenza Virus 3 DETECTED (A) NOT DETECTED   Parainfluenza Virus 4 NOT DETECTED NOT DETECTED   Respiratory Syncytial Virus NOT DETECTED NOT DETECTED   Bordetella pertussis NOT DETECTED NOT DETECTED   Bordetella Parapertussis NOT DETECTED  NOT DETECTED   Chlamydophila pneumoniae NOT DETECTED NOT DETECTED   Mycoplasma pneumoniae NOT DETECTED NOT DETECTED  Glucose, capillary     Status: Abnormal   Collection Time: 02/16/24  4:39 PM  Result Value Ref Range   Glucose-Capillary 367 (H) 70 - 99 mg/dL  Glucose, capillary     Status: Abnormal   Collection Time: 02/16/24  8:45 PM  Result Value Ref Range   Glucose-Capillary 393 (H) 70 - 99 mg/dL  Glucose, capillary     Status: Abnormal   Collection Time: 02/17/24  8:38 AM  Result Value Ref Range   Glucose-Capillary 332 (H) 70 - 99 mg/dL  Glucose, capillary     Status: Abnormal   Collection Time: 02/17/24  1:28 PM  Result Value Ref Range   Glucose-Capillary 288 (H) 70 - 99 mg/dL    I have reviewed pertinent nursing notes, vitals, labs, and images as necessary. I have ordered labwork to follow up on as indicated.  I have reviewed the last notes from staff over past 24 hours. I have discussed patient's care plan and test results with nursing staff,  CM/SW, and other staff as appropriate.  Time spent: Greater than 50% of the 55 minute visit was spent in counseling/coordination of care for the patient as laid out in the A&P.   LOS: 2 days   Faith Homes, MD [facility] 02/17/2024, 2:27 PM

## 2024-02-17 NOTE — Assessment & Plan Note (Signed)
-   Continue supportive care.  Accounts for viral bronchitis and asthma exacerbation - Overall symptomatically improving - Continue droplet precautions

## 2024-02-17 NOTE — Plan of Care (Signed)

## 2024-02-18 DIAGNOSIS — J45901 Unspecified asthma with (acute) exacerbation: Secondary | ICD-10-CM | POA: Diagnosis not present

## 2024-02-18 DIAGNOSIS — J9601 Acute respiratory failure with hypoxia: Secondary | ICD-10-CM | POA: Diagnosis not present

## 2024-02-18 DIAGNOSIS — J209 Acute bronchitis, unspecified: Secondary | ICD-10-CM | POA: Diagnosis not present

## 2024-02-18 DIAGNOSIS — B348 Other viral infections of unspecified site: Secondary | ICD-10-CM | POA: Diagnosis not present

## 2024-02-18 LAB — GLUCOSE, CAPILLARY: Glucose-Capillary: 198 mg/dL — ABNORMAL HIGH (ref 70–99)

## 2024-02-18 MED ORDER — HYDROCOD POLI-CHLORPHE POLI ER 10-8 MG/5ML PO SUER
5.0000 mL | Freq: Two times a day (BID) | ORAL | 0 refills | Status: DC | PRN
Start: 2024-02-18 — End: 2024-03-17

## 2024-02-18 MED ORDER — IPRATROPIUM-ALBUTEROL 0.5-2.5 (3) MG/3ML IN SOLN: 3.0000 mL | Freq: Four times a day (QID) | RESPIRATORY_TRACT | 1 refills | Status: DC | PRN

## 2024-02-18 NOTE — Plan of Care (Signed)

## 2024-02-18 NOTE — Progress Notes (Signed)
 Patient SPO2 97% st rest on room air, SPO2 94% with ambulation

## 2024-02-18 NOTE — Discharge Summary (Signed)
 Physician Discharge Summary   [name] EAV:[id number] DOB: 1941-01-15 DOA: 02/15/2024  PCP: Benedetto Brady, MD  Admit date: 02/15/2024 Discharge date: 02/18/2024  Admitted From: Home Disposition:  Home Discharging physician: Faith Homes, MD Barriers to discharge: none   Discharge Condition: stable  CODE STATUS: Full  Diet recommendation:  Diet Orders (From admission, onward)     Start     Ordered   02/18/24 0000  Diet Carb Modified        02/18/24 1003   02/15/24 1826  Diet heart healthy/carb modified Fluid consistency: Thin  Diet effective now       Question:  Fluid consistency:  Answer:  Thin   02/15/24 1825            Hospital Course: [name] is a 83 y.o. female with medical history significant for chronic HFpEF, history of CVA, asthma, T2DM, HTN, HLD, breast cancer, GAD who is admitted with hypoxia due to bronchitis with asthma exacerbation. Respiratory swab tested positive for parainfluenza virus accounting for her bronchitis and asthma flare.  Assessment and Plan: * Acute respiratory failure with hypoxia (HCC)-resolved as of 02/18/2024 - Due to presumed bronchitis with asthma exacerbation - Not on oxygen at home - Weaned to room air prior to discharge and ambulated well with no desaturation.  Lowest 94% on room air ambulating  Infection due to parainfluenza virus 3 - Continue supportive care.  Accounts for viral bronchitis and asthma exacerbation - Overall symptomatically improving - Continue droplet precautions  Acute bronchitis - Recently completed course of antibiotics prior to hospitalization but no significant improvement and found to be hypoxic - Treated with nebulizers, steroids, and azithromycin  - COVID swab negative - RVP positive for parainfluenza 3 -Continued on nebulizers only at discharge and further supportive care  Asthma, chronic, unspecified asthma severity, with acute exacerbation - see bronchitis treatment   Type 2 diabetes mellitus  with hyperglycemia (HCC) - A1c 8.3% - Expected hyperglycemia in setting of steroid use - Glucose levels controlled at home - Treated with Semglee  and sliding scale during hospitalization while having hyperglycemia.  Steroid not continued at discharge  Chronic diastolic CHF (congestive heart failure) (HCC) Appears euvolemic.  Not requiring diuretics at baseline.  Last EF 70-75% by TTE 10/02/22  Hypertension associated with diabetes (HCC) - Continue amlodipine   Mixed hyperlipidemia due to type 2 diabetes mellitus (HCC) - Continue Lipitor   GERD without esophagitis - Continue Protonix   GAD (generalized anxiety disorder) - Continue Cymbalta   History of stroke - Continue Plavix  and Lipitor    The patient's acute and chronic medical conditions were treated accordingly. On day of discharge, patient was felt deemed stable for discharge. Patient/family member advised to call PCP or come back to ER if needed.   Principal Diagnosis: Acute respiratory failure with hypoxia [facility])  Discharge Diagnoses: Active Hospital Problems   Diagnosis Date Noted   Infection due to parainfluenza virus 3 02/17/2024    Priority: 1.   Acute bronchitis 02/15/2024    Priority: 1.   Asthma, chronic, unspecified asthma severity, with acute exacerbation 05/25/2021    Priority: 2.   Type 2 diabetes mellitus with hyperglycemia (HCC) 10/01/2022    Priority: 3.   Chronic diastolic CHF (congestive heart failure) (HCC) 10/02/2022    Priority: 4.   Hypertension associated with diabetes (HCC) 03/03/2014    Priority: 6.   Mixed hyperlipidemia due to type 2 diabetes mellitus (HCC) 10/02/2022    Priority: 9.   GERD without esophagitis 03/24/2008  Priority: 10.   GAD (generalized anxiety disorder) 02/16/2024   History of stroke 06/13/2017    Resolved Hospital Problems   Diagnosis Date Noted Date Resolved   Acute respiratory failure with hypoxia (HCC) 10/11/2022 02/18/2024    Priority: 1.     Discharge  Instructions     Diet Carb Modified   Complete by: As directed    Increase activity slowly   Complete by: As directed       Allergies as of 02/18/2024       Reactions   Oxycodone -acetaminophen  Nausea And Vomiting   Ciprofloxacin  Nausea And Vomiting   Other reaction(s): Other (See Comments)   Codeine Nausea And Vomiting   Doxycycline  Nausea And Vomiting   REACTION: GI upset   Percocet [oxycodone -acetaminophen ] Nausea And Vomiting   Tramadol  Nausea And Vomiting   Other reaction(s): Other (See Comments)        Medication List     STOP taking these medications    guaiFENesin  100 MG/5ML liquid Commonly known as: ROBITUSSIN   promethazine -dextromethorphan 6.25-15 MG/5ML syrup Commonly known as: PROMETHAZINE -DM       TAKE these medications    acetaminophen  500 MG tablet Commonly known as: TYLENOL  Take 500 mg by mouth every 6 (six) hours as needed. As needed for pain   albuterol  108 (90 Base) MCG/ACT inhaler Commonly known as: VENTOLIN  HFA Inhale 2 puffs into the lungs every 6 (six) hours as needed.   amLODipine  5 MG tablet Commonly known as: NORVASC  Take 5 mg by mouth daily.   atorvastatin  80 MG tablet Commonly known as: LIPITOR  Take 1 tablet (80 mg total) by mouth daily at 6 PM.   chlorpheniramine-HYDROcodone 10-8 MG/5ML Commonly known as: TUSSIONEX Take 5 mLs by mouth every 12 (twelve) hours as needed for cough.   clopidogrel  75 MG tablet Commonly known as: PLAVIX  Take 1 tablet (75 mg total) by mouth daily.   DULoxetine  30 MG capsule Commonly known as: CYMBALTA  Take 30 mg by mouth daily.   ipratropium 0.03 % nasal spray Commonly known as: ATROVENT  Place 2 sprays into both nostrils 2 (two) times daily.   ipratropium-albuterol  0.5-2.5 (3) MG/3ML Soln Commonly known as: DUONEB Take 3 mLs by nebulization every 6 (six) hours as needed.   Jardiance  10 MG Tabs tablet Generic drug: empagliflozin  Take 10 mg by mouth daily.   montelukast  10 MG  tablet Commonly known as: SINGULAIR  Take 1 tablet (10 mg total) by mouth at bedtime.   pantoprazole  40 MG tablet Commonly known as: PROTONIX  Take 30-60 min before first meal of the day        Allergies  Allergen Reactions   Oxycodone -Acetaminophen  Nausea And Vomiting   Ciprofloxacin  Nausea And Vomiting    Other reaction(s): Other (See Comments)   Codeine Nausea And Vomiting   Doxycycline  Nausea And Vomiting    REACTION: GI upset   Percocet [Oxycodone -Acetaminophen ] Nausea And Vomiting   Tramadol  Nausea And Vomiting    Other reaction(s): Other (See Comments)    Consultations:   Procedures:   Discharge Exam: BP (!) 160/65 (BP Location: Left Arm)   Pulse 82   Temp 97.8 F (36.6 C)   Resp 19   Ht 5\' 5"  (1.651 m)   Wt 68 kg   SpO2 95%   BMI 24.96 kg/m  Physical Exam Constitutional:      Appearance: Normal appearance.  HENT:     Head: Normocephalic and atraumatic.     Mouth/Throat:     Mouth: Mucous membranes are moist.  Eyes:     Extraocular Movements: Extraocular movements intact.  Cardiovascular:     Rate and Rhythm: Normal rate and regular rhythm.  Pulmonary:     Effort: Pulmonary effort is normal. No respiratory distress.     Breath sounds: No wheezing (Now resolved).     Comments: Coarse breath sounds bilaterally (improving) Abdominal:     General: Bowel sounds are normal. There is no distension.     Palpations: Abdomen is soft.     Tenderness: There is no abdominal tenderness.  Musculoskeletal:        General: Normal range of motion.     Cervical back: Normal range of motion and neck supple.  Skin:    General: Skin is warm and dry.  Neurological:     General: No focal deficit present.     Mental Status: She is alert.  Psychiatric:        Mood and Affect: Mood normal.      The results of significant diagnostics from this hospitalization (including imaging, microbiology, ancillary and laboratory) are listed below for reference.    Microbiology: Recent Results (from the past 240 hours)  Resp panel by RT-PCR (RSV, Flu A&B, Covid) Anterior Nasal Swab     Status: None   Collection Time: 02/15/24 11:25 AM   Specimen: Anterior Nasal Swab  Result Value Ref Range Status   SARS Coronavirus 2 by RT PCR NEGATIVE NEGATIVE Final    Comment: (NOTE) SARS-CoV-2 target nucleic acids are NOT DETECTED.  The SARS-CoV-2 RNA is generally detectable in upper respiratory specimens during the acute phase of infection. The lowest concentration of SARS-CoV-2 viral copies this assay can detect is 138 copies/mL. A negative result does not preclude SARS-Cov-2 infection and should not be used as the sole basis for treatment or other patient management decisions. A negative result may occur with  improper specimen collection/handling, submission of specimen other than nasopharyngeal swab, presence of viral mutation(s) within the areas targeted by this assay, and inadequate number of viral copies(<138 copies/mL). A negative result must be combined with clinical observations, patient history, and epidemiological information. The expected result is Negative.  Fact Sheet for Patients:  BloggerCourse.com  Fact Sheet for Healthcare Providers:  SeriousBroker.it  This test is no t yet approved or cleared by the [place]  FDA and  has been authorized for detection and/or diagnosis of SARS-CoV-2 by FDA under an Emergency Use Authorization (EUA). This EUA will remain  in effect (meaning this test can be used) for the duration of the COVID-19 declaration under Section 564(b)(1) of the Act, 21 U.S.C.section 360bbb-3(b)(1), unless the authorization is terminated  or revoked sooner.       Influenza A by PCR NEGATIVE NEGATIVE Final   Influenza B by PCR NEGATIVE NEGATIVE Final    Comment: (NOTE) The Xpert Xpress SARS-CoV-2/FLU/RSV plus assay is intended as an aid in the diagnosis of influenza  from Nasopharyngeal swab specimens and should not be used as a sole basis for treatment. Nasal washings and aspirates are unacceptable for Xpert Xpress SARS-CoV-2/FLU/RSV testing.  Fact Sheet for Patients: BloggerCourse.com  Fact Sheet for Healthcare Providers: SeriousBroker.it  This test is not yet approved or cleared by the [place]  FDA and has been authorized for detection and/or diagnosis of SARS-CoV-2 by FDA under an Emergency Use Authorization (EUA). This EUA will remain in effect (meaning this test can be used) for the duration of the COVID-19 declaration under Section 564(b)(1) of the Act, 21 U.S.C. section 360bbb-3(b)(1), unless the authorization  is terminated or revoked.     Resp Syncytial Virus by PCR NEGATIVE NEGATIVE Final    Comment: (NOTE) Fact Sheet for Patients: BloggerCourse.com  Fact Sheet for Healthcare Providers: SeriousBroker.it  This test is not yet approved or cleared by the [place]  FDA and has been authorized for detection and/or diagnosis of SARS-CoV-2 by FDA under an Emergency Use Authorization (EUA). This EUA will remain in effect (meaning this test can be used) for the duration of the COVID-19 declaration under Section 564(b)(1) of the Act, 21 U.S.C. section 360bbb-3(b)(1), unless the authorization is terminated or revoked.  Performed at [facility], [address] [place], [place] [address]   Group A Strep by PCR     Status: None   Collection Time: 02/15/24 12:40 PM   Specimen: Throat; Sterile Swab  Result Value Ref Range Status   Group A Strep by PCR NOT DETECTED NOT DETECTED Final    Comment: Performed at [facility], [address] [place], [place] [address]  Respiratory (~20 pathogens) panel by PCR     Status: Abnormal   Collection Time: 02/16/24  3:44 PM   Specimen: Nasopharyngeal  Swab; Respiratory  Result Value Ref Range Status   Adenovirus NOT DETECTED NOT DETECTED Final   Coronavirus 229E NOT DETECTED NOT DETECTED Final    Comment: (NOTE) The Coronavirus on the Respiratory Panel, DOES NOT test for the novel  Coronavirus (2019 nCoV)    Coronavirus HKU1 NOT DETECTED NOT DETECTED Final   Coronavirus NL63 NOT DETECTED NOT DETECTED Final   Coronavirus OC43 NOT DETECTED NOT DETECTED Final   Metapneumovirus NOT DETECTED NOT DETECTED Final   Rhinovirus / Enterovirus NOT DETECTED NOT DETECTED Final   Influenza A NOT DETECTED NOT DETECTED Final   Influenza B NOT DETECTED NOT DETECTED Final   Parainfluenza Virus 1 NOT DETECTED NOT DETECTED Final   Parainfluenza Virus 2 NOT DETECTED NOT DETECTED Final   Parainfluenza Virus 3 DETECTED (A) NOT DETECTED Final   Parainfluenza Virus 4 NOT DETECTED NOT DETECTED Final   Respiratory Syncytial Virus NOT DETECTED NOT DETECTED Final   Bordetella pertussis NOT DETECTED NOT DETECTED Final   Bordetella Parapertussis NOT DETECTED NOT DETECTED Final   Chlamydophila pneumoniae NOT DETECTED NOT DETECTED Final   Mycoplasma pneumoniae NOT DETECTED NOT DETECTED Final    Comment: Performed at [facility], [address] [place], [place] [address]     Labs: BNP (last 3 results) No results for input(s): "BNP" in the last 8760 hours. Basic Metabolic Panel: Recent Labs  Lab 02/15/24 1125 02/16/24 0312  NA 137 137  K 4.2 4.6  CL 99 103  CO2 24 24  GLUCOSE 286* 333*  BUN 16 20  CREATININE 0.87 1.02*  CALCIUM  9.9 9.1   Liver Function Tests: No results for input(s): "AST", "ALT", "ALKPHOS", "BILITOT", "PROT", "ALBUMIN " in the last 168 hours. No results for input(s): "LIPASE", "AMYLASE" in the last 168 hours. No results for input(s): "AMMONIA" in the last 168 hours. CBC: Recent Labs  Lab 02/15/24 1125 02/16/24 0312  WBC 11.5* 10.7*  HGB 13.8 13.8  HCT 39.9 40.9  MCV 88.3 88.3  PLT 243 231   Cardiac Enzymes: No  results for input(s): "CKTOTAL", "CKMB", "CKMBINDEX", "TROPONINI" in the last 168 hours. BNP: Invalid input(s): "POCBNP" CBG: Recent Labs  Lab 02/17/24 1328 02/17/24 1647 02/17/24 2057 02/17/24 2059 02/18/24 0746  GLUCAP 288* 355* 463* 433* 198*   D-Dimer No results for input(s): "DDIMER" in the last  72 hours. Hgb A1c Recent Labs    02/15/24 1817  HGBA1C 8.3*   Lipid Profile No results for input(s): "CHOL", "HDL", "LDLCALC", "TRIG", "CHOLHDL", "LDLDIRECT" in the last 72 hours. Thyroid  function studies No results for input(s): "TSH", "T4TOTAL", "T3FREE", "THYROIDAB" in the last 72 hours.  Invalid input(s): "FREET3" Anemia work up No results for input(s): "VITAMINB12", "FOLATE", "FERRITIN", "TIBC", "IRON", "RETICCTPCT" in the last 72 hours. Urinalysis    Component Value Date/Time   COLORURINE ORANGE (A) 01/22/2023 1927   APPEARANCEUR HAZY (A) 01/22/2023 1927   LABSPEC 1.016 01/22/2023 1927   PHURINE 5.5 01/22/2023 1927   GLUCOSEU NEGATIVE 01/22/2023 1927   HGBUR LARGE (A) 01/22/2023 1927   BILIRUBINUR SMALL (A) 01/22/2023 1927   KETONESUR NEGATIVE 01/22/2023 1927   PROTEINUR 30 (A) 01/22/2023 1927   UROBILINOGEN 0.2 06/30/2014 1930   NITRITE POSITIVE (A) 01/22/2023 1927   LEUKOCYTESUR LARGE (A) 01/22/2023 1927   Sepsis Labs Recent Labs  Lab 02/15/24 1125 02/16/24 0312  WBC 11.5* 10.7*   Microbiology Recent Results (from the past 240 hours)  Resp panel by RT-PCR (RSV, Flu A&B, Covid) Anterior Nasal Swab     Status: None   Collection Time: 02/15/24 11:25 AM   Specimen: Anterior Nasal Swab  Result Value Ref Range Status   SARS Coronavirus 2 by RT PCR NEGATIVE NEGATIVE Final    Comment: (NOTE) SARS-CoV-2 target nucleic acids are NOT DETECTED.  The SARS-CoV-2 RNA is generally detectable in upper respiratory specimens during the acute phase of infection. The lowest concentration of SARS-CoV-2 viral copies this assay can detect is 138 copies/mL. A negative  result does not preclude SARS-Cov-2 infection and should not be used as the sole basis for treatment or other patient management decisions. A negative result may occur with  improper specimen collection/handling, submission of specimen other than nasopharyngeal swab, presence of viral mutation(s) within the areas targeted by this assay, and inadequate number of viral copies(<138 copies/mL). A negative result must be combined with clinical observations, patient history, and epidemiological information. The expected result is Negative.  Fact Sheet for Patients:  BloggerCourse.com  Fact Sheet for Healthcare Providers:  SeriousBroker.it  This test is no t yet approved or cleared by the [place]  FDA and  has been authorized for detection and/or diagnosis of SARS-CoV-2 by FDA under an Emergency Use Authorization (EUA). This EUA will remain  in effect (meaning this test can be used) for the duration of the COVID-19 declaration under Section 564(b)(1) of the Act, 21 U.S.C.section 360bbb-3(b)(1), unless the authorization is terminated  or revoked sooner.       Influenza A by PCR NEGATIVE NEGATIVE Final   Influenza B by PCR NEGATIVE NEGATIVE Final    Comment: (NOTE) The Xpert Xpress SARS-CoV-2/FLU/RSV plus assay is intended as an aid in the diagnosis of influenza from Nasopharyngeal swab specimens and should not be used as a sole basis for treatment. Nasal washings and aspirates are unacceptable for Xpert Xpress SARS-CoV-2/FLU/RSV testing.  Fact Sheet for Patients: BloggerCourse.com  Fact Sheet for Healthcare Providers: SeriousBroker.it  This test is not yet approved or cleared by the [place]  FDA and has been authorized for detection and/or diagnosis of SARS-CoV-2 by FDA under an Emergency Use Authorization (EUA). This EUA will remain in effect (meaning this test can be used)  for the duration of the COVID-19 declaration under Section 564(b)(1) of the Act, 21 U.S.C. section 360bbb-3(b)(1), unless the authorization is terminated or revoked.     Resp Syncytial Virus by PCR  NEGATIVE NEGATIVE Final    Comment: (NOTE) Fact Sheet for Patients: BloggerCourse.com  Fact Sheet for Healthcare Providers: SeriousBroker.it  This test is not yet approved or cleared by the [place]  FDA and has been authorized for detection and/or diagnosis of SARS-CoV-2 by FDA under an Emergency Use Authorization (EUA). This EUA will remain in effect (meaning this test can be used) for the duration of the COVID-19 declaration under Section 564(b)(1) of the Act, 21 U.S.C. section 360bbb-3(b)(1), unless the authorization is terminated or revoked.  Performed at [facility], [address], [place], [place] [address]   Group A Strep by PCR     Status: None   Collection Time: 02/15/24 12:40 PM   Specimen: Throat; Sterile Swab  Result Value Ref Range Status   Group A Strep by PCR NOT DETECTED NOT DETECTED Final    Comment: Performed at [facility], [address] [place], [place] [address]  Respiratory (~20 pathogens) panel by PCR     Status: Abnormal   Collection Time: 02/16/24  3:44 PM   Specimen: Nasopharyngeal Swab; Respiratory  Result Value Ref Range Status   Adenovirus NOT DETECTED NOT DETECTED Final   Coronavirus 229E NOT DETECTED NOT DETECTED Final    Comment: (NOTE) The Coronavirus on the Respiratory Panel, DOES NOT test for the novel  Coronavirus (2019 nCoV)    Coronavirus HKU1 NOT DETECTED NOT DETECTED Final   Coronavirus NL63 NOT DETECTED NOT DETECTED Final   Coronavirus OC43 NOT DETECTED NOT DETECTED Final   Metapneumovirus NOT DETECTED NOT DETECTED Final   Rhinovirus / Enterovirus NOT DETECTED NOT DETECTED Final   Influenza A NOT DETECTED NOT DETECTED Final   Influenza B  NOT DETECTED NOT DETECTED Final   Parainfluenza Virus 1 NOT DETECTED NOT DETECTED Final   Parainfluenza Virus 2 NOT DETECTED NOT DETECTED Final   Parainfluenza Virus 3 DETECTED (A) NOT DETECTED Final   Parainfluenza Virus 4 NOT DETECTED NOT DETECTED Final   Respiratory Syncytial Virus NOT DETECTED NOT DETECTED Final   Bordetella pertussis NOT DETECTED NOT DETECTED Final   Bordetella Parapertussis NOT DETECTED NOT DETECTED Final   Chlamydophila pneumoniae NOT DETECTED NOT DETECTED Final   Mycoplasma pneumoniae NOT DETECTED NOT DETECTED Final    Comment: Performed at [facility], [address] [place], [place] [address]    Procedures/Studies: CT Angio Chest PE W and/or Wo Contrast Result Date: 02/15/2024 CLINICAL DATA:  Pulmonary embolism (PE) suspected, high prob EXAM: CT ANGIOGRAPHY CHEST WITH CONTRAST TECHNIQUE: Multidetector CT imaging of the chest was performed using the standard protocol during bolus administration of intravenous contrast. Multiplanar CT image reconstructions and MIPs were obtained to evaluate the vascular anatomy. RADIATION DOSE REDUCTION: This exam was performed according to the departmental dose-optimization program which includes automated exposure control, adjustment of the mA and/or kV according to patient size and/or use of iterative reconstruction technique. CONTRAST:  80mL OMNIPAQUE  IOHEXOL  350 MG/ML SOLN COMPARISON:  September 10, 2023 FINDINGS: Pulmonary Embolism: No pulmonary embolism. Cardiovascular: No cardiomegaly or pericardial effusion. No aortic aneurysm. Dense multi-vessel coronary atherosclerosis. Scattered calcified aortic atherosclerosis. Mediastinum/Nodes: No mediastinal mass. No mediastinal, hilar, or axillary lymphadenopathy. Right axillary lymph node dissection changes. Lungs/Pleura: The midline trachea is collapsed, likely due to expiratory phase imaging. Mild diffuse bronchial wall thickening. Extensive mosaic attenuation throughout the lungs.  Reticulation in the lung bases, possibly reflecting atelectasis. No focal airspace consolidation, pleural effusion, or pneumothorax. Musculoskeletal: No acute fracture or destructive bone lesion. Osteopenia. Thoracic DISH. Right  breast implant. Upper Abdomen: Cholecystectomy. No acute abnormality in the partially visualized upper abdomen. Review of the MIP images confirms the above findings. IMPRESSION: 1. No pulmonary embolism. 2. Mild diffuse bronchial wall thickening with extensive mosaic attenuation throughout the lungs, worrisome for acute or chronic bronchitis with superimposed chronic small airways disease. No lobar pneumonia or pulmonary edema. No pleural effusion. Aortic Atherosclerosis (ICD10-I70.0). Electronically Signed   By: Rance Burrows M.D.   On: 02/15/2024 13:48   DG Chest 2 View Result Date: 02/15/2024 CLINICAL DATA:  Shortness of breath. EXAM: CHEST - 2 VIEW COMPARISON:  June 08, 2023. FINDINGS: The heart size and mediastinal contours are within normal limits. Both lungs are clear. The visualized skeletal structures are unremarkable. IMPRESSION: No active cardiopulmonary disease. Electronically Signed   By: Rosalene Colon M.D.   On: 02/15/2024 11:39     Time coordinating discharge: Over 30 minutes    Faith Homes, MD  [facility] 02/18/2024, 1:39 PM

## 2024-02-28 DIAGNOSIS — M533 Sacrococcygeal disorders, not elsewhere classified: Secondary | ICD-10-CM | POA: Diagnosis not present

## 2024-03-01 DIAGNOSIS — J9601 Acute respiratory failure with hypoxia: Secondary | ICD-10-CM | POA: Diagnosis not present

## 2024-03-01 DIAGNOSIS — E1122 Type 2 diabetes mellitus with diabetic chronic kidney disease: Secondary | ICD-10-CM | POA: Diagnosis not present

## 2024-03-01 DIAGNOSIS — J40 Bronchitis, not specified as acute or chronic: Secondary | ICD-10-CM | POA: Diagnosis not present

## 2024-03-01 DIAGNOSIS — J45901 Unspecified asthma with (acute) exacerbation: Secondary | ICD-10-CM | POA: Diagnosis not present

## 2024-03-06 DIAGNOSIS — M1612 Unilateral primary osteoarthritis, left hip: Secondary | ICD-10-CM | POA: Diagnosis not present

## 2024-03-14 DIAGNOSIS — R531 Weakness: Secondary | ICD-10-CM | POA: Diagnosis not present

## 2024-03-14 DIAGNOSIS — R0989 Other specified symptoms and signs involving the circulatory and respiratory systems: Secondary | ICD-10-CM | POA: Diagnosis not present

## 2024-03-14 DIAGNOSIS — T148XXA Other injury of unspecified body region, initial encounter: Secondary | ICD-10-CM | POA: Diagnosis not present

## 2024-03-14 DIAGNOSIS — S40811A Abrasion of right upper arm, initial encounter: Secondary | ICD-10-CM | POA: Diagnosis not present

## 2024-03-14 DIAGNOSIS — R0609 Other forms of dyspnea: Secondary | ICD-10-CM | POA: Diagnosis not present

## 2024-03-16 ENCOUNTER — Emergency Department (HOSPITAL_BASED_OUTPATIENT_CLINIC_OR_DEPARTMENT_OTHER)

## 2024-03-16 ENCOUNTER — Other Ambulatory Visit: Payer: Self-pay

## 2024-03-16 ENCOUNTER — Encounter (HOSPITAL_BASED_OUTPATIENT_CLINIC_OR_DEPARTMENT_OTHER): Payer: Self-pay | Admitting: Emergency Medicine

## 2024-03-16 ENCOUNTER — Emergency Department (HOSPITAL_BASED_OUTPATIENT_CLINIC_OR_DEPARTMENT_OTHER): Admitting: Radiology

## 2024-03-16 ENCOUNTER — Observation Stay (HOSPITAL_BASED_OUTPATIENT_CLINIC_OR_DEPARTMENT_OTHER)
Admission: EM | Admit: 2024-03-16 | Discharge: 2024-03-17 | Disposition: A | Discharge status: Home / Self Care | Attending: Emergency Medicine | Admitting: Emergency Medicine

## 2024-03-16 DIAGNOSIS — C50919 Malignant neoplasm of unspecified site of unspecified female breast: Secondary | ICD-10-CM | POA: Diagnosis present

## 2024-03-16 DIAGNOSIS — I5032 Chronic diastolic (congestive) heart failure: Secondary | ICD-10-CM | POA: Insufficient documentation

## 2024-03-16 DIAGNOSIS — R059 Cough, unspecified: Secondary | ICD-10-CM | POA: Diagnosis not present

## 2024-03-16 DIAGNOSIS — N390 Urinary tract infection, site not specified: Secondary | ICD-10-CM | POA: Diagnosis not present

## 2024-03-16 DIAGNOSIS — I110 Hypertensive heart disease with heart failure: Secondary | ICD-10-CM | POA: Insufficient documentation

## 2024-03-16 DIAGNOSIS — E785 Hyperlipidemia, unspecified: Secondary | ICD-10-CM | POA: Insufficient documentation

## 2024-03-16 DIAGNOSIS — R29818 Other symptoms and signs involving the nervous system: Secondary | ICD-10-CM | POA: Diagnosis not present

## 2024-03-16 DIAGNOSIS — E1165 Type 2 diabetes mellitus with hyperglycemia: Secondary | ICD-10-CM | POA: Insufficient documentation

## 2024-03-16 DIAGNOSIS — M6281 Muscle weakness (generalized): Principal | ICD-10-CM | POA: Insufficient documentation

## 2024-03-16 DIAGNOSIS — R270 Ataxia, unspecified: Secondary | ICD-10-CM | POA: Diagnosis not present

## 2024-03-16 DIAGNOSIS — R42 Dizziness and giddiness: Secondary | ICD-10-CM

## 2024-03-16 DIAGNOSIS — Z8673 Personal history of transient ischemic attack (TIA), and cerebral infarction without residual deficits: Secondary | ICD-10-CM

## 2024-03-16 DIAGNOSIS — K219 Gastro-esophageal reflux disease without esophagitis: Secondary | ICD-10-CM | POA: Insufficient documentation

## 2024-03-16 DIAGNOSIS — R29898 Other symptoms and signs involving the musculoskeletal system: Secondary | ICD-10-CM | POA: Diagnosis present

## 2024-03-16 DIAGNOSIS — I700 Atherosclerosis of aorta: Secondary | ICD-10-CM | POA: Diagnosis not present

## 2024-03-16 DIAGNOSIS — C50911 Malignant neoplasm of unspecified site of right female breast: Secondary | ICD-10-CM | POA: Diagnosis not present

## 2024-03-16 DIAGNOSIS — J45909 Unspecified asthma, uncomplicated: Secondary | ICD-10-CM | POA: Diagnosis present

## 2024-03-16 DIAGNOSIS — I6782 Cerebral ischemia: Secondary | ICD-10-CM | POA: Diagnosis not present

## 2024-03-16 DIAGNOSIS — R0602 Shortness of breath: Secondary | ICD-10-CM | POA: Diagnosis not present

## 2024-03-16 DIAGNOSIS — R296 Repeated falls: Secondary | ICD-10-CM | POA: Insufficient documentation

## 2024-03-16 DIAGNOSIS — R531 Weakness: Secondary | ICD-10-CM | POA: Diagnosis not present

## 2024-03-16 LAB — COMPREHENSIVE METABOLIC PANEL WITH GFR
ALT: 26 U/L (ref 0–44)
AST: 13 U/L — ABNORMAL LOW (ref 15–41)
Albumin: 4.2 g/dL (ref 3.5–5.0)
Alkaline Phosphatase: 93 U/L (ref 38–126)
Anion gap: 11 (ref 5–15)
BUN: 22 mg/dL (ref 8–23)
CO2: 24 mmol/L (ref 22–32)
Calcium: 10 mg/dL (ref 8.9–10.3)
Chloride: 100 mmol/L (ref 98–111)
Creatinine, Ser: 0.95 mg/dL (ref 0.44–1.00)
GFR, Estimated: 59 mL/min — ABNORMAL LOW (ref >60)
Glucose, Bld: 431 mg/dL — ABNORMAL HIGH (ref 70–99)
Potassium: 4.2 mmol/L (ref 3.5–5.1)
Sodium: 135 mmol/L (ref 135–145)
Total Bilirubin: 0.7 mg/dL (ref 0.0–1.2)
Total Protein: 6.9 g/dL (ref 6.5–8.1)

## 2024-03-16 LAB — CBC
HCT: 41.8 % (ref 36.0–46.0)
Hemoglobin: 14.6 g/dL (ref 12.0–15.0)
MCH: 31.1 pg (ref 26.0–34.0)
MCHC: 34.9 g/dL (ref 30.0–36.0)
MCV: 88.9 fL (ref 80.0–100.0)
Platelets: 241 K/uL (ref 150–400)
RBC: 4.7 MIL/uL (ref 3.87–5.11)
RDW: 13.7 % (ref 11.5–15.5)
WBC: 8.2 K/uL (ref 4.0–10.5)
nRBC: 0 % (ref 0.0–0.2)

## 2024-03-16 LAB — URINALYSIS, ROUTINE W REFLEX MICROSCOPIC
Bacteria, UA: NONE SEEN
Bilirubin Urine: NEGATIVE
Glucose, UA: >1000 mg/dL — AB
Hgb urine dipstick: NEGATIVE
Ketones, ur: NEGATIVE mg/dL
Nitrite: POSITIVE — AB
Protein, ur: NEGATIVE mg/dL
Specific Gravity, Urine: 1.04 — ABNORMAL HIGH (ref 1.005–1.030)
pH: 5.5 (ref 5.0–8.0)

## 2024-03-16 LAB — CBG MONITORING, ED: Glucose-Capillary: 388 mg/dL — ABNORMAL HIGH (ref 70–99)

## 2024-03-16 LAB — CK: Total CK: 25 U/L — ABNORMAL LOW (ref 38–234)

## 2024-03-16 LAB — SEDIMENTATION RATE: Sed Rate: 18 mm/h (ref 0–22)

## 2024-03-16 LAB — C-REACTIVE PROTEIN: CRP: <0.5 mg/dL (ref <1.0)

## 2024-03-16 LAB — PRO BRAIN NATRIURETIC PEPTIDE: Pro Brain Natriuretic Peptide: 261 pg/mL (ref <300.0)

## 2024-03-16 NOTE — ED Triage Notes (Signed)
 Pt was taken off Jardiance  a month ago, due to telling her primary that she has been tired and legs aching, hasn't improved

## 2024-03-16 NOTE — ED Provider Notes (Signed)
 [facility] AT [facility] Provider Note   CSN: [id number] Arrival date & time: 03/16/24  1133     Patient presents with: No chief complaint on file.   [name] is a 83 y.o. female who was discharged on 02/18/2023 after being hospitalized for hypoxic respiratory failure in the setting of parainfluenza virus.  Patient reports that after she was discharged she began having significant aching and pain in her lower extremities.  She states that she also began having problems with her balance after being admitted and has had progressively worsening weakness in her legs ability to ambulate or complete ADLs and states that she has fallen multiple times even more so over the last few weeks.  She reports that she had shortness of breath prior to her admission and that been going on for a long time but it seems to be getting worse although she denies orthopnea, point PND or swelling in her abdomen or legs.  She does report that her shortness of breath when she does try to ambulate is much worse.  Although the patient states I just cannot walk.  She reports that she can get around with some help but her husband says she takes tiny little steps and she frequently falls down if she is not holding onto something.  He states I am terrified for her to go down the stairs.  Patient is on Lipitor  and does complain of severe pain in her legs.  She denies numbness tingling or other signs or symptoms of neuropathy.  She denies any new bladder incontinence or saddle anesthesia.  She followed up with her primary care physician who attributed her symptoms to Jardiance  and removed that from her medications.  She reports that since that time her sugars have been high and she has been urinating a lot.  She has normal appetite.  She denies fevers or unexplained weight loss.  She denies urinary symptoms. Patient also reports intense pain around her waist that comes and goes.   {Add pertinent  medical, surgical, social history, OB history to HPI:32947} HPI     Prior to Admission medications   Medication Sig Start Date End Date Taking? Authorizing Provider  acetaminophen  (TYLENOL ) 500 MG tablet Take 500 mg by mouth every 6 (six) hours as needed. As needed for pain    [provider]  albuterol  (VENTOLIN  HFA) 108 (90 Base) MCG/ACT inhaler Inhale 2 puffs into the lungs every 6 (six) hours as needed. 02/08/24   Leath-Warren, Etta PARAS, NP  amLODipine  (NORVASC ) 5 MG tablet Take 5 mg by mouth daily.    [provider]  atorvastatin  (LIPITOR ) 80 MG tablet Take 1 tablet (80 mg total) by mouth daily at 6 PM. 07/06/15   Jerri Pfeiffer, MD  chlorpheniramine-HYDROcodone (TUSSIONEX) 10-8 MG/5ML Take 5 mLs by mouth every 12 (twelve) hours as needed for cough. 02/18/24   Patsy Lenis, MD  clopidogrel  (PLAVIX ) 75 MG tablet Take 1 tablet (75 mg total) by mouth daily. 06/01/17   Jerri Pfeiffer, MD  DULoxetine  (CYMBALTA ) 30 MG capsule Take 30 mg by mouth daily. 05/05/21   [provider]  ipratropium (ATROVENT ) 0.03 % nasal spray Place 2 sprays into both nostrils 2 (two) times daily. 02/09/24   [provider]  ipratropium-albuterol  (DUONEB) 0.5-2.5 (3) MG/3ML SOLN Take 3 mLs by nebulization every 6 (six) hours as needed. 02/18/24   Patsy Lenis, MD  JARDIANCE  10 MG TABS tablet Take 10 mg by mouth daily. 03/03/23  [provider]  montelukast  (SINGULAIR ) 10 MG tablet Take 1 tablet (10 mg total) by mouth at bedtime. 05/29/21   Singh, Prashant K, MD  pantoprazole  (PROTONIX ) 40 MG tablet Take 30-60 min before first meal of the day 12/23/22   Darlean Ozell NOVAK, MD    Allergies: Oxycodone -acetaminophen , Ciprofloxacin , Codeine, Doxycycline , Percocet [oxycodone -acetaminophen ], and Tramadol     Review of Systems  Updated Vital Signs BP (!) 184/78 (BP Location: Right Arm)   Pulse 99   Temp 97.9 F (36.6 C) (Oral)   Resp (!) 22   SpO2 94%   Physical Exam Vitals and  nursing note reviewed.  Constitutional:      General: She is not in acute distress.    Appearance: She is well-developed. She is not diaphoretic.  HENT:     Head: Normocephalic and atraumatic.     Right Ear: External ear normal.     Left Ear: External ear normal.     Nose: Nose normal.     Mouth/Throat:     Mouth: Mucous membranes are moist.  Eyes:     General: No scleral icterus.    Conjunctiva/sclera: Conjunctivae normal.  Cardiovascular:     Rate and Rhythm: Normal rate and regular rhythm.     Heart sounds: Normal heart sounds. No murmur heard.    No friction rub. No gallop.  Pulmonary:     Effort: Pulmonary effort is normal. No respiratory distress.     Breath sounds: Normal breath sounds.  Abdominal:     General: Bowel sounds are normal. There is no distension.     Palpations: Abdomen is soft. There is no mass.     Tenderness: There is no abdominal tenderness. There is no guarding.  Musculoskeletal:     Cervical back: Normal range of motion.  Skin:    General: Skin is warm and dry.  Neurological:     Mental Status: She is alert and oriented to person, place, and time.     Comments: 5/5 strength with Dorsi and plantar flexion at the ankle Normal strength with flexion and extension of the knee. 4/5 str  Psychiatric:        Behavior: Behavior normal.     (all labs ordered are listed, but only abnormal results are displayed) Labs Reviewed  COMPREHENSIVE METABOLIC PANEL WITH GFR - Abnormal; Notable for the following components:      Result Value   Glucose, Bld 431 (*)    AST 13 (*)    GFR, Estimated 59 (*)    All other components within normal limits  CBG MONITORING, ED - Abnormal; Notable for the following components:   Glucose-Capillary 388 (*)    All other components within normal limits  CBC  URINALYSIS, ROUTINE W REFLEX MICROSCOPIC    EKG: EKG Interpretation Date/Time:  Saturday March 16 2024 11:42:52 EDT Ventricular Rate:  91 PR Interval:  169 QRS  Duration:  76 QT Interval:  372 QTC Calculation: 458 R Axis:   -17  Text Interpretation: Sinus rhythm Left ventricular hypertrophy Anterior infarct, old No significant change since last tracing Confirmed by Randol Simmonds [phone]) on 03/16/2024 11:54:11 AM  Radiology: No results found.  {Document cardiac monitor, telemetry assessment procedure when appropriate:32947} Procedures   Medications Ordered in the ED - No data to display  Clinical Course as of 03/16/24 1939  Sat Mar 16, 2024  1518 Comprehensive metabolic panel(!) [AH]  1518 CK(!) [AH]  1518 CBC [AH]  1518 Bacteria, UA: NONE SEEN [AH]  1616  Case discussed with Dr. Delon Herald- she does not feel the patient meets inpatient criteria. Will chat with neurology  [AH]  [phone] Case discussed with Dr. [facility] Ross- feels the patient may benefit from MRI Thoracic and Lumbar spine w/wo- +/- neuro consult and potential PT OT eval [AH]  1818 I discussed the case with the patient's daughter who is a long-term employee of [employer].  She reports that her mom has been falling all the time at home and is unable to stand on her own even with assistance.  She reports that this morning she tried to get up off the couch and fell to the floor.  She is extremely concerned about her safety given her age.  She reports that she is stumbling all over the place and her legs give out from under her. [AH]  1924 Case discussed again with Dr. Tobie with the hospitalist service with new information he agrees to admission for the patient at this time. [AH]    Clinical Course User Index [AH] Arloa Chroman, PA-C   {Click here for ABCD2, HEART and other calculators REFRESH Note before signing:1}                              Medical Decision Making Amount and/or Complexity of Data Reviewed Labs: ordered. Decision-making details documented in ED Course. Radiology: ordered.  Risk Decision regarding hospitalization.   ***  {Document critical care time when  appropriate  Document review of labs and clinical decision tools ie CHADS2VASC2, etc  Document your independent review of radiology images and any outside records  Document your discussion with family members, caretakers and with consultants  Document social determinants of health affecting pt's care  Document your decision making why or why not admission, treatments were needed:32947:::1}   Final diagnoses:  None    ED Discharge Orders     None

## 2024-03-16 NOTE — ED Notes (Signed)
 Pt ask for something to eat and drink, provided macaroni and cheese lean crusine and a diet soda/

## 2024-03-16 NOTE — Progress Notes (Signed)
 Plan of Care Note for accepted transfer   Patient: [name] MRN: [id number]   DOA: 03/16/2024  Facility requesting transfer: [facility] Requesting Provider: Lavanda Lesches, PA Reason for transfer: Lower extremity weakness Facility course:  Patient is an 83 year old female with history of CVA, T2DM, HTN, HLD, asthma, GAD, GERD, right breast cancer who was last admitted 6/5-6/8 for hypoxia and asthma exacerbation due to parainfluenza infection.  Presents to the ED with progressively worsening weakness in bilateral lower extremities.  Has had multiple falls and difficulty ambulating even with walker.  Per EDP has primarily proximal muscle weakness of both lower extremities with strength 4/5.  Also reports pain at waist.  Unable to ambulate with assistance.  Vitals notable for hypertension with BP 200/101 otherwise stable.  Labs show CK 25, ESR 18, CRP in process, serum glucose 431.  CT head without contrast negative for acute intracranial process.  2 view chest x-ray negative for focal consolidation, edema, effusion.  EDP spoke with neurology (Dr. Matthews) who recommended medical admission, further imaging with MRI thoracic and lumbar spine w/wo.  The hospitalist service was consulted to admit.  Plan of care: The patient is accepted for admission to Telemetry unit, at [facility].  Author: Jorie Blanch, MD 03/16/2024  Check [email or web address] for on-call coverage.  Nursing staff, Please call [facility] Admits & Consults System-Wide number on Amion as soon as patient's arrival, so appropriate admitting provider can evaluate the pt.

## 2024-03-16 NOTE — Progress Notes (Signed)
 Plan of Care Note for deferred transfer   Patient: [name] MRN: [id number]   DOA: 03/16/2024  Facility requesting transfer: [facility] Requesting Provider: Randol Reason for transfer: weakness  Facility course: Patient with h/o CVA, breast CA, DM, HTN, and HLD who presented on 7/5 with SOB.  She was last hospitalized from 6/5-8 with acute respiratory failure associated with parainfluenza virus.  Since then, she has had generalized weakness and new imbalance, B leg pain, progressively worse. Also with chronic SOB.  She appears to have proximal weakness at the hips (4/5).  She takes very short steps and needs help with turning.  Neurologically intact otherwise.  CK negative, on Lipitor .  Pending ESR/CRP.  Taken off Jardiance , glucose elevated.  ?CVA.     Plan of care: Unfortunately, with unremarkable labs and imaging, the patient does not currently have any criteria needing acute hospitalization.  Consider discussion with neurology, transfer to a larger ER for PT/OT evaluations and MRI.  If significant medical cause other than deconditioning is determined, [facility] would be happy to be reconsulted for possible admission.  Author: Delon Herald, MD 03/16/2024  Check [email or web address] for on-call coverage.  Nursing staff, Please call [facility] Admits & Consults System-Wide number on Amion as soon as patient's arrival, so appropriate admitting provider can evaluate the pt.

## 2024-03-16 NOTE — ED Notes (Signed)
 Fall risk bundle is currently in place.

## 2024-03-16 NOTE — ED Triage Notes (Signed)
 Hard to catch her breath for about a month, hasn't seen a doctor. Also complains of lower extremity pain  for 3 months, hasn't seen any dr for that. Pt has been having falls due to her pain in her legs. Hasn't hit head or had LOC.   Pt is dm, stopped Jardiance  a month ago, cbg's in 300's.

## 2024-03-17 ENCOUNTER — Observation Stay (HOSPITAL_COMMUNITY)

## 2024-03-17 ENCOUNTER — Encounter (HOSPITAL_COMMUNITY): Payer: Self-pay | Admitting: Internal Medicine

## 2024-03-17 DIAGNOSIS — R29898 Other symptoms and signs involving the musculoskeletal system: Secondary | ICD-10-CM | POA: Diagnosis not present

## 2024-03-17 DIAGNOSIS — R296 Repeated falls: Secondary | ICD-10-CM | POA: Diagnosis not present

## 2024-03-17 DIAGNOSIS — M4316 Spondylolisthesis, lumbar region: Secondary | ICD-10-CM | POA: Diagnosis not present

## 2024-03-17 DIAGNOSIS — D1809 Hemangioma of other sites: Secondary | ICD-10-CM | POA: Diagnosis not present

## 2024-03-17 DIAGNOSIS — E1165 Type 2 diabetes mellitus with hyperglycemia: Secondary | ICD-10-CM

## 2024-03-17 DIAGNOSIS — E1140 Type 2 diabetes mellitus with diabetic neuropathy, unspecified: Secondary | ICD-10-CM | POA: Diagnosis not present

## 2024-03-17 DIAGNOSIS — M5124 Other intervertebral disc displacement, thoracic region: Secondary | ICD-10-CM | POA: Diagnosis not present

## 2024-03-17 DIAGNOSIS — N3000 Acute cystitis without hematuria: Secondary | ICD-10-CM | POA: Diagnosis not present

## 2024-03-17 DIAGNOSIS — M6250 Muscle wasting and atrophy, not elsewhere classified, unspecified site: Secondary | ICD-10-CM

## 2024-03-17 DIAGNOSIS — M40209 Unspecified kyphosis, site unspecified: Secondary | ICD-10-CM | POA: Diagnosis not present

## 2024-03-17 DIAGNOSIS — M5030 Other cervical disc degeneration, unspecified cervical region: Secondary | ICD-10-CM | POA: Diagnosis not present

## 2024-03-17 DIAGNOSIS — M4186 Other forms of scoliosis, lumbar region: Secondary | ICD-10-CM | POA: Diagnosis not present

## 2024-03-17 DIAGNOSIS — N390 Urinary tract infection, site not specified: Secondary | ICD-10-CM | POA: Insufficient documentation

## 2024-03-17 DIAGNOSIS — M48061 Spinal stenosis, lumbar region without neurogenic claudication: Secondary | ICD-10-CM | POA: Diagnosis not present

## 2024-03-17 LAB — GLUCOSE, CAPILLARY
Glucose-Capillary: 242 mg/dL — ABNORMAL HIGH (ref 70–99)
Glucose-Capillary: 304 mg/dL — ABNORMAL HIGH (ref 70–99)
Glucose-Capillary: 320 mg/dL — ABNORMAL HIGH (ref 70–99)
Glucose-Capillary: 386 mg/dL — ABNORMAL HIGH (ref 70–99)

## 2024-03-17 LAB — COMPREHENSIVE METABOLIC PANEL WITH GFR
ALT: 26 U/L (ref 0–44)
AST: 15 U/L (ref 15–41)
Albumin: 3.7 g/dL (ref 3.5–5.0)
Alkaline Phosphatase: 70 U/L (ref 38–126)
Anion gap: 16 — ABNORMAL HIGH (ref 5–15)
BUN: 16 mg/dL (ref 8–23)
CO2: 22 mmol/L (ref 22–32)
Calcium: 9.5 mg/dL (ref 8.9–10.3)
Chloride: 97 mmol/L — ABNORMAL LOW (ref 98–111)
Creatinine, Ser: 0.72 mg/dL (ref 0.44–1.00)
GFR, Estimated: >60 mL/min (ref >60)
Glucose, Bld: 262 mg/dL — ABNORMAL HIGH (ref 70–99)
Potassium: 3.5 mmol/L (ref 3.5–5.1)
Sodium: 135 mmol/L (ref 135–145)
Total Bilirubin: 0.7 mg/dL (ref 0.0–1.2)
Total Protein: 7.1 g/dL (ref 6.5–8.1)

## 2024-03-17 LAB — CBC
HCT: 44.9 % (ref 36.0–46.0)
Hemoglobin: 16 g/dL — ABNORMAL HIGH (ref 12.0–15.0)
MCH: 31.7 pg (ref 26.0–34.0)
MCHC: 35.6 g/dL (ref 30.0–36.0)
MCV: 89.1 fL (ref 80.0–100.0)
Platelets: 231 K/uL (ref 150–400)
RBC: 5.04 MIL/uL (ref 3.87–5.11)
RDW: 13.7 % (ref 11.5–15.5)
WBC: 8.6 K/uL (ref 4.0–10.5)
nRBC: 0 % (ref 0.0–0.2)

## 2024-03-17 LAB — FOLATE: Folate: 11.4 ng/mL (ref >5.9)

## 2024-03-17 LAB — VITAMIN B12: Vitamin B-12: 380 pg/mL (ref 180–914)

## 2024-03-17 LAB — TSH: TSH: 2.713 u[IU]/mL (ref 0.350–4.500)

## 2024-03-17 MED ORDER — INSULIN ASPART 100 UNIT/ML IJ SOLN
4.0000 [IU] | Freq: Three times a day (TID) | INTRAMUSCULAR | Status: DC
  Administered 2024-03-17 (×3): 4 [IU] via SUBCUTANEOUS

## 2024-03-17 MED ORDER — METFORMIN HCL 500 MG PO TABS: 500.0000 mg | ORAL_TABLET | Freq: Two times a day (BID) | ORAL | 2 refills | Status: AC

## 2024-03-17 MED ORDER — DULOXETINE HCL 30 MG PO CPEP
30.0000 mg | ORAL_CAPSULE | Freq: Every day | ORAL | Status: DC
  Administered 2024-03-17: 30 mg via ORAL
  Filled 2024-03-17: qty 1

## 2024-03-17 MED ORDER — INSULIN ASPART 100 UNIT/ML IJ SOLN
0.0000 [IU] | Freq: Three times a day (TID) | INTRAMUSCULAR | Status: DC
  Administered 2024-03-17 (×2): 7 [IU] via SUBCUTANEOUS
  Administered 2024-03-17: 3 [IU] via SUBCUTANEOUS

## 2024-03-17 MED ORDER — PANTOPRAZOLE SODIUM 40 MG PO TBEC
40.0000 mg | DELAYED_RELEASE_TABLET | Freq: Every day | ORAL | Status: DC
  Administered 2024-03-17: 40 mg via ORAL
  Filled 2024-03-17: qty 1

## 2024-03-17 MED ORDER — ACETAMINOPHEN 650 MG RE SUPP: 650.0000 mg | Freq: Four times a day (QID) | RECTAL | Status: DC | PRN

## 2024-03-17 MED ORDER — SODIUM CHLORIDE 0.9 % IV SOLN
2.0000 g | INTRAVENOUS | Status: DC
  Administered 2024-03-17: 2 g via INTRAVENOUS
  Filled 2024-03-17: qty 20

## 2024-03-17 MED ORDER — MELATONIN 3 MG PO TABS
3.0000 mg | ORAL_TABLET | Freq: Once | ORAL | Status: AC
  Administered 2024-03-17: 3 mg via ORAL
  Filled 2024-03-17: qty 1

## 2024-03-17 MED ORDER — GLIPIZIDE ER 2.5 MG PO TB24
2.5000 mg | ORAL_TABLET | Freq: Every day | ORAL | 2 refills | Status: AC
Start: 2024-03-17 — End: ?

## 2024-03-17 MED ORDER — HYDRALAZINE HCL 20 MG/ML IJ SOLN
5.0000 mg | INTRAMUSCULAR | Status: DC | PRN
  Administered 2024-03-17: 5 mg via INTRAVENOUS
  Filled 2024-03-17: qty 1

## 2024-03-17 MED ORDER — AMLODIPINE BESYLATE 5 MG PO TABS
5.0000 mg | ORAL_TABLET | Freq: Every day | ORAL | Status: DC
  Administered 2024-03-17: 5 mg via ORAL
  Filled 2024-03-17: qty 1

## 2024-03-17 MED ORDER — GADOBUTROL 1 MMOL/ML IV SOLN
7.0000 mL | Freq: Once | INTRAVENOUS | Status: AC | PRN
  Administered 2024-03-17: 7 mL via INTRAVENOUS

## 2024-03-17 MED ORDER — INSULIN GLARGINE-YFGN 100 UNIT/ML ~~LOC~~ SOLN
15.0000 [IU] | Freq: Every day | SUBCUTANEOUS | Status: DC
  Administered 2024-03-17: 15 [IU] via SUBCUTANEOUS
  Filled 2024-03-17 (×2): qty 0.15

## 2024-03-17 MED ORDER — ACETAMINOPHEN 325 MG PO TABS
650.0000 mg | ORAL_TABLET | Freq: Four times a day (QID) | ORAL | Status: DC | PRN
  Administered 2024-03-17: 650 mg via ORAL
  Filled 2024-03-17: qty 2

## 2024-03-17 MED ORDER — MONTELUKAST SODIUM 10 MG PO TABS: 10.0000 mg | ORAL_TABLET | Freq: Every day | ORAL | Status: DC

## 2024-03-17 MED ORDER — ATORVASTATIN CALCIUM 80 MG PO TABS: 80.0000 mg | ORAL_TABLET | Freq: Every day | ORAL | Status: DC

## 2024-03-17 MED ORDER — VITAMIN B-12 1000 MCG PO TABS
1000.0000 ug | ORAL_TABLET | Freq: Every day | ORAL | Status: DC
  Administered 2024-03-17: 1000 ug via ORAL
  Filled 2024-03-17: qty 1

## 2024-03-17 MED ORDER — CLOPIDOGREL BISULFATE 75 MG PO TABS
75.0000 mg | ORAL_TABLET | Freq: Every day | ORAL | Status: DC
  Administered 2024-03-17: 75 mg via ORAL
  Filled 2024-03-17: qty 1

## 2024-03-17 MED ORDER — ALBUTEROL SULFATE (2.5 MG/3ML) 0.083% IN NEBU: 2.5000 mg | INHALATION_SOLUTION | Freq: Four times a day (QID) | RESPIRATORY_TRACT | Status: DC | PRN

## 2024-03-17 MED ORDER — CYANOCOBALAMIN 1000 MCG PO TABS: 1000.0000 ug | ORAL_TABLET | Freq: Every day | ORAL | 2 refills | Status: AC

## 2024-03-17 NOTE — H&P (Signed)
 History and Physical    [name] FMW:[id number] DOB: 01-Jan-1941 DOA: 03/16/2024  Patient coming from: Home.  Chief Complaint: Lower extremity weakness.  HPI: [name] is a 83 y.o. female with history of diabetes mellitus type 2, asthma, hypertension, hyperlipidemia, prior stroke, GERD, history of depression was admitted last month for acute bronchitis and was positive for parainfluenza infection has been experiencing increasing weakness with difficulty ambulating over the last 1 month.  Patient states that when she tries to walk her legs buckle.  Denies any incontinence of urine or bowel.  Denies any weakness of the upper extremities.  Has some pain in the lower extremity.  Her primary care physician discontinued her Jardiance  about 2 weeks ago concerning that these symptoms may be related to the medicine.  ED Course: In the ER patient appears nonfocal.  CT of the head did not show anything acute.  EKG shows normal sinus rhythm.  ER physician discussed with on-call neurologist Dr. Matthews who advised getting MRI T-spine L-spine.  Patient's labs are largely unremarkable.  Patient admitted for further observation.  CK level is 25.  Review of Systems: As per HPI, rest all negative.   Past Medical History:  Diagnosis Date   Aortic atherosclerosis (HCC)    Asthma    Breast CA (HCC)    Cancer (HCC)    right breast mastectomy   CVA (cerebral vascular accident) (HCC) 04/27/2015   DDD (degenerative disc disease), cervical    Diabetes mellitus without complication (HCC)    Duodenal ulcer    External hemorrhoids    Generalized anxiety disorder    H/O chest pain 03/24/2006   normal nuclear  study   Headache    High cholesterol    Hypertension    Mild aortic insufficiency 03/28/2006   echo   Normal cardiac stress test 2005   Pancreatitis    Personal history of chemotherapy    Personal history of radiation therapy    PONV (postoperative nausea and vomiting)    Sinus bradycardia on ECG     Stroke [facility])     Past Surgical History:  Procedure Laterality Date   ABDOMINAL HYSTERECTOMY     APPENDECTOMY     30 years ago   childbirth     x 2   CHOLECYSTECTOMY     15 years ago   ESOPHAGOGASTRODUODENOSCOPY N/A 03/04/2014   Procedure: ESOPHAGOGASTRODUODENOSCOPY (EGD);  Surgeon: Belvie JONETTA Just, MD;  Location: [facility] ENDOSCOPY;  Service: Endoscopy;  Laterality: N/A;   MASTECTOMY  1993   right breast   RIGHT/LEFT HEART CATH AND CORONARY ANGIOGRAPHY N/A 05/30/2019   Procedure: RIGHT/LEFT HEART CATH AND CORONARY ANGIOGRAPHY;  Surgeon: Dann Candyce RAMAN, MD;  Location: [facility] INVASIVE CV LAB;  Service: Cardiovascular;  Laterality: N/A;   TUBAL LIGATION       reports that she has never smoked. She has never used smokeless tobacco. She reports that she does not drink alcohol and does not use drugs.  Allergies  Allergen Reactions   Oxycodone -Acetaminophen  Nausea And Vomiting   Ciprofloxacin  Nausea And Vomiting    Other reaction(s): Other (See Comments)   Codeine Nausea And Vomiting   Doxycycline  Nausea And Vomiting    REACTION: GI upset   Percocet [Oxycodone -Acetaminophen ] Nausea And Vomiting   Tramadol  Nausea And Vomiting    Other reaction(s): Other (See Comments)    Family History  Problem Relation Age of Onset   Heart attack Mother    Heart disease Mother    Hypertension Mother  Asthma Mother    Heart attack Father    Heart disease Father    Hypertension Sister    Heart attack Sister    Colon cancer Sister 59   Hypertension Sister    Heart attack Sister    Heart attack Sister    Lung disease Daughter    Thyroid  disease Daughter     Prior to Admission medications   Medication Sig Start Date End Date Taking? Authorizing Provider  acetaminophen  (TYLENOL ) 500 MG tablet Take 500 mg by mouth every 6 (six) hours as needed. As needed for pain    [provider]  albuterol  (VENTOLIN  HFA) 108 (90 Base) MCG/ACT inhaler Inhale 2 puffs into the lungs every 6  (six) hours as needed. 02/08/24   Leath-Warren, Etta PARAS, NP  amLODipine  (NORVASC ) 5 MG tablet Take 5 mg by mouth daily.    [provider]  atorvastatin  (LIPITOR ) 80 MG tablet Take 1 tablet (80 mg total) by mouth daily at 6 PM. 07/06/15   Jerri Pfeiffer, MD  chlorpheniramine-HYDROcodone (TUSSIONEX) 10-8 MG/5ML Take 5 mLs by mouth every 12 (twelve) hours as needed for cough. 02/18/24   Patsy Lenis, MD  clopidogrel  (PLAVIX ) 75 MG tablet Take 1 tablet (75 mg total) by mouth daily. 06/01/17   Jerri Pfeiffer, MD  DULoxetine  (CYMBALTA ) 30 MG capsule Take 30 mg by mouth daily. 05/05/21   [provider]  ipratropium (ATROVENT ) 0.03 % nasal spray Place 2 sprays into both nostrils 2 (two) times daily. 02/09/24   [provider]  ipratropium-albuterol  (DUONEB) 0.5-2.5 (3) MG/3ML SOLN Take 3 mLs by nebulization every 6 (six) hours as needed. 02/18/24   Patsy Lenis, MD  JARDIANCE  10 MG TABS tablet Take 10 mg by mouth daily. 03/03/23   [provider]  montelukast  (SINGULAIR ) 10 MG tablet Take 1 tablet (10 mg total) by mouth at bedtime. 05/29/21   Singh, Prashant K, MD  pantoprazole  (PROTONIX ) 40 MG tablet Take 30-60 min before first meal of the day 12/23/22   Darlean Ozell NOVAK, MD    Physical Exam: Constitutional: Moderately built and nourished. Vitals:   03/16/24 1800 03/16/24 2015 03/16/24 2136 03/16/24 2217  BP: (!) 196/78 (!) 181/72 (!) 181/103 (!) 169/86  Pulse: 74 87 87 86  Resp: 18 17 17 19   Temp:  97.6 F (36.4 C) 97.7 F (36.5 C) 97.7 F (36.5 C)  TempSrc:  Oral Oral Oral  SpO2: 99% 97% 97% 94%   Eyes: Anicteric no pallor. ENMT: No discharge from the ears eyes nose normal. Neck: No mass felt.  No neck rigidity. Respiratory: No rhonchi or crepitations. Cardiovascular: S1-S2 heard. Abdomen: Soft nontender bowel sound present. Musculoskeletal: No edema. Skin: No rash. Neurologic: Alert awake oriented time place and person.  Moving all extremities 5 x 5.  No deep  tendon reflexes elicitable.  Pupils equal reacting to light.  No facial asymmetry. Psychiatric: Appears normal.  Normal affect.   Labs on Admission: I have personally reviewed following labs and imaging studies  CBC: Recent Labs  Lab 03/16/24 1148  WBC 8.2  HGB 14.6  HCT 41.8  MCV 88.9  PLT 241   Basic Metabolic Panel: Recent Labs  Lab 03/16/24 1148  NA 135  K 4.2  CL 100  CO2 24  GLUCOSE 431*  BUN 22  CREATININE 0.95  CALCIUM  10.0   GFR: CrCl cannot be calculated (Unknown ideal weight.). Liver Function Tests: Recent Labs  Lab 03/16/24 1148  AST 13*  ALT 26  ALKPHOS  93  BILITOT 0.7  PROT 6.9  ALBUMIN  4.2   No results for input(s): LIPASE, AMYLASE in the last 168 hours. No results for input(s): AMMONIA in the last 168 hours. Coagulation Profile: No results for input(s): INR, PROTIME in the last 168 hours. Cardiac Enzymes: Recent Labs  Lab 03/16/24 1430  CKTOTAL 25*   BNP (last 3 results) Recent Labs    02/15/24 1125 03/16/24 1430  PROBNP 224.0 261.0   HbA1C: No results for input(s): HGBA1C in the last 72 hours. CBG: Recent Labs  Lab 03/16/24 1150 03/17/24 0039  GLUCAP 388* 386*   Lipid Profile: No results for input(s): CHOL, HDL, LDLCALC, TRIG, CHOLHDL, LDLDIRECT in the last 72 hours. Thyroid  Function Tests: No results for input(s): TSH, T4TOTAL, FREET4, T3FREE, THYROIDAB in the last 72 hours. Anemia Panel: No results for input(s): VITAMINB12, FOLATE, FERRITIN, TIBC, IRON, RETICCTPCT in the last 72 hours. Urine analysis:    Component Value Date/Time   COLORURINE YELLOW 03/16/2024 1333   APPEARANCEUR CLEAR 03/16/2024 1333   LABSPEC 1.040 (H) 03/16/2024 1333   PHURINE 5.5 03/16/2024 1333   GLUCOSEU >1,000 (A) 03/16/2024 1333   HGBUR NEGATIVE 03/16/2024 1333   BILIRUBINUR NEGATIVE 03/16/2024 1333   KETONESUR NEGATIVE 03/16/2024 1333   PROTEINUR NEGATIVE 03/16/2024 1333   UROBILINOGEN 0.2  06/30/2014 1930   NITRITE POSITIVE (A) 03/16/2024 1333   LEUKOCYTESUR MODERATE (A) 03/16/2024 1333   Sepsis Labs: @LABRCNTIP (procalcitonin:4,lacticidven:4) )No results found for this or any previous visit (from the past 240 hours).   Radiological Exams on Admission: CT Head Wo Contrast Result Date: 03/16/2024 CLINICAL DATA:  Neuro deficit, acute, stroke suspected. EXAM: CT HEAD WITHOUT CONTRAST TECHNIQUE: Contiguous axial images were obtained from the base of the skull through the vertex without intravenous contrast. RADIATION DOSE REDUCTION: This exam was performed according to the departmental dose-optimization program which includes automated exposure control, adjustment of the mA and/or kV according to patient size and/or use of iterative reconstruction technique. COMPARISON:  09/10/2023. FINDINGS: Brain: No acute intracranial hemorrhage, midline shift, or mass effect is seen. No extra-axial fluid collection. Mild diffuse atrophy is noted. Periventricular white matter hypodensities are present bilaterally. No hydrocephalus. Vascular: No hyperdense vessel or unexpected calcification. Skull: Normal. Negative for fracture or focal lesion. Sinuses/Orbits: Mucosal thickening is noted in the left maxillary sinus. No acute orbital abnormality. Other: None. IMPRESSION: 1. No acute intracranial process. 2. Mild atrophy and chronic microvascular ischemic changes. Electronically Signed   By: Leita Birmingham M.D.   On: 03/16/2024 15:00   DG Chest 2 View Result Date: 03/16/2024 CLINICAL DATA:  Cough and shortness of breath. EXAM: CHEST - 2 VIEW COMPARISON:  02/15/2024. FINDINGS: The heart size and mediastinal contours are within normal limits. There is atherosclerotic calcification of the aorta. Mild interstitial prominence is noted bilaterally no consolidation, effusion, or pneumothorax is seen. There are degenerative changes and scoliosis in the thoracic spine. Surgical lips are noted in the right chest wall and  right upper quadrant. No acute osseous abnormality. IMPRESSION: No active cardiopulmonary disease. Electronically Signed   By: Leita Birmingham M.D.   On: 03/16/2024 14:55    EKG: Independently reviewed.  Normal sinus rhythm.  Assessment/Plan Principal Problem:   Lower extremity weakness Active Problems:   Type 2 diabetes mellitus with hyperglycemia (HCC)   Chronic diastolic CHF (congestive heart failure) (HCC)   GERD without esophagitis   ADENOCARCINOMA, BREAST, RIGHT   HLD (hyperlipidemia)   History of stroke    Bilateral lower extremity weakness -  discussed with neurologist Dr. Salman will be seeing patient in consult.  MRI of the T and L-spine pending.  Patient denies any difficulty swallowing or breathing.  Awaiting further recommendations from neurologist.  Neurology recommended holding off statins. Diabetes mellitus type 2 last hemoglobin A1c last months ago was 8.3.  Jardiance  was recently discontinued.  Patient's blood sugars are elevated will start patient on Lantus  10 units with Premeal insulin . Possible UTI on ceftriaxone .  Check urine cultures. Hypertension on amlodipine  will also add as needed IV hydralazine  follow blood pressure trends. History of stroke on statin and Plavix .  Neurologist requested holding statins for now. GERD on PPI. Asthma on Singulair .   Prior history of breast cancer. Chronic diastolic CHF appears compensated.  Since patient has lower extremity weakness will need further workup and more than 2 midnight stay.   DVT prophylaxis: SCDs.  Holding anticoagulation in anticipation of possible procedure. Code Status: Full code. Family Communication: Discussed with patient. Disposition Plan: Medical floor. Consults called: Neurologist. Admission status: Observation.

## 2024-03-17 NOTE — Consult Note (Signed)
 NEUROLOGY CONSULT NOTE   Date of service: March 17, 2024 Patient Name: NOELLA KIPNIS MRN:  [id number] DOB:  11-11-1940 Chief Complaint: Consulted for bilateral lower extremity weakness and difficulty walking Requesting Provider: Raenelle Coria, MD  History of Present Illness  TONIKA EDEN is a 83 y.o. female with hx of hyperlipidemia, hypertension, diabetes type 2, prior strokes, GERD, depression who presents with difficulty with ambulation over the last month.  Patient reports that she has noted some weakness in her bilateral lower extremities about 2 months ago.  It was just more difficult to get up.  She reports that over the last month, she has been having trouble with walking.  She saw her PCP and was encouraged to use a walker.  However, she does not want to use a walker.  She reports worry that she is going to fall and therefore has been walking less than.  She reports that her weakness has been gradually progressive over the course of last 2 months. She lives a sedentary lifestyle and spends most of her time in the couch.  She reports that she has had about 4 falls in the last 4 weeks.  She has not had any physical therapy or occupational therapy outpatient.  She saw her PCP and she reports that her PCP felt that this could have been secondary to Jardiance  and this was stopped.  She feels like her diabetes has been less well-controlled since stopping Jardiance .  She reports she eats a good mix of meat, vegetables and dairy.  She endorses tingling in bilateral feet specifically in the bottom of her feet and the toes.  Tingling is worse at night.  She is on atorvastatin  but has been on it for several years.  Her CK is 25.  Patient denies any saddle anesthesia, no bowel or bladder incontinence, no sensory deficit.  She denies any fever or pain.  She denies any upper extremity weakness or any difficulty breathing or swallowing.    ROS  Comprehensive ROS performed and pertinent positives  documented in HPI   Past History   Past Medical History:  Diagnosis Date   Aortic atherosclerosis (HCC)    Asthma    Breast CA (HCC)    Cancer (HCC)    right breast mastectomy   CVA (cerebral vascular accident) (HCC) 04/27/2015   DDD (degenerative disc disease), cervical    Diabetes mellitus without complication (HCC)    Duodenal ulcer    External hemorrhoids    Generalized anxiety disorder    H/O chest pain 03/24/2006   normal nuclear  study   Headache    High cholesterol    Hypertension    Mild aortic insufficiency 03/28/2006   echo   Normal cardiac stress test 2005   Pancreatitis    Personal history of chemotherapy    Personal history of radiation therapy    PONV (postoperative nausea and vomiting)    Sinus bradycardia on ECG    Stroke [facility])     Past Surgical History:  Procedure Laterality Date   ABDOMINAL HYSTERECTOMY     APPENDECTOMY     30 years ago   childbirth     x 2   CHOLECYSTECTOMY     15 years ago   ESOPHAGOGASTRODUODENOSCOPY N/A 03/04/2014   Procedure: ESOPHAGOGASTRODUODENOSCOPY (EGD);  Surgeon: Belvie JONETTA Just, MD;  Location: [facility] ENDOSCOPY;  Service: Endoscopy;  Laterality: N/A;   MASTECTOMY  1993   right breast   RIGHT/LEFT HEART CATH AND CORONARY ANGIOGRAPHY N/A  05/30/2019   Procedure: RIGHT/LEFT HEART CATH AND CORONARY ANGIOGRAPHY;  Surgeon: Dann Candyce RAMAN, MD;  Location: [facility] INVASIVE CV LAB;  Service: Cardiovascular;  Laterality: N/A;   TUBAL LIGATION      Family History: Family History  Problem Relation Age of Onset   Heart attack Mother    Heart disease Mother    Hypertension Mother    Asthma Mother    Heart attack Father    Heart disease Father    Hypertension Sister    Heart attack Sister    Colon cancer Sister 25   Hypertension Sister    Heart attack Sister    Heart attack Sister    Lung disease Daughter    Thyroid  disease Daughter     Social History  reports that she has never smoked. She has never used smokeless  tobacco. She reports that she does not drink alcohol and does not use drugs.  Allergies  Allergen Reactions   Oxycodone -Acetaminophen  Nausea And Vomiting   Ciprofloxacin  Nausea And Vomiting    Other reaction(s): Other (See Comments)   Codeine Nausea And Vomiting   Doxycycline  Nausea And Vomiting    REACTION: GI upset   Percocet [Oxycodone -Acetaminophen ] Nausea And Vomiting   Tramadol  Nausea And Vomiting    Other reaction(s): Other (See Comments)    Medications   Current Facility-Administered Medications:    acetaminophen  (TYLENOL ) tablet 650 mg, 650 mg, Oral, Q6H PRN **OR** acetaminophen  (TYLENOL ) suppository 650 mg, 650 mg, Rectal, Q6H PRN, Franky Redia SAILOR, MD   albuterol  (PROVENTIL ) (2.5 MG/3ML) 0.083% nebulizer solution 2.5 mg, 2.5 mg, Inhalation, Q6H PRN, Franky Redia SAILOR, MD   amLODipine  (NORVASC ) tablet 5 mg, 5 mg, Oral, Daily, Kakrakandy, Arshad N, MD   cefTRIAXone  (ROCEPHIN ) 2 g in sodium chloride  0.9 % 100 mL IVPB, 2 g, Intravenous, Q24H, Franky Redia SAILOR, MD, Stopped at 03/17/24 0500   clopidogrel  (PLAVIX ) tablet 75 mg, 75 mg, Oral, Daily, Franky Redia SAILOR, MD   DULoxetine  (CYMBALTA ) DR capsule 30 mg, 30 mg, Oral, Daily, Franky Redia SAILOR, MD   hydrALAZINE  (APRESOLINE ) injection 5 mg, 5 mg, Intravenous, Q4H PRN, Franky Redia SAILOR, MD, 5 mg at 03/17/24 0500   insulin  aspart (novoLOG ) injection 0-9 Units, 0-9 Units, Subcutaneous, TID WC, Franky Redia SAILOR, MD, 7 Units at 03/17/24 9375   insulin  aspart (novoLOG ) injection 4 Units, 4 Units, Subcutaneous, TID WC, Franky Redia SAILOR, MD   insulin  glargine-yfgn (SEMGLEE ) injection 15 Units, 15 Units, Subcutaneous, QHS, Kakrakandy, Arshad N, MD, 15 Units at 03/17/24 0132   montelukast  (SINGULAIR ) tablet 10 mg, 10 mg, Oral, QHS, Franky Redia SAILOR, MD   pantoprazole  (PROTONIX ) EC tablet 40 mg, 40 mg, Oral, Daily, Franky Redia SAILOR, MD  Vitals   Vitals:   03/16/24 2217 03/17/24 0055 03/17/24 0421  03/17/24 0530  BP: (!) 169/86  (!) 189/93 (!) 182/75  Pulse: 86  70 67  Resp: 19  18   Temp: 97.7 F (36.5 C)  98.7 F (37.1 C)   TempSrc: Oral     SpO2: 94% 94% 99% 100%    There is no height or weight on file to calculate BMI.   Physical Exam   General: Laying comfortably in bed; in no acute distress.  HENT: Normal oropharynx and mucosa. Normal external appearance of ears and nose.  Neck: Supple, no pain or tenderness  CV: No JVD. No peripheral edema.  Pulmonary: Symmetric Chest rise. Normal respiratory effort.  Abdomen: Soft to touch, non-tender.  Ext: No cyanosis, edema,  or deformity  Skin: No rash. Normal palpation of skin.   Musculoskeletal: Normal digits and nails by inspection. No clubbing.   Neurologic Examination  Mental status/Cognition: Alert, oriented to self, place, month and year, good attention.  Speech/language: Fluent, comprehension intact, object naming intact, repetition intact.  Cranial nerves:   CN II Pupils equal and reactive to light, no VF deficits    CN III,IV,VI EOM intact, no gaze preference or deviation, no nystagmus    CN V normal sensation in V1, V2, and V3 segments bilaterally    CN VII no asymmetry, no nasolabial fold flattening    CN VIII normal hearing to speech    CN IX & X normal palatal elevation, no uvular deviation    CN XI 5/5 head turn and 5/5 shoulder shrug bilaterally    CN XII midline tongue protrusion    Motor:  Muscle bulk: poor, tone normal, pronator drift none tremor none Mvmt Root Nerve  Muscle Right Left Comments  SA C5/6 Ax Deltoid 5 5   EF C5/6 Mc Biceps 5 5   EE C6/7/8 Rad Triceps 5 5   WF C6/7 Med FCR     WE C7/8 PIN ECU     F Ab C8/T1 U ADM/FDI 5 5   HF L1/2/3 Fem Illopsoas 4 4   KE L2/3/4 Fem Quad 5 5   DF L4/5 D Peron Tib Ant 5 5   PF S1/2 Tibial Grc/Sol 5 5    Reflexes:  Right Left Comments  Pectoralis      Biceps (C5/6) 0 0   Brachioradialis (C5/6) 0 0    Triceps (C6/7) 0 0    Patellar (L3/4) 1 1     Achilles (S1) 0 0    Hoffman      Plantar Withdraws Withdraws   Jaw jerk    Sensation:  Light touch Intact throughout   Pin prick Intact throughout   Temperature    Vibration   Proprioception Decreased in bilateral big toes   Coordination/Complex Motor:  - Finger to Nose intact bilaterally - Heel to shin intact bilaterally - Rapid alternating movement are normal - Gait: Deferred for patient safety.   Labs/Imaging/Neurodiagnostic studies   CBC:  Recent Labs  Lab April 07, 2024 1148  WBC 8.2  HGB 14.6  HCT 41.8  MCV 88.9  PLT 241   Basic Metabolic Panel:  Lab Results  Component Value Date   NA 135 07-Apr-2024   K 4.2 07-Apr-2024   CO2 24 07-Apr-2024   GLUCOSE 431 (H) Apr 07, 2024   BUN 22 2024-04-07   CREATININE 0.95 04/07/24   CALCIUM  10.0 2024-04-07   GFRNONAA 59 (L) 2024-04-07   GFRAA 75 05/27/2019   Lipid Panel:  Lab Results  Component Value Date   LDLCALC 38 07/12/2019   HgbA1c:  Lab Results  Component Value Date   HGBA1C 8.3 (H) 02/15/2024   Urine Drug Screen:     Component Value Date/Time   LABOPIA NONE DETECTED 06/30/2014 1930   COCAINSCRNUR NONE DETECTED 06/30/2014 1930   LABBENZ NONE DETECTED 06/30/2014 1930   AMPHETMU NONE DETECTED 06/30/2014 1930   THCU NONE DETECTED 06/30/2014 1930   LABBARB NONE DETECTED 06/30/2014 1930    Alcohol Level     Component Value Date/Time   ETH <11 06/30/2014 1745   INR  Lab Results  Component Value Date   INR 1.04 05/03/2015   APTT  Lab Results  Component Value Date   APTT 28 05/03/2015   AED levels: No  results found for: PHENYTOIN, ZONISAMIDE, LAMOTRIGINE, LEVETIRACETA  CT Head without contrast(Personally reviewed): CTH was negative for a large hypodensity concerning for a large territory infarct or hyperdensity concerning for an ICH  MRI of the T and L-spine with and without contrast (Personally reviewed): MRI T-spine: 1. Generally mild for age Thoracic spine degeneration, superimposed on T7  through T11 interbody ankylosis. No spinal stenosis, abnormal spinal cord signal, and no acute or inflammatory process identified. 2. Intermittent thoracic posterior element degeneration. Up to moderate associated right T5 and T11 neural foraminal stenosis. 3. Chronic cervical spine disc and endplate degeneration.  MRI L-spine: 1. Lumbar MRI stable since April, no acute or inflammatory findings. 2. Chronic grade 1 spondylolisthesis at L3-L4 and L4-L5 with degeneration asymmetric to the right. Isolated mild spinal stenosis at the latter. Moderate right L5 nerve level lateral recess stenosis. Mild right L4 and L5 foraminal stenosis. 3. Mild for age lumbar degeneration otherwise.  ASSESSMENT   DIVINITY KYLER is a 83 y.o. female with hx of hyperlipidemia, hypertension, diabetes type 2, prior strokes, GERD, depression who presents with difficulty with ambulation over the last month.  Her symptoms have been gradually progressive over the course of 2 months.  She is at significant difficulties with walking over the last 4 weeks and has had 4 falls for the last 4 weeks.  She declined walker despite recommendation from her PCP.  She came to the ED for further evaluation and workup.  She had an MRI of the T and L-spine which was negative for any acute cord compression or significant impingement of the nerves in the foramens.  Overall the pattern of her weakness is most suggestive of disuse atrophy.  She has some hip flexion weakness with some atrophy but intact strength across the knees and her feet.  I do think that she has some diabetic neuropathy with decreased proprioception in bilateral big toes which I think is also contributing to her reduced confidence in her ability to walk.  Vitamin B12 levels were low normal at 380 despite her reporting that she eats a good mix of meat, vegetables and dairy.  I do not think further inpatient neurological workup is indicated at this time.  I am going to recommend  PT and OT and using a walker at this time.  I am also going to recommend outpatient follow-up with neurologist and consider EMG/nerve conduction studies if there is concern for polyneuropathy.  RECOMMENDATIONS  - PT/OT eval - B12 replacement - folate, thiamine, TSH, B6, SPEP. - outpatient neurology follow up. - we will be available as needed but do not plan to see this patient again inpatient. Okay to discharge from neurology standpiont with outpatient follow up. ______________________________________________________________________    Signed, Nickayla Mcinnis, MD [facility]

## 2024-03-17 NOTE — Progress Notes (Signed)
 Discharge instructions reviewed with patient and husband, who verbalized understanding. PIV discontinued as noted. Released home to return prn.

## 2024-03-17 NOTE — Plan of Care (Signed)
  Problem: Education: Goal: Knowledge of General Education information will improve Description: Including pain rating scale, medication(s)/side effects and non-pharmacologic comfort measures Outcome: Progressing   Problem: Health Behavior/Discharge Planning: Goal: Ability to manage health-related needs will improve Outcome: Progressing   Problem: Clinical Measurements: Goal: Ability to maintain clinical measurements within normal limits will improve Outcome: Progressing Goal: Will remain free from infection Outcome: Progressing Goal: Diagnostic test results will improve Outcome: Progressing Goal: Respiratory complications will improve Outcome: Progressing Goal: Cardiovascular complication will be avoided Outcome: Progressing   Problem: Activity: Goal: Risk for activity intolerance will decrease Outcome: Progressing   Problem: Nutrition: Goal: Adequate nutrition will be maintained Outcome: Progressing   Problem: Coping: Goal: Level of anxiety will decrease Outcome: Progressing   Problem: Elimination: Goal: Will not experience complications related to bowel motility Outcome: Progressing Goal: Will not experience complications related to urinary retention Outcome: Progressing   Problem: Pain Managment: Goal: General experience of comfort will improve and/or be controlled Outcome: Progressing   Problem: Safety: Goal: Ability to remain free from injury will improve Outcome: Progressing   Problem: Skin Integrity: Goal: Risk for impaired skin integrity will decrease Outcome: Progressing   Problem: Education: Goal: Ability to describe self-care measures that may prevent or decrease complications (Diabetes Survival Skills Education) will improve Outcome: Progressing Goal: Individualized Educational Video(s) Outcome: Progressing   Problem: Coping: Goal: Ability to adjust to condition or change in health will improve Outcome: Progressing   Problem: Fluid  Volume: Goal: Ability to maintain a balanced intake and output will improve Outcome: Progressing   Problem: Metabolic: Goal: Ability to maintain appropriate glucose levels will improve Outcome: Progressing   Problem: Nutritional: Goal: Maintenance of adequate nutrition will improve Outcome: Progressing Goal: Progress toward achieving an optimal weight will improve Outcome: Progressing   Problem: Skin Integrity: Goal: Risk for impaired skin integrity will decrease Outcome: Progressing   Problem: Tissue Perfusion: Goal: Adequacy of tissue perfusion will improve Outcome: Progressing

## 2024-03-17 NOTE — Discharge Summary (Signed)
 Physician Discharge Summary  [name] FMW:[id number] DOB: Jan 25, 1941 DOA: 03/16/2024  PCP: Leonel Cole, MD  Admit date: 03/16/2024 Discharge date: 03/17/2024  Admitted From: Home Disposition: Home with outpatient physical therapy  Recommendations for Outpatient Follow-up:  Follow up with PCP in 1-2 weeks Please obtain BMP/CBC in one week Will send referral to neurology for follow-up  Home Health: N/A Equipment/Devices: Wheeled walker  Discharge Condition: Stable CODE STATUS: Full code Diet recommendation: Low-salt and low-carb diet  Discharge summary: 83 year old with hypertension, hyperlipidemia, poorly controlled type 2 diabetes and history of stroke presented with difficulty ambulating for about 2 months with some weakness of the both lower extremities and decreased sensation.  Recently walking less and also frequently if falling.  Jardiance  was recently discontinued.  In the emergency room, hemodynamically stable.  Motor strength was normal.  She was found to have slight reduced proprioception on both lower extremities.  No other neurological deficits.  CT scan of the head, MRI of the thoracic and lumbar spine with and without contrast did not suggest any cord compression or neurological compromise.  She has multilevel degeneration disease with moderate disc degeneration.  Seen by neurology.  Recommended to continue physical therapy.  Vitamin B12 was 380, recommended to continue oral replacement.  Blood sugars are elevated, A1c 8.3.  Blood glucose 250 and more.  Jardiance  was stopped.  Will start patient on metformin  500 mg twice daily.  Low-dose glipizide  2.5 mg once daily.  We discussed about monitoring blood sugars at home and keeping a logbook of blood sugars as well monitoring for any low symptoms.  Patient is eager to go home.  Patient might have some peripheral neuropathy as evidenced by decreased proprioception on sensory evaluation, will send referral to neurology for outpatient  follow-up and EMG testings.  Acute UTI, ruled out.  Patient does not have new urinary symptoms.  She has some frequency of urination for many years.   Discharge Diagnoses:  Principal Problem:   Lower extremity weakness Active Problems:   Type 2 diabetes mellitus with hyperglycemia (HCC)   Chronic diastolic CHF (congestive heart failure) (HCC)   GERD without esophagitis   ADENOCARCINOMA, BREAST, RIGHT   Asthma   HLD (hyperlipidemia)   History of stroke   UTI (urinary tract infection)    Discharge Instructions  Discharge Instructions     Ambulatory referral to Neurology   Complete by: As directed    An appointment is requested in approximately: 4 weeks   Ambulatory referral to Physical Therapy   Complete by: As directed    Iontophoresis - 4 mg/ml of dexamethasone : No   T.E.N.S. Unit Evaluation and Dispense as Indicated: No   Diet - low sodium heart healthy   Complete by: As directed    Diet Carb Modified   Complete by: As directed    Discharge instructions   Complete by: As directed    Keep close monitoring of blood sugars at home. Also learn about low blood sugar symptoms and correcting it   Increase activity slowly   Complete by: As directed       Allergies as of 03/17/2024       Reactions   Ciprofloxacin  Nausea And Vomiting   Codeine Nausea And Vomiting   Doxycycline  Nausea And Vomiting   Oxycodone -acetaminophen  Nausea And Vomiting   Tramadol  Nausea And Vomiting        Medication List     STOP taking these medications    chlorpheniramine-HYDROcodone 10-8 MG/5ML Commonly known as: TUSSIONEX  ipratropium 0.03 % nasal spray Commonly known as: ATROVENT    ipratropium-albuterol  0.5-2.5 (3) MG/3ML Soln Commonly known as: DUONEB   Jardiance  10 MG Tabs tablet Generic drug: empagliflozin        TAKE these medications    albuterol  108 (90 Base) MCG/ACT inhaler Commonly known as: VENTOLIN  HFA Inhale 2 puffs into the lungs every 6 (six) hours as  needed. What changed: reasons to take this   amLODipine  5 MG tablet Commonly known as: NORVASC  Take 5 mg by mouth daily.   atorvastatin  80 MG tablet Commonly known as: LIPITOR  Take 1 tablet (80 mg total) by mouth daily at 6 PM.   clopidogrel  75 MG tablet Commonly known as: PLAVIX  Take 1 tablet (75 mg total) by mouth daily.   cyanocobalamin  1000 MCG tablet Take 1 tablet (1,000 mcg total) by mouth daily. Start taking on: March 18, 2024   DULoxetine  30 MG capsule Commonly known as: CYMBALTA  Take 30 mg by mouth daily.   glipiZIDE  2.5 MG 24 hr tablet Commonly known as: GLUCOTROL  XL Take 1 tablet (2.5 mg total) by mouth daily with breakfast.   isosorbide  mononitrate 30 MG 24 hr tablet Commonly known as: IMDUR  Take 30 mg by mouth daily.   metFORMIN  500 MG tablet Commonly known as: GLUCOPHAGE  Take 1 tablet (500 mg total) by mouth 2 (two) times daily with a meal.   montelukast  10 MG tablet Commonly known as: SINGULAIR  Take 1 tablet (10 mg total) by mouth at bedtime.   pantoprazole  40 MG tablet Commonly known as: PROTONIX  Take 30-60 min before first meal of the day What changed:  how much to take how to take this when to take this        Allergies  Allergen Reactions   Ciprofloxacin  Nausea And Vomiting   Codeine Nausea And Vomiting   Doxycycline  Nausea And Vomiting   Oxycodone -Acetaminophen  Nausea And Vomiting   Tramadol  Nausea And Vomiting    Consultations: Neurology   Procedures/Studies: MR Lumbar Spine W Wo Contrast Result Date: 03/17/2024 CLINICAL DATA:  83 year old female with lower extremity weakness. Cough and shortness of breath. EXAM: MRI LUMBAR SPINE WITHOUT AND WITH CONTRAST TECHNIQUE: Multiplanar and multiecho pulse sequences of the lumbar spine were obtained without and with intravenous contrast. CONTRAST:  7mL GADAVIST  GADOBUTROL  1 MMOL/ML IV SOLN COMPARISON:  Thoracic MRI today reported separately. Lumbar MRI 12/21/2023. FINDINGS: Segmentation:  Normal, concordant with the thoracic numbering today and the same numbering system used on the April MRI. Alignment: Stable since April. Maintained lumbar lordosis with subtle anterolisthesis of L3 on L4 and L4 on L5. Mild underlying dextroconvex lumbar scoliosis. Vertebrae: Normal background bone marrow signal in maintained vertebral height. Benign vertebral body hemangioma at L1 (normal variant). Intact visible sacrum and SI joints. No marrow edema or evidence of acute osseous abnormality. Conus medullaris and cauda equina: Conus extends to the T12-L1 level. No lower spinal cord or conus signal abnormality. Generally capacious spinal canal. Generally normal cauda equina nerve roots. No abnormal intradural enhancement. No dural thickening. Paraspinal and other soft tissues: Negative. Disc levels: L1-L2 and L2-L3:  Normal for age. L3-L4: Mild anterolisthesis. Circumferential disc/pseudo disc, predominantly far lateral. Moderate facet and ligament flavum hypertrophy. This level appears stable with no significant spinal stenosis or convincing neural impingement. L4-L5: Grade 1 anterolisthesis. Circumferential disc/pseudo disc asymmetric to the right. Moderate to severe ligament flavum, moderate facet hypertrophy. Increased degenerative facet joint fluid bilaterally (series 4, image 28). Mild spinal stenosis. Moderate right lateral recess stenosis (right L5 nerve level  series 4, image 28). Mild to moderate right L4 neural foraminal stenosis. Stenosis at this level is stable. L5-S1: Negative disc. Mild to moderate facet hypertrophy greater on the right. Stable mild right L5 foraminal stenosis. IMPRESSION: 1. Lumbar MRI stable since April, no acute or inflammatory findings. 2. Chronic grade 1 spondylolisthesis at L3-L4 and L4-L5 with degeneration asymmetric to the right. Isolated mild spinal stenosis at the latter. Moderate right L5 nerve level lateral recess stenosis. Mild right L4 and L5 foraminal stenosis. 3. Mild for  age lumbar degeneration otherwise. Electronically Signed   By: VEAR Hurst M.D.   On: 03/17/2024 04:19   MR THORACIC SPINE W WO CONTRAST Result Date: 03/17/2024 CLINICAL DATA:  83 year old female with lower extremity weakness. Cough and shortness of breath. EXAM: MRI THORACIC WITHOUT AND WITH CONTRAST TECHNIQUE: Multiplanar and multiecho pulse sequences of the thoracic spine were obtained without and with intravenous contrast. CONTRAST:  7mL GADAVIST  GADOBUTROL  1 MMOL/ML IV SOLN COMPARISON:  Cervical spine MRI 01/06/2008. CTA Chest 02/15/2024. FINDINGS: Limited cervical spine imaging: Similar to the 2009 MRI, with chronic C5-C6 and C6-C7 disc and endplate degeneration. Thoracic spine segmentation:  Appears normal. Alignment: Stable kyphosis from CTA last month. No significant scoliosis or spondylolisthesis. Vertebrae: Normal background bone marrow signal. Mild chronic T7 endplate compression is stable. Maintained vertebral height elsewhere. Flowing anterior endplate osteophytes resulting in interbody ankylosis T7 through T11 on the CTA last month. No marrow edema or evidence of acute osseous abnormality. Cord: Normal. No abnormal thoracic spinal cord signal, abnormal intradural enhancement, or dural thickening. And capacious thoracic spinal canal at most levels. Conus medullaris appears normal at T12-L1. Paraspinal and other soft tissues: Negative. Grossly negative visible chest and upper abdominal viscera. Disc levels: Normal for age T1-T2 through T4-T5. T5-T6 facet and ligament flavum hypertrophy, to a lesser extent at T6-T7. No spinal stenosis. Mild to moderate T5 foraminal stenosis greater on the right. T7 through T11 interbody ankylosis. Superimposed T7-T8 small left paracentral disc protrusion (series 25, image 23 and series 22, image 10). Mildly effaced ventral spinal cord there. No significant spinal stenosis at those levels despite some posterior element hypertrophy. T11-T12 moderate facet and ligament flavum  hypertrophy on the right. Moderate right T11 neural foraminal stenosis. T12-L1: Normal for age. IMPRESSION: 1. Generally mild for age Thoracic spine degeneration, superimposed on T7 through T11 interbody ankylosis. No spinal stenosis, abnormal spinal cord signal, and no acute or inflammatory process identified. 2. Intermittent thoracic posterior element degeneration. Up to moderate associated right T5 and T11 neural foraminal stenosis. 3. Chronic cervical spine disc and endplate degeneration. Electronically Signed   By: VEAR Hurst M.D.   On: 03/17/2024 04:12   CT Head Wo Contrast Result Date: 03/16/2024 CLINICAL DATA:  Neuro deficit, acute, stroke suspected. EXAM: CT HEAD WITHOUT CONTRAST TECHNIQUE: Contiguous axial images were obtained from the base of the skull through the vertex without intravenous contrast. RADIATION DOSE REDUCTION: This exam was performed according to the departmental dose-optimization program which includes automated exposure control, adjustment of the mA and/or kV according to patient size and/or use of iterative reconstruction technique. COMPARISON:  09/10/2023. FINDINGS: Brain: No acute intracranial hemorrhage, midline shift, or mass effect is seen. No extra-axial fluid collection. Mild diffuse atrophy is noted. Periventricular white matter hypodensities are present bilaterally. No hydrocephalus. Vascular: No hyperdense vessel or unexpected calcification. Skull: Normal. Negative for fracture or focal lesion. Sinuses/Orbits: Mucosal thickening is noted in the left maxillary sinus. No acute orbital abnormality. Other: None. IMPRESSION: 1. No acute  intracranial process. 2. Mild atrophy and chronic microvascular ischemic changes. Electronically Signed   By: Leita Birmingham M.D.   On: 03/16/2024 15:00   DG Chest 2 View Result Date: 03/16/2024 CLINICAL DATA:  Cough and shortness of breath. EXAM: CHEST - 2 VIEW COMPARISON:  02/15/2024. FINDINGS: The heart size and mediastinal contours are within  normal limits. There is atherosclerotic calcification of the aorta. Mild interstitial prominence is noted bilaterally no consolidation, effusion, or pneumothorax is seen. There are degenerative changes and scoliosis in the thoracic spine. Surgical lips are noted in the right chest wall and right upper quadrant. No acute osseous abnormality. IMPRESSION: No active cardiopulmonary disease. Electronically Signed   By: Leita Birmingham M.D.   On: 03/16/2024 14:55   (Echo, Carotid, EGD, Colonoscopy, ERCP)    Subjective: Patient seen in the morning rounds.  Examined again for discharge readiness.  Wants to go home. She does have frequency of urination which is chronic.  Does not have evidence of acute urinary infection.   Discharge Exam: Vitals:   03/17/24 1147 03/17/24 1547  BP: 133/63 132/60  Pulse: 75 99  Resp: 18 20  Temp: 99 F (37.2 C) 98.4 F (36.9 C)  SpO2: 97% 97%   Vitals:   03/17/24 0745 03/17/24 1147 03/17/24 1528 03/17/24 1547  BP: (!) 142/69 133/63  132/60  Pulse: 98 75  99  Resp: 20 18  20   Temp: 98.4 F (36.9 C) 99 F (37.2 C)  98.4 F (36.9 C)  TempSrc: Oral Oral  Oral  SpO2: 97% 97%  97%  Weight:   66.2 kg   Height:   5' 5 (1.651 m)     General: Pt is alert, awake, not in acute distress Cardiovascular: RRR, S1/S2 +, no rubs, no gallops Respiratory: CTA bilaterally, no wheezing, no rhonchi Abdominal: Soft, NT, ND, bowel sounds + Extremities: no edema, no cyanosis    The results of significant diagnostics from this hospitalization (including imaging, microbiology, ancillary and laboratory) are listed below for reference.     Microbiology: No results found for this or any previous visit (from the past 240 hours).   Labs: BNP (last 3 results) No results for input(s): BNP in the last 8760 hours. Basic Metabolic Panel: Recent Labs  Lab 03/16/24 1148 03/17/24 0719  NA 135 135  K 4.2 3.5  CL 100 97*  CO2 24 22  GLUCOSE 431* 262*  BUN 22 16  CREATININE  0.95 0.72  CALCIUM  10.0 9.5   Liver Function Tests: Recent Labs  Lab 03/16/24 1148 03/17/24 0719  AST 13* 15  ALT 26 26  ALKPHOS 93 70  BILITOT 0.7 0.7  PROT 6.9 7.1  ALBUMIN  4.2 3.7   No results for input(s): LIPASE, AMYLASE in the last 168 hours. No results for input(s): AMMONIA in the last 168 hours. CBC: Recent Labs  Lab 03/16/24 1148 03/17/24 0719  WBC 8.2 8.6  HGB 14.6 16.0*  HCT 41.8 44.9  MCV 88.9 89.1  PLT 241 231   Cardiac Enzymes: Recent Labs  Lab 03/16/24 1430  CKTOTAL 25*   BNP: Invalid input(s): POCBNP CBG: Recent Labs  Lab 03/16/24 1150 03/17/24 0039 03/17/24 0617 03/17/24 1149 03/17/24 1549  GLUCAP 388* 386* 320* 304* 242*   D-Dimer No results for input(s): DDIMER in the last 72 hours. Hgb A1c No results for input(s): HGBA1C in the last 72 hours. Lipid Profile No results for input(s): CHOL, HDL, LDLCALC, TRIG, CHOLHDL, LDLDIRECT in the last 72 hours. Thyroid  function  studies Recent Labs    03/17/24 0145  TSH 2.713   Anemia work up Recent Labs    03/17/24 0145 03/17/24 0719  VITAMINB12 380  --   FOLATE  --  11.4   Urinalysis    Component Value Date/Time   COLORURINE YELLOW 03/16/2024 1333   APPEARANCEUR CLEAR 03/16/2024 1333   LABSPEC 1.040 (H) 03/16/2024 1333   PHURINE 5.5 03/16/2024 1333   GLUCOSEU >1,000 (A) 03/16/2024 1333   HGBUR NEGATIVE 03/16/2024 1333   BILIRUBINUR NEGATIVE 03/16/2024 1333   KETONESUR NEGATIVE 03/16/2024 1333   PROTEINUR NEGATIVE 03/16/2024 1333   UROBILINOGEN 0.2 06/30/2014 1930   NITRITE POSITIVE (A) 03/16/2024 1333   LEUKOCYTESUR MODERATE (A) 03/16/2024 1333   Sepsis Labs Recent Labs  Lab 03/16/24 1148 03/17/24 0719  WBC 8.2 8.6   Microbiology No results found for this or any previous visit (from the past 240 hours).   Time coordinating discharge:  35 minutes  SIGNED:   Renato Applebaum, MD  [facility] 03/17/2024, 3:58 PM

## 2024-03-17 NOTE — Plan of Care (Signed)
  Problem: Clinical Measurements: Goal: Ability to maintain clinical measurements within normal limits will improve Outcome: Progressing Goal: Will remain free from infection Outcome: Progressing Goal: Diagnostic test results will improve Outcome: Progressing Goal: Respiratory complications will improve Outcome: Progressing Goal: Cardiovascular complication will be avoided Outcome: Progressing   Problem: Activity: Goal: Risk for activity intolerance will decrease Outcome: Progressing   Problem: Elimination: Goal: Will not experience complications related to bowel motility Outcome: Progressing Goal: Will not experience complications related to urinary retention Outcome: Progressing   Problem: Pain Managment: Goal: General experience of comfort will improve and/or be controlled Outcome: Progressing   Problem: Safety: Goal: Ability to remain free from injury will improve Outcome: Progressing   Problem: Skin Integrity: Goal: Risk for impaired skin integrity will decrease Outcome: Progressing

## 2024-03-17 NOTE — TOC CM/SW Note (Addendum)
 Pt has been DC. PT is recommending a RW. Pt reports that she doesn't have a preference for a DME agency. Will Contact [employer] for the DME referral and pt agreed with agency. Pt asked that they delivery the DME at home. Contacted Jermaine at [employer] and he accepted the referral. Informed Jermaine that pt wants the DME to be delivered home. She reports that the MD talked to her about outpt rehab and he gave her number for the rehab center. Ambulatory referral for PT sent by MD.

## 2024-03-17 NOTE — Care Management Obs Status (Signed)
 MEDICARE OBSERVATION STATUS NOTIFICATION   Patient Details  Name: [name] MRN: [id number] Date of Birth: Jul 07, 1941   Medicare Observation Status Notification Given:  Yes    Carletha Spruce, RN 03/17/2024, 10:53 AM

## 2024-03-17 NOTE — Evaluation (Signed)
 Physical Therapy Evaluation Patient Details Name: [name] MRN: [id number] DOB: 19-Mar-1941 Today's Date: 03/17/2024  History of Present Illness  The pt is an 83 yo female presenting 7/5 with SOB, LE pain, and multiple falls. Work up revealed CBG 431, HTN (BP 200/101), and possible UTI. PMH includes: CVA, DM II, HTN, HLD, asthma, GAD, GERD, and R-breast cancer. Pt with admission 6/5-8 due to hypoxia and asthma exacerbation.   Clinical Impression  Pt in bed upon arrival of PT, agreeable to evaluation at this time. Prior to admission the pt was ambulating without DME, but reports largely sedentary and limited to 10-20 ft at a time due to poor balance and fear of falling. The pt reports 4 falls since December 2024. The pt was able to complete sit-stand transfers with minA to steady without use of DME, but CGA with use of RW. She had single LOB without UE support needing modA to recover (no reactive step or attempt to correct LOB noted), but then only minA to CGA with RW even with balance challenges. Pt encouraged to use RW and shower chair to improve safety with daily tasks, is at high risk for continued falls and therefore recommend continued skilled PT to further address balance and balance reactions. Pt and spouse agreeable.          If plan is discharge home, recommend the following: A little help with walking and/or transfers;A little help with bathing/dressing/bathroom;Assistance with cooking/housework;Assist for transportation;Help with stairs or ramp for entrance   Can travel by private vehicle        Equipment Recommendations Rolling walker (2 wheels) (shower chair but spouse plans to buy)  Recommendations for Other Services       Functional Status Assessment Patient has had a recent decline in their functional status and demonstrates the ability to make significant improvements in function in a reasonable and predictable amount of time.     Precautions / Restrictions  Precautions Precautions: Fall Recall of Precautions/Restrictions: Intact Restrictions Weight Bearing Restrictions Per Provider Order: No      Mobility  Bed Mobility Overal bed mobility: Modified Independent             General bed mobility comments: slightly increased time, use of bed rails    Transfers Overall transfer level: Needs assistance Equipment used: None, 1 person hand held assist Transfers: Sit to/from Stand Sit to Stand: Min assist, Contact guard assist           General transfer comment: minA to steady without UE support, CGA with use of RW    Ambulation/Gait Ambulation/Gait assistance: Mod assist, Contact guard assist Gait Distance (Feet): 15 Feet (+ 200 ft + 50 ft) Assistive device: None, Rolling walker (2 wheels) Gait Pattern/deviations: Step-through pattern, Decreased stride length, Shuffle Gait velocity: 0.42 m/s Gait velocity interpretation: <1.8 ft/sec, indicate of risk for recurrent falls   General Gait Details: pt with single LOB when ambulating without UE support needing modA to recover (no reactive step or attempt to correct LOB). minA to CGA with RW even with balance challenges. mild drifting.  Stairs Stairs: Yes Stairs assistance: Contact guard assist Stair Management: Step to pattern, Forwards, One rail Right Number of Stairs: 2 General stair comments: 1 rail and HHA to minimic home set up     Balance Overall balance assessment: Needs assistance Sitting-balance support: No upper extremity supported, Feet supported Sitting balance-Leahy Scale: Fair     Standing balance support: Bilateral upper extremity supported, During functional activity, Reliant on  assistive device for balance Standing balance-Leahy Scale: Poor Standing balance comment: single LOB with gait without UE support, lateral LOB to R without attempt to correct LOB or any reactive step. improved step clearance and balance with BUE support                              Pertinent Vitals/Pain Pain Assessment Pain Assessment: No/denies pain    Home Living Family/patient expects to be discharged to:: Private residence Living Arrangements: Spouse/significant other Available Help at Discharge: Family;Available 24 hours/day Type of Home: House Home Access: Stairs to enter Entrance Stairs-Rails: Doctor, general practice of Steps: 3   Home Layout: One level Home Equipment: Agricultural consultant (2 wheels)      Prior Function Prior Level of Function : Independent/Modified Independent;Driving;History of Falls (last six months)             Mobility Comments: 4 falls since christmas, nop DME, no exercise, sedentary, mostly 10-20 ft walking at a time ADLs Comments: independent, spouse does IADLs     Extremity/Trunk Assessment   Upper Extremity Assessment Upper Extremity Assessment: Overall WFL for tasks assessed    Lower Extremity Assessment Lower Extremity Assessment: Generalized weakness (grossly 4/5 other than R knee 3+/5 and bilateral hip flexion 3+/5. pt reports no changes in sensation)    Cervical / Trunk Assessment Cervical / Trunk Assessment: Normal  Communication   Communication Communication: No apparent difficulties    Cognition Arousal: Alert Behavior During Therapy: WFL for tasks assessed/performed   PT - Cognitive impairments: No apparent impairments                         Following commands: Intact       Cueing Cueing Techniques: Verbal cues     General Comments General comments (skin integrity, edema, etc.): VSS on RA    Exercises     Assessment/Plan    PT Assessment Patient needs continued PT services  PT Problem List Decreased strength;Decreased range of motion;Decreased activity tolerance;Decreased balance;Decreased mobility;Decreased safety awareness       PT Treatment Interventions DME instruction;Gait training;Stair training;Functional mobility training;Therapeutic  activities;Therapeutic exercise;Balance training;Patient/family education    PT Goals (Current goals can be found in the Care Plan section)  Acute Rehab PT Goals Patient Stated Goal: return to independence with walking and improved balance, be able to cook a meal PT Goal Formulation: With patient Time For Goal Achievement: 03/31/24 Potential to Achieve Goals: Good    Frequency Min 2X/week        AM-PAC PT 6 Clicks Mobility  Outcome Measure Help needed turning from your back to your side while in a flat bed without using bedrails?: None Help needed moving from lying on your back to sitting on the side of a flat bed without using bedrails?: None Help needed moving to and from a bed to a chair (including a wheelchair)?: A Little Help needed standing up from a chair using your arms (e.g., wheelchair or bedside chair)?: A Little Help needed to walk in hospital room?: A Little Help needed climbing 3-5 steps with a railing? : A Little 6 Click Score: 20    End of Session Equipment Utilized During Treatment: Gait belt Activity Tolerance: Patient tolerated treatment well Patient left: in chair;with call bell/phone within reach;with family/visitor present Nurse Communication: Mobility status PT Visit Diagnosis: Unsteadiness on feet (R26.81);Other abnormalities of gait and mobility (R26.89);Repeated falls (R29.6);Muscle weakness (generalized) (  M62.81)    Time: 8553-8483 PT Time Calculation (min) (ACUTE ONLY): 30 min   Charges:   PT Evaluation $PT Eval Moderate Complexity: 1 Mod PT Treatments $Neuromuscular Re-education: 8-22 mins PT General Charges $$ ACUTE PT VISIT: 1 Visit         Izetta Call, PT, DPT   Acute Rehabilitation Department Office [phone] Secure Chat Communication Preferred  Izetta JULIANNA Call 03/17/2024, 3:25 PM

## 2024-03-19 LAB — PROTEIN ELECTROPHORESIS, SERUM
A/G Ratio: 1.2 (ref 0.7–1.7)
Albumin ELP: 3.9 g/dL (ref 2.9–4.4)
Alpha-1-Globulin: 0.1 g/dL (ref 0.0–0.4)
Alpha-2-Globulin: 1.1 g/dL — ABNORMAL HIGH (ref 0.4–1.0)
Beta Globulin: 1.4 g/dL — ABNORMAL HIGH (ref 0.7–1.3)
Gamma Globulin: 0.6 g/dL (ref 0.4–1.8)
Globulin, Total: 3.2 g/dL (ref 2.2–3.9)
Total Protein ELP: 7.1 g/dL (ref 6.0–8.5)

## 2024-03-20 LAB — VITAMIN B6: Vitamin B6: 5 ug/L (ref 3.4–65.2)

## 2024-03-21 DIAGNOSIS — E1122 Type 2 diabetes mellitus with diabetic chronic kidney disease: Secondary | ICD-10-CM | POA: Diagnosis not present

## 2024-03-21 DIAGNOSIS — R531 Weakness: Secondary | ICD-10-CM | POA: Diagnosis not present

## 2024-03-22 LAB — VITAMIN B1: Vitamin B1 (Thiamine): 152.6 nmol/L (ref 66.5–200.0)

## 2024-03-26 DIAGNOSIS — R300 Dysuria: Secondary | ICD-10-CM | POA: Diagnosis not present

## 2024-03-26 DIAGNOSIS — N390 Urinary tract infection, site not specified: Secondary | ICD-10-CM | POA: Diagnosis not present

## 2024-03-28 NOTE — Progress Notes (Deleted)
  Cardiology Office Note   Date:  03/28/2024  ID:  SAMARRA RIDGELY, DOB 03-07-1941, MRN [id number] PCP: Leonel Cole, MD  [facility] Providers Cardiologist:  Aleene Passe, MD { Click to update primary MD,subspecialty MD or APP then REFRESH:1}    History of Present Illness LOCKIE BOTHUN is a 83 y.o. female who with a past medical history of hypertension, hyperlipidemia, poorly controlled type 2 diabetes melitis and history of stroke here for hospital follow-up appointment.  History includes hospitalization 03/16/2024 for weakness in both lower extremities and decreased sensation occurring for the last 2 months with difficulty ambulating.  Recently walking less and also frequently falling.  Jardiance  was recently discontinued.  In the ER, hemodynamically stable.  Motor strength normal.  Found to have slight reduced proprioception on both lower extremities.  No other neurologic defects.  CT of the head, MRI of the thoracic and lumbar spine with and without contrast did not suggest any cord compression or neurologic compromise.  She has multilevel degeneration disease with multiple disc degeneration by neurology.  Recommended physical therapy.  Vitamin B12 was also low and recommended oral replacement.  Hemoglobin A1c was 8.3 and blood glucose was 250.  Jardiance  was stopped.  Planning to start patient on metformin  500 mg twice a day.  Low-dose glipizide  2.5 mg once daily.  Instructed to monitor sugars at home.  Acute UTI was ruled out.  Patient had some peripheral neuropathy as evident by decreased proprioception on sensory evaluation and was referred to neurology in the outpatient setting for follow-up EMG testing.  Today, she***  ROS: Pertinent ROS in HPI  Studies Reviewed      *** Risk Assessment/Calculations {Does this patient have ATRIAL FIBRILLATION?:[phone]} No BP recorded.  {Refresh Note OR Click here to enter BP  :1}***       Physical Exam VS:  There were no vitals taken for  this visit.       Wt Readings from Last 3 Encounters:  03/17/24 146 lb (66.2 kg)  02/15/24 150 lb (68 kg)  09/02/23 148 lb (67.1 kg)    GEN: Well nourished, well developed in no acute distress NECK: No JVD; No carotid bruits CARDIAC: ***RRR, no murmurs, rubs, gallops RESPIRATORY:  Clear to auscultation without rales, wheezing or rhonchi  ABDOMEN: Soft, non-tender, non-distended EXTREMITIES:  No edema; No deformity   ASSESSMENT AND PLAN Lower extremity weakness Type 2 diabetes mellitus HLD HTN    {Are you ordering a CV Procedure (e.g. stress test, cath, DCCV, TEE, etc)?   Press F2        :[id number]}  Dispo: ***  Signed, Orren LOISE Fabry, PA-C

## 2024-04-01 ENCOUNTER — Ambulatory Visit: Attending: Physician Assistant | Admitting: Physician Assistant

## 2024-04-01 DIAGNOSIS — Z79899 Other long term (current) drug therapy: Secondary | ICD-10-CM

## 2024-04-01 DIAGNOSIS — I10 Essential (primary) hypertension: Secondary | ICD-10-CM

## 2024-04-01 DIAGNOSIS — E1165 Type 2 diabetes mellitus with hyperglycemia: Secondary | ICD-10-CM

## 2024-04-01 DIAGNOSIS — E782 Mixed hyperlipidemia: Secondary | ICD-10-CM

## 2024-04-02 ENCOUNTER — Encounter: Payer: Self-pay | Admitting: Physician Assistant

## 2024-04-08 DIAGNOSIS — M7062 Trochanteric bursitis, left hip: Secondary | ICD-10-CM | POA: Diagnosis not present

## 2024-04-08 DIAGNOSIS — M533 Sacrococcygeal disorders, not elsewhere classified: Secondary | ICD-10-CM | POA: Diagnosis not present

## 2024-04-12 ENCOUNTER — Other Ambulatory Visit: Payer: Self-pay | Admitting: Family Medicine

## 2024-04-12 DIAGNOSIS — R1013 Epigastric pain: Secondary | ICD-10-CM | POA: Diagnosis not present

## 2024-04-12 DIAGNOSIS — R110 Nausea: Secondary | ICD-10-CM | POA: Diagnosis not present

## 2024-04-16 DIAGNOSIS — M1812 Unilateral primary osteoarthritis of first carpometacarpal joint, left hand: Secondary | ICD-10-CM | POA: Diagnosis not present

## 2024-04-17 NOTE — Therapy (Unsigned)
 OUTPATIENT PHYSICAL THERAPY LOWER EXTREMITY EVALUATION   Patient Name: [name] MRN: [id number] DOB:April 16, 1941, 83 y.o., female Today's Date: 04/18/2024  END OF SESSION:  PT End of Session - 04/18/24 1101     Visit Number 1    Number of Visits 12    Date for PT Re-Evaluation 05/16/24    Authorization Type [employer]    Authorization Time Period No auth    PT Start Time 1103    PT Stop Time 1144    PT Time Calculation (min) 41 min    Activity Tolerance Patient tolerated treatment well    Behavior During Therapy WFL for tasks assessed/performed          Past Medical History:  Diagnosis Date   Aortic atherosclerosis (HCC)    Asthma    Breast CA (HCC)    Cancer (HCC)    right breast mastectomy   CVA (cerebral vascular accident) (HCC) 04/27/2015   DDD (degenerative disc disease), cervical    Diabetes mellitus without complication (HCC)    Duodenal ulcer    External hemorrhoids    Generalized anxiety disorder    H/O chest pain 03/24/2006   normal nuclear  study   Headache    High cholesterol    Hypertension    Mild aortic insufficiency 03/28/2006   echo   Normal cardiac stress test 2005   Pancreatitis    Personal history of chemotherapy    Personal history of radiation therapy    PONV (postoperative nausea and vomiting)    Sinus bradycardia on ECG    Stroke [facility])    Past Surgical History:  Procedure Laterality Date   ABDOMINAL HYSTERECTOMY     APPENDECTOMY     30 years ago   childbirth     x 2   CHOLECYSTECTOMY     15 years ago   ESOPHAGOGASTRODUODENOSCOPY N/A 03/04/2014   Procedure: ESOPHAGOGASTRODUODENOSCOPY (EGD);  Surgeon: Belvie JONETTA Just, MD;  Location: [facility] ENDOSCOPY;  Service: Endoscopy;  Laterality: N/A;   MASTECTOMY  1993   right breast   RIGHT/LEFT HEART CATH AND CORONARY ANGIOGRAPHY N/A 05/30/2019   Procedure: RIGHT/LEFT HEART CATH AND CORONARY ANGIOGRAPHY;  Surgeon: Dann Candyce RAMAN, MD;  Location: [facility] INVASIVE CV LAB;   Service: Cardiovascular;  Laterality: N/A;   TUBAL LIGATION     Patient Active Problem List   Diagnosis Date Noted   UTI (urinary tract infection) 03/17/2024   Lower extremity weakness 03/16/2024   Infection due to parainfluenza virus 3 02/17/2024   GAD (generalized anxiety disorder) 02/16/2024   Acute bronchitis 02/15/2024   RSV (respiratory syncytial virus pneumonia) 10/03/2022   Fracture of proximal phalanx of lesser toe of right foot 10/02/2022   Chronic diastolic CHF (congestive heart failure) (HCC) 10/02/2022   Lactic acidosis 10/02/2022   Mixed hyperlipidemia due to type 2 diabetes mellitus (HCC) 10/02/2022   Antibiotic reaction, initial encounter 10/02/2022   Abnormal CT of the chest 10/02/2022   Dehydration with hyponatremia 10/01/2022   Type 2 diabetes mellitus with hyperglycemia (HCC) 10/01/2022   Bronchitis 05/25/2021   Asthma, chronic, unspecified asthma severity, with acute exacerbation 05/25/2021   Angina pectoris (HCC)    Arthritis of carpometacarpal (CMC) joint of left thumb 10/03/2017   De Quervain's tenosynovitis, left 10/03/2017   History of stroke 06/13/2017   DOE (dyspnea on exertion) 05/24/2017   Occipital neuralgia of right side 11/02/2016   Palpitations 06/17/2015   Cerebrovascular accident (CVA) due to occlusion of left middle cerebral artery (HCC) 06/17/2015  Hyperlipidemia 06/17/2015   HLD (hyperlipidemia)    Intracranial vascular stenosis    Cerebral infarction due to occlusion of left middle cerebral artery (HCC)    Occipital headache    Extrinsic asthma 07/02/2014   Migraine variant with status migrainosus 07/02/2014   Benign paroxysmal positional vertigo 07/02/2014   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/2014   Hypertension associated with diabetes (HCC) 03/03/2014   Mild aortic insufficiency    H/O chest pain    Sinus bradycardia on ECG    GERD without esophagitis 03/24/2008   ADENOCARCINOMA, BREAST, RIGHT 06/30/2007   Asthma 06/30/2007    Upper airway cough syndrome 06/30/2007    PCP: Leonel Cole, MD  REFERRING PROVIDER: Leonel Cole, MD  REFERRING DIAG: R53.1 (ICD-10-CM) - Weakness  THERAPY DIAG:  Other abnormalities of gait and mobility  Muscle weakness (generalized)  Repeated falls  Rationale for Evaluation and Treatment: Rehabilitation  ONSET DATE: December 2024  SUBJECTIVE:   SUBJECTIVE STATEMENT: [name], doesn't get around good, and has had falls. Reports MD wants her hip to be worked on. Clemens down a flight of steps, in December. Broke her tail bone, crack in her L hip, and broke a rib. Injection hasn't helped much. Her biggest concern is her hip pain, balance, and LE weakness. Reports hip pain is mostly during transfers. Got two injections a week ago.   PERTINENT HISTORY: Cerv DDD CVA 2016  PAIN:  Are you having pain? No  PRECAUTIONS: None  RED FLAGS: None   WEIGHT BEARING RESTRICTIONS: No  FALLS:  Has patient fallen in last 6 months? Yes. Number of falls 4  LIVING ENVIRONMENT: Lives with: lives with their spouse Lives in: House/apartment Stairs: Yes: External: 3 steps; bilateral but cannot reach both Has following equipment at home: Single point cane and Walker - 2 wheeled  OCCUPATION: Retired, Passenger transport manager  PLOF: Independent  PATIENT GOALS:   NEXT MD VISIT: No follow up set up  OBJECTIVE:  Note: Objective measures were completed at Evaluation unless otherwise noted.  DIAGNOSTIC FINDINGS:  IMPRESSION: 1. Lumbar MRI stable since April, no acute or inflammatory findings. 2. Chronic grade 1 spondylolisthesis at L3-L4 and L4-L5 with degeneration asymmetric to the right. Isolated mild spinal stenosis at the latter. Moderate right L5 nerve level lateral recess stenosis. Mild right L4 and L5 foraminal stenosis. 3. Mild for age lumbar degeneration otherwise.  PATIENT SURVEYS:  ABC scale: The Activities-Specific Balance Confidence (ABC) Scale:   Total ABC  score: 1360 / 1600 = 85.0 % 0% 10 20 30  40 50 60 70 80 90 100% No confidence<->completely confident    Lower Extremity Functional Score: 50 / 80 = 62.5 %  COGNITION: Overall cognitive status: Within functional limits for tasks assessed     SENSATION: WFL   POSTURE: rounded shoulders, forward head, and increased thoracic kyphosis  PALPATION: TTP in L ischium area and L sacral area Unremarkable low back palpation  LOWER EXTREMITY ROM:   ROM Right eval Left eval  Hip flexion    Hip extension    Hip abduction    Hip adduction    Hip internal rotation    Hip external rotation    Knee flexion    Knee extension    Ankle dorsiflexion    Ankle plantarflexion    Ankle inversion    Ankle eversion     (Blank rows = not tested)  LOWER EXTREMITY MMT:  MMT Right eval Left eval  Hip flexion 3+ 3+  Hip  extension 3+ 3+  Hip abduction 4- (seated) 4- (seated)  Hip adduction 4 (seated) 4 (seated)  Hip internal rotation    Hip external rotation    Knee flexion    Knee extension    Ankle dorsiflexion 4 4  Ankle plantarflexion    Ankle inversion    Ankle eversion     (Blank rows = not tested)    FUNCTIONAL TESTS:  30 seconds chair stand test Timed up and go (TUG): 18  30 sec chair stand: 5.5, one instance of LOB with first STS  Four Stage Balance:  Feet side by side: 30 Semi tandem: 30 w/ each LE, leans to L Full Tandem: R-30  L-24 SLS: 3 each LE  GAIT: Distance walked: 100 Assistive device utilized: None Level of assistance: Complete Independence Comments: Mild shuffling at times, Dec step bilat, Dec stance on LLE, dec trunk rotation and arm swing                                                                                                                                TREATMENT DATE:  04/18/24: PT Eval and HEP    PATIENT EDUCATION:  Education details: PT evaluation, objective findings, POC, Importance of HEP, Precautions, Clinic policies  Person  educated: Patient Education method: Explanation and Demonstration Education comprehension: verbalized understanding and returned demonstration  HOME EXERCISE PROGRAM: Access Code: [email or web address] URL: [email or web address] Date: 04/18/2024 Prepared by: Rosaria Powell-Butler  Exercises - Supine Bridge  - 2 x daily - 7 x weekly - 2 sets - 10 reps - Clamshell  - 2 x daily - 7 x weekly - 2 sets - 10 reps - Standing Tandem Balance with Counter Support  - 2 x daily - 7 x weekly - 3 sets - 1 min hold - Single Leg Stance with Support  - 2 x daily - 7 x weekly - 3 sets - 30 hold  ASSESSMENT:  CLINICAL IMPRESSION: Patient is a 83 y.o. female who was seen today for physical therapy evaluation and treatment for R53.1 (ICD-10-CM) - Weakness. Patient demonstrates decreased self perceived function via ABC scale and LEFS, decreased LE MMT, impaired balance, altered gait, and increased fall risk via TUG, all of which may be contributing to patient's impaired function. Patient will benefit from continued skilled physical therapy in order to address the above to improve function and quality of life.    OBJECTIVE IMPAIRMENTS: Abnormal gait, decreased activity tolerance, decreased balance, decreased endurance, decreased strength, improper body mechanics, and postural dysfunction.   ACTIVITY LIMITATIONS: carrying, lifting, bending, standing, squatting, stairs, and transfers  PARTICIPATION LIMITATIONS: meal prep, cleaning, laundry, community activity, and yard work  PERSONAL FACTORS: Age are also affecting patient's functional outcome.   REHAB POTENTIAL: Good  CLINICAL DECISION MAKING: Stable/uncomplicated  EVALUATION COMPLEXITY: Low   GOALS: Goals reviewed with patient? No  SHORT TERM GOALS: Target date: 05/09/24 Patient will be independent with performance of HEP to demonstrate adequate  self management of symptoms.  Baseline:  Goal status: INITIAL  2.   Patient will report at least a 25%  improvement with function or pain overall since beginning PT. Baseline:  Goal status: INITIAL  LONG TERM GOALS: Target date: 05/30/24 Patient will improve LEFS score by 10 points to demonstrate improved perceived function while meeting MCID.  Baseline: Goal status: INITIAL 2. Patient will improve ABC Scale score by 10% to demonstrate improved perceived function while meeting MCID.   Baseline:  Goal status: INITIAL 3.  Patient will improve 30 second chair stand test by at least 3 STS in order to demonstrate improved LE strength and endurance needed for community ambulation.  Baseline:  Goal status: INITIAL   4.  Patient will perform TUG in 12 seconds or less in order to demonstrate decreased risk of fall.  Baseline: Goal status: INITIAL     PLAN:  PT FREQUENCY: 2x/week  PT DURATION: 6 weeks  PLANNED INTERVENTIONS: 97164- PT Re-evaluation, 97110-Therapeutic exercises, 97530- Therapeutic activity, [id number]- Neuromuscular re-education, 97535- Self Care, 02859- Manual therapy, [id number]- Gait training, [id number]- Electrical stimulation (manual), [id number]- Traction (mechanical), [phone] (1-2 muscles), 20561 (3+ muscles)- Dry Needling, Patient/Family education, Balance training, Stair training, Taping, Joint mobilization, Spinal mobilization, Cryotherapy, and Moist heat  PLAN FOR NEXT SESSION: Review HEP and goals, DGI or FGA, prog LE strengthening, prog balance challenge   11:50 AM, 04/18/24 Rosaria Settler, PT, DPT [facility] [place]

## 2024-04-18 ENCOUNTER — Ambulatory Visit (HOSPITAL_COMMUNITY): Attending: Family Medicine

## 2024-04-18 ENCOUNTER — Encounter (HOSPITAL_COMMUNITY): Payer: Self-pay

## 2024-04-18 ENCOUNTER — Other Ambulatory Visit: Payer: Self-pay

## 2024-04-18 DIAGNOSIS — R296 Repeated falls: Secondary | ICD-10-CM | POA: Diagnosis not present

## 2024-04-18 DIAGNOSIS — R2689 Other abnormalities of gait and mobility: Secondary | ICD-10-CM | POA: Insufficient documentation

## 2024-04-18 DIAGNOSIS — M6281 Muscle weakness (generalized): Secondary | ICD-10-CM | POA: Insufficient documentation

## 2024-04-19 ENCOUNTER — Ambulatory Visit
Admission: RE | Admit: 2024-04-19 | Discharge: 2024-04-19 | Disposition: A | Discharge status: Home / Self Care | Source: Ambulatory Visit | Attending: Family Medicine | Admitting: Family Medicine

## 2024-04-19 DIAGNOSIS — R1013 Epigastric pain: Secondary | ICD-10-CM

## 2024-04-19 MED ORDER — IOPAMIDOL (ISOVUE-300) INJECTION 61%
100.0000 mL | Freq: Once | INTRAVENOUS | Status: AC | PRN
  Administered 2024-04-19: 100 mL via INTRAVENOUS

## 2024-04-25 ENCOUNTER — Other Ambulatory Visit: Payer: HMO

## 2024-04-25 ENCOUNTER — Ambulatory Visit (HOSPITAL_COMMUNITY)

## 2024-04-25 DIAGNOSIS — M6281 Muscle weakness (generalized): Secondary | ICD-10-CM

## 2024-04-25 DIAGNOSIS — R2689 Other abnormalities of gait and mobility: Secondary | ICD-10-CM

## 2024-04-25 DIAGNOSIS — R296 Repeated falls: Secondary | ICD-10-CM

## 2024-04-25 NOTE — Therapy (Signed)
 OUTPATIENT PHYSICAL THERAPY LOWER EXTREMITY TREATMENT   Patient Name: [name] MRN: [id number] DOB:1941/06/01, 83 y.o., female Today's Date: 04/25/2024  END OF SESSION:  PT End of Session - 04/25/24 0932     Visit Number 2    Number of Visits 12    Date for PT Re-Evaluation 05/16/24    Authorization Type [employer]    Authorization Time Period No auth    PT Start Time 0932    PT Stop Time 1012    PT Time Calculation (min) 40 min    Activity Tolerance Patient tolerated treatment well    Behavior During Therapy [facility] for tasks assessed/performed          Past Medical History:  Diagnosis Date   Aortic atherosclerosis (HCC)    Asthma    Breast CA (HCC)    Cancer (HCC)    right breast mastectomy   CVA (cerebral vascular accident) (HCC) 04/27/2015   DDD (degenerative disc disease), cervical    Diabetes mellitus without complication (HCC)    Duodenal ulcer    External hemorrhoids    Generalized anxiety disorder    H/O chest pain 03/24/2006   normal nuclear  study   Headache    High cholesterol    Hypertension    Mild aortic insufficiency 03/28/2006   echo   Normal cardiac stress test 2005   Pancreatitis    Personal history of chemotherapy    Personal history of radiation therapy    PONV (postoperative nausea and vomiting)    Sinus bradycardia on ECG    Stroke [facility])    Past Surgical History:  Procedure Laterality Date   ABDOMINAL HYSTERECTOMY     APPENDECTOMY     30 years ago   childbirth     x 2   CHOLECYSTECTOMY     15 years ago   ESOPHAGOGASTRODUODENOSCOPY N/A 03/04/2014   Procedure: ESOPHAGOGASTRODUODENOSCOPY (EGD);  Surgeon: Belvie JONETTA Just, MD;  Location: [facility] ENDOSCOPY;  Service: Endoscopy;  Laterality: N/A;   MASTECTOMY  1993   right breast   RIGHT/LEFT HEART CATH AND CORONARY ANGIOGRAPHY N/A 05/30/2019   Procedure: RIGHT/LEFT HEART CATH AND CORONARY ANGIOGRAPHY;  Surgeon: Dann Candyce RAMAN, MD;  Location: [facility] INVASIVE CV LAB;   Service: Cardiovascular;  Laterality: N/A;   TUBAL LIGATION     Patient Active Problem List   Diagnosis Date Noted   UTI (urinary tract infection) 03/17/2024   Lower extremity weakness 03/16/2024   Infection due to parainfluenza virus 3 02/17/2024   GAD (generalized anxiety disorder) 02/16/2024   Acute bronchitis 02/15/2024   RSV (respiratory syncytial virus pneumonia) 10/03/2022   Fracture of proximal phalanx of lesser toe of right foot 10/02/2022   Chronic diastolic CHF (congestive heart failure) (HCC) 10/02/2022   Lactic acidosis 10/02/2022   Mixed hyperlipidemia due to type 2 diabetes mellitus (HCC) 10/02/2022   Antibiotic reaction, initial encounter 10/02/2022   Abnormal CT of the chest 10/02/2022   Dehydration with hyponatremia 10/01/2022   Type 2 diabetes mellitus with hyperglycemia (HCC) 10/01/2022   Bronchitis 05/25/2021   Asthma, chronic, unspecified asthma severity, with acute exacerbation 05/25/2021   Angina pectoris (HCC)    Arthritis of carpometacarpal (CMC) joint of left thumb 10/03/2017   De Quervain's tenosynovitis, left 10/03/2017   History of stroke 06/13/2017   DOE (dyspnea on exertion) 05/24/2017   Occipital neuralgia of right side 11/02/2016   Palpitations 06/17/2015   Cerebrovascular accident (CVA) due to occlusion of left middle cerebral artery (HCC) 06/17/2015  Hyperlipidemia 06/17/2015   HLD (hyperlipidemia)    Intracranial vascular stenosis    Cerebral infarction due to occlusion of left middle cerebral artery (HCC)    Occipital headache    Extrinsic asthma 07/02/2014   Migraine variant with status migrainosus 07/02/2014   Benign paroxysmal positional vertigo 07/02/2014   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/2014   Hypertension associated with diabetes (HCC) 03/03/2014   Mild aortic insufficiency    H/O chest pain    Sinus bradycardia on ECG    GERD without esophagitis 03/24/2008   ADENOCARCINOMA, BREAST, RIGHT 06/30/2007   Asthma 06/30/2007    Upper airway cough syndrome 06/30/2007    PCP: Leonel Cole, MD  REFERRING PROVIDER: Leonel Cole, MD  REFERRING DIAG: R53.1 (ICD-10-CM) - Weakness  THERAPY DIAG:  Other abnormalities of gait and mobility  Muscle weakness (generalized)  Repeated falls  Rationale for Evaluation and Treatment: Rehabilitation  ONSET DATE: December 2024  SUBJECTIVE:   SUBJECTIVE STATEMENT: Feeling pretty good today; overall about the same.    Eval:[name], doesn't get around good, and has had falls. Reports MD wants her hip to be worked on. Clemens down a flight of steps, in December. Broke her tail bone, crack in her L hip, and broke a rib. Injection hasn't helped much. Her biggest concern is her hip pain, balance, and LE weakness. Reports hip pain is mostly during transfers. Got two injections a week ago.   PERTINENT HISTORY: Cerv DDD CVA 2016  PAIN:  Are you having pain? No  PRECAUTIONS: None  RED FLAGS: None   WEIGHT BEARING RESTRICTIONS: No  FALLS:  Has patient fallen in last 6 months? Yes. Number of falls 4  LIVING ENVIRONMENT: Lives with: lives with their spouse Lives in: House/apartment Stairs: Yes: External: 3 steps; bilateral but cannot reach both Has following equipment at home: Single point cane and Walker - 2 wheeled  OCCUPATION: Retired, Passenger transport manager  PLOF: Independent  PATIENT GOALS:   NEXT MD VISIT: No follow up set up  OBJECTIVE:  Note: Objective measures were completed at Evaluation unless otherwise noted.  DIAGNOSTIC FINDINGS:  IMPRESSION: 1. Lumbar MRI stable since April, no acute or inflammatory findings. 2. Chronic grade 1 spondylolisthesis at L3-L4 and L4-L5 with degeneration asymmetric to the right. Isolated mild spinal stenosis at the latter. Moderate right L5 nerve level lateral recess stenosis. Mild right L4 and L5 foraminal stenosis. 3. Mild for age lumbar degeneration otherwise.  PATIENT SURVEYS:  ABC scale: The  Activities-Specific Balance Confidence (ABC) Scale:   Total ABC score: 1360 / 1600 = 85.0 % 0% 10 20 30  40 50 60 70 80 90 100% No confidence<->completely confident    Lower Extremity Functional Score: 50 / 80 = 62.5 %  COGNITION: Overall cognitive status: Within functional limits for tasks assessed     SENSATION: WFL   POSTURE: rounded shoulders, forward head, and increased thoracic kyphosis  PALPATION: TTP in L ischium area and L sacral area Unremarkable low back palpation  LOWER EXTREMITY ROM:   ROM Right eval Left eval  Hip flexion    Hip extension    Hip abduction    Hip adduction    Hip internal rotation    Hip external rotation    Knee flexion    Knee extension    Ankle dorsiflexion    Ankle plantarflexion    Ankle inversion    Ankle eversion     (Blank rows = not tested)  LOWER EXTREMITY MMT:  MMT  Right eval Left eval  Hip flexion 3+ 3+  Hip extension 3+ 3+  Hip abduction 4- (seated) 4- (seated)  Hip adduction 4 (seated) 4 (seated)  Hip internal rotation    Hip external rotation    Knee flexion    Knee extension    Ankle dorsiflexion 4 4  Ankle plantarflexion    Ankle inversion    Ankle eversion     (Blank rows = not tested)    FUNCTIONAL TESTS:  30 seconds chair stand test Timed up and go (TUG): 18  30 sec chair stand: 5.5, one instance of LOB with first STS  Four Stage Balance:  Feet side by side: 30 Semi tandem: 30 w/ each LE, leans to L Full Tandem: R-30  L-24 SLS: 3 each LE  GAIT: Distance walked: 100 Assistive device utilized: None Level of assistance: Complete Independence Comments: Mild shuffling at times, Dec step bilat, Dec stance on LLE, dec trunk rotation and arm swing                                                                                                                                TREATMENT DATE:  04/25/24 Review of HEP and goals DGI 1. Gait level surface (2) Mild Impairment: Walks 20', uses  assistive devices, slower speed, mild gait deviations. 2. Change in gait speed (2) Mild Impairment: Is able to change speed but demonstrates mild gait deviations, or not gait deviations but unable to achieve a significant change in velocity, or uses an assistive device. 3. Gait with horizontal head turns (1) Moderate Impairment: Performs head turns with moderate change in gait velocity, slows down, staggers but recovers, can continue to walk. 4. Gait with vertical head turns 1) Moderate Impairment: Performs head turns with moderate change in gait velocity, slows down, staggers but recovers, can continue to walk. 5. Gait and pivot turn (2) Mild Impairment: Pivot turns safely in > 3 seconds and stops with no loss of balance. 6. Step over obstacle (1) Moderate Impairment: Is able to step over box but must stop, then step over. May require verbal cueing. 7. Step around obstacles (2) Mild Impairment: Is able to step around both cones, but must slow down and adjust steps to clear cones. 8. Stairs (1) Moderate Impairment: Two feet to a stair, must use rail.  TOTAL SCORE: 12 / 24 Nustep seat 8 x 5' for general strengthening and endurance Standing Heel raises 2 x 10 Hip vectors 2 x 8 each Sit to stand x 10 no UE assist Updated HEP      04/18/24: PT Eval and HEP    PATIENT EDUCATION:  Education details: PT evaluation, objective findings, POC, Importance of HEP, Precautions, Clinic policies  Person educated: Patient Education method: Explanation and Demonstration Education comprehension: verbalized understanding and returned demonstration  HOME EXERCISE PROGRAM: Access Code: [email or web address] URL: [email or web address] Date: 04/18/2024 Prepared by: Rosaria Powell-Butler  Exercises - Supine Bridge  - 2  x daily - 7 x weekly - 2 sets - 10 reps - Clamshell  - 2 x daily - 7 x weekly - 2 sets - 10 reps - Standing Tandem Balance with Counter Support  - 2 x daily - 7 x weekly - 3 sets -  1 min hold - Single Leg Stance with Support  - 2 x daily - 7 x weekly - 3 sets - 30 hold  ASSESSMENT:  CLINICAL IMPRESSION: Today's session starts with a review of HEP and goals.  Patient verbalizes agreement with set rehab goals.  DGI performed demonstrating imbalance with functional gait.  She is mildly short of breath after dgi.  She reports general weakness during session and needs occasional rest breaks.  Nustep for general strengthening and endurance.  Added hip vectors and she needs cues to avoid trunk substitution.  Updated HEP.  Patient will benefit from continued skilled therapy services to address deficits and promote return to optimal function.       Eval: Patient is a 83 y.o. female who was seen today for physical therapy evaluation and treatment for R53.1 (ICD-10-CM) - Weakness. Patient demonstrates decreased self perceived function via ABC scale and LEFS, decreased LE MMT, impaired balance, altered gait, and increased fall risk via TUG, all of which may be contributing to patient's impaired function. Patient will benefit from continued skilled physical therapy in order to address the above to improve function and quality of life.    OBJECTIVE IMPAIRMENTS: Abnormal gait, decreased activity tolerance, decreased balance, decreased endurance, decreased strength, improper body mechanics, and postural dysfunction.   ACTIVITY LIMITATIONS: carrying, lifting, bending, standing, squatting, stairs, and transfers  PARTICIPATION LIMITATIONS: meal prep, cleaning, laundry, community activity, and yard work  PERSONAL FACTORS: Age are also affecting patient's functional outcome.   REHAB POTENTIAL: Good  CLINICAL DECISION MAKING: Stable/uncomplicated  EVALUATION COMPLEXITY: Low   GOALS: Goals reviewed with patient? No  SHORT TERM GOALS: Target date: 05/09/24 Patient will be independent with performance of HEP to demonstrate adequate self management of symptoms.  Baseline:  Goal status:  in progress  2.   Patient will report at least a 25% improvement with function or pain overall since beginning PT. Baseline:  Goal status: in progress  LONG TERM GOALS: Target date: 05/30/24 Patient will improve LEFS score by 10 points to demonstrate improved perceived function while meeting MCID.  Baseline: Goal status: in progress 2. Patient will improve ABC Scale score by 10% to demonstrate improved perceived function while meeting MCID.   Baseline:  Goal status: in progress 3.  Patient will improve 30 second chair stand test by at least 3 STS in order to demonstrate improved LE strength and endurance needed for community ambulation.  Baseline:  Goal status: in progress   4.  Patient will perform TUG in 12 seconds or less in order to demonstrate decreased risk of fall.  Baseline: Goal status: in progress     PLAN:  PT FREQUENCY: 2x/week  PT DURATION: 6 weeks  PLANNED INTERVENTIONS: 97164- PT Re-evaluation, 97110-Therapeutic exercises, 97530- Therapeutic activity, 97112- Neuromuscular re-education, 97535- Self Care, [id number]- Manual therapy, [id number]- Gait training, [phone]- Electrical stimulation (manual), [id number]- Traction (mechanical), [phone] (1-2 muscles), 20561 (3+ muscles)- Dry Needling, Patient/Family education, Balance training, Stair training, Taping, Joint mobilization, Spinal mobilization, Cryotherapy, and Moist heat  PLAN FOR NEXT SESSION:   prog LE strengthening, prog balance challenge  10:12 AM, 04/25/24 Viyaan Champine Small Avary Eichenberger MPT [facility] [place] [phone] Ph:[phone]

## 2024-05-01 DIAGNOSIS — T148XXA Other injury of unspecified body region, initial encounter: Secondary | ICD-10-CM | POA: Diagnosis not present

## 2024-05-01 DIAGNOSIS — L821 Other seborrheic keratosis: Secondary | ICD-10-CM | POA: Diagnosis not present

## 2024-05-01 DIAGNOSIS — L218 Other seborrheic dermatitis: Secondary | ICD-10-CM | POA: Diagnosis not present

## 2024-05-02 ENCOUNTER — Ambulatory Visit (HOSPITAL_BASED_OUTPATIENT_CLINIC_OR_DEPARTMENT_OTHER)
Admission: RE | Admit: 2024-05-02 | Discharge: 2024-05-02 | Disposition: A | Discharge status: Home / Self Care | Source: Ambulatory Visit | Attending: Family Medicine | Admitting: Family Medicine

## 2024-05-02 DIAGNOSIS — Z13820 Encounter for screening for osteoporosis: Secondary | ICD-10-CM | POA: Insufficient documentation

## 2024-05-02 DIAGNOSIS — M810 Age-related osteoporosis without current pathological fracture: Secondary | ICD-10-CM | POA: Insufficient documentation

## 2024-05-02 DIAGNOSIS — Z780 Asymptomatic menopausal state: Secondary | ICD-10-CM | POA: Diagnosis not present

## 2024-05-07 DIAGNOSIS — K432 Incisional hernia without obstruction or gangrene: Secondary | ICD-10-CM | POA: Diagnosis not present

## 2024-05-07 DIAGNOSIS — R634 Abnormal weight loss: Secondary | ICD-10-CM | POA: Diagnosis not present

## 2024-05-07 DIAGNOSIS — R110 Nausea: Secondary | ICD-10-CM | POA: Diagnosis not present

## 2024-05-08 ENCOUNTER — Ambulatory Visit (HOSPITAL_COMMUNITY)

## 2024-05-08 ENCOUNTER — Encounter (HOSPITAL_COMMUNITY): Payer: Self-pay

## 2024-05-08 ENCOUNTER — Telehealth: Payer: Self-pay

## 2024-05-08 DIAGNOSIS — R2689 Other abnormalities of gait and mobility: Secondary | ICD-10-CM | POA: Diagnosis not present

## 2024-05-08 DIAGNOSIS — R296 Repeated falls: Secondary | ICD-10-CM

## 2024-05-08 DIAGNOSIS — M6281 Muscle weakness (generalized): Secondary | ICD-10-CM

## 2024-05-08 NOTE — Telephone Encounter (Signed)
  Patient Consent for Virtual Visit        [name] has provided verbal consent on 05/08/2024 for a virtual visit (video or telephone).   CONSENT FOR VIRTUAL VISIT FOR:  [name]  By participating in this virtual visit I agree to the following:  I hereby voluntarily request, consent and authorize [facility] and its employed or contracted physicians, physician assistants, nurse practitioners or other licensed health care professionals (the Practitioner), to provide me with telemedicine health care services (the "Services) as deemed necessary by the treating Practitioner. I acknowledge and consent to receive the Services by the Practitioner via telemedicine. I understand that the telemedicine visit will involve communicating with the Practitioner through live audiovisual communication technology and the disclosure of certain medical information by electronic transmission. I acknowledge that I have been given the opportunity to request an in-person assessment or other available alternative prior to the telemedicine visit and am voluntarily participating in the telemedicine visit.  I understand that I have the right to withhold or withdraw my consent to the use of telemedicine in the course of my care at any time, without affecting my right to future care or treatment, and that the Practitioner or I may terminate the telemedicine visit at any time. I understand that I have the right to inspect all information obtained and/or recorded in the course of the telemedicine visit and may receive copies of available information for a reasonable fee.  I understand that some of the potential risks of receiving the Services via telemedicine include:  Delay or interruption in medical evaluation due to technological equipment failure or disruption; Information transmitted may not be sufficient (e.g. poor resolution of images) to allow for appropriate medical decision making by the Practitioner;  and/or  In rare instances, security protocols could fail, causing a breach of personal health information.  Furthermore, I acknowledge that it is my responsibility to provide information about my medical history, conditions and care that is complete and accurate to the best of my ability. I acknowledge that Practitioner's advice, recommendations, and/or decision may be based on factors not within their control, such as incomplete or inaccurate data provided by me or distortions of diagnostic images or specimens that may result from electronic transmissions. I understand that the practice of medicine is not an exact science and that Practitioner makes no warranties or guarantees regarding treatment outcomes. I acknowledge that a copy of this consent can be made available to me via my patient portal [facility] MyChart), or I can request a printed copy by calling the office of [facility].    I understand that my insurance will be billed for this visit.   I have read or had this consent read to me. I understand the contents of this consent, which adequately explains the benefits and risks of the Services being provided via telemedicine.  I have been provided ample opportunity to ask questions regarding this consent and the Services and have had my questions answered to my satisfaction. I give my informed consent for the services to be provided through the use of telemedicine in my medical care

## 2024-05-08 NOTE — Telephone Encounter (Signed)
   Name: [name]  DOB: [birth year]  MRN: [id number]  Primary Cardiologist: Aleene Passe, MD (Inactive)   Preoperative team, please contact this patient and set up a phone call appointment for further preoperative risk assessment. Please obtain consent and complete medication review. Thank you for your help.  I confirm that guidance regarding antiplatelet and oral anticoagulation therapy has been completed and, if necessary, noted below.  Guidance for holding Plavix  will need to come from prescribing provider and is currently not managed by cardiology.  I also confirmed the patient resides in the state of  . As per [facility] [employer] telemedicine laws, the patient must reside in the state in which the provider is licensed.   Wyn Raddle, Jackee Shove, NP 05/08/2024, 11:49 AM [facility]

## 2024-05-08 NOTE — Telephone Encounter (Signed)
   Pre-operative Risk Assessment    Patient Name: [name]  DOB: [birth year] MRN: [id number]   Date of last office visit: 06/02/2023, Dr. Aleene Passe, MD Date of next office visit: NONE   Request for Surgical Clearance    Procedure:  Open Incisional Hernia Repair (1 cm)  Date of Surgery:  Clearance TBD                                Surgeon: Dr. Herlene Bureau, MD Surgeon's Group or Practice Name: [facility]  Phone number: [phone] Fax number: [phone]   Type of Clearance Requested:   - Medical  - Pharmacy:  Hold Clopidogrel  (Plavix )     Type of Anesthesia:  General    Additional requests/questions:    [name] [name]   05/08/2024, 8:47 AM

## 2024-05-08 NOTE — Therapy (Signed)
 OUTPATIENT PHYSICAL THERAPY LOWER EXTREMITY TREATMENT   Patient Name: [name] MRN: [id number] DOB:[birth year], 83 y.o., female Today's Date: 05/08/2024  END OF SESSION:  PT End of Session - 05/08/24 1137     Visit Number 3    Number of Visits 12    Date for PT Re-Evaluation 05/16/24    Authorization Type [employer]    Authorization Time Period No auth    PT Start Time 1140    PT Stop Time 1220    PT Time Calculation (min) 40 min    Activity Tolerance Patient tolerated treatment well    Behavior During Therapy WFL for tasks assessed/performed          Past Medical History:  Diagnosis Date   Aortic atherosclerosis (HCC)    Asthma    Breast CA (HCC)    Cancer (HCC)    right breast mastectomy   CVA (cerebral vascular accident) (HCC) 04/27/2015   DDD (degenerative disc disease), cervical    Diabetes mellitus without complication (HCC)    Duodenal ulcer    External hemorrhoids    Generalized anxiety disorder    H/O chest pain 03/24/2006   normal nuclear  study   Headache    High cholesterol    Hypertension    Mild aortic insufficiency 03/28/2006   echo   Normal cardiac stress test 2005   Pancreatitis    Personal history of chemotherapy    Personal history of radiation therapy    PONV (postoperative nausea and vomiting)    Sinus bradycardia on ECG    Stroke [facility])    Past Surgical History:  Procedure Laterality Date   ABDOMINAL HYSTERECTOMY     APPENDECTOMY     30 years ago   childbirth     x 2   CHOLECYSTECTOMY     15 years ago   ESOPHAGOGASTRODUODENOSCOPY N/A 03/04/2014   Procedure: ESOPHAGOGASTRODUODENOSCOPY (EGD);  Surgeon: Belvie JONETTA Just, MD;  Location: [facility] ENDOSCOPY;  Service: Endoscopy;  Laterality: N/A;   MASTECTOMY  1993   right breast   RIGHT/LEFT HEART CATH AND CORONARY ANGIOGRAPHY N/A 05/30/2019   Procedure: RIGHT/LEFT HEART CATH AND CORONARY ANGIOGRAPHY;  Surgeon: Dann Candyce RAMAN, MD;  Location: [facility] INVASIVE CV LAB;   Service: Cardiovascular;  Laterality: N/A;   TUBAL LIGATION     Patient Active Problem List   Diagnosis Date Noted   UTI (urinary tract infection) 03/17/2024   Lower extremity weakness 03/16/2024   Infection due to parainfluenza virus 3 02/17/2024   GAD (generalized anxiety disorder) 02/16/2024   Acute bronchitis 02/15/2024   RSV (respiratory syncytial virus pneumonia) 10/03/2022   Fracture of proximal phalanx of lesser toe of right foot 10/02/2022   Chronic diastolic CHF (congestive heart failure) (HCC) 10/02/2022   Lactic acidosis 10/02/2022   Mixed hyperlipidemia due to type 2 diabetes mellitus (HCC) 10/02/2022   Antibiotic reaction, initial encounter 10/02/2022   Abnormal CT of the chest 10/02/2022   Dehydration with hyponatremia 10/01/2022   Type 2 diabetes mellitus with hyperglycemia (HCC) 10/01/2022   Bronchitis 05/25/2021   Asthma, chronic, unspecified asthma severity, with acute exacerbation 05/25/2021   Angina pectoris (HCC)    Arthritis of carpometacarpal (CMC) joint of left thumb 10/03/2017   De Quervain's tenosynovitis, left 10/03/2017   History of stroke 06/13/2017   DOE (dyspnea on exertion) 05/24/2017   Occipital neuralgia of right side 11/02/2016   Palpitations 06/17/2015   Cerebrovascular accident (CVA) due to occlusion of left middle cerebral artery (HCC) 06/17/2015  Hyperlipidemia 06/17/2015   HLD (hyperlipidemia)    Intracranial vascular stenosis    Cerebral infarction due to occlusion of left middle cerebral artery (HCC)    Occipital headache    Extrinsic asthma 07/02/2014   Migraine variant with status migrainosus 07/02/2014   Benign paroxysmal positional vertigo 07/02/2014   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/2014   Hypertension associated with diabetes (HCC) 03/03/2014   Mild aortic insufficiency    H/O chest pain    Sinus bradycardia on ECG    GERD without esophagitis 03/24/2008   ADENOCARCINOMA, BREAST, RIGHT 06/30/2007   Asthma 06/30/2007    Upper airway cough syndrome 06/30/2007    PCP: Leonel Cole, MD  REFERRING PROVIDER: Leonel Cole, MD  REFERRING DIAG: R53.1 (ICD-10-CM) - Weakness  THERAPY DIAG:  Other abnormalities of gait and mobility  Muscle weakness (generalized)  Repeated falls  Rationale for Evaluation and Treatment: Rehabilitation  ONSET DATE: December 2024  SUBJECTIVE:   SUBJECTIVE STATEMENT: Feels her walking is off due to fear of falling, no reports of pain or recent falls.  Feels her energy has decreased, stated she can  2 laps in her driveway, unable to get in 3 laps.  Eval:[name], doesn't get around good, and has had falls. Reports MD wants her hip to be worked on. Clemens down a flight of steps, in December. Broke her tail bone, crack in her L hip, and broke a rib. Injection hasn't helped much. Her biggest concern is her hip pain, balance, and LE weakness. Reports hip pain is mostly during transfers. Got two injections a week ago.   PERTINENT HISTORY: Cerv DDD CVA 2016  PAIN:  Are you having pain? No  PRECAUTIONS: None  RED FLAGS: None   WEIGHT BEARING RESTRICTIONS: No  FALLS:  Has patient fallen in last 6 months? Yes. Number of falls 4  LIVING ENVIRONMENT: Lives with: lives with their spouse Lives in: House/apartment Stairs: Yes: External: 3 steps; bilateral but cannot reach both Has following equipment at home: Single point cane and Walker - 2 wheeled  OCCUPATION: Retired, Passenger transport manager  PLOF: Independent  PATIENT GOALS:   NEXT MD VISIT: No follow up set up  OBJECTIVE:  Note: Objective measures were completed at Evaluation unless otherwise noted.  DIAGNOSTIC FINDINGS:  IMPRESSION: 1. Lumbar MRI stable since April, no acute or inflammatory findings. 2. Chronic grade 1 spondylolisthesis at L3-L4 and L4-L5 with degeneration asymmetric to the right. Isolated mild spinal stenosis at the latter. Moderate right L5 nerve level lateral recess stenosis.  Mild right L4 and L5 foraminal stenosis. 3. Mild for age lumbar degeneration otherwise.  PATIENT SURVEYS:  ABC scale: The Activities-Specific Balance Confidence (ABC) Scale:   Total ABC score: 1360 / 1600 = 85.0 % 0% 10 20 30  40 50 60 70 80 90 100% No confidence<->completely confident    Lower Extremity Functional Score: 50 / 80 = 62.5 %  COGNITION: Overall cognitive status: Within functional limits for tasks assessed     SENSATION: WFL   POSTURE: rounded shoulders, forward head, and increased thoracic kyphosis  PALPATION: TTP in L ischium area and L sacral area Unremarkable low back palpation  LOWER EXTREMITY ROM:   ROM Right eval Left eval  Hip flexion    Hip extension    Hip abduction    Hip adduction    Hip internal rotation    Hip external rotation    Knee flexion    Knee extension    Ankle dorsiflexion  Ankle plantarflexion    Ankle inversion    Ankle eversion     (Blank rows = not tested)  LOWER EXTREMITY MMT:  MMT Right eval Left eval  Hip flexion 3+ 3+  Hip extension 3+ 3+  Hip abduction 4- (seated) 4- (seated)  Hip adduction 4 (seated) 4 (seated)  Hip internal rotation    Hip external rotation    Knee flexion    Knee extension    Ankle dorsiflexion 4 4  Ankle plantarflexion    Ankle inversion    Ankle eversion     (Blank rows = not tested)    FUNCTIONAL TESTS:  30 seconds chair stand test Timed up and go (TUG): 18  30 sec chair stand: 5.5, one instance of LOB with first STS  Four Stage Balance:  Feet side by side: 30 Semi tandem: 30 w/ each LE, leans to L Full Tandem: R-30  L-24 SLS: 3 each LE  GAIT: Distance walked: 100 Assistive device utilized: None Level of assistance: Complete Independence Comments: Mild shuffling at times, Dec step bilat, Dec stance on LLE, dec trunk rotation and arm swing                                                                                                                                 TREATMENT DATE:  05/08/24: Standing near // bars: - STS to heel raise 10x 3 no HHA - Toe tapping alternationg 6in step height 20x no HHA - Sidestep with GTB around thighs 3RT inside // bars, no HHA - Abduction with GTB around thigh 10x each 2 sets -Tandem stance 1x 30 -Tandem stance on foam 3x 30 intermittent HHA - SLS Lt 10, Rt 5 - Vector stance 3x 5 with HHA - Nustep [place] UE/LE x 5'   04/25/24 Review of HEP and goals DGI 1. Gait level surface (2) Mild Impairment: Walks 20', uses assistive devices, slower speed, mild gait deviations. 2. Change in gait speed (2) Mild Impairment: Is able to change speed but demonstrates mild gait deviations, or not gait deviations but unable to achieve a significant change in velocity, or uses an assistive device. 3. Gait with horizontal head turns (1) Moderate Impairment: Performs head turns with moderate change in gait velocity, slows down, staggers but recovers, can continue to walk. 4. Gait with vertical head turns 1) Moderate Impairment: Performs head turns with moderate change in gait velocity, slows down, staggers but recovers, can continue to walk. 5. Gait and pivot turn (2) Mild Impairment: Pivot turns safely in > 3 seconds and stops with no loss of balance. 6. Step over obstacle (1) Moderate Impairment: Is able to step over box but must stop, then step over. May require verbal cueing. 7. Step around obstacles (2) Mild Impairment: Is able to step around both cones, but must slow down and adjust steps to clear cones. 8. Stairs (1) Moderate Impairment: Two feet to a stair, must use rail.  TOTAL SCORE: 12 / 24 Nustep seat 8 x 5' for general strengthening and endurance Standing Heel raises 2 x 10 Hip vectors 2 x 8 each Sit to stand x 10 no UE assist Updated HEP      04/18/24: PT Eval and HEP    PATIENT EDUCATION:  Education details: PT evaluation, objective findings, POC, Importance of HEP, Precautions, Clinic policies   Person educated: Patient Education method: Explanation and Demonstration Education comprehension: verbalized understanding and returned demonstration  HOME EXERCISE PROGRAM: Access Code: [email or web address] URL: [email or web address] Date: 04/18/2024 Prepared by: Rosaria Powell-Butler  Exercises - Supine Bridge  - 2 x daily - 7 x weekly - 2 sets - 10 reps - Clamshell  - 2 x daily - 7 x weekly - 2 sets - 10 reps - Standing Tandem Balance with Counter Support  - 2 x daily - 7 x weekly - 3 sets - 1 min hold - Single Leg Stance with Support  - 2 x daily - 7 x weekly - 3 sets - 30 hold  ASSESSMENT:  CLINICAL IMPRESSION: Session focus with LE strengthening and balance training.  Increased challenge with dynamic resistance during static balance activities and theraband resistance for hip strengthening.  Pt tolerated well to increased challenges though reports of fatigue with a coupld of seated rest breaks required.  EOS with nustep for activity tolerance, able to keep average SPM above 50.  No reports of pain, was limited by fatigue.  Eval: Patient is a 83 y.o. female who was seen today for physical therapy evaluation and treatment for R53.1 (ICD-10-CM) - Weakness. Patient demonstrates decreased self perceived function via ABC scale and LEFS, decreased LE MMT, impaired balance, altered gait, and increased fall risk via TUG, all of which may be contributing to patient's impaired function. Patient will benefit from continued skilled physical therapy in order to address the above to improve function and quality of life.    OBJECTIVE IMPAIRMENTS: Abnormal gait, decreased activity tolerance, decreased balance, decreased endurance, decreased strength, improper body mechanics, and postural dysfunction.   ACTIVITY LIMITATIONS: carrying, lifting, bending, standing, squatting, stairs, and transfers  PARTICIPATION LIMITATIONS: meal prep, cleaning, laundry, community activity, and yard work  PERSONAL  FACTORS: Age are also affecting patient's functional outcome.   REHAB POTENTIAL: Good  CLINICAL DECISION MAKING: Stable/uncomplicated  EVALUATION COMPLEXITY: Low   GOALS: Goals reviewed with patient? No  SHORT TERM GOALS: Target date: 05/09/24 Patient will be independent with performance of HEP to demonstrate adequate self management of symptoms.  Baseline:  Goal status: in progress  2.   Patient will report at least a 25% improvement with function or pain overall since beginning PT. Baseline:  Goal status: in progress  LONG TERM GOALS: Target date: 05/30/24 Patient will improve LEFS score by 10 points to demonstrate improved perceived function while meeting MCID.  Baseline: Goal status: in progress 2. Patient will improve ABC Scale score by 10% to demonstrate improved perceived function while meeting MCID.   Baseline:  Goal status: in progress 3.  Patient will improve 30 second chair stand test by at least 3 STS in order to demonstrate improved LE strength and endurance needed for community ambulation.  Baseline:  Goal status: in progress   4.  Patient will perform TUG in 12 seconds or less in order to demonstrate decreased risk of fall.  Baseline: Goal status: in progress     PLAN:  PT FREQUENCY: 2x/week  PT DURATION: 6 weeks  PLANNED INTERVENTIONS: 97164- PT Re-evaluation, 97110-Therapeutic exercises,  02469- Therapeutic activity, [id number]- Neuromuscular re-education, [id number]- Self Care, 02859- Manual therapy, [id number]- Gait training, [phone]- Electrical stimulation (manual), [id number]- Traction (mechanical), [phone] (1-2 muscles), 20561 (3+ muscles)- Dry Needling, Patient/Family education, Balance training, Stair training, Taping, Joint mobilization, Spinal mobilization, Cryotherapy, and Moist heat  PLAN FOR NEXT SESSION:   prog LE strengthening, prog balance challenge  Augustin Mclean, LPTA/CLT; CBIS [phone]  12:57 PM, 05/08/24

## 2024-05-08 NOTE — Telephone Encounter (Signed)
 Preop tele appt now scheduled, med rec and consent done.

## 2024-05-16 ENCOUNTER — Ambulatory Visit (HOSPITAL_COMMUNITY): Admitting: Physical Therapy

## 2024-05-20 ENCOUNTER — Ambulatory Visit (HOSPITAL_COMMUNITY)

## 2024-05-20 ENCOUNTER — Ambulatory Visit: Attending: Cardiovascular Disease

## 2024-05-20 DIAGNOSIS — Z01810 Encounter for preprocedural cardiovascular examination: Secondary | ICD-10-CM

## 2024-05-20 NOTE — Progress Notes (Signed)
 Virtual Visit via Telephone Note   Because of [name] co-morbid illnesses, she is at least at moderate risk for complications without adequate follow up.  This format is felt to be most appropriate for this patient at this time.  Due to technical limitations with video connection Web designer), today's appointment will be conducted as an audio only telehealth visit, and [name] verbally agreed to proceed in this manner.   All issues noted in this document were discussed and addressed.  No physical exam could be performed with this format.  Evaluation Performed:  Preoperative cardiovascular risk assessment _____________   Date:  05/20/2024   Patient ID:  [name], DOB [birth year], MRN [id number] Patient Location:  Home Provider location:   Office  Primary Care Provider:  Leonel Cole, MD Primary Cardiologist:  Aleene Passe, MD (Inactive)  Chief Complaint / Patient Profile   83 y.o. y/o female with a h/o aortic insufficiency, chronic diastolic CHF, bradycardia who is pending open incisional hernia repair and presents today for telephonic preoperative cardiovascular risk assessment.  History of Present Illness    [name] is a 83 y.o. female who presents via audio/video conferencing for a telehealth visit today.  Pt was last seen in cardiology clinic on 06/02/2023 by her [name].  At that time [name] was doing well .  The patient is now pending procedure as outlined above. Since her last visit, she continues to be stable from a cardiac standpoint.  Today she denies chest pain, shortness of breath, lower extremity edema, fatigue, palpitations, melena, hematuria, hemoptysis, diaphoresis, weakness, presyncope, syncope, orthopnea, and PND.   Past Medical History    Past Medical History:  Diagnosis Date   Aortic atherosclerosis (HCC)    Asthma    Breast CA (HCC)    Cancer (HCC)    right breast mastectomy   CVA (cerebral vascular accident) (HCC) 04/27/2015   DDD  (degenerative disc disease), cervical    Diabetes mellitus without complication (HCC)    Duodenal ulcer    External hemorrhoids    Generalized anxiety disorder    H/O chest pain 03/24/2006   normal nuclear  study   Headache    High cholesterol    Hypertension    Mild aortic insufficiency 03/28/2006   echo   Normal cardiac stress test 2005   Pancreatitis    Personal history of chemotherapy    Personal history of radiation therapy    PONV (postoperative nausea and vomiting)    Sinus bradycardia on ECG    Stroke [facility])    Past Surgical History:  Procedure Laterality Date   ABDOMINAL HYSTERECTOMY     APPENDECTOMY     30 years ago   childbirth     x 2   CHOLECYSTECTOMY     15 years ago   ESOPHAGOGASTRODUODENOSCOPY N/A 03/04/2014   Procedure: ESOPHAGOGASTRODUODENOSCOPY (EGD);  Surgeon: Belvie JONETTA Just, MD;  Location[facility] ENDOSCOPY;  Service: Endoscopy;  Laterality: N/A;   MASTECTOMY  1993   right breast   RIGHT/LEFT HEART CATH AND CORONARY ANGIOGRAPHY N/A 05/30/2019   Procedure: RIGHT/LEFT HEART CATH AND CORONARY ANGIOGRAPHY;  Surgeon: Dann Candyce RAMAN, MD;  Location: [facility] INVASIVE CV LAB;  Service: Cardiovascular;  Laterality: N/A;   TUBAL LIGATION      Allergies  Allergies  Allergen Reactions   Ciprofloxacin  Nausea And Vomiting   Codeine Nausea And Vomiting   Doxycycline  Nausea And Vomiting   Oxycodone -Acetaminophen  Nausea And Vomiting   Tramadol  Nausea  And Vomiting    Home Medications    Prior to Admission medications   Medication Sig Start Date End Date Taking? Authorizing Provider  albuterol  (VENTOLIN  HFA) 108 (90 Base) MCG/ACT inhaler Inhale 2 puffs into the lungs every 6 (six) hours as needed. Patient taking differently: Inhale 2 puffs into the lungs every 6 (six) hours as needed for wheezing or shortness of breath. 02/08/24   Leath-Warren, Etta PARAS, NP  amLODipine  (NORVASC ) 5 MG tablet Take 5 mg by mouth daily.    [provider]  atorvastatin   (LIPITOR ) 80 MG tablet Take 1 tablet (80 mg total) by mouth daily at 6 PM. 07/06/15   Jerri Pfeiffer, MD  clopidogrel  (PLAVIX ) 75 MG tablet Take 1 tablet (75 mg total) by mouth daily. 06/01/17   Jerri Pfeiffer, MD  cyanocobalamin  1000 MCG tablet Take 1 tablet (1,000 mcg total) by mouth daily. 03/18/24   Raenelle Coria, MD  DULoxetine  (CYMBALTA ) 30 MG capsule Take 30 mg by mouth daily. 05/05/21   [provider]  glipiZIDE  (GLUCOTROL  XL) 2.5 MG 24 hr tablet Take 1 tablet (2.5 mg total) by mouth daily with breakfast. 03/17/24   Raenelle Coria, MD  isosorbide  mononitrate (IMDUR ) 30 MG 24 hr tablet Take 30 mg by mouth daily. 03/07/24   [provider]  metFORMIN  (GLUCOPHAGE ) 500 MG tablet Take 1 tablet (500 mg total) by mouth 2 (two) times daily with a meal. 03/17/24   Raenelle Coria, MD  montelukast  (SINGULAIR ) 10 MG tablet Take 1 tablet (10 mg total) by mouth at bedtime. 05/29/21   Singh, Prashant K, MD  pantoprazole  (PROTONIX ) 40 MG tablet Take 30-60 min before first meal of the day Patient taking differently: Take 40 mg by mouth 2 (two) times daily. Take 30-60 min before first meal of the day 12/23/22   Darlean Ozell NOVAK, MD    Physical Exam    Vital Signs:  CLEVA CAMERO does not have vital signs available for review today.  Given telephonic nature of communication, physical exam is limited. AAOx3. NAD. Normal affect.  Speech and respirations are unlabored.  Accessory Clinical Findings    None  Assessment & Plan    1.  Preoperative Cardiovascular Risk Assessment:Open Incisional Hernia Repair (1 cm)   Date of Surgery:  Clearance TBD                                  Surgeon: Dr. Herlene Bureau, MD Surgeon's Group or Practice Name: [facility]  Phone number: [phone] Fax number: [phone]      Primary Cardiologist: Aleene Passe, MD (Inactive)  Chart reviewed as part of pre-operative protocol coverage. Given past medical history and time since last visit,  based on ACC/AHA guidelines, [name] would be at acceptable risk for the planned procedure without further cardiovascular testing.   Her RCRI is moderate risk, 6.6% risk of major cardiac event.  She is able to complete greater than 4 METS of physical activity.  Patient was advised that if she develops new symptoms prior to surgery to contact our office to arrange a follow-up appointment.  She verbalized understanding.  Patient Plavix  is not managed by cardiology.  Recommendations for holding Plavix  will need to come from prescribing provider.  I will route this recommendation to the requesting party via Epic fax function and remove from pre-op pool.      Time:   Today, I have spent 5  minutes with the patient with telehealth technology discussing medical history, symptoms, and management plan.  I spent 10 minutes reviewing patient's past cardiac history and cardiac medications.    Josefa CHRISTELLA Beauvais, NP  05/20/2024, 8:03 AM

## 2024-05-22 ENCOUNTER — Ambulatory Visit (HOSPITAL_COMMUNITY): Attending: Family Medicine

## 2024-05-22 ENCOUNTER — Encounter (HOSPITAL_COMMUNITY): Payer: Self-pay

## 2024-05-22 DIAGNOSIS — M6281 Muscle weakness (generalized): Secondary | ICD-10-CM | POA: Diagnosis not present

## 2024-05-22 DIAGNOSIS — R2689 Other abnormalities of gait and mobility: Secondary | ICD-10-CM | POA: Insufficient documentation

## 2024-05-22 DIAGNOSIS — R296 Repeated falls: Secondary | ICD-10-CM | POA: Diagnosis not present

## 2024-05-22 NOTE — Therapy (Signed)
 OUTPATIENT PHYSICAL THERAPY LOWER EXTREMITY TREATMENT   Patient Name: [name] MRN: [id number] DOB:June 03, 1941, 83 y.o., female Today's Date: 05/22/2024  END OF SESSION:  PT End of Session - 05/22/24 1605     Visit Number 4    Number of Visits 12    Date for PT Re-Evaluation 05/16/24    Authorization Type [employer]    Authorization Time Period No auth    PT Start Time 1604    PT Stop Time 1642    PT Time Calculation (min) 38 min    Activity Tolerance Patient tolerated treatment well    Behavior During Therapy WFL for tasks assessed/performed           Past Medical History:  Diagnosis Date   Aortic atherosclerosis (HCC)    Asthma    Breast CA (HCC)    Cancer (HCC)    right breast mastectomy   CVA (cerebral vascular accident) (HCC) 04/27/2015   DDD (degenerative disc disease), cervical    Diabetes mellitus without complication (HCC)    Duodenal ulcer    External hemorrhoids    Generalized anxiety disorder    H/O chest pain 03/24/2006   normal nuclear  study   Headache    High cholesterol    Hypertension    Mild aortic insufficiency 03/28/2006   echo   Normal cardiac stress test 2005   Pancreatitis    Personal history of chemotherapy    Personal history of radiation therapy    PONV (postoperative nausea and vomiting)    Sinus bradycardia on ECG    Stroke [facility])    Past Surgical History:  Procedure Laterality Date   ABDOMINAL HYSTERECTOMY     APPENDECTOMY     30 years ago   childbirth     x 2   CHOLECYSTECTOMY     15 years ago   ESOPHAGOGASTRODUODENOSCOPY N/A 03/04/2014   Procedure: ESOPHAGOGASTRODUODENOSCOPY (EGD);  Surgeon: Leslie JONETTA Just, MD;  Location: [facility] ENDOSCOPY;  Service: Endoscopy;  Laterality: N/A;   MASTECTOMY  1993   right breast   RIGHT/LEFT HEART CATH AND CORONARY ANGIOGRAPHY N/A 05/30/2019   Procedure: RIGHT/LEFT HEART CATH AND CORONARY ANGIOGRAPHY;  Surgeon: Leslie Candyce RAMAN, MD;  Location: [facility] INVASIVE CV LAB;   Service: Cardiovascular;  Laterality: N/A;   TUBAL LIGATION     Patient Active Problem List   Diagnosis Date Noted   UTI (urinary tract infection) 03/17/2024   Lower extremity weakness 03/16/2024   Infection due to parainfluenza virus 3 02/17/2024   GAD (generalized anxiety disorder) 02/16/2024   Acute bronchitis 02/15/2024   RSV (respiratory syncytial virus pneumonia) 10/03/2022   Fracture of proximal phalanx of lesser toe of right foot 10/02/2022   Chronic diastolic CHF (congestive heart failure) (HCC) 10/02/2022   Lactic acidosis 10/02/2022   Mixed hyperlipidemia due to type 2 diabetes mellitus (HCC) 10/02/2022   Antibiotic reaction, initial encounter 10/02/2022   Abnormal CT of the chest 10/02/2022   Dehydration with hyponatremia 10/01/2022   Type 2 diabetes mellitus with hyperglycemia (HCC) 10/01/2022   Bronchitis 05/25/2021   Asthma, chronic, unspecified asthma severity, with acute exacerbation 05/25/2021   Angina pectoris (HCC)    Arthritis of carpometacarpal (CMC) joint of left thumb 10/03/2017   De Quervain's tenosynovitis, left 10/03/2017   History of stroke 06/13/2017   DOE (dyspnea on exertion) 05/24/2017   Occipital neuralgia of right side 11/02/2016   Palpitations 06/17/2015   Cerebrovascular accident (CVA) due to occlusion of left middle cerebral artery (HCC)  06/17/2015   Hyperlipidemia 06/17/2015   HLD (hyperlipidemia)    Intracranial vascular stenosis    Cerebral infarction due to occlusion of left middle cerebral artery (HCC)    Occipital headache    Extrinsic asthma 07/02/2014   Migraine variant with status migrainosus 07/02/2014   Benign paroxysmal positional vertigo 07/02/2014   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/2014   Hypertension associated with diabetes (HCC) 03/03/2014   Mild aortic insufficiency    H/O chest pain    Sinus bradycardia on ECG    GERD without esophagitis 03/24/2008   ADENOCARCINOMA, BREAST, RIGHT 06/30/2007   Asthma 06/30/2007    Upper airway cough syndrome 06/30/2007    PCP: Leslie Cole, MD  REFERRING PROVIDER: Leonel Cole, MD  REFERRING DIAG: R53.1 (ICD-10-CM) - Weakness  THERAPY DIAG:  Other abnormalities of gait and mobility  Muscle weakness (generalized)  Repeated falls  Rationale for Evaluation and Treatment: Rehabilitation  ONSET DATE: December 2024  SUBJECTIVE:   SUBJECTIVE STATEMENT: Pt states she has had two falls since last session, both on hardwoods. Pt was able to get herself up one time but needed help the second time. Pt states she has a appointment with neurology because of all these falls. Pt has appointment with doctor about 3:00 to discuss some things. Pt states she does think the therapy is helping and would like to try two times per week since she found out the copay is only $5 per visit.   Eval:[name], doesn't get around good, and has had falls. Reports MD wants her hip to be worked on. Clemens down a flight of steps, in December. Broke her tail bone, crack in her L hip, and broke a rib. Injection hasn't helped much. Her biggest concern is her hip pain, balance, and LE weakness. Reports hip pain is mostly during transfers. Got two injections a week ago.   PERTINENT HISTORY: Cerv DDD CVA 2016  PAIN:  Are you having pain? No  PRECAUTIONS: None  RED FLAGS: None   WEIGHT BEARING RESTRICTIONS: No  FALLS:  Has patient fallen in last 6 months? Yes. Number of falls 4  LIVING ENVIRONMENT: Lives with: lives with their spouse Lives in: House/apartment Stairs: Yes: External: 3 steps; bilateral but cannot reach both Has following equipment at home: Single point cane and Walker - 2 wheeled  OCCUPATION: Retired, Passenger transport manager  PLOF: Independent  PATIENT GOALS:   NEXT MD VISIT: No follow up set up  OBJECTIVE:  Note: Objective measures were completed at Evaluation unless otherwise noted.  DIAGNOSTIC FINDINGS:  IMPRESSION: 1. Lumbar MRI stable since  April, no acute or inflammatory findings. 2. Chronic grade 1 spondylolisthesis at L3-L4 and L4-L5 with degeneration asymmetric to the right. Isolated mild spinal stenosis at the latter. Moderate right L5 nerve level lateral recess stenosis. Mild right L4 and L5 foraminal stenosis. 3. Mild for age lumbar degeneration otherwise.  PATIENT SURVEYS:  ABC scale: The Activities-Specific Balance Confidence (ABC) Scale:   Total ABC score: 1360 / 1600 = 85.0 % 0% 10 20 30  40 50 60 70 80 90 100% No confidence<->completely confident    Lower Extremity Functional Score: 50 / 80 = 62.5 %  COGNITION: Overall cognitive status: Within functional limits for tasks assessed     SENSATION: WFL   POSTURE: rounded shoulders, forward head, and increased thoracic kyphosis  PALPATION: TTP in L ischium area and L sacral area Unremarkable low back palpation  LOWER EXTREMITY ROM:   ROM Right eval Left eval  Hip flexion    Hip extension    Hip abduction    Hip adduction    Hip internal rotation    Hip external rotation    Knee flexion    Knee extension    Ankle dorsiflexion    Ankle plantarflexion    Ankle inversion    Ankle eversion     (Blank rows = not tested)  LOWER EXTREMITY MMT:  MMT Right eval Left eval  Hip flexion 3+ 3+  Hip extension 3+ 3+  Hip abduction 4- (seated) 4- (seated)  Hip adduction 4 (seated) 4 (seated)  Hip internal rotation    Hip external rotation    Knee flexion    Knee extension    Ankle dorsiflexion 4 4  Ankle plantarflexion    Ankle inversion    Ankle eversion     (Blank rows = not tested)    FUNCTIONAL TESTS:  30 seconds chair stand test Timed up and go (TUG): 18  30 sec chair stand: 5.5, one instance of LOB with first STS  Four Stage Balance:  Feet side by side: 30 Semi tandem: 30 w/ each LE, leans to L Full Tandem: R-30  L-24 SLS: 3 each LE  GAIT: Distance walked: 100 Assistive device utilized: None Level of assistance:  Complete Independence Comments: Mild shuffling at times, Dec step bilat, Dec stance on LLE, dec trunk rotation and arm swing                                                                                                                                TREATMENT DATE:  05/22/2024  Therapeutic Exercise: -Nustep, level 3 resistance, 5 minutes, pt cued for 60 SPM -Hamstring curls, 2 sets of 10 reps, plate 2> plate 3, pt cued for eccentric control -Leg Press, 2 sets of 10 reps, plate 3> plate 4, pt cued for eccentric control and avoidance of max knee extension -Supine bridges 2 sets of 10 reps, 3 second holds, symptomatic, pt cued for max hip extension -Standing 3 way hip 1 sets 10 reps, bilaterally, pt cued for upright trunk and maintaining of neutral spine -Lateral stepping 3 laps 20 feet per lap, second 2 with RTB around ankles, pt cued for upright posture Neuromuscular Re-education: -Aeromat walks, 3 lengths in parallel bars and gait belt with safety, 2 laps tandem walking, 2 laps lateral stepping -Forward lunges on to bosu ball, 1 set of 5 reps, pt cued for core activation and upright posture -SLS trial, 1 reps each side, 30 seconds per side, pt cued for decreased     05/08/24: Standing near // bars: - STS to heel raise 10x 3 no HHA - Toe tapping alternationg 6in step height 20x no HHA - Sidestep with GTB around thighs 3RT inside // bars, no HHA - Abduction with GTB around thigh 10x each 2 sets -Tandem stance 1x 30 -Tandem stance on foam 3x 30 intermittent HHA - SLS Lt  10, Rt 5 - Vector stance 3x 5 with HHA - Nustep [place] UE/LE x 5'   04/25/24 Review of HEP and goals DGI 1. Gait level surface (2) Mild Impairment: Walks 20', uses assistive devices, slower speed, mild gait deviations. 2. Change in gait speed (2) Mild Impairment: Is able to change speed but demonstrates mild gait deviations, or not gait deviations but unable to achieve a significant change in velocity, or  uses an assistive device. 3. Gait with horizontal head turns (1) Moderate Impairment: Performs head turns with moderate change in gait velocity, slows down, staggers but recovers, can continue to walk. 4. Gait with vertical head turns 1) Moderate Impairment: Performs head turns with moderate change in gait velocity, slows down, staggers but recovers, can continue to walk. 5. Gait and pivot turn (2) Mild Impairment: Pivot turns safely in > 3 seconds and stops with no loss of balance. 6. Step over obstacle (1) Moderate Impairment: Is able to step over box but must stop, then step over. May require verbal cueing. 7. Step around obstacles (2) Mild Impairment: Is able to step around both cones, but must slow down and adjust steps to clear cones. 8. Stairs (1) Moderate Impairment: Two feet to a stair, must use rail.  TOTAL SCORE: 12 / 24 Nustep seat 8 x 5' for general strengthening and endurance Standing Heel raises 2 x 10 Hip vectors 2 x 8 each Sit to stand x 10 no UE assist Updated HEP      04/18/24: PT Eval and HEP    PATIENT EDUCATION:  Education details: PT evaluation, objective findings, POC, Importance of HEP, Precautions, Clinic policies  Person educated: Patient Education method: Explanation and Demonstration Education comprehension: verbalized understanding and returned demonstration  HOME EXERCISE PROGRAM: Access Code: [email or web address] URL: [email or web address] Date: 04/18/2024 Prepared by: Rosaria Powell-Butler  Exercises - Supine Bridge  - 2 x daily - 7 x weekly - 2 sets - 10 reps - Clamshell  - 2 x daily - 7 x weekly - 2 sets - 10 reps - Standing Tandem Balance with Counter Support  - 2 x daily - 7 x weekly - 3 sets - 1 min hold - Single Leg Stance with Support  - 2 x daily - 7 x weekly - 3 sets - 30 hold  ASSESSMENT:  CLINICAL IMPRESSION: Patient continues to demonstrate decreased LE strength, decreased gait quality and balance. Patient also demonstrates  decreased endurance with aerobic based exercise during today's session, multiple rest breaks required. Patient able to progress dynamic balance and core activation exercises today with aeromat walks and LE strength machines, good performance with verbal cueing. Patient would continue to benefit from skilled physical therapy for increased endurance with ambulation, increased LE strength, and improved balance for improved quality of life, decreased fall risk and continued progress towards therapy goals. Pt has not met majority of goals as this was only her Pt now willing to comply with 2x per week, communicated with front desk increase in frequency, extending cert to 89/92/74.    Eval: Patient is a 83 y.o. female who was seen today for physical therapy evaluation and treatment for R53.1 (ICD-10-CM) - Weakness. Patient demonstrates decreased self perceived function via ABC scale and LEFS, decreased LE MMT, impaired balance, altered gait, and increased fall risk via TUG, all of which may be contributing to patient's impaired function. Patient will benefit from continued skilled physical therapy in order to address the above to improve function and quality of life.  OBJECTIVE IMPAIRMENTS: Abnormal gait, decreased activity tolerance, decreased balance, decreased endurance, decreased strength, improper body mechanics, and postural dysfunction.   ACTIVITY LIMITATIONS: carrying, lifting, bending, standing, squatting, stairs, and transfers  PARTICIPATION LIMITATIONS: meal prep, cleaning, laundry, community activity, and yard work  PERSONAL FACTORS: Age are also affecting patient's functional outcome.   REHAB POTENTIAL: Good  CLINICAL DECISION MAKING: Stable/uncomplicated  EVALUATION COMPLEXITY: Low   GOALS: Goals reviewed with patient? No  SHORT TERM GOALS: Target date: 05/09/24 Patient will be independent with performance of HEP to demonstrate adequate self management of symptoms.  Baseline:  Goal  status: MET  2.   Patient will report at least a 25% improvement with function or pain overall since beginning PT. Baseline:  Goal status: in progress  LONG TERM GOALS: Target date: 05/30/24 Patient will improve LEFS score by 10 points to demonstrate improved perceived function while meeting MCID.  Baseline: Goal status: in progress 2. Patient will improve ABC Scale score by 10% to demonstrate improved perceived function while meeting MCID.   Baseline:  Goal status: in progress 3.  Patient will improve 30 second chair stand test by at least 3 STS in order to demonstrate improved LE strength and endurance needed for community ambulation.  Baseline:  Goal status: in progress   4.  Patient will perform TUG in 12 seconds or less in order to demonstrate decreased risk of fall.  Baseline: Goal status: in progress     PLAN:  PT FREQUENCY: 2x/week  PT DURATION: 6 weeks  PLANNED INTERVENTIONS: 97164- PT Re-evaluation, 97110-Therapeutic exercises, 97530- Therapeutic activity, [id number]- Neuromuscular re-education, 97535- Self Care, [id number]- Manual therapy, [id number]- Gait training, [phone]- Electrical stimulation (manual), [id number]- Traction (mechanical), [phone] (1-2 muscles), 20561 (3+ muscles)- Dry Needling, Patient/Family education, Balance training, Stair training, Taping, Joint mobilization, Spinal mobilization, Cryotherapy, and Moist heat  PLAN FOR NEXT SESSION:   prog LE strengthening, prog balance challenge  Lang Ada, PT, DPT [facility] Office: [phone] 4:08 PM, 05/22/24

## 2024-05-24 DIAGNOSIS — R296 Repeated falls: Secondary | ICD-10-CM | POA: Diagnosis not present

## 2024-05-24 DIAGNOSIS — S76319A Strain of muscle, fascia and tendon of the posterior muscle group at thigh level, unspecified thigh, initial encounter: Secondary | ICD-10-CM | POA: Diagnosis not present

## 2024-05-24 DIAGNOSIS — F411 Generalized anxiety disorder: Secondary | ICD-10-CM | POA: Diagnosis not present

## 2024-05-24 DIAGNOSIS — I7774 Dissection of vertebral artery: Secondary | ICD-10-CM | POA: Diagnosis not present

## 2024-05-24 DIAGNOSIS — W19XXXA Unspecified fall, initial encounter: Secondary | ICD-10-CM | POA: Diagnosis not present

## 2024-05-24 DIAGNOSIS — R531 Weakness: Secondary | ICD-10-CM | POA: Diagnosis not present

## 2024-05-24 DIAGNOSIS — I725 Aneurysm of other precerebral arteries: Secondary | ICD-10-CM | POA: Diagnosis not present

## 2024-05-27 ENCOUNTER — Other Ambulatory Visit: Payer: Self-pay | Admitting: Family Medicine

## 2024-05-27 DIAGNOSIS — W19XXXA Unspecified fall, initial encounter: Secondary | ICD-10-CM

## 2024-05-29 ENCOUNTER — Encounter (HOSPITAL_COMMUNITY)

## 2024-05-31 ENCOUNTER — Emergency Department (HOSPITAL_BASED_OUTPATIENT_CLINIC_OR_DEPARTMENT_OTHER)

## 2024-05-31 ENCOUNTER — Encounter (HOSPITAL_BASED_OUTPATIENT_CLINIC_OR_DEPARTMENT_OTHER): Payer: Self-pay

## 2024-05-31 ENCOUNTER — Other Ambulatory Visit: Payer: Self-pay

## 2024-05-31 ENCOUNTER — Encounter (HOSPITAL_COMMUNITY)

## 2024-05-31 ENCOUNTER — Emergency Department (HOSPITAL_BASED_OUTPATIENT_CLINIC_OR_DEPARTMENT_OTHER)
Admission: EM | Admit: 2024-05-31 | Discharge: 2024-06-01 | Disposition: A | Discharge status: Home / Self Care | Attending: Emergency Medicine | Admitting: Emergency Medicine

## 2024-05-31 DIAGNOSIS — R519 Headache, unspecified: Secondary | ICD-10-CM | POA: Diagnosis not present

## 2024-05-31 DIAGNOSIS — R220 Localized swelling, mass and lump, head: Secondary | ICD-10-CM | POA: Insufficient documentation

## 2024-05-31 DIAGNOSIS — S0990XA Unspecified injury of head, initial encounter: Secondary | ICD-10-CM

## 2024-05-31 DIAGNOSIS — E119 Type 2 diabetes mellitus without complications: Secondary | ICD-10-CM | POA: Insufficient documentation

## 2024-05-31 DIAGNOSIS — M545 Low back pain, unspecified: Secondary | ICD-10-CM | POA: Insufficient documentation

## 2024-05-31 DIAGNOSIS — Z8673 Personal history of transient ischemic attack (TIA), and cerebral infarction without residual deficits: Secondary | ICD-10-CM | POA: Diagnosis not present

## 2024-05-31 DIAGNOSIS — I10 Essential (primary) hypertension: Secondary | ICD-10-CM | POA: Diagnosis not present

## 2024-05-31 DIAGNOSIS — Z7984 Long term (current) use of oral hypoglycemic drugs: Secondary | ICD-10-CM | POA: Diagnosis not present

## 2024-05-31 DIAGNOSIS — Z79899 Other long term (current) drug therapy: Secondary | ICD-10-CM | POA: Diagnosis not present

## 2024-05-31 DIAGNOSIS — W01198A Fall on same level from slipping, tripping and stumbling with subsequent striking against other object, initial encounter: Secondary | ICD-10-CM | POA: Diagnosis not present

## 2024-05-31 DIAGNOSIS — W19XXXA Unspecified fall, initial encounter: Secondary | ICD-10-CM

## 2024-05-31 DIAGNOSIS — Z7902 Long term (current) use of antithrombotics/antiplatelets: Secondary | ICD-10-CM | POA: Diagnosis not present

## 2024-05-31 DIAGNOSIS — N281 Cyst of kidney, acquired: Secondary | ICD-10-CM | POA: Diagnosis not present

## 2024-05-31 DIAGNOSIS — S5012XA Contusion of left forearm, initial encounter: Secondary | ICD-10-CM | POA: Insufficient documentation

## 2024-05-31 DIAGNOSIS — S5011XA Contusion of right forearm, initial encounter: Secondary | ICD-10-CM | POA: Diagnosis not present

## 2024-05-31 MED ORDER — ACETAMINOPHEN 500 MG PO TABS
1000.0000 mg | ORAL_TABLET | Freq: Once | ORAL | Status: DC
Start: 2024-05-31 — End: 2024-05-31
  Filled 2024-05-31: qty 2

## 2024-05-31 NOTE — ED Notes (Signed)
 Pt took 1000mg  of Tylenol  at 1800.

## 2024-05-31 NOTE — ED Provider Notes (Signed)
 [place] EMERGENCY DEPARTMENT AT [facility] Provider Note   CSN: [id number] Arrival date & time: 05/31/24  1940     Patient presents with: No chief complaint on file.   [name] is a 83 y.o. female.   HPI   83 year old female presents emergency department after a fall.  States that she was trying to get into the bed when she tried to pull a blanket to help stabilize herself.  The blanket was not attached to anything and she had up falling.  Initially hit her face on the bed subsequently fell backwards hitting the back of her head on the carpeted floor.  Patient is on Plavix .  Denies any LOC.  Currently complaining of low back pain, neck and head pain.  Denies any visual disturbance, gait abnormality, slurred speech, facial droop, weakness or sensory fisted with extremities.  Denies any chest pain patient was only on the ground for a matter of seconds before her husband helped her up.  Presents for reevaluation.  Incident occurred around 5-6 this evening.  Past medical history significant for aortic insufficiency, malignancy, hypertension, hypercholesterolemia, duodenal ulcer, pancreatitis, CVA, diabetes mellitus, GAD  Prior to Admission medications   Medication Sig Start Date End Date Taking? Authorizing Provider  albuterol  (VENTOLIN  HFA) 108 (90 Base) MCG/ACT inhaler Inhale 2 puffs into the lungs every 6 (six) hours as needed. Patient taking differently: Inhale 2 puffs into the lungs every 6 (six) hours as needed for wheezing or shortness of breath. 02/08/24   Leath-Warren, Etta PARAS, NP  amLODipine  (NORVASC ) 5 MG tablet Take 5 mg by mouth daily.    [provider]  atorvastatin  (LIPITOR ) 80 MG tablet Take 1 tablet (80 mg total) by mouth daily at 6 PM. 07/06/15   Jerri Pfeiffer, MD  clopidogrel  (PLAVIX ) 75 MG tablet Take 1 tablet (75 mg total) by mouth daily. 06/01/17   Jerri Pfeiffer, MD  cyanocobalamin  1000 MCG tablet Take 1 tablet (1,000 mcg total) by mouth daily.  03/18/24   Raenelle Coria, MD  DULoxetine  (CYMBALTA ) 30 MG capsule Take 30 mg by mouth daily. 05/05/21   [provider]  glipiZIDE  (GLUCOTROL  XL) 2.5 MG 24 hr tablet Take 1 tablet (2.5 mg total) by mouth daily with breakfast. 03/17/24   Raenelle Coria, MD  isosorbide  mononitrate (IMDUR ) 30 MG 24 hr tablet Take 30 mg by mouth daily. 03/07/24   [provider]  metFORMIN  (GLUCOPHAGE ) 500 MG tablet Take 1 tablet (500 mg total) by mouth 2 (two) times daily with a meal. 03/17/24   Raenelle Coria, MD  montelukast  (SINGULAIR ) 10 MG tablet Take 1 tablet (10 mg total) by mouth at bedtime. 05/29/21   Singh, Prashant K, MD  pantoprazole  (PROTONIX ) 40 MG tablet Take 30-60 min before first meal of the day Patient taking differently: Take 40 mg by mouth 2 (two) times daily. Take 30-60 min before first meal of the day 12/23/22   Darlean Ozell NOVAK, MD    Allergies: Ciprofloxacin , Codeine, Doxycycline , Oxycodone -acetaminophen , and Tramadol     Review of Systems  All other systems reviewed and are negative.   Updated Vital Signs BP 136/81 (BP Location: Right Arm)   Pulse 88   Temp 97.6 F (36.4 C)   Resp 17   SpO2 96%   Physical Exam Vitals and nursing note reviewed.  Constitutional:      General: She is not in acute distress.    Appearance: She is well-developed.  HENT:     Head: Normocephalic.  Comments: Swelling left occipital region. Eyes:     Conjunctiva/sclera: Conjunctivae normal.  Cardiovascular:     Rate and Rhythm: Normal rate and regular rhythm.  Pulmonary:     Effort: Pulmonary effort is normal. No respiratory distress.     Breath sounds: Normal breath sounds. No wheezing, rhonchi or rales.  Abdominal:     Palpations: Abdomen is soft.     Tenderness: There is no abdominal tenderness.  Musculoskeletal:        General: No swelling.     Cervical back: Neck supple.     Comments: No midline tenderness cervical, thoracic spine with mild midline tenderness lumbar spine.   Paraspinal tenderness noted bilaterally and lumbar region.  No bony tenderness bilateral upper extremities.  Areas of ecchymosis anterior forearms bilaterally.  No chest wall tenderness.  Skin:    General: Skin is warm and dry.     Capillary Refill: Capillary refill takes less than 2 seconds.  Neurological:     Mental Status: She is alert.     Comments: Alert and oriented to self, place, time and event.   Speech is fluent, clear without dysarthria or dysphasia.   Strength symmetric in upper/lower extremities   Sensation intact in upper/lower extremities   Able to ambulate independently. CN I not tested  CN II not tested CN III, IV, VI PERRLA and EOMs intact bilaterally  CN V Intact sensation to sharp and light touch to the face  CN VII facial movements symmetric  CN VIII not tested  CN IX, X no uvula deviation, symmetric rise of soft palate  CN XI symmetric SCM and trapezius strength bilaterally  CN XII Midline tongue protrusion, symmetric L/R movements     Psychiatric:        Mood and Affect: Mood normal.     (all labs ordered are listed, but only abnormal results are displayed) Labs Reviewed - No data to display  EKG: None  Radiology: No results found.   Procedures   Medications Ordered in the ED - No data to display                                  Medical Decision Making Amount and/or Complexity of Data Reviewed Radiology: ordered.   This patient presents to the ED for concern of fall, this involves an extensive number of treatment options, and is a complaint that carries with it a high risk of complications and morbidity.  The differential diagnosis includes CVA, fracture, strain/sprain, dislocation, ligamentous/tendinous injury, neurovascular compromise, other   Co morbidities that complicate the patient evaluation  See HPI   Additional history obtained:  Additional history obtained from EMR External records from outside source obtained and  reviewed including hospital records   Lab Tests:  N/a   Imaging Studies ordered:  I ordered imaging studies including CT head/cervical spine, CT lumbar spine I independently visualized and interpreted imaging which showed  CT head/cervical spine: No acute abnormality CT lumbar spine: No acute abnormality I agree with the radiologist interpretation   Cardiac Monitoring: / EKG:  The patient was maintained on a cardiac monitor.  I personally viewed and interpreted the cardiac monitored which showed an underlying rhythm of: Sinus rhythm   Consultations Obtained:  N/a   Problem List / ED Course / Critical interventions / Medication management  Fall Reevaluation of the patient showed that the patient stayed the same I have reviewed the patients  home medicines and have made adjustments as needed   Social Determinants of Health:  Denies tobacco, licit drug use.   Test / Admission - Considered:  Fall Vitals signs within normal range and stable throughout visit. Imaging studies significant for: See above  83 year old female presents emergency department after a fall.  States that she was trying to get into the bed when she tried to pull a blanket to help stabilize herself.  The blanket was not attached to anything and she had up falling.  Initially hit her face on the bed subsequently fell backwards hitting the back of her head on the carpeted floor.  Patient is on Plavix .  Denies any LOC.  Currently complaining of low back pain, neck and head pain.  Denies any visual disturbance, gait abnormality, slurred speech, facial droop, weakness or sensory fisted with extremities.  Denies any chest pain patient was only on the ground for a matter of seconds before her husband helped her up.  Presents for reevaluation.  Incident occurred around 5-6 this evening. On exam, nonfocal neurologic exam from baseline.  midline tenderness lumbar spine with paraspinal tenderness noted bilaterally.  No  other areas of reproducible tenderness/appreciable prior to injury.  CT imaging reassuring of head, cervical spine, lumbar spine.  Patient reassured by workup.  Will recommend symptomatic therapy as in AVS and follow-up with primary care in the outpatient setting.  Treatment plan discussed with patient she does understand and is agreeable.  Patient well-appearing, afebrile in no acute distress upon discharge. Worrisome signs and symptoms were discussed with the patient, and the patient acknowledged understanding to return to the ED if noticed. Patient was stable upon discharge.       Final diagnoses:  None    ED Discharge Orders     None          Silver Wonda LABOR, [place] 05/31/24 2332    Lenor Hollering, MD 06/01/24 1501

## 2024-05-31 NOTE — Discharge Instructions (Signed)
 As discussed, your CTs did not show obvious brain bleed, fracture or dislocation or other abnormality from the fall.  Recommend Tylenol  at home to treat pain as well as icing area of swelling.  Recommend follow with your primary care for reassessment.

## 2024-05-31 NOTE — ED Triage Notes (Signed)
 Pt c/o mechanical fall when getting into bed, advises big knot on my head, it hurts, also c/o low back pain. On plavix  for hx stroke. Denies visual disturbance, NV.

## 2024-06-04 ENCOUNTER — Ambulatory Visit: Payer: Self-pay | Admitting: General Surgery

## 2024-06-05 ENCOUNTER — Encounter (HOSPITAL_COMMUNITY)

## 2024-06-05 DIAGNOSIS — M545 Low back pain, unspecified: Secondary | ICD-10-CM | POA: Diagnosis not present

## 2024-06-05 DIAGNOSIS — M1711 Unilateral primary osteoarthritis, right knee: Secondary | ICD-10-CM | POA: Diagnosis not present

## 2024-06-06 ENCOUNTER — Encounter (HOSPITAL_COMMUNITY)

## 2024-06-10 ENCOUNTER — Ambulatory Visit (HOSPITAL_COMMUNITY): Admission: RE | Admit: 2024-06-10 | Source: Home / Self Care | Admitting: General Surgery

## 2024-06-10 ENCOUNTER — Encounter (HOSPITAL_COMMUNITY): Admission: RE | Payer: Self-pay | Source: Home / Self Care

## 2024-06-10 SURGERY — REPAIR, HERNIA, INCISIONAL
Anesthesia: General

## 2024-06-11 ENCOUNTER — Encounter (HOSPITAL_COMMUNITY)

## 2024-06-12 ENCOUNTER — Telehealth: Payer: Self-pay | Admitting: Neurology

## 2024-06-12 ENCOUNTER — Encounter: Payer: Self-pay | Admitting: Neurology

## 2024-06-12 ENCOUNTER — Ambulatory Visit: Admitting: Neurology

## 2024-06-12 VITALS — BP 168/97 | HR 70 | Ht 65.0 in | Wt 149.0 lb

## 2024-06-12 DIAGNOSIS — R296 Repeated falls: Secondary | ICD-10-CM

## 2024-06-12 DIAGNOSIS — Z8673 Personal history of transient ischemic attack (TIA), and cerebral infarction without residual deficits: Secondary | ICD-10-CM | POA: Diagnosis not present

## 2024-06-12 DIAGNOSIS — G629 Polyneuropathy, unspecified: Secondary | ICD-10-CM

## 2024-06-12 MED ORDER — ASPIRIN 81 MG PO TBEC: 81.0000 mg | DELAYED_RELEASE_TABLET | Freq: Every day | ORAL | 12 refills | Status: AC

## 2024-06-12 NOTE — Patient Instructions (Signed)
 I had a long discussion with the patient and her husband regarding her frequent falls which are likely related to underlying diabetic peripheral neuropathy.  She also has remote history of stroke and intracranial stenosis which will need follow-up.  Recommend recheck with hemoglobin A1c, lipid profile and check EMG nerve conduction study.  Check MRI scan of the brain with MRA of the brain and neck.  Change Plavix  to aspirin  for stroke prevention given increased bruising and maintain aggressive risk factor modification with strict control of hypertension and blood pressure goal below 130/90, diabetes with hemoglobin A1c goal below 6.5% and lipids with LDL cholesterol goal below 70 mg percent.  Encouraged her to use a cane while ambulating outdoors and at all times and avoid getting up quickly and making sudden movements while turning.  Return for follow-up in the future with nurse practitioner in 6 to 8 months or call earlier if necessary.

## 2024-06-12 NOTE — Telephone Encounter (Signed)
 no auth required sent to GI [phone]

## 2024-06-12 NOTE — Progress Notes (Addendum)
 [facility] [address] [place]. [place] [address] [phone]       OFFICE CONSULT NOTE  Ms. MIAMI LATULIPPE Date of Birth:  Jan 05, 1941 Medical Record Number:  [id number]   Referring MD:  Renato Applebaum  Reason for Referral:  falls  HPI: Ms. Lottman is a 83 year old pleasant Caucasian lady seen today for initial office consultation visit for frequent falls.  History is obtained from the patient and review of electronic medical records.  I personally reviewed pertinent available imaging films in PACS.  She has past medical history of diabetes, hypertension, hyperlipidemia, 2 prior strokes, depression, gastroesophageal reflux disease who states that she is has had difficulty walking with frequent falls since December of last year.  She has had at least 3 or 4 falls.  On 1 occasion she fell down 13 flight of steps that she was holding the banister railing but her hand was just too weak to hold.  She went down 13 flights and fractured her tailbone.  She also feels she had a tear in her hamstrings.  She also complains of burning tingling numbness in the feet for the last 6 months from the ankle down.  She does have diabetes for the last 2 years but states it is well-controlled.  She feels the falls occur suddenly without warning and she has no time to hold.  She does not use a cane or walker.  She did try some physical therapy to improve her gait and balance but feels it did not help so she stopped it.  She does have a history of stroke in 2016 she states it affected her speech but she made full recovery.  She saw primary care physician for a frequent falls who felt it must have been related to starting Jardiance  for her diabetes and the medication was stopped but her falls and balance difficulties continue.  She has not yet had EMG nerve conduction study to evaluate for neuropathy and has not tried medications like Topamax  or gabapentin for paresthesias.  MRI scan cervical and thoracic spine  did not show significant compressive lesions to explain her leg weakness and frequent falls.  She did undergo lab work in the hospital and July 2025 which included complete metabolic panel, CBC, vitamin B12, TSH, creatinine kinase, ESR, C-reactive protein, vitamin B1 and B6 were all normal.  Serum protein electrophoresis was also normal.  Patient denies any significant neck pain radicular pain back pain or sciatica.  She denies any bowel bladder control issues.  She is currently on Plavix  which is tolerating well without bruising or bleeding.  She states her blood pressure is well-controlled on Norvasc  and she is tolerating Lipitor  80 mg well without muscle aches and pains.  She states that her sugars under good control though her last hemoglobin A1c on 02/15/2024 was elevated at 8.3.  ROS:   14 system review of systems is positive for frequent falls, tingling, numbness, gait imbalance and all other systems negative  PMH:  Past Medical History:  Diagnosis Date   Aortic atherosclerosis    Asthma    Breast CA (HCC)    Cancer (HCC)    right breast mastectomy   CVA (cerebral vascular accident) (HCC) 04/27/2015   DDD (degenerative disc disease), cervical    Diabetes mellitus without complication (HCC)    Duodenal ulcer    External hemorrhoids    Generalized anxiety disorder    H/O chest pain 03/24/2006   normal nuclear  study   Headache  High cholesterol    Hypertension    Mild aortic insufficiency 03/28/2006   echo   Normal cardiac stress test 2005   Pancreatitis    Personal history of chemotherapy    Personal history of radiation therapy    PONV (postoperative nausea and vomiting)    Sinus bradycardia on ECG    Stroke [facility])     Social History:  Social History   Socioeconomic History   Marital status: Married    Spouse name: Tommy    Number of children: 2   Years of education: 12+   Highest education level: Not on file  Occupational History   Occupation: retired  Tobacco Use    Smoking status: Never   Smokeless tobacco: Never  Vaping Use   Vaping status: Never Used  Substance and Sexual Activity   Alcohol use: No    Alcohol/week: 0.0 standard drinks of alcohol   Drug use: No   Sexual activity: Not Currently  Other Topics Concern   Not on file  Social History Narrative   Right handed, caffeine  2 cups daily, married, 4 kids, Retired.  Hs Grad.    Social Drivers of Corporate investment banker Strain: Not on file  Food Insecurity: No Food Insecurity (03/17/2024)   Hunger Vital Sign    Worried About Running Out of Food in the Last Year: Never true    Ran Out of Food in the Last Year: Never true  Transportation Needs: No Transportation Needs (03/17/2024)   PRAPARE - Administrator, Civil Service (Medical): No    Lack of Transportation (Non-Medical): No  Physical Activity: Not on file  Stress: Not on file  Social Connections: Moderately Integrated (03/17/2024)   Social Connection and Isolation Panel    Frequency of Communication with Friends and Family: More than three times a week    Frequency of Social Gatherings with Friends and Family: More than three times a week    Attends Religious Services: Never    Database administrator or Organizations: No    Attends Engineer, structural: 1 to 4 times per year    Marital Status: Married  Catering manager Violence: Not At Risk (03/17/2024)   Humiliation, Afraid, Rape, and Kick questionnaire    Fear of Current or Ex-Partner: No    Emotionally Abused: No    Physically Abused: No    Sexually Abused: No    Medications:   Current Outpatient Medications on File Prior to Visit  Medication Sig Dispense Refill   albuterol  (VENTOLIN  HFA) 108 (90 Base) MCG/ACT inhaler Inhale 2 puffs into the lungs every 6 (six) hours as needed. (Patient taking differently: Inhale 2 puffs into the lungs every 6 (six) hours as needed for wheezing or shortness of breath.) 8 g 0   amLODipine  (NORVASC ) 5 MG tablet Take 5 mg  by mouth daily.     atorvastatin  (LIPITOR ) 80 MG tablet Take 1 tablet (80 mg total) by mouth daily at 6 PM. 30 tablet 3   clonazePAM  (KLONOPIN ) 1 MG tablet Take 0.5-1 mg by mouth 2 (two) times daily as needed.     clopidogrel  (PLAVIX ) 75 MG tablet Take 1 tablet (75 mg total) by mouth daily. 90 tablet 2   cyanocobalamin  1000 MCG tablet Take 1 tablet (1,000 mcg total) by mouth daily. 30 tablet 2   DULoxetine  (CYMBALTA ) 30 MG capsule Take 30 mg by mouth daily.     glipiZIDE  (GLUCOTROL  XL) 2.5 MG 24 hr tablet Take  1 tablet (2.5 mg total) by mouth daily with breakfast. 30 tablet 2   isosorbide  mononitrate (IMDUR ) 30 MG 24 hr tablet Take 30 mg by mouth daily.     metFORMIN  (GLUCOPHAGE ) 500 MG tablet Take 1 tablet (500 mg total) by mouth 2 (two) times daily with a meal. 60 tablet 2   montelukast  (SINGULAIR ) 10 MG tablet Take 1 tablet (10 mg total) by mouth at bedtime. 30 tablet 0   ondansetron  (ZOFRAN ) 4 MG tablet Take 4 mg by mouth every 8 (eight) hours as needed.     OZEMPIC, 0.25 OR 0.5 MG/DOSE, 2 MG/3ML SOPN SMARTSIG:0.25 Milligram(s) SUB-Q Once a Week     pantoprazole  (PROTONIX ) 40 MG tablet Take 30-60 min before first meal of the day (Patient taking differently: Take 40 mg by mouth 2 (two) times daily. Take 30-60 min before first meal of the day)     No current facility-administered medications on file prior to visit.    Allergies:   Allergies  Allergen Reactions   Ciprofloxacin  Nausea And Vomiting   Codeine Nausea And Vomiting   Doxycycline  Nausea And Vomiting   Oxycodone -Acetaminophen  Nausea And Vomiting   Tramadol  Nausea And Vomiting    Physical Exam General: well developed, well nourished pleasant elderly Caucasian lady, seated, in no evident distress Head: head normocephalic and atraumatic.   Neck: supple with no carotid or supraclavicular bruits Cardiovascular: regular rate and rhythm, no murmurs Musculoskeletal: no deformity Skin:  no rash/petichiae Vascular:  Normal pulses  all extremities  Neurologic Exam Mental Status: Awake and fully alert. Oriented to place and time. Recent and remote memory intact. Attention span, concentration and fund of knowledge appropriate. Mood and affect appropriate.  Cranial Nerves: Fundoscopic exam reveals sharp disc margins. Pupils equal, briskly reactive to light. Extraocular movements full without nystagmus. Visual fields full to confrontation. Hearing intact. Facial sensation intact. Face, tongue, palate moves normally and symmetrically.  Motor: Normal bulk and tone. Normal strength in all tested extremity muscles except trace weakness of bilateral hip flexion and ankle dorsiflexion.. Sensory.:  Diminished touch , pinprick , position and vibratory sensation in both feet in a stocking distribution and below.  Positive Romberg sign. Coordination: Rapid alternating movements normal in all extremities. Finger-to-nose and heel-to-shin performed accurately bilaterally. Gait and Station: Arises from chair without difficulty. Stance is normal. Gait is slightly wide-based.  Unsteady when standing on either foot unsupported.  Unable to walk tandem without difficulty.  .  Reflexes: 1+ and symmetric. Toes downgoing.   NIHSS  0 Modified Rankin  1   ASSESSMENT: 83 year old Caucasian lady with subacute gait and balance difficulties and frequent fall likely multifactorial due to combination of diabetic peripheral neuropathy as well as prior strokes.  Vascular risk factors of diabetes, hypertension, hyperlipidemia and prior strokes     PLAN:I had a long discussion with the patient and her husband regarding her frequent falls which are likely related to underlying diabetic peripheral neuropathy.  She also has remote history of stroke and intracranial stenosis which will need follow-up.  Recommend recheck with hemoglobin A1c, lipid profile and check EMG nerve conduction study.  Check MRI scan of the brain with MRA of the brain and neck.  Change  Plavix  to aspirin  for stroke prevention given increased bruising and maintain aggressive risk factor modification with strict control of hypertension and blood pressure goal below 130/90, diabetes with hemoglobin A1c goal below 6.5% and lipids with LDL cholesterol goal below 70 mg percent.  Encouraged her to use a cane  while ambulating outdoors and at all times and avoid getting up quickly and making sudden movements while turning.  Return for follow-up in the future with nurse practitioner in 6 to 8 months or call earlier if necessary.   I personally spent a total of 50 minutes in the care of the patient today including getting/reviewing separately obtained history, performing a medically appropriate exam/evaluation, counseling and educating, placing orders, referring and communicating with other health care professionals, documenting clinical information in the EHR, independently interpreting results, and coordinating care.        Eather Popp, MD Note: This document was prepared with digital dictation and possible smart phrase technology. Any transcriptional errors that result from this process are unintentional.

## 2024-06-13 ENCOUNTER — Encounter (HOSPITAL_COMMUNITY): Admitting: Physical Therapy

## 2024-06-13 LAB — HEMOGLOBIN A1C
Est. average glucose Bld gHb Est-mCnc: 148 mg/dL
Hgb A1c MFr Bld: 6.8 % — ABNORMAL HIGH (ref 4.8–5.6)

## 2024-06-13 LAB — LIPID PANEL
Chol/HDL Ratio: 2.3 ratio (ref 0.0–4.4)
Cholesterol, Total: 131 mg/dL (ref 100–199)
HDL: 57 mg/dL (ref >39)
LDL Chol Calc (NIH): 53 mg/dL (ref 0–99)
Triglycerides: 119 mg/dL (ref 0–149)
VLDL Cholesterol Cal: 21 mg/dL (ref 5–40)

## 2024-06-16 ENCOUNTER — Ambulatory Visit
Admission: RE | Admit: 2024-06-16 | Discharge: 2024-06-16 | Disposition: A | Discharge status: Home / Self Care | Source: Ambulatory Visit | Attending: Family Medicine | Admitting: Family Medicine

## 2024-06-16 DIAGNOSIS — W19XXXA Unspecified fall, initial encounter: Secondary | ICD-10-CM

## 2024-06-16 DIAGNOSIS — J341 Cyst and mucocele of nose and nasal sinus: Secondary | ICD-10-CM | POA: Diagnosis not present

## 2024-06-16 DIAGNOSIS — G319 Degenerative disease of nervous system, unspecified: Secondary | ICD-10-CM | POA: Diagnosis not present

## 2024-06-16 MED ORDER — GADOPICLENOL 0.5 MMOL/ML IV SOLN
7.5000 mL | Freq: Once | INTRAVENOUS | Status: AC | PRN
  Administered 2024-06-16: 7.5 mL via INTRAVENOUS

## 2024-06-17 DIAGNOSIS — Z23 Encounter for immunization: Secondary | ICD-10-CM | POA: Diagnosis not present

## 2024-06-17 DIAGNOSIS — I7774 Dissection of vertebral artery: Secondary | ICD-10-CM | POA: Diagnosis not present

## 2024-06-17 DIAGNOSIS — R300 Dysuria: Secondary | ICD-10-CM | POA: Diagnosis not present

## 2024-06-17 DIAGNOSIS — F411 Generalized anxiety disorder: Secondary | ICD-10-CM | POA: Diagnosis not present

## 2024-06-17 DIAGNOSIS — I700 Atherosclerosis of aorta: Secondary | ICD-10-CM | POA: Diagnosis not present

## 2024-06-17 DIAGNOSIS — I2510 Atherosclerotic heart disease of native coronary artery without angina pectoris: Secondary | ICD-10-CM | POA: Diagnosis not present

## 2024-06-17 DIAGNOSIS — E1122 Type 2 diabetes mellitus with diabetic chronic kidney disease: Secondary | ICD-10-CM | POA: Diagnosis not present

## 2024-06-17 DIAGNOSIS — N390 Urinary tract infection, site not specified: Secondary | ICD-10-CM | POA: Diagnosis not present

## 2024-06-17 DIAGNOSIS — I10 Essential (primary) hypertension: Secondary | ICD-10-CM | POA: Diagnosis not present

## 2024-06-17 DIAGNOSIS — I5030 Unspecified diastolic (congestive) heart failure: Secondary | ICD-10-CM | POA: Diagnosis not present

## 2024-06-18 ENCOUNTER — Encounter (HOSPITAL_COMMUNITY)

## 2024-06-20 DIAGNOSIS — T148XXA Other injury of unspecified body region, initial encounter: Secondary | ICD-10-CM | POA: Diagnosis not present

## 2024-06-23 ENCOUNTER — Ambulatory Visit: Payer: Self-pay | Admitting: Neurology

## 2024-06-25 ENCOUNTER — Ambulatory Visit: Admitting: Gastroenterology

## 2024-07-03 ENCOUNTER — Telehealth: Payer: Self-pay | Admitting: Pharmacist

## 2024-07-03 NOTE — Progress Notes (Signed)
   07/03/2024  Patient ID: [name], female   DOB: 04-10-1941, 83 y.o.   MRN: [id number]  From 10/14:  Called and spoke with the patient on the phone. Called to see if patient was using both [facility] or [facility] in [place]. Said she uses both. Advised the reason I called was because she has several medications past due at [facility] and wanted to see if she planned to refill them.  Atorvastatin  is past due on adherence metrics.  Patient said, can I call you back?SABRA Then abruptly heard a man in the back say, Get off the phone! and phone line disconnected. Will wait to see if phone call returned.   If not, will just have her past due meds refilled.  Called the pharmacy and had her Amlodipine , Glipizide , and Atorvastatin  refilled (all 28 days past due). As for the Imdur , they faxed the doctor on 9/24 and have not heard back from them yet. Advised she likely needs to complete a follow-up appointment first. Will get working on filling them now. Will call patient again next week if still not picked up yet.  From 10/22: Called the patient again to discuss. Meds filled on 10/15 and still not picked up. Unable to leave a voicemail.    Aloysius Lewis, PharmD, [facility] Phone Number: [phone]

## 2024-07-08 ENCOUNTER — Ambulatory Visit: Admitting: Neurology

## 2024-07-10 ENCOUNTER — Ambulatory Visit
Admission: RE | Admit: 2024-07-10 | Discharge: 2024-07-10 | Disposition: A | Discharge status: Home / Self Care | Source: Ambulatory Visit | Attending: Neurology | Admitting: Neurology

## 2024-07-10 ENCOUNTER — Telehealth: Payer: Self-pay | Admitting: Neurology

## 2024-07-10 ENCOUNTER — Ambulatory Visit
Admission: RE | Admit: 2024-07-10 | Discharge: 2024-07-10 | Disposition: A | Source: Ambulatory Visit | Attending: Neurology | Admitting: Neurology

## 2024-07-10 DIAGNOSIS — Z8673 Personal history of transient ischemic attack (TIA), and cerebral infarction without residual deficits: Secondary | ICD-10-CM | POA: Diagnosis not present

## 2024-07-10 MED ORDER — GADOPICLENOL 0.5 MMOL/ML IV SOLN
7.0000 mL | Freq: Once | INTRAVENOUS | Status: AC | PRN
  Administered 2024-07-10: 7 mL via INTRAVENOUS

## 2024-07-10 NOTE — Telephone Encounter (Signed)
 Request for contact info for [facility]

## 2024-07-26 DIAGNOSIS — M1812 Unilateral primary osteoarthritis of first carpometacarpal joint, left hand: Secondary | ICD-10-CM | POA: Diagnosis not present

## 2024-08-05 ENCOUNTER — Telehealth: Payer: Self-pay | Admitting: Neurology

## 2024-08-05 DIAGNOSIS — E1122 Type 2 diabetes mellitus with diabetic chronic kidney disease: Secondary | ICD-10-CM | POA: Diagnosis not present

## 2024-08-05 NOTE — Telephone Encounter (Signed)
Pt would like a phone call regarding her MRI results

## 2024-08-07 ENCOUNTER — Ambulatory Visit: Admitting: Neurology

## 2024-08-07 VITALS — BP 155/86 | HR 78

## 2024-08-07 DIAGNOSIS — Z8673 Personal history of transient ischemic attack (TIA), and cerebral infarction without residual deficits: Secondary | ICD-10-CM

## 2024-08-07 DIAGNOSIS — G629 Polyneuropathy, unspecified: Secondary | ICD-10-CM | POA: Diagnosis not present

## 2024-08-07 DIAGNOSIS — R296 Repeated falls: Secondary | ICD-10-CM | POA: Diagnosis not present

## 2024-08-07 NOTE — Progress Notes (Signed)
 Chief Complaint  Patient presents with   Follow-up    Pt in EMG room 4. Husband in room. Here for NCS.    ASSESSMENT AND PLAN  [name] is a 83 y.o. female   Gradual onset gait abnormality, frequent fall Right foot pain  EMG nerve conduction study showed only mild axonal sensorimotor polyneuropathy, most consistent with her history of diabetes, not severe enough to explain her gait complaints,  She do have significant right foot pain at metatarsophalangeal joints, which can certainly contributed to her gait complaint, I advised her foot massage, may consider podiatrist evaluation  DIAGNOSTIC DATA (LABS, IMAGING, TESTING) - I reviewed patient records, labs, notes, testing and imaging myself where available.   MEDICAL HISTORY:  [name] is a 83 year old female, accompanied by her husband, seen in request by Dr. Rosemarie for electrodiagnostic evaluation for her frequent fall, bilateral feet paresthesia, primary care is Dr. Leonel, [place],   History is obtained from the patient and review of electronic medical records. I personally reviewed pertinent available imaging films in PACS.   PMHx of  Hypertension Diabetes  Hyperlipidemia History of stroke  Since 2024, she noticed slow onset of gait abnormality, fall frequently, over past couple years, she did complaints bilateral feet numbness tingling, involving right foot more, right foot painful when bearing weight sometimes, denies upper extremity paresthesia or weakness, denies bowel and bladder incontinence  MRI of brain in October 2025, chronic small vessel disease, moderate, generalized atrophy mild cerebellar atrophy,  MRA head and neck intracranial atherosclerotic disease, no evidence of significant large vessel stenosis  ER evaluation May 31, 2024 for fall, she was pulling on her blanket in the bed try to stabilize her, but the blanket was not attached to anything, she fell backwards on the back of her head on the  carpeted floor  CT lumbar September 2025 no acute abnormality CT cervical no significant traumatic injury.  Multilevel degenerative changes CT head, no acute abnormality  Laboratory evaluations: A1c 6.8, a year ago was 10.4 Normal folic acid  vitamin B1, B6, B12, TSH, CPK 25, C-reactive protein ESR  EMG nerve conduction study August 07, 2024 showed mild axonal peripheral neuropathy mild right carpal tunnel syndrome.  PHYSICAL EXAM:   Vitals:   08/07/24 1112  BP: (!) 155/86  Pulse: 78   There is no height or weight on file to calculate BMI.  PHYSICAL EXAMNIATION:  Gen: NAD, conversant, well nourised, well groomed                     Cardiovascular: Regular rate rhythm, no peripheral edema, warm, nontender. Eyes: Conjunctivae clear without exudates or hemorrhage Neck: Supple, no carotid bruits. Pulmonary: Clear to auscultation bilaterally   NEUROLOGICAL EXAM:  MENTAL STATUS: Speech/cognition: Awake, alert, oriented to history taking and casual conversation CRANIAL NERVES: CN II: Visual fields are full to confrontation. Pupils are round equal and briskly reactive to light. CN III, IV, VI: extraocular movement are normal. No ptosis. CN V: Facial sensation is intact to light touch CN VII: Face is symmetric with normal eye closure  CN VIII: Hearing is normal to causal conversation. CN IX, X: Phonation is normal. CN XI: Head turning and shoulder shrug are intact  MOTOR: There is no pronator drift of out-stretched arms. Muscle bulk and tone are normal. Muscle strength is normal.  Significant tenderness of right foot ball area with deep palpitation  REFLEXES: Reflexes are hypoactive and symmetric at the biceps, triceps, knees, and absent  at ankles. Plantar responses are flexor.  SENSORY: Independent decreased light touch pinprick vibratory sensation to distal shin level  COORDINATION: There is no trunk or limb dysmetria noted.  GAIT/STANCE: Push-up, cautious, stiff small  stride  REVIEW OF SYSTEMS:  Full 14 system review of systems performed and notable only for as above All other review of systems were negative.   ALLERGIES: Allergies  Allergen Reactions   Ciprofloxacin  Nausea And Vomiting   Codeine Nausea And Vomiting   Doxycycline  Nausea And Vomiting   Oxycodone -Acetaminophen  Nausea And Vomiting   Tramadol  Nausea And Vomiting    HOME MEDICATIONS: Current Outpatient Medications  Medication Sig Dispense Refill   albuterol  (VENTOLIN  HFA) 108 (90 Base) MCG/ACT inhaler Inhale 2 puffs into the lungs every 6 (six) hours as needed. (Patient taking differently: Inhale 2 puffs into the lungs every 6 (six) hours as needed for wheezing or shortness of breath.) 8 g 0   amLODipine  (NORVASC ) 5 MG tablet Take 5 mg by mouth daily.     aspirin  EC 81 MG tablet Take 1 tablet (81 mg total) by mouth daily. Swallow whole. 30 tablet 12   atorvastatin  (LIPITOR ) 80 MG tablet Take 1 tablet (80 mg total) by mouth daily at 6 PM. 30 tablet 3   clonazePAM  (KLONOPIN ) 1 MG tablet Take 0.5-1 mg by mouth 2 (two) times daily as needed.     cyanocobalamin  1000 MCG tablet Take 1 tablet (1,000 mcg total) by mouth daily. 30 tablet 2   DULoxetine  (CYMBALTA ) 30 MG capsule Take 30 mg by mouth daily.     glipiZIDE  (GLUCOTROL  XL) 2.5 MG 24 hr tablet Take 1 tablet (2.5 mg total) by mouth daily with breakfast. 30 tablet 2   isosorbide  mononitrate (IMDUR ) 30 MG 24 hr tablet Take 30 mg by mouth daily.     metFORMIN  (GLUCOPHAGE ) 500 MG tablet Take 1 tablet (500 mg total) by mouth 2 (two) times daily with a meal. 60 tablet 2   montelukast  (SINGULAIR ) 10 MG tablet Take 1 tablet (10 mg total) by mouth at bedtime. 30 tablet 0   ondansetron  (ZOFRAN ) 4 MG tablet Take 4 mg by mouth every 8 (eight) hours as needed.     OZEMPIC, 0.25 OR 0.5 MG/DOSE, 2 MG/3ML SOPN SMARTSIG:0.25 Milligram(s) SUB-Q Once a Week     pantoprazole  (PROTONIX ) 40 MG tablet Take 30-60 min before first meal of the day (Patient taking  differently: Take 40 mg by mouth 2 (two) times daily. Take 30-60 min before first meal of the day)     No current facility-administered medications for this visit.    PAST MEDICAL HISTORY: Past Medical History:  Diagnosis Date   Aortic atherosclerosis    Asthma    Breast CA (HCC)    Cancer (HCC)    right breast mastectomy   CVA (cerebral vascular accident) (HCC) 04/27/2015   DDD (degenerative disc disease), cervical    Diabetes mellitus without complication (HCC)    Duodenal ulcer    External hemorrhoids    Generalized anxiety disorder    H/O chest pain 03/24/2006   normal nuclear  study   Headache    High cholesterol    Hypertension    Mild aortic insufficiency 03/28/2006   echo   Normal cardiac stress test 2005   Pancreatitis    Personal history of chemotherapy    Personal history of radiation therapy    PONV (postoperative nausea and vomiting)    Sinus bradycardia on ECG    Stroke (HCC)  PAST SURGICAL HISTORY: Past Surgical History:  Procedure Laterality Date   ABDOMINAL HYSTERECTOMY     APPENDECTOMY     30 years ago   childbirth     x 2   CHOLECYSTECTOMY     15 years ago   ESOPHAGOGASTRODUODENOSCOPY N/A 03/04/2014   Procedure: ESOPHAGOGASTRODUODENOSCOPY (EGD);  Surgeon: Belvie JONETTA Just, MD;  [facility] ENDOSCOPY;  Service: Endoscopy;  Laterality: N/A;   MASTECTOMY  1993   right breast   RIGHT/LEFT HEART CATH AND CORONARY ANGIOGRAPHY N/A 05/30/2019   Procedure: RIGHT/LEFT HEART CATH AND CORONARY ANGIOGRAPHY;  Surgeon: Dann Candyce RAMAN, MD;  Location[facility] INVASIVE CV LAB;  Service: Cardiovascular;  Laterality: N/A;   TUBAL LIGATION      FAMILY HISTORY: Family History  Problem Relation Age of Onset   Heart attack Mother    Heart disease Mother    Hypertension Mother    Asthma Mother    Heart attack Father    Heart disease Father    Hypertension Sister    Heart attack Sister    Colon cancer Sister 55   Hypertension Sister    Heart attack  Sister    Heart attack Sister    Lung disease Daughter    Thyroid  disease Daughter    Neuropathy Neg Hx    Stroke Neg Hx     SOCIAL HISTORY: Social History   Socioeconomic History   Marital status: Married    Spouse name: Tommy    Number of children: 2   Years of education: 12+   Highest education level: Not on file  Occupational History   Occupation: retired  Tobacco Use   Smoking status: Never   Smokeless tobacco: Never  Vaping Use   Vaping status: Never Used  Substance and Sexual Activity   Alcohol use: No    Alcohol/week: 0.0 standard drinks of alcohol   Drug use: No   Sexual activity: Not Currently  Other Topics Concern   Not on file  Social History Narrative   Right handed, caffeine  2 cups daily, married, 4 kids, Retired.  Hs Grad.    Social Drivers of Corporate Investment Banker Strain: Not on file  Food Insecurity: No Food Insecurity (03/17/2024)   Hunger Vital Sign    Worried About Running Out of Food in the Last Year: Never true    Ran Out of Food in the Last Year: Never true  Transportation Needs: No Transportation Needs (03/17/2024)   PRAPARE - Administrator, Civil Service (Medical): No    Lack of Transportation (Non-Medical): No  Physical Activity: Not on file  Stress: Not on file  Social Connections: Moderately Integrated (03/17/2024)   Social Connection and Isolation Panel    Frequency of Communication with Friends and Family: More than three times a week    Frequency of Social Gatherings with Friends and Family: More than three times a week    Attends Religious Services: Never    Database Administrator or Organizations: No    Attends Engineer, Structural: 1 to 4 times per year    Marital Status: Married  Catering Manager Violence: Not At Risk (03/17/2024)   Humiliation, Afraid, Rape, and Kick questionnaire    Fear of Current or Ex-Partner: No    Emotionally Abused: No    Physically Abused: No    Sexually Abused: No       Modena Callander, M.D. Ph.D.  [facility] [address], [address] [place], [place]  [address] Ph: [phone] Fax: [phone]  CC:  Leonel Cole, MD [address] [place],  [place] [address]  Leonel Cole, MD

## 2024-08-07 NOTE — Procedures (Signed)
 Full Name: [name] Gender: Female MRN #: [id number] Date of Birth: [birth year]    Visit Date: 08/07/2024 11:49 Age: 83 Years Examining Physician: Onita Duos Referring Physician: Onita Duos Height: 5 feet 4 inch History: 83 year old female presenting with gait abnormality bilateral feet numbness  Summary of the test: Nerve conduction study: Bilateral sural superficial peroneal sensory responses were absent. Right ulnar and radial sensory responses were within normal limit.  Right median sensory response showed mildly prolonged peak latency, with moderately decreased snap amplitude.  Right tibial motor response showed mildly decreased CMAP amplitude.  Left tibial motor responses were within normal limit.  Bilateral peroneal to EDB motor responses were within normal limit.  Right median and ulnar motor responses were normal.  Electromyography:  Selected needle examination of bilateral lower extremity muscle lumbosacral paraspinal muscles were performed.  There was no significant abnormality noted.   Conclusion:  This is an abnormal study.  There is electrodiagnostic evidence of mild length-dependent axonal sensorimotor polyneuropathy.  In addition, there is mild right carpal tunnel syndrome.   ------------------------------- Duos Onita. M.D. Ph.D.  [facility] [address], [address] [place], [place] [address] Tel: [phone] Fax: [phone]  Verbal informed consent was obtained from the patient, patient was informed of potential risk of procedure, including bruising, bleeding, hematoma formation, infection, muscle weakness, muscle pain, numbness, among others.        MNC    Nerve / Sites Muscle Latency Ref. Amplitude Ref. Rel Amp Segments Distance Velocity Ref. Area    ms ms mV mV %  cm m/s m/s mVms  R Median - APB     Wrist APB 3.5 <=4.4 6.1 >=4.0 100 Wrist - APB 7   18.8     Upper arm APB 8.1  5.1  84.5 Upper arm - Wrist 23 50 >=49  17.2  R Ulnar - ADM     Wrist ADM 2.9 <=3.3 6.6 >=6.0 100 Wrist - ADM 7   18.8     B.Elbow ADM 6.0  6.5  98.5 B.Elbow - Wrist 18 58 >=49 20.3     A.Elbow ADM 8.1  6.7  103 A.Elbow - B.Elbow 11 53 >=49 21.6  R Peroneal - EDB     Ankle EDB 4.9 <=6.5 3.6 >=2.0 100 Ankle - EDB 9   12.2     Fib head EDB 11.9  3.1  88.5 Fib head - Ankle 29 41 >=44 10.9     Pop fossa EDB 14.4  3.6  113 Pop fossa - Fib head 10 41 >=44 14.9         Pop fossa - Ankle      L Peroneal - EDB     Ankle EDB 5.1 <=6.5 2.6 >=2.0 100 Ankle - EDB 9   12.2     Fib head EDB 11.3  2.3  91.6 Fib head - Ankle 25 41 >=44 11.4     Pop fossa EDB 14.5  2.4  102 Pop fossa - Fib head 14 44 >=44 13.8         Pop fossa - Ankle      R Tibial - AH     Ankle AH 5.4 <=5.8 2.4 >=4.0 100 Ankle - AH 9   7.7     Pop fossa AH 15.3  1.4  59.5 Pop fossa - Ankle 45 45 >=41 5.5  L Tibial - AH     Ankle AH 6.3 <=5.8 6.9 >=4.0 100 Ankle -  AH 9   17.8     Pop fossa AH 16.1  5.9  85 Pop fossa - Ankle 42 43 >=41 17.6                 SNC    Nerve / Sites Rec. Site Peak Lat Ref.  Amp Ref. Segments Distance    ms ms V V  cm  R Radial - Anatomical snuff box (Forearm)     Forearm Wrist 2.1 <=2.9 19 >=15 Forearm - Wrist 10  R Sural - Ankle (Calf)     Calf Ankle NR <=4.4 NR >=6 Calf - Ankle 14  L Sural - Ankle (Calf)     Calf Ankle NR <=4.4 NR >=6 Calf - Ankle 14  R Superficial peroneal - Ankle     Lat leg Ankle NR <=4.4 NR >=6 Lat leg - Ankle 14  L Superficial peroneal - Ankle     Lat leg Ankle NR <=4.4 NR >=6 Lat leg - Ankle 14  R Median - Orthodromic (Dig II, Mid palm)     Dig II Wrist 3.6 <=3.4 5 >=10 Dig II - Wrist 13  R Ulnar - Orthodromic, (Dig V, Mid palm)     Dig V Wrist 2.7 <=3.1 7 >=5 Dig V - Wrist 65                   F  Wave    Nerve F Lat Ref.   ms ms  R Tibial - AH 25.0 <=56.0  L Tibial - AH 51.7 <=56.0  R Median - APB 29.9 <=31.0  R Ulnar - ADM 28.4 <=32.0             EMG Summary Table    Spontaneous MUAP Recruitment   Muscle IA Fib PSW Fasc Other Amp Dur. Poly Pattern  R. Tibialis anterior Normal None None None _______ Normal Normal Normal Normal  R. Tibialis posterior Normal None None None _______ Normal Normal Normal Normal  R. Peroneus longus Normal None None None _______ Normal Normal Normal Normal  R. Gastrocnemius (Medial head) Normal None None None _______ Normal Normal Normal Normal  R. Vastus lateralis Normal None None None _______ Normal Normal Normal Normal  R. Iliopsoas Normal None None None _______ Normal Normal Normal Normal  L. Tibialis anterior Normal None None None _______ Normal Normal Normal Normal  L. Tibialis posterior Normal None None None _______ Normal Normal Normal Normal  L. Peroneus longus Normal None None None _______ Normal Normal Normal Normal  L. Gastrocnemius (Medial head) Normal None None None _______ Normal Normal Normal Normal  L. Vastus lateralis Normal None None None _______ Normal Normal Normal Normal  L. Lumbar paraspinals (low) Normal None None None _______ Normal Normal Normal Normal  L. Lumbar paraspinals (mid) Normal None None None _______ Normal Normal Normal Normal  R. Lumbar paraspinals (low) Normal None None None _______ Normal Normal Normal Normal  R. Lumbar paraspinals (mid) Normal None None None _______ Normal Normal Normal Normal

## 2024-08-12 ENCOUNTER — Telehealth: Payer: Self-pay

## 2024-08-12 ENCOUNTER — Other Ambulatory Visit

## 2024-08-12 ENCOUNTER — Encounter: Payer: Self-pay | Admitting: Gastroenterology

## 2024-08-12 ENCOUNTER — Ambulatory Visit: Admitting: Gastroenterology

## 2024-08-12 VITALS — BP 124/70 | HR 80 | Ht 64.0 in | Wt 153.0 lb

## 2024-08-12 DIAGNOSIS — E1165 Type 2 diabetes mellitus with hyperglycemia: Secondary | ICD-10-CM | POA: Diagnosis not present

## 2024-08-12 DIAGNOSIS — F411 Generalized anxiety disorder: Secondary | ICD-10-CM | POA: Diagnosis not present

## 2024-08-12 DIAGNOSIS — M810 Age-related osteoporosis without current pathological fracture: Secondary | ICD-10-CM | POA: Diagnosis not present

## 2024-08-12 DIAGNOSIS — R159 Full incontinence of feces: Secondary | ICD-10-CM | POA: Diagnosis not present

## 2024-08-12 DIAGNOSIS — R195 Other fecal abnormalities: Secondary | ICD-10-CM

## 2024-08-12 DIAGNOSIS — E1122 Type 2 diabetes mellitus with diabetic chronic kidney disease: Secondary | ICD-10-CM | POA: Diagnosis not present

## 2024-08-12 DIAGNOSIS — R110 Nausea: Secondary | ICD-10-CM | POA: Diagnosis not present

## 2024-08-12 DIAGNOSIS — I7774 Dissection of vertebral artery: Secondary | ICD-10-CM | POA: Diagnosis not present

## 2024-08-12 DIAGNOSIS — I10 Essential (primary) hypertension: Secondary | ICD-10-CM | POA: Diagnosis not present

## 2024-08-12 DIAGNOSIS — J45909 Unspecified asthma, uncomplicated: Secondary | ICD-10-CM | POA: Diagnosis not present

## 2024-08-12 DIAGNOSIS — R152 Fecal urgency: Secondary | ICD-10-CM

## 2024-08-12 DIAGNOSIS — J439 Emphysema, unspecified: Secondary | ICD-10-CM | POA: Diagnosis not present

## 2024-08-12 DIAGNOSIS — Z0000 Encounter for general adult medical examination without abnormal findings: Secondary | ICD-10-CM | POA: Diagnosis not present

## 2024-08-12 DIAGNOSIS — I725 Aneurysm of other precerebral arteries: Secondary | ICD-10-CM | POA: Diagnosis not present

## 2024-08-12 DIAGNOSIS — G319 Degenerative disease of nervous system, unspecified: Secondary | ICD-10-CM | POA: Diagnosis not present

## 2024-08-12 DIAGNOSIS — I5030 Unspecified diastolic (congestive) heart failure: Secondary | ICD-10-CM | POA: Diagnosis not present

## 2024-08-12 DIAGNOSIS — I700 Atherosclerosis of aorta: Secondary | ICD-10-CM | POA: Diagnosis not present

## 2024-08-12 NOTE — Patient Instructions (Signed)
 _______________________________________________________  If your blood pressure at your visit was 140/90 or greater, please contact your primary care physician to follow up on this.  _______________________________________________________  If you are age 84 or older, your body mass index should be between 23-30. Your Body mass index is 26.26 kg/m. If this is out of the aforementioned range listed, please consider follow up with your Primary Care Provider.  If you are age 63 or younger, your body mass index should be between 19-25. Your Body mass index is 26.26 kg/m. If this is out of the aformentioned range listed, please consider follow up with your Primary Care Provider.   ________________________________________________________  The [facility] providers would like to encourage you to use MYCHART to communicate with providers for non-urgent requests or questions.  Due to long hold times on the telephone, sending your provider a message by [facility] may be a faster and more efficient way to get a response.  Please allow 48 business hours for a response.  Please remember that this is for non-urgent requests.  _______________________________________________________  [facility] is using a team-based approach to care.  Your team is made up of your doctor and two to three APPS. Our APPS (Nurse Practitioners and Physician Assistants) work with your physician to ensure care continuity for you. They are fully qualified to address your health concerns and develop a treatment plan. They communicate directly with your gastroenterologist to care for you. Seeing the Advanced Practice Practitioners on your physician's team can help you by facilitating care more promptly, often allowing for earlier appointments, access to diagnostic testing, procedures, and other specialty referrals.   Your provider has requested that you go to the basement level for lab work before leaving today. Press B on the  elevator. The lab is located at the first door on the left as you exit the elevator.  You have been scheduled for an endoscopy. Please follow written instructions given to you at your visit today.  If you use inhalers (even only as needed), please bring them with you on the day of your procedure.  If you take any of the following medications, they will need to be adjusted prior to your procedure:   DO NOT TAKE 7 DAYS PRIOR TO TEST- Trulicity (dulaglutide) Ozempic, Wegovy (semaglutide) Mounjaro, Zepbound (tirzepatide) Bydureon Bcise (exanatide extended release)  DO NOT TAKE 1 DAY PRIOR TO YOUR TEST Rybelsus (semaglutide) Adlyxin (lixisenatide) Victoza (liraglutide) Byetta (exanatide) ___________________________________________________________________________  You have been scheduled to have an anorectal manometry at [facility] on 09-11-24 at 10:30am. Please arrive 30 minutes prior to your appointment time for registration (1st floor of the hospital-admissions).  Please make certain to use 1 Fleets enema 2 hours prior to coming for your appointment. You can purchase Fleets enemas from the laxative section at your drug store. You should not eat anything during the two hours prior to the procedure. You may take regular medications with small sips of water at least 2 hours prior to the study.  Anorectal manometry is a test performed to evaluate patients with constipation or fecal incontinence. This test measures the pressures of the anal sphincter muscles, the sensation in the rectum, and the neural reflexes that are needed for normal bowel movements.  THE PROCEDURE The test takes approximately 30 minutes to 1 hour. You will be asked to change into a hospital gown. A technician or nurse will explain the procedure to you, take a brief health history, and answer any questions you may have. The patient  then lies on his or her left side. A small, flexible tube, about the size of a  thermometer, with a balloon at the end is inserted into the rectum. The catheter is connected to a machine that measures the pressure. During the test, the small balloon attached to the catheter may be inflated in the rectum to assess the normal reflex pathways. The nurse or technician may also ask the person to squeeze, relax, and push at various times. The anal sphincter muscle pressures are measured during each of these maneuvers. To squeeze, the patient tightens the sphincter muscles as if trying to prevent anything from coming out. To push or bear down, the patient strains down as if trying to have a bowel movement.  Due to recent changes in healthcare laws, you may see the results of your imaging and laboratory studies on MyChart before your provider has had a chance to review them.  We understand that in some cases there may be results that are confusing or concerning to you. Not all laboratory results come back in the same time frame and the provider may be waiting for multiple results in order to interpret others.  Please give us  48 hours in order for your provider to thoroughly review all the results before contacting the office for clarification of your results.   It was a pleasure to see you today!  [name], D.O.

## 2024-08-12 NOTE — Telephone Encounter (Signed)
  [name] 1941-03-07 [id number]  08-12-24   Dear Dr Rosemarie,  We have scheduled the above named patient for an upper endoscopy procedure. Our records show that (s)he is on antiplatelet therapy.  Please advise as to whether the patient may come off their therapy of Plavix  5 days prior to their procedure which is scheduled for 08-21-24.  Please route your response to Nat Sic, CMA or fax response to [phone].  Sincerely,    [facility]

## 2024-08-12 NOTE — Progress Notes (Signed)
 Chief Complaint:    Nausea, diarrhea, change in bowel habits  GI History: 83 year old female with history of asthma, breast cancer s/p mastectomy, DDD, CVA, prior PUD   CT A/P w/contrast 12/23/21: IMPRESSION: 1. Findings above consistent with infectious or inflammatory proctitis. 2.  Aortic Atherosclerosis (ICD10-I70.0).   EGD 03/04/14: IMPRESSION: 1) Duodenal ulcers - nonbleeding. 2) Gastritis.   EGD 02/24/22: - White nummular lesions in esophageal mucosa. Biopsied. - Multiple gastric polyps. Biopsied. - Gastritis. Biopsied. - Duodenitis. Biopsied. Path: 1. Surgical [P], duodenal - DUODENAL MUCOSA WITH PROMINENT BRUNNER'S GLANDS AND FOCAL FOVEOLAR METAPLASIA CONSISTENT WITH CHRONIC PEPTIC DUODENITIS. 2. Surgical [P], gastric - ANTRAL MUCOSA WITH FEATURES OF BOTH CHEMICAL/REACTIVE CHANGE AND MILD CHRONIC INACTIVE GASTRITIS. - OXYNTIC MUCOSA WITH NO SIGNIFICANT PATHOLOGY. - NO HELICOBACTER PYLORI ORGANISMS IDENTIFIED ON H&E STAINED SLIDE. 3. Surgical [P], gastric polyps - FUNDIC GLAND POLYP. 4. Surgical [P], esophagus - SQUAMOUS MUCOSA WITH NO SIGNIFICANT PATHOLOGY.   Flexible sigmoidoscopy 02/24/22: - Five 3 to 6 mm polyps in the sigmoid colon, in the descending colon and in the transverse colon, removed with a cold snare. Resected and retrieved. - One 11 mm polyp in the sigmoid colon, removed with a cold snare. Resected and retrieved. - Non-bleeding internal hemorrhoids. Path: 5. Surgical [P], colon, sigmoid, descending, and transverse - FRAGMENTS OF LOW-GRADE DYSPLASIA/TUBULAR ADENOMATOUS EPITHELIUM. - FRAGMENTS OF SESSILE SERRATED POLYP (GIVEN THE MULTIPLICITY OF POLYPS AND FRAGMENTED NATURE OF THE SPECIMEN, IT IS NOT MORPHOLOGICALLY POSSIBLE TO DISTINGUISH SEPARATE SESSILE SERRATED POLYPS AND TUBULAR ADENOMAS FROM POSSIBLE SESSILE SERRATED POLYPS WITH DYSPLASIA). - AN IMMUNOHISTOCHEMICAL STAIN FOR MLH1 SHOWS NO LOSS OF STAINING. 6. Surgical [P], colon, sigmoid, polyp  (1) - TUBULAR ADENOMA.  HPI:     Patient is a 83 y.o. female presenting to the Gastroenterology Clinic for evaluation of nausea and change in bowel habits.  Has had nausea for the last 6 months.  No emesis.  No abdominal pain, but nausea worse if she presses on her subxiphoid. Started Zofran  6 months ago and helps. Sxs occur 3 times/week. Will use Pepto if mild sxs.   Was seen by Dr. Federico for nausea in 03/2022.  Was prescribed pantoprazole  40 mg twice daily (prescribed for gastritis/duodenitis on EGD) with some overall improvement in nausea and abdominal discomfort at that time.  Had also used Zofran  on demand, Pepto-Bismol on demand.  PPI was reduced to 40 mg daily.  Separately, episodic loose, nonbloody stools. Sxs occur 2 times/week. [place]o described as episodic urgency with loose stools, and has had episodes of fecal incontinence/seepage.  When these episodes occur, she will use Imodium and no further BM for the rest of that day.  If she does not use Imodium, will typically have 1 more loose BM that day, then symptoms back to baseline.  Baseline is otherwise 1 BM daily.  No hematochezia, melena.  No associated abdominal pain with the symptoms.  Has not tried dietary mods, fiber, etc.   Just started Ozempic last month, and course of Abx 2 months ago for UTI, but both of these were well after sxs onset. Otherwise, no new medications. No longer taking metformin  since starting Ozempic.    Per prior conversation with Dr. Federico, patient considering repeat flexible sigmoidoscopy for polyp surveillance in 02/2025.  Reviewed most recent labs from 03/2024: Normal CBC, BG 262 otherwise normal CMP, normal folate, B1, B6, B12, TSH, ESR/CRP.  Reviewed CT A/P from 04/19/2024 (indication: Epigastric pain, nausea): And notable for small midline enteral hernia  which only contains fat, otherwise normal visualized GI tract.   Review of systems:     No chest pain, no SOB, no fevers, no urinary sx   Past  Medical History:  Diagnosis Date   Aortic atherosclerosis    Asthma    Breast CA (HCC)    Cancer (HCC)    right breast mastectomy   CVA (cerebral vascular accident) (HCC) 04/27/2015   DDD (degenerative disc disease), cervical    Diabetes mellitus without complication (HCC)    Duodenal ulcer    External hemorrhoids    Generalized anxiety disorder    H/O chest pain 03/24/2006   normal nuclear  study   Headache    High cholesterol    Hypertension    Mild aortic insufficiency 03/28/2006   echo   Normal cardiac stress test 2005   Pancreatitis    Personal history of chemotherapy    Personal history of radiation therapy    PONV (postoperative nausea and vomiting)    Sinus bradycardia on ECG    Stroke [facility])     Patient's surgical history, family medical history, social history, medications and allergies were all reviewed in Epic    Current Outpatient Medications  Medication Sig Dispense Refill   albuterol  (VENTOLIN  HFA) 108 (90 Base) MCG/ACT inhaler Inhale 2 puffs into the lungs every 6 (six) hours as needed. (Patient taking differently: Inhale 2 puffs into the lungs every 6 (six) hours as needed for wheezing or shortness of breath.) 8 g 0   amLODipine  (NORVASC ) 5 MG tablet Take 5 mg by mouth daily.     aspirin  EC 81 MG tablet Take 1 tablet (81 mg total) by mouth daily. Swallow whole. 30 tablet 12   atorvastatin  (LIPITOR ) 80 MG tablet Take 1 tablet (80 mg total) by mouth daily at 6 PM. 30 tablet 3   clobetasol  (TEMOVATE ) 0.05 % external solution Apply 1 Application topically as needed.     clonazePAM  (KLONOPIN ) 1 MG tablet Take 0.5-1 mg by mouth 2 (two) times daily as needed.     clopidogrel  (PLAVIX ) 75 MG tablet Take 75 mg by mouth daily.     cyanocobalamin  1000 MCG tablet Take 1 tablet (1,000 mcg total) by mouth daily. 30 tablet 2   DULoxetine  (CYMBALTA ) 30 MG capsule Take 30 mg by mouth daily.     glipiZIDE  (GLUCOTROL  XL) 2.5 MG 24 hr tablet Take 1 tablet (2.5 mg total) by mouth  daily with breakfast. 30 tablet 2   isosorbide  mononitrate (IMDUR ) 30 MG 24 hr tablet Take 30 mg by mouth daily.     metFORMIN  (GLUCOPHAGE ) 500 MG tablet Take 1 tablet (500 mg total) by mouth 2 (two) times daily with a meal. 60 tablet 2   montelukast  (SINGULAIR ) 10 MG tablet Take 1 tablet (10 mg total) by mouth at bedtime. 30 tablet 0   ondansetron  (ZOFRAN ) 4 MG tablet Take 4 mg by mouth every 8 (eight) hours as needed.     ONETOUCH VERIO test strip 1 each by Other route as needed.     OZEMPIC, 0.25 OR 0.5 MG/DOSE, 2 MG/3ML SOPN SMARTSIG:0.25 Milligram(s) SUB-Q Once a Week     pantoprazole  (PROTONIX ) 40 MG tablet Take 30-60 min before first meal of the day (Patient taking differently: Take 40 mg by mouth 2 (two) times daily. Take 30-60 min before first meal of the day)     No current facility-administered medications for this visit.    Physical Exam:     BP 124/70  Pulse 80   Ht 5' 4 (1.626 m)   Wt 153 lb (69.4 kg)   SpO2 96%   BMI 26.26 kg/m   GENERAL:  Pleasant female in NAD PSYCH: : Cooperative, normal affect CARDIAC:  RRR, no murmur heard, no peripheral edema PULM: Normal respiratory effort, lungs CTA bilaterally, no wheezing ABDOMEN:  Nondistended, soft, nontender. No obvious masses, no hepatomegaly,  normal bowel sounds SKIN:  turgor, no lesions seen Musculoskeletal:  Normal muscle tone, normal strength NEURO: Alert and oriented x 3, no focal neurologic deficits   IMPRESSION and PLAN:    1) Change in bowel habits 2) Fecal urgency 3) Fecal incontinence Does not necessarily have diarrhea, but rather episodes of swift fecal urgency that results in loose stools and occasions of fecal incontinence/seepage.  Higher suspicion that she has impaired sensation contributing to her symptoms rather than secretory or osmotic issue, but plan for workup as follows:  - Check fecal pancreatic elastase and fecal calprotectin - Referral for ARM - Recent CT otherwise unremarkable -  Discussed the role/utility of colonoscopy with biopsy.  She would like to hold off for now pending the above studies first  4) Nausea Nausea for the last 6 months, but also with a prior history of nausea.  EGD in 2023 when she was having similar symptoms n/f peptic duodenitis and gastritis.  She does have multiple medications on her med list which could contribute to nausea.  No longer taking metformin , and Ozempic was started well after symptoms onset. - Continue Zofran  as this seems to work well - EGD to evaluate for gastritis, PUD, GOO, etc.  5) Diabetes - Hold Ozempic 7 days prior to EGD     Can follow-up with Dr. Federico or myself in 3 months or sooner prn  The indications, risks, and benefits of EGD were explained to the patient in detail. Risks include but are not limited to bleeding, perforation, adverse reaction to medications, and cardiopulmonary compromise. Sequelae include but are not limited to the possibility of surgery, hospitalization, and mortality. The patient verbalized understanding and wished to proceed. All questions answered, referred to scheduler. Further recommendations pending results of the exam.        Sandor LULLA Flatter ,DO, FACG 08/12/2024, 2:39 PM

## 2024-08-13 NOTE — Telephone Encounter (Signed)
 Dr Rosemarie,   Thank you for your response, but we need clearance to hold Plavix  for 5 days instead of Aspirin .  Could you please give recommendations on Plavix  hold.  Thank you, Nat

## 2024-08-13 NOTE — Telephone Encounter (Signed)
 Patient called to advised of Plavix  hold.   Patient wished to reschedule EGD from 08-21-24 to 09-27-24.  New instructions sent to patient via MyChart.    Patient advised she has been given clearance to hold Plavix  5 days prior to EGD scheduled for 09-27-24. Patient advised to take last dose of Plavix  on 09-21-24, and she will be advised when to restart Plavix  by Dr San after the procedure.  Patient agreed to plan and verbalized understanding.  No further questions.

## 2024-08-21 ENCOUNTER — Encounter: Admitting: Gastroenterology

## 2024-08-26 NOTE — Telephone Encounter (Signed)
 PT called in very much confused about the anorectal manometry and the EGD she has been scheduled for. She would like to further discuss what she has to do for these procedures and why. Please advise.

## 2024-08-26 NOTE — Telephone Encounter (Signed)
 Returned call to the patient who was confused about where she should report for anorectal manometry on 09-11-24 and for EGD on 09-27-24.  Patient was given clear instructions to go to [facility] on 09-11-24 and she should come to our office on 09-27-24 for EGD.  Patient also advised to refer to her AVS handout from visit on 08-13-24 and MyChart for further assistance.  Patient agreed to plan and verbalized understanding.  No further questions.

## 2024-09-02 ENCOUNTER — Other Ambulatory Visit: Payer: Self-pay

## 2024-09-02 ENCOUNTER — Encounter (HOSPITAL_COMMUNITY): Payer: Self-pay | Admitting: Gastroenterology

## 2024-09-02 NOTE — Progress Notes (Addendum)
 Anesthesia Review:  PCP: Cheryle Frees  Neuro- Sethi- LOV 06/12/24  Cardiologist :[name]- 03/01/23   PPM/ ICD: Device Orders: Rep Notified:  Chest x-ray :- 03/16/24- 2 view  EKG :03/16/24  Echo : 10/02/22  Ct cors- 2020  Stress test: 2019  Cardiac Cath :  2020   Activity level:  Sleep Study/ CPAP : Fasting Blood Sugar :      / Checks Blood Sugar -- times a day:     DM- type 2 - checks glucose daily  Glucotrol - none nite beofre and none am of procedure  Metformin - none am of procedure  Ozempic - last dose on  09/01/2024  Hgba1c-06/12/24-6.5   Blood Thinner/ Instructions /Last Dose: ASA / Instructions/ Last Dose :   81 mg aspirin   Plavix - last dose on 7  days prior to procedure    Hx of stroke - pt states hands are numb  Also states hx of falls- ? Related to scar tissue on brain  NO B/P etc in right arm PT does not want any more anethesia than she has to have.

## 2024-09-03 ENCOUNTER — Other Ambulatory Visit

## 2024-09-03 DIAGNOSIS — R159 Full incontinence of feces: Secondary | ICD-10-CM

## 2024-09-03 DIAGNOSIS — R195 Other fecal abnormalities: Secondary | ICD-10-CM

## 2024-09-07 LAB — CALPROTECTIN, FECAL: Calprotectin, Fecal: 179 ug/g — ABNORMAL HIGH (ref 0–120)

## 2024-09-08 LAB — PANCREATIC ELASTASE, FECAL: Pancreatic Elastase-1, Stool: >800 ug/g

## 2024-09-10 ENCOUNTER — Ambulatory Visit: Payer: Self-pay | Admitting: Gastroenterology

## 2024-09-11 ENCOUNTER — Encounter (HOSPITAL_COMMUNITY)
Admission: RE | Disposition: A | Discharge status: Home / Self Care | Payer: Self-pay | Source: Home / Self Care | Attending: Gastroenterology

## 2024-09-11 ENCOUNTER — Ambulatory Visit (HOSPITAL_COMMUNITY)
Admission: RE | Admit: 2024-09-11 | Discharge: 2024-09-11 | Disposition: A | Discharge status: Home / Self Care | Attending: Gastroenterology | Admitting: Gastroenterology

## 2024-09-11 ENCOUNTER — Other Ambulatory Visit: Payer: Self-pay | Admitting: Neurosurgery

## 2024-09-11 DIAGNOSIS — I671 Cerebral aneurysm, nonruptured: Secondary | ICD-10-CM

## 2024-09-11 DIAGNOSIS — R159 Full incontinence of feces: Secondary | ICD-10-CM | POA: Diagnosis present

## 2024-09-11 DIAGNOSIS — R194 Change in bowel habit: Secondary | ICD-10-CM | POA: Diagnosis not present

## 2024-09-11 DIAGNOSIS — K6289 Other specified diseases of anus and rectum: Secondary | ICD-10-CM | POA: Diagnosis not present

## 2024-09-11 DIAGNOSIS — K5902 Outlet dysfunction constipation: Secondary | ICD-10-CM | POA: Diagnosis not present

## 2024-09-11 HISTORY — DX: Unspecified osteoarthritis, unspecified site: M19.90

## 2024-09-11 HISTORY — DX: Family history of other specified conditions: Z84.89

## 2024-09-11 HISTORY — PX: ANAL RECTAL MANOMETRY: SHX6358

## 2024-09-11 HISTORY — DX: Dyspnea, unspecified: R06.00

## 2024-09-11 NOTE — Progress Notes (Signed)
Anal manometry done per protocol. Pt tolerated well without distress or complication   

## 2024-09-12 ENCOUNTER — Encounter (HOSPITAL_COMMUNITY): Payer: Self-pay | Admitting: Gastroenterology

## 2024-09-13 ENCOUNTER — Ambulatory Visit
Admission: RE | Admit: 2024-09-13 | Discharge: 2024-09-13 | Disposition: A | Discharge status: Home / Self Care | Source: Ambulatory Visit | Attending: Neurosurgery | Admitting: Neurosurgery

## 2024-09-13 DIAGNOSIS — I671 Cerebral aneurysm, nonruptured: Secondary | ICD-10-CM

## 2024-09-13 MED ORDER — IOPAMIDOL (ISOVUE-370) INJECTION 76%
75.0000 mL | Freq: Once | INTRAVENOUS | Status: AC | PRN
  Administered 2024-09-13: 75 mL via INTRAVENOUS

## 2024-09-16 ENCOUNTER — Other Ambulatory Visit

## 2024-09-19 ENCOUNTER — Other Ambulatory Visit

## 2024-09-20 ENCOUNTER — Telehealth: Payer: Self-pay | Admitting: Gastroenterology

## 2024-09-20 NOTE — Telephone Encounter (Signed)
 Patient had questions regarding what medications she should hold prior to EGD.  Medications such as Plavix , metformin , and Ozempic holds were explained to the patient and she verbalized understanding.  No further questions.

## 2024-09-20 NOTE — Telephone Encounter (Signed)
 Call from pt stating she lost prep instruction for EGD and cannot access MyChart. Pt requesting call to discuss instruction. Please advise. Thank you.

## 2024-09-24 NOTE — Telephone Encounter (Signed)
 "  Patient made aware.  "

## 2024-09-24 NOTE — Telephone Encounter (Signed)
 Asked patient if she was still having symptoms since her fecal calprotectin is elevated and she said she has been going to the bathroom a lot for instance she hasn't eaten since yesterday 5pm and woke up this morning having to go to the bathroom and she said her stool is black, its not the normal stool color. She said she takes pepto bismol tablets as needed. I told her we don't have the manometry results as of yet and she said she was told that we do and I told her that those takes 2-3 week to return and we don't have them right now and she said she received a message saying we did and I told her that when I sent the mychart message on 09-13-24 we didn't have her results back and she said she will ask this Friday about them because I should know them and I said that's fine because I can't give her results on this Please advise

## 2024-09-26 ENCOUNTER — Ambulatory Visit: Payer: Self-pay | Admitting: Gastroenterology

## 2024-09-26 DIAGNOSIS — K5902 Outlet dysfunction constipation: Secondary | ICD-10-CM

## 2024-09-27 ENCOUNTER — Encounter: Payer: Self-pay | Admitting: Gastroenterology

## 2024-09-27 ENCOUNTER — Ambulatory Visit: Admitting: Gastroenterology

## 2024-09-27 VITALS — BP 139/58 | HR 71 | Temp 97.4°F | Resp 21 | Ht 64.0 in | Wt 153.0 lb

## 2024-09-27 DIAGNOSIS — R195 Other fecal abnormalities: Secondary | ICD-10-CM

## 2024-09-27 DIAGNOSIS — K2950 Unspecified chronic gastritis without bleeding: Secondary | ICD-10-CM | POA: Diagnosis not present

## 2024-09-27 DIAGNOSIS — K317 Polyp of stomach and duodenum: Secondary | ICD-10-CM | POA: Diagnosis not present

## 2024-09-27 DIAGNOSIS — K2970 Gastritis, unspecified, without bleeding: Secondary | ICD-10-CM

## 2024-09-27 DIAGNOSIS — R110 Nausea: Secondary | ICD-10-CM

## 2024-09-27 DIAGNOSIS — D131 Benign neoplasm of stomach: Secondary | ICD-10-CM

## 2024-09-27 MED ORDER — SODIUM CHLORIDE 0.9 % IV SOLN: 500.0000 mL | Freq: Once | INTRAVENOUS | Status: DC

## 2024-09-27 NOTE — Progress Notes (Signed)
 "   GASTROENTEROLOGY PROCEDURE H&P NOTE   Primary Care Physician: Leonel Cole, MD    Reason for Procedure:  Nausea, change in bowel habits, history of gastritis and duodenal ulcers  Plan:    EGD  Patient is appropriate for endoscopic procedure(s) in the ambulatory ([facility]) setting.  The nature of the procedure, as well as the risks, benefits, and alternatives were carefully and thoroughly reviewed with the patient. Ample time for discussion and questions allowed. The patient understood, was satisfied, and agreed to proceed. I personally addressed all patient questions and concerns.     HPI: [name] is a 84 y.o. female who presents for EGD for evaluation of chronic nausea without emesis.    Past Medical History:  Diagnosis Date   Aortic atherosclerosis    Arthritis    Asthma    Breast CA (HCC)    Cancer (HCC)    right breast mastectomy   CVA (cerebral vascular accident) (HCC) 04/27/2015   DDD (degenerative disc disease), cervical    Diabetes mellitus without complication (HCC)    Duodenal ulcer    Dyspnea    External hemorrhoids    Family history of adverse reaction to anesthesia    dtr has sarcoidosis   H/O chest pain 03/24/2006   normal nuclear  study   High cholesterol    Hypertension    Mild aortic insufficiency 03/28/2006   echo   Normal cardiac stress test 2005   Pancreatitis    Personal history of chemotherapy    Personal history of radiation therapy    PONV (postoperative nausea and vomiting)    Sinus bradycardia on ECG    Stroke [facility])     Past Surgical History:  Procedure Laterality Date   ABDOMINAL HYSTERECTOMY     ANAL RECTAL MANOMETRY N/A 09/11/2024   Procedure: MANOMETRY, ANORECTAL;  Surgeon: San Sandor GAILS, DO;  Location: [facility];  Service: Gastroenterology;  Laterality: N/A;   APPENDECTOMY     30 years ago   childbirth     x 2   CHOLECYSTECTOMY     15 years ago   ESOPHAGOGASTRODUODENOSCOPY N/A 03/04/2014   Procedure:  ESOPHAGOGASTRODUODENOSCOPY (EGD);  Surgeon: Belvie JONETTA Just, MD;  Location: [facility] ENDOSCOPY;  Service: Endoscopy;  Laterality: N/A;   MASTECTOMY  1993   right breast   RIGHT/LEFT HEART CATH AND CORONARY ANGIOGRAPHY N/A 05/30/2019   Procedure: RIGHT/LEFT HEART CATH AND CORONARY ANGIOGRAPHY;  Surgeon: Dann Candyce RAMAN, MD;  Location: [facility] INVASIVE CV LAB;  Service: Cardiovascular;  Laterality: N/A;   TUBAL LIGATION      Prior to Admission medications  Medication Sig Start Date End Date Taking? Authorizing Provider  albuterol  (VENTOLIN  HFA) 108 (90 Base) MCG/ACT inhaler Inhale 2 puffs into the lungs every 6 (six) hours as needed. Patient taking differently: Inhale 2 puffs into the lungs every 6 (six) hours as needed for wheezing or shortness of breath. 02/08/24   Leath-Warren, Etta PARAS, NP  amLODipine  (NORVASC ) 5 MG tablet Take 5 mg by mouth daily.    [provider]  aspirin  EC 81 MG tablet Take 1 tablet (81 mg total) by mouth daily. Swallow whole. 06/12/24   Rosemarie Eather RAMAN, MD  atorvastatin  (LIPITOR ) 80 MG tablet Take 1 tablet (80 mg total) by mouth daily at 6 PM. 07/06/15   Jerri Pfeiffer, MD  clobetasol  (TEMOVATE ) 0.05 % external solution Apply 1 Application topically as needed. 08/04/24   [provider]  clonazePAM  (KLONOPIN ) 1 MG tablet Take 0.5-1 mg  by mouth 2 (two) times daily as needed. 05/30/24   [provider]  clopidogrel  (PLAVIX ) 75 MG tablet Take 75 mg by mouth daily. 06/15/24   [provider]  cyanocobalamin  1000 MCG tablet Take 1 tablet (1,000 mcg total) by mouth daily. 03/18/24   Raenelle Coria, MD  DULoxetine  (CYMBALTA ) 30 MG capsule Take 30 mg by mouth daily. 05/05/21   [provider]  glipiZIDE  (GLUCOTROL  XL) 2.5 MG 24 hr tablet Take 1 tablet (2.5 mg total) by mouth daily with breakfast. 03/17/24   Raenelle Coria, MD  isosorbide  mononitrate (IMDUR ) 30 MG 24 hr tablet Take 30 mg by mouth daily. 03/07/24   [provider]  metFORMIN   (GLUCOPHAGE ) 500 MG tablet Take 1 tablet (500 mg total) by mouth 2 (two) times daily with a meal. 03/17/24   Raenelle Coria, MD  montelukast  (SINGULAIR ) 10 MG tablet Take 1 tablet (10 mg total) by mouth at bedtime. 05/29/21   Singh, Prashant K, MD  ondansetron  (ZOFRAN ) 4 MG tablet Take 4 mg by mouth every 8 (eight) hours as needed. 05/30/24   [provider]  [facility] VERIO test strip 1 each by Other route as needed. 06/07/24   [provider]  OZEMPIC, 0.25 OR 0.5 MG/DOSE, 2 MG/3ML SOPN SMARTSIG:0.25 Milligram(s) SUB-Q Once a Week 05/22/24   [provider]  pantoprazole  (PROTONIX ) 40 MG tablet Take 30-60 min before first meal of the day Patient taking differently: Take 40 mg by mouth 2 (two) times daily. Take 30-60 min before first meal of the day 12/23/22   Darlean Ozell NOVAK, MD    Current Outpatient Medications  Medication Sig Dispense Refill   albuterol  (VENTOLIN  HFA) 108 (90 Base) MCG/ACT inhaler Inhale 2 puffs into the lungs every 6 (six) hours as needed. (Patient taking differently: Inhale 2 puffs into the lungs every 6 (six) hours as needed for wheezing or shortness of breath.) 8 g 0   amLODipine  (NORVASC ) 5 MG tablet Take 5 mg by mouth daily.     aspirin  EC 81 MG tablet Take 1 tablet (81 mg total) by mouth daily. Swallow whole. 30 tablet 12   atorvastatin  (LIPITOR ) 80 MG tablet Take 1 tablet (80 mg total) by mouth daily at 6 PM. 30 tablet 3   clobetasol  (TEMOVATE ) 0.05 % external solution Apply 1 Application topically as needed.     clonazePAM  (KLONOPIN ) 1 MG tablet Take 0.5-1 mg by mouth 2 (two) times daily as needed.     clopidogrel  (PLAVIX ) 75 MG tablet Take 75 mg by mouth daily.     cyanocobalamin  1000 MCG tablet Take 1 tablet (1,000 mcg total) by mouth daily. 30 tablet 2   DULoxetine  (CYMBALTA ) 30 MG capsule Take 30 mg by mouth daily.     glipiZIDE  (GLUCOTROL  XL) 2.5 MG 24 hr tablet Take 1 tablet (2.5 mg total) by mouth daily with breakfast. 30 tablet 2    isosorbide  mononitrate (IMDUR ) 30 MG 24 hr tablet Take 30 mg by mouth daily.     metFORMIN  (GLUCOPHAGE ) 500 MG tablet Take 1 tablet (500 mg total) by mouth 2 (two) times daily with a meal. 60 tablet 2   montelukast  (SINGULAIR ) 10 MG tablet Take 1 tablet (10 mg total) by mouth at bedtime. 30 tablet 0   ondansetron  (ZOFRAN ) 4 MG tablet Take 4 mg by mouth every 8 (eight) hours as needed.     ONETOUCH VERIO test strip 1 each by Other route as needed.     OZEMPIC, 0.25 OR  0.5 MG/DOSE, 2 MG/3ML SOPN SMARTSIG:0.25 Milligram(s) SUB-Q Once a Week     pantoprazole  (PROTONIX ) 40 MG tablet Take 30-60 min before first meal of the day (Patient taking differently: Take 40 mg by mouth 2 (two) times daily. Take 30-60 min before first meal of the day)     Current Facility-Administered Medications  Medication Dose Route Frequency Provider Last Rate Last Admin   0.9 %  sodium chloride  infusion  500 mL Intravenous Once Jasamine Pottinger V, DO        Allergies as of 09/27/2024 - Review Complete 09/27/2024  Allergen Reaction Noted   Duloxetine  Other (See Comments) 10/11/2023   Ciprofloxacin  Nausea And Vomiting 07/13/2021   Codeine Nausea And Vomiting 03/24/2014   Doxycycline  Nausea And Vomiting 11/28/2007   Oxycodone -acetaminophen  Nausea And Vomiting 03/02/2014   Tramadol  Nausea And Vomiting 03/24/2014    Family History  Problem Relation Age of Onset   Heart attack Mother    Heart disease Mother    Hypertension Mother    Asthma Mother    Heart attack Father    Heart disease Father    Hypertension Sister    Heart attack Sister    Colon cancer Sister 73   Hypertension Sister    Heart attack Sister    Heart attack Sister    Lung disease Daughter    Thyroid  disease Daughter    Neuropathy Neg Hx    Stroke Neg Hx     Social History   Socioeconomic History   Marital status: Married    Spouse name: Tommy    Number of children: 2   Years of education: 12+   Highest education level: Not on file   Occupational History   Occupation: retired  Tobacco Use   Smoking status: Never   Smokeless tobacco: Never  Vaping Use   Vaping status: Never Used  Substance and Sexual Activity   Alcohol use: No    Alcohol/week: 0.0 standard drinks of alcohol   Drug use: No   Sexual activity: Not Currently  Other Topics Concern   Not on file  Social History Narrative   Right handed, caffeine  2 cups daily, married, 4 kids, Retired.  [employer].    Social Drivers of Health   Tobacco Use: Low Risk (09/27/2024)   Patient History    Smoking Tobacco Use: Never    Smokeless Tobacco Use: Never    Passive Exposure: Not on file  Financial Resource Strain: Not on file  Food Insecurity: No Food Insecurity (03/17/2024)   Epic    Worried About Programme Researcher, Broadcasting/film/video in the Last Year: Never true    Ran Out of Food in the Last Year: Never true  Transportation Needs: No Transportation Needs (03/17/2024)   Epic    Lack of Transportation (Medical): No    Lack of Transportation (Non-Medical): No  Physical Activity: Not on file  Stress: Not on file  Social Connections: Moderately Integrated (03/17/2024)   Social Connection and Isolation Panel    Frequency of Communication with Friends and Family: More than three times a week    Frequency of Social Gatherings with Friends and Family: More than three times a week    Attends Religious Services: Never    Database Administrator or Organizations: No    Attends Banker Meetings: 1 to 4 times per year    Marital Status: Married  Catering Manager Violence: Not At Risk (03/17/2024)   Epic    Fear of Current or  Ex-Partner: No    Emotionally Abused: No    Physically Abused: No    Sexually Abused: No  Depression (PHQ2-9): Not on file  Alcohol Screen: Not on file  Housing: Unknown (05/07/2024)   Received from [facility]   Epic    Unable to Pay for Housing in the Last Year: Not on file    Number of Times Moved in the Last Year: Not on file     At any time in the past 12 months, were you homeless or living in a shelter (including now)?: No  Utilities: Not At Risk (03/17/2024)   Epic    Threatened with loss of utilities: No  Recent Concern: Utilities - At Risk (02/15/2024)   [facility] Utilities    Threatened with loss of utilities: Yes  Health Literacy: Not on file    Physical Exam: Vital signs in last 24 hours: @BP  (!) 196/62   Pulse 93   Temp (!) 97.4 F (36.3 C) (Skin)   Ht 5' 4 (1.626 m)   Wt 153 lb (69.4 kg)   SpO2 98%   BMI 26.26 kg/m  GEN: NAD EYE: Sclerae anicteric ENT: MMM CV: Non-tachycardic Pulm: CTA b/l GI: Soft, NT/ND NEURO:  Alert & Oriented x 3   Sandor Flatter, DO [facility]   09/27/2024 10:18 AM  "

## 2024-09-27 NOTE — Patient Instructions (Signed)
-   Resume previous diet. - Continue present medications. - Await pathology results. - Resume Plavix  (clopidogrel ) at prior dose tomorrow. - Follow-up with Dr. Federico in the GI clinic as needed.  YOU HAD AN ENDOSCOPIC PROCEDURE TODAY AT THE [facility]:   Refer to the procedure report that was given to you for any specific questions about what was found during the examination.  If the procedure report does not answer your questions, please call your gastroenterologist to clarify.  If you requested that your care partner not be given the details of your procedure findings, then the procedure report has been included in a sealed envelope for you to review at your convenience later.  YOU SHOULD EXPECT: Some feelings of bloating in the abdomen. Passage of more gas than usual.  Walking can help get rid of the air that was put into your GI tract during the procedure and reduce the bloating. If you had a lower endoscopy (such as a colonoscopy or flexible sigmoidoscopy) you may notice spotting of blood in your stool or on the toilet paper. If you underwent a bowel prep for your procedure, you may not have a normal bowel movement for a few days.  Please Note:  You might notice some irritation and congestion in your nose or some drainage.  This is from the oxygen used during your procedure.  There is no need for concern and it should clear up in a day or so.  SYMPTOMS TO REPORT IMMEDIATELY: Following upper endoscopy (EGD)  Vomiting of blood or coffee ground material  New chest pain or pain under the shoulder blades  Painful or persistently difficult swallowing  New shortness of breath  Fever of 100F or higher  Black, tarry-looking stools  For urgent or emergent issues, a gastroenterologist can be reached at any hour by calling [phone]. Do not use MyChart messaging for urgent concerns.    DIET:  We do recommend a small meal at first, but then you may proceed to your regular diet.   Drink plenty of fluids but you should avoid alcoholic beverages for 24 hours.  ACTIVITY:  You should plan to take it easy for the rest of today and you should NOT DRIVE or use heavy machinery until tomorrow (because of the sedation medicines used during the test).    FOLLOW UP: Our staff will call the number listed on your records the next business day following your procedure.  We will call around 7:15- 8:00 am to check on you and address any questions or concerns that you may have regarding the information given to you following your procedure. If we do not reach you, we will leave a message.     If any biopsies were taken you will be contacted by phone or by letter within the next 1-3 weeks.  Please call us  at [phone] if you have not heard about the biopsies in 3 weeks.    SIGNATURES/CONFIDENTIALITY: You and/or your care partner have signed paperwork which will be entered into your electronic medical record.  These signatures attest to the fact that that the information above on your After Visit Summary has been reviewed and is understood.  Full responsibility of the confidentiality of this discharge information lies with you and/or your care-partner.

## 2024-09-27 NOTE — Progress Notes (Signed)
 1042 BP 184/100, Labetalol given IV, MD update, vss

## 2024-09-27 NOTE — Progress Notes (Signed)
 Called to room to assist during endoscopic procedure.  Patient ID and intended procedure confirmed with present staff. Received instructions for my participation in the procedure from the performing physician.

## 2024-09-27 NOTE — Progress Notes (Signed)
 Report given to PACU, vss

## 2024-09-27 NOTE — Op Note (Signed)
 [facility] Patient Name: Robbye Dede Procedure Date: 09/27/2024 10:10 AM MRN: [id number] Endoscopist: Sandor Flatter , MD, [id number] Age: 84 Referring MD:  Date of Birth: Oct 19, 1940 Gender: Female Account #: [id number] Procedure:                Upper GI endoscopy Indications:              Esophageal reflux, Nausea, Change in bowel habits Medicines:                Monitored Anesthesia Care Procedure:                Pre-Anesthesia Assessment:                           - Prior to the procedure, a History and Physical                            was performed, and patient medications and                            allergies were reviewed. The patient's tolerance of                            previous anesthesia was also reviewed. The risks                            and benefits of the procedure and the sedation                            options and risks were discussed with the patient.                            All questions were answered, and informed consent                            was obtained. Prior Anticoagulants: The patient has                            taken Plavix  (clopidogrel ), last dose was 5 days                            prior to procedure. ASA Grade Assessment: III - A                            patient with severe systemic disease. After                            reviewing the risks and benefits, the patient was                            deemed in satisfactory condition to undergo the                            procedure.  After obtaining informed consent, the endoscope was                            passed under direct vision. Throughout the                            procedure, the patient's blood pressure, pulse, and                            oxygen saturations were monitored continuously. The                            Olympus Scope [id number] was introduced through the                            mouth, and advanced to the  second part of duodenum.                            The upper GI endoscopy was accomplished without                            difficulty. The patient tolerated the procedure                            well. Scope In: Scope Out: Findings:                 The examined esophagus was normal.                           Multiple small sessile polyps with no bleeding and                            no stigmata of recent bleeding were found in the                            gastric fundus and in the gastric body. Several of                            these polyps were removed with a cold biopsy                            forceps. Resection and retrieval were complete.                            Estimated blood loss was minimal.                           Scattered mild inflammation characterized by                            congestion (edema) and erythema was found in the                            gastric body,  at the incisura and in the gastric                            antrum. Biopsies were taken with a cold forceps for                            Helicobacter pylori testing. Estimated blood loss                            was minimal.                           The examined duodenum was normal. Biopsies were                            taken with a cold forceps for histology. Estimated                            blood loss was minimal. Complications:            No immediate complications. Estimated Blood Loss:     Estimated blood loss was minimal. Impression:               - Normal esophagus.                           - Multiple gastric polyps. Resected and retrieved.                           - Gastritis. Biopsied.                           - Normal examined duodenum. Biopsied. Recommendation:           - Patient has a contact number available for                            emergencies. The signs and symptoms of potential                            delayed complications were discussed with  the                            patient. Return to normal activities tomorrow.                            Written discharge instructions were provided to the                            patient.                           - Resume previous diet.                           - Continue present medications.                           -  Await pathology results.                           - Resume Plavix  (clopidogrel ) at prior dose                            tomorrow.                           - Follow-up with Dr. Federico in the GI clinic as                            needed. Sandor Flatter, MD 09/27/2024 10:50:27 AM

## 2024-09-27 NOTE — Progress Notes (Signed)
 Pt's states no medical or surgical changes since previsit or office visit.

## 2024-09-27 NOTE — Progress Notes (Signed)
1033 Robinul 0.1 mg IV given due large amount of secretions upon assessment.  MD made aware, vss 

## 2024-09-30 ENCOUNTER — Telehealth: Payer: Self-pay

## 2024-09-30 NOTE — Telephone Encounter (Signed)
" °  Follow up Call-     09/27/2024   10:14 AM 02/24/2022   12:48 PM  Call back number  Post procedure Call Back phone  # [phone]  Permission to leave phone message Yes Yes     Patient questions:  Do you have a fever, pain , or abdominal swelling? No. Pain Score  0 *  Have you tolerated food without any problems? Yes.    Have you been able to return to your normal activities? Yes.    Do you have any questions about your discharge instructions: Diet   No. Medications  No. Follow up visit  No.  Do you have questions or concerns about your Care? No.  Actions: * If pain score is 4 or above: No action needed, pain <4.   "

## 2024-10-01 ENCOUNTER — Ambulatory Visit: Payer: Self-pay | Admitting: Gastroenterology

## 2024-10-01 DIAGNOSIS — R159 Full incontinence of feces: Secondary | ICD-10-CM

## 2024-10-01 DIAGNOSIS — K5902 Outlet dysfunction constipation: Secondary | ICD-10-CM

## 2024-10-01 LAB — SURGICAL PATHOLOGY

## 2024-10-08 ENCOUNTER — Telehealth: Payer: Self-pay | Admitting: Gastroenterology

## 2024-10-08 NOTE — Telephone Encounter (Signed)
 Patient is requesting a call to discuss recent endoscopy results. Please advise, thank you

## 2024-10-08 NOTE — Telephone Encounter (Signed)
 Pt made aware of Dr. Federico recommendations. Pt was scheduled to see Dr. Federico on 10/16/2024 at 1:30 PM. Pt made aware.  Pt verbalized understanding with all questions answered.

## 2024-10-08 NOTE — Telephone Encounter (Signed)
 Pt requested recent results from EGD. Pt was provided results from the path that Dr. San had wrote to the pt. Pt verbalized understanding with all questions answered.  Pt stated that she is still having nausea symptoms about every other day and takes a Zofran  and it goes away.  Pt requesting why she continues to have the ongoing nausea.  Please review and advise.

## 2024-10-16 ENCOUNTER — Ambulatory Visit: Admitting: Internal Medicine

## 2024-11-19 ENCOUNTER — Ambulatory Visit: Admitting: Physical Therapy

## 2025-01-01 ENCOUNTER — Ambulatory Visit: Admitting: Adult Health
